# Patient Record
Sex: Male | Born: 1935 | Race: White | Hispanic: No | Marital: Married | Smoking: Former smoker
Health system: Southern US, Community
[De-identification: ages and names within clinical notes are randomized; demographics above are authoritative.]

## PROBLEM LIST (undated history)

## (undated) DIAGNOSIS — F32A Depression, unspecified: Secondary | ICD-10-CM

## (undated) DIAGNOSIS — B059 Measles without complication: Secondary | ICD-10-CM

## (undated) DIAGNOSIS — D649 Anemia, unspecified: Secondary | ICD-10-CM

## (undated) DIAGNOSIS — R55 Syncope and collapse: Secondary | ICD-10-CM

## (undated) DIAGNOSIS — B269 Mumps without complication: Secondary | ICD-10-CM

## (undated) DIAGNOSIS — E669 Obesity, unspecified: Secondary | ICD-10-CM

## (undated) DIAGNOSIS — K759 Inflammatory liver disease, unspecified: Secondary | ICD-10-CM

## (undated) DIAGNOSIS — R7302 Impaired glucose tolerance (oral): Secondary | ICD-10-CM

## (undated) DIAGNOSIS — G4730 Sleep apnea, unspecified: Secondary | ICD-10-CM

## (undated) DIAGNOSIS — J439 Emphysema, unspecified: Secondary | ICD-10-CM

## (undated) DIAGNOSIS — Z8719 Personal history of other diseases of the digestive system: Secondary | ICD-10-CM

## (undated) DIAGNOSIS — J90 Pleural effusion, not elsewhere classified: Secondary | ICD-10-CM

## (undated) DIAGNOSIS — H9190 Unspecified hearing loss, unspecified ear: Secondary | ICD-10-CM

## (undated) DIAGNOSIS — B019 Varicella without complication: Secondary | ICD-10-CM

## (undated) DIAGNOSIS — R011 Cardiac murmur, unspecified: Secondary | ICD-10-CM

## (undated) DIAGNOSIS — E039 Hypothyroidism, unspecified: Secondary | ICD-10-CM

## (undated) DIAGNOSIS — H409 Unspecified glaucoma: Secondary | ICD-10-CM

## (undated) DIAGNOSIS — I4892 Unspecified atrial flutter: Secondary | ICD-10-CM

## (undated) DIAGNOSIS — F329 Major depressive disorder, single episode, unspecified: Secondary | ICD-10-CM

## (undated) DIAGNOSIS — E1169 Type 2 diabetes mellitus with other specified complication: Secondary | ICD-10-CM

## (undated) DIAGNOSIS — I10 Essential (primary) hypertension: Secondary | ICD-10-CM

## (undated) DIAGNOSIS — T4145XA Adverse effect of unspecified anesthetic, initial encounter: Secondary | ICD-10-CM

## (undated) DIAGNOSIS — E559 Vitamin D deficiency, unspecified: Secondary | ICD-10-CM

## (undated) DIAGNOSIS — R001 Bradycardia, unspecified: Secondary | ICD-10-CM

## (undated) DIAGNOSIS — F419 Anxiety disorder, unspecified: Secondary | ICD-10-CM

## (undated) DIAGNOSIS — C801 Malignant (primary) neoplasm, unspecified: Secondary | ICD-10-CM

## (undated) DIAGNOSIS — I5032 Chronic diastolic (congestive) heart failure: Secondary | ICD-10-CM

## (undated) DIAGNOSIS — Z0000 Encounter for general adult medical examination without abnormal findings: Secondary | ICD-10-CM

## (undated) DIAGNOSIS — M1990 Unspecified osteoarthritis, unspecified site: Secondary | ICD-10-CM

## (undated) DIAGNOSIS — K219 Gastro-esophageal reflux disease without esophagitis: Secondary | ICD-10-CM

## (undated) DIAGNOSIS — I2510 Atherosclerotic heart disease of native coronary artery without angina pectoris: Secondary | ICD-10-CM

## (undated) DIAGNOSIS — Z8619 Personal history of other infectious and parasitic diseases: Secondary | ICD-10-CM

## (undated) DIAGNOSIS — I499 Cardiac arrhythmia, unspecified: Secondary | ICD-10-CM

## (undated) DIAGNOSIS — I350 Nonrheumatic aortic (valve) stenosis: Secondary | ICD-10-CM

## (undated) HISTORY — DX: Encounter for general adult medical examination without abnormal findings: Z00.00

## (undated) HISTORY — DX: Unspecified atrial flutter: I48.92

## (undated) HISTORY — PX: EYE SURGERY: SHX253

## (undated) HISTORY — PX: TONSILLECTOMY: SUR1361

## (undated) HISTORY — DX: Hypothyroidism, unspecified: E03.9

## (undated) HISTORY — DX: Nonrheumatic aortic (valve) stenosis: I35.0

## (undated) HISTORY — DX: Pleural effusion, not elsewhere classified: J90

## (undated) HISTORY — PX: JOINT REPLACEMENT: SHX530

## (undated) HISTORY — PX: UPPER GI ENDOSCOPY: SHX6162

## (undated) HISTORY — DX: Mumps without complication: B26.9

## (undated) HISTORY — PX: TOTAL KNEE ARTHROPLASTY: SHX125

## (undated) HISTORY — DX: Anemia, unspecified: D64.9

## (undated) HISTORY — DX: Sleep apnea, unspecified: G47.30

## (undated) HISTORY — DX: Type 2 diabetes mellitus with other specified complication: E11.69

## (undated) HISTORY — DX: Emphysema, unspecified: J43.9

## (undated) HISTORY — PX: CATARACT EXTRACTION W/ INTRAOCULAR LENS  IMPLANT, BILATERAL: SHX1307

## (undated) HISTORY — DX: Obesity, unspecified: E66.9

## (undated) HISTORY — PX: CARDIAC CATHETERIZATION: SHX172

## (undated) HISTORY — DX: Vitamin D deficiency, unspecified: E55.9

## (undated) HISTORY — DX: Impaired glucose tolerance (oral): R73.02

## (undated) HISTORY — PX: WISDOM TOOTH EXTRACTION: SHX21

## (undated) HISTORY — DX: Unspecified osteoarthritis, unspecified site: M19.90

## (undated) HISTORY — DX: Syncope and collapse: R55

## (undated) HISTORY — DX: Chronic diastolic (congestive) heart failure: I50.32

## (undated) HISTORY — DX: Gastro-esophageal reflux disease without esophagitis: K21.9

## (undated) HISTORY — PX: FOOT TENDON SURGERY: SHX958

## (undated) HISTORY — DX: Personal history of other infectious and parasitic diseases: Z86.19

## (undated) HISTORY — PX: CARDIAC VALVE REPLACEMENT: SHX585

## (undated) HISTORY — DX: Varicella without complication: B01.9

## (undated) HISTORY — DX: Bradycardia, unspecified: R00.1

## (undated) HISTORY — DX: Measles without complication: B05.9

## (undated) HISTORY — PX: COLONOSCOPY: SHX174

---
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
## 1988-11-30 HISTORY — PX: TOTAL KNEE ARTHROPLASTY: SHX125

## 1999-02-20 ENCOUNTER — Ambulatory Visit (HOSPITAL_COMMUNITY): Admission: RE | Admit: 1999-02-20 | Discharge: 1999-02-20 | Payer: Self-pay | Admitting: Gastroenterology

## 2001-05-14 ENCOUNTER — Encounter: Payer: Self-pay | Admitting: Urology

## 2001-05-14 ENCOUNTER — Encounter: Admission: RE | Admit: 2001-05-14 | Discharge: 2001-05-14 | Payer: Self-pay | Admitting: Urology

## 2001-07-09 ENCOUNTER — Encounter: Payer: Self-pay | Admitting: Orthopedic Surgery

## 2001-07-13 ENCOUNTER — Inpatient Hospital Stay (HOSPITAL_COMMUNITY): Admission: RE | Admit: 2001-07-13 | Discharge: 2001-07-16 | Payer: Self-pay | Admitting: Orthopedic Surgery

## 2004-03-13 ENCOUNTER — Ambulatory Visit: Payer: Self-pay | Admitting: Internal Medicine

## 2004-06-05 ENCOUNTER — Ambulatory Visit: Payer: Self-pay | Admitting: Internal Medicine

## 2004-06-12 ENCOUNTER — Ambulatory Visit: Payer: Self-pay | Admitting: Internal Medicine

## 2004-08-13 ENCOUNTER — Ambulatory Visit: Payer: Self-pay | Admitting: Internal Medicine

## 2005-05-10 ENCOUNTER — Ambulatory Visit: Payer: Self-pay | Admitting: Internal Medicine

## 2005-06-13 ENCOUNTER — Ambulatory Visit: Payer: Self-pay | Admitting: Internal Medicine

## 2005-07-01 ENCOUNTER — Ambulatory Visit: Payer: Self-pay | Admitting: Internal Medicine

## 2005-07-01 ENCOUNTER — Ambulatory Visit: Payer: Self-pay

## 2005-07-22 ENCOUNTER — Ambulatory Visit: Payer: Self-pay | Admitting: Internal Medicine

## 2005-07-23 ENCOUNTER — Ambulatory Visit: Payer: Self-pay | Admitting: Internal Medicine

## 2005-07-24 ENCOUNTER — Ambulatory Visit: Payer: Self-pay

## 2005-07-30 ENCOUNTER — Ambulatory Visit: Payer: Self-pay | Admitting: Internal Medicine

## 2005-08-06 ENCOUNTER — Ambulatory Visit: Payer: Self-pay | Admitting: Internal Medicine

## 2005-08-19 ENCOUNTER — Ambulatory Visit: Payer: Self-pay | Admitting: Internal Medicine

## 2005-08-23 ENCOUNTER — Ambulatory Visit: Payer: Self-pay

## 2005-08-23 ENCOUNTER — Encounter: Payer: Self-pay | Admitting: Cardiology

## 2005-09-16 ENCOUNTER — Ambulatory Visit: Payer: Self-pay | Admitting: Internal Medicine

## 2005-10-15 ENCOUNTER — Ambulatory Visit: Payer: Self-pay | Admitting: Internal Medicine

## 2005-10-22 ENCOUNTER — Ambulatory Visit: Payer: Self-pay | Admitting: Internal Medicine

## 2005-11-12 ENCOUNTER — Ambulatory Visit: Payer: Self-pay | Admitting: Internal Medicine

## 2005-11-29 ENCOUNTER — Ambulatory Visit: Payer: Self-pay | Admitting: Internal Medicine

## 2005-12-23 ENCOUNTER — Ambulatory Visit: Payer: Self-pay | Admitting: Internal Medicine

## 2005-12-24 ENCOUNTER — Ambulatory Visit: Payer: Self-pay | Admitting: Internal Medicine

## 2005-12-30 ENCOUNTER — Ambulatory Visit: Payer: Self-pay | Admitting: Internal Medicine

## 2005-12-30 ENCOUNTER — Ambulatory Visit: Payer: Self-pay

## 2006-01-15 ENCOUNTER — Ambulatory Visit: Payer: Self-pay | Admitting: Emergency Medicine

## 2006-02-24 ENCOUNTER — Ambulatory Visit: Payer: Self-pay | Admitting: Internal Medicine

## 2006-02-24 LAB — CONVERTED CEMR LAB
ALT: 43 units/L — ABNORMAL HIGH (ref 0–40)
AST: 50 units/L — ABNORMAL HIGH (ref 0–37)
Albumin: 4.1 g/dL (ref 3.5–5.2)
Alkaline Phosphatase: 33 units/L — ABNORMAL LOW (ref 39–117)
Bilirubin, Direct: 0.1 mg/dL (ref 0.0–0.3)
Chol/HDL Ratio, serum: 3.7
Cholesterol: 224 mg/dL (ref 0–200)
HDL: 60.3 mg/dL (ref >39.0)
LDL DIRECT: 148.9 mg/dL
TSH: 3.09 microintl units/mL (ref 0.35–5.50)
Total Bilirubin: 0.8 mg/dL (ref 0.3–1.2)
Total Protein: 7.3 g/dL (ref 6.0–8.3)
Triglyceride fasting, serum: 55 mg/dL (ref 0–149)
VLDL: 11 mg/dL (ref 0–40)

## 2006-02-27 ENCOUNTER — Ambulatory Visit (HOSPITAL_BASED_OUTPATIENT_CLINIC_OR_DEPARTMENT_OTHER): Admission: RE | Admit: 2006-02-27 | Discharge: 2006-02-27 | Payer: Self-pay | Admitting: Emergency Medicine

## 2006-03-11 ENCOUNTER — Ambulatory Visit: Payer: Self-pay | Admitting: Emergency Medicine

## 2006-03-20 ENCOUNTER — Ambulatory Visit: Payer: Self-pay | Admitting: Pulmonary Disease

## 2006-04-11 ENCOUNTER — Ambulatory Visit: Payer: Self-pay | Admitting: Internal Medicine

## 2006-05-02 ENCOUNTER — Ambulatory Visit: Payer: Self-pay | Admitting: Internal Medicine

## 2006-05-05 ENCOUNTER — Ambulatory Visit: Payer: Self-pay | Admitting: Internal Medicine

## 2006-05-06 ENCOUNTER — Ambulatory Visit: Payer: Self-pay | Admitting: Emergency Medicine

## 2006-06-25 ENCOUNTER — Ambulatory Visit: Payer: Self-pay | Admitting: Internal Medicine

## 2006-07-14 ENCOUNTER — Ambulatory Visit: Payer: Self-pay | Admitting: Internal Medicine

## 2006-08-15 ENCOUNTER — Ambulatory Visit: Payer: Self-pay | Admitting: Cardiology

## 2006-09-03 ENCOUNTER — Ambulatory Visit: Payer: Self-pay

## 2006-09-03 ENCOUNTER — Ambulatory Visit: Payer: Self-pay | Admitting: Internal Medicine

## 2006-09-03 ENCOUNTER — Encounter: Payer: Self-pay | Admitting: Internal Medicine

## 2006-09-03 LAB — CONVERTED CEMR LAB
ALT: 35 units/L (ref 0–40)
AST: 43 units/L — ABNORMAL HIGH (ref 0–37)
Albumin: 3.7 g/dL (ref 3.5–5.2)
Alkaline Phosphatase: 44 units/L (ref 39–117)
Bilirubin, Direct: 0.1 mg/dL (ref 0.0–0.3)
CK-MB: 13.4 ng/mL (ref 0.3–4.0)
Total Bilirubin: 0.7 mg/dL (ref 0.3–1.2)
Total CK: 404 units/L (ref 7–195)
Total Protein: 6.6 g/dL (ref 6.0–8.3)

## 2006-09-23 DIAGNOSIS — E785 Hyperlipidemia, unspecified: Secondary | ICD-10-CM | POA: Insufficient documentation

## 2006-09-23 DIAGNOSIS — I10 Essential (primary) hypertension: Secondary | ICD-10-CM | POA: Insufficient documentation

## 2006-09-23 DIAGNOSIS — E039 Hypothyroidism, unspecified: Secondary | ICD-10-CM | POA: Insufficient documentation

## 2006-09-23 DIAGNOSIS — K219 Gastro-esophageal reflux disease without esophagitis: Secondary | ICD-10-CM | POA: Insufficient documentation

## 2006-09-23 DIAGNOSIS — F418 Other specified anxiety disorders: Secondary | ICD-10-CM | POA: Insufficient documentation

## 2006-09-26 ENCOUNTER — Ambulatory Visit: Payer: Self-pay | Admitting: Internal Medicine

## 2006-10-28 ENCOUNTER — Ambulatory Visit: Payer: Self-pay | Admitting: Internal Medicine

## 2007-01-21 ENCOUNTER — Ambulatory Visit: Payer: Self-pay

## 2007-04-09 ENCOUNTER — Ambulatory Visit: Payer: Self-pay | Admitting: Internal Medicine

## 2007-05-04 ENCOUNTER — Ambulatory Visit: Payer: Self-pay | Admitting: Internal Medicine

## 2007-05-04 LAB — CONVERTED CEMR LAB
ALT: 32 units/L (ref 0–53)
AST: 34 units/L (ref 0–37)
Albumin: 3.9 g/dL (ref 3.5–5.2)
Alkaline Phosphatase: 36 units/L — ABNORMAL LOW (ref 39–117)
BUN: 16 mg/dL (ref 6–23)
Bilirubin, Direct: 0.2 mg/dL (ref 0.0–0.3)
CO2: 31 meq/L (ref 19–32)
CRP, High Sensitivity: 1 (ref 0.00–5.00)
Calcium: 9 mg/dL (ref 8.4–10.5)
Chloride: 104 meq/L (ref 96–112)
Cholesterol: 170 mg/dL (ref 0–200)
Creatinine, Ser: 1 mg/dL (ref 0.4–1.5)
GFR calc Af Amer: 95 mL/min
GFR calc non Af Amer: 79 mL/min
Glucose, Bld: 107 mg/dL — ABNORMAL HIGH (ref 70–99)
HDL: 60.8 mg/dL (ref >39.0)
LDL Cholesterol: 99 mg/dL (ref 0–99)
Potassium: 3.9 meq/L (ref 3.5–5.1)
Sodium: 141 meq/L (ref 135–145)
Total Bilirubin: 0.7 mg/dL (ref 0.3–1.2)
Total CHOL/HDL Ratio: 2.8
Total Protein: 6.7 g/dL (ref 6.0–8.3)
Triglycerides: 53 mg/dL (ref 0–149)
VLDL: 11 mg/dL (ref 0–40)

## 2007-08-17 ENCOUNTER — Ambulatory Visit: Payer: Self-pay | Admitting: Internal Medicine

## 2007-08-17 DIAGNOSIS — M1990 Unspecified osteoarthritis, unspecified site: Secondary | ICD-10-CM | POA: Insufficient documentation

## 2007-08-19 ENCOUNTER — Telehealth: Payer: Self-pay | Admitting: Internal Medicine

## 2008-02-15 ENCOUNTER — Ambulatory Visit: Payer: Self-pay | Admitting: Internal Medicine

## 2008-04-12 ENCOUNTER — Ambulatory Visit: Payer: Self-pay | Admitting: Internal Medicine

## 2008-04-12 LAB — CONVERTED CEMR LAB
ALT: 41 units/L (ref 0–53)
AST: 37 units/L (ref 0–37)
Albumin: 3.7 g/dL (ref 3.5–5.2)
Alkaline Phosphatase: 49 units/L (ref 39–117)
Bilirubin, Direct: 0.1 mg/dL (ref 0.0–0.3)
Cholesterol: 192 mg/dL (ref 0–200)
HDL: 56.2 mg/dL (ref >39.0)
LDL Cholesterol: 122 mg/dL — ABNORMAL HIGH (ref 0–99)
Total Bilirubin: 0.6 mg/dL (ref 0.3–1.2)
Total CHOL/HDL Ratio: 3.4
Total Protein: 6.9 g/dL (ref 6.0–8.3)
Triglycerides: 70 mg/dL (ref 0–149)
VLDL: 14 mg/dL (ref 0–40)

## 2008-04-18 ENCOUNTER — Ambulatory Visit: Payer: Self-pay | Admitting: Internal Medicine

## 2008-04-18 LAB — CONVERTED CEMR LAB
Cholesterol, target level: 200 mg/dL
HDL goal, serum: 40 mg/dL
LDL Goal: 160 mg/dL

## 2008-05-24 ENCOUNTER — Ambulatory Visit: Payer: Self-pay | Admitting: Internal Medicine

## 2008-05-24 LAB — CONVERTED CEMR LAB
Free T4: 0.8 ng/dL (ref 0.6–1.6)
T3, Free: 3.4 pg/mL (ref 2.3–4.2)
TSH: 6.63 microintl units/mL — ABNORMAL HIGH (ref 0.35–5.50)

## 2008-05-26 ENCOUNTER — Ambulatory Visit: Payer: Self-pay

## 2008-05-26 ENCOUNTER — Ambulatory Visit: Payer: Self-pay | Admitting: Internal Medicine

## 2008-05-26 ENCOUNTER — Encounter: Payer: Self-pay | Admitting: Internal Medicine

## 2008-05-31 ENCOUNTER — Ambulatory Visit: Payer: Self-pay | Admitting: Internal Medicine

## 2008-06-07 ENCOUNTER — Ambulatory Visit: Payer: Self-pay | Admitting: Cardiology

## 2008-06-14 ENCOUNTER — Ambulatory Visit: Payer: Self-pay | Admitting: Internal Medicine

## 2008-06-14 ENCOUNTER — Telehealth: Payer: Self-pay | Admitting: Internal Medicine

## 2008-06-22 ENCOUNTER — Encounter: Payer: Self-pay | Admitting: Internal Medicine

## 2008-06-22 ENCOUNTER — Ambulatory Visit: Payer: Self-pay | Admitting: Internal Medicine

## 2008-06-29 ENCOUNTER — Ambulatory Visit: Payer: Self-pay | Admitting: Cardiology

## 2008-06-30 ENCOUNTER — Ambulatory Visit: Payer: Self-pay | Admitting: Internal Medicine

## 2008-06-30 HISTORY — PX: CARDIAC ELECTROPHYSIOLOGY MAPPING AND ABLATION: SHX1292

## 2008-06-30 LAB — CONVERTED CEMR LAB
BUN: 13 mg/dL (ref 6–23)
Basophils Absolute: 0 10*3/uL (ref 0.0–0.1)
Basophils Relative: 0.5 % (ref 0.0–3.0)
CO2: 29 meq/L (ref 19–32)
Calcium: 8.8 mg/dL (ref 8.4–10.5)
Chloride: 103 meq/L (ref 96–112)
Creatinine, Ser: 0.9 mg/dL (ref 0.4–1.5)
Eosinophils Absolute: 0.3 10*3/uL (ref 0.0–0.7)
Eosinophils Relative: 6.8 % — ABNORMAL HIGH (ref 0.0–5.0)
GFR calc non Af Amer: 88.4 mL/min (ref >60)
Glucose, Bld: 194 mg/dL — ABNORMAL HIGH (ref 70–99)
HCT: 37.7 % — ABNORMAL LOW (ref 39.0–52.0)
Hemoglobin: 13 g/dL (ref 13.0–17.0)
INR: 2.1 — ABNORMAL HIGH (ref 0.8–1.0)
Lymphocytes Relative: 34.4 % (ref 12.0–46.0)
Lymphs Abs: 1.7 10*3/uL (ref 0.7–4.0)
MCHC: 34.6 g/dL (ref 30.0–36.0)
MCV: 87.2 fL (ref 78.0–100.0)
Monocytes Absolute: 0.4 10*3/uL (ref 0.1–1.0)
Monocytes Relative: 7.6 % (ref 3.0–12.0)
Neutro Abs: 2.6 10*3/uL (ref 1.4–7.7)
Neutrophils Relative %: 50.7 % (ref 43.0–77.0)
Platelets: 174 10*3/uL (ref 150.0–400.0)
Potassium: 3.7 meq/L (ref 3.5–5.1)
Prothrombin Time: 21.5 s — ABNORMAL HIGH (ref 10.9–13.3)
RBC: 4.32 M/uL (ref 4.22–5.81)
RDW: 14.4 % (ref 11.5–14.6)
Sodium: 138 meq/L (ref 135–145)
WBC: 5 10*3/uL (ref 4.5–10.5)
aPTT: 33.6 s — ABNORMAL HIGH (ref 21.7–28.8)

## 2008-07-07 ENCOUNTER — Ambulatory Visit (HOSPITAL_COMMUNITY): Admission: RE | Admit: 2008-07-07 | Discharge: 2008-07-08 | Payer: Self-pay | Admitting: Internal Medicine

## 2008-07-07 ENCOUNTER — Ambulatory Visit: Payer: Self-pay | Admitting: Internal Medicine

## 2008-07-08 ENCOUNTER — Encounter: Payer: Self-pay | Admitting: Internal Medicine

## 2008-07-13 ENCOUNTER — Ambulatory Visit: Payer: Self-pay | Admitting: Cardiovascular Disease

## 2008-07-18 ENCOUNTER — Ambulatory Visit: Payer: Self-pay | Admitting: Internal Medicine

## 2008-08-10 ENCOUNTER — Ambulatory Visit: Payer: Self-pay | Admitting: Cardiology

## 2008-08-12 ENCOUNTER — Ambulatory Visit: Payer: Self-pay | Admitting: Internal Medicine

## 2008-08-12 LAB — CONVERTED CEMR LAB
ALT: 35 units/L (ref 0–53)
AST: 44 units/L — ABNORMAL HIGH (ref 0–37)
Albumin: 4 g/dL (ref 3.5–5.2)
Alkaline Phosphatase: 48 units/L (ref 39–117)
Bilirubin, Direct: 0.2 mg/dL (ref 0.0–0.3)
Cholesterol: 196 mg/dL (ref 0–200)
HDL: 57.2 mg/dL (ref >39.00)
LDL Cholesterol: 123 mg/dL — ABNORMAL HIGH (ref 0–99)
Total Bilirubin: 0.8 mg/dL (ref 0.3–1.2)
Total CHOL/HDL Ratio: 3
Total Protein: 7.3 g/dL (ref 6.0–8.3)
Triglycerides: 77 mg/dL (ref 0.0–149.0)
VLDL: 15.4 mg/dL (ref 0.0–40.0)

## 2008-08-17 ENCOUNTER — Ambulatory Visit: Payer: Self-pay | Admitting: Internal Medicine

## 2008-08-23 ENCOUNTER — Ambulatory Visit: Payer: Self-pay | Admitting: Internal Medicine

## 2008-08-29 DIAGNOSIS — G4733 Obstructive sleep apnea (adult) (pediatric): Secondary | ICD-10-CM | POA: Insufficient documentation

## 2008-08-30 ENCOUNTER — Ambulatory Visit: Payer: Self-pay | Admitting: Internal Medicine

## 2008-08-31 ENCOUNTER — Encounter: Payer: Self-pay | Admitting: *Deleted

## 2008-09-14 ENCOUNTER — Telehealth: Payer: Self-pay | Admitting: Internal Medicine

## 2008-09-14 LAB — CONVERTED CEMR LAB
Free T4: 0.9 ng/dL (ref 0.6–1.6)
TSH: 2.23 microintl units/mL (ref 0.35–5.50)

## 2008-10-05 ENCOUNTER — Encounter: Payer: Self-pay | Admitting: *Deleted

## 2008-10-11 ENCOUNTER — Ambulatory Visit: Payer: Self-pay | Admitting: Internal Medicine

## 2008-11-11 ENCOUNTER — Encounter: Payer: Self-pay | Admitting: Cardiology

## 2008-11-14 ENCOUNTER — Encounter (INDEPENDENT_AMBULATORY_CARE_PROVIDER_SITE_OTHER): Payer: Self-pay | Admitting: *Deleted

## 2008-11-22 ENCOUNTER — Ambulatory Visit: Payer: Self-pay | Admitting: Internal Medicine

## 2008-11-22 DIAGNOSIS — I4892 Unspecified atrial flutter: Secondary | ICD-10-CM

## 2008-12-06 ENCOUNTER — Encounter (INDEPENDENT_AMBULATORY_CARE_PROVIDER_SITE_OTHER): Payer: Self-pay | Admitting: *Deleted

## 2009-01-11 ENCOUNTER — Ambulatory Visit: Payer: Self-pay | Admitting: Internal Medicine

## 2009-03-09 ENCOUNTER — Encounter: Payer: Self-pay | Admitting: Internal Medicine

## 2009-03-13 ENCOUNTER — Ambulatory Visit: Payer: Self-pay | Admitting: Internal Medicine

## 2009-03-13 DIAGNOSIS — R001 Bradycardia, unspecified: Secondary | ICD-10-CM

## 2009-04-03 ENCOUNTER — Ambulatory Visit: Payer: Self-pay | Admitting: Internal Medicine

## 2009-04-03 LAB — CONVERTED CEMR LAB
ALT: 32 units/L (ref 0–53)
AST: 32 units/L (ref 0–37)
Alkaline Phosphatase: 47 units/L (ref 39–117)
BUN: 16 mg/dL (ref 6–23)
Basophils Relative: 0.8 % (ref 0.0–3.0)
Bilirubin, Direct: 0 mg/dL (ref 0.0–0.3)
Chloride: 105 meq/L (ref 96–112)
Creatinine, Ser: 0.9 mg/dL (ref 0.4–1.5)
Eosinophils Relative: 4.4 % (ref 0.0–5.0)
GFR calc non Af Amer: 88.21 mL/min (ref >60)
LDL Cholesterol: 116 mg/dL — ABNORMAL HIGH (ref 0–99)
MCV: 90.9 fL (ref 78.0–100.0)
Monocytes Absolute: 0.4 10*3/uL (ref 0.1–1.0)
Monocytes Relative: 7.3 % (ref 3.0–12.0)
Neutrophils Relative %: 60.1 % (ref 43.0–77.0)
Nitrite: NEGATIVE
Platelets: 182 10*3/uL (ref 150.0–400.0)
Potassium: 3.9 meq/L (ref 3.5–5.1)
Protein, U semiquant: NEGATIVE
RBC: 4.72 M/uL (ref 4.22–5.81)
Total Bilirubin: 0.8 mg/dL (ref 0.3–1.2)
Total CHOL/HDL Ratio: 3
Total Protein: 7.1 g/dL (ref 6.0–8.3)
Triglycerides: 76 mg/dL (ref 0.0–149.0)
Urobilinogen, UA: 0.2
VLDL: 15.2 mg/dL (ref 0.0–40.0)
WBC: 5.3 10*3/uL (ref 4.5–10.5)

## 2009-04-10 ENCOUNTER — Ambulatory Visit: Payer: Self-pay | Admitting: Internal Medicine

## 2009-04-10 DIAGNOSIS — E1169 Type 2 diabetes mellitus with other specified complication: Secondary | ICD-10-CM

## 2009-04-10 DIAGNOSIS — E669 Obesity, unspecified: Secondary | ICD-10-CM

## 2009-04-10 HISTORY — DX: Obesity, unspecified: E11.69

## 2009-04-10 HISTORY — DX: Obesity, unspecified: E66.9

## 2009-04-13 ENCOUNTER — Telehealth: Payer: Self-pay | Admitting: Internal Medicine

## 2009-07-04 ENCOUNTER — Ambulatory Visit: Payer: Self-pay | Admitting: Internal Medicine

## 2009-07-13 ENCOUNTER — Encounter (INDEPENDENT_AMBULATORY_CARE_PROVIDER_SITE_OTHER): Payer: Self-pay | Admitting: *Deleted

## 2009-07-13 ENCOUNTER — Ambulatory Visit: Payer: Self-pay | Admitting: Internal Medicine

## 2009-07-13 LAB — CONVERTED CEMR LAB
BUN: 20 mg/dL (ref 6–23)
CO2: 31 meq/L (ref 19–32)
Chloride: 102 meq/L (ref 96–112)
Cholesterol: 161 mg/dL (ref 0–200)
GFR calc non Af Amer: 69.92 mL/min (ref >60)
Glucose, Bld: 107 mg/dL — ABNORMAL HIGH (ref 70–99)
Potassium: 4 meq/L (ref 3.5–5.1)
Sodium: 142 meq/L (ref 135–145)
TSH: 1.09 microintl units/mL (ref 0.35–5.50)

## 2009-09-06 ENCOUNTER — Ambulatory Visit: Payer: Self-pay | Admitting: Internal Medicine

## 2009-10-11 ENCOUNTER — Encounter: Payer: Self-pay | Admitting: Internal Medicine

## 2009-10-12 ENCOUNTER — Ambulatory Visit: Payer: Self-pay | Admitting: Internal Medicine

## 2010-02-01 ENCOUNTER — Ambulatory Visit: Payer: Self-pay | Admitting: Internal Medicine

## 2010-02-01 LAB — CONVERTED CEMR LAB
ALT: 31 units/L (ref 0–53)
Albumin: 4.1 g/dL (ref 3.5–5.2)
Bilirubin, Direct: 0.1 mg/dL (ref 0.0–0.3)
Cholesterol: 224 mg/dL — ABNORMAL HIGH (ref 0–200)
HDL: 88.5 mg/dL (ref >39.00)
Hgb A1c MFr Bld: 6 % (ref 4.6–6.5)
Total Protein: 6.8 g/dL (ref 6.0–8.3)
VLDL: 13.6 mg/dL (ref 0.0–40.0)

## 2010-02-08 ENCOUNTER — Ambulatory Visit: Payer: Self-pay | Admitting: Internal Medicine

## 2010-04-29 LAB — CONVERTED CEMR LAB
ALT: 33 units/L (ref 0–53)
AST: 35 units/L (ref 0–37)
Albumin: 4.1 g/dL (ref 3.5–5.2)
Alkaline Phosphatase: 51 units/L (ref 39–117)
BUN: 21 mg/dL (ref 6–23)
Bilirubin, Direct: 0.1 mg/dL (ref 0.0–0.3)
CO2: 30 meq/L (ref 19–32)
Calcium: 9.3 mg/dL (ref 8.4–10.5)
Chloride: 102 meq/L (ref 96–112)
Cholesterol: 175 mg/dL (ref 0–200)
Creatinine, Ser: 1.1 mg/dL (ref 0.4–1.5)
GFR calc Af Amer: 85 mL/min
GFR calc non Af Amer: 71 mL/min
Glucose, Bld: 109 mg/dL — ABNORMAL HIGH (ref 70–99)
HDL: 50.1 mg/dL (ref >39.0)
LDL Cholesterol: 114 mg/dL — ABNORMAL HIGH (ref 0–99)
Potassium: 4.4 meq/L (ref 3.5–5.1)
Sodium: 139 meq/L (ref 135–145)
Total Bilirubin: 0.8 mg/dL (ref 0.3–1.2)
Total CHOL/HDL Ratio: 3.5
Total CK: 194 units/L (ref 7–195)
Total Protein: 6.8 g/dL (ref 6.0–8.3)
Triglycerides: 56 mg/dL (ref 0–149)
VLDL: 11 mg/dL (ref 0–40)

## 2010-05-01 NOTE — Assessment & Plan Note (Signed)
Summary: CPX//SLM   Vital Signs:  Patient profile:   75 year old male Height:      75 inches Weight:      270 pounds BMI:     33.87 Temp:     98.2 degrees F oral Pulse rate:   68 / minute Resp:     14 per minute BP sitting:   132 / 80  (left arm)  Vitals Entered By: Willy Eddy, LPN (April 10, 2009 9:00 AM) CC: annual visit for disease management   Primary Care Provider:  Stacie Glaze MD  CC:  annual visit for disease management.  History of Present Illness: has lost some weight has been dieting The pt was asked about all immunizations, health maint. services that are appropriate to their age and was given guidance on diet exercize  and weight management   Preventive Screening-Counseling & Management  Alcohol-Tobacco     Smoking Status: quit     Year Quit: 1980  Problems Prior to Update: 1)  Bradycardia  (ICD-427.89) 2)  Sleep Apnea  (ICD-780.57) 3)  Hx of Atrial Flutter  (ICD-427.32) 4)  Osteoarthritis  (ICD-715.90) 5)  Hypothyroidism  (ICD-244.9) 6)  Hypertension  (ICD-401.9) 7)  Hyperlipidemia  (ICD-272.4) 8)  Gerd  (ICD-530.81) 9)  Depression  (ICD-311)  Medications Prior to Update: 1)  Synthroid 137 Mcg Tabs (Levothyroxine Sodium) .[name].. 1 Once Daily 2)  Hydrochlorothiazide 12.5 Mg  Tabs (Hydrochlorothiazide) .... Once Daily 3)  Amlodipine Besylate 10 Mg Tabs (Amlodipine Besylate) .[name].. 1 Once Daily 4)  Omeprazole 20 Mg  Cpdr (Omeprazole) .... Once Daily 5)  Proscar 5 Mg  Tabs (Finasteride) .... Once Daily 6)  Fish Oil   Caps (Omega-3 Fatty Acids Caps) .... Once Daily 7)  Clonidine Hcl 0.1 Mg Tabs (Clonidine Hcl) .... Take One Tablet By Mouth Twice A Day 8)  Paroxetine Hcl 40 Mg Tabs (Paroxetine Hcl) .[name].. 1 Once Daily 9)  Diclofenac Sodium 75 Mg Tbec (Diclofenac Sodium) .[name].. 1 Two Times A Day 10)  Crestor 5 Mg Tabs (Rosuvastatin Calcium) .... M,w,f, 11)  Aspirin 81 Mg Tbec (Aspirin) .... Take One Tablet By Mouth Daily 12)  Benazepril Hcl 40 Mg Tabs  (Benazepril Hcl) .[name].. 1 Once Daily  Current Medications (verified): 1)  Synthroid 137 Mcg Tabs (Levothyroxine Sodium) .[name].. 1 Once Daily 2)  Hydrochlorothiazide 12.5 Mg  Tabs (Hydrochlorothiazide) .... Once Daily 3)  Amlodipine Besylate 10 Mg Tabs (Amlodipine Besylate) .[name].. 1 Once Daily 4)  Omeprazole 20 Mg  Cpdr (Omeprazole) .... Once Daily 5)  Proscar 5 Mg  Tabs (Finasteride) .... Once Daily 6)  Fish Oil   Caps (Omega-3 Fatty Acids Caps) .... Once Daily 7)  Clonidine Hcl 0.1 Mg Tabs (Clonidine Hcl) .... 1/2 Two Times A Day 8)  Paroxetine Hcl 40 Mg Tabs (Paroxetine Hcl) .[name].. 1 Once Daily 9)  Diclofenac Sodium 75 Mg Tbec (Diclofenac Sodium) .[name].. 1 Two Times A Day 10)  Crestor 5 Mg Tabs (Rosuvastatin Calcium) .... M,w,f, 11)  Aspirin 81 Mg Tbec (Aspirin) .... Take One Tablet By Mouth Daily 12)  Benazepril Hcl 40 Mg Tabs (Benazepril Hcl) .[name].. 1 Once Daily  Allergies (verified): 1)  ! Codeine 2)  ! * No Latex Allergy 3)  ! * No Ivp Dye Allergy 4)  ! * No Shellfish Allergy 5)  ! * Statins 6)  * Feldene Pyroxicam  Past History:  Family History: Last updated: 09/23/2006 Fam hx CHF Fam hx MS Family History Hypertension  Social History:  Last updated: 05/23/2008 Former Smoker Retired  Married  Alcohol Use - no Drug Use - no  Risk Factors: Smoking Status: quit (04/10/2009)  Past medical, surgical, family and social histories (including risk factors) reviewed, and no changes noted (except as noted below).  Past Medical History: Reviewed history from 08/30/2008 and no changes required. 1.Atrial flutter     --s/p ablation 07/17/2008    --echo: EF 60-65% trivial AI 2. Sleep apnea 3. Hyperlipidemia    --h/o myostitis with statins 4. Hypertension 5. Hypothyroidism 6. GERD 7. Osteoarthritis 8. Chronic dyspnea. 9. Bradycardia 10. Glucose intolerance 11. h/o micturition syncope 12. h/o negative Myoview 4/07  Family History: Reviewed history from 09/23/2006 and no changes  required. Fam hx CHF Fam hx MS Family History Hypertension  Social History: Reviewed history from 05/23/2008 and no changes required. Former Smoker Retired  Married  Alcohol Use - no Drug Use - no  Review of Systems  The patient denies anorexia, fever, weight loss, weight gain, vision loss, decreased hearing, hoarseness, chest pain, syncope, dyspnea on exertion, peripheral edema, prolonged cough, headaches, hemoptysis, abdominal pain, melena, hematochezia, severe indigestion/heartburn, hematuria, incontinence, genital sores, muscle weakness, suspicious skin lesions, transient blindness, difficulty walking, depression, unusual weight change, abnormal bleeding, enlarged lymph nodes, angioedema, breast masses, and testicular masses.    Physical Exam  General:  Gen: well appearing. no resp difficulty HEENT: normal Neck: supple. no JVD. Carotids 2+ bilat; not bruits. No lymphadenopathy or thryomegaly appreciated. Cor: PMI nondisplaced. Regular rate & rhythm. No rubs, gallops. 2/6 systolic murmur RSB Lungs: clear Abdomen: soft, nontender, nondistended. No hepatosplenomegaly. No bruits or masses. Good bowel sounds. Extremities: no cyanosis, clubbing, rash, edema Neuro: alert & orientedx3, cranial nerves grossly intact. moves all 4 extremities w/o difficulty. affect pleasant  Head:  normocephalic and atraumatic Ears:  R ear normal and L ear normal.   Nose:  no external deformity and no nasal discharge.   Mouth:  pharynx pink and moist and no erythema.   Neck:  No deformities, masses, or tenderness noted. Lungs:  normal respiratory effort and no wheezes.   Heart:  normal rate and regular rhythm.   Abdomen:  Bowel sounds positive; abdomen soft and non-tender without masses, organomegaly, or hernias noted. No hepatosplenomegaly. Msk:  normal ROM.   Extremities:  trace left pedal edema and trace right pedal edema.   Neurologic:  alert & oriented X3 and DTRs symmetrical and normal.   Skin:   Intact without suspicious lesions or rashes Cervical Nodes:  No lymphadenopathy noted Axillary Nodes:  No palpable lymphadenopathy Psych:  Cognition and judgment appear intact. Alert and cooperative with normal attention span and concentration. No apparent delusions, illusions, hallucinations   Impression & Recommendations:  Problem # 1:  PREVENTIVE HEALTH CARE (ICD-V70.0) new problem diabetes The pt was asked about all immunizations, health maint. services that are appropriate to their age and was given guidance on diet exercize  and weight management  Td Booster: Historical (04/30/2008)   Flu Vax: Fluvax 3+ (01/11/2009)   Pneumovax: Historical (10/29/2008) Chol: 195 (04/03/2009)   HDL: 63.90 (04/03/2009)   LDL: 116 (04/03/2009)   TG: 76.0 (04/03/2009) TSH: 1.85 (04/03/2009)   HgbA1C: 6.4 (08/23/2008)   PSA: 1.58 (04/03/2009)  Discussed using sunscreen, use of alcohol, drug use, self testicular exam, routine dental care, routine eye care, routine physical exam, seat belts, multiple vitamins, osteoporosis prevention, adequate calcium intake in diet, and recommendations for immunizations.  Discussed exercise and checking cholesterol.  Discussed gun safety, safe sex, and  contraception. Also recommend checking PSA.  Problem # 2:  IMPAIRED FASTING GLUCOSE (ICD-790.21)  Labs Reviewed: Creat: 0.9 (04/03/2009)     Orders: TLB-A1C / Hgb A1C (Glycohemoglobin) (83036-A1C)  Complete Medication List: 1)  Synthroid 137 Mcg Tabs (Levothyroxine sodium) .[name].. 1 once daily 2)  Hydrochlorothiazide 12.5 Mg Tabs (Hydrochlorothiazide) .... Once daily 3)  Amlodipine Besylate 10 Mg Tabs (Amlodipine besylate) .[name].. 1 once daily 4)  Omeprazole 20 Mg Cpdr (Omeprazole) .... Once daily 5)  Proscar 5 Mg Tabs (Finasteride) .... Once daily 6)  Fish Oil Caps (Omega-3 fatty acids caps) .... Once daily 7)  Clonidine Hcl 0.1 Mg Tabs (Clonidine hcl) .... 1/2 two times a day 8)  Paroxetine Hcl 40 Mg Tabs (Paroxetine hcl)  .[name].. 1 once daily 9)  Diclofenac Sodium 75 Mg Tbec (Diclofenac sodium) .[name].. 1 two times a day 10)  Crestor 5 Mg Tabs (Rosuvastatin calcium) .... M,w,f, 11)  Aspirin 81 Mg Tbec (Aspirin) .... Take one tablet by mouth daily 12)  Benazepril Hcl 40 Mg Tabs (Benazepril hcl) .[name].. 1 once daily  Patient Instructions: 1)  moderated sweets 2)  limit is frequency and amount 3)  cut out the "accessible sweets" 4)  buy single serving of ice cream and dessert. 5)  Please schedule a follow-up appointment in 3 months. 6)  HbgA1C prior to visit, ICD-9:790.21       Immunization History:  Tetanus/Td Immunization History:    Tetanus/Td:  historical (04/30/2008)  Pneumovax Immunization History:    Pneumovax:  historical (10/29/2008)    Appended Document: Orders Update    Clinical Lists Changes  Orders: Added new Service order of Venipuncture [phone]) - Signed

## 2010-05-01 NOTE — Assessment & Plan Note (Signed)
Summary: 3 month fup//ccm   Vital Signs:  Patient profile:   75 year old male Height:      75 inches Weight:      220 pounds BMI:     27.60 Temp:     98.2 degrees F oral Pulse rate:   64 / minute Resp:     14 per minute BP sitting:   130 / 80  (left arm)  Vitals Entered By: Willy Eddy, LPN (October 12, 2009 9:09 AM) CC: roa   Primary Care Provider:  Stacie Glaze MD  CC:  roa.  History of Present Illness:  Hypertension Follow-Up      This is a 75 year old man who presents for Hypertension follow-up.  The patient denies lightheadedness, urinary frequency, headaches, edema, impotence, rash, and fatigue.  The patient denies the following associated symptoms: chest pain, chest pressure, exercise intolerance, dyspnea, palpitations, syncope, leg edema, and pedal edema.  Compliance with medications (by patient report) has been near 100%.  The patient reports that dietary compliance has been excellent.  The patient reports exercising daily.  Adjunctive measures currently used by the patient include salt restriction.    Preventive Screening-Counseling & Management  Alcohol-Tobacco     Smoking Status: quit     Year Quit: 1980  Problems Prior to Update: 1)  Impaired Fasting Glucose  (ICD-790.21) 2)  Preventive Health Care  (ICD-V70.0) 3)  Bradycardia  (ICD-427.89) 4)  Sleep Apnea  (ICD-780.57) 5)  Hx of Atrial Flutter  (ICD-427.32) 6)  Osteoarthritis  (ICD-715.90) 7)  Hypothyroidism  (ICD-244.9) 8)  Hypertension  (ICD-401.9) 9)  Hyperlipidemia  (ICD-272.4) 10)  Gerd  (ICD-530.81) 11)  Depression  (ICD-311)  Current Problems (verified): 1)  Impaired Fasting Glucose  (ICD-790.21) 2)  Preventive Health Care  (ICD-V70.0) 3)  Bradycardia  (ICD-427.89) 4)  Sleep Apnea  (ICD-780.57) 5)  Hx of Atrial Flutter  (ICD-427.32) 6)  Osteoarthritis  (ICD-715.90) 7)  Hypothyroidism  (ICD-244.9) 8)  Hypertension  (ICD-401.9) 9)  Hyperlipidemia  (ICD-272.4) 10)  Gerd  (ICD-530.81) 11)   Depression  (ICD-311)  Medications Prior to Update: 1)  Synthroid 137 Mcg Tabs (Levothyroxine Sodium) .[name].. 1 Once Daily 2)  Amlodipine Besylate 10 Mg Tabs (Amlodipine Besylate) .[name].. 1 Once Daily 3)  Omeprazole 20 Mg  Cpdr (Omeprazole) .... Once Daily 4)  Proscar 5 Mg  Tabs (Finasteride) .... Once Daily 5)  Fish Oil   Caps (Omega-3 Fatty Acids Caps) .... Once Daily 6)  Paroxetine Hcl 40 Mg Tabs (Paroxetine Hcl) .[name].. 1 Once Daily 7)  Diclofenac Sodium 75 Mg Tbec (Diclofenac Sodium) .[name].. 1 Two Times A Day 8)  Crestor 5 Mg Tabs (Rosuvastatin Calcium) .... M,w,f, 9)  Aspirin 81 Mg Tbec (Aspirin) .... Take One Tablet By Mouth Daily 10)  Benazepril Hcl 40 Mg Tabs (Benazepril Hcl) .[name].. 1 Once Daily 11)  Cinnamon 500 Mg Caps (Cinnamon) .... One Capsule in The Morning and One Capsule in The Evening 12)  Centrum Silver  Tabs (Multiple Vitamins-Minerals) .... Once Daily  Current Medications (verified): 1)  Synthroid 137 Mcg Tabs (Levothyroxine Sodium) .[name].. 1 Once Daily 2)  Amlodipine Besylate 10 Mg Tabs (Amlodipine Besylate) .[name].. 1 Once Daily 3)  Omeprazole 20 Mg  Cpdr (Omeprazole) .... Once Daily 4)  Proscar 5 Mg  Tabs (Finasteride) .... Once Daily 5)  Fish Oil   Caps (Omega-3 Fatty Acids Caps) .... Once Daily 6)  Paroxetine Hcl 40 Mg Tabs (Paroxetine Hcl) .[name].. 1 Once Daily 7)  Diclofenac Sodium 75 Mg Tbec (Diclofenac Sodium) .[name].. 1 Two Times A Day 8)  Crestor 5 Mg Tabs (Rosuvastatin Calcium) .... M,w,f, 9)  Aspirin 81 Mg Tbec (Aspirin) .... Take One Tablet By Mouth Daily 10)  Benazepril Hcl 40 Mg Tabs (Benazepril Hcl) .[name].. 1 Once Daily 11)  Cinnamon 500 Mg Caps (Cinnamon) .... One Capsule in The Morning and One Capsule in The Evening 12)  Centrum Silver  Tabs (Multiple Vitamins-Minerals) .... Once Daily  Allergies (verified): 1)  ! Codeine 2)  ! * No Latex Allergy 3)  ! * No Ivp Dye Allergy 4)  ! * No Shellfish Allergy 5)  ! * Statins 6)  * Feldene Pyroxicam  Past History:  Family  History: Last updated: 09/23/2006 Fam hx CHF Fam hx MS Family History Hypertension  Social History: Last updated: 05/23/2008 Former Smoker Retired  Married  Alcohol Use - no Drug Use - no  Risk Factors: Smoking Status: quit (10/12/2009)  Past medical, surgical, family and social histories (including risk factors) reviewed, and no changes noted (except as noted below).  Past Medical History: Reviewed history from 08/30/2008 and no changes required. 1.Atrial flutter     --s/p ablation 07/17/2008    --echo: EF 60-65% trivial AI 2. Sleep apnea 3. Hyperlipidemia    --h/o myostitis with statins 4. Hypertension 5. Hypothyroidism 6. GERD 7. Osteoarthritis 8. Chronic dyspnea. 9. Bradycardia 10. Glucose intolerance 11. h/o micturition syncope 12. h/o negative Myoview 4/07  Family History: Reviewed history from 09/23/2006 and no changes required. Fam hx CHF Fam hx MS Family History Hypertension  Social History: Reviewed history from 05/23/2008 and no changes required. Former Smoker Retired  Married  Alcohol Use - no Drug Use - no  Review of Systems  The patient denies anorexia, fever, weight loss, weight gain, vision loss, decreased hearing, hoarseness, chest pain, syncope, dyspnea on exertion, peripheral edema, prolonged cough, headaches, hemoptysis, abdominal pain, melena, hematochezia, severe indigestion/heartburn, hematuria, incontinence, genital sores, muscle weakness, suspicious skin lesions, transient blindness, difficulty walking, depression, unusual weight change, abnormal bleeding, enlarged lymph nodes, angioedema, breast masses, and testicular masses.    Physical Exam  General:  alert and well-developed.   weight loss documented coped the pt graphs into the emr Head:  normocephalic and atraumatic.   Ears:  R ear normal, L ear normal, and no external deformities.   Nose:  no external deformity and no nasal discharge.   Mouth:  pharynx pink and moist and no  erythema.   Neck:  No deformities, masses, or tenderness noted. Lungs:  normal respiratory effort and no wheezes.   Heart:  normal rate and regular rhythm.   Abdomen:  soft, non-tender, and normal bowel sounds.   Msk:  no joint swelling, no joint warmth, decreased ROM, and joint tenderness.   Extremities:  trace left pedal edema and trace right pedal edema.   Neurologic:  alert & oriented X3 and finger-to-nose normal.     Impression & Recommendations:  Problem # 1:  Hx of ATRIAL FLUTTER (ICD-427.32)  rate is good His updated medication list for this problem includes:    Amlodipine Besylate 10 Mg Tabs (Amlodipine besylate) .[name]... 1 once daily    Aspirin 81 Mg Tbec (Aspirin) .[name]... Take one tablet by mouth daily  Reviewed the following: PT: 21.5 (06/30/2008)   INR: 2.1 ratio (06/30/2008) Coumadin Dose (weekly): 40 (08/10/2008) Prior Coumadin Dose (weekly): 40 (08/10/2008)  Problem # 2:  SLEEP APNEA (ICD-780.57) has not worn since has lost weight  Problem # 3:  HYPERTENSION (ICD-401.9)  His updated medication list for this problem includes:    Amlodipine Besylate 10 Mg Tabs (Amlodipine besylate) .[name]... 1 once daily    Benazepril Hcl 40 Mg Tabs (Benazepril hcl) .[name]... 1 once daily  BP today: 130/80 Prior BP: 150/88 (09/06/2009)  Prior 10 Yr Risk Heart Disease: 7 % (04/18/2008)  Labs Reviewed: K+: 4.0 (07/13/2009) Creat: : 1.1 (07/13/2009)   Chol: 161 (07/13/2009)   HDL: 61.80 (07/13/2009)   LDL: 116 (04/03/2009)   TG: 76.0 (04/03/2009)  Problem # 4:  HYPOTHYROIDISM (ICD-244.9)  His updated medication list for this problem includes:    Synthroid 137 Mcg Tabs (Levothyroxine sodium) .[name]... 1 once daily  Labs Reviewed: TSH: 1.09 (07/13/2009)    HgBA1c: 6.2 (07/04/2009) Chol: 161 (07/13/2009)   HDL: 61.80 (07/13/2009)   LDL: 116 (04/03/2009)   TG: 76.0 (04/03/2009)  Complete Medication List: 1)  Synthroid 137 Mcg Tabs (Levothyroxine sodium) .[name].. 1 once daily 2)  Amlodipine  Besylate 10 Mg Tabs (Amlodipine besylate) .[name].. 1 once daily 3)  Omeprazole 20 Mg Cpdr (Omeprazole) .... Once daily 4)  Proscar 5 Mg Tabs (Finasteride) .... Once daily 5)  Fish Oil Caps (Omega-3 fatty acids caps) .... Once daily 6)  Paroxetine Hcl 40 Mg Tabs (Paroxetine hcl) .[name].. 1 once daily 7)  Diclofenac Sodium 75 Mg Tbec (Diclofenac sodium) .[name].. 1 two times a day 8)  Crestor 5 Mg Tabs (Rosuvastatin calcium) .... M,w,f, 9)  Aspirin 81 Mg Tbec (Aspirin) .... Take one tablet by mouth daily 10)  Benazepril Hcl 40 Mg Tabs (Benazepril hcl) .[name].. 1 once daily 11)  Cinnamon 500 Mg Caps (Cinnamon) .... One capsule in the morning and one capsule in the evening 12)  Centrum Silver Tabs (Multiple vitamins-minerals) .... Once daily  Patient Instructions: 1)  Please schedule a follow-up appointment in 4 months. 2)  Hepatic Panel prior to visit, ICD-9:995.20 3)  Lipid Panel prior to visit, ICD-9:272.4 4)  TSH prior to visit, ICD-9:244.8 5)  HbgA1C prior to visit, ICD-9:250.00

## 2010-05-01 NOTE — Assessment & Plan Note (Signed)
Summary: roa after elevated cbg/bmw   Vital Signs:  Patient profile:   75 year old male Height:      75 inches Weight:      236 pounds Temp:     98.2 degrees F oral Pulse rate:   72 / minute Resp:     14 per minute BP sitting:   130 / 80  (left arm)  Vitals Entered By: Willy Eddy, LPN (July 13, 2009 9:16 AM) CC: roa-LOST  34 pounds!  roa labs- stopped cclonidine due to low bp after lossing weight   Primary Care Provider:  Stacie Glaze MD  CC:  roa-LOST  34 pounds!  roa labs- stopped cclonidine due to low bp after lossing weight.  History of Present Illness: has been monitering blood presure at the [facility] via  the [facility] system has monitered glucoses and they seen to corelate with the A1C  6.2 his meter reading have shown him the effect of his exercize weigth reduction has been impressive and he has moniterd his weight his goal is 210 readings are under 140  with the weigth loss he has been able to cut the clonidine to 0   Preventive Screening-Counseling & Management  Alcohol-Tobacco     Smoking Status: quit     Year Quit: 1980  Problems Prior to Update: 1)  Impaired Fasting Glucose  (ICD-790.21) 2)  Preventive Health Care  (ICD-V70.0) 3)  Bradycardia  (ICD-427.89) 4)  Sleep Apnea  (ICD-780.57) 5)  Hx of Atrial Flutter  (ICD-427.32) 6)  Osteoarthritis  (ICD-715.90) 7)  Hypothyroidism  (ICD-244.9) 8)  Hypertension  (ICD-401.9) 9)  Hyperlipidemia  (ICD-272.4) 10)  Gerd  (ICD-530.81) 11)  Depression  (ICD-311)  Current Problems (verified): 1)  Impaired Fasting Glucose  (ICD-790.21) 2)  Preventive Health Care  (ICD-V70.0) 3)  Bradycardia  (ICD-427.89) 4)  Sleep Apnea  (ICD-780.57) 5)  Hx of Atrial Flutter  (ICD-427.32) 6)  Osteoarthritis  (ICD-715.90) 7)  Hypothyroidism  (ICD-244.9) 8)  Hypertension  (ICD-401.9) 9)  Hyperlipidemia  (ICD-272.4) 10)  Gerd  (ICD-530.81) 11)  Depression  (ICD-311)  Medications Prior to Update: 1)  Synthroid 137 Mcg Tabs  (Levothyroxine Sodium) .[name].. 1 Once Daily 2)  Hydrochlorothiazide 12.5 Mg  Tabs (Hydrochlorothiazide) .... Once Daily 3)  Amlodipine Besylate 10 Mg Tabs (Amlodipine Besylate) .[name].. 1 Once Daily 4)  Omeprazole 20 Mg  Cpdr (Omeprazole) .... Once Daily 5)  Proscar 5 Mg  Tabs (Finasteride) .... Once Daily 6)  Fish Oil   Caps (Omega-3 Fatty Acids Caps) .... Once Daily 7)  Clonidine Hcl 0.1 Mg Tabs (Clonidine Hcl) .... 1/2 Two Times A Day 8)  Paroxetine Hcl 40 Mg Tabs (Paroxetine Hcl) .[name].. 1 Once Daily 9)  Diclofenac Sodium 75 Mg Tbec (Diclofenac Sodium) .[name].. 1 Two Times A Day 10)  Crestor 5 Mg Tabs (Rosuvastatin Calcium) .... M,w,f, 11)  Aspirin 81 Mg Tbec (Aspirin) .... Take One Tablet By Mouth Daily 12)  Benazepril Hcl 40 Mg Tabs (Benazepril Hcl) .[name].. 1 Once Daily  Current Medications (verified): 1)  Synthroid 137 Mcg Tabs (Levothyroxine Sodium) .[name].. 1 Once Daily 2)  Amlodipine Besylate 10 Mg Tabs (Amlodipine Besylate) .[name].. 1 Once Daily 3)  Omeprazole 20 Mg  Cpdr (Omeprazole) .... Once Daily 4)  Proscar 5 Mg  Tabs (Finasteride) .... Once Daily 5)  Fish Oil   Caps (Omega-3 Fatty Acids Caps) .... Once Daily 6)  Paroxetine Hcl 40 Mg Tabs (Paroxetine Hcl) .[name].. 1 Once Daily 7)  Diclofenac Sodium  75 Mg Tbec (Diclofenac Sodium) .[name].. 1 Two Times A Day 8)  Crestor 5 Mg Tabs (Rosuvastatin Calcium) .... M,w,f, 9)  Aspirin 81 Mg Tbec (Aspirin) .... Take One Tablet By Mouth Daily 10)  Benazepril Hcl 40 Mg Tabs (Benazepril Hcl) .[name].. 1 Once Daily  Allergies (verified): 1)  ! Codeine 2)  ! * No Latex Allergy 3)  ! * No Ivp Dye Allergy 4)  ! * No Shellfish Allergy 5)  ! * Statins 6)  * Feldene Pyroxicam  Past History:  Family History: Last updated: 09/23/2006 Fam hx CHF Fam hx MS Family History Hypertension  Social History: Last updated: 05/23/2008 Former Smoker Retired  Married  Alcohol Use - no Drug Use - no  Risk Factors: Smoking Status: quit (07/13/2009)  Past medical, surgical,  family and social histories (including risk factors) reviewed, and no changes noted (except as noted below).  Past Medical History: Reviewed history from 08/30/2008 and no changes required. 1.Atrial flutter     --s/p ablation 07/17/2008    --echo: EF 60-65% trivial AI 2. Sleep apnea 3. Hyperlipidemia    --h/o myostitis with statins 4. Hypertension 5. Hypothyroidism 6. GERD 7. Osteoarthritis 8. Chronic dyspnea. 9. Bradycardia 10. Glucose intolerance 11. h/o micturition syncope 12. h/o negative Myoview 4/07  Family History: Reviewed history from 09/23/2006 and no changes required. Fam hx CHF Fam hx MS Family History Hypertension  Social History: Reviewed history from 05/23/2008 and no changes required. Former Smoker Retired  Married  Alcohol Use - no Drug Use - no  Review of Systems  The patient denies anorexia, fever, weight loss, weight gain, vision loss, decreased hearing, hoarseness, chest pain, syncope, dyspnea on exertion, peripheral edema, prolonged cough, headaches, hemoptysis, abdominal pain, melena, hematochezia, severe indigestion/heartburn, hematuria, incontinence, genital sores, muscle weakness, suspicious skin lesions, transient blindness, difficulty walking, depression, unusual weight change, abnormal bleeding, enlarged lymph nodes, angioedema, and breast masses.    Physical Exam  General:  alert and well-developed.   weight loss documented coped the pt graphs into the emr Head:  normocephalic and atraumatic.   Ears:  R ear normal, L ear normal, and no external deformities.   Nose:  no external deformity and no nasal discharge.   Neck:  No deformities, masses, or tenderness noted. Lungs:  normal respiratory effort and no wheezes.   Heart:  normal rate and regular rhythm.   Abdomen:  soft, non-tender, and normal bowel sounds.   Msk:  no joint swelling, no joint warmth, decreased ROM, and joint tenderness.   Neurologic:  alert & oriented X3 and  finger-to-nose normal.     Impression & Recommendations:  Problem # 1:  HYPOTHYROIDISM (ICD-244.9)  His updated medication list for this problem includes:    Synthroid 137 Mcg Tabs (Levothyroxine sodium) .[name]... 1 once daily  Labs Reviewed: TSH: 1.85 (04/03/2009)    HgBA1c: 7.0 (04/10/2009) Chol: 195 (04/03/2009)   HDL: 63.90 (04/03/2009)   LDL: 116 (04/03/2009)   TG: 76.0 (04/03/2009)  Problem # 2:  HYPERTENSION (ICD-401.9)  weaned off the clonidine The following medications were removed from the medication list:    Hydrochlorothiazide 12.5 Mg Tabs (Hydrochlorothiazide) ..... Once daily    Clonidine Hcl 0.1 Mg Tabs (Clonidine hcl) .[name]... 1/2 two times a day His updated medication list for this problem includes:    Amlodipine Besylate 10 Mg Tabs (Amlodipine besylate) .[name]... 1 once daily    Benazepril Hcl 40 Mg Tabs (Benazepril hcl) .[name]... 1 once daily  BP today: 130/80 Prior BP:  132/80 (04/10/2009)  Prior 10 Yr Risk Heart Disease: 7 % (04/18/2008)  Labs Reviewed: K+: 3.9 (04/03/2009) Creat: : 0.9 (04/03/2009)   Chol: 195 (04/03/2009)   HDL: 63.90 (04/03/2009)   LDL: 116 (04/03/2009)   TG: 76.0 (04/03/2009)  Orders: TLB-BMP (Basic Metabolic Panel-BMET) (80048-METABOL)  Problem # 3:  HYPERLIPIDEMIA (ICD-272.4) Assessment: Improved  His updated medication list for this problem includes:    Crestor 5 Mg Tabs (Rosuvastatin calcium) ..... M,w,f,  Labs Reviewed: SGOT: 32 (04/03/2009)   SGPT: 32 (04/03/2009)  Lipid Goals: Chol Goal: 200 (04/18/2008)   HDL Goal: 40 (04/18/2008)   LDL Goal: 160 (04/18/2008)   TG Goal: 150 (04/18/2008)  Prior 10 Yr Risk Heart Disease: 7 % (04/18/2008)   HDL:63.90 (04/03/2009), 57.20 (08/12/2008)  LDL:116 (04/03/2009), 123 (08/12/2008)  Chol:195 (04/03/2009), 196 (08/12/2008)  Trig:76.0 (04/03/2009), 77.0 (08/12/2008)  Orders: Venipuncture (09811) TLB-Cholesterol, HDL (83718-HDL) TLB-Cholesterol, Direct LDL (83721-DIRLDL) TLB-Cholesterol, Total  (82465-CHO) TLB-TSH (Thyroid Stimulating Hormone) (84443-TSH)  Problem # 4:  HYPERTENSION (ICD-401.9) Assessment: Improved  will cut the HCTZ The following medications were removed from the medication list:    Hydrochlorothiazide 12.5 Mg Tabs (Hydrochlorothiazide) ..... Once daily    Clonidine Hcl 0.1 Mg Tabs (Clonidine hcl) .[name]... 1/2 two times a day His updated medication list for this problem includes:    Amlodipine Besylate 10 Mg Tabs (Amlodipine besylate) .[name]... 1 once daily    Benazepril Hcl 40 Mg Tabs (Benazepril hcl) .[name]... 1 once daily  Orders: TLB-BMP (Basic Metabolic Panel-BMET) (80048-METABOL)  Complete Medication List: 1)  Synthroid 137 Mcg Tabs (Levothyroxine sodium) .[name].. 1 once daily 2)  Amlodipine Besylate 10 Mg Tabs (Amlodipine besylate) .[name].. 1 once daily 3)  Omeprazole 20 Mg Cpdr (Omeprazole) .... Once daily 4)  Proscar 5 Mg Tabs (Finasteride) .... Once daily 5)  Fish Oil Caps (Omega-3 fatty acids caps) .... Once daily 6)  Paroxetine Hcl 40 Mg Tabs (Paroxetine hcl) .[name].. 1 once daily 7)  Diclofenac Sodium 75 Mg Tbec (Diclofenac sodium) .[name].. 1 two times a day 8)  Crestor 5 Mg Tabs (Rosuvastatin calcium) .... M,w,f, 9)  Aspirin 81 Mg Tbec (Aspirin) .... Take one tablet by mouth daily 10)  Benazepril Hcl 40 Mg Tabs (Benazepril hcl) .[name].. 1 once daily  Patient Instructions: 1)  Please schedule a follow-up appointment in 3 months. 2)  stop the  HCTZ   Preventive Care Screening  Colonoscopy:    Date:  03/09/2009    Next Due:  03/2014    Results:  abnormal

## 2010-05-01 NOTE — Assessment & Plan Note (Signed)
Summary: 4 month fup//ccm   Vital Signs:  Patient profile:   75 year old male Height:      75 inches Weight:      232 pounds BMI:     29.10 Temp:     98.2 degrees F oral Pulse rate:   72 / minute Resp:     14 per minute BP sitting:   140 / 80  (left arm)  Vitals Entered By: Willy Eddy, LPN (February 08, 2010 9:09 AM) CC: roa labs, Hypertension Management Is Patient Diabetic? No   Primary Care Jerrine Urschel:  Stacie Glaze MD  CC:  roa labs and Hypertension Management.  History of Present Illness: key to avoid DM is weight control and diet and exercize curbing appetitie through appropriate snacks   Hypertension History:      He denies headache, chest pain, palpitations, dyspnea with exertion, orthopnea, PND, peripheral edema, visual symptoms, neurologic problems, syncope, and side effects from treatment.        Positive major cardiovascular risk factors include male age 53 years old or older, hyperlipidemia, and hypertension.  Negative major cardiovascular risk factors include non-tobacco-user status.     Preventive Screening-Counseling & Management  Alcohol-Tobacco     Smoking Status: quit     Year Quit: 1980     Tobacco Counseling: not indicated; no tobacco use  Problems Prior to Update: 1)  Impaired Fasting Glucose  (ICD-790.21) 2)  Preventive Health Care  (ICD-V70.0) 3)  Bradycardia  (ICD-427.89) 4)  Sleep Apnea  (ICD-780.57) 5)  Hx of Atrial Flutter  (ICD-427.32) 6)  Osteoarthritis  (ICD-715.90) 7)  Hypothyroidism  (ICD-244.9) 8)  Hypertension  (ICD-401.9) 9)  Hyperlipidemia  (ICD-272.4) 10)  Gerd  (ICD-530.81) 11)  Depression  (ICD-311)  Current Problems (verified): 1)  Impaired Fasting Glucose  (ICD-790.21) 2)  Preventive Health Care  (ICD-V70.0) 3)  Bradycardia  (ICD-427.89) 4)  Sleep Apnea  (ICD-780.57) 5)  Hx of Atrial Flutter  (ICD-427.32) 6)  Osteoarthritis  (ICD-715.90) 7)  Hypothyroidism  (ICD-244.9) 8)  Hypertension  (ICD-401.9) 9)   Hyperlipidemia  (ICD-272.4) 10)  Gerd  (ICD-530.81) 11)  Depression  (ICD-311)  Medications Prior to Update: 1)  Synthroid 137 Mcg Tabs (Levothyroxine Sodium) .[name].. 1 Once Daily 2)  Amlodipine Besylate 10 Mg Tabs (Amlodipine Besylate) .[name].. 1 Once Daily 3)  Omeprazole 20 Mg  Cpdr (Omeprazole) .... Once Daily 4)  Proscar 5 Mg  Tabs (Finasteride) .... Once Daily 5)  Fish Oil   Caps (Omega-3 Fatty Acids Caps) .... Once Daily 6)  Paroxetine Hcl 40 Mg Tabs (Paroxetine Hcl) .[name].. 1 Once Daily 7)  Diclofenac Sodium 75 Mg Tbec (Diclofenac Sodium) .[name].. 1 Two Times A Day 8)  Crestor 5 Mg Tabs (Rosuvastatin Calcium) .... M,w,f, 9)  Aspirin 81 Mg Tbec (Aspirin) .... Take One Tablet By Mouth Daily 10)  Benazepril Hcl 40 Mg Tabs (Benazepril Hcl) .[name].. 1 Once Daily 11)  Cinnamon 500 Mg Caps (Cinnamon) .... One Capsule in The Morning and One Capsule in The Evening 12)  Centrum Silver  Tabs (Multiple Vitamins-Minerals) .... Once Daily  Current Medications (verified): 1)  Synthroid 137 Mcg Tabs (Levothyroxine Sodium) .[name].. 1 Once Daily 2)  Amlodipine Besylate 10 Mg Tabs (Amlodipine Besylate) .[name].. 1 Once Daily 3)  Omeprazole 20 Mg  Cpdr (Omeprazole) .... Once Daily 4)  Proscar 5 Mg  Tabs (Finasteride) .... Once Daily 5)  Fish Oil   Caps (Omega-3 Fatty Acids Caps) .... Once Daily 6)  Paroxetine Hcl 40  Mg Tabs (Paroxetine Hcl) .[name].. 1 Once Daily 7)  Diclofenac Sodium 75 Mg Tbec (Diclofenac Sodium) .[name].. 1 Two Times A Day 8)  Crestor 5 Mg Tabs (Rosuvastatin Calcium) .... M,w,f, 9)  Aspirin 81 Mg Tbec (Aspirin) .... Take One Tablet By Mouth Daily 10)  Benazepril Hcl 40 Mg Tabs (Benazepril Hcl) .[name].. 1 Once Daily 11)  Cinnamon 500 Mg Caps (Cinnamon) .... One Capsule in The Morning and One Capsule in The Evening 12)  Centrum Silver  Tabs (Multiple Vitamins-Minerals) .... Once Daily  Allergies (verified): 1)  ! Codeine 2)  ! * No Latex Allergy 3)  ! * No Ivp Dye Allergy 4)  ! * No Shellfish Allergy 5)  ! *  Statins 6)  * Feldene Pyroxicam  Past History:  Family History: Last updated: 09/23/2006 Fam hx CHF Fam hx MS Family History Hypertension  Social History: Last updated: 05/23/2008 Former Smoker Retired  Married  Alcohol Use - no Drug Use - no  Risk Factors: Smoking Status: quit (02/08/2010)  Past medical, surgical, family and social histories (including risk factors) reviewed, and no changes noted (except as noted below).  Past Medical History: Reviewed history from 08/30/2008 and no changes required. 1.Atrial flutter     --s/p ablation 07/17/2008    --echo: EF 60-65% trivial AI 2. Sleep apnea 3. Hyperlipidemia    --h/o myostitis with statins 4. Hypertension 5. Hypothyroidism 6. GERD 7. Osteoarthritis 8. Chronic dyspnea. 9. Bradycardia 10. Glucose intolerance 11. h/o micturition syncope 12. h/o negative Myoview 4/07  Family History: Reviewed history from 09/23/2006 and no changes required. Fam hx CHF Fam hx MS Family History Hypertension  Social History: Reviewed history from 05/23/2008 and no changes required. Former Smoker Retired  Married  Alcohol Use - no Drug Use - no  Review of Systems  The patient denies anorexia, fever, weight loss, weight gain, vision loss, decreased hearing, hoarseness, chest pain, syncope, dyspnea on exertion, peripheral edema, prolonged cough, headaches, hemoptysis, abdominal pain, melena, hematochezia, severe indigestion/heartburn, hematuria, incontinence, genital sores, muscle weakness, suspicious skin lesions, transient blindness, difficulty walking, depression, unusual weight change, abnormal bleeding, enlarged lymph nodes, angioedema, breast masses, and testicular masses.         Flu Vaccine Consent Questions     Do you have a history of severe allergic reactions to this vaccine? no    Any prior history of allergic reactions to egg and/or gelatin? no    Do you have a sensitivity to the preservative Thimersol? no    Do  you have a past history of Guillan-Barre Syndrome? no    Do you currently have an acute febrile illness? no    Have you ever had a severe reaction to latex? no    Vaccine information given and explained to patient? yes    Are you currently pregnant? no    Lot Number:[id number]   Exp Date:09/29/2010   Site Given  Left Deltoid IM   Physical Exam  General:  alert and well-developed.   weight loss documented coped the pt graphs into the emr Head:  normocephalic and atraumatic.   Ears:  R ear normal, L ear normal, and no external deformities.   Nose:  no external deformity and no nasal discharge.   Mouth:  pharynx pink and moist and no erythema.   Neck:  No deformities, masses, or tenderness noted. Lungs:  normal respiratory effort and no wheezes.   Heart:  normal rate and regular rhythm.   Abdomen:  soft, non-tender, and normal  bowel sounds.   Msk:  no joint swelling, no joint warmth, decreased ROM, and joint tenderness.   Extremities:  trace left pedal edema and trace right pedal edema.   Neurologic:  alert & oriented X3 and finger-to-nose normal.     Impression & Recommendations:  Problem # 1:  IMPAIRED FASTING GLUCOSE (ICD-790.21)  if weigth gain continues willl be DM  Labs Reviewed: Creat: 1.1 (07/13/2009)     Problem # 2:  HYPERTENSION (ICD-401.9)  His updated medication list for this problem includes:    Amlodipine Besylate 10 Mg Tabs (Amlodipine besylate) .[name]... 1 once daily    Benazepril Hcl 40 Mg Tabs (Benazepril hcl) .[name]... 1 once daily  BP today: 140/80 Prior BP: 130/80 (10/12/2009)  10 Yr Risk Heart Disease: 22 % Prior 10 Yr Risk Heart Disease: 7 % (04/18/2008)  Labs Reviewed: K+: 4.0 (07/13/2009) Creat: : 1.1 (07/13/2009)   Chol: 224 (02/01/2010)   HDL: 88.50 (02/01/2010)   LDL: 116 (04/03/2009)   TG: 68.0 (02/01/2010)  Problem # 3:  HYPERLIPIDEMIA (ICD-272.4)  His updated medication list for this problem includes:    Crestor 5 Mg Tabs (Rosuvastatin  calcium) ..... M,w,f,  Labs Reviewed: SGOT: 44 (02/01/2010)   SGPT: 31 (02/01/2010)  Lipid Goals: Chol Goal: 200 (04/18/2008)   HDL Goal: 40 (04/18/2008)   LDL Goal: 160 (04/18/2008)   TG Goal: 150 (04/18/2008)  10 Yr Risk Heart Disease: 22 % Prior 10 Yr Risk Heart Disease: 7 % (04/18/2008)   HDL:88.50 (02/01/2010), 61.80 (07/13/2009)  LDL:116 (04/03/2009), 123 (08/12/2008)  Chol:224 (02/01/2010), 161 (07/13/2009)  Trig:68.0 (02/01/2010), 76.0 (04/03/2009)  Problem # 4:  DEPRESSION (ICD-311)  His updated medication list for this problem includes:    Paroxetine Hcl 40 Mg Tabs (Paroxetine hcl) .[name]... 1 once daily  Discussed treatment options, including trial of antidpressant medication. Will refer to behavioral health. Follow-up call in in 24-48 hours and recheck in 2 weeks, sooner as needed. Patient agrees to call if any worsening of symptoms or thoughts of doing harm arise. Verified that the patient has no suicidal ideation at this time.   Complete Medication List: 1)  Synthroid 137 Mcg Tabs (Levothyroxine sodium) .[name].. 1 once daily 2)  Amlodipine Besylate 10 Mg Tabs (Amlodipine besylate) .[name].. 1 once daily 3)  Omeprazole 20 Mg Cpdr (Omeprazole) .... Once daily 4)  Proscar 5 Mg Tabs (Finasteride) .... Once daily 5)  Fish Oil Caps (Omega-3 fatty acids caps) .... Once daily 6)  Paroxetine Hcl 40 Mg Tabs (Paroxetine hcl) .[name].. 1 once daily 7)  Diclofenac Sodium 75 Mg Tbec (Diclofenac sodium) .[name].. 1 two times a day 8)  Crestor 5 Mg Tabs (Rosuvastatin calcium) .... M,w,f, 9)  Aspirin 81 Mg Tbec (Aspirin) .... Take one tablet by mouth daily 10)  Benazepril Hcl 40 Mg Tabs (Benazepril hcl) .[name].. 1 once daily 11)  Cinnamon 500 Mg Caps (Cinnamon) .... One capsule in the morning and one capsule in the evening 12)  Centrum Silver Tabs (Multiple vitamins-minerals) .... Once daily  Other Orders: Flu Vaccine 48yrs + MEDICARE PATIENTS (J4782) Administration Flu vaccine - MCR (N5621)  Hypertension  Assessment/Plan:      The patient's hypertensive risk group is category B: At least one risk factor (excluding diabetes) with no target organ damage.  His calculated 10 year risk of coronary heart disease is 22 %.  Today's blood pressure is 140/80.  His blood pressure goal is < 140/90.  Patient Instructions: 1)  weight loss is the key 2)  protein  snack in between meals to cut appetitie 3)  Please schedule a follow-up appointment in 3 months. 4)  HbgA1C prior to visit, ICD-9:250.00   Orders Added: 1)  Flu Vaccine 78yrs + MEDICARE PATIENTS [Q2039] 2)  Administration Flu vaccine - MCR [G0008] 3)  Est. Patient Level IV [04540]

## 2010-05-01 NOTE — Progress Notes (Signed)
Summary: List of Blood Pressures and Weights taken at home  List of Blood Pressures and Weights taken at home   Imported By: Maryln Gottron 10/16/2009 10:50:01  _____________________________________________________________________  External Attachment:    Type:   Image     Comment:   External Document

## 2010-05-01 NOTE — Assessment & Plan Note (Signed)
Summary: f72m   Primary Provider:  Stacie Glaze MD  CC:  6 month rov.  Pt reports doing well. [name]  History of Present Illness: [name] White is a delightful 75 year old male with a history of dyspnea with a normal Myoview in April 2007.  He also has a history of micturition, syncope, hypertension, hyperlipidemia,borderline diabetes, obesity, and sleep apnea. He had new onset atrial flutter in April and underwent a. flutter ablation by Dr. Ladona Ridgel in April. He returns for routine f/u.  He is doing well. Very active. Eating whole grains. Has lost over 40 pounds! Good energy level. No CP. Following BP and weights closely and brings flowsheet. BMI from 31.5 to 27.5. BP running 120-130/70-80. No palpitations. No CP or SOB.   Most recent lipids in 4/11 TC 161 HDL 62 LDL 88. Using CPAP regularly. Taking crestor 3x/week and that is about all he can tolerate.   Current Medications (verified): 1)  Synthroid 137 Mcg Tabs (Levothyroxine Sodium) .[name].. 1 Once Daily 2)  Amlodipine Besylate 10 Mg Tabs (Amlodipine Besylate) .[name].. 1 Once Daily 3)  Omeprazole 20 Mg  Cpdr (Omeprazole) .... Once Daily 4)  Proscar 5 Mg  Tabs (Finasteride) .... Once Daily 5)  Fish Oil   Caps (Omega-3 Fatty Acids Caps) .... Once Daily 6)  Paroxetine Hcl 40 Mg Tabs (Paroxetine Hcl) .[name].. 1 Once Daily 7)  Diclofenac Sodium 75 Mg Tbec (Diclofenac Sodium) .[name].. 1 Two Times A Day 8)  Crestor 5 Mg Tabs (Rosuvastatin Calcium) .... M,w,f, 9)  Aspirin 81 Mg Tbec (Aspirin) .... Take One Tablet By Mouth Daily 10)  Benazepril Hcl 40 Mg Tabs (Benazepril Hcl) .[name].. 1 Once Daily 11)  Cinnamon 500 Mg Caps (Cinnamon) .... One Capsule in The Morning and One Capsule in The Evening 12)  Centrum Silver  Tabs (Multiple Vitamins-Minerals) .... Once Daily  Allergies (verified): 1)  ! Codeine 2)  ! * No Latex Allergy 3)  ! * No Ivp Dye Allergy 4)  ! * No Shellfish Allergy 5)  ! * Statins 6)  * Feldene Pyroxicam  Past History:  Past Medical History: Last  updated: 08/30/2008 1.Atrial flutter     --s/p ablation 07/17/2008    --echo: EF 60-65% trivial AI 2. Sleep apnea 3. Hyperlipidemia    --h/o myostitis with statins 4. Hypertension 5. Hypothyroidism 6. GERD 7. Osteoarthritis 8. Chronic dyspnea. 9. Bradycardia 10. Glucose intolerance 11. h/o micturition syncope 12. h/o negative Myoview 4/07  Review of Systems       As per HPI and past medical history; otherwise all systems negative.   Vital Signs:  Patient profile:   75 year old male Height:      75 inches Weight:      222 pounds BMI:     27.85 Pulse rate:   60 / minute Pulse rhythm:   regular BP sitting:   150 / 88  (left arm) Cuff size:   large  Vitals Entered By: Judithe Modest CMA (September 06, 2009 10:03 AM)  Physical Exam  General:  Well appearing. no resp difficulty HEENT: normal Neck: supple. no JVD. Carotids 2+ bilat; not bruits. No lymphadenopathy or thryomegaly appreciated. Cor: PMI nondisplaced. Regular rate & rhythm. No rubs, gallops. 2/6 systolic murmur RSB Lungs: clear Abdomen: obese. soft, nontender, nondistended. Good bowel sounds. Extremities: no cyanosis, clubbing, rash, edema Neuro: alert & orientedx3, cranial nerves grossly intact. moves all 4 extremities w/o difficulty. affect pleasant    Impression & Recommendations:  Problem # 1:  Hx  of ATRIAL FLUTTER (ICD-427.32) Resolved s/p ablation.  Problem # 2:  HYPERTENSION (ICD-401.9) BP a little elevated here but very well controlled at home. Continue current regimen.   Other Orders: EKG w/ Interpretation (93000)

## 2010-05-01 NOTE — Procedures (Signed)
Summary: Colonoscopy/Dr. Sharrell Ku  Colonoscopy/Dr. Sharrell Ku   Imported By: Maryln Gottron 05/10/2009 10:24:45  _____________________________________________________________________  External Attachment:    Type:   Image     Comment:   External Document

## 2010-05-01 NOTE — Letter (Signed)
Summary: Appointment - Reschedule  [facility]     [place], [place]    Phone:   Fax:      July 13, 2009 MRN: [id number]   [name] White [address] [place], [place]  [address]   Dear [name],   Due to a change in our office schedule, your appointment on  08-30-2009  at    2:30            must be changed.  It is very important that we reach you to reschedule this appointment. We look forward to participating in your health care needs. Please contact us at the number listed above at your earliest convenience to reschedule this appointment.     Sincerely,     Lorne Skeens  [facility] Scheduling Team

## 2010-05-01 NOTE — Progress Notes (Signed)
Summary: lab results.  Phone Note Call from Patient   Caller: Patient Call For: Stacie Glaze MD Reason for Call: Acute Illness Summary of Call: Pt would like lab results and to let Dr. Lovell Sheehan know his glucose level was 111 yesterday. [phone] Initial call taken by: Lynann Beaver CMA,  April 13, 2009 1:31 PM  Follow-up for Phone Call        info given and ov given Follow-up by: Willy Eddy, LPN,  April 14, 2009 7:52 AM

## 2010-05-06 ENCOUNTER — Other Ambulatory Visit: Payer: Self-pay | Admitting: Internal Medicine

## 2010-05-10 ENCOUNTER — Other Ambulatory Visit (INDEPENDENT_AMBULATORY_CARE_PROVIDER_SITE_OTHER): Payer: MEDICARE | Admitting: Internal Medicine

## 2010-05-10 DIAGNOSIS — M3390 Dermatopolymyositis, unspecified, organ involvement unspecified: Secondary | ICD-10-CM

## 2010-05-10 DIAGNOSIS — Z79899 Other long term (current) drug therapy: Secondary | ICD-10-CM

## 2010-05-28 ENCOUNTER — Ambulatory Visit: Payer: Self-pay | Admitting: Internal Medicine

## 2010-07-11 LAB — APTT: aPTT: 40 seconds — ABNORMAL HIGH (ref 24–37)

## 2010-07-11 LAB — PROTIME-INR
INR: 2.3 — ABNORMAL HIGH (ref 0.00–1.49)
INR: 2.4 — ABNORMAL HIGH (ref 0.00–1.49)

## 2010-08-14 NOTE — Assessment & Plan Note (Signed)
[facility]                            CARDIOLOGY OFFICE NOTE   NAME:[name] [name], [name] [name]                        MRN:          [id number]  DATE:04/09/2007                            DOB:          05-04-1935    PRIMARY CARE PHYSICIAN:  Stacie Glaze, M.D.   INTERVAL HISTORY:  The patient is a delightful 75 year old male with a  history of dyspnea with a normal Myoview in April of 2007.  This has  resolved.  He also has a history of micturition syncope, hypertension,  hyperlipidemia, borderline diabetes, obesity, and sleep apnea who  returns today for routine follow-up.  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   He is doing great.  He continues to be very active clearing his property  with his new house.  He denies any chest pain or shortness of breath.  He has gained back some of the weight which he lost.   CURRENT MEDICATIONS:  1. Synthroid 125 mcg a day.  2. Lotrel 10/40.  3. Fish Oil.  4. Hydrochlorothiazide 12.5 mg a day.  5. Aspirin 81 mg a day.  6. Paxil 40 mg a day.  7. Prilosec 20 mg a day.  8. Diclofenac 100 mg daily.  9. Proscar 5 mg a day.  10.Zetia 10 mg a day.  11.Clonidine 0.1 b.i.d.   INTOLERANCES:  STATINS cause myositis and BETA BLOCKERS are not  tolerated due to bradycardia.   PHYSICAL EXAMINATION:  GENERAL:  He is well appearing and ambulates  around the clinic without any respiratory difficulty.  VITAL SIGNS:  Blood pressure 125/70, heart rate 51, weight 263.  HEENT:  Normal.  NECK:  Supple with no JVD.  Carotids are 2+ bilaterally without bruits.  There is no lymphadenopathy or thyromegaly.  HEART:  PMI is not displaced.  He is bradycardic and regular.  No  murmurs, rubs, or gallops.  LUNGS:  Clear.  ABDOMEN:  Soft, nontender, and nondistended.  No hepatosplenomegaly.  No  bruits and no masses.  Good bowel sounds.  EXTREMITIES:  Warm with no cyanosis, clubbing, or edema.  No rash.  Good  distal  pulses.  NEUROLOGY:  Alert and oriented x3.  Cranial nerves II-XII grossly  intact.  Moves all four extremities without difficulty.  Affect is  pleasant.   EKG shows sinus bradycardia at a rate of 51 with a left anterior  fascicular block, no ST-T wave abnormalities.   ASSESSMENT:  1. Dyspnea.  This is resolved.  2. Hypertension, well controlled. We recently cut down his clonidine.  3. Hyperlipidemia.  This was complicated by myositis with statin      therapy.  Goal LDL for him is less than 100 given his borderline      diabetes.  We are going to check lipids today but he said that he      cheated a little bit over the holidays and would like a few weeks      to get back on board.  We will recheck in about a month.  4. Bradycardia.  This  is stable.  He seems to have some component of      sick sinus syndrome, currently no indication for pacemaker.  There      also may be a component of good physical conditioning here as well.   DISPOSITION:  We will see him back on a yearly basis or sooner if  needed.     Bevelyn Buckles. Bensimhon, MD  Electronically Signed    DRB/MedQ  DD: 04/09/2007  DT: 04/09/2007  Job #: [id number]   cc:   Stacie Glaze, MD

## 2010-08-14 NOTE — Op Note (Signed)
[name] [name], [name] [name]                 ACCOUNT NO.:  [id number]   MEDICAL RECORD NO.:  [id number]          PATIENT TYPE:  OIB   LOCATION:  3703                         FACILITY:  [facility]   PHYSICIAN:  Doylene Canning. Ladona Ridgel, MD    DATE OF BIRTH:  1935-06-20   DATE OF PROCEDURE:  07/07/2008  DATE OF DISCHARGE:                               OPERATIVE REPORT   PROCEDURE PERFORMED:  Electrophysiologic study and radiofrequency  catheter ablation of atrial flutter.   INTRODUCTION:  The patient is a very pleasant 75 year old male with a  history of atrial flutter who is now referred for catheter ablation.  He  has had therapeutic INRs over the last 3 weeks.  He has a preserved left  ventricular systolic function.  He desires and felt possible not to have  to take lifelong Coumadin and is now here for catheter ablation.   PROCEDURE:  After informed consent was obtained, the patient was taken  to the diagnostic EP Lab in a fasting state.  After usual preparation  and draping, intravenous fentanyl and midazolam were given for sedation.  A 6-French hexapolar catheter was inserted percutaneously in the right  jugular vein and advanced to the coronary sinus.  A 7-French 20-pole  halo catheter was inserted percutaneously in the right femoral vein and  advanced to the right atrium.  A 5-French quadripolar catheter was  inserted percutaneously in the right femoral vein and advanced to the  His bundle region.  After measurement of basic intervals, rapid atrial  pacing was carried out from the coronary sinus demonstrated an AV  Wenckebach cycle length of 600 milliseconds.  Programmed atrial  stimulation was carried out demonstrating AV node ERP of less than or  equals 600/500.  With these findings, additional rapid atrial pacing was  carried out down to 210 milliseconds.  Atrial flutter was initially  induced but terminated spontaneously and then spontaneously recurred.  A  single RF energy application was  delivered and after the RF energy  application, atrial flutter spontaneously stopped.  Pacing from the  coronary sinus was then carried out demonstrating intact atrial flutter  isthmus conduction.  Mapping was then carried out of the atrial flutter  isthmus.  It should be noted that atrial flutter isthmus had very large  atrial electrograms and there was a very long distance from the  tricuspid valve annulus to the inferior vena cava.  A total of 22 RF  energy applications were then delivered for durations of up to 4 minutes  at a time.  Initially, there was isthmus block.  However, this would  come and go.  The procedure was made much more difficult by the fact  that the atrial flutter isthmus was quite large and catheter stability  was made more difficult.  After the 18th RF energy application and  recurrence of atrial flutter isthmus conduction after block had been  demonstrated, it was deemed most appropriate to reach to a larger reach  catheter and a second ablation catheter, a 7-French quadripolar ablation  catheter, was advanced into the right atrium and 2 additional  RF energy  applications were delivered resulting in the creation of isthmus block.  At this point, the patient was observed for 30 minutes at which time  atrial flutter isthmus conduction block remained present.  Additional  rapid ventricular pacing was carried out demonstrating VA dissociation  at 600 milliseconds.  Programmed ventricular stimulation also  demonstrated VA dissociation at 600 milliseconds.  Rapid atrial pacing  and programmed atrial stimulation were carried out after the procedure  demonstrating no inducible arrhythmias and the catheters were removed.  Hemostasis was assured and the patient was returned to his room in  satisfactory condition.   COMPLICATIONS:  There were no immediate complications.   RESULTS:  A.  Baseline ECG.  Baseline ECG demonstrates sinus rhythm and  normal axis and intervals.   B.  Baseline intervals.  The sinus node cycle length was 1000  milliseconds, the PR interval was 190 milliseconds, QRS duration was 90  milliseconds, and the HV interval was 40 milliseconds.  The AH interval  was 78 milliseconds.  C.  Rapid ventricular pacing.  Rapid ventricular pacing following  ablation demonstrated VA dissociation at 600 milliseconds.  D.  Programmed ventricular stimulation.  Programmed ventricular  stimulation was carried out from the RV apex demonstrating VA  dissociation at 600 milliseconds.  E.  Programmed atrial stimulation.  Programmed atrial stimulation was  carried out from the coronary sinus in the right atrium base drive cycle  length of 045 milliseconds.  With the S1 and S2 stepwise decreased at  500 milliseconds, the AV node ERP was demonstrated.  F.  Rapid atrial pacing.  Rapid atrial pacing was carried out from the  coronary sinus and high right atrium demonstrating AV Wenckebach at 640  milliseconds.  G.  Arrhythmias observed.  1. Atrial flutter initiation was spontaneous, duration was sustained,      cycle length of the flutter was about 240 milliseconds the method      of termination was spontaneous and with catheter ablation.      a.     Mapping.  Mapping of the atrial flutter isthmus demonstrated       initial atrial flutter isthmus conduction.  Following catheter       ablation, there is bidirectional atrial flutter isthmus block.       The patient was observed for over 30 minutes with persistence of       conduction block.  I.  RF energy application.  A total of 21 RF       energy applications were delivered for a duration of nearly 75       minutes.  During RF energy application, atrial flutter isthmus       block was achieved.   CONCLUSION:  Study demonstrates successful electrophysiologic and RF  catheter ablation of typical atrial flutter.  The procedure was made  much more difficult by the patient's very thick and large atrial flutter   isthmus, required extensive RF energy application with delivery of 75  minutes of RF energy to the multiple sites in the atrial flutter isthmus  before atrial flutter isthmus block could be demonstrated.      Doylene Canning. Ladona Ridgel, MD  Electronically Signed     GWT/MEDQ  D:  07/07/2008  T:  07/08/2008  Job:  [id number]   cc:   Bevelyn Buckles. Bensimhon, MD  Stacie Glaze, MD

## 2010-08-14 NOTE — Assessment & Plan Note (Signed)
[name] [name]                            CARDIOLOGY OFFICE NOTE   NAME:[name] [name], [name] [name]                        MRN:          [id number]  DATE:09/26/2006                            DOB:          [birth year]    REFERRING PHYSICIAN:  Stacie Glaze, MD   INTERVAL HISTORY:  [name] [name] is a very delightful 75 year old male with a  history of hypertension, hyperlipidemia, bradycardia.  He has no known  coronary artery disease.  Recently he had an episode of micturition  syncope.  He was seen at [facility], had a chest CT and head CT,  which were negative.  He followed up in our office with [name] [name], who  ordered a Holter monitor.  This has been normal so far.  He has not had  any recurrent syncope.   He has also been having problems with myositis with elevated CK levels  on Pravastatin, and this was stopped.  He does have a history of  previous myositis with Zocor.  He currently denies any muscle aches,  though he does feel fatigued.  CK level previously was 1566, and his  down to 404.  LFTs also show just a minimally elevated AST at 43, which  was 91 previously.   Otherwise, he is doing well.  He has been taking his blood pressures at  home, and they have been in the 130 range.  He has been very active  clearing property without any exertional symptoms.   CURRENT MEDICATIONS:  Include:  1. Synthroid 125 mcg a day.  2. Lotrel 10/40.  3. Fish oil.  4. Hydrochlorothiazide 25.  5. Aspirin 81.  6. Paxil 40 a day.  7. Prilosec 20 a day.  8. Diclofenac 100 a day.  9. Proscar 5 a day.  10.Clonidine 0.2 b.i.d.  11.Zetia 10 a day.  12.Pravastatin 10 a day.  13 He also is wearing CPAP.   PHYSICAL EXAMINATION:  He is very pleasant.  Ambulates around the clinic  without any respiratory difficulty.  In no acute distress.  Blood pressure is 112/68 checked in both arms, and with the use of  Doppler as well to confirm.  Heart rate is 57.  His weight is  256.  HEENT:  Normal.  NECK:  Supple.  There is no JVD.  Carotids are 2+ bilaterally without  any bruits.  There is no lymphadenopathy or thyromegaly.  CARDIAC:  He has a regular rate and rhythm.  No murmurs, rubs, or  gallops.  LUNGS:  Clear.  ABDOMEN:  Obese.  Non-tender.  Non-distended.  No hepatosplenomegaly.  No bruits.  No masses.  Good bowel sounds.  EXTREMITIES:  Warm with no cyanosis, clubbing, or edema.  NEUROLOGIC:  He is alert and oriented x3.  Cranial nerves 2 through 12  are intact.  Moves all 4 extremities without difficulty.  Affect is very  pleasant.   ASSESSMENT AND PLAN:  1. Hypertension.  There is some discrepancy between his blood pressure      at home and his pressure here.  In general, pressures at home are  more important, but I suspect ours may be more reliable.  We have      previously calibrated his cuff here, and they matched well.  We      will try and do this again.  For now, we will continue current      regimen.  2. History of myositis on multiple statin drugs.  We have stopped his      statin.  We will continue the Zetia.  His muscle enzymes are just      still mildly elevated.  I have referred him to Dr. Estill Bakes in      Rheumatology for a 2nd opinion.  Potential muscle biopsy if he      continues to have elevated CKs.  He also has a significant history      of arthritis, and may get some input on this as well.  3. Syncope.  This has resolved.  Almost certainly micturition syncope      in the setting of volume depletion.  4. Disposition.  Return to clinic in 2 months for routine followup.      We will recheck his muscle enzymes and lipid panel at that time.     Bevelyn Buckles. Bensimhon, MD  Electronically Signed    DRB/MedQ  DD: 09/26/2006  DT: 09/26/2006  Job #: [id number]   cc:   Areatha Keas, M.D.

## 2010-08-14 NOTE — Assessment & Plan Note (Signed)
[facility]                            CARDIOLOGY OFFICE NOTE   NAME:[name], [name] GAINS                        MRN:          [id number]  DATE:10/28/2006                            DOB:          21-Jan-1936    PRIMARY CARE PHYSICIAN:  Stacie Glaze, M.D.   INTERVAL HISTORY:  [name] is a delightful 75 year old male with a  history of dyspnea with normal Myoview in April 2007.  He also has a  history of micturition syncope, hypertension, hyperlipidemia, borderline  diabetes, obesity and sleep apnea.  He returns today for routine  followup.   He recently saw Dr. Lemmie Evens in rheumatology for a myositis  which is thought to be secondary to his statin.  This has been  discontinued.  He feels much better.  He is not exercising formally but  says he is very active.  He denies any chest pain or shortness of  breath.  He says he has felt as good as he has in a long time.  Over the  last 1-1/2 years he has lost 30 pounds due to staying active and  watching his diet.  He does note that occasionally when he stands up, he  feels lightheaded.   CURRENT MEDICATIONS:  1. Synthroid 125 mcg daily.  2. Lotrel 10/40 mg.  3. Fish oil.  4. Hydrochlorothiazide 12.5 mg daily.  5. Aspirin 81 mg daily.  6. Paxil 40 mg daily.  7. Prilosec 20 mg daily.  8. Proscar 5 mg daily.  9. Diclofenac 100 mg daily.  10.Clonidine 0.2 mg daily.  11.Zetia 10 mg daily.   ALLERGIES/INTOLERANCES:  STATINS cause myositis.  BETA BLOCKERS are not  tolerated due to bradycardia.   PHYSICAL EXAMINATION:  GENERAL:  He is well-appearing and ambulates  around the clinic without any respiratory difficulty.  VITAL SIGNS:  Blood pressure 102/62, heart rate 52, weight 252 pounds.  HEENT:  Normal.  NECK:  Supple, no jugular venous distention.  Carotids 2+ bilaterally  without bruits.  There is no lymphadenopathy or thyromegaly.  HEART:  He is bradycardic and regular.  No murmurs, rubs or  gallops.  PMI is not displaced.  LUNGS:  Clear.  ABDOMEN:  Soft, nontender, non-distended.  There is no  hepatosplenomegaly, no bruits.  No masses.  Good bowel sounds.  EXTREMITIES:  Warm with no clubbing, cyanosis or edema.  No rash.  Good  distal pulses.  NEUROLOGIC:  He is alert and oriented x3.  Cranial nerves II-XII  are  intact.  He moves all four extremities without difficulty.  Affect is  very pleasant.   Electrocardiogram shows sinus bradycardia at a rate of 52 with a left  axis deviation.  No ST-T wave abnormalities.   ASSESSMENT/PLAN:  1. Dyspnea:  This is resolved.  2. Hypertension:  Blood pressure is actually a little bit on the low      side.  Will cut his clonidine down to 0.1 twice daily.  I told him      if his pressures continue to run low, he can stop this totally.  3. Hyperlipidemia:  This is complicated by myositis with statin      therapy.  His statins have been discontinued.  Will continue Zetia,      check lipids and a CK today.  If his LDL is not less than 100, we      may need to consider Welchol or niacin.  4. Hypothyroidism:  This is followed by the [facility].   DISPOSITION:  Return to the clinic in six months for a routine followup.  We have scheduled him for a routine screening with an abdominal  ultrasound, to rule out an abdominal aortic aneurysm, given his history  of hypertension.     Bevelyn Buckles. Bensimhon, MD  Electronically Signed    DRB/MedQ  DD: 10/28/2006  DT: 10/28/2006  Job #: [id number]

## 2010-08-14 NOTE — Discharge Summary (Signed)
[name] [name], [name] [name]                 ACCOUNT NO.:  [id number]   MEDICAL RECORD NO.:  [id number]          PATIENT TYPE:  OIB   LOCATION:  3703                         FACILITY:  [facility]   PHYSICIAN:  Duke Salvia, MD, FACCDATE OF BIRTH:  10/02/1935   DATE OF ADMISSION:  07/07/2008  DATE OF DISCHARGE:  07/08/2008                               DISCHARGE SUMMARY   PRIMARY CARDIOLOGIST:  Bevelyn Buckles. Bensimhon, MD   ELECTROPHYSIOLOGIST:  Doylene Canning. Ladona Ridgel, MD   DISCHARGE DIAGNOSIS:  Atrial flutter status post successful  radiofrequency catheter ablation.   SECONDARY DIAGNOSES:  1. Hypertension.  2. Hyperlipidemia.  3. Coumadin therapy in the setting of atrial flutter.   ALLERGIES:  He is intolerant to VIOXX and STATINS.   PROCEDURES:  Successful radiofrequency catheter inflation for atrial  fibrillation.   HISTORY OF PRESENT ILLNESS:  A 75 year old male with prior history of  hypertension and hyperlipidemia who was noted to exhibit atrial flutter  during a clinic visit with Dr. Gala Romney on February 23.  He  subsequently referred for electrophysiologic evaluation and seen by Dr.  Ladona Ridgel in clinic on March 24.  It was felt that the patient would  benefit from atrial flutter ablation, and he was initiated on Coumadin  therapy.   HOSPITAL COURSE:  The patient presented to the Electrophysiology Lab on  April 8 and underwent successful radiofrequency catheter ablation with  restoration of sinus rhythm.  The patient was placed back on Coumadin  postprocedure and his INR this morning is 2.6.  He will be discharged  home today in good condition.   DISCHARGE LABORATORY DATA:  INR 2.6.   DISPOSITION:  The patient is being discharged home today in good  condition.   FOLLOWUP PLANS AND APPOINTMENTS:  We have arranged for followup with  [facility] on April 14 at 9:30 a.m.  He is to  follow up with Dr. Lewayne Bunting on May 19 at 8:30 a.m.   DISCHARGE MEDICATIONS:  1.  Synthroid 137 mcg daily.  2. Aspirin 81 mg daily.  3. HCTZ 25 mg daily.  4. Lotrel 10/40 mg daily.  5. Omeprazole 20 mg daily.  6. Proscar 5 mg daily.  7. Fish oil daily.  8. Clonidine 0.1 mg b.i.d.  9. Paroxetine 40 mg daily.  10.Diclofenac 75 mg b.i.d.  11.Coumadin as directed.  12.Crestor 5 mg Monday, Wednesday, and Friday.   OUTSTANDING LABS AND STUDIES:  Followup INR next week.   DURATION OF DISCHARGE ENCOUNTER:  35 minutes including physician time.      Nicolasa Ducking, ANP      Duke Salvia, MD, [facility]  Electronically Signed    CB/MEDQ  D:  07/08/2008  T:  07/09/2008  Job:  [id number]

## 2010-08-14 NOTE — Assessment & Plan Note (Signed)
[facility]                            CARDIOLOGY OFFICE NOTE   NAME:[name] [name], [name] [name]                        MRN:          [id number]  DATE:08/15/2006                            DOB:          03/21/1936    PRIMARY CARE [name] [name]:  Dr. Darryll [name]   PRIMARY CARDIOLOGIST:  Dr. Arvilla [name].   PATIENT PROFILE:  A 75 year old Caucasian male, who presents to the  clinic following a syncopal episode on May 14.   PROBLEMS:  1. Micturition syncope.  2. Hypertension.  3. Hyperlipidemia.      a.     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  4. Asymptomatic bradycardia requiring beta blocker discontinuation.  5. History of dyspnea.      a.     Exercise Myoview April 2007, ejection fraction 64%, no       ischemia.      b.     May 2007, 2 D echocardiogram, ejection fraction 64%, no       significant valvular abnormalities.  Right ventricular systolic       pressures were mildly elevated.  6. Borderline diabetes.  7. Obesity.  8. Asthma.  9. Osteoarthritis status post right knee replacement.  10.Hypothyroidism.  11.Obstructive sleep apnea on CPAP.   HISTORY OF PRESENT ILLNESS:  A 75 year old Caucasian male with the above  problems.  He was last seen in clinic by Dr. Gala [name] on April 14 for  routine followup.  [name] has been on [facility] Diet and doing well  and has lost about 9 pounds since his last visit.  His mother-in-law  recently died, and he spent some time doing a lot of cleaning in her  house and thinks that he may not have been adequately hydrated.  On the  early morning hours of April 14, he got up to go to the bathroom at  approximately 1:30 a.m., and he was standing in front of the toilet  urinating and then the next thing he remembers, he was on the floor, and  his wife was standing over him.  He says that he was out for less than 1  minute.  He denies any symptoms prior to syncopal event and when he came  to, he said he was completely awake  and alert and denied any visual  disturbances, chest pain, shortness of breath, palpitations, or  weakness.  His wife called 911 just to be precautionary and when EMS  arrived, the patient reports that his blood pressure and vital signs  were stable.  He went into [facility] Emergency Room  where some lab work was performed revealing the elevated D-dimer at  13.73.  CK 1566, MB 22.3, and a troponin I of less than 0.05.  A chest  CT was performed and showed no evidence of pulmonary embolism.  A head  CT was performed and was unremarkable.  Lower extremity ultrasound was  also performed and was negative for DVT.  Following evaluation by the ER  physician, he was discharged and recommended to follow up here.  He had  not  had any recurrent episodes of syncope.  Because of an elevated AST  of 91 when he was seen in the ER.  As well as his elevated CK, he  stopped his Pravachol therapy.  He has otherwise been doing well.   [name] brings with him a chart of his blood pressures over the past  month.  For the most part, his pressures are well controlled and only  occasionally has some hypertension with pressures up to the 150s first  thing in the morning.   HOME MEDICATIONS:  1. Synthroid 125 mcg daily.  2. Lotrel 10/40 mg daily.  3. Fish oil daily.  4. HCTZ 25 mg daily.  5. Aspirin 81 mg daily.  6. Paxil 40 mg daily.  7. Omeprazole 20 mg daily.  8. Diclofenac 100 mg daily.  9. Proscar 5 mg daily.  10.Clonidine 0.2 mg b.i.d.  11.Zetia 10 mg daily.  12.Albuterol inhaler p.r.n.   PHYSICAL EXAMINATION:  VITAL SIGNS:  Blood pressure lying 136/82, heart  rate 61; sitting, 127/82, heart rate 66; standing at 0 minutes 135/82,  heart rate 67; two minutes 147/97, heart rate 67; five minutes 146/84,  heart rate 63.  GENERAL:  Pleasant white male in no acute distress.  Awake, alert, and  oriented x3.  NECK:  No bruits or JVD.  LUNGS:  Respirations are unlabored.  Clear to  auscultation.  CARDIAC:  Regular S1, S2 with a soft systolic ejection murmur at the  right upper sternal border.  ABDOMEN:  Round, soft, nontender, nondistended.  Bowel sounds present  x4.  EXTREMITIES:  Warm, dry, pink.  No cyanosis, clubbing, or edema.  Dorsalis pedis, posterior tibial pulses 2+ and equal bilaterally.   ACCESSORY CLINICAL FINDINGS:  None.   Repeat total CK, CK-MB, and LFTs, 2 D echocardiogram and event 30 day  monitor are pending.   ASSESSMENT/PLAN:  1. Micturition syncope.  The patient gives a good story for what is      most likely micturition syncope.  That being said, I will obtain      repeat 2 D echocardiogram to reevaluate LV function and rule out      structural abnormality.  I will also obtain a 30 day monitor to      rule out any possible arrhythmic causes of syncope.  The patient      does have a history of bradycardia which was worsened by beta      blockers in the past.  He is not currently on beta blocker therapy.  2. Hypertension.  Overall, his blood pressure is pretty well      controlled.  He will occasionally have elevated pressures first      thing in the morning, and we discussed this.  We could consider      changing his Clonidine from b.i.d. to a Clonidine patch weekly for      better 24 hour coverage; however, it is only sporadically that his      pressure is elevated in the morning, and I am not sure that the      benefit achieved by using the patch would justify the cost.  He      will continue on his current medical regimen and continue to      monitor his pressures at home.  3. Hyperlipidemia.  On last check, he had a total cholesterol of 170      with an LDL of 92 and normal LFTs done here in the office;  however,      when he was just seen in the emergency department at [facility], his AST      was elevated at 91 with elevated CK of 1566.  He stopped his     Pravachol on his own, and I recommended that he just remain off of      it.  It was  only a low dose of 10 mg.  He does have a history of      myositis with Zocor therapy in the past.  We will obtain repeat CKs      and LFTs.   DISPOSITION:  As above, we will obtain lab work, 2 D echocardiogram, and  30 day event monitor.  We will have him follow up with Dr. Gala [name] in  approximately 6 weeks.      Nicolasa Ducking, ANP       Madolyn Frieze. Jens Som, MD, [facility]    CB/MedQ  DD: 08/15/2006  DT: 08/15/2006  Job #: [id number]

## 2010-08-14 NOTE — Assessment & Plan Note (Signed)
[facility]                         ELECTROPHYSIOLOGY OFFICE NOTE   NAME:WINE, JAIMIE REDDITT                        MRN:          [id number]  DATE:06/22/2008                            DOB:          January 05, 1936    The patient is referred today for evaluation of atrial flutter.  The  patient is a patient of Dr. Prescott Gum who has a history of  dyslipidemia.  He also has a history of hypertension and glucose  intolerance.  He has no evidence of ischemia with a normal Myoview  several years ago.  The patient was in his usual state of health when he  began to feel some palpitations.  He essentially found to be in atrial  flutter with a controlled ventricular response and is referred today for  additional evaluation.  He has had no frank syncope.  He does have a  history of fatigue with evidence of myositis on statin therapy.  He  denies chest pain.   PAST MEDICAL HISTORY:  As noted in the HPI.  He also has a history of  gastroesophageal reflux disease.  There is also history of obstructive  sleep apnea for which the patient is on CPAP.   FAMILY HISTORY:  Negative for premature coronary disease.   SOCIAL HISTORY:  The patient is married who has got two sons.  He is  retired from [employer].  He denies tobacco abuse.  He rarely drinks  alcoholic beverages.   REVIEW OF SYSTEMS:  All systems reviewed.  All systems were negative  except as noted in the HPI.   PHYSICAL EXAMINATION:  GENERAL:  He is a pleasant well-appearing middle-  aged man in no distress.  VITAL SIGNS:  Blood pressure was 140/90, the pulse was 80 and regular,  the respirations were 18, temperature was 98.  HEENT:  Normocephalic and atraumatic.  Pupils are equal and round.  Oropharynx is moist.  Sclerae anicteric.  NECK:  No jugular venous distension.  No thyromegaly.  Trachea midline.  Carotids are 2+ and symmetric.  LUNGS:  Clear bilaterally to auscultation.  No wheezes, rales, or  rhonchi  are present.  There is no increased work of breathing.  CARDIOVASCULAR:  Irregular rhythm with normal S1 and S2.  There are no  murmurs.  The PMI was not enlarged nor was it laterally displaced.  ABDOMEN:  Soft, nontender.  There is no organomegaly.  The bowel sounds  are present.  There is no rebound or guarding.  EXTREMITIES:  No cyanosis, clubbing, or edema.  Pulses are 2+ and  symmetric.  NEUROLOGIC:  Alert and oriented x3.  Cranial nerves intact.  Strength is  5/5 and symmetric.   No EKG was done day at his clinic visit, but a previous EKG done several  days ago demonstrated typical atrial flutter and a controlled  ventricular response.   IMPRESSION:  1. New onset atrial flutter.  2. Initiation of Coumadin therapy secondary to new onset atrial      flutter.  3. Hypertension.  4. Dyslipidemia.   DISCUSSION:  I have discussed the treatment options with the  patient.  The risks, benefits, goals, and expectations of electrophysiologic study  and catheter ablation discussed, and he would like to proceed with this.  It will be scheduled earliest possible convenient time.     Doylene Canning. Ladona Ridgel, MD  Electronically Signed    GWT/MedQ  DD: 07/04/2008  DT: 07/05/2008  Job #: [id number]

## 2010-08-14 NOTE — Assessment & Plan Note (Signed)
[facility]                            CARDIOLOGY OFFICE NOTE   NAME:[name], [name] PETTENGILL                        MRN:          [id number]  DATE:05/24/2008                            DOB:          04-16-1935    PRIMARY CARE PHYSICIAN:  [name] Glaze, MD   INTERVAL HISTORY:  [name] is a delightful 75 year old male with a  history of dyspnea with a normal Myoview in April 2007.  He also has a  history of micturition, syncope, hypertension, hyperlipidemia,  borderline diabetes, obesity, and sleep apnea, who returns today for  routine followup.   Overall, he has been feeling well.  He has not been as active as usual  due to the weather, but says he feels pretty good.  He is upset that he  has gained back the weight he lost.  He recently had started using his  CPAP again after some time of noncompliance.  He denies any chest pain.  He does have very mild chronic dyspnea.  No orthopnea.  No PND.  No  lower extremity edema.  [name] [name] was able to get him back on statin.  He is now on low-dose Crestor every other day and seems to be tolerating  this quite well.   REVIEW OF SYSTEMS:  Remainder of review of systems is negative except  for HPI and problem list.  In particular, he denies any palpitations,  syncope, or presyncope.   CURRENT MEDICATIONS:  1. Synthroid 125 mcg a day.  2. Lotrel 10/40.  3. Fish oil 1000 b.i.d.  4. Aspirin 81 a day.  5. Paxil 40 a day.  6. Prilosec 20 a day.  7. Proscar 5 a day.  8. HCTZ 12.5 a day.  9. Diclofenac 75 b.i.d.  10.Crestor 5 mg every other day.  11.Clonidine 0.2 b.i.d.   PHYSICAL EXAMINATION:  GENERAL:  He is a large man in no acute distress.  He ambulates around the clinic without any respiratory difficulty.  VITAL SIGNS:  Blood pressure is 136/80, heart rate is 74, and weight is  290.  HEENT:  Normal.  NECK:  Supple.  There is no JVD.  Carotids are 2+ bilaterally without  any bruits.  There is no  lymphadenopathy or thyromegaly.  CARDIAC:  Regular rate and rhythm.  No murmurs, rubs, or gallops.  PMI is  nondisplaced.  LUNGS:  Clear.  ABDOMEN:  Obese, nontender, and nondistended.  No obvious  hepatosplenomegaly.  No bruits.  No masses.  EXTREMITIES:  Warm with no cyanosis, clubbing, or edema.  No rash.  NEUROLOGIC:  Alert and oriented x3.  Cranial nerves II through XII are  intact.  Moves all 4 extremities without difficulty.  Affect is  pleasant.   EKG shows atrial flutter at ventricular rate of 74.  No ST-T wave  abnormalities.   ASSESSMENT AND PLAN:  1. Atrial flutter.  This is relatively asymptomatic, but it is new for      him.  We have discussed the options regarding rate control versus      cardioversion versus atrial flutter ablation.  He is strongly      against long-term Coumadin.  In the setting, the best plan would be      to start him on Coumadin now and have him follow up with Dr. Ladona [name]      in electrophysiology for possible atrial flutter ablation in 6      weeks or so.  I have discussed this with Dr. Ladona [name], who agrees.      We will check a thyroid panel as well as an echocardiogram.  2. Hypertension.  This is followed by [name] [name].  Blood pressure is      mildly elevated.  I can consider titration of his clonidine as      tolerated.  3. Hyperlipidemia.  I congratulated [name] [name] for getting him back      on his statin.  We would titrate as possible to get his LDL down      under 100.  4. Obstructive sleep apnea.  I reinforced the need to be compliant      with his continuous positive airway pressure.     [name] Buckles. Bensimhon, MD  Electronically Signed    DRB/MedQ  DD: 05/24/2008  DT: 05/25/2008  Job #: [id number]

## 2010-08-17 NOTE — Assessment & Plan Note (Signed)
[facility]                              CARDIOLOGY OFFICE NOTE   NAME:[name], [name] DILKS                        MRN:          [id number]  DATE:12/24/2005                            DOB:          Dec 31, 1935    PRIMARY CARE PHYSICIAN:  Stacie Glaze, MD   PATIENT IDENTIFICATION:  [name] is a delightful 75 year old male who  returns for routine followup.   PROBLEMS:  1. Hypertension, labile.  2. Hyperlipidemia.  A:  Zocor stopped due to myositis followed by Dr. Lovell Sheehan.  1. Asymptomatic bradycardia, requiring beta-blocker discontinuation.  2. History of dyspnea.  A: Echocardiogram on Aug 18, 2005, EF of 64%. Right ventricular systolic  pressure mildly elevated, no significant valvular abnormalities.  B: Exercise Myoview in April of 2007, EF of 64%, no ischemia.  1. Obesity.  2. Asthma.  3. Gastroesophageal Reflux Disease.  4. Anxiety/Depression.  5. Osteoarthritis, status post right knee replacement.  6. Hypothyroidism.   CURRENT MEDICATIONS:  1. Synthroid 125 mcg daily.  2. Lotrel 10/40 daily.  3. Fish oil 2000 mg daily.  4. Hydrochlorothiazide 12.5 mg daily.  5. Finasteride 5 mg daily.  6. Aspirin 81 mg daily.  7. Paxil 40 mg daily.  8. Antara 130 mg daily.  9. Omeprazole 200 mg daily.  10.Diclofenac 100 mg every other day.  11.Clonidine 0.1 mg 1/2 tablet b.i.d.   ALLERGIES:  No known drug allergies.   INTERVAL HISTORY:  [name] returns today for a routine followup. Overall,  he is doing quite well, he has not yet started an exercise program but feels  he is pretty active otherwise. He denies any chest pain or shortness of  breath. We recently started him on Clonidine, however he says it dropped his  blood pressure to the systolics of about 115. He was asymptomatic with this  but however he was quite concerned, so he cut back to 1/2 tablet twice a  day. He also notes that he has some occasional fatigue and does not sleep  well at  night and some snoring, but has never had a witnessed apnea event.  However, his son does have sleep apnea. He tried cutting back on his  Diclofenac at our request, however he had severe arthritic pain and is now  taking one every other day.   PHYSICAL EXAMINATION:  GENERAL: Well appearing, no acute distress, ambulates  around the clinic without difficulty.  VITAL SIGNS: Respirations are unlabored.  Blood pressure 150/70, heart rate  68, weight is 268.  HEENT:  Sclerae anicteric. Extraocular motions intact.  There is no  xanthelasma, mucous membranes are moist.  NECK: Supple, there is no jugular vein distention. Carotids are two plus  bilaterally, there is a very soft bruit on the right.  CARDIAC: Regular rate and rhythm, plus S4, no murmur.  LUNGS: Clear.  ABDOMEN: Obese, nontender, nondistended, no hepatosplenomegaly, no bruits,  no masses.  EXTREMITIES: Warm, with no cyanosis, clubbing or edema. Good pulses.  NEUROLOGIC: Alert and oriented x3.  Cranial nerves 2 through 12 are intact  and moves all four  extremities without difficulty.   ASSESSMENT AND PLAN:  1. Hypertension, we have discussed the goal for his blood pressure, has a      systolic between 188-416 for him, however we would like to get to this      goal slowly. We will increase his Clonidine back to 0.1 mg b.i.d. in      followup closely. We will check a B-MET in one week to make sure his      renal function is stable.  2. Probable sleep apnea. Given his obesity, his fatigue and mildly      elevated right sided pressures, on echocardiogram I suspect he has      sleep apnea. We have referred him to Dr. Delton Coombes and his pulmonary clinic      for further evaluation.  3. Right carotid bruit, we will check a carotid ultrasound.   DISPOSITION:  Return to clinic in 4 months for a continued titration and  followup of his blood pressure.                                Bevelyn Buckles. Bensimhon, MD    DRB/MedQ  DD:  12/24/2005   DT:  12/26/2005  Job #:  [id number]   cc:   Stacie Glaze, MD  Leslye Peer, MD

## 2010-08-17 NOTE — Discharge Summary (Signed)
[place]  Patient:    [name] [name], [name] [name] Visit Number: [id number] MRN: [id number]          Service Type: SUR Location: [id number] Attending Physician:  Carolan Shiver Ii Dictated by:   Arnoldo Morale, P.A.-C. Admit Date:  07/13/2001 Discharge Date: 07/16/2001                             Discharge Summary  ADMISSION DIAGNOSES:  1. End-stage osteoarthritis bilateral knees, right worse than left.  2. Hypertension.  3. Hypothyroidism.  4. Benign prostatic hypertrophy.  5. Gastroesophageal reflux disease.  6. Hypercholesterolemia.  7. Anxiety disorder.  8. Mild asthma.  DISCHARGE DIAGNOSES:  1. End-stage osteoarthritis bilateral knees, status post right knee     replacement.  2. Acute blood loss anemia secondary to surgery.  3. Constipation.  4. Syncope.  5. Hypertension.  6. Hypothyroidism.  7. Benign prostatic hypertrophy.  8. Gastroesophageal reflux disease.  9. Hypercholesterolemia. 10. Anxiety disorder. 11. Mild asthma.  SURGICAL PROCEDURE:  On July 13, 2001, [name] [name] underwent a right total knee arthroplasty by Jonny Ruiz L. Rendall, M.D., assisted by Arnoldo Morale, P.A.-C.  COMPLICATIONS:  None.  CONSULTATIONS: 1. Physical therapy consult on July 14, 2001. 2. Case management consult on July 15, 2001. 3. Occupational therapy consult on July 15, 2001.  HISTORY OF PRESENT ILLNESS:  This 75 year old white male patient presented to Dr. Priscille Kluver with history of bilateral knee pain for the last year.  The right one seems to be worse at this time.  The pain is intermittent but present with any walking or weightbearing.  It is located over the anterior joint without radiation, and it increased with extension of the knee and decreases when he sits down and rests.  The pain is very sharp, and the knee pops, cracks, and gives way if he does any twisting movements.  He has failed conservative treatment, and because of that, he is  presenting for right knee replacement.  HOSPITAL COURSE:  [name] [name] tolerated his surgical procedure well without immediate postoperative complications.  He was subsequently transferred to 5000.  On postop day #1, he was afebrile, vital signs stable.  Pain was well controlled with medications, and he was switched from IV to p.o. medications.  He did have one episode of syncope after being up for the very first time, but that resolved very quickly, and he had no further episodes.  On postop day #2, T-max was 100.1.  Vital signs were stable.  The right knee incision was well approximated with staples and had minimal drainage. Hemoglobin 11.7, hematocrit 34.4.  He did have some constipation, and that was treated with a laxative.  He made a little bit slower progress with therapy, and so he was kept for one more day.  On April 17, however, he was ready for discharge home.  Pain was well controlled, and he was doing well with therapy.  He was discharged home later that day.  DIET:  He can resume his regular prehospital diet.  DISCHARGE MEDICATIONS:  1. He will resume all preop medications except no aspirin or Arthrotec.  2. He can restart 1 baby aspirin a day after Arixtra has been completed.  Home medications include:  3. Plendil 5 mg p.o. q.d.  4. Micardis HCT 80/12.5 mg 1 tablet p.o. q.d.  5. Prilosec 40 mg p.o. q.d. p.r.n. reflux.  6. Lipitor 10 mg p.o. q.d.  7. Paxil 30 mg p.o. q.d.  8. Synthroid 0.125 mg p.o. q.d.  9. Proscar 5 mg p.o. q.h.s. 10. Advair Diskus 250/50 mg inhaled q.d. p.r.n. asthma.  Additional medications include: 11. Arixtra 2.5 mg subcutaneously q.d. at 7 a.m. for 5 days. 12. OxyContin 20 mg 1 tablet p.o. q.12h. for pain, #26 with no refill. 13. Percocet 5/325 mg 1 to 2 p.o. q.4h. p.r.n. pain, #50 with no refill.  ACTIVITY:  He is to be out of bed, weightbearing as tolerated on right leg with use of walker.  He is to arrange for home health physical therapy  per [facility].  WOUND CARE:  He needs to keep his right knee incision clean and dry.  May shower after no drainage from the wound for two days.  He is to notify Dr. Priscille Kluver of temperature greater than or equal to 101.5, chills, pain unrelieved by pain medications, or foul-smelling drainage from the wound.  FOLLOWUP:  He is to follow up with Dr. Priscille Kluver in our office on Tuesday, July 28, 2001, and needs to call [phone] for that appointment.  He is to bring his pain medicine bottle with him to that return appointment.  LABORATORY DATA:  On April 15, white count was 11.6, hemoglobin 11.6, hematocrit 34.3.  On April 16, white count was 11.6, hemoglobin 11.7, hematocrit 34.4.  On April 17, white count was 10.9, hemoglobin 11.6, hematocrit 34.1, and platelets 164.  On April 10, glucose 132.  On April 15, glucose 122.  On April 16, glucose was 129.  On April 10, AST was 44, ALT 56.  All other laboratory studies were within normal limits. Dictated by:   Arnoldo Morale, P.A.-C. Attending Physician:  Carolan Shiver Ii DD:  07/23/01 TD:  07/23/01 Job: [id number]

## 2010-08-17 NOTE — Assessment & Plan Note (Signed)
[name]                             PULMONARY OFFICE NOTE   [name], [name] [name]                        MRN:          [id number]  DATE:05/06/2006                            DOB:          1935-04-22    SUBJECTIVE:  [name] [name] is a very pleasant gentleman with hypertension,  obesity, hypothyroidism and newly diagnosed obstructive sleep apnea.  At  our last visit we arranged for him to initiate CPAP at 11 cm of water.  He tells me that he has been tolerating the CPAP quite well using a  large full face mask.  He wears the mask for about 5 hours a night  reliably.  He does sometimes find the mask off when he wakes up early in  the morning.  He also sometimes takes it off after about 5 hours because  of some pressure on his face and some itching and discomfort.  He  certainly describes improved daytime symptoms with less sleepiness,  fewer naps, etc.  His breathing is easier at night.  Overall he is doing  quite well with CPAP.  He has had the opportunity to try a medium mask  in the past when he went to the sleep clinic.  He wonders whether his  compliance might be even better if he was using a medium mask instead of  a large full face mask.   MEDICATIONS:  1. Synthroid 0.125 mg daily.  2. Lotrel 10/40 mg daily.  3. Fish oil 2000 mg daily.  4. HCTZ 12.5 mg daily.  5. Aspirin 81 mg daily.  6. Paroxetine 40 mg daily.  7. Omeprazole 20 mg daily.  8. Diclofenac 100 mg daily.  9. Proscar 5 mg daily.  10.Clonidine 0.2 mg b.i.d.  11.Zetia 10 mg daily.  12.Albuterol 2 puffs q. 4 hours p.r.n. for shortness of breath.   PHYSICAL EXAMINATION:  In GENERAL:  This is a pleasant well-appearing,  somewhat overweight gentleman who is in no distress on room air.  He  interacts appropriately.  His weight is 275 pounds, temperature 98.1, blood pressure 111/72, heart  rate 58, SPO2 96% on room air.  HEENT:  He has a large neck.  The oropharynx is clear.  LUNGS:   Clear to auscultation bilaterally without any crackles or  wheezing.  HEART:  Regular rate and rhythm, borderline bradycardiac without a  murmur.  ABDOMEN:  Obese, soft, nontender with positive bowel sounds.  EXTREMITIES:  Have no significant edema.  NEUROLOGICALLY:  He has a nonfocal exam.   IMPRESSION:  1. Obstructive sleep apnea that appears to be adequately treated with      continuous positive airway pressure at 11 cm of water.  He is      tolerating his mask fairly well, but does still take it off      occasionally after he wakes up in the early morning hours.  2. Mild pulmonary hypertension.   PLAN:  1. Will continue his CPAP at 11 cm of water.  2. I will try a medium full face mask to see if this gives him  a      better fit and improves compliance.  3. [name] [name] will follow up with me in 6 months or sooner should he      have any difficulties.     [name] Peer, MD  Electronically Signed    RSB/MedQ  DD: 05/06/2006  DT: 05/06/2006  Job #: [id number]   cc:   Bevelyn Buckles. Bensimhon, MD  Stacie Glaze, MD

## 2010-08-17 NOTE — Op Note (Signed)
[place]  Patient:    VAIDEN, ADAMES Visit Number: [id number] MRN: [id number]          Service Type: SUR Location: RCRM 2550 13 Attending Physician:  Carolan Shiver Ii Dictated by:   Carlisle Beers. Dorothyann Gibbs, M.D. Proc. Date: 07/13/01 Admit Date:  07/13/2001                             Operative Report  PREOPERATIVE DIAGNOSIS:  Osteoarthritis, right knee.  POSTOPERATIVE DIAGNOSIS:  Osteoarthritis, right knee.  OPERATION PERFORMED:  Right LCS total knee replacement, fully cemented.  SURGEON:  John L. Dorothyann Gibbs, M.D.  ASSISTANT:  Arnoldo Morale, P.A.  ANESTHESIA:  General.  PATHOLOGY:  The patient has osteoarthritis, medial compartment, right knee which has been chronic and nonresponsive to conservative measures.  Both knees hurt virtually equally and show advanced degenerative wear on x-ray.  DESCRIPTION OF PROCEDURE:  Under general anesthesia, the right leg was prepared with DuraPrep and draped as a sterile field.  A sterile tourniquet was applied to the proximal thigh and the leg was wrapped out with the Esmarch and the tourniquet was used at 400 mm.  A midline incision was made and carried medial parapatellar deep.  The patella was everted and multiple small vessels were cauterized.  The femur was sized as a large.  Using the first guide, a proximal tibial resection was carried out.  Using the first femoral guide, an intercondylar drill hole was placed, using the second guide the anterior and posterior layer of the distal femur resected with a balanced 10 mm flexion gap.  It was necessary to loosen soft tissues around the medial tibia to balance the gap.  The intramedullary guide was then used and a distal femoral cut was made with a 10 mm extension gap.  The recessing guide was then used.  Following this, lamina spreaders were inserted and remnants of the menisci and cruciate ligaments were resected.  The proximal tibia was  then exposed.  It was sized as a #5.  Center peg hole and keel were placed.  Trial reduction of a #5 tibia.  A #10 mm rotating platform and large femur revealed excellent fit, alignment and stability.  The patella was then osteotomized and three peg holes placed and trial of a patella revealed excellent tracking and good alignment.  Permanent components were then obtained.  The bony surfaces were prepared with pulse irrigation.  All components were cemented in place. After it hardened, excess was removed.  The tourniquet was then let down at 54 minutes and multiple small vessels were cauterized.  A medium Hemovac drain was inserted.  The knee was then closed in layers with #1 Surgidac, #1 and 2-0 Vicryl and skin clips. Operative time was approximately  one hour and 15 minutes.  The patient returned to recovery with a femoral nerve block applied by anesthesia. Dictated by:   Carlisle Beers. Dorothyann Gibbs, M.D. Attending Physician:  Carolan Shiver Ii DD:  07/13/01 TD:  07/13/01 Job: [id number]

## 2010-08-17 NOTE — Assessment & Plan Note (Signed)
[facility]                            CARDIOLOGY OFFICE NOTE   NAME:WINE, ZAIRE LEVESQUE                        MRN:          [id number]  DATE:07/14/2006                            DOB:          1935-08-30    PRIMARY CARE PHYSICIAN:  Stacie Glaze, M.D.   PATIENT IDENTIFICATION:  [name] [name] is a delightful 75 year old male who  returns for routine followup.   PROBLEM LIST:  1. Hypertension.  2. Hyperlipidemia.  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  3. Asymptomatic bradycardia requiring beta blocker discontinuation.  4. History of dyspnea.      a.     Exercise Myoview April 2007:  Ejection fraction of 64%, no       ischemia.      b.     Echocardiogram May 2007:  Ejection fraction of 64%, no       significant valvular abnormalities, right ventricular systolic       pressures mildly elevated.  5. Borderline diabetes.  6. Obesity.  7. Asthma.  8. Osteoarthritis status post right knee replacement.  9. Hypothyroidism.  10.Obstructive sleep apnea on CPAP.   CURRENT MEDICATIONS:  1. Synthroid 125 mcg a day.  2. Lotrel 10/40.  3. Fish oil.  4. Hydrochlorothiazide 12.5 a day.  5. Aspirin 81.  6. Paxil 40.  7. Omeprazole 20.  8. Diclofenac 100.  9. Proscar 5.  10.Clonidine 0.2 b.i.d.  11.Zetia 10 a day.   INTERVAL HISTORY:  [name] [name] returns today for routine followup.  He is  doing quite well.  He is continuing to do all of his activities in his  yard without any chest pain or shortness of breath.  Unfortunately, he  is not able to exercise or walk on a routine basis due to left knee  pain.  He has not had orthopnea, PND, or lower extremity edema.  He has  not been following his blood pressure routinely but on the times he does  check it his systolic blood pressure has been around 130.  His primary  care [facility]ica Omura at the [facility] suggested possibly reconsidering a statin in  addition to his Zetia.  He has had a dry mouth which has been quite  severe but is  getting somewhat better.  He thinks it is related to the  clonidine.   PHYSICAL EXAMINATION:  GENERAL:  He is well-appearing, no acute  distress.  He ambulates around the clinic without any respiratory  difficulty.  VITAL SIGNS:  Blood pressure is 110/76, heart rate 65, weight is 270.  HEENT:  Sclerae anicteric, EOMI.  There are no xanthelasmas.  Mucous  membranes are moist.  NECK:  Supple, no JVD.  Carotids are 2+ bilaterally without any bruits.  There is no lymphadenopathy or thyromegaly.  CARDIAC:  He has a regular rate and rhythm.  No murmurs, rubs or  gallops.  LUNGS:  Clear.  ABDOMEN:  Obese, nontender, nondistended.  No hepatosplenomegaly, no  bruits, no masses.  Good bowel sounds.  EXTREMITIES:  Warm with no cyanosis, clubbing or edema.  Good distal  pulses.  NEUROLOGIC:  He is alert and oriented x3.  Cranial nerves II-XII are  intact.  Moves all four extremities without difficulty.  Affect is very  pleasant.   LABORATORIES:  Show normal LFTs.  Sodium 141, potassium 4.2, glucose  fasting is 113.  Total cholesterol is 170 with an HDL of 61, LDL of 92  triglycerides of 81.  Creatinine is 1.0.  EKG shows sinus rhythm at a  rate of 65 with a left anterior fascicular block, no significant ST-T  wave abnormalities.   ASSESSMENT AND PLAN:  1. Hypertension, well controlled here in the office but it seems like      his ambulatory pressure may be a little elevated.  I have asked him      to keep a blood pressure log and bring it back to [place] in a month.      If his blood pressure is elevated, we can consider increasing his      hydrochlorothiazide to 25 or adding spironolactone.  2. Hyperlipidemia.  Lipids look much better.  I do think given the      varied effects of statins it would be reasonable to try and get him      back on a statin.  We will try pravastatin 10 mg a day, watching      closely for muscle symptoms.  3. Glucose intolerance.  I told him that he is now prediabetic.   I      strongly suggested regular exercise and weight loss.  He will try      an exercise bike.   DISPOSITION:  Return to clinic in 1 month for routine followup of his  blood pressure and response to pravastatin.     Bevelyn Buckles. Bensimhon, MD  Electronically Signed    DRB/MedQ  DD: 07/14/2006  DT: 07/14/2006  Job #: [id number]   cc:   Stacie Glaze, MD

## 2010-08-17 NOTE — Assessment & Plan Note (Signed)
[facility]                         GASTROENTEROLOGY OFFICE NOTE   NAME:[name], [name] LICAUSI                        MRN:          [id number]  DATE:05/02/2006                            DOB:          November 02, 1935    REFERRING PHYSICIAN:  Stacie Glaze, MD   REASON FOR CONSULTATION:  Dysphagia.   HISTORY:  This is a 75 year old white male with a history of  hypertension, hyperlipidemia, hypothyroidism, gastroesophageal reflux  disease, and sleep apnea. He is referred through the courtesy of Dr.  Darryll Capers regarding dysphagia. The patient last underwent upper  endoscopy with Dr. Claudette Head in December of 1994. He was found to  have mild reflux esophagitis as well as a hiatal hernia and a dimunitive  proximal gastric polyp. He also has a history of colon polyps and  underwent surveillance in 1997 and again in 2000 with no polyps found.  Follow-up in 5 years recommended. The patient reports today a 1-year  history of intermittent difficulties with swallowing certain foods such  as chips or crackers. Basically dry foods. He states this results in  discomfort in the back of the throat and coughing. He denies any  difficulties with liquids. No nasal regurgitation. He does have  intermittent reflux symptoms such as heart burn and indigestion for  which he uses Prilosec on demand. No true esophageal dysphagia however.  He states that he is having no difficulties with abdominal pain,  constipation, diarrhea, melena or hematochezia.  His appetite and weight  have been stable. No nocturnal symptoms.   PAST MEDICAL HISTORY:  As above.   ALLERGIES:  Intolerant to     STATIN'S.   CURRENT MEDICATIONS:  1. Synthroid 0.125 mg daily.  2. Lotrel 10/40 mg daily.  3. Fish oil 1000 mg two daily.  4. Hydrochlorothiazide 12.5 mg daily.  5. Aspirin 81 mg daily.  6. Paroxetine 40 mg daily.  7. Diclofenac 100 mg daily.  8. Proscar 5 mg daily.  9. Clonidine 0.2 mg  b.i.d.  10.Zetia 10 mg daily.  11.Tylenol Extra Strength 1 gram t.i.d.  12.Albuterol inhaler p.r.n.   FAMILY HISTORY:  No family history of gastrointestinal malignancy.   SOCIAL HISTORY:  The patient is married with 2 sons. He attended  college. He is retired from [employer]. Does not smoke. Occasionally uses  alcohol.   REVIEW OF SYSTEMS:  Per diagnostic evaluation form.   PHYSICAL EXAMINATION:  Well-appearing male in no acute distress.  GENERAL: VITAL SIGNS: Blood pressure 140/82, heart rate is 60 and  regular, weight is 274.6 pounds. He is 6 feet, 3 inches in height.  HEENT: Sclerae are anicteric, conjunctivae are pink. Oral mucosa is  intact, no adenopathy.  LUNGS: Clear.  HEART: Regular.  ABDOMEN: Soft without tenderness, mass or hernia.   IMPRESSION:  1. Coughing sensation when swallowing dry foods likely due to      decreased saliva and inability to moisten food as a consequence of      some of his medications.  2. History of gastroesophageal reflux disease with endoscopic evidence      of mild  esophagitis. Rule out significant stricture.  3. Remote history of colon polyps. Due for surveillance. Patient is      aware but is not interested in follow-up at this time.   RECOMMENDATIONS:  1. Continue Prilosec as needed for reflux symptoms.  2. Schedule upper endoscopy.  3. Moisten foods, particularly dry foods prior to swallowing.  4. Consider modified barium swallow if the patient were to develop      symptoms suggestive of aspiration.  5. Ongoing general medical care with Dr. Lovell Sheehan.     [name] [name]. Marina Goodell, MD  Electronically Signed    JNP/MedQ  DD: 05/02/2006  DT: 05/02/2006  Job #: [id number]   cc:   Stacie Glaze, MD

## 2010-08-17 NOTE — Procedures (Signed)
NAMEJUAQUIN, [name] [name]                 ACCOUNT NO.:  [id number]   MEDICAL RECORD NO.:  [id number]          PATIENT TYPE:  OUT   LOCATION:  SLEEP CENTER                 FACILITY[facility]  [facility]   PHYSICIAN:  Barbaraann Share, MD,FCCPDATE OF BIRTH:  January 20[birth year] 1938   DATE OF STUDY:  02/27/2006                            NOCTURNAL POLYSOMNOGRAM   REFERRING PHYSICIAN:  Dr. Jasmine Awe.   INDICATION FOR SURGERY:  Hypersomnia with sleep apnea.   EPWORTH SLEEPINESS SCORE:  13   SLEEP ARCHITECTURE:  The patient had a total sleep time of 278 minutes  with decreased slow-wave sleep as well as REM.  Sleep onset latency was  normal at 26 minutes, and REM onset was very prolonged at 351 minutes.  Efficiency was very decreased at 66%.   RESPIRATORY DATA:  The patient underwent split-night protocol, where he  was found to have 152 obstructive events in the first 126 minutes of  sleep; this gave the patient a respiratory disturbance index of 72  events per hour during the first half of the night.  The events were not  positional, but there was moderate snoring noted throughout.  The  patient was then placed on a medium Ultra Mirage full face mask and  ultimately titrated to a final CPAP of 11 cmH20 with excellent control  of his obstructive events, even in the supine position.   OXYGEN DATA:  There was O2 desaturation as low as 80% with the patient's  obstructive events.   CARDIAC DATA:  Occasional PVCs were noted, but no clinically significant  arrhythmia.   MOVEMENT/PARASOMNIA:  Small numbers of leg jerks with no significant  sleep disruption.   IMPRESSIONS/RECOMMENDATIONS:  1. Split-night study reveals severe obstructive sleep apnea with a      respiratory disturbance index of 72 events per hour and oxygen      desaturation as low at 80% in the first half of the night.  The      patient was then placed on continuous positive airway pressure with      a medium Ultra Mirage full face mask and titrated to a  final      pressure of 11 cmH20.  The patient should also be encouraged to      work aggressively on weight loss.  2. Rare premature ventricular contractions noted during the study.     Barbaraann Share, MD,FCCP  Diplomate, American Board of Sleep  Medicine    KMC/MEDQ  D:  03/14/2006 17:06:36  T:  03/15/2006 07:35:52  Job:  [id number]

## 2010-08-17 NOTE — H&P (Signed)
[place]  Patient:    [name] [name], [name] [name] Visit Number: [id number] MRN: [id number]          Service Type: SUR Location: 5000 5029 01 Attending Physician:  Carolan Shiver Ii Dictated by:   Arnoldo Morale, P.A. Admit Date:  07/13/2001 Discharge Date: 07/16/2001                           History and Physical  DATE OF BIRTH:  04-17-1935  CHIEF COMPLAINT:  Bilateral knee pain for the last year, right worse than left.  HISTORY OF PRESENT ILLNESS:  This 75 year old white male patient presented to Dr. Jonny Ruiz L. Rendall III with a history of bilateral knee pain for about the last year.  Most of the time, the left knee has been more painful than the right, but for about the last week or so, the right one has been much more painful than the left.  He has no known injury or prior surgery to either knee.  The right knee pain is pretty much intermittent but present anytime with walking or weightbearing.  The pain is located over the anterior joint line without radiation.  Pain increases with extension of the knee and decreases when he sits down and rests.  The pain is described as a sharp pain in that right knee.  The knee does pop, crack and gives way if he does any twisting types of movements with the leg.  There is no real swelling since he has been on Arthrotec and the pain does not keep him up at night.  There is no locking or catching of the knee.  He did receive cortisone in the right knee in the past and that helped for several months and the Arthrotec and glucosamine right now are providing a moderate amount of relief.  He is ambulating with the use of a cane as needed due to some fears that it might give way.  ALLERGIES:  VIOXX caused elevated liver function tests.  CURRENT MEDICATIONS:  1. Plendil 5 mg one tablet p.o. q.d.  2. Micardis HCT 80/12.5 mg one tablet p.o. q.d.  3. Prilosec 40 mg one tablet p.o. q.d. p.r.n.  4. Arthrotec 75 mg  one tablet p.o. b.i.d., last dose July 08, 2001.  5. Lipitor 10 mg one tablet p.o. q.d.  6. Paxil 30 mg one tablet p.o. q.d.  7. Synthroid 125 mcg one tablet p.o. q.d.  8. Advair 250/50 Diskus one ______ inhaled q.d. p.r.n. asthma.  9. Proscar 5 mg one tablet p.o. q.h.s. 10. Baby aspirin 81 mg one tablet p.o. q.d., last dose June 07, 2001.  PAST MEDICAL HISTORY:  He was diagnosed with hypertension 15 years ago.  He has had hypothyroidism for the last eight years.  He does have some reflux. He has hypercholesterolemia.  He is treated for just a vague anxiety disorder and he has been on the Paxil for about eight years.  He reports he has mild asthma with his only symptom being a cough and that is why he takes the Advair just as needed.  His medical doctor is Dr. Stacie Glaze.  The patient denies any history of diabetes mellitus, hiatal hernia, peptic ulcer disease, heart disease or any other chronic medical condition other than noted previously.  PAST SURGICAL HISTORY:  Denies.  SOCIAL HISTORY:  He has a 20-pack-year history of cigarette smoking which he quit about 20 years ago.  He did smoke a pipe for several years but does not do that now.  He does not drink alcohol nor use any drugs.  He is married and has two children.  He and his wife live in a 2-story house with 18 steps into the main entrance.  He is retired from [employer].  PRIMARY CARE PHYSICIAN:  His medical doctor is Dr. Darryll Capers at [phone].  FAMILY HISTORY:  His mother died at the age of 20 with hypertension and heart disease.  His father died at the age of 63 with multiple sclerosis.  He is an only child.  His children are ages 45 and 39 and they are healthy.  REVIEW OF SYSTEMS:  He does wear glasses.  His PSA was recently elevated and he says he does have BPH and he had a negative workup for cancer. Dr. Jamison Neighbor is his urologist.  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  He does have a living will and  his power of attorney is [place] [employer]. All other systems are negative and noncontributory at this time.  PHYSICAL EXAMINATION:  GENERAL:  Well-developed, well-nourished, very tall white gentleman who walks with a slight limp.  Mood and affect are appropriate.  He talks easily with the examiner.  Height 6 feet 3-1/2 inches, weight 300 pounds and BMI is 36.5.  VITAL SIGNS:  Temperature 98.9 degrees Fahrenheit, pulse 68, respirations 12 and BP 158/86.  HEENT:  Normocephalic, atraumatic, without frontal or maxillary sinus tenderness to palpation.  Conjunctivae pink, sclerae anicteric.  PERRLA.  EOMs intact.  Funduscopic exam shows visible red reflex bilaterally with normal retinal vasculature, optic disks and cups.  No visible external ear deformities.  Hearing grossly intact.  Tympanic membranes pearly gray bilaterally with good light reflex.  Nose and nasal septum midline.  Nasal mucosa pink and moist without exudates or polyps noted.  Buccal mucosa pink and moist.  Good dentition.  Pharynx without erythema or exudates.  Tongue and uvula midline.  Tongue without fasciculations and uvula rises equally with phonation.  NECK:  No visible masses or lesions noted.  Trachea midline.  No palpable lymphadenopathy nor thyromegaly.  Carotids +2 bilaterally and without bruits. Full range of motion and nontender to palpation along the cervical spine.  CARDIOVASCULAR:  Heart rate and rhythm regular.  S1 and S2 present without rubs, clicks or murmurs noted.  RESPIRATORY:  Respirations even and unlabored.  Breath sounds clear to auscultation bilaterally without rales or wheezes noted.  ABDOMEN:  Rounded abdominal contour.  Bowel sounds present x4 quadrants. Soft, nontender to palpation, without hepatosplenomegaly nor CVA tenderness. Femoral pulses +2 bilaterally.  BACK:  Nontender to palpation along the entire length of the vertebral column.  BREASTS/GU/RECTAL:  These exams deferred at this  time.  MUSCULOSKELETAL:  No obvious deformities of bilateral upper extremities with  full range of motion of these extremities without pain.  Radial pulses are +2 bilaterally.  He has full range of motion of his hips, ankles and toes bilaterally.  DP and PT pulses are +2.  No lower extremity edema.  The left knee has full extension and flexion to 120 degrees with a moderate amount of crepitus with range of motion.  He does have minimal pain with palpation along the medial joint line and a minimal effusion at this time.  Collateral ligaments are stable.  Right knee has no obvious deformity.  There is a +1 effusion at this time.  No real crepitus with range of motion  but he does have full extension and flexion only to about 115 degrees.  There is pain with palpation on both the medial and lateral joint lines.  Collateral ligaments are stable.  NEUROLOGIC:  Alert and oriented x3.  Cranial nerves II-XII are grossly intact. Strength 5/5, bilateral upper and lower extremities.  Rapid alternating movements intact.  Deep tendon reflexes 1+, bilateral upper and lower extremities.  Sensation intact to light touch.  RADIOLOGIC FINDINGS:  X-rays taken in our office in July of 2002 showed near bone-on-bone contact in the medial compartments of both knees with only about 1/16th of an inch of joint space left.  IMPRESSION:  1. End-stage osteoarthritis, bilateral knees, in the medial compartments,     right worse than left.  2. Hypertension.  3. Hypothyroidism.  4. Benign prostatic hypertrophy.  5. Gastroesophageal reflux disease.  6. Hypercholesterolemia.  7. Anxiety disorder.  8. Mild asthma.  PLAN:  The patient will be admitted to [facility] on July 13, 2001 where he will undergo a right total knee arthroplasty by Dr. Jonny Ruiz L. Rendall. He will undergo all the routine preoperative laboratory tests and studies prior to this procedure. Dictated by:   Arnoldo Morale, P.A. Attending  Physician:  Carolan Shiver Ii DD:  07/09/01 TD:  07/09/01 Job: [id number]

## 2010-08-17 NOTE — Assessment & Plan Note (Signed)
[facility]                             PULMONARY OFFICE NOTE   NAME:[name] [name], [name] [name]                        MRN:          [id number]  DATE:03/11/2006                            DOB:          1935-04-29    SUBJECTIVE:  [name] [name] is a 75 year old gentleman seen in October for  suspected obstructive sleep apnea in a setting of mild pulmonary  hypertension.  Since that visit, he has undergone a split-night sleep  study.  This was performed on February 27, 2006.  The study showed  significant obstructive apneas and hypopneas during the first half of  the night with a Respiratory Disturbance Index of 72.4.  He was then  placed on CPAP that was titrated to control his hypopneas and apneas.  He was titrated to a pressure of 11 cm of water, and his Respiratory  Disturbance Index on this pressure was 2.1.  He tells me that he could  tell a significant difference in his quality of sleep, even during that  half night.  When he was placed on positive pressure, he felt refreshed  the next morning and is very anxious to start CPAP therapy, given just  that minimal exposure to the potential improvement in his sleep quality.  Otherwise, he has no new complaints.   CURRENT MEDICATIONS:  1. Synthroid 0.125 mg daily.  2. Lotrel 10/40 mg daily.  3. Fish oil 2000 mg daily.  4. HCTZ 12.5 mg daily.  5. Aspirin 81 mg daily.  6. Paroxetine 40 mg daily.  7. Enterra 130 mg daily.  8. Omeprazole 20 mg daily p.r.n.  9. Diclofenac 100 mg daily.  10.Clonidine 0.1 mg b.i.d.  11.Proscar 5 mg daily.  12.Tylenol p.r.n.  13.Albuterol 2 puffs q.4h. p.r.n.   PHYSICAL EXAMINATION:  VITAL SIGNS:  His weight was 272 pounds,  temperature 97.7, blood pressure 136/82, heart rate 57, SpO2 97% on room  air.  GENERAL:  This is a pleasant gentleman who is in no distress on room  air.  HEENT:  Oropharynx is clear.  There is no significant airway  obstruction.  NECK:  He does have a somewhat  enlarged neck.  LUNGS:  Clear to auscultation bilaterally.  HEART:  Regular rate and rhythm without murmur.  ABDOMEN:  Soft, nontender, nondistended.  Positive bowel sounds.  EXTREMITIES:  No clubbing, cyanosis or edema.  NEUROLOGIC:  He has a normal exam.   IMPRESSION:  1. Obstructive sleep apnea, which should be adequately treated on CPAP      at 11 cm of water.  2. Possible asthma.   PLAN:  1. I have ordered CPAP at 11 cm of water with humidity for [name] [name].      He will start this and then follow up with me in two months to      assess his progress.  2. [name] [name] may have a history of asthma.  It would be appropriate for      [place] to check spirometry on his next visit after he has initiated      therapy for OSA.  Leslye Peer, MD  Electronically Signed    RSB/MedQ  DD: 03/17/2006  DT: 03/17/2006  Job #: [id number]   cc:   Bevelyn Buckles. Bensimhon, MD  Stacie Glaze, MD

## 2010-08-17 NOTE — Assessment & Plan Note (Signed)
[facility]                            CARDIOLOGY OFFICE NOTE   NAME:[name], [name] PRIMEAU                        MRN:          [id number]  DATE:04/11/2006                            DOB:          Jul 30, 1935    PRIMARY CARE PHYSICIAN:  [name] [name], M.D.  [facility]   PATIENT NAME:  [name] is a delightful 75 year old male, who returns  for routine followup.  For complete past medical history, please see my  note of December 24, 2005.   He has a history of hypertension, hyperlipidemia and recently-diagnosed  obstructive sleep apnea.  He says he is doing quite well with his C-PAP  machine and feels much better, though he does have a hard time keeping  his mask on at night and has had some problems with dry airway.  Otherwise, he is doing well.  He is walking some, but is limited by knee  pain.  Denies any chest pain or shortness of breath.  Blood pressure  still remains slightly elevated.  No heart failure symptoms.   CURRENT MEDICATIONS INCLUDE:  1. Synthroid 125 micrograms a day.  2. Lotrel 10/40.  3. Fish oil.  4. HCTZ 12.5 daily.  5. Finasteride 5 a day.  6. Aspirin 81 a day.  7. Paxil 40 a day.  8. Antara 130 a day.  9. Prilosec 200 a day.  10.Diclofenac 100 mg every other day.  11.Clonidine 0.1 mg b.i.d.   PHYSICAL EXAMINATION:  He is well-appearing, in no acute distress.  He  ambulates around the clinic without any respiratory difficulty.  Blood  pressure is 158/86, heart rate is 54, weight is 279.  HEENT:  Sclerae are anicteric.  EOMI.  No xanthelasmas.  Mucous  membranes are moist.  Oropharynx is clear.  NECK:  The neck is supple.  There is no JVD.  Carotids are 2+  bilaterally, without bruits.  There is no lymphadenopathy or  thyromegaly.  CARDIAC:  He is bradycardic and regular.  There is an S4, no murmur.  LUNGS:  Clear.  ABDOMEN:  Obese, nontender, nondistended.  No hepatosplenomegaly, no  bruits, no masses.  EXTREMITIES:   Warm with no cyanosis, clubbing or edema, good pulses.  NEUROLOGIC:  Alert and oriented times two.  Cranial nerves II through  XII are intact.  Moves all four extremities without difficulty.  Affect  is bright.   LABORATORY WORK:  From November, shows total cholesterol of 224, HDL of  60, direct LDL of 409, triglycerides of 55.   EKG shows sinus bradycardia with rate of 54.  No significant STT-wave  abnormalities.   ASSESSMENT AND PLAN:  1. Hypertension:  This is still poorly controlled.  I increased      Clonidine to 0.2 b.i.d.  2. Hyperlipidemia:  He has a history of severe myositis with Zocor and      is not taking any statin.  I told him that I think we need to      decrease his LDL and I have given him a prescription for Zetia and  he will check with the pharmacy regarding cost.   DISPOSITION:  Return to clinic in three months for routine followup.     [name] Buckles. Bensimhon, MD  Electronically Signed    DRB/MedQ  DD: 04/11/2006  DT: 04/11/2006  Job #: [id number]   cc:   [name] Glaze, MD  [facility]

## 2010-08-17 NOTE — Assessment & Plan Note (Signed)
[facility]                               PULMONARY OFFICE NOTE   NAME:[name], ALFONSE GARRINGER                        MRN:          [id number]  DATE:01/15/2006                            DOB:          Jul 27, 1935    BRIEF HISTORY:  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  He was referred by Dr. Arvilla Meres for possible  obstructive sleep apnea after it was noted on an echocardiogram in May 2007  that he had some slightly elevated right ventricular systolic pressures.  On  questioning by Dr. Gala Romney, [name] acknowledged that he did have some  symptoms consistent with possible sleep disorder breathing.  He tells me  that he has asked his wife to observe him sleeping since that appointment  and she has observed that he does snore loudly and that he has apneic spells  while asleep.  He tells me that in the afternoons he runs out of steam and  he falls asleep easily.  He almost always falls asleep when he is a  passenger in the car.  At night, he has to get up frequently to urinate.  He  complains of some nocturnal nasal congestion and believes that he has been  told that he has a right septal deviation in the past.  He also has some dry  throat at night.  He goes to bed between 9:30 and 10:30 p.m.  It takes him  about five minutes to fall asleep.  He believes he wakes up 4-6 times a  night.  He gets out of bed to start his day at 6 a.m.  He has never had a  polysomnogram.   PAST MEDICAL HISTORY:  1. Hypertension.  2. Hyperlipidemia.  3. 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  4. Obesity.  5. GERD.  6. Possible asthma.  7. Anxiety and depression.  8. Osteoarthritis status post right knee replacement.  9. Hypothyroidism.   ALLERGIES:  STATIN medications believed to be associated  with myositis.   CURRENT MEDICATIONS:  1. Synthroid 0.125 mg daily.  2. Lotrel 10/40 mg daily.  3. Fish oil 2000 mg daily.  4. HCTZ 12.5 mg daily.  5. Aspirin 81 mg daily.  6. Paroxetine 40 mg daily.  7. Enterra 130 mg daily.  8. Omeprazole 20 mg daily.  9. Diclofenac 100 mg daily.  10.Clonidine 0.1 mg b.i.d.  11.Proscar 5 mg daily.  12.Tylenol ES 500 mg t.i.d. p.r.n.  13.Albuterol 2 puffs q.4h. p.r.n.   SOCIAL HISTORY:  The patient is married.  He is a former smoker with an  approximately 20-pack-year total history.  He quit smoking 20 years ago.  He  drinks two glasses of [name] a day.   FAMILY HISTORY:  Significant for heart disease in his mother and obstructive  sleep apnea in his son who was recently started on CPAP.   REVIEW OF SYSTEMS:  As per the HPI.  Also, please note the patient  has had  some cough at night, some hallucinations and/or vivid dreams, and symptoms  of depression.   PHYSICAL EXAMINATION:  GENERAL:  This is a very pleasant appropriate gentleman who is in no  distress on room air.  VITAL SIGNS:  Weight 268 pounds, temperature 97.8, blood pressure 168/94,  pulse 63, sPO2 97% on room air.  HEENT:  He has a somewhat large neck, his oropharynx is clear, there is no  posterior pharyngeal erythema.  LUNGS:  Clear to auscultation bilaterally.  HEART:  Regular rate and rhythm without murmur.  ABDOMEN:  Soft, nontender, nondistended, positive bowel sounds.  EXTREMITIES:  No cyanosis, clubbing, and edema.  NEUROLOGICAL:  Grossly nonfocal exam.   IMPRESSION:  1. Probable obstructive sleep apnea.  2. Mild pulmonary hypertension based on elevated right ventricular      pressures on his transthoracic echocardiogram.  3. Possible asthma.   PLAN:  1. I will obtain a split night polysomnogram as the patient has opted to      pursue CPAP if he does, indeed, have obstructive sleep apnea.  2. I will follow Mr. [name] in two months to review his progress on CPAP if,       indeed, is obstructive sleep apnea is confirmed.  3. Regarding [name] possible history of asthma, it would probably be      prudent for [place] to      check spirometry in the future.  We will perform this testing after his      sleep apnea has been sorted out.            ______________________________  Leslye Peer, MD      RSB/MedQ  DD:  01/15/2006  DT:  01/16/2006  Job #:  [id number]   cc:   Bevelyn Buckles. Bensimhon, MD  Stacie Glaze, MD

## 2010-08-17 NOTE — Assessment & Plan Note (Signed)
[facility]                              CARDIOLOGY OFFICE NOTE   NAME:WINE, DAL BLEW                        MRN:          [id number]  DATE:11/12/2005                            DOB:          June 18, 1935    PRIMARY CARE PHYSICIAN:  Stacie Glaze, MD   PATIENT IDENTIFICATION:  [name] is a delightful 75 year old male who  returns for a follow-up of his hypertension.   PROBLEM LIST:  1. Hypertension, labile.  2. Hyperlipidemia.      a.     Zocor stopped due to myositis.  Followed by Dr. Lovell Sheehan.  3. Symptomatic bradycardia, beta blockers discontinued.  4. Obesity.  5. Asthma.  6. Gastroesophageal reflux disease.  7. Anxiety/depression.  8. Osteoarthritis, status post right knee replacement.  9. Hypothyroidism.  10.BPH.   CURRENT MEDICATIONS:  1. Synthroid 125 mcg a day.  2. Lotrel 10/40 mg.  3. Fish oil 2000 mg a day.  4. Hydrochlorothiazide 12.5 mg a day.  5. Finasteride 5 mg a day.  6. Aspirin 81 mg a day.  7. Paxil 40 mg a day.  8. Antara 130 mg a day.  9. Prilosec 20 mg a day.  10.Diclofenac 100 mg a day.   ALLERGIES:  No known drug allergies.   INTERVAL HISTORY:  [name] returns today for routine follow-up.  He has  been doing quite well.  He has been very active in his yard without any  chest pain or shortness of breath.  He did get a blood pressure cuff, which  he had calibrated with our cuff in the office, and tells me that his blood  pressure has continued to run high.  He brings me a recent list with  systolics running from 136 to 173 with diastolics in the 70s.  Heart rate  has been ranging from 57 to 62.  He does tell me that previously he has had  some problems with dizziness and orthostatic symptoms, but this was some  time ago.  He is doing his best to watch his salt intake, although he cannot  quantify the numbers exactly but seems to have a relatively good idea of  salt contents of various foods.   PHYSICAL  EXAMINATION:  GENERAL:  Well-appearing, in no acute distress.  He  ambulates around the clinic without difficulty.  Respirations are unlabored.  VITAL SIGNS:  Blood pressure is 142/84 in the left arm, heart rate is 61.  His weight is 267.  HEENT:  Sclerae are anicteric.  There are no xanthelasmas.  Mucous membranes  are moist.  NECK:  Supple.  There is no JVD.  Carotids are 2+ bilaterally without  bruits.  There is no lymphadenopathy or thyromegaly.  CARDIAC:  He has a regular rate and rhythm with no murmurs, rubs or gallops.  LUNGS:  Clear.  There are no renal bruits.  ABDOMEN:  Soft, nontender, nondistended, no hepatosplenomegaly, no bruits,  no mass.  EXTREMITIES:  Warm with no cyanosis, clubbing or edema.  NEUROLOGIC:  He is alert and oriented x3.  Cranial nerves II-XII  are intact.  He moves all four extremities without any difficulty.   EKG:  Normal sinus rhythm with a rate of 61, with an anterior fascicular  block.  No significant ST-T wave changes.   ASSESSMENT AND PLAN:  1. Hypertension.  Blood pressure is still mildly elevated.  I considered      increasing his hydrochlorothiazide to 25 mg a day but I still think      this will leave him short of goal.  Instead, we will start him on      clonidine 0.1 mg b.i.d. and we will need to watch closely for      bradycardia.  Should he not be able to tolerate this, then we will need      to consider hydralazine.  I reminded him of the need to watch his salt      restriction as well as to consider stopping diclofenac, as can      exacerbate his hypertension and cause fluid retention.  I suggested      Extra Strength Tylenol instead.  2. Hyperlipidemia.  He was recently started on Antara by Dr. Lovell Sheehan.      Could also consider Zetia as needed.  3. Symptomatic bradycardia.  This is resolved off of his beta blocker.  I      told him he may need a pacemaker in the future but certainly no      indication for it at this time.    DISPOSITION:  Return to clinic in 4-6 weeks for follow-up on his blood  pressure.                                Bevelyn Buckles. Bensimhon, MD    DRB/MedQ  DD:  11/12/2005  DT:  11/12/2005  Job #:  [id number]

## 2010-10-02 ENCOUNTER — Telehealth: Payer: Self-pay | Admitting: Internal Medicine

## 2010-10-08 ENCOUNTER — Telehealth: Payer: Self-pay | Admitting: Internal Medicine

## 2010-10-08 NOTE — Telephone Encounter (Signed)
Pt calling needing to talk to nurse about orthostatic hypertension that pt wife is being treated for. Both pt and pt wife are pt of Dr. Gala Romney.

## 2010-10-08 NOTE — Telephone Encounter (Signed)
Attempted to call NA it appears pt wife is sch to see Dr Drue Novel today at 2:15, will f/u with them later

## 2010-10-09 NOTE — Telephone Encounter (Signed)
Pt's wife Consuelo Pandy was seen by Dr Drue Novel on yest 7/9 and med changes were made and pt was referred to neuro

## 2010-11-12 ENCOUNTER — Encounter: Payer: Self-pay | Admitting: Internal Medicine

## 2010-11-12 ENCOUNTER — Ambulatory Visit (INDEPENDENT_AMBULATORY_CARE_PROVIDER_SITE_OTHER): Payer: Medicare Other | Admitting: Internal Medicine

## 2010-11-12 VITALS — BP 140/83 | HR 60 | Temp 98.2°F | Resp 16 | Ht 75.0 in | Wt 240.0 lb

## 2010-11-12 DIAGNOSIS — I10 Essential (primary) hypertension: Secondary | ICD-10-CM

## 2010-11-12 DIAGNOSIS — F329 Major depressive disorder, single episode, unspecified: Secondary | ICD-10-CM

## 2010-11-12 DIAGNOSIS — F3289 Other specified depressive episodes: Secondary | ICD-10-CM

## 2010-11-12 DIAGNOSIS — E785 Hyperlipidemia, unspecified: Secondary | ICD-10-CM

## 2010-11-12 DIAGNOSIS — K219 Gastro-esophageal reflux disease without esophagitis: Secondary | ICD-10-CM

## 2010-11-12 LAB — LIPID PANEL
HDL: 81.9 mg/dL (ref >39.00)
Total CHOL/HDL Ratio: 3
Triglycerides: 60 mg/dL (ref 0.0–149.0)
VLDL: 12 mg/dL (ref 0.0–40.0)

## 2010-11-12 LAB — BASIC METABOLIC PANEL
CO2: 29 mEq/L (ref 19–32)
Calcium: 9.1 mg/dL (ref 8.4–10.5)
Chloride: 101 mEq/L (ref 96–112)
Glucose, Bld: 101 mg/dL — ABNORMAL HIGH (ref 70–99)
Sodium: 138 mEq/L (ref 135–145)

## 2010-11-12 NOTE — Progress Notes (Signed)
  Subjective:    Patient ID: [name] White, male    DOB: 1936/02/11, 75 y.o.   MRN: [id number]  HPI patient is a 75 year old white male presents for followup of hypertension hyperlipidemia history of benign prostatic hypertrophy and osteoarthritis primarily in his knees bilaterally.  He presents today for followup of his blood pressure initially was elevated but after rest blood pressure was normal.  He states he's been checking his blood pressure, and has been in excellent control he has increased stress and decreased exercise is gaining weight but has been caring for his wife after surgery    Review of Systems  Constitutional: Negative for fever and fatigue.  HENT: Negative for hearing loss, congestion, neck pain and postnasal drip.   Eyes: Negative for discharge, redness and visual disturbance.  Respiratory: Negative for cough, shortness of breath and wheezing.   Cardiovascular: Negative for leg swelling.  Gastrointestinal: Negative for abdominal pain, constipation and abdominal distention.  Genitourinary: Negative for urgency and frequency.  Musculoskeletal: Negative for joint swelling and arthralgias.  Skin: Negative for color change and rash.  Neurological: Negative for weakness and light-headedness.  Hematological: Negative for adenopathy.  Psychiatric/Behavioral: Negative for behavioral problems.   Past Medical History  Diagnosis Date  . Atrial flutter     s/p ablation 07/17/08; echo EF 60-65% trivival AI  . Sleep apnea   . Hyperlipidemia   . Hypothyroidism   . GERD (gastroesophageal reflux disease)   . Osteoarthritis   . Dyspnea     chronic  . Bradycardia   . Glucose intolerance (impaired glucose tolerance)   . Micturition syncope    History reviewed. No pertinent past surgical history.  reports that he has never smoked. He does not have any smokeless tobacco history on file. He reports that he does not drink alcohol or use illicit drugs. family history includes Heart  failure in his other; Hypertension in his other; and Multiple sclerosis in his other. Allergies  Allergen Reactions  . Codeine     REACTION: Rash  . Statins         Objective:   Physical Exam  Nursing note and vitals reviewed. Constitutional: He appears well-developed and well-nourished.  HENT:  Head: Normocephalic and atraumatic.  Eyes: Conjunctivae are normal. Pupils are equal, round, and reactive to light.  Neck: Normal range of motion. Neck supple.  Cardiovascular: Normal rate and regular rhythm.   Pulmonary/Chest: Effort normal and breath sounds normal.  Abdominal: Soft. Bowel sounds are normal.          Assessment & Plan:  Patient's blood pressure is stable his current medications his cholesterol results from his last office visit were reviewed with the patient his blood pressure medications are renewed.  He does have history of depression with thoughts about the need to resume exercise both for control of his depression as well as his weight and to help his blood pressure being better controlled he does not have any current symptoms from his BP

## 2010-11-20 ENCOUNTER — Other Ambulatory Visit: Payer: Self-pay | Admitting: Internal Medicine

## 2011-01-30 ENCOUNTER — Encounter: Payer: Self-pay | Admitting: Internal Medicine

## 2011-01-30 ENCOUNTER — Ambulatory Visit (INDEPENDENT_AMBULATORY_CARE_PROVIDER_SITE_OTHER): Payer: Medicare Other | Admitting: Internal Medicine

## 2011-01-30 DIAGNOSIS — E785 Hyperlipidemia, unspecified: Secondary | ICD-10-CM

## 2011-01-30 DIAGNOSIS — I4892 Unspecified atrial flutter: Secondary | ICD-10-CM

## 2011-01-30 DIAGNOSIS — I10 Essential (primary) hypertension: Secondary | ICD-10-CM

## 2011-01-30 NOTE — Progress Notes (Signed)
HPI:  [name] White is a delightful 75 year old male with a history of dyspnea with a normal Myoview in April 2007.  He also has a history of micturition, syncope, hypertension, hyperlipidemia,borderline diabetes, obesity, and sleep apnea. He had new onset atrial flutter in April and underwent a. flutter ablation by Dr. Ladona Ridgel in April. He returns for routine f/u.  He continues to do well. Previously lost about 60 pounds. But gained about 20 back as he was less active due to his wife having 2 large back surgeries. Otherwise doing very well [facility] No CP, SOB or palpitations. Previously was taking Crestor 3x/week but stopped and now LDL up from 88-> 141. TC 236. BP well controlled. No longer needing CPAP.   ROS: All systems negative except as listed in HPI, PMH and Problem List.  Past Medical History  Diagnosis Date  . Atrial flutter     s/p ablation 07/17/08; echo EF 60-65% trivival AI  . Sleep apnea   . Hyperlipidemia   . Hypothyroidism   . GERD (gastroesophageal reflux disease)   . Osteoarthritis   . Dyspnea     chronic  . Bradycardia   . Glucose intolerance (impaired glucose tolerance)   . Micturition syncope     Current Outpatient Prescriptions  Medication Sig Dispense Refill  . amLODipine (NORVASC) 10 MG tablet Take 10 mg by mouth daily.        [name] aspirin 81 MG tablet Take 81 mg by mouth daily.        . benazepril (LOTENSIN) 40 MG tablet Take 40 mg by mouth daily.        . Cinnamon 500 MG TABS Take by mouth daily.        . cloNIDine (CATAPRES) 0.3 MG tablet Take 0.3 mg by mouth 2 (two) times daily.        . diclofenac (VOLTAREN) 75 MG EC tablet TAKE ONE TABLET BY MOUTH TWICE DAILY  60 tablet  3  . finasteride (PROSCAR) 5 MG tablet Take 5 mg by mouth daily.        . fish oil-omega-3 fatty acids 1000 MG capsule Take 2 g by mouth daily.        . hydrochlorothiazide (MICROZIDE) 12.5 MG capsule Take 12.5 mg by mouth daily.        [name] levothyroxine (SYNTHROID) 137 MCG  tablet Take 137 mcg by mouth daily.        . Multiple Vitamins-Minerals (CENTRUM SILVER PO) Take by mouth daily.        [name] omeprazole (PRILOSEC) 20 MG capsule Take 20 mg by mouth daily.        [name] PARoxetine (PAXIL) 40 MG tablet Take 40 mg by mouth every morning.        . rosuvastatin (CRESTOR) 5 MG tablet Take 5 mg by mouth. M,w,f           PHYSICAL EXAM: Filed Vitals:   01/30/11 0815  BP: 132/80  Pulse: 60   General:  Well appearing. No resp difficulty HEENT: normal Neck: supple. JVP flat. Carotids 2+ bilaterally; no bruits. No lymphadenopathy or thryomegaly appreciated. Cor: PMI normal. Regular rate & rhythm. No rubs, gallops. Soft systolic murmur at RSB. S2 well prserved Lungs: clear Abdomen: soft, nontender, nondistended. No hepatosplenomegaly. No bruits or masses. Good bowel sounds. Extremities: no cyanosis, clubbing, rash, edema Neuro: alert & orientedx3, cranial nerves grossly intact. Moves all 4 extremities w/o difficulty. Affect pleasant.   ECG: NSR 60. LAFBE. No ST-T  wave abnormalities.     ASSESSMENT & PLAN:

## 2011-01-30 NOTE — Patient Instructions (Signed)
Your physician wants you to follow-up in: one year in the Heart Failure Clinic at [facility].   You will receive a reminder letter in the mail two months in advance. If you don't receive a letter, please call our office to schedule the follow-up appointment.  Your physician has recommended you make the following change in your medication:  Restart your Crestor 3 times per week

## 2011-01-30 NOTE — Assessment & Plan Note (Signed)
Lipids getting worse despite pretty good diet. Will restart Crestor 3x/week as tolerated. F/u with Dr. Lovell Sheehan. Resume exercise program.

## 2011-01-30 NOTE — Assessment & Plan Note (Addendum)
Blood pressure well controlled.Tolerating clonidine without too much fatigue. Continue current regimen.

## 2011-01-30 NOTE — Assessment & Plan Note (Signed)
No recurrence s/p ablation 

## 2011-03-13 ENCOUNTER — Ambulatory Visit (INDEPENDENT_AMBULATORY_CARE_PROVIDER_SITE_OTHER): Payer: Medicare Other | Admitting: Internal Medicine

## 2011-03-13 ENCOUNTER — Encounter: Payer: Self-pay | Admitting: Internal Medicine

## 2011-03-13 VITALS — BP 140/80 | HR 68 | Temp 98.4°F | Resp 16 | Ht 75.0 in | Wt 251.0 lb

## 2011-03-13 DIAGNOSIS — I10 Essential (primary) hypertension: Secondary | ICD-10-CM

## 2011-03-13 DIAGNOSIS — K219 Gastro-esophageal reflux disease without esophagitis: Secondary | ICD-10-CM

## 2011-03-13 DIAGNOSIS — K2970 Gastritis, unspecified, without bleeding: Secondary | ICD-10-CM

## 2011-03-13 DIAGNOSIS — K2990 Gastroduodenitis, unspecified, without bleeding: Secondary | ICD-10-CM

## 2011-03-13 DIAGNOSIS — E039 Hypothyroidism, unspecified: Secondary | ICD-10-CM

## 2011-03-13 LAB — CBC WITH DIFFERENTIAL/PLATELET
Basophils Absolute: 0 10*3/uL (ref 0.0–0.1)
Eosinophils Relative: 4.2 % (ref 0.0–5.0)
HCT: 40 % (ref 39.0–52.0)
Lymphocytes Relative: 28.9 % (ref 12.0–46.0)
Monocytes Relative: 7.1 % (ref 3.0–12.0)
Neutrophils Relative %: 59.3 % (ref 43.0–77.0)
Platelets: 168 10*3/uL (ref 150.0–400.0)
WBC: 8.1 10*3/uL (ref 4.5–10.5)

## 2011-03-13 MED ORDER — ESOMEPRAZOLE MAGNESIUM 40 MG PO CPDR: 40.0000 mg | DELAYED_RELEASE_CAPSULE | Freq: Every day | ORAL | Status: DC

## 2011-03-13 NOTE — Patient Instructions (Signed)
Take the nexium one a day for the next 6 weeks

## 2011-03-15 NOTE — Progress Notes (Signed)
Subjective:    Patient ID: [name] White, male    DOB: 22-Dec-1935, 75 y.o.   MRN: [id number]  HPI  Patient is 75 year old white male with a history of GERD and osteoarthritis.  He has been taking nonsteroidals increased knee pain. He has developed increasing stomach discomfort that may be early ulcer disease.  We discussed the risks of nonsteroidal therapy and continued his medication lists he has been on the past over-the-counter Prilosec but has not been taking that but discussed the protective nature of taking a PPI if one has to take nonsteroidals if there is risk for development of ulcer disease generally he has been stable from the standpoint of his blood pressure. It has no other symptoms that would raise concern is abdominal pain represents ischemic disease  Review of Systems  Constitutional: Negative for fever and fatigue.  HENT: Negative for hearing loss, congestion, neck pain and postnasal drip.   Eyes: Negative for discharge, redness and visual disturbance.  Respiratory: Negative for cough, shortness of breath and wheezing.   Cardiovascular: Negative for leg swelling.  Gastrointestinal: Negative for abdominal pain, constipation and abdominal distention.  Genitourinary: Negative for urgency and frequency.  Musculoskeletal: Negative for joint swelling and arthralgias.  Skin: Negative for color change and rash.  Neurological: Negative for weakness and light-headedness.  Hematological: Negative for adenopathy.  Psychiatric/Behavioral: Negative for behavioral problems.   Past Medical History  Diagnosis Date  . Atrial flutter     s/p ablation 07/17/08; echo EF 60-65% trivival AI  . Sleep apnea   . Hyperlipidemia   . Hypothyroidism   . GERD (gastroesophageal reflux disease)   . Osteoarthritis   . Dyspnea     chronic  . Bradycardia   . Glucose intolerance (impaired glucose tolerance)   . Micturition syncope     History   Social History  . Marital Status: Married   Spouse Name: N/A    Number of Children: N/A  . Years of Education: N/A   Occupational History  . Not on file.   Social History Main Topics  . Smoking status: Never Smoker   . Smokeless tobacco: Not on file  . Alcohol Use: No  . Drug Use: No  . Sexually Active: Not Currently   Other Topics Concern  . Not on file   Social History Narrative  . No narrative on file    No past surgical history on file.  Family History  Problem Relation Age of Onset  . Multiple sclerosis Other   . Heart failure Other   . Hypertension Other     Allergies  Allergen Reactions  . Codeine     REACTION: Rash  . Statins     Current Outpatient Prescriptions on File Prior to Visit  Medication Sig Dispense Refill  . amLODipine (NORVASC) 10 MG tablet Take 10 mg by mouth daily.        [name] aspirin 81 MG tablet Take 81 mg by mouth daily.        . benazepril (LOTENSIN) 40 MG tablet Take 40 mg by mouth daily.        . Cinnamon 500 MG TABS Take by mouth daily.        . cloNIDine (CATAPRES) 0.3 MG tablet Take 0.3 mg by mouth 2 (two) times daily.        . diclofenac (VOLTAREN) 75 MG EC tablet TAKE ONE TABLET BY MOUTH TWICE DAILY  60 tablet  3  . finasteride (PROSCAR) 5 MG tablet Take 5  mg by mouth daily.        . fish oil-omega-3 fatty acids 1000 MG capsule Take 2 g by mouth daily.        . hydrochlorothiazide (MICROZIDE) 12.5 MG capsule Take 12.5 mg by mouth daily.        [name] levothyroxine (SYNTHROID) 137 MCG tablet Take 137 mcg by mouth daily.        . Multiple Vitamins-Minerals (CENTRUM SILVER PO) Take by mouth daily.        [name] omeprazole (PRILOSEC) 20 MG capsule Take 20 mg by mouth daily.        [name] PARoxetine (PAXIL) 40 MG tablet Take 40 mg by mouth every morning.        . rosuvastatin (CRESTOR) 5 MG tablet Take 5 mg by mouth. M,w,f          BP 140/80  Pulse 68  Temp 98.4 F (36.9 C)  Resp 16  Ht 6\' 3"  (1.905 m)  Wt 251 lb (113.853 kg)  BMI 31.37 kg/m2        Objective:   Physical Exam    Constitutional: He appears well-developed and well-nourished.  HENT:  Head: Normocephalic and atraumatic.  Eyes: Conjunctivae are normal. Pupils are equal, round, and reactive to light.  Neck: Normal range of motion. Neck supple.  Cardiovascular: Normal rate and regular rhythm.   Pulmonary/Chest: Effort normal and breath sounds normal.  Abdominal: Soft. Bowel sounds are normal. There is tenderness.          Assessment & Plan:  Hold nonsteroidal therapy for now until symptoms completely resolve begin Nexium 40 mg daily and a consistent basis. Avoid greasy or spicy foods  Monitor stool color. Patient is been taking Pepto-Bismol which has turned his stools dark he is to discontinue Pepto-Bismol and if the stools remain dark he will do stool cards and return to our office if the stool cultures are positive for occult blood an EGD should be pursued further if the CBC obtained today shows anemia an EGD should be pursued  Addendum. CBC was normal and monitor TSH was within range

## 2011-03-28 ENCOUNTER — Other Ambulatory Visit: Payer: Self-pay | Admitting: Internal Medicine

## 2011-04-20 ENCOUNTER — Encounter: Payer: Self-pay | Admitting: Internal Medicine

## 2011-04-20 ENCOUNTER — Ambulatory Visit (INDEPENDENT_AMBULATORY_CARE_PROVIDER_SITE_OTHER): Payer: Medicare Other | Admitting: Internal Medicine

## 2011-04-20 VITALS — BP 140/70 | Temp 98.2°F | Wt 260.1 lb

## 2011-04-20 DIAGNOSIS — I10 Essential (primary) hypertension: Secondary | ICD-10-CM

## 2011-04-20 DIAGNOSIS — J069 Acute upper respiratory infection, unspecified: Secondary | ICD-10-CM

## 2011-04-20 MED ORDER — BENZONATATE 200 MG PO CAPS: 200.0000 mg | ORAL_CAPSULE | Freq: Three times a day (TID) | ORAL | Status: AC | PRN

## 2011-04-20 NOTE — Progress Notes (Signed)
Subjective:    Patient ID: [name] White, male    DOB: 04/30/1935, 76 y.o.   MRN: [id number]  HPI  68 with 1 wk h/o uri symptoms with NP cough, sinus and chest congestion;  No fever;  Using [employer].  H/o HTN- well controlled and OSA   Past Medical History  Diagnosis Date  . Atrial flutter     s/p ablation 07/17/08; echo EF 60-65% trivival AI  . Sleep apnea   . Hyperlipidemia   . Hypothyroidism   . GERD (gastroesophageal reflux disease)   . Osteoarthritis   . Dyspnea     chronic  . Bradycardia   . Glucose intolerance (impaired glucose tolerance)   . Micturition syncope     History   Social History  . Marital Status: Married    Spouse Name: N/A    Number of Children: N/A  . Years of Education: N/A   Occupational History  . Not on file.   Social History Main Topics  . Smoking status: Never Smoker   . Smokeless tobacco: Not on file  . Alcohol Use: No  . Drug Use: No  . Sexually Active: Not Currently   Other Topics Concern  . Not on file   Social History Narrative  . No narrative on file    No past surgical history on file.  Family History  Problem Relation Age of Onset  . Multiple sclerosis Other   . Heart failure Other   . Hypertension Other     Allergies  Allergen Reactions  . Codeine     REACTION: Rash  . Statins     Current Outpatient Prescriptions on File Prior to Visit  Medication Sig Dispense Refill  . amLODipine (NORVASC) 10 MG tablet Take 10 mg by mouth daily.        [name] aspirin 81 MG tablet Take 81 mg by mouth daily.        . benazepril (LOTENSIN) 40 MG tablet Take 40 mg by mouth daily.        . Cinnamon 500 MG TABS Take by mouth daily.        . cloNIDine (CATAPRES) 0.3 MG tablet Take 0.3 mg by mouth 2 (two) times daily.        . diclofenac (VOLTAREN) 75 MG EC tablet TAKE ONE TABLET BY MOUTH TWICE DAILY  60 tablet  6  . esomeprazole (NEXIUM) 40 MG capsule Take 1 capsule (40 mg total) by mouth daily.  30 capsule  1  . finasteride  (PROSCAR) 5 MG tablet Take 5 mg by mouth daily.        . fish oil-omega-3 fatty acids 1000 MG capsule Take 2 g by mouth daily.        . hydrochlorothiazide (MICROZIDE) 12.5 MG capsule Take 12.5 mg by mouth daily.        [name] levothyroxine (SYNTHROID) 137 MCG tablet Take 137 mcg by mouth daily.        . Multiple Vitamins-Minerals (CENTRUM SILVER PO) Take by mouth daily.        [name] omeprazole (PRILOSEC) 20 MG capsule Take 20 mg by mouth daily.        [name] PARoxetine (PAXIL) 40 MG tablet Take 40 mg by mouth every morning.        . rosuvastatin (CRESTOR) 5 MG tablet Take 5 mg by mouth. M,w,f          BP 140/70  Temp(Src) 98.2 F (36.8 C) (Oral)  Wt 260 lb 1.3  oz (117.972 kg)       Review of Systems  Constitutional: Negative for fever, chills, appetite change and fatigue.  HENT: Positive for congestion, rhinorrhea and sinus pressure. Negative for hearing loss, ear pain, sore throat, trouble swallowing, neck stiffness, dental problem, voice change and tinnitus.   Eyes: Negative for pain, discharge and visual disturbance.  Respiratory: Positive for cough. Negative for chest tightness, wheezing and stridor.   Cardiovascular: Negative for chest pain, palpitations and leg swelling.  Gastrointestinal: Negative for nausea, vomiting, abdominal pain, diarrhea, constipation, blood in stool and abdominal distention.  Genitourinary: Negative for urgency, hematuria, flank pain, discharge, difficulty urinating and genital sores.  Musculoskeletal: Negative for myalgias, back pain, joint swelling, arthralgias and gait problem.  Skin: Negative for rash.  Neurological: Negative for dizziness, syncope, speech difficulty, weakness, numbness and headaches.  Hematological: Negative for adenopathy. Does not bruise/bleed easily.  Psychiatric/Behavioral: Negative for behavioral problems and dysphoric mood. The patient is not nervous/anxious.        Objective:   Physical Exam  Constitutional: He is oriented to  person, place, and time. He appears well-developed.  HENT:  Head: Normocephalic.  Right Ear: External ear normal.  Left Ear: External ear normal.  Eyes: Conjunctivae and EOM are normal.  Neck: Normal range of motion.  Cardiovascular: Normal rate and normal heart sounds.   Pulmonary/Chest: Breath sounds normal.  Abdominal: Bowel sounds are normal.  Musculoskeletal: Normal range of motion. He exhibits no edema and no tenderness.  Neurological: He is alert and oriented to person, place, and time.  Psychiatric: He has a normal mood and affect. His behavior is normal.          Assessment & Plan:    URI- we'll treat symptomatically HTN-stable  HLD- SAMPLES CRESTOR DISPENSED

## 2011-04-20 NOTE — Patient Instructions (Signed)
Get plenty of rest, Drink lots of  clear liquids, and use Tylenol or ibuprofen for fever and discomfort.    Mucinex DM one twice daily 

## 2011-05-15 ENCOUNTER — Encounter: Payer: Self-pay | Admitting: Internal Medicine

## 2011-05-15 ENCOUNTER — Telehealth: Payer: Self-pay

## 2011-05-15 ENCOUNTER — Ambulatory Visit (INDEPENDENT_AMBULATORY_CARE_PROVIDER_SITE_OTHER): Payer: Medicare Other | Admitting: Internal Medicine

## 2011-05-15 DIAGNOSIS — I4891 Unspecified atrial fibrillation: Secondary | ICD-10-CM

## 2011-05-15 DIAGNOSIS — I10 Essential (primary) hypertension: Secondary | ICD-10-CM

## 2011-05-15 DIAGNOSIS — I498 Other specified cardiac arrhythmias: Secondary | ICD-10-CM

## 2011-05-15 DIAGNOSIS — E039 Hypothyroidism, unspecified: Secondary | ICD-10-CM

## 2011-05-15 DIAGNOSIS — I480 Paroxysmal atrial fibrillation: Secondary | ICD-10-CM

## 2011-05-15 DIAGNOSIS — I4892 Unspecified atrial flutter: Secondary | ICD-10-CM

## 2011-05-15 LAB — BASIC METABOLIC PANEL
CO2: 30 mEq/L (ref 19–32)
Chloride: 102 mEq/L (ref 96–112)
Glucose, Bld: 97 mg/dL (ref 70–99)
Potassium: 4.7 mEq/L (ref 3.5–5.1)
Sodium: 140 mEq/L (ref 135–145)

## 2011-05-15 LAB — TSH: TSH: 1.64 u[IU]/mL (ref 0.35–5.50)

## 2011-05-15 NOTE — Patient Instructions (Addendum)
The patient is instructed to continue all medications as prescribed. Schedule followup with check out clerk upon leaving the clinic  

## 2011-05-15 NOTE — Telephone Encounter (Signed)
Fu call °Pt returning your call  °

## 2011-05-15 NOTE — Progress Notes (Signed)
Subjective:    Patient ID: [name] White, male    DOB: [birth year], 76 y.o.   MRN: [id number]  HPI  Has several questions: Formerly he had to "work hard" to get his heart rate to change but now it jumps fast with minimal efforts and occasionally it seens to "jump" up fast. He has no other symptoms, no dizzy spells or no chest pains.  Hx of ablation, AF 2009 Has a cardiologist:  calcinosis on the flexor tendon of the fourth digit of the left hand.  This appears to be Dupuytren's contracture although he does not have any trigger finger symptoms at this time.      Review of Systems  Constitutional: Negative for fever and fatigue.  HENT: Negative for hearing loss, congestion, neck pain and postnasal drip.   Eyes: Negative for discharge, redness and visual disturbance.  Respiratory: Negative for cough, shortness of breath and wheezing.   Cardiovascular: Negative for leg swelling.  Gastrointestinal: Negative for abdominal pain, constipation and abdominal distention.  Genitourinary: Negative for urgency and frequency.  Musculoskeletal: Negative for joint swelling and arthralgias.  Skin: Negative for color change and rash.  Neurological: Negative for weakness and light-headedness.  Hematological: Negative for adenopathy.  Psychiatric/Behavioral: Negative for behavioral problems.   Past Medical History  Diagnosis Date  . Atrial flutter     s/p ablation 07/17/08; echo EF 60-65% trivival AI  . Sleep apnea   . Hyperlipidemia   . Hypothyroidism   . GERD (gastroesophageal reflux disease)   . Osteoarthritis   . Dyspnea     chronic  . Bradycardia   . Glucose intolerance (impaired glucose tolerance)   . Micturition syncope     History   Social History  . Marital Status: Married    Spouse Name: N/A    Number of Children: N/A  . Years of Education: N/A   Occupational History  . Not on file.   Social History Main Topics  . Smoking status: Never Smoker   . Smokeless tobacco:  Not on file  . Alcohol Use: No  . Drug Use: No  . Sexually Active: Not Currently   Other Topics Concern  . Not on file   Social History Narrative  . No narrative on file    No past surgical history on file.  Family History  Problem Relation Age of Onset  . Multiple sclerosis Other   . Heart failure Other   . Hypertension Other     Allergies  Allergen Reactions  . Codeine     REACTION: Rash  . Statins     Current Outpatient Prescriptions on File Prior to Visit  Medication Sig Dispense Refill  . amLODipine (NORVASC) 10 MG tablet Take 10 mg by mouth daily.        [name] aspirin 81 MG tablet Take 81 mg by mouth daily.        . benazepril (LOTENSIN) 40 MG tablet Take 40 mg by mouth daily.        . Cinnamon 500 MG TABS Take by mouth daily.        . cloNIDine (CATAPRES) 0.3 MG tablet Take 0.3 mg by mouth 2 (two) times daily.        . diclofenac (VOLTAREN) 75 MG EC tablet TAKE ONE TABLET BY MOUTH TWICE DAILY  60 tablet  6  . esomeprazole (NEXIUM) 40 MG capsule Take 1 capsule (40 mg total) by mouth daily.  30 capsule  1  . finasteride (PROSCAR) 5 MG tablet  Take 5 mg by mouth daily.        . fish oil-omega-3 fatty acids 1000 MG capsule Take 2 g by mouth daily.        . hydrochlorothiazide (MICROZIDE) 12.5 MG capsule Take 12.5 mg by mouth daily.        [name] levothyroxine (SYNTHROID) 137 MCG tablet Take 137 mcg by mouth daily.        . Multiple Vitamins-Minerals (CENTRUM SILVER PO) Take by mouth daily.        [name] omeprazole (PRILOSEC) 20 MG capsule Take 20 mg by mouth daily.        [name] PARoxetine (PAXIL) 40 MG tablet Take 40 mg by mouth every morning.        . rosuvastatin (CRESTOR) 5 MG tablet Take 5 mg by mouth. M,w,f          BP 140/90  Pulse 72  Temp 98.2 F (36.8 C)  Resp 16  Ht 6' 3.5" (1.918 m)  Wt 254 lb (115.214 kg)  BMI 31.33 kg/m2        Objective:   Physical Exam  Nursing note and vitals reviewed. Constitutional: He appears well-developed and well-nourished.    HENT:  Head: Normocephalic and atraumatic.  Eyes: Conjunctivae are normal. Pupils are equal, round, and reactive to light.  Neck: Normal range of motion. Neck supple.  Cardiovascular: Normal rate and regular rhythm.   Pulmonary/Chest: Effort normal and breath sounds normal.  Abdominal: Soft. Bowel sounds are normal.          Assessment & Plan:  At rest the patient's heart rate is between 60 and 70 and stable he appears to be in sinus rhythm. He does state that when the [place] health care nurse came to the house that she did detect that he had some bigeminy or trigeminy. I do not detect that at this point. The patient AB having some paroxysmal atrial fibrillation and is on the monitor may be necessary to catch these episodes and help to diagnose him

## 2011-05-17 ENCOUNTER — Encounter (INDEPENDENT_AMBULATORY_CARE_PROVIDER_SITE_OTHER): Payer: Medicare Other

## 2011-05-17 DIAGNOSIS — I4891 Unspecified atrial fibrillation: Secondary | ICD-10-CM

## 2011-06-20 ENCOUNTER — Telehealth (HOSPITAL_COMMUNITY): Payer: Self-pay | Admitting: *Deleted

## 2011-06-20 NOTE — Telephone Encounter (Signed)
Called pt with results from event monitor which showed episodes of A-Fib, scheduled him an appt for 3/26 at 10:45 at [facility] to discuss with Dr Gala Romney

## 2011-06-25 ENCOUNTER — Ambulatory Visit (INDEPENDENT_AMBULATORY_CARE_PROVIDER_SITE_OTHER): Payer: Medicare Other | Admitting: Internal Medicine

## 2011-06-25 ENCOUNTER — Encounter: Payer: Self-pay | Admitting: Internal Medicine

## 2011-06-25 VITALS — BP 158/82 | HR 72 | Ht 75.0 in | Wt 260.8 lb

## 2011-06-25 DIAGNOSIS — E785 Hyperlipidemia, unspecified: Secondary | ICD-10-CM

## 2011-06-25 DIAGNOSIS — I482 Chronic atrial fibrillation, unspecified: Secondary | ICD-10-CM | POA: Insufficient documentation

## 2011-06-25 DIAGNOSIS — I4891 Unspecified atrial fibrillation: Secondary | ICD-10-CM

## 2011-06-25 LAB — LIPID PANEL
LDL Cholesterol: 92 mg/dL (ref 0–99)
Total CHOL/HDL Ratio: 2
Triglycerides: 61 mg/dL (ref 0.0–149.0)

## 2011-06-25 MED ORDER — APIXABAN 5 MG PO TABS: 5.0000 mg | ORAL_TABLET | Freq: Two times a day (BID) | ORAL | Status: DC

## 2011-06-25 NOTE — Assessment & Plan Note (Signed)
Due for repeat lipids. Continue Crestor.

## 2011-06-25 NOTE — Assessment & Plan Note (Addendum)
Reviewed monitor with him personally. Having paroxysms of AF. Not clearly symptomatic. Discussed rhythm vs rate control at length as well as warfarin vs new anti-coagulants. Will start apixiban and see back in 1 month. If becomes symptomatic consider starting flecainide or other AA. Ned to watch for bradycardia so Tikosyn may also be consideration. Encouraged him to lose weight.

## 2011-06-25 NOTE — Patient Instructions (Signed)
Your physician recommends that you schedule a follow-up appointment in: ONE MONTH  START ELIQUIS 5 MG ONE TABLET TWICE DAILY  Your physician recommends that you return for lab work in: TODAY

## 2011-06-25 NOTE — Progress Notes (Signed)
HPI:  [name] White is a delightful 76 year old male with a history of dyspnea with a normal Myoview in April 2007.  He also has a history of micturition syncope, hypertension, hyperlipidemia,borderline diabetes, obesity, sleep apnea. Has a. flutter and underwent a. flutter ablation by Dr. Ladona Ridgel.  He returns for routine f/u. Feeling pretty good. Saw a nurse at work last month and told he had an irregular heart rate. Had monitor which showed multiple episodes of PAF[place] [facility] Notices HR is different previously started 50-60 now starts around 80. Goes up to 105 usually. A few times up to 120.  No CP, SOB or palpitations. Had problems tolerating statin past. Back taking Crestor 5mg  3x/week. Had lost 50 pounds down to 220 now back up to 260. OSA got better with weight loss but now back using CPAP.   BP well controlled at home.   Lab Results  Component Value Date   CHOL 236* 11/12/2010   HDL 81.90 11/12/2010   LDLCALC 116* 04/03/2009   LDLDIRECT 141.6 11/12/2010   TRIG 60.0 11/12/2010   CHOLHDL 3 11/12/2010     ROS: All systems negative except as listed in HPI, PMH and Problem List.  Past Medical History  Diagnosis Date  . Atrial flutter     s/p ablation 07/17/08; echo EF 60-65% trivival AI  . Sleep apnea   . Hyperlipidemia   . Hypothyroidism   . GERD (gastroesophageal reflux disease)   . Osteoarthritis   . Dyspnea     chronic  . Bradycardia   . Glucose intolerance (impaired glucose tolerance)   . Micturition syncope     Current Outpatient Prescriptions  Medication Sig Dispense Refill  . amLODipine (NORVASC) 10 MG tablet Take 10 mg by mouth daily.        [name] aspirin 81 MG tablet Take 81 mg by mouth daily.        . benazepril (LOTENSIN) 40 MG tablet Take 40 mg by mouth daily.        . Cinnamon 500 MG TABS Take by mouth daily.        . cloNIDine (CATAPRES) 0.3 MG tablet Take 0.3 mg by mouth 2 (two) times daily.        . diclofenac (VOLTAREN) 75 MG EC  tablet TAKE ONE TABLET BY MOUTH TWICE DAILY  60 tablet  6  . esomeprazole (NEXIUM) 40 MG capsule Take 1 capsule (40 mg total) by mouth daily.  30 capsule  1  . finasteride (PROSCAR) 5 MG tablet Take 5 mg by mouth daily.        . fish oil-omega-3 fatty acids 1000 MG capsule Take 2 g by mouth daily.        . hydrochlorothiazide (MICROZIDE) 12.5 MG capsule Take 12.5 mg by mouth daily.        [name] levothyroxine (SYNTHROID) 137 MCG tablet Take 137 mcg by mouth daily.        . Multiple Vitamins-Minerals (CENTRUM SILVER PO) Take by mouth daily.        [name] omeprazole (PRILOSEC) 20 MG capsule Take 20 mg by mouth daily.        [name] PARoxetine (PAXIL) 40 MG tablet Take 40 mg by mouth every morning.        . rosuvastatin (CRESTOR) 5 MG tablet Take 5 mg by mouth. M,w,f           PHYSICAL EXAM: Filed Vitals:   06/25/11 1044  BP: 158/82  Pulse:  72   General:  Well appearing. No resp difficulty HEENT: normal Neck: supple. JVP flat. Carotids 2+ bilaterally; no bruits. No lymphadenopathy or thryomegaly appreciated. Cor: PMI normal. Irregular rate & rhythm. No rubs, gallops. Soft systolic murmur at RSB. S2 well prserved Lungs: clear Abdomen: soft, nontender, nondistended. No hepatosplenomegaly. No bruits or masses. Good bowel sounds. Extremities: no cyanosis, clubbing, rash, edema Neuro: alert & orientedx3, cranial nerves grossly intact. Moves all 4 extremities w/o difficulty. Affect pleasant.   ECG: Afib 74 No ST-T wave abnormalities.      ASSESSMENT & PLAN:

## 2011-06-27 ENCOUNTER — Telehealth: Payer: Self-pay | Admitting: Internal Medicine

## 2011-06-27 ENCOUNTER — Encounter: Payer: Self-pay | Admitting: *Deleted

## 2011-06-27 NOTE — Telephone Encounter (Signed)
Will send to Dr Bensimhon  

## 2011-06-27 NOTE — Telephone Encounter (Signed)
Can stop asa

## 2011-06-27 NOTE — Telephone Encounter (Signed)
Pt aware.

## 2011-06-27 NOTE — Telephone Encounter (Signed)
Pt was put on Eliquis and wondering if should be taking it with the 81mg  asa? pls call to advise

## 2011-06-27 NOTE — Telephone Encounter (Signed)
Addended by: Noralee Space on: 06/27/2011 04:44 PM   Modules accepted: Orders

## 2011-07-15 ENCOUNTER — Ambulatory Visit (INDEPENDENT_AMBULATORY_CARE_PROVIDER_SITE_OTHER): Payer: Medicare Other | Admitting: Internal Medicine

## 2011-07-15 VITALS — BP 152/90 | HR 68 | Temp 98.2°F | Resp 16 | Ht 75.5 in | Wt 254.0 lb

## 2011-07-15 DIAGNOSIS — Z7901 Long term (current) use of anticoagulants: Secondary | ICD-10-CM

## 2011-07-15 DIAGNOSIS — T50904A Poisoning by unspecified drugs, medicaments and biological substances, undetermined, initial encounter: Secondary | ICD-10-CM

## 2011-07-15 DIAGNOSIS — T50901A Poisoning by unspecified drugs, medicaments and biological substances, accidental (unintentional), initial encounter: Secondary | ICD-10-CM

## 2011-07-15 DIAGNOSIS — I4891 Unspecified atrial fibrillation: Secondary | ICD-10-CM

## 2011-07-30 ENCOUNTER — Ambulatory Visit (HOSPITAL_COMMUNITY)
Admission: RE | Admit: 2011-07-30 | Discharge: 2011-07-30 | Disposition: A | Discharge status: Home / Self Care | Payer: Medicare Other | Source: Ambulatory Visit | Attending: Internal Medicine | Admitting: Internal Medicine

## 2011-07-30 VITALS — BP 134/78 | HR 60 | Wt 254.2 lb

## 2011-07-30 DIAGNOSIS — I4891 Unspecified atrial fibrillation: Secondary | ICD-10-CM

## 2011-07-30 DIAGNOSIS — I509 Heart failure, unspecified: Secondary | ICD-10-CM | POA: Insufficient documentation

## 2011-07-30 NOTE — Progress Notes (Signed)
HPI:  [name] White is a delightful 76 year old male with a history of dyspnea with a normal Myoview in April 2007.  He also has a history of micturition syncope, hypertension, hyperlipidemia,borderline diabetes, obesity, sleep apnea. Has a. flutter and underwent a. flutter ablation by Dr. Ladona Ridgel (07/17/08).  Follow up:  Patient went into A-fib about a month ago and since has been very fatigued. Patient not able to tell if he is out of rhythm, but reports checking his heart rate regularly. Going to the [facility] and riding stationary bicycle regularly. Reports heart racing to upper 120's sometimes when just walking around and with no strenuous activity, but denies any CP or dyspnea with it. Wearing CPAP at night.  Overall patient reports feeling pretty good, other than being fatigued. Denies SOB, PND, and chest pain.  BP well controlled at home 120-130/70s  Lab Results  Component Value Date   CHOL 196 06/25/2011   HDL 91.70 06/25/2011   LDLCALC 92 06/25/2011   LDLDIRECT 141.6 11/12/2010   TRIG 61.0 06/25/2011   CHOLHDL 2 06/25/2011     ROS: All systems negative except as listed in HPI, PMH and Problem List.  Past Medical History  Diagnosis Date  . Atrial flutter     s/p ablation 07/17/08; echo EF 60-65% trivival AI  . Sleep apnea   . Hyperlipidemia   . Hypothyroidism   . GERD (gastroesophageal reflux disease)   . Osteoarthritis   . Dyspnea     chronic  . Bradycardia   . Glucose intolerance (impaired glucose tolerance)   . Micturition syncope     Current Outpatient Prescriptions  Medication Sig Dispense Refill  . amLODipine (NORVASC) 10 MG tablet Take 10 mg by mouth daily.        [name] Apixaban 5 MG TABS Take 5 mg by mouth 2 (two) times daily.  60 tablet  6  . benazepril (LOTENSIN) 40 MG tablet Take 40 mg by mouth daily.        . Cinnamon 500 MG TABS Take by mouth daily.        . cloNIDine (CATAPRES) 0.3 MG tablet Take 0.3 mg by mouth 2 (two) times daily.        . diclofenac (VOLTAREN) 75 MG EC  tablet TAKE ONE TABLET BY MOUTH TWICE DAILY  60 tablet  6  . esomeprazole (NEXIUM) 40 MG capsule Take 1 capsule (40 mg total) by mouth daily.  30 capsule  1  . finasteride (PROSCAR) 5 MG tablet Take 5 mg by mouth daily.        . fish oil-omega-3 fatty acids 1000 MG capsule Take 2 g by mouth daily.        . hydrochlorothiazide (MICROZIDE) 12.5 MG capsule Take 12.5 mg by mouth daily.        [name] levothyroxine (SYNTHROID) 137 MCG tablet Take 137 mcg by mouth daily.        . Multiple Vitamins-Minerals (CENTRUM SILVER PO) Take by mouth daily.        [name] omeprazole (PRILOSEC) 20 MG capsule Take 20 mg by mouth daily.        [name] PARoxetine (PAXIL) 40 MG tablet Take 40 mg by mouth every morning.        . rosuvastatin (CRESTOR) 5 MG tablet Take 5 mg by mouth. M,w,f           PHYSICAL EXAM: Filed Vitals:   07/30/11 1349  BP: 134/78  Pulse: 60   General:  Well appearing. No resp difficulty HEENT:  normal Neck: supple. JVP flat. Carotids 2+ bilaterally; no bruits. No lymphadenopathy or thryomegaly appreciated. Cor: PMI normal. Regular rate & rhythm. No rubs, gallops. Soft systolic murmur at RSB. S2 well prserved Lungs: CTA upper and lower lobes Abdomen: soft, nontender, nondistended. No hepatosplenomegaly. No bruits or masses. Good bowel sounds. Extremities: no cyanosis, clubbing, rash, edema Neuro: alert & orientedx3, cranial nerves grossly intact. Moves all 4 extremities w/o difficulty. Affect pleasant.      ASSESSMENT & PLAN:

## 2011-07-30 NOTE — Assessment & Plan Note (Addendum)
Patient currently still in and out atrial fibrillation and reports marked fatigue. Discussed Tikosyn and will schedule for next week.  Patient seen and examined with [name], NP. We discussed all aspects of the encounter. I agree with the assessment and plan as stated above. Long talk about strategies of rate control vs rhythm control. He remains quite symptomatic with PAF episodes. Will admit next week for [facility] load. Continue anti-coagulation.

## 2011-07-30 NOTE — Patient Instructions (Addendum)
Start Extra Strength Tylenol 500mg  2 tablets up to 3x a day for pain. If pain increases then try and taking diclofenac, 1 tablet.   Will call patient when bed available next Tuesday for Tikosyn load.  Follow up as needed.

## 2011-07-30 NOTE — Assessment & Plan Note (Signed)
Patient states tried being off diclofenac, however pain increased and is currently taking 75mg  BID. Discussed increased risk for bleeding with with Apixaban. Recommended taking 500mg  extra strength tylenol up to 2 tablets 3x a day, and if pain still increased then take one dose of diclofenac

## 2011-08-12 ENCOUNTER — Inpatient Hospital Stay (HOSPITAL_COMMUNITY)
Admission: AD | Admit: 2011-08-12 | Discharge: 2011-08-14 | DRG: 310 | Disposition: A | Discharge status: Home / Self Care | Payer: Medicare Other | Source: Ambulatory Visit | Attending: Internal Medicine | Admitting: Internal Medicine

## 2011-08-12 ENCOUNTER — Encounter (HOSPITAL_COMMUNITY): Payer: Self-pay | Admitting: General Practice

## 2011-08-12 DIAGNOSIS — I10 Essential (primary) hypertension: Secondary | ICD-10-CM | POA: Diagnosis present

## 2011-08-12 DIAGNOSIS — M1990 Unspecified osteoarthritis, unspecified site: Secondary | ICD-10-CM | POA: Diagnosis present

## 2011-08-12 DIAGNOSIS — E669 Obesity, unspecified: Secondary | ICD-10-CM | POA: Diagnosis present

## 2011-08-12 DIAGNOSIS — R0989 Other specified symptoms and signs involving the circulatory and respiratory systems: Secondary | ICD-10-CM

## 2011-08-12 DIAGNOSIS — E785 Hyperlipidemia, unspecified: Secondary | ICD-10-CM | POA: Diagnosis present

## 2011-08-12 DIAGNOSIS — I4892 Unspecified atrial flutter: Secondary | ICD-10-CM

## 2011-08-12 DIAGNOSIS — E039 Hypothyroidism, unspecified: Secondary | ICD-10-CM | POA: Diagnosis present

## 2011-08-12 DIAGNOSIS — R0609 Other forms of dyspnea: Secondary | ICD-10-CM

## 2011-08-12 DIAGNOSIS — R7309 Other abnormal glucose: Secondary | ICD-10-CM | POA: Diagnosis present

## 2011-08-12 DIAGNOSIS — Z79899 Other long term (current) drug therapy: Secondary | ICD-10-CM

## 2011-08-12 DIAGNOSIS — G4733 Obstructive sleep apnea (adult) (pediatric): Secondary | ICD-10-CM | POA: Diagnosis present

## 2011-08-12 DIAGNOSIS — K219 Gastro-esophageal reflux disease without esophagitis: Secondary | ICD-10-CM | POA: Diagnosis present

## 2011-08-12 DIAGNOSIS — I4891 Unspecified atrial fibrillation: Principal | ICD-10-CM | POA: Diagnosis present

## 2011-08-12 HISTORY — DX: Personal history of other diseases of the digestive system: Z87.19

## 2011-08-12 HISTORY — DX: Anxiety disorder, unspecified: F41.9

## 2011-08-12 HISTORY — DX: Essential (primary) hypertension: I10

## 2011-08-12 HISTORY — DX: Cardiac murmur, unspecified: R01.1

## 2011-08-12 LAB — BASIC METABOLIC PANEL
BUN: 17 mg/dL (ref 6–23)
BUN: 19 mg/dL (ref 6–23)
CO2: 27 mEq/L (ref 19–32)
CO2: 27 mEq/L (ref 19–32)
Calcium: 9.4 mg/dL (ref 8.4–10.5)
Calcium: 9.3 mg/dL (ref 8.4–10.5)
Creatinine, Ser: 0.87 mg/dL (ref 0.50–1.35)
Creatinine, Ser: 0.81 mg/dL (ref 0.50–1.35)
GFR calc non Af Amer: 83 mL/min — ABNORMAL LOW (ref >90)
GFR calc non Af Amer: 85 mL/min — ABNORMAL LOW (ref >90)
Glucose, Bld: 91 mg/dL (ref 70–99)
Glucose, Bld: 119 mg/dL — ABNORMAL HIGH (ref 70–99)
Sodium: 136 mEq/L (ref 135–145)
Sodium: 137 mEq/L (ref 135–145)

## 2011-08-12 MED ORDER — SODIUM CHLORIDE 0.9 % IJ SOLN
3.0000 mL | Freq: Two times a day (BID) | INTRAMUSCULAR | Status: DC
  Administered 2011-08-12 – 2011-08-13 (×2): 3 mL via INTRAVENOUS

## 2011-08-12 MED ORDER — POTASSIUM CHLORIDE CRYS ER 20 MEQ PO TBCR
40.0000 meq | EXTENDED_RELEASE_TABLET | Freq: Once | ORAL | Status: AC
  Administered 2011-08-12: 40 meq via ORAL
  Filled 2011-08-12: qty 2

## 2011-08-12 MED ORDER — DOFETILIDE 500 MCG PO CAPS
500.0000 ug | ORAL_CAPSULE | Freq: Two times a day (BID) | ORAL | Status: DC
  Administered 2011-08-12: 500 ug via ORAL
  Filled 2011-08-12 (×3): qty 1

## 2011-08-12 MED ORDER — SODIUM CHLORIDE 0.9 % IV SOLN: 250.0000 mL | INTRAVENOUS | Status: DC | PRN

## 2011-08-12 MED ORDER — PAROXETINE HCL 20 MG PO TABS
40.0000 mg | ORAL_TABLET | Freq: Every day | ORAL | Status: DC
  Administered 2011-08-13 – 2011-08-14 (×2): 40 mg via ORAL
  Filled 2011-08-12 (×2): qty 2

## 2011-08-12 MED ORDER — SODIUM CHLORIDE 0.9 % IJ SOLN
3.0000 mL | Freq: Two times a day (BID) | INTRAMUSCULAR | Status: DC
  Administered 2011-08-13: 3 mL via INTRAVENOUS

## 2011-08-12 MED ORDER — SODIUM CHLORIDE 0.9 % IJ SOLN: 3.0000 mL | INTRAMUSCULAR | Status: DC | PRN

## 2011-08-12 MED ORDER — LEVOTHYROXINE SODIUM 137 MCG PO TABS
137.0000 ug | ORAL_TABLET | Freq: Every day | ORAL | Status: DC
  Administered 2011-08-13 – 2011-08-14 (×2): 137 ug via ORAL
  Filled 2011-08-12 (×2): qty 1

## 2011-08-12 MED ORDER — AMLODIPINE BESYLATE 10 MG PO TABS
10.0000 mg | ORAL_TABLET | Freq: Every day | ORAL | Status: DC
  Administered 2011-08-13 – 2011-08-14 (×2): 10 mg via ORAL
  Filled 2011-08-12 (×2): qty 1

## 2011-08-12 MED ORDER — BENAZEPRIL HCL 40 MG PO TABS
40.0000 mg | ORAL_TABLET | Freq: Every day | ORAL | Status: DC
  Administered 2011-08-13 – 2011-08-14 (×2): 40 mg via ORAL
  Filled 2011-08-12 (×2): qty 1

## 2011-08-12 MED ORDER — FINASTERIDE 5 MG PO TABS
5.0000 mg | ORAL_TABLET | Freq: Every day | ORAL | Status: DC
  Administered 2011-08-13 – 2011-08-14 (×2): 5 mg via ORAL
  Filled 2011-08-12 (×2): qty 1

## 2011-08-12 MED ORDER — ACETAMINOPHEN 325 MG PO TABS: 650.0000 mg | ORAL_TABLET | Freq: Four times a day (QID) | ORAL | Status: DC | PRN

## 2011-08-12 MED ORDER — POTASSIUM CHLORIDE CRYS ER 20 MEQ PO TBCR
80.0000 meq | EXTENDED_RELEASE_TABLET | Freq: Once | ORAL | Status: AC
  Administered 2011-08-12: 80 meq via ORAL
  Filled 2011-08-12: qty 4

## 2011-08-12 MED ORDER — ACETAMINOPHEN 650 MG RE SUPP: 650.0000 mg | Freq: Four times a day (QID) | RECTAL | Status: DC | PRN

## 2011-08-12 MED ORDER — MAGNESIUM SULFATE 40 MG/ML IJ SOLN
2.0000 g | Freq: Once | INTRAMUSCULAR | Status: AC
  Administered 2011-08-12: 2 g via INTRAVENOUS
  Filled 2011-08-12: qty 50

## 2011-08-12 MED ORDER — PANTOPRAZOLE SODIUM 40 MG PO TBEC
40.0000 mg | DELAYED_RELEASE_TABLET | Freq: Every day | ORAL | Status: DC
  Administered 2011-08-13: 40 mg via ORAL
  Filled 2011-08-12: qty 1

## 2011-08-12 MED ORDER — OMEGA-3 FATTY ACIDS 1000 MG PO CAPS: 2.0000 g | ORAL_CAPSULE | Freq: Every day | ORAL | Status: DC

## 2011-08-12 MED ORDER — CLONIDINE HCL 0.3 MG PO TABS
0.1500 mg | ORAL_TABLET | Freq: Two times a day (BID) | ORAL | Status: DC
  Administered 2011-08-12 – 2011-08-14 (×4): 0.15 mg via ORAL
  Filled 2011-08-12 (×5): qty 0.5

## 2011-08-12 MED ORDER — OMEGA-3-ACID ETHYL ESTERS 1 G PO CAPS
2.0000 g | ORAL_CAPSULE | Freq: Every day | ORAL | Status: DC
  Administered 2011-08-13 – 2011-08-14 (×2): 2 g via ORAL
  Filled 2011-08-12 (×3): qty 2

## 2011-08-12 MED ORDER — APIXABAN 5 MG PO TABS
5.0000 mg | ORAL_TABLET | Freq: Two times a day (BID) | ORAL | Status: DC
  Administered 2011-08-12 – 2011-08-14 (×4): 5 mg via ORAL
  Filled 2011-08-12 (×5): qty 1

## 2011-08-12 MED ORDER — DICLOFENAC SODIUM 75 MG PO TBEC
75.0000 mg | DELAYED_RELEASE_TABLET | Freq: Two times a day (BID) | ORAL | Status: DC
  Filled 2011-08-12 (×3): qty 1

## 2011-08-12 NOTE — H&P (Signed)
Primary Cardiologist:  Dr. Gala Romney  Reason for Admission: Tikosyn Load  HPI:    [name] White is a delightful 76 year old male with a history of micturition syncope, hypertension, hyperlipidemia, borderline diabetes, obesity, as well as sleep apnea (wears CPAP). He also has history of atrial flutter and underwent ablation by Dr. Ladona Ridgel (07/17/08).    About a month or two ago the patient went into atrial fibrillation with noticeable fatigue.  He has noted that when he goes to the [facility] his heart rate climbs to the 120s.  Other than being fatigued he is doing fairly well.  He denies SOB/orthopnea/PND.  No chest pain.    With patient's symptomatic atrial fibrillation episodes strategies of rate vs rhythm control were discussed at the last office visit and it was decided that patient would be admitted for Tikosyn load.  He will be continued on apixaban.    Review of Systems: [y] = yes, [ ]  = no   General: Weight gain [ ] ; Weight loss [ ] ; Anorexia [ ] ; Fatigue [place] ]; Fever [ ] ; Chills [ ] ; Weakness [ ]   Cardiac: Chest pain/pressure [ ] ; Resting SOB [ ] ; Exertional SOB [ ] ; Orthopnea [ ] ; Pedal Edema [ ] ; Palpitations [ ] ; Syncope [ ] ; Presyncope [ ] ; Paroxysmal nocturnal dyspnea[ ]   Pulmonary: Cough [ ] ; Wheezing[ ] ; Hemoptysis[ ] ; Sputum [ ] ; Snoring [ ]   GI: Vomiting[ ] ; Dysphagia[ ] ; Melena[ ] ; Hematochezia [ ] ; Heartburn[ ] ; Abdominal pain [ ] ; Constipation [ ] ; Diarrhea [ ] ; BRBPR [ ]   GU: Hematuria[ ] ; Dysuria [ ] ; Nocturia[ ]   Vascular: Pain in legs with walking [ ] ; Pain in feet with lying flat [ ] ; Non-healing sores [ ] ; Stroke [ ] ; TIA [ ] ; Slurred speech [ ] ;  Neuro: Headaches[ ] ; Vertigo[ ] ; Seizures[ ] ; Paresthesias[ ] ;Blurred vision [ ] ; Diplopia [ ] ; Vision changes [ ]   Ortho/Skin: Arthritis [ ] ; Joint pain [ ] ; Muscle pain [ ] ; Joint swelling [ ] ; Back Pain [ ] ; Rash [ ]   Psych: Depression[ ] ; Anxiety[ ]   Heme: Bleeding problems [ ] ; Clotting disorders [ ] ; Anemia [ ]   Endocrine:  Diabetes [ ] ; Thyroid dysfunction[ ]   Home Medications Prior to Admission medications   Medication Sig Start Date End Date Taking? Authorizing Provider  acetaminophen (TYLENOL) 500 MG tablet Take 500 mg by mouth 2 (two) times daily.   Yes Historical Provider, MD  amLODipine (NORVASC) 10 MG tablet Take 10 mg by mouth daily.    Yes Historical Provider, MD  apixaban (ELIQUIS) 5 MG TABS tablet Take 5 mg by mouth 2 (two) times daily. 06/25/11  Yes Bevelyn Buckles Tully Mcinturff, MD  benazepril (LOTENSIN) 40 MG tablet Take 40 mg by mouth daily.    Yes Historical Provider, MD  Cinnamon 500 MG TABS Take 1 tablet by mouth daily.    Yes Historical Provider, MD  cloNIDine (CATAPRES) 0.3 MG tablet Take 0.15 mg by mouth 2 (two) times daily.    Yes Historical Provider, MD  diclofenac (VOLTAREN) 75 MG EC tablet Take 75 mg by mouth 2 (two) times daily.   Yes Historical Provider, MD  finasteride (PROSCAR) 5 MG tablet Take 5 mg by mouth daily.    Yes Historical Provider, MD  fish oil-omega-3 fatty acids 1000 MG capsule Take 2 g by mouth daily.    Yes Historical Provider, MD  hydrochlorothiazide (MICROZIDE) 12.5 MG capsule Take 12.5 mg by mouth daily.     Yes Historical Provider, MD  levothyroxine (SYNTHROID) 137 MCG tablet Take 137 mcg by mouth daily.    Yes Historical Provider, MD  Multiple Vitamins-Minerals (CENTRUM SILVER PO) Take 0.5 tablets by mouth daily.    Yes Historical Provider, MD  omeprazole (PRILOSEC) 20 MG capsule Take 20 mg by mouth daily.    Yes Historical Provider, MD  PARoxetine (PAXIL) 40 MG tablet Take 40 mg by mouth every morning.    Yes Historical Provider, MD  rosuvastatin (CRESTOR) 5 MG tablet Take 2.5 mg by mouth 3 (three) times a week. M,w,f    Yes Historical Provider, MD    Past Medical History: Past Medical History  Diagnosis Date  . Atrial flutter     s/p ablation 07/17/08; echo EF 60-65% trivival AI  . Sleep apnea   . Hyperlipidemia   . Hypothyroidism   . GERD (gastroesophageal reflux  disease)   . Osteoarthritis   . Dyspnea     chronic  . Bradycardia   . Glucose intolerance (impaired glucose tolerance)   . Micturition syncope     Past Surgical History: No past surgical history on file.  Family History: Family History  Problem Relation Age of Onset  . Multiple sclerosis Other   . Heart failure Other   . Hypertension Other     Social History: History   Social History  . Marital Status: Married    Spouse Name: N/A    Number of Children: N/A  . Years of Education: N/A   Social History Main Topics  . Smoking status: Never Smoker   . Smokeless tobacco: Not on file  . Alcohol Use: No  . Drug Use: No  . Sexually Active: Not Currently   Other Topics Concern  . Not on file   Social History Narrative  . No narrative on file    Allergies:  Allergies  Allergen Reactions  . Statins Other (See Comments)    Made very sick, doctor thought had heart attack  . Codeine Rash    REACTION: Rash    Objective:    Vital Signs:   Temp:  [97.5 F (36.4 C)] 97.5 F (36.4 C) (05/13 1250) Pulse Rate:  [66] 66  (05/13 1250) Resp:  [18] 18  (05/13 1250) BP: (135)/(79) 135/79 mmHg (05/13 1250) SpO2:  [97 %] 97 % (05/13 1250) Weight:  [113.2 kg (249 lb 9 oz)] 113.2 kg (249 lb 9 oz) (05/13 1250)   Filed Weights   08/12/11 1250  Weight: 113.2 kg (249 lb 9 oz)    Physical Exam: General:  Well appearing. No resp difficulty HEENT: normal Neck: supple. JVP flat. Carotids 2+ bilat; no bruits. No lymphadenopathy or thryomegaly appreciated. Cor: PMI nondisplaced. Regular rate & rhythm. Soft systolic murmur at RSB.  S2 crisp.   Lungs: clear Abdomen: soft, nontender, nondistended. No hepatosplenomegaly. No bruits or masses. Good bowel sounds. Extremities: no cyanosis, clubbing, rash, edema Neuro: alert & orientedx3, cranial nerves grossly intact. moves all 4 extremities w/o difficulty. Affect pleasant   Labs: Basic Metabolic Panel:  Lab 08/12/11 1478  NA 136    K 3.4*  CL 99  CO2 27  GLUCOSE 91  BUN 17  CREATININE 0.87  CALCIUM 9.4  MG 1.9  PHOS --    Liver Function Tests: No results found for this basename: AST:5,ALT:5,ALKPHOS:5,BILITOT:5,PROT:5,ALBUMIN:5 in the last 168 hours No results found for this basename: LIPASE:5,AMYLASE:5 in the last 168 hours No results found for this basename: AMMONIA:3 in the last 168 hours  CBC: No results found for  this basename: WBC:5,NEUTROABS:5,HGB:5,HCT:5,MCV:5,PLT:5 in the last 168 hours  Cardiac Enzymes: No results found for this basename: CKTOTAL:5,CKMB:5,CKMBINDEX:5,TROPONINI:5 in the last 168 hours  BNP: BNP (last 3 results) No results found for this basename: PROBNP:3 in the last 8760 hours  CBG: No results found for this basename: GLUCAP:5 in the last 168 hours  Coagulation Studies: No results found for this basename: LABPROT:5,INR:5 in the last 72 hours  Other results: EKG: pending  Imaging:  No results found.      Assessment:   1.  PAF     -on apixaban 2. HTN 3. HL 4. H/o Afl s/p ablation in 2010 5. OSA on CPAP  Plan/Discussion:    [name] has been admitted to the telemetry unit for Tikosyn load.  Will check BMET and magnesium prior to initiation of tikosyn.  Have stopped HCTZ with the initiation of tikosyn.  Will continue apixaban.    Length of Stay: 0  Robbi Garter, [facility] 08/12/2011, 1:36 PM   Patient seen and examined with Ulyess Blossom, PA-C. We discussed all aspects of the encounter. I agree with the assessment and plan as stated above. He continues to have good days and bad days which i suspect are related to being in/out of rhythm. He is also concerned about potential underlying CAD. Will admit for Tikosyn load. Supp K+ and Mag. Will plan Cardiac CT to evaluate for underlying CAD as potential contributor to his exertional dyspnea and fatigue.   Truman Hayward 5:17 PM

## 2011-08-12 NOTE — Progress Notes (Addendum)
Admit Complaint: 76 y.o.  male  admitted 08/12/2011 with atrial fibrillation to be initiated on dofetilide.   Assessment:  Prior to the first dose the EKG has been obtained and initial QTc reported as   ms </= 440 ms, </= 550 msec with and ICD or </=600 msec if patient has ventricular conduction abnormalities.  Electrolytes have been evaluated and are within goal (K >/= 4 mEq/L, Magnesium >/= 1.8 mEq/L: Last potassium:  Lab Results  Component Value Date   K 3.4* 08/12/2011      Last magnesium:  Lab Results  Component Value Date   MG 1.9 08/12/2011       Current creatinine clearance is reported as: Estimated Creatinine Clearance: 101.1 ml/min (by C-G formula based on Cr of 0.87).  Patients medication list has been reviewed and is currently not taking any other antiarrhythmics or medications contraindicated with dofetilide (verapamil, cimetidine, hydrochlorothiazide, trimethoprim, itraconazole, ketoconazole, prochlorperazine, megestrol, phenothiazines, erythromycin, tricyclic or tetracyclic antidepressants, H1 antihistamines or moxifloxacin).   He has taken * ,a class 1 antiarrhythmic in the past, but this was * ago, over two half lives.   Amiodarone has not been taken within the last 3 months and/or the serum concentration is *, less than 0.3 ng/ml.  Patient is not currently receiving digoxin or digoxin concentration is reported as:  No results found for this basename: DIGOXIN    Patient  has been appropriately anticoagulated with apixaban.  Ordering provider was confirmed at [email or web address] or [phone] [facility] DEA # [id number] and the providers DEA#)  Goal of Therapy:  Follow renal function, electrolytes, potential drug interactions. Provide education kit and 1 week supply at discharge.  Plan:  1.  MD has ordered to Initiate dofetilide at 500 mcg PO bid based on renal function, PA confirms that physician has credentials to Rx dofetilide. Orders have been placed to  replace low K+ with plans for f/u labs at 19:00. Start dofetilide as K+ and QTc allows. HCTZ has been stopped :  Calculated CrCl  Dose q12h   > 60 ml/min 500 mcg   40-60 ml/min 250 mcg   20-40 ml/min 125 mcg   2. Follow up QTc after the first 5 doses, renal function, electrolytes (K & Mg) daily x 3 days, success of initiation and facilitate 1 week discharge supply as clinically indicated. 3. Initiate Tikosyn education video (Call [phone] and ask for video  # [phone])  Juliette Alcide, PharmD, BCPS.  Pager: [phone] 08/12/2011 3:56 PM

## 2011-08-13 ENCOUNTER — Inpatient Hospital Stay (HOSPITAL_COMMUNITY): Payer: Medicare Other

## 2011-08-13 LAB — MAGNESIUM: Magnesium: 2.2 mg/dL (ref 1.5–2.5)

## 2011-08-13 LAB — BASIC METABOLIC PANEL
BUN: 15 mg/dL (ref 6–23)
Creatinine, Ser: 0.73 mg/dL (ref 0.50–1.35)
GFR calc Af Amer: >90 mL/min (ref >90)
GFR calc non Af Amer: 89 mL/min — ABNORMAL LOW (ref >90)

## 2011-08-13 MED ORDER — NITROGLYCERIN 0.4 MG SL SUBL
SUBLINGUAL_TABLET | SUBLINGUAL | Status: AC
  Filled 2011-08-13: qty 25

## 2011-08-13 MED ORDER — LOSARTAN POTASSIUM 25 MG PO TABS
25.0000 mg | ORAL_TABLET | Freq: Every day | ORAL | Status: DC
  Filled 2011-08-13: qty 1

## 2011-08-13 MED ORDER — METOPROLOL TARTRATE 1 MG/ML IV SOLN: 5.0000 mg | INTRAVENOUS | Status: DC | PRN

## 2011-08-13 MED ORDER — DICLOFENAC SODIUM 75 MG PO TBEC
75.0000 mg | DELAYED_RELEASE_TABLET | Freq: Two times a day (BID) | ORAL | Status: DC | PRN
Start: 2011-08-13 — End: 2011-08-14
  Filled 2011-08-13: qty 1

## 2011-08-13 MED ORDER — DOFETILIDE 500 MCG PO CAPS
500.0000 ug | ORAL_CAPSULE | Freq: Two times a day (BID) | ORAL | Status: DC
  Administered 2011-08-13 – 2011-08-14 (×4): 500 ug via ORAL
  Filled 2011-08-13 (×5): qty 1

## 2011-08-13 MED ORDER — POTASSIUM CHLORIDE CRYS ER 20 MEQ PO TBCR
40.0000 meq | EXTENDED_RELEASE_TABLET | Freq: Once | ORAL | Status: AC
  Administered 2011-08-13: 40 meq via ORAL
  Filled 2011-08-13: qty 2

## 2011-08-13 MED ORDER — NITROGLYCERIN 0.3 MG SL SUBL
0.3000 mg | SUBLINGUAL_TABLET | SUBLINGUAL | Status: DC | PRN
  Administered 2011-08-13: 0.3 mg via SUBLINGUAL
  Filled 2011-08-13: qty 100

## 2011-08-13 MED ORDER — IOHEXOL 350 MG/ML SOLN
80.0000 mL | Freq: Once | INTRAVENOUS | Status: AC | PRN
  Administered 2011-08-13: 80 mL via INTRAVENOUS

## 2011-08-13 MED ORDER — METOPROLOL TARTRATE 1 MG/ML IV SOLN
INTRAVENOUS | Status: AC
  Filled 2011-08-13: qty 10

## 2011-08-13 NOTE — Care Management Note (Signed)
    Page 1 of 1   08/13/2011     11:16:25 AM   CARE MANAGEMENT NOTE 08/13/2011  Patient:  LADARIOUS, KRESSE   Account Number:  [id number]  Date Initiated:  08/13/2011  Documentation initiated by:  SIMMONS,Sussie Minor  Subjective/Objective Assessment:   RECEIVED REFERRAL FOR TIKOSYN ASSISTANCE; DISCUSSED WITH PT AT BEDSIDE. HIS [facility] IN [place] DOES NOT CARRY TIKOSYN NOR DOES [facility].     Action/Plan:   CONTACTED [facility] TO ORDER TIKOSYN FOR PT;  PT IS AWARE HE WILL NEED TO GET TIKOSYN FROM [facility] AT [facility] AT DISCHARGE.   Anticipated DC Date:  08/15/2011   Anticipated DC Plan:  HOME/SELF CARE      DC Planning Services  Medication Assistance  CM consult      Choice offered to / List presented to:             Status of service:  Completed, signed off Medicare Important Message given?   (If response is "NO", the following Medicare IM given date fields will be blank) Date Medicare IM given:   Date Additional Medicare IM given:    Discharge Disposition:  HOME/SELF CARE  Per UR Regulation:  Reviewed for med. necessity/level of care/duration of stay  If discussed at Long Length of Stay Meetings, dates discussed:    Comments:  08/13/11  1115   Otoniel Myhand SIMMONS RN, BSN [phone] MD/ NP-  PLEASE WRITE 2 SCRIPTS FOR TIKOSYN-  ONE FOR 7 DAY SUPPLY AT DISCHARGE AND ANOTHER FOR 30 DAY SUPPLY. THANKS!

## 2011-08-13 NOTE — Progress Notes (Addendum)
Subjective:    [name] has been admitted for Tikosyn load for symptomatic PAF.  On apixaban.  K+ 4.1 after repletion last night, Mg 1.9, Qtc 435, given 1 dose of Tikosyn 500 mcg.  Feels good.  Slept well last night.     Objective:   Weight Range:  Vital Signs:   Temp:  [96.8 F (36 C)-97.5 F (36.4 C)] 97 F (36.1 C) (05/14 0300) Pulse Rate:  [53-66] 53  (05/14 0300) Resp:  [12-18] 12  (05/14 0300) BP: (129-139)/(77-84) 131/84 mmHg (05/14 0300) SpO2:  [94 %-97 %] 94 % (05/14 0300) Weight:  [113.2 kg (249 lb 9 oz)] 113.2 kg (249 lb 9 oz) (05/13 1250) Last BM Date: 08/11/11  Weight change: Filed Weights   08/12/11 1250  Weight: 113.2 kg (249 lb 9 oz)    Intake/Output:   Intake/Output Summary (Last 24 hours) at 08/13/11 0556 Last data filed at 08/13/11 0505  Gross per 24 hour  Intake    240 ml  Output      0 ml  Net    240 ml     Physical Exam: General:  Well appearing. No resp difficulty HEENT: normal Neck: supple. No JVD . Carotids 2+ bilat; no bruits. No lymphadenopathy or thryomegaly appreciated. Cor: PMI nondisplaced. Regular rate & rhythm. No rubs, gallops or murmurs. Lungs: clear Abdomen: soft, nontender, nondistended. No hepatosplenomegaly. No bruits or masses. Good bowel sounds. Extremities: no cyanosis, clubbing, rash, edema Neuro: alert & orientedx3, cranial nerves grossly intact. moves all 4 extremities w/o difficulty. Affect pleasant  Telemetry: Sinus brady 40-50s  Labs: Basic Metabolic Panel:  Lab 08/12/11 1610 08/12/11 1336  NA 137 136  K 4.1 3.4*  CL 102 99  CO2 27 27  GLUCOSE 119* 91  BUN 19 17  CREATININE 0.81 0.87  CALCIUM 9.3 9.4  MG -- 1.9  PHOS -- --    Liver Function Tests: No results found for this basename: AST:5,ALT:5,ALKPHOS:5,BILITOT:5,PROT:5,ALBUMIN:5 in the last 168 hours No results found for this basename: LIPASE:5,AMYLASE:5 in the last 168 hours No results found for this basename: AMMONIA:3 in the last 168  hours  CBC: No results found for this basename: WBC:5,NEUTROABS:5,HGB:5,HCT:5,MCV:5,PLT:5 in the last 168 hours  Cardiac Enzymes: No results found for this basename: CKTOTAL:5,CKMB:5,CKMBINDEX:5,TROPONINI:5 in the last 168 hours  BNP: BNP (last 3 results) No results found for this basename: PROBNP:3 in the last 8760 hours   Other results:  EKG: Sinus brady 59 with 1st degree AV block, PR 222 ms, Qtc 435 ms   Imaging:  No results found.   Medications:     Scheduled Medications:    . amLODipine  10 mg Oral Daily  . apixaban  5 mg Oral BID  . benazepril  40 mg Oral Daily  . cloNIDine  0.15 mg Oral BID  . diclofenac  75 mg Oral BID  . dofetilide  500 mcg Oral Q12H  . finasteride  5 mg Oral Daily  . levothyroxine  137 mcg Oral Daily  . magnesium sulfate 1 - 4 g bolus IVPB  2 g Intravenous Once  . omega-3 acid ethyl esters  2 g Oral Daily  . pantoprazole  40 mg Oral Q1200  . PARoxetine  40 mg Oral Daily  . potassium chloride  40 mEq Oral Once  . potassium chloride  80 mEq Oral Once  . sodium chloride  3 mL Intravenous Q12H  . sodium chloride  3 mL Intravenous Q12H  . sodium chloride  3 mL Intravenous Q12H  . DISCONTD: fish oil-omega-3 fatty acids  2 g Oral Daily     Infusions:     PRN Medications:  sodium chloride, sodium chloride, acetaminophen, acetaminophen, sodium chloride, sodium chloride   Assessment:   1. PAF  -on apixaban  2. HTN  3. HL  4. H/o Afl s/p ablation in 2010  5. OSA on CPAP  Plan/Discussion:    He is tolerating tikosyn at this time.  Will continue tikosyn and apixaban.  May need to add lisinopril for hypertension as HCTZ has been discontinued.  He would like to have another med for his arthritis, will discuss with Dr. Gala Romney.    Length of Stay: 1   Robbi Garter 08/13/2011, 5:56 AM  Patient seen and examined with Ulyess Blossom, PA-C. We discussed all aspects of the encounter. I agree with the assessment and plan as stated  above. Doing well continue Tikosyn load. Consider Tramadol for arthritis. Will order cardiac CT to assess for underlying CAD as component of his sx.   Jaylenn Altier,MD 6:21 AM

## 2011-08-13 NOTE — Progress Notes (Signed)
UR Completed. Simmons, Zona Pedro F [phone]  

## 2011-08-14 DIAGNOSIS — I2510 Atherosclerotic heart disease of native coronary artery without angina pectoris: Secondary | ICD-10-CM

## 2011-08-14 LAB — BASIC METABOLIC PANEL
BUN: 12 mg/dL (ref 6–23)
Calcium: 9.5 mg/dL (ref 8.4–10.5)
Creatinine, Ser: 0.8 mg/dL (ref 0.50–1.35)
GFR calc Af Amer: >90 mL/min (ref >90)
GFR calc non Af Amer: 86 mL/min — ABNORMAL LOW (ref >90)

## 2011-08-14 MED ORDER — TRAMADOL HCL 50 MG PO TABS: 50.0000 mg | ORAL_TABLET | Freq: Four times a day (QID) | ORAL | Status: DC | PRN

## 2011-08-14 MED ORDER — TRAMADOL HCL 50 MG PO TABS
50.0000 mg | ORAL_TABLET | Freq: Two times a day (BID) | ORAL | Status: DC | PRN
  Filled 2011-08-14: qty 1

## 2011-08-14 MED ORDER — DOFETILIDE 500 MCG PO CAPS: 500.0000 ug | ORAL_CAPSULE | Freq: Two times a day (BID) | ORAL | Status: DC

## 2011-08-14 MED ORDER — TRAMADOL HCL 50 MG PO TABS
50.0000 mg | ORAL_TABLET | Freq: Two times a day (BID) | ORAL | Status: AC | PRN
Start: 2011-08-14 — End: 2011-08-24

## 2011-08-14 NOTE — Discharge Summary (Signed)
Discharge Summary   Patient ID: [name] White MRN: [id number], DOB/AGE: [id number] 74 y.o. Admit date: 08/12/2011 D/C date:     08/14/2011   Primary Discharge Diagnoses:  1. PAF, in NSR     -on apixaban  2. HTN  3. Bradycardia, asymptomatic  4. HL  5. H/o Afl s/p ablation in 2010  6. OSA on CPAP  Secondary Discharge Diagnoses:  1. GERD  Hospital Course:  [name] is a 76 y.o. gentlemen with history of micturition syncope, hypertension, hyperlipidemia, borderline diabetes, obesity, as well as sleep apnea (wears CPAP) and atrial flutter status post ablation in 2010.  Over the last month or so the patient has noted paroxysmal atrial fibrillation with noticeable fatigue.  With his symptomatic PAF the patient was admitted for Tikosyn load to maintain SR.  He has been on apixaban.  He was admitted on 5/13 to the telemetry unit.  EKG: Sinus brady 59 with 1st degree AV block, PR 222 ms, Qtc 435 ms.  Initial K was 3.5 therefore it was supplemented with repeat K of 4.1.  Mg 1.9 which was also supplemented to 2.2.  Tikosyn started on 5/13.  HCTZ stopped due to interaction with Tikosyn.  Patient remained in sinus rhythm throughout the hospital stay.  No arrhthymias noted on telemetry.  EKG 45 bpm with Qtc 416 ms.  He will complete 5 doses of Tikosyn prior to discharge.    He has noted dyspnea with exertion that was felt to be from his atrial fibrillation but to assess for underlying CAD a cardiac CT was obtained during this stay.  Results showed prominent mixed plaque in the proximal LAD with moderate (50-70%) stenosis. (This may be overestimated due to blooming artifact from prominent calcification).  Calcium score noted to be 355 Agatston units which is in a moderate risk category because of his age. Will continue statin at this time but has been unable to titrate in the past due to myalgias.     Dr. Gala White evaluated the patient on day of discharge and felt him stable for home after is last Tikosyn  dose this evening.  He will be provided with a short supply of Tikosyn at discharge.  He has noted that his arthritis is not always controlled with tylenol.  We will try tramadol to see if this demonstrates better pain control.  Of note there is a cross reactivity with codeine which he said causes itching/rash.  Have discussed this with pharmacy, the patient and Dr. Gala White.  We will try tramadol and he will watch for rash/itching.    Discharge Vitals: Blood pressure 137/85, pulse 50, temperature 97.8 F (36.6 C), temperature source Oral, resp. rate 18, height 6\' 3"  (1.905 m), weight 113.2 kg (249 lb 9 oz), SpO2 98.00%.  Physical Exam:  General: Well appearing. No resp difficulty  HEENT: normal  Neck: supple. No JVD . Carotids 2+ bilat; no bruits. No lymphadenopathy or thryomegaly appreciated.  Cor: PMI nondisplaced. Regular rate & rhythm. No rubs, gallops or murmurs.  Lungs: clear  Abdomen: soft, nontender, nondistended. No hepatosplenomegaly. No bruits or masses. Good bowel sounds.  Extremities: no cyanosis, clubbing, rash, edema  Neuro: alert & orientedx3, cranial nerves grossly intact. moves all 4 extremities w/o difficulty. Affect pleasant   Telemetry: Sinus brady 50s  Labs: Lab Results  Component Value Date   WBC 8.1 03/13/2011   HGB 13.3 03/13/2011   HCT 40.0 03/13/2011   MCV 93.8 03/13/2011   PLT 168.0 03/13/2011  Lab 08/14/11 0450  NA 138  K 5.0  CL 104  CO2 27  BUN 12  CREATININE 0.80  CALCIUM 9.5  PROT --  BILITOT --  ALKPHOS --  ALT --  AST --  GLUCOSE 112*   No results found for this basename: CKTOTAL:4,CKMB:4,TROPONINI:4 in the last 72 hours Lab Results  Component Value Date   CHOL 196 06/25/2011   HDL 91.70 06/25/2011   LDLCALC 92 06/25/2011   TRIG 61.0 06/25/2011   No results found for this basename: DDIMER    Diagnostic Studies/Procedures   Ct Coronary Morp W/cta Cor W/score W/ca W/cm &/or Wo/cm  08/13/2011  *RADIOLOGY REPORT*  Clinical Data:  CARDIAC CTA WITH CALCIUM SCORE 08/13/2011 14:49:00  Ordering Physician: Lesle Reek  Reading Physician: DaltonS.Mclean  Protocol:  The patient scanned on a Philips 256 slice scanner. Prospective gating with 5% phase tolerance at 120 kV was used. Average heart rate during the scan was 60 beats per minute.  After an initial AP and lateral topogram, 3 mm axial slices were performed through the heart for calcium scoring.  The patient then had an 80 ml bolus of contrast given for coronary CTA with bolus tracking in the descending thoracic aorta.  The 3-D data set was then sent to the [employer].  Reconstructions were done using MIP,MPR and VRT modes.  Indications: Assess for CAD  DETAILED FINDINGS:  Quality of Study: Good  Left Main: No plaque or stenosis.  Left Anterior Descending: Prominent mixed plaque in the proximal LAD with probably moderate (50-70%) stenosis.  The first diagonal was a small vessel with calcified plaque at the ostium (could be significant stenosis but cannot say for sure due to blooming artifact from the calcium).  Left Circumflex: Mixed plaque in the proximal LCx with mild stenosis.  Right Coronary Artery: Mixed plaque in the mid RCA with mild stenosis.  Dominant vessel.  Coronary Calcium Score: 355 Agatston units  IMPRESSION: 1. Prominent mixed plaque in the proximal LAD with moderate (50- 70%) stenosis.  This may be overestimated due to blooming artifact from prominent calcification.  If patient is symptomatic (chest pain), cath may be warranted.  If the patient does not have significant symptoms, this could be medically managed.  2. Calcium score 355 Agatston units.  This actually only places the patient in a moderate risk category because of his age - he is in the 63rd percentile for age and gender.  3. Non-cardiac findings to be reported by [facility].  CT HEAR MORPH WITH CTA COR WITH SCORE WITH CA WITH CONTRAST AND OR WITHOUT CONTRAST  Contrast: 80mL OMNIPAQUE IOHEXOL 350  MG/ML SOLN  Comparison: None.  Findings:  IMPRESSION:  Original Report Authenticated By: [facility]    Discharge Medications   Medication List  As of 08/14/2011  3:30 PM   STOP taking these medications         diclofenac 75 MG EC tablet      hydrochlorothiazide 12.5 MG capsule         TAKE these medications         acetaminophen 500 MG tablet   Commonly known as: TYLENOL   Take 500 mg by mouth 2 (two) times daily.      amLODipine 10 MG tablet   Commonly known as: NORVASC   Take 10 mg by mouth daily.      apixaban 5 MG Tabs tablet   Commonly known as: ELIQUIS   Take 5 mg by mouth 2 (two) times daily.  benazepril 40 MG tablet   Commonly known as: LOTENSIN   Take 40 mg by mouth daily.      CENTRUM SILVER PO   Take 0.5 tablets by mouth daily.      Cinnamon 500 MG Tabs   Take 1 tablet by mouth daily.      cloNIDine 0.3 MG tablet   Commonly known as: CATAPRES   Take 0.15 mg by mouth 2 (two) times daily.      dofetilide 500 MCG capsule   Commonly known as: TIKOSYN   Take 1 capsule (500 mcg total) by mouth every 12 (twelve) hours.      finasteride 5 MG tablet   Commonly known as: PROSCAR   Take 5 mg by mouth daily.      fish oil-omega-3 fatty acids 1000 MG capsule   Take 2 g by mouth daily.      omeprazole 20 MG capsule   Commonly known as: PRILOSEC   Take 20 mg by mouth daily.      PARoxetine 40 MG tablet   Commonly known as: PAXIL   Take 40 mg by mouth every morning.      rosuvastatin 5 MG tablet   Commonly known as: CRESTOR   Take 2.5 mg by mouth 3 (three) times a week. M,w,f        SYNTHROID 137 MCG tablet   Generic drug: levothyroxine   Take 137 mcg by mouth daily.      traMADol 50 MG tablet   Commonly known as: ULTRAM   Take 1 tablet (50 mg total) by mouth every 12 (twelve) hours as needed.            Disposition   The patient will be discharged in stable condition to home. Discharge Orders    Future Appointments: Provider:  Department: Dept Phone: Center:   08/30/2011 10:30 AM [facility] [phone] None   10/14/2011 11:15 AM Stacie Glaze, MD [facility] [phone] [facility]     Future Orders Please Complete By Expires   Diet - low sodium heart healthy      Increase activity slowly      Call MD for:  persistant dizziness or light-headedness      Call MD for:  extreme fatigue      Call MD for:  redness, tenderness, or signs of infection (pain, swelling, redness, odor or green/yellow discharge around incision site)        Follow-up Information    Follow up with Arvilla Meres, MD on 08/30/2011. (at 10:30a  Barnett Hatter [phone]))    Contact information:   [facility] [address] [place] [address] [phone]            Duration of Discharge Encounter: Greater than 30 minutes including physician and PA time.  Signed, Robbi Garter PA-C 08/14/2011, 3:30 PM  Patient seen and examined with Ulyess Blossom, PA-C on day of discharge. We discussed all aspects of the encounter. I agree with the assessment and plan as stated above. Tikosyn load has gone well. Maintaining SR. Reviewed results of cardiac CT in detail. I suspect DOE mostly due to AF but if symptoms persist may need cath to further evaluate. Continue Crestor (can only tolerate low dose.  Jasmon Graffam,MD 4:13 PM

## 2011-08-30 ENCOUNTER — Ambulatory Visit (HOSPITAL_COMMUNITY)
Admit: 2011-08-30 | Discharge: 2011-08-30 | Disposition: A | Discharge status: Home / Self Care | Payer: Medicare Other | Attending: Internal Medicine | Admitting: Internal Medicine

## 2011-08-30 VITALS — BP 114/60 | HR 55 | Wt 248.0 lb

## 2011-08-30 DIAGNOSIS — I4892 Unspecified atrial flutter: Secondary | ICD-10-CM | POA: Insufficient documentation

## 2011-08-30 DIAGNOSIS — E785 Hyperlipidemia, unspecified: Secondary | ICD-10-CM

## 2011-08-30 DIAGNOSIS — I4891 Unspecified atrial fibrillation: Secondary | ICD-10-CM | POA: Insufficient documentation

## 2011-08-30 DIAGNOSIS — R7309 Other abnormal glucose: Secondary | ICD-10-CM | POA: Insufficient documentation

## 2011-08-30 DIAGNOSIS — K219 Gastro-esophageal reflux disease without esophagitis: Secondary | ICD-10-CM | POA: Insufficient documentation

## 2011-08-30 DIAGNOSIS — E039 Hypothyroidism, unspecified: Secondary | ICD-10-CM | POA: Insufficient documentation

## 2011-08-30 DIAGNOSIS — R55 Syncope and collapse: Secondary | ICD-10-CM | POA: Insufficient documentation

## 2011-08-30 DIAGNOSIS — E669 Obesity, unspecified: Secondary | ICD-10-CM | POA: Insufficient documentation

## 2011-08-30 DIAGNOSIS — M1990 Unspecified osteoarthritis, unspecified site: Secondary | ICD-10-CM

## 2011-08-30 DIAGNOSIS — I10 Essential (primary) hypertension: Secondary | ICD-10-CM | POA: Insufficient documentation

## 2011-08-30 DIAGNOSIS — G4730 Sleep apnea, unspecified: Secondary | ICD-10-CM | POA: Insufficient documentation

## 2011-08-30 DIAGNOSIS — R0989 Other specified symptoms and signs involving the circulatory and respiratory systems: Secondary | ICD-10-CM | POA: Insufficient documentation

## 2011-08-30 DIAGNOSIS — F411 Generalized anxiety disorder: Secondary | ICD-10-CM | POA: Insufficient documentation

## 2011-08-30 DIAGNOSIS — I498 Other specified cardiac arrhythmias: Secondary | ICD-10-CM | POA: Insufficient documentation

## 2011-08-30 DIAGNOSIS — R0609 Other forms of dyspnea: Secondary | ICD-10-CM | POA: Insufficient documentation

## 2011-08-30 DIAGNOSIS — I2510 Atherosclerotic heart disease of native coronary artery without angina pectoris: Secondary | ICD-10-CM

## 2011-08-30 MED ORDER — NAPROXEN 250 MG PO TABS: 250.0000 mg | ORAL_TABLET | Freq: Two times a day (BID) | ORAL | Status: DC

## 2011-08-30 NOTE — Progress Notes (Signed)
Patient ID: MILEN LENGACHER, male   DOB: [birth year], 76 y.o.   MRN: [id number]  HPI:  Johanna is a delightful 76 year old male with a history of dyspnea with a normal Myoview in April 2007.  He also has a history of micturition syncope, hypertension, hyperlipidemia,borderline diabetes, obesity, sleep apnea. Has a. flutter and underwent a. flutter ablation by Dr. Ladona Ridgel (07/17/08).  Was admitted earlier this month for Tikosyn load due to symptomatic PAF.  Feels much better with maintenance of NSR. No palpitations or SOB. Energy level much better. Tolerating apixiban without bleeding. On d/c we stopped his diclofenac and switched him to tramadol and tylenol. Since that time his arthritis in his hands and knees has gotten much worse and can barely walk or close his hands.   While in hospital also had cardiac CT. Results showed prominent mixed plaque in the proximal LAD with moderate (50-70%) stenosis. (This may be overestimated due to blooming artifact from prominent calcification). Calcium score noted to be 355 Agatston units which is in a moderate risk category because of his age.    Lab Results  Component Value Date   CHOL 196 06/25/2011   HDL 91.70 06/25/2011   LDLCALC 92 06/25/2011   LDLDIRECT 141.6 11/12/2010   TRIG 61.0 06/25/2011   CHOLHDL 2 06/25/2011     ROS: All systems negative except as listed in HPI, PMH and Problem List.  Past Medical History  Diagnosis Date  . Sleep apnea   . Hyperlipidemia   . Hypothyroidism   . GERD (gastroesophageal reflux disease)   . Osteoarthritis   . Dyspnea     chronic  . Glucose intolerance (impaired glucose tolerance)   . Micturition syncope   . Hypertension   . Heart murmur     "slight"  . Atrial flutter     s/p ablation 07/17/08; echo EF 60-65% trivival AI  . Bradycardia   . H/O hiatal hernia   . Anxiety     Current Outpatient Prescriptions  Medication Sig Dispense Refill  . amLODipine (NORVASC) 10 MG tablet Take 10 mg by mouth daily.         [name] apixaban (ELIQUIS) 5 MG TABS tablet Take 5 mg by mouth 2 (two) times daily.      . benazepril (LOTENSIN) 40 MG tablet Take 40 mg by mouth daily.       . cloNIDine (CATAPRES) 0.3 MG tablet Take 0.15 mg by mouth 2 (two) times daily.       [name] dofetilide (TIKOSYN) 500 MCG capsule Take 500 mcg by mouth every 12 (twelve) hours.      . finasteride (PROSCAR) 5 MG tablet Take 5 mg by mouth daily.       . fish oil-omega-3 fatty acids 1000 MG capsule Take 2 g by mouth daily.       [name] levothyroxine (SYNTHROID) 137 MCG tablet Take 137 mcg by mouth daily.       . Multiple Vitamins-Minerals (CENTRUM SILVER PO) Take 0.5 tablets by mouth daily.       [name] omeprazole (PRILOSEC) 20 MG capsule Take 20 mg by mouth daily.       [name] PARoxetine (PAXIL) 40 MG tablet Take 40 mg by mouth every morning.       . rosuvastatin (CRESTOR) 5 MG tablet Take 2.5 mg by mouth 3 (three) times a week. M,w,f       . DISCONTD: dofetilide (TIKOSYN) 500 MCG capsule Take 1 capsule (500 mcg total) by mouth every 12 (twelve) hours.  60 capsule  6  . acetaminophen (TYLENOL) 500 MG tablet Take 500 mg by mouth 2 (two) times daily.         PHYSICAL EXAM: Filed Vitals:   08/30/11 1021  BP: 114/60  Pulse: 55   General:  Well appearing. No resp difficulty HEENT: normal Neck: supple. JVP flat. Carotids 2+ bilaterally; no bruits. No lymphadenopathy or thryomegaly appreciated. Cor: PMI normal. Regular rate & rhythm. No rubs, gallops. Soft systolic murmur at RSB. S2 well prserved Lungs: CTA upper and lower lobes Abdomen: soft, nontender, nondistended. No hepatosplenomegaly. No bruits or masses. Good bowel sounds. Extremities: no cyanosis, clubbing, rash, edema Neuro: alert & orientedx3, cranial nerves grossly intact. Moves all 4 extremities w/o difficulty. Affect pleasant.   ECG: Sinus bradycardia 58. LAFB  QTc 435   ASSESSMENT & PLAN:

## 2011-08-30 NOTE — Patient Instructions (Signed)
Stop Tramadol and Tylenol Start Naprosen 250 mg Twice daily as needed  We will contact you in 3 months to schedule your next appointment.

## 2011-08-31 DIAGNOSIS — I2510 Atherosclerotic heart disease of native coronary artery without angina pectoris: Secondary | ICD-10-CM | POA: Insufficient documentation

## 2011-08-31 NOTE — Assessment & Plan Note (Signed)
Feels much better with maintenance of NSR. Tolerating TIkosyn well. QTc ok. No bleeding with apixaban. Continue current regimen.

## 2011-08-31 NOTE — Assessment & Plan Note (Signed)
Goal LDL < 70. Has had severe myalgias with statins in past. Currently tolerating low dose Crestor. Will titrate as tolerated.

## 2011-08-31 NOTE — Assessment & Plan Note (Signed)
We recently asked him to stop diclofenac due to risk of bleeding when combined with apixaban therapy. He has failed tramdol and tylenol and is severely limited by his arthritis pain. We again discussed the risks of bleeding, MI, renal dysfunction and HTN with NSAID therapy. I have suggested he switch to naproxen 250 once or twice a day as needed for severe arthitis pain. Stop diclofenac.

## 2011-08-31 NOTE — Assessment & Plan Note (Signed)
Reviewed results of cardiac CT scan with him again. He has ~60% mid LAD lesion. Currently asymptomatic. Reviewed warning signs of angina. Continue aggressive RF reduction. Goal LDL < 70.

## 2011-09-26 ENCOUNTER — Telehealth (HOSPITAL_COMMUNITY): Payer: Self-pay | Admitting: *Deleted

## 2011-09-26 MED ORDER — DOFETILIDE 500 MCG PO CAPS: 500.0000 ug | ORAL_CAPSULE | Freq: Two times a day (BID) | ORAL | Status: DC

## 2011-09-26 NOTE — Telephone Encounter (Signed)
Mr [name] White called today. He needs to see if we can fax a copy of his prescription for Tikosyn to the [facility], he may be able to receive it at a much less price through them. [facility] [phone], [name].  Thanks.

## 2011-09-26 NOTE — Telephone Encounter (Signed)
Pt aware that rx was faxed to [facility]

## 2011-10-14 ENCOUNTER — Ambulatory Visit (INDEPENDENT_AMBULATORY_CARE_PROVIDER_SITE_OTHER): Payer: Medicare Other | Admitting: Internal Medicine

## 2011-10-14 ENCOUNTER — Encounter: Payer: Self-pay | Admitting: Internal Medicine

## 2011-10-14 VITALS — BP 134/78 | HR 68 | Temp 97.9°F | Resp 16 | Ht 75.0 in | Wt 251.0 lb

## 2011-10-14 DIAGNOSIS — I10 Essential (primary) hypertension: Secondary | ICD-10-CM

## 2011-10-14 DIAGNOSIS — I4891 Unspecified atrial fibrillation: Secondary | ICD-10-CM

## 2011-10-14 DIAGNOSIS — E039 Hypothyroidism, unspecified: Secondary | ICD-10-CM

## 2011-10-14 LAB — T3, FREE: T3, Free: 2.5 pg/mL (ref 2.3–4.2)

## 2011-10-14 LAB — TSH: TSH: 2.88 u[IU]/mL (ref 0.35–5.50)

## 2011-10-14 NOTE — Progress Notes (Signed)
Subjective:    Patient ID: [name] White, male    DOB: June 23, 1935, 76 y.o.   MRN: [id number]  HPI patient brings blood work from the [place] hospital which shows his cholesterol is 171 his HDL cholesterol is 86 and his LDL cholesterol 72 for excellent results his triglycerides are 66 his hemoglobin is 14.4 and hematocrit is 43.1 with normal platelets his creatinine is 1.0 and he has normal liver functions his TSH was slightly elevated at 3.90 Heart rate has been stable   Review of Systems  Constitutional: Negative for fever and fatigue.  HENT: Negative for hearing loss, congestion, neck pain and postnasal drip.   Eyes: Negative for discharge, redness and visual disturbance.  Respiratory: Negative for cough, shortness of breath and wheezing.   Cardiovascular: Negative for leg swelling.  Gastrointestinal: Negative for abdominal pain, constipation and abdominal distention.  Genitourinary: Negative for urgency and frequency.  Musculoskeletal: Negative for joint swelling and arthralgias.  Skin: Negative for color change and rash.  Neurological: Negative for weakness and light-headedness.  Hematological: Negative for adenopathy.  Psychiatric/Behavioral: Negative for behavioral problems.   Past Medical History  Diagnosis Date  . Sleep apnea   . Hyperlipidemia   . Hypothyroidism   . GERD (gastroesophageal reflux disease)   . Osteoarthritis   . Dyspnea     chronic  . Glucose intolerance (impaired glucose tolerance)   . Micturition syncope   . Hypertension   . Heart murmur     "slight"  . Atrial flutter     s/p ablation 07/17/08; echo EF 60-65% trivival AI  . Bradycardia   . H/O hiatal hernia   . Anxiety     History   Social History  . Marital Status: Married    Spouse Name: N/A    Number of Children: N/A  . Years of Education: N/A   Occupational History  . Not on file.   Social History Main Topics  . Smoking status: Former Smoker -- 0 years    Types: Pipe    Quit date:  04/01/1996  . Smokeless tobacco: Former Neurosurgeon    Types: Chew    Quit date: 04/01/2009  . Alcohol Use: 3.5 oz/week    7 drink(s) per week     08/12/11 "small glass of bourbon q night"  . Drug Use: No  . Sexually Active: Not Currently   Other Topics Concern  . Not on file   Social History Narrative  . No narrative on file    Past Surgical History  Procedure Date  . Tonsillectomy 1940's  . Total knee arthroplasty 1990's    right  . Cataract extraction w/ intraocular lens  implant, bilateral ~ 2007  . Cardiac electrophysiology mapping and ablation 06/2008    Family History  Problem Relation Age of Onset  . Multiple sclerosis Other   . Heart failure Other   . Hypertension Other     Allergies  Allergen Reactions  . Codeine Itching and Rash  . Statins Other (See Comments)    Made very sick, doctor thought had heart attack; "my appearance, color, etc"    Current Outpatient Prescriptions on File Prior to Visit  Medication Sig Dispense Refill  . amLODipine (NORVASC) 10 MG tablet Take 10 mg by mouth daily.       [name] apixaban (ELIQUIS) 5 MG TABS tablet Take 5 mg by mouth 2 (two) times daily.      . benazepril (LOTENSIN) 40 MG tablet Take 40 mg by mouth daily.       [name]  cloNIDine (CATAPRES) 0.3 MG tablet Take 0.15 mg by mouth 2 (two) times daily.       [name] dofetilide (TIKOSYN) 500 MCG capsule Take 1 capsule (500 mcg total) by mouth every 12 (twelve) hours.  180 capsule  3  . finasteride (PROSCAR) 5 MG tablet Take 5 mg by mouth daily.       . fish oil-omega-3 fatty acids 1000 MG capsule Take 2 g by mouth daily.       [name] levothyroxine (SYNTHROID) 137 MCG tablet Take 137 mcg by mouth daily.       . Multiple Vitamins-Minerals (CENTRUM SILVER PO) Take 0.5 tablets by mouth daily.       . naproxen (NAPROSYN) 250 MG tablet Take 1 tablet (250 mg total) by mouth 2 (two) times daily with a meal.  60 tablet  6  . omeprazole (PRILOSEC) 20 MG capsule Take 20 mg by mouth daily.       [name] PARoxetine  (PAXIL) 40 MG tablet Take 40 mg by mouth every morning.       . rosuvastatin (CRESTOR) 5 MG tablet Take 2.5 mg by mouth 3 (three) times a week. M,w,f         BP 134/78  Pulse 68  Temp 97.9 F (36.6 C)  Resp 16  Ht 6\' 3"  (1.905 m)  Wt 251 lb (113.853 kg)  BMI 31.37 kg/m2        Objective:   Physical Exam  Constitutional: He appears well-developed and well-nourished.  HENT:  Head: Normocephalic and atraumatic.  Eyes: Conjunctivae are normal. Pupils are equal, round, and reactive to light.  Neck: Normal range of motion. Neck supple.  Cardiovascular: Normal rate and regular rhythm.   Pulmonary/Chest: Effort normal and breath sounds normal.  Abdominal: Soft. Bowel sounds are normal.          Assessment & Plan:  Rate controlled on the tikosyn and reviewed blood work form the  [place] Blood pressure is stable Renal function stable Lipids stable on crestor The patients synthroid needs to be adjusted We'll draw a T3-T4 and TSH today to confirm

## 2011-10-14 NOTE — Patient Instructions (Signed)
The patient is instructed to continue all medications as prescribed. Schedule followup with check out clerk upon leaving the clinic  

## 2011-10-16 LAB — T4, FREE: Free T4: 0.78 ng/dL (ref 0.60–1.60)

## 2011-12-25 ENCOUNTER — Telehealth (HOSPITAL_COMMUNITY): Payer: Self-pay | Admitting: Cardiology

## 2011-12-25 DIAGNOSIS — I5022 Chronic systolic (congestive) heart failure: Secondary | ICD-10-CM

## 2011-12-25 NOTE — Telephone Encounter (Signed)
Echo order needed for upcoming appt. Order placed

## 2011-12-29 ENCOUNTER — Encounter: Payer: Self-pay | Admitting: Internal Medicine

## 2011-12-29 NOTE — Progress Notes (Signed)
  Subjective:    Patient ID: [name] White, male    DOB: 14-Apr-1935, 76 y.o.   MRN: [id number]  HPI Patient is a 76 year old male with atrial flutter who was switched from Coumadin to a novel anticoagulant and presents today to monitor the effect of that change.  His comorbid findings include hypothyroidism hypertension prostatic hypertrophy and GERD.  He states that he has not noted any increased bruising or bleeding of any medication and is tolerating it well without side effects   Review of Systems  Eyes: Negative.   Respiratory: Negative.   Cardiovascular: Negative.   Gastrointestinal: Negative.   Skin: Negative.   Hematological: Negative.        Objective:   Physical Exam  Nursing note and vitals reviewed. Constitutional: He is oriented to person, place, and time.  Cardiovascular:       irregular rate and rhythm  Pulmonary/Chest: Effort normal and breath sounds normal.  Abdominal: Soft. Bowel sounds are normal.  Neurological: He is alert and oriented to person, place, and time.  Skin: Skin is warm and dry.          Assessment & Plan:  Stable monitoring for effect of new novel anticoagulants continue this medication and monitor with cardiology as scheduled stable blood pressure

## 2012-01-06 ENCOUNTER — Ambulatory Visit (HOSPITAL_COMMUNITY)
Admission: RE | Admit: 2012-01-06 | Discharge: 2012-01-06 | Disposition: A | Discharge status: Home / Self Care | Payer: Medicare Other | Source: Ambulatory Visit | Attending: Internal Medicine | Admitting: Internal Medicine

## 2012-01-06 ENCOUNTER — Other Ambulatory Visit: Payer: Self-pay

## 2012-01-06 ENCOUNTER — Ambulatory Visit (HOSPITAL_BASED_OUTPATIENT_CLINIC_OR_DEPARTMENT_OTHER)
Admission: RE | Admit: 2012-01-06 | Discharge: 2012-01-06 | Disposition: A | Discharge status: Home / Self Care | Payer: Medicare Other | Source: Ambulatory Visit | Attending: Internal Medicine | Admitting: Internal Medicine

## 2012-01-06 ENCOUNTER — Encounter (HOSPITAL_COMMUNITY): Payer: Self-pay

## 2012-01-06 VITALS — BP 138/78 | HR 57 | Ht 75.0 in | Wt 257.1 lb

## 2012-01-06 DIAGNOSIS — I5022 Chronic systolic (congestive) heart failure: Secondary | ICD-10-CM

## 2012-01-06 DIAGNOSIS — I509 Heart failure, unspecified: Secondary | ICD-10-CM | POA: Insufficient documentation

## 2012-01-06 DIAGNOSIS — I4891 Unspecified atrial fibrillation: Secondary | ICD-10-CM | POA: Insufficient documentation

## 2012-01-06 DIAGNOSIS — I369 Nonrheumatic tricuspid valve disorder, unspecified: Secondary | ICD-10-CM

## 2012-01-06 DIAGNOSIS — I2510 Atherosclerotic heart disease of native coronary artery without angina pectoris: Secondary | ICD-10-CM

## 2012-01-06 DIAGNOSIS — I10 Essential (primary) hypertension: Secondary | ICD-10-CM

## 2012-01-06 DIAGNOSIS — R0989 Other specified symptoms and signs involving the circulatory and respiratory systems: Secondary | ICD-10-CM

## 2012-01-06 DIAGNOSIS — E785 Hyperlipidemia, unspecified: Secondary | ICD-10-CM

## 2012-01-06 NOTE — Assessment & Plan Note (Signed)
Has moderate CAD. Stressed need for risk reduction. Will recheck lipids. Try to get LDL < 100 if tolerated.

## 2012-01-06 NOTE — Progress Notes (Signed)
PCP: Darryll Capers  HPI:  [name] White is a delightful 76 year old male with a history of dyspnea with a normal Myoview in April 2007.  He also has a history of micturition syncope, hypertension, hyperlipidemia,borderline diabetes, obesity, sleep apnea. Has a. flutter and underwent a. flutter ablation by Dr. Ladona Ridgel.  Cardiac CT in 5/13  Left Main: No plaque or stenosis.  Left Anterior Descending: Prominent mixed plaque in the proximal  LAD with probably moderate (50-70%) stenosis. The first diagonal  was a small vessel with calcified plaque at the ostium (could be  significant stenosis but cannot say for sure due to blooming  artifact from the calcium).  Left Circumflex: Mixed plaque in the proximal LCx with mild  stenosis.  Right Coronary Artery: Mixed plaque in the mid RCA with mild  stenosis. Dominant vessel.  Coronary Calcium Score: 355 Agatston units    In April 2013 was started on Tikosyn for AF. Feels very good. On apixaban. Very active without CP, SOB or dyspnea. Had serious muscle aches with statins now taking Crestor 2.5 on M/W/F.  Not using CPAP - has trouble keeping the mask on. BP well controlled at home.   Echo today EF 55-60% Mild MR (I reviewed images personally)   Lab Results  Component Value Date   CHOL 196 06/25/2011   HDL 91.70 06/25/2011   LDLCALC 92 06/25/2011   LDLDIRECT 141.6 11/12/2010   TRIG 61.0 06/25/2011   CHOLHDL 2 06/25/2011     ROS: All systems negative except as listed in HPI, PMH and Problem List.  Past Medical History  Diagnosis Date  . Sleep apnea   . Hyperlipidemia   . Hypothyroidism   . GERD (gastroesophageal reflux disease)   . Osteoarthritis   . Dyspnea     chronic  . Glucose intolerance (impaired glucose tolerance)   . Micturition syncope   . Hypertension   . Heart murmur     "slight"  . Atrial flutter     s/p ablation 07/17/08; echo EF 60-65% trivival AI  . Bradycardia   . H/O hiatal hernia   . Anxiety     Current Outpatient  Prescriptions  Medication Sig Dispense Refill  . amLODipine (NORVASC) 10 MG tablet Take 10 mg by mouth daily.       [name] apixaban (ELIQUIS) 5 MG TABS tablet Take 5 mg by mouth 2 (two) times daily.      . benazepril (LOTENSIN) 40 MG tablet Take 40 mg by mouth daily.       . cloNIDine (CATAPRES) 0.3 MG tablet Take 0.15 mg by mouth 2 (two) times daily.       . finasteride (PROSCAR) 5 MG tablet Take 5 mg by mouth daily.       . fish oil-omega-3 fatty acids 1000 MG capsule Take 2 g by mouth daily.       [name] levothyroxine (SYNTHROID) 137 MCG tablet Take 137 mcg by mouth daily.       . Multiple Vitamins-Minerals (CENTRUM SILVER PO) Take 0.5 tablets by mouth daily.       . naproxen (NAPROSYN) 250 MG tablet Take 1 tablet (250 mg total) by mouth 2 (two) times daily with a meal.  60 tablet  6  . omeprazole (PRILOSEC) 20 MG capsule Take 20 mg by mouth daily.       [name] PARoxetine (PAXIL) 40 MG tablet Take 40 mg by mouth every morning.       . rosuvastatin (CRESTOR) 5 MG tablet Take 2.5 mg by  mouth 3 (three) times a week. M,w,f       . dofetilide (TIKOSYN) 500 MCG capsule Take 1 capsule (500 mcg total) by mouth every 12 (twelve) hours.  180 capsule  3     PHYSICAL EXAM: Filed Vitals:   01/06/12 1005  BP: 156/72  Pulse: 57   General:  Well appearing. No resp difficulty HEENT: normal Neck: supple. JVP flat. Carotids 2+ bilaterally; + faint bilat bruits (likely radiated from AoV). No lymphadenopathy or thryomegaly appreciated. Cor: PMI normal. Ir. No rubs, gallops. Soft systolic murmur at RSB. S2 well preserved Lungs: clear Abdomen: soft, nontender, nondistended. No hepatosplenomegaly. No bruits or masses. Good bowel sounds. Extremities: no cyanosis, clubbing, rash, edema Neuro: alert & orientedx3, cranial nerves grossly intact. Moves all 4 extremities w/o difficulty. Affect pleasant.   ECG: Sinus brady No ST-T wave abnormalities.       ASSESSMENT & PLAN:

## 2012-01-06 NOTE — Patient Instructions (Signed)
Your physician has requested that you have a carotid duplex. This test is an ultrasound of the carotid arteries in your neck. It looks at blood flow through these arteries that supply the brain with blood. Allow one hour for this exam. There are no restrictions or special instructions.  [facility] 01/13/2012 @ 2:30  Your physician recommends that you return for lab work in: 1 week

## 2012-01-06 NOTE — Addendum Note (Signed)
Encounter addended by: Theresia Bough, CMA on: 01/06/2012 11:07 AM<BR>     Documentation filed: Patient Instructions Section, Orders

## 2012-01-06 NOTE — Assessment & Plan Note (Signed)
See above

## 2012-01-06 NOTE — Assessment & Plan Note (Signed)
Maintaining SR on Tikosyn. Check ECG. Continue apixaban.

## 2012-01-06 NOTE — Addendum Note (Signed)
Encounter addended by: Noralee Space, RN on: 01/06/2012 10:32 AM<BR>     Documentation filed: Orders

## 2012-01-06 NOTE — Assessment & Plan Note (Signed)
BP on the high end of normal. Need to watch closely. If SBP > 140 would increase anti-HTN regimen.

## 2012-01-06 NOTE — Assessment & Plan Note (Signed)
Likely radiated from AoV. Will check u/s to be sure.

## 2012-01-06 NOTE — Progress Notes (Signed)
  Echocardiogram 2D Echocardiogram has been performed.  [name] White 01/06/2012, 9:51 AM

## 2012-01-06 NOTE — Addendum Note (Signed)
Encounter addended by: Theresia Bough, CMA on: 01/06/2012 10:37 AM<BR>     Documentation filed: Orders

## 2012-01-14 ENCOUNTER — Other Ambulatory Visit: Payer: Self-pay | Admitting: Cardiology

## 2012-01-14 DIAGNOSIS — R0989 Other specified symptoms and signs involving the circulatory and respiratory systems: Secondary | ICD-10-CM

## 2012-01-20 ENCOUNTER — Encounter (INDEPENDENT_AMBULATORY_CARE_PROVIDER_SITE_OTHER): Payer: Medicare Other

## 2012-01-20 DIAGNOSIS — I6529 Occlusion and stenosis of unspecified carotid artery: Secondary | ICD-10-CM

## 2012-01-20 DIAGNOSIS — R0989 Other specified symptoms and signs involving the circulatory and respiratory systems: Secondary | ICD-10-CM

## 2012-01-30 ENCOUNTER — Telehealth: Payer: Self-pay | Admitting: Internal Medicine

## 2012-01-30 NOTE — Telephone Encounter (Signed)
Caller: [name]/Patient; Patient Name: [name] White; PCP: Darryll Capers (Adults only); Best Callback Phone Number: [phone];  Is complaining of both ears with fluid and can not hear well- right ear is the worse.  Has been like this for several days; Denies fever or cold symptoms; Irritated nose-but thinks is related to CPAP; Onset was 01/29/12; Triaged using [id number] with a disposition to see provider within 24 hours due to Constant or intermittent dull earache, throbbing in ear(s) or feeling of fullness; may interfer with sleep or ability to carry out normal activities.  Appointment made with office for Dr. Genevieve Norlander on 01/31/12 at 12:45.  Care advice given with patient demonstrating understanding.

## 2012-01-31 ENCOUNTER — Encounter: Payer: Self-pay | Admitting: Internal Medicine

## 2012-01-31 ENCOUNTER — Ambulatory Visit (INDEPENDENT_AMBULATORY_CARE_PROVIDER_SITE_OTHER): Payer: Medicare Other | Admitting: Internal Medicine

## 2012-01-31 VITALS — BP 130/84 | Temp 98.5°F | Wt 265.0 lb

## 2012-01-31 DIAGNOSIS — I10 Essential (primary) hypertension: Secondary | ICD-10-CM

## 2012-01-31 DIAGNOSIS — H6590 Unspecified nonsuppurative otitis media, unspecified ear: Secondary | ICD-10-CM

## 2012-01-31 NOTE — Patient Instructions (Signed)
    Use saline irrigation,  and over-the-counter decongestants only as directed.  Call if there is no improvement in 5 to 7 days, or sooner if you develop increasing pain, fever, or any new symptoms.  Nasonex use daily  Call or return to clinic prn if these symptoms worsen or fail to improve as anticipated.

## 2012-01-31 NOTE — Progress Notes (Signed)
Subjective:    Patient ID: [name] White, male    DOB: [birth year], 76 y.o.   MRN: [id number]  HPI  76 year old patient has a history of atrial for relation he also has a history of treated hypertension. He presents with a two-day history of congestion and fullness in the right ear. Denies any fever or purulent drainage. No headache. He is on Crestor for dyslipidemia.  Past Medical History  Diagnosis Date  . Sleep apnea   . Hyperlipidemia   . Hypothyroidism   . GERD (gastroesophageal reflux disease)   . Osteoarthritis   . Dyspnea     chronic  . Glucose intolerance (impaired glucose tolerance)   . Micturition syncope   . Hypertension   . Heart murmur     "slight"  . Atrial flutter     s/p ablation 07/17/08; echo EF 60-65% trivival AI  . Bradycardia   . H/O hiatal hernia   . Anxiety     History   Social History  . Marital Status: Married    Spouse Name: N/A    Number of Children: N/A  . Years of Education: N/A   Occupational History  . Not on file.   Social History Main Topics  . Smoking status: Former Smoker -- 0 years    Types: Pipe    Quit date: 04/01/1996  . Smokeless tobacco: Former Neurosurgeon    Types: Chew    Quit date: 04/01/2009  . Alcohol Use: 3.5 oz/week    7 drink(s) per week     08/12/11 "small glass of bourbon q night"  . Drug Use: No  . Sexually Active: Not Currently   Other Topics Concern  . Not on file   Social History Narrative  . No narrative on file    Past Surgical History  Procedure Date  . Tonsillectomy 1940's  . Total knee arthroplasty 1990's    right  . Cataract extraction w/ intraocular lens  implant, bilateral ~ 2007  . Cardiac electrophysiology mapping and ablation 06/2008    Family History  Problem Relation Age of Onset  . Multiple sclerosis Other   . Heart failure Other   . Hypertension Other     Allergies  Allergen Reactions  . Codeine Itching and Rash  . Statins Other (See Comments)    Made very sick, doctor thought had  heart attack; "my appearance, color, etc"    Current Outpatient Prescriptions on File Prior to Visit  Medication Sig Dispense Refill  . amLODipine (NORVASC) 10 MG tablet Take 10 mg by mouth daily.       [name] apixaban (ELIQUIS) 5 MG TABS tablet Take 5 mg by mouth 2 (two) times daily.      . benazepril (LOTENSIN) 40 MG tablet Take 40 mg by mouth daily.       . cloNIDine (CATAPRES) 0.3 MG tablet Take 0.15 mg by mouth 2 (two) times daily.       . finasteride (PROSCAR) 5 MG tablet Take 5 mg by mouth daily.       . fish oil-omega-3 fatty acids 1000 MG capsule Take 2 g by mouth daily.       [name] levothyroxine (SYNTHROID) 137 MCG tablet Take 137 mcg by mouth daily.       . Multiple Vitamins-Minerals (CENTRUM SILVER PO) Take 0.5 tablets by mouth daily.       . naproxen (NAPROSYN) 250 MG tablet Take 1 tablet (250 mg total) by mouth 2 (two) times daily with a meal.  60 tablet  6  . omeprazole (PRILOSEC) 20 MG capsule Take 20 mg by mouth daily.       [name] PARoxetine (PAXIL) 40 MG tablet Take 40 mg by mouth every morning.       . rosuvastatin (CRESTOR) 5 MG tablet Take 2.5 mg by mouth 3 (three) times a week. M,w,f       . DISCONTD: dofetilide (TIKOSYN) 500 MCG capsule Take 1 capsule (500 mcg total) by mouth every 12 (twelve) hours.  180 capsule  3    BP 130/84  Temp 98.5 F (36.9 C) (Oral)  Wt 265 lb (120.203 kg)       Review of Systems  Constitutional: Negative for fever, chills, appetite change and fatigue.  HENT: Positive for hearing loss and congestion. Negative for ear pain, sore throat, trouble swallowing, neck stiffness, dental problem, voice change and tinnitus.   Eyes: Negative for pain, discharge and visual disturbance.  Respiratory: Negative for cough, chest tightness, wheezing and stridor.   Cardiovascular: Negative for chest pain, palpitations and leg swelling.  Gastrointestinal: Negative for nausea, vomiting, abdominal pain, diarrhea, constipation, blood in stool and abdominal  distention.  Genitourinary: Negative for urgency, hematuria, flank pain, discharge, difficulty urinating and genital sores.  Musculoskeletal: Negative for myalgias, back pain, joint swelling, arthralgias and gait problem.  Skin: Negative for rash.  Neurological: Negative for dizziness, syncope, speech difficulty, weakness, numbness and headaches.  Hematological: Negative for adenopathy. Does not bruise/bleed easily.  Psychiatric/Behavioral: Negative for behavioral problems and dysphoric mood. The patient is not nervous/anxious.        Objective:   Physical Exam  Constitutional: He appears well-developed and well-nourished. No distress.  HENT:  Head: Normocephalic and atraumatic.  Right Ear: External ear normal.  Left Ear: External ear normal.  Nose: Nose normal.  Mouth/Throat: Oropharynx is clear and moist.       Weber does lateralize to the right Canals are normal and free of cerumen          Assessment & Plan:    Serous otitis media. Will place on a nasal steroid Sudafed and observe Hypertension stable

## 2012-02-26 ENCOUNTER — Other Ambulatory Visit: Payer: Self-pay | Admitting: Internal Medicine

## 2012-02-28 ENCOUNTER — Other Ambulatory Visit: Payer: Self-pay | Admitting: *Deleted

## 2012-02-28 MED ORDER — APIXABAN 5 MG PO TABS: 5.0000 mg | ORAL_TABLET | Freq: Two times a day (BID) | ORAL | Status: DC

## 2012-03-02 NOTE — Telephone Encounter (Signed)
Duplicate request

## 2012-04-11 ENCOUNTER — Other Ambulatory Visit: Payer: Self-pay

## 2012-04-11 ENCOUNTER — Emergency Department (HOSPITAL_COMMUNITY)
Admission: EM | Admit: 2012-04-11 | Discharge: 2012-04-11 | Disposition: A | Discharge status: Home / Self Care | Payer: Medicare Other | Attending: Emergency Medicine | Admitting: Emergency Medicine

## 2012-04-11 ENCOUNTER — Encounter (HOSPITAL_COMMUNITY): Payer: Self-pay | Admitting: *Deleted

## 2012-04-11 DIAGNOSIS — K219 Gastro-esophageal reflux disease without esophagitis: Secondary | ICD-10-CM | POA: Insufficient documentation

## 2012-04-11 DIAGNOSIS — F411 Generalized anxiety disorder: Secondary | ICD-10-CM | POA: Insufficient documentation

## 2012-04-11 DIAGNOSIS — R040 Epistaxis: Secondary | ICD-10-CM | POA: Insufficient documentation

## 2012-04-11 DIAGNOSIS — I499 Cardiac arrhythmia, unspecified: Secondary | ICD-10-CM | POA: Insufficient documentation

## 2012-04-11 DIAGNOSIS — I4892 Unspecified atrial flutter: Secondary | ICD-10-CM | POA: Insufficient documentation

## 2012-04-11 DIAGNOSIS — Z8639 Personal history of other endocrine, nutritional and metabolic disease: Secondary | ICD-10-CM | POA: Insufficient documentation

## 2012-04-11 DIAGNOSIS — Z8739 Personal history of other diseases of the musculoskeletal system and connective tissue: Secondary | ICD-10-CM | POA: Insufficient documentation

## 2012-04-11 DIAGNOSIS — R011 Cardiac murmur, unspecified: Secondary | ICD-10-CM | POA: Insufficient documentation

## 2012-04-11 DIAGNOSIS — Z79899 Other long term (current) drug therapy: Secondary | ICD-10-CM | POA: Insufficient documentation

## 2012-04-11 DIAGNOSIS — Z8669 Personal history of other diseases of the nervous system and sense organs: Secondary | ICD-10-CM | POA: Insufficient documentation

## 2012-04-11 DIAGNOSIS — E039 Hypothyroidism, unspecified: Secondary | ICD-10-CM | POA: Insufficient documentation

## 2012-04-11 DIAGNOSIS — Z9981 Dependence on supplemental oxygen: Secondary | ICD-10-CM | POA: Insufficient documentation

## 2012-04-11 DIAGNOSIS — Z8719 Personal history of other diseases of the digestive system: Secondary | ICD-10-CM | POA: Insufficient documentation

## 2012-04-11 DIAGNOSIS — Z7901 Long term (current) use of anticoagulants: Secondary | ICD-10-CM | POA: Insufficient documentation

## 2012-04-11 DIAGNOSIS — Z87891 Personal history of nicotine dependence: Secondary | ICD-10-CM | POA: Insufficient documentation

## 2012-04-11 DIAGNOSIS — Z862 Personal history of diseases of the blood and blood-forming organs and certain disorders involving the immune mechanism: Secondary | ICD-10-CM | POA: Insufficient documentation

## 2012-04-11 DIAGNOSIS — I10 Essential (primary) hypertension: Secondary | ICD-10-CM | POA: Insufficient documentation

## 2012-04-11 LAB — APTT: aPTT: 31 seconds (ref 24–37)

## 2012-04-11 LAB — CBC WITH DIFFERENTIAL/PLATELET
Basophils Absolute: 0 10*3/uL (ref 0.0–0.1)
Basophils Relative: 1 % (ref 0–1)
Lymphocytes Relative: 22 % (ref 12–46)
MCHC: 34.1 g/dL (ref 30.0–36.0)
Monocytes Absolute: 0.4 10*3/uL (ref 0.1–1.0)
Neutro Abs: 3.4 10*3/uL (ref 1.7–7.7)
Neutrophils Relative %: 68 % (ref 43–77)
Platelets: 161 10*3/uL (ref 150–400)
RDW: 14.2 % (ref 11.5–15.5)
WBC: 5 10*3/uL (ref 4.0–10.5)

## 2012-04-11 LAB — PROTIME-INR: INR: 1.05 (ref 0.00–1.49)

## 2012-04-11 MED ORDER — CLONIDINE HCL 0.2 MG PO TABS
0.3000 mg | ORAL_TABLET | Freq: Once | ORAL | Status: AC
  Administered 2012-04-11: 0.3 mg via ORAL
  Filled 2012-04-11: qty 1

## 2012-04-11 MED ORDER — AMLODIPINE BESYLATE 10 MG PO TABS
10.0000 mg | ORAL_TABLET | Freq: Once | ORAL | Status: AC
  Administered 2012-04-11: 10 mg via ORAL
  Filled 2012-04-11: qty 1

## 2012-04-11 NOTE — ED Notes (Signed)
Pt states bleeding lasted 10 min, denied injury. Concerned & decided to come to ED when EMS noted HR irregular & elevated BP. Pt has not taken any of his daily meds today which he normally takes at 0630. Denies any CP, SOB, dizziness.  Pt presently NSR on monitor without ectopy & no active bleeding to left nare

## 2012-04-11 NOTE — ED Notes (Signed)
C/o epistaxis left nare lasting 15 minutes which resolved prior to EMS arrival. Pt noted to be hypertensive & A fib with HR 90-120/min with no complaints. Denies CP, SOB

## 2012-04-11 NOTE — ED Provider Notes (Signed)
History     CSN: [id number]  Arrival date & time 04/11/12  0820   First MD Initiated Contact with Patient 04/11/12 0825      Chief Complaint  Patient presents with  . Epistaxis  . Irregular Heart Beat    (Consider location/radiation/quality/duration/timing/severity/associated sxs/prior treatment) HPI  Patient reports this morning he was in the bathroom brushing his teeth and all of a sudden he had acute onset of left nosebleed with postnasal bleeding. He states his nose bled for about 10 minutes. He states it went away when he held pressure to his nose. He reports when EMS got there his bleeding had stopped however he was noted to have a elevated blood pressure it EMS noted a irregular heartbeat. Patient has a history of atrophic relation so he was concerned about the irregular heartbeat. He states he has not had his morning medications yet including his blood pressure medicines. He states he's never had a nosebleed before. Patient states he uses a CPAP machine at home that has nasal prongs but he uses humidified oxygen or air. He denies rhinorrhea, sneezing or coughing, he also denies no chest pain or shortness of breath. Patient is on eliquis for blood thinner.    Patient reports he's been under a lot of stress the last few days. He states he has an elderly dog that is not doing well and  he has been kept up at night the last couple nights. They also saw the vet twice yesterday and they were to take the dog in this morning to have the dog put to sleep.   PCP Dr. Darryll Capers Cardiologist Dr. Jones Broom and Dr. Ladona Ridgel of [facility]  Past Medical History  Diagnosis Date  . Sleep apnea   . Hyperlipidemia   . Hypothyroidism   . GERD (gastroesophageal reflux disease)   . Osteoarthritis   . Dyspnea     chronic  . Glucose intolerance (impaired glucose tolerance)   . Micturition syncope   . Hypertension   . Heart murmur     "slight"  . Atrial flutter     s/p ablation 07/17/08; echo EF  60-65% trivival AI  . Bradycardia   . H/O hiatal hernia   . Anxiety     Past Surgical History  Procedure Date  . Tonsillectomy 1940's  . Total knee arthroplasty 1990's    right  . Cataract extraction w/ intraocular lens  implant, bilateral ~ 2007  . Cardiac electrophysiology mapping and ablation 06/2008    Family History  Problem Relation Age of Onset  . Multiple sclerosis Other   . Heart failure Other   . Hypertension Other     History  Substance Use Topics  . Smoking status: Former Smoker -- 0 years    Types: Pipe    Quit date: 04/01/1996  . Smokeless tobacco: Former Neurosurgeon    Types: Chew    Quit date: 04/01/2009  . Alcohol Use: 3.5 oz/week    7 drink(s) per week     Comment: 08/12/11 "small glass of bourbon q night"   Lives at home Lives with spouse   Review of Systems  All other systems reviewed and are negative.    Allergies  Codeine and Statins  Home Medications   Current Outpatient Rx  Name  Route  Sig  Dispense  Refill  . AMLODIPINE BESYLATE 10 MG PO TABS   Oral   Take 10 mg by mouth daily.          [name]  APIXABAN 5 MG PO TABS   Oral   Take 1 tablet (5 mg total) by mouth 2 (two) times daily.   60 tablet   6   . BENAZEPRIL HCL 40 MG PO TABS   Oral   Take 40 mg by mouth daily.          [name] CLONIDINE HCL 0.3 MG PO TABS   Oral   Take 0.15 mg by mouth 2 (two) times daily.          . DOFETILIDE 500 MCG PO CAPS   Oral   Take 500 mcg by mouth every 12 (twelve) hours.         [name] FINASTERIDE 5 MG PO TABS   Oral   Take 5 mg by mouth daily.          . OMEGA-3 FATTY ACIDS 1000 MG PO CAPS   Oral   Take 2 g by mouth daily.          [name] LEVOTHYROXINE SODIUM 137 MCG PO TABS   Oral   Take 137 mcg by mouth daily.          . CENTRUM SILVER PO   Oral   Take 0.5 tablets by mouth daily.          [name] OMEPRAZOLE 20 MG PO CPDR   Oral   Take 20 mg by mouth daily.          [name] PAROXETINE HCL 40 MG PO TABS   Oral   Take 40 mg by mouth every  morning.            ED Triage Vitals  Enc Vitals Group     BP 04/11/12 0826 194/94 mmHg     Pulse Rate 04/11/12 0826 82      Resp 04/11/12 0826 18      Temp 04/11/12 0826 98.2 F (36.8 C)     Temp src 04/11/12 0826 Oral     SpO2 04/11/12 0826 97 %     Weight --      Height --      Head Cir --      Peak Flow --      Pain Score --      Pain Loc --      Pain Edu? --      Excl. in GC? --    Vital signs normal except hypertension   Physical Exam  Nursing note and vitals reviewed. Constitutional: He is oriented to person, place, and time. He appears well-developed and well-nourished.  Non-toxic appearance. He does not appear ill. No distress.  HENT:  Head: Normocephalic and atraumatic.  Right Ear: External ear normal.  Left Ear: External ear normal.  Nose: Nose normal. No mucosal edema or rhinorrhea.  Mouth/Throat: Oropharynx is clear and moist and mucous membranes are normal. No dental abscesses or uvula swelling.       No active bleeding and the nostrils. There is no obvious site of bleeding on the left nasal septum. There is some blood seen in the posterior nares that appears old, the right side also appears normal  Eyes: Conjunctivae normal and EOM are normal. Pupils are equal, round, and reactive to light.  Neck: Normal range of motion and full passive range of motion without pain. Neck supple.  Cardiovascular: Normal rate, regular rhythm and normal heart sounds.  Exam reveals no gallop and no friction rub.   No murmur heard.      Patient noted to have mild  irregularity when I listen to his heart however when I watched the monitor he is having rare PAC or PVC otherwise he is in normal sinus rhythm  Pulmonary/Chest: Effort normal and breath sounds normal. No respiratory distress. He has no wheezes. He has no rhonchi. He has no rales. He exhibits no tenderness and no crepitus.  Abdominal: Soft. Normal appearance and bowel sounds are normal. He exhibits no distension. There is  no tenderness. There is no rebound and no guarding.  Musculoskeletal: Normal range of motion. He exhibits no edema and no tenderness.       Moves all extremities well.   Neurological: He is alert and oriented to person, place, and time. He has normal strength. No cranial nerve deficit.  Skin: Skin is warm, dry and intact. No rash noted. No erythema. No pallor.  Psychiatric: He has a normal mood and affect. His speech is normal and behavior is normal. His mood appears not anxious.    ED Course  Procedures (including critical care time)   Medications  amLODipine (NORVASC) tablet 10 mg (10 mg Oral Given 04/11/12 0924)  cloNIDine (CATAPRES) tablet 0.3 mg (0.3 mg Oral Given 04/11/12 0924)   Pt given his usual morning home BP medications. Patient is in normal sinus rhythm on his monitor with rare PAC or PVC  11:30 BP 130/83, no more epistaxis, feels fine.   Results for orders placed during the hospital encounter of 04/11/12  CBC WITH DIFFERENTIAL      Component Value Range   WBC 5.0  4.0 - 10.5 K/uL   RBC 4.91  4.22 - 5.81 MIL/uL   Hemoglobin 14.5  13.0 - 17.0 g/dL   HCT 46.9  62.9 - 52.8 %   MCV 86.6  78.0 - 100.0 fL   MCH 29.5  26.0 - 34.0 pg   MCHC 34.1  30.0 - 36.0 g/dL   RDW 41.3  24.4 - 01.0 %   Platelets 161  150 - 400 K/uL   Neutrophils Relative 68  43 - 77 %   Neutro Abs 3.4  1.7 - 7.7 K/uL   Lymphocytes Relative 22  12 - 46 %   Lymphs Abs 1.1  0.7 - 4.0 K/uL   Monocytes Relative 7  3 - 12 %   Monocytes Absolute 0.4  0.1 - 1.0 K/uL   Eosinophils Relative 2  0 - 5 %   Eosinophils Absolute 0.1  0.0 - 0.7 K/uL   Basophils Relative 1  0 - 1 %   Basophils Absolute 0.0  0.0 - 0.1 K/uL  APTT      Component Value Range   aPTT 31  24 - 37 seconds  PROTIME-INR      Component Value Range   Prothrombin Time 13.6  11.6 - 15.2 seconds   INR 1.05  0.00 - 1.49   Laboratory interpretation all normal except     Date: 04/11/2012  Rate: 78  Rhythm: normal sinus rhythm  QRS Axis:  normal  Intervals: normal  ST/T Wave abnormalities: normal  Conduction Disutrbances:left anterior fascicular block and LVH  Narrative Interpretation:   Old EKG Reviewed: unchanged from 01/06/2012     1. Hypertension   2. Epistaxis     Plan discharge  Devoria Albe, MD, FACEP   MDM          Ward Givens, MD 04/11/12 [phone]

## 2012-04-13 ENCOUNTER — Telehealth (HOSPITAL_COMMUNITY): Payer: Self-pay | Admitting: Cardiology

## 2012-04-13 NOTE — Telephone Encounter (Signed)
Ask him get a humidifier for his room as the air is dry right now.  If bleeding does not improve than he should let us know.    Thanks.

## 2012-04-13 NOTE — Telephone Encounter (Signed)
Pt called and states he is having problems with his Eliquis. Pt states he has been having frequent nose bleeds x 2-3 days. Nose bleed are quite heavy since he uses a CPAP machine.  Please advise

## 2012-04-14 ENCOUNTER — Ambulatory Visit (INDEPENDENT_AMBULATORY_CARE_PROVIDER_SITE_OTHER): Payer: Medicare Other | Admitting: Family

## 2012-04-14 ENCOUNTER — Encounter: Payer: Self-pay | Admitting: Family

## 2012-04-14 ENCOUNTER — Telehealth: Payer: Self-pay | Admitting: Internal Medicine

## 2012-04-14 VITALS — BP 144/88 | HR 68 | Wt 262.0 lb

## 2012-04-14 DIAGNOSIS — R040 Epistaxis: Secondary | ICD-10-CM

## 2012-04-14 DIAGNOSIS — Z79899 Other long term (current) drug therapy: Secondary | ICD-10-CM

## 2012-04-14 NOTE — Telephone Encounter (Signed)
Patient Information:  Caller Name: Jamonta  Phone: [phone]  Patient: [name], White  Gender: Male  DOB: 13-May-1935  Age: 77 Years  PCP: Darryll Capers (Adults only)  Office Follow Up:  Does the office need to follow up with this patient?: No  Instructions For The Office: N/A  RN Note:  Nosebleed occurs only from Left nostril. On blood thinner, Eliquis.  Lab work done in  [facility] ED.  ED suggeted recurrent nosebleeds from dryness of nares related to CPAP. Uses humidifier with CPAP and has 49% humidy in home at present.  Last nose bleed, left nostril at 0200; bleeding stopped within 10 minutes with nasal pressure.  Feels there is a large clot in Left nostril. Unable to reach cardiologist office who left message advising get a humidifier and call if bleeding recurs.  Symptoms  Reason For Call & Symptoms: Multiple recurrent nosebleed with 3 X/day.  Seen in ED 04/11/12.  Reviewed Health History In EMR: Yes  Reviewed Medications In EMR: Yes  Reviewed Allergies In EMR: Yes  Reviewed Surgeries / Procedures: Yes  Date of Onset of Symptoms: 04/11/2012  Treatments Tried: Pressure applied to nose  Treatments Tried Worked: Yes  Guideline(s) Used:  Nosebleed  Disposition Per Guideline:   See Today in Office  Reason For Disposition Reached:   Bleeding recurs 3 or more times in 24 hours despite direct pressure  Advice Given:  Reassurance:  Nosebleeds are common.  You should be able to stop the bleeding if you use the correct technique.  Remember to Sit up and lean forward to keep the blood from running down the back of your throat.  Treating a Nosebleed - Pinch the Nostrils:  First blow the nose to clear out any large clots.  Sit up and lean forward (Reason: blood makes people choke if they lean backwards).  Gently squeeze the soft parts of the lower nose (nostrils) together. Use your thumb and your index finger in a pinching manner. Do this for 15 minutes. Use a clock or watch to  measure the time. Your goal is to apply continuous pressure to the bleeding point.  If the bleeding does not stop after 15 minutes of squeezing, move your point of pressure and do this again for another 15 minutes.  Treating a Nosebleed - Inserting a Gauze with Decongestant Nose Drops:  If applying pressure fails, insert a gauze wet with decongestant nose drops (or petroleum jelly). (Reason: the gauze helps to apply pressure and the nose drops shrink the blood vessels)  Example decongestant medication: Afrin (oxymetazoline) nasal spray is available over-the-counter and is a nasal decongestant.  Caution - Nasal Decongestants:  Do not take these medications if you have high blood pressure, heart disease, prostate problems, or an overactive thyroid.  Prevention:  Dry air in your house or workplace can increase the chance of nosebleeds occurring. If the air is dry, use a humidifier in your bedroom to keep the nose from drying out. You can also apply petroleum jelly to the center wall (septum) inside the nose twice daily to reduce cracking and to promote healing.  Expected Course:  After swallowing blood from a nosebleed, you may feel nauseated because the blood can irritate your stomach. You may also later pass a dark stool that contains the blood.  Call Back If:  Nosebleed lasts longer than 30 minutes with using direct pressure  Lightheadedness or weakness occurs  Nosebleeds become worse  You become worse.  Appointment Scheduled:  04/14/2012  14:30:00 Appointment Scheduled Provider:  Adline Mango [facility])

## 2012-04-14 NOTE — Patient Instructions (Addendum)
1. Per Dr. Milas Kocher, hold Eliquis x 3 days. Resume Friday evening.  2. Starting tomorrow, saline spray to the left nostril 3 times a day and as needed to keep mucous membrane moisturized.    Nosebleed Nosebleeds can be caused by many conditions including trauma, infections, polyps, foreign bodies, dry mucous membranes or climate, medications and air conditioning. Most nosebleeds occur in the front of the nose. It is because of this location that most nosebleeds can be controlled by pinching the nostrils gently and continuously. Do this for at least 10 to 20 minutes. The reason for this long continuous pressure is that you must hold it long enough for the blood to clot. If during that 10 to 20 minute time period, pressure is released, the process may have to be started again. The nosebleed may stop by itself, quit with pressure, need concentrated heating (cautery) or stop with pressure from packing. HOME CARE INSTRUCTIONS   If your nose was packed, try to maintain the pack inside until your caregiver removes it. If a gauze pack was used and it starts to fall out, gently replace or cut the end off. Do not cut if a balloon catheter was used to pack the nose. Otherwise, do not remove unless instructed.  Avoid blowing your nose for 12 hours after treatment. This could dislodge the pack or clot and start bleeding again.  If the bleeding starts again, sit up and bending forward, gently pinch the front half of your nose continuously for 20 minutes.  If bleeding was caused by dry mucous membranes, cover the inside of your nose every morning with a petroleum or antibiotic ointment. Use your little fingertip as an applicator. Do this as needed during dry weather. This will keep the mucous membranes moist and allow them to heal.  Maintain humidity in your home by using less air conditioning or using a humidifier.  Do not use aspirin or medications which make bleeding more likely. Your caregiver can give you  recommendations on this.  Resume normal activities as able but try to avoid straining, lifting or bending at the waist for several days.  If the nosebleeds become recurrent and the cause is unknown, your caregiver may suggest laboratory tests. SEEK IMMEDIATE MEDICAL CARE IF:   Bleeding recurs and cannot be controlled.  There is unusual bleeding from or bruising on other parts of the body.  You have a fever.  Nosebleeds continue.  There is any worsening of the condition which originally brought you in.  You become lightheaded, feel faint, become sweaty or vomit blood. MAKE SURE YOU:   Understand these instructions.  Will watch your condition.  Will get help right away if you are not doing well or get worse. Document Released: 12/26/2004 Document Revised: 06/10/2011 Document Reviewed: 02/17/2009 [facility] Patient Information 2013 [place], [place].

## 2012-04-14 NOTE — Progress Notes (Signed)
Subjective:    Patient ID: [name] White, male    DOB: 09/28/1935, 77 y.o.   MRN: [id number]  HPI 77 year old white male, nonsmoker, patient of Dr. Lovell Sheehan is in today with persistent nosebleeds. Nosebleed originally started Saturday, 04/12/2011. He was seen in the emergency department and advised compression and ice. He was no better. Therefore, he call cardiology to get further instructions. Unfortunately, at this point, patient did not get the instructions to stop Eliquis for a few days per Dr. Philmore Pali until this OV. He took the medication this morning. So, during this time he's continues to take a look with. Nosebleed today is better. He has not had any bleeding since about 1 AM. However, he has not been able to sleep. He uses CPAP nightly, nasally, that makes it worse. The bleeding is from the left near.   Review of Systems  Constitutional: Negative.   Respiratory: Negative.   Cardiovascular: Negative.   Skin: Negative.   Neurological: Negative.   Hematological: Negative.   Psychiatric/Behavioral: Negative.    Past Medical History  Diagnosis Date  . Sleep apnea   . Hyperlipidemia   . Hypothyroidism   . GERD (gastroesophageal reflux disease)   . Osteoarthritis   . Dyspnea     chronic  . Glucose intolerance (impaired glucose tolerance)   . Micturition syncope   . Hypertension   . Heart murmur     "slight"  . Atrial flutter     s/p ablation 07/17/08; echo EF 60-65% trivival AI  . Bradycardia   . H/O hiatal hernia   . Anxiety     History   Social History  . Marital Status: Married    Spouse Name: N/A    Number of Children: N/A  . Years of Education: N/A   Occupational History  . Not on file.   Social History Main Topics  . Smoking status: Former Smoker -- 0 years    Types: Pipe    Quit date: 04/01/1996  . Smokeless tobacco: Former Neurosurgeon    Types: Chew    Quit date: 04/01/2009  . Alcohol Use: 3.5 oz/week    7 drink(s) per week     Comment: 08/12/11 "small glass  of bourbon q night"  . Drug Use: No  . Sexually Active: Not Currently   Other Topics Concern  . Not on file   Social History Narrative  . No narrative on file    Past Surgical History  Procedure Date  . Tonsillectomy 1940's  . Total knee arthroplasty 1990's    right  . Cataract extraction w/ intraocular lens  implant, bilateral ~ 2007  . Cardiac electrophysiology mapping and ablation 06/2008    Family History  Problem Relation Age of Onset  . Multiple sclerosis Other   . Heart failure Other   . Hypertension Other     Allergies  Allergen Reactions  . Codeine Itching and Rash  . Statins Other (See Comments)    Made very sick, doctor thought had heart attack; "my appearance, color, etc"    Current Outpatient Prescriptions on File Prior to Visit  Medication Sig Dispense Refill  . amLODipine (NORVASC) 10 MG tablet Take 10 mg by mouth daily.       [name] apixaban (ELIQUIS) 5 MG TABS tablet Take 1 tablet (5 mg total) by mouth 2 (two) times daily.  60 tablet  6  . benazepril (LOTENSIN) 40 MG tablet Take 40 mg by mouth daily.       . cloNIDine (  CATAPRES) 0.3 MG tablet Take 0.15 mg by mouth 2 (two) times daily.       [name] dofetilide (TIKOSYN) 500 MCG capsule Take 500 mcg by mouth every 12 (twelve) hours.      . finasteride (PROSCAR) 5 MG tablet Take 5 mg by mouth daily.       . fish oil-omega-3 fatty acids 1000 MG capsule Take 2 g by mouth daily.       [name] levothyroxine (SYNTHROID) 137 MCG tablet Take 137 mcg by mouth daily.       . Multiple Vitamins-Minerals (CENTRUM SILVER PO) Take 0.5 tablets by mouth daily.       [name] omeprazole (PRILOSEC) 20 MG capsule Take 20 mg by mouth daily.       [name] PARoxetine (PAXIL) 40 MG tablet Take 40 mg by mouth every morning.         BP 144/88  Pulse 68  Wt 262 lb (118.842 kg)  SpO2 98%chart    Objective:   Physical Exam  Constitutional: He is oriented to person, place, and time. He appears well-developed and well-nourished.  HENT:  Right Ear:  External ear normal.  Left Ear: External ear normal.  Mouth/Throat: Oropharynx is clear and moist.       Moderate clot to the anterior left near. No active bleeding  Neck: Normal range of motion. Neck supple.  Cardiovascular: Normal rate, regular rhythm and normal heart sounds.   Pulmonary/Chest: Effort normal and breath sounds normal.  Neurological: He is alert and oriented to person, place, and time.  Skin: Skin is warm and dry.  Psychiatric: He has a normal mood and affect.          Assessment & Plan:  Assessment: Epistaxis, High-risk medication usage  Plan: As directed by Dr. Philmore Pali, stop Eliquis x3 days. Patient will resume tablet was on Friday evening. Advised nasal saline to wash dry the mucous membranes as needed. At this point, he has no active bleeding. Therefore, I don't believe he needs an ENT consult. However, patient has been advised call the office if the nosebleeds continue and I will call to get him an appointment with ear nose and throat doctor.

## 2012-04-14 NOTE — Telephone Encounter (Signed)
Can be a side effect of eliquis in setting dry air.  Have discussed with Dr. Gala Romney and ok to hold eliquis for a couple of days and refer to ENT for cauterization.  Thanks.

## 2012-04-14 NOTE — Telephone Encounter (Signed)
Pt states he has tried this. Would like to know if this is a side effect from the eliquis? If you are satisfied that the nose bleeds are not a side effect pt will follow up with his primary doctor and possibly get a referral to ENT for cauterization.  States the nose bleeds happen every 2-3 hours and he is unable to get any sleep

## 2012-04-16 NOTE — Telephone Encounter (Signed)
This was addressed with pt during primary doctor appt.

## 2012-04-29 ENCOUNTER — Encounter: Payer: Self-pay | Admitting: Pulmonary Disease

## 2012-04-29 ENCOUNTER — Encounter: Payer: Self-pay | Admitting: Internal Medicine

## 2012-04-29 ENCOUNTER — Encounter: Payer: Self-pay | Admitting: Gastroenterology

## 2012-04-30 ENCOUNTER — Encounter: Payer: Self-pay | Admitting: Gastroenterology

## 2012-06-02 ENCOUNTER — Ambulatory Visit: Payer: Medicare Other | Admitting: Family

## 2012-06-03 ENCOUNTER — Encounter: Payer: Self-pay | Admitting: Family

## 2012-06-03 ENCOUNTER — Ambulatory Visit (INDEPENDENT_AMBULATORY_CARE_PROVIDER_SITE_OTHER): Payer: Medicare Other | Admitting: Family

## 2012-06-03 VITALS — BP 124/70 | HR 57 | Temp 97.9°F | Wt 268.0 lb

## 2012-06-03 DIAGNOSIS — H6981 Other specified disorders of Eustachian tube, right ear: Secondary | ICD-10-CM

## 2012-06-03 DIAGNOSIS — H8110 Benign paroxysmal vertigo, unspecified ear: Secondary | ICD-10-CM

## 2012-06-03 MED ORDER — FLUTICASONE PROPIONATE 50 MCG/ACT NA SUSP: 2.0000 | Freq: Every day | NASAL | Status: DC

## 2012-06-03 NOTE — Progress Notes (Signed)
Subjective:    Patient ID: [name] White, male    DOB: 07/02/1935, 77 y.o.   MRN: [id number]  HPI 77 year old white male, nonsmoker, patient of Dr. Lovell Sheehan is in today with complaints of vertigo and right ear congestion x2 days. He he also reports nasal congestion and a deviated septum that's been ongoing. He denies any sneezing or coughing. Has not taken any medication over-the-counter. Reports similar symptoms 2 months ago but does not remember what Dr. Lovell Sheehan gave him for relief. Has not tried anything over-the-counter at this point.   Review of Systems  Constitutional: Negative.   HENT: Negative.   Eyes: Negative.   Respiratory: Negative.   Cardiovascular: Negative.  Negative for chest pain and palpitations.  Gastrointestinal: Negative.   Musculoskeletal: Negative.   Skin: Negative.   Neurological: Positive for dizziness and light-headedness.  Hematological: Negative.   Psychiatric/Behavioral: Negative.    Past Medical History  Diagnosis Date  . Sleep apnea   . Hyperlipidemia   . Hypothyroidism   . GERD (gastroesophageal reflux disease)   . Osteoarthritis   . Dyspnea     chronic  . Glucose intolerance (impaired glucose tolerance)   . Micturition syncope   . Hypertension   . Heart murmur     "slight"  . Atrial flutter     s/p ablation 07/17/08; echo EF 60-65% trivival AI  . Bradycardia   . H/O hiatal hernia   . Anxiety     History   Social History  . Marital Status: Married    Spouse Name: N/A    Number of Children: N/A  . Years of Education: N/A   Occupational History  . Not on file.   Social History Main Topics  . Smoking status: Former Smoker -- 0 years    Types: Pipe    Quit date: 04/01/1996  . Smokeless tobacco: Former Neurosurgeon    Types: Chew    Quit date: 04/01/2009  . Alcohol Use: 3.5 oz/week    7 drink(s) per week     Comment: 08/12/11 "small glass of bourbon q night"  . Drug Use: No  . Sexually Active: Not Currently   Other Topics Concern  .  Not on file   Social History Narrative  . No narrative on file    Past Surgical History  Procedure Laterality Date  . Tonsillectomy  1940's  . Total knee arthroplasty  1990's    right  . Cataract extraction w/ intraocular lens  implant, bilateral  ~ 2007  . Cardiac electrophysiology mapping and ablation  06/2008    Family History  Problem Relation Age of Onset  . Multiple sclerosis Other   . Heart failure Other   . Hypertension Other     Allergies  Allergen Reactions  . Codeine Itching and Rash  . Statins Other (See Comments)    Made very sick, doctor thought had heart attack; "my appearance, color, etc"    Current Outpatient Prescriptions on File Prior to Visit  Medication Sig Dispense Refill  . amLODipine (NORVASC) 10 MG tablet Take 10 mg by mouth daily.       [name] apixaban (ELIQUIS) 5 MG TABS tablet Take 1 tablet (5 mg total) by mouth 2 (two) times daily.  60 tablet  6  . benazepril (LOTENSIN) 40 MG tablet Take 40 mg by mouth daily.       . cloNIDine (CATAPRES) 0.3 MG tablet Take 0.15 mg by mouth 2 (two) times daily.       [name]  dofetilide (TIKOSYN) 500 MCG capsule Take 500 mcg by mouth every 12 (twelve) hours.      . finasteride (PROSCAR) 5 MG tablet Take 5 mg by mouth daily.       . fish oil-omega-3 fatty acids 1000 MG capsule Take 2 g by mouth daily.       [name] levothyroxine (SYNTHROID) 137 MCG tablet Take 137 mcg by mouth daily.       . Multiple Vitamins-Minerals (CENTRUM SILVER PO) Take 0.5 tablets by mouth daily.       [name] omeprazole (PRILOSEC) 20 MG capsule Take 20 mg by mouth daily.       [name] PARoxetine (PAXIL) 40 MG tablet Take 40 mg by mouth every morning.        No current facility-administered medications on file prior to visit.    BP 124/70  Pulse 57  Temp(Src) 97.9 F (36.6 C) (Oral)  Wt 268 lb (121.564 kg)  BMI 33.5 kg/m2  SpO2 97%chart    Objective:   Physical Exam  Constitutional: He is oriented to person, place, and time. He appears well-developed and  well-nourished.  HENT:  Right Ear: External ear normal.  Left Ear: External ear normal.  Nose: Nose normal.  Mouth/Throat: Oropharynx is clear and moist.  Neck: Normal range of motion. Neck supple. No thyromegaly present.  Cardiovascular: Normal rate, regular rhythm and normal heart sounds.   Pulmonary/Chest: Effort normal and breath sounds normal.  Abdominal: Soft. Bowel sounds are normal.  Musculoskeletal: Normal range of motion.  Neurological: He is alert and oriented to person, place, and time.  Skin: Skin is warm and dry.  Psychiatric: He has a normal mood and affect.          Assessment & Plan:  Assessment:  1. Eustachian tube dysfunction 2. Vertigo  Plan: Over-the-counter Claritin or Zyrtec once daily. Flonase 2 sprays in each nostril once a day. Patient to call the office if symptoms worsen or persist. Recheck as scheduled, and as needed.

## 2012-06-04 ENCOUNTER — Other Ambulatory Visit: Payer: Self-pay

## 2012-06-04 MED ORDER — FLUTICASONE PROPIONATE 50 MCG/ACT NA SUSP: 2.0000 | Freq: Every day | NASAL | Status: DC

## 2012-08-03 ENCOUNTER — Ambulatory Visit (HOSPITAL_COMMUNITY)
Admission: RE | Admit: 2012-08-03 | Discharge: 2012-08-03 | Disposition: A | Discharge status: Home / Self Care | Payer: Medicare Other | Source: Ambulatory Visit | Attending: Internal Medicine | Admitting: Internal Medicine

## 2012-08-03 ENCOUNTER — Encounter (HOSPITAL_COMMUNITY): Payer: Self-pay

## 2012-08-03 VITALS — BP 148/94 | HR 68 | Resp 20 | Wt 267.0 lb

## 2012-08-03 DIAGNOSIS — R0609 Other forms of dyspnea: Secondary | ICD-10-CM | POA: Insufficient documentation

## 2012-08-03 DIAGNOSIS — I4891 Unspecified atrial fibrillation: Secondary | ICD-10-CM

## 2012-08-03 DIAGNOSIS — I2510 Atherosclerotic heart disease of native coronary artery without angina pectoris: Secondary | ICD-10-CM

## 2012-08-03 DIAGNOSIS — R0989 Other specified symptoms and signs involving the circulatory and respiratory systems: Secondary | ICD-10-CM | POA: Insufficient documentation

## 2012-08-03 DIAGNOSIS — I10 Essential (primary) hypertension: Secondary | ICD-10-CM | POA: Insufficient documentation

## 2012-08-03 NOTE — Assessment & Plan Note (Signed)
No ischemic symptoms.  He is tolerating low dose crestor, will continue.  Has follow up FLP with Dr. Lovell Sheehan.

## 2012-08-03 NOTE — Assessment & Plan Note (Signed)
Remains in NSR on tikosyn.  Continue apixaban.

## 2012-08-03 NOTE — Progress Notes (Signed)
PCP: Darryll Capers Also sees the [place] in [place]  HPI:  [name] White is a delightful 77 year old male with a history of dyspnea with a normal Myoview in April 2007.  He also has a history of micturition syncope, hypertension, hyperlipidemia,borderline diabetes, obesity, sleep apnea. Has a. flutter and underwent a. flutter ablation by Dr. Ladona Ridgel.  Cardiac CT in 07/2011 Left Main: No plaque or stenosis.  Left Anterior Descending: Prominent mixed plaque in the proximal  LAD with probably moderate (50-70%) stenosis. The first diagonal  was a small vessel with calcified plaque at the ostium (could be  significant stenosis but cannot say for sure due to blooming  artifact from the calcium).  Left Circumflex: Mixed plaque in the proximal LCx with mild  stenosis.  Right Coronary Artery: Mixed plaque in the mid RCA with mild  stenosis. Dominant vessel.  Coronary Calcium Score: 355 Agatston units   He returns for follow up today.  He feels well.  He denies CP, dyspnea or edema.  No bleeding.  He is not measuring BP at home.  No further muscle aches with current regimen.  He is going to the gym everyday.    Dr. Lovell Sheehan will follow up on lipids.     Lab Results  Component Value Date   CHOL 196 06/25/2011   HDL 91.70 06/25/2011   LDLCALC 92 06/25/2011   LDLDIRECT 141.6 11/12/2010   TRIG 61.0 06/25/2011   CHOLHDL 2 06/25/2011     ROS: All systems negative except as listed in HPI, PMH and Problem List.  Past Medical History  Diagnosis Date  . Sleep apnea   . Hyperlipidemia   . Hypothyroidism   . GERD (gastroesophageal reflux disease)   . Osteoarthritis   . Dyspnea     chronic  . Glucose intolerance (impaired glucose tolerance)   . Micturition syncope   . Hypertension   . Heart murmur     "slight"  . Atrial flutter     s/p ablation 07/17/08; echo EF 60-65% trivival AI  . Bradycardia   . H/O hiatal hernia   . Anxiety     Current Outpatient Prescriptions  Medication Sig Dispense Refill  .  amLODipine (NORVASC) 10 MG tablet Take 10 mg by mouth daily.       [name] apixaban (ELIQUIS) 5 MG TABS tablet Take 1 tablet (5 mg total) by mouth 2 (two) times daily.  60 tablet  6  . benazepril (LOTENSIN) 40 MG tablet Take 40 mg by mouth daily.       . cloNIDine (CATAPRES) 0.3 MG tablet Take 0.15 mg by mouth 2 (two) times daily.       [name] dofetilide (TIKOSYN) 500 MCG capsule Take 500 mcg by mouth every 12 (twelve) hours.      . finasteride (PROSCAR) 5 MG tablet Take 5 mg by mouth daily.       . fish oil-omega-3 fatty acids 1000 MG capsule Take 2 g by mouth daily.       . fluticasone (FLONASE) 50 MCG/ACT nasal spray Place 2 sprays into the nose daily.  16 g  6  . levothyroxine (SYNTHROID) 137 MCG tablet Take 137 mcg by mouth daily.       . Multiple Vitamins-Minerals (CENTRUM SILVER PO) Take 0.5 tablets by mouth daily.       [name] omeprazole (PRILOSEC) 20 MG capsule Take 20 mg by mouth daily.       [name] PARoxetine (PAXIL) 40 MG tablet Take 40 mg by mouth every morning.       [name]  rosuvastatin (CRESTOR) 5 MG tablet Take 2.5 mg by mouth daily. Taking 2.5 mg M and F and 5 mg on W       No current facility-administered medications for this encounter.     PHYSICAL EXAM: Filed Vitals:   08/03/12 1352  BP: 148/94  Pulse: 68  Resp: 20   General:  Well appearing. No resp difficulty HEENT: normal Neck: supple. JVP flat. Carotids 2+ bilaterally; + faint bilat bruits (likely radiated from AoV). No lymphadenopathy or thryomegaly appreciated. Cor: PMI normal. Regular rate and rythm. No rubs, gallops. Soft systolic murmur at RSB. S2 well preserved Lungs: clear Abdomen: soft, nontender, nondistended. No hepatosplenomegaly. No bruits or masses. Good bowel sounds. Extremities: no cyanosis, clubbing, rash, edema Neuro: alert & orientedx3, cranial nerves grossly intact. Moves all 4 extremities w/o difficulty. Affect pleasant.    EKG: NSR 61 bpm   ASSESSMENT & PLAN:

## 2012-08-04 NOTE — Addendum Note (Signed)
Encounter addended by: Merlene Pulling, CCT on: 08/04/2012  8:31 AM<BR>     Documentation filed: Charges VN

## 2012-08-20 ENCOUNTER — Encounter: Payer: Self-pay | Admitting: Internal Medicine

## 2012-09-10 ENCOUNTER — Other Ambulatory Visit (INDEPENDENT_AMBULATORY_CARE_PROVIDER_SITE_OTHER): Payer: Medicare Other

## 2012-09-10 DIAGNOSIS — I10 Essential (primary) hypertension: Secondary | ICD-10-CM

## 2012-09-10 DIAGNOSIS — E785 Hyperlipidemia, unspecified: Secondary | ICD-10-CM

## 2012-09-10 DIAGNOSIS — Z125 Encounter for screening for malignant neoplasm of prostate: Secondary | ICD-10-CM

## 2012-09-10 DIAGNOSIS — Z0000 Encounter for general adult medical examination without abnormal findings: Secondary | ICD-10-CM

## 2012-09-10 LAB — HEPATIC FUNCTION PANEL
Albumin: 3.9 g/dL (ref 3.5–5.2)
Total Protein: 6.3 g/dL (ref 6.0–8.3)

## 2012-09-10 LAB — POCT URINALYSIS DIPSTICK
Leukocytes, UA: NEGATIVE
Urobilinogen, UA: 1
pH, UA: 6

## 2012-09-10 LAB — CBC WITH DIFFERENTIAL/PLATELET
Basophils Absolute: 0.1 10*3/uL (ref 0.0–0.1)
Basophils Relative: 1.1 % (ref 0.0–3.0)
Eosinophils Relative: 6.9 % — ABNORMAL HIGH (ref 0.0–5.0)
HCT: 39.6 % (ref 39.0–52.0)
Hemoglobin: 13.1 g/dL (ref 13.0–17.0)
Lymphocytes Relative: 31.1 % (ref 12.0–46.0)
Lymphs Abs: 1.6 10*3/uL (ref 0.7–4.0)
Monocytes Relative: 8 % (ref 3.0–12.0)
Neutro Abs: 2.8 10*3/uL (ref 1.4–7.7)
RBC: 4.41 Mil/uL (ref 4.22–5.81)
RDW: 15.4 % — ABNORMAL HIGH (ref 11.5–14.6)
WBC: 5.3 10*3/uL (ref 4.5–10.5)

## 2012-09-10 LAB — LIPID PANEL
HDL: 67.6 mg/dL (ref >39.00)
LDL Cholesterol: 88 mg/dL (ref 0–99)
Total CHOL/HDL Ratio: 3
VLDL: 15.2 mg/dL (ref 0.0–40.0)

## 2012-09-10 LAB — BASIC METABOLIC PANEL
Calcium: 9.3 mg/dL (ref 8.4–10.5)
GFR: 104.61 mL/min (ref >60.00)
Glucose, Bld: 129 mg/dL — ABNORMAL HIGH (ref 70–99)
Potassium: 4.5 mEq/L (ref 3.5–5.1)
Sodium: 139 mEq/L (ref 135–145)

## 2012-09-10 LAB — PSA: PSA: 1.32 ng/mL (ref 0.10–4.00)

## 2012-09-10 LAB — TSH: TSH: 4.87 u[IU]/mL (ref 0.35–5.50)

## 2012-09-16 ENCOUNTER — Encounter: Payer: Self-pay | Admitting: Internal Medicine

## 2012-09-16 ENCOUNTER — Ambulatory Visit (INDEPENDENT_AMBULATORY_CARE_PROVIDER_SITE_OTHER): Payer: Medicare Other | Admitting: Internal Medicine

## 2012-09-16 VITALS — BP 140/80 | HR 68 | Temp 98.1°F | Resp 16 | Ht 75.0 in | Wt 270.0 lb

## 2012-09-16 DIAGNOSIS — S46219A Strain of muscle, fascia and tendon of other parts of biceps, unspecified arm, initial encounter: Secondary | ICD-10-CM | POA: Insufficient documentation

## 2012-09-16 DIAGNOSIS — B369 Superficial mycosis, unspecified: Secondary | ICD-10-CM

## 2012-09-16 DIAGNOSIS — B49 Unspecified mycosis: Secondary | ICD-10-CM

## 2012-09-16 DIAGNOSIS — Z0000 Encounter for general adult medical examination without abnormal findings: Secondary | ICD-10-CM

## 2012-09-16 DIAGNOSIS — R7309 Other abnormal glucose: Secondary | ICD-10-CM

## 2012-09-16 DIAGNOSIS — R7303 Prediabetes: Secondary | ICD-10-CM

## 2012-09-16 DIAGNOSIS — S46211A Strain of muscle, fascia and tendon of other parts of biceps, right arm, initial encounter: Secondary | ICD-10-CM

## 2012-09-16 MED ORDER — CLOTRIMAZOLE-BETAMETHASONE 1-0.05 % EX LOTN: TOPICAL_LOTION | Freq: Two times a day (BID) | CUTANEOUS | Status: DC

## 2012-09-16 NOTE — Progress Notes (Signed)
  Subjective:    Patient ID: [name] White, male    DOB: 07-15-1935, 77 y.o.   MRN: [id number]  HPI CPX Feels good Blood glucose elevated and weigth gain noted Going to the gym Slight elevation of LFTs on crestor with good lipids   Review of Systems  Constitutional: Negative for fever and fatigue.  HENT: Negative for hearing loss, congestion, neck pain and postnasal drip.   Eyes: Negative for discharge, redness and visual disturbance.  Respiratory: Negative for cough, shortness of breath and wheezing.   Cardiovascular: Negative for leg swelling.  Gastrointestinal: Negative for abdominal pain, constipation and abdominal distention.  Genitourinary: Negative for urgency and frequency.  Musculoskeletal: Negative for joint swelling and arthralgias.  Skin: Negative for color change and rash.  Neurological: Negative for weakness and light-headedness.  Hematological: Negative for adenopathy.  Psychiatric/Behavioral: Negative for behavioral problems.       Objective:   Physical Exam  Constitutional: He is oriented to person, place, and time. He appears well-developed and well-nourished.  HENT:  Head: Normocephalic and atraumatic.  Eyes: Conjunctivae are normal. Pupils are equal, round, and reactive to light.  Neck: Normal range of motion. Neck supple.  Cardiovascular: Normal rate and regular rhythm.   Murmur heard. Pulmonary/Chest: Effort normal and breath sounds normal.  Abdominal: Soft. Bowel sounds are normal.  Genitourinary: Prostate normal.  Enlarged prostate  Musculoskeletal: He exhibits edema and tenderness.  Neurological: He is alert and oriented to person, place, and time.  Skin: Skin is warm and dry.          Assessment & Plan:   Patient presents for yearly preventative medicine examination.   all immunizations and health maintenance protocols were reviewed with the patient and they are up to date with these protocols.   screening laboratory values were reviewed  with the patient including screening of hyperlipidemia PSA renal function and hepatic function.   There medications past medical history social history problem list and allergies were reviewed in detail.   Goals were established with regard to weight loss exercise diet in compliance with medications Elevated blood glucose and weight gain consistent with prediabetes discussed limiting grooms is a strategy for both weight loss and to control blood sugars.  If blood sugars remain elevated in A1c is clear and 6-1/2 we'll start metformin at next office visit in 3 months

## 2012-09-16 NOTE — Patient Instructions (Signed)
Weight loss  Limit gluten Breads pastas, thickened dressings  Avoid sandwiches  Instead of a BLT sandwich have a BLT salad with oil nad vinegar dressing  15 pounds

## 2012-09-30 ENCOUNTER — Other Ambulatory Visit (HOSPITAL_COMMUNITY): Payer: Self-pay | Admitting: *Deleted

## 2012-09-30 MED ORDER — APIXABAN 5 MG PO TABS: 5.0000 mg | ORAL_TABLET | Freq: Two times a day (BID) | ORAL | Status: DC

## 2012-09-30 MED ORDER — DOFETILIDE 500 MCG PO CAPS: 500.0000 ug | ORAL_CAPSULE | Freq: Two times a day (BID) | ORAL | Status: DC

## 2012-10-26 ENCOUNTER — Ambulatory Visit: Payer: Medicare Other | Admitting: Internal Medicine

## 2013-01-06 ENCOUNTER — Ambulatory Visit: Payer: Medicare Other | Admitting: Internal Medicine

## 2013-01-12 ENCOUNTER — Encounter: Payer: Self-pay | Admitting: Internal Medicine

## 2013-01-12 ENCOUNTER — Ambulatory Visit (INDEPENDENT_AMBULATORY_CARE_PROVIDER_SITE_OTHER): Payer: Medicare Other | Admitting: Internal Medicine

## 2013-01-12 VITALS — BP 156/82 | HR 76 | Temp 98.0°F | Resp 16 | Ht 75.0 in | Wt 234.0 lb

## 2013-01-12 DIAGNOSIS — I4891 Unspecified atrial fibrillation: Secondary | ICD-10-CM

## 2013-01-12 DIAGNOSIS — I10 Essential (primary) hypertension: Secondary | ICD-10-CM

## 2013-01-12 DIAGNOSIS — R7303 Prediabetes: Secondary | ICD-10-CM

## 2013-01-12 DIAGNOSIS — R7301 Impaired fasting glucose: Secondary | ICD-10-CM

## 2013-01-12 DIAGNOSIS — R7309 Other abnormal glucose: Secondary | ICD-10-CM

## 2013-01-12 DIAGNOSIS — E039 Hypothyroidism, unspecified: Secondary | ICD-10-CM

## 2013-01-12 DIAGNOSIS — Z23 Encounter for immunization: Secondary | ICD-10-CM

## 2013-01-12 NOTE — Progress Notes (Signed)
Subjective:    Patient ID: [name] White, male    DOB: 20-Mar-1936, 77 y.o.   MRN: [id number]  HPI  Has lost significant weight showing marked improvement in his glucose control of his A1c dropped into the normal range at 5.9.  Blood pressure control at home he does have whitecoat syndrome at the office his blood pressures at home range from the 120s to the 113 over the 60s.    Review of Systems  Constitutional: Negative for fever and fatigue.  HENT: Negative for congestion, hearing loss and postnasal drip.   Eyes: Negative for discharge, redness and visual disturbance.  Respiratory: Negative for cough, shortness of breath and wheezing.   Cardiovascular: Negative for leg swelling.  Gastrointestinal: Negative for abdominal pain, constipation and abdominal distention.  Genitourinary: Negative for urgency and frequency.  Musculoskeletal: Negative for arthralgias, joint swelling and neck pain.  Skin: Negative for color change and rash.  Neurological: Negative for weakness and light-headedness.  Hematological: Negative for adenopathy.  Psychiatric/Behavioral: Negative for behavioral problems.   Past Medical History  Diagnosis Date  . Sleep apnea   . Hyperlipidemia   . Hypothyroidism   . GERD (gastroesophageal reflux disease)   . Osteoarthritis   . Dyspnea     chronic  . Glucose intolerance (impaired glucose tolerance)   . Micturition syncope   . Hypertension   . Heart murmur     "slight"  . Atrial flutter     s/p ablation 07/17/08; echo EF 60-65% trivival AI  . Bradycardia   . H/O hiatal hernia   . Anxiety     History   Social History  . Marital Status: Married    Spouse Name: N/A    Number of Children: N/A  . Years of Education: N/A   Occupational History  . Not on file.   Social History Main Topics  . Smoking status: Former Smoker -- 0 years    Types: Pipe    Quit date: 04/01/1996  . Smokeless tobacco: Former Neurosurgeon    Types: Chew    Quit date: 04/01/2009  .  Alcohol Use: 3.5 oz/week    7 drink(s) per week     Comment: 08/12/11 "small glass of bourbon q night"  . Drug Use: No  . Sexual Activity: Not Currently   Other Topics Concern  . Not on file   Social History Narrative  . No narrative on file    Past Surgical History  Procedure Laterality Date  . Tonsillectomy  1940's  . Total knee arthroplasty  1990's    right  . Cataract extraction w/ intraocular lens  implant, bilateral  ~ 2007  . Cardiac electrophysiology mapping and ablation  06/2008    Family History  Problem Relation Age of Onset  . Multiple sclerosis Other   . Heart failure Other   . Hypertension Other     Allergies  Allergen Reactions  . Codeine Itching and Rash  . Statins Other (See Comments)    Made very sick, doctor thought had heart attack; "my appearance, color, etc"    Current Outpatient Prescriptions on File Prior to Visit  Medication Sig Dispense Refill  . amLODipine (NORVASC) 10 MG tablet Take 10 mg by mouth daily.       [name] apixaban (ELIQUIS) 5 MG TABS tablet Take 1 tablet (5 mg total) by mouth 2 (two) times daily.  60 tablet  6  . benazepril (LOTENSIN) 40 MG tablet Take 40 mg by mouth daily.       [name]  cloNIDine (CATAPRES) 0.3 MG tablet Take 0.15 mg by mouth 2 (two) times daily.       . clotrimazole-betamethasone (LOTRISONE) lotion Apply topically 2 (two) times daily.  30 mL  0  . co-enzyme Q-10 30 MG capsule Take 30 mg by mouth daily.      [name] dofetilide (TIKOSYN) 500 MCG capsule Take 1 capsule (500 mcg total) by mouth every 12 (twelve) hours.  60 capsule  6  . finasteride (PROSCAR) 5 MG tablet Take 5 mg by mouth daily.       . fish oil-omega-3 fatty acids 1000 MG capsule Take 2 g by mouth daily.       . fluticasone (FLONASE) 50 MCG/ACT nasal spray Place 2 sprays into the nose daily.  16 g  6  . levothyroxine (SYNTHROID) 137 MCG tablet Take 137 mcg by mouth daily.       . Multiple Vitamins-Minerals (CENTRUM SILVER PO) Take 0.5 tablets by mouth daily.        [name] omeprazole (PRILOSEC) 20 MG capsule Take 20 mg by mouth daily.       [name] PARoxetine (PAXIL) 40 MG tablet Take 40 mg by mouth every morning.       . rosuvastatin (CRESTOR) 5 MG tablet Take 2.5 mg by mouth daily. Taking 2.5 mg M and F and 5 mg on W       No current facility-administered medications on file prior to visit.    BP 156/82  Pulse 76  Temp(Src) 98 F (36.7 C)  Resp 16  Ht 6\' 3"  (1.905 m)  Wt 234 lb (106.142 kg)  BMI 29.25 kg/m2   Past Medical History  Diagnosis Date  . Sleep apnea   . Hyperlipidemia   . Hypothyroidism   . GERD (gastroesophageal reflux disease)   . Osteoarthritis   . Dyspnea     chronic  . Glucose intolerance (impaired glucose tolerance)   . Micturition syncope   . Hypertension   . Heart murmur     "slight"  . Atrial flutter     s/p ablation 07/17/08; echo EF 60-65% trivival AI  . Bradycardia   . H/O hiatal hernia   . Anxiety     History   Social History  . Marital Status: Married    Spouse Name: N/A    Number of Children: N/A  . Years of Education: N/A   Occupational History  . Not on file.   Social History Main Topics  . Smoking status: Former Smoker -- 0 years    Types: Pipe    Quit date: 04/01/1996  . Smokeless tobacco: Former Neurosurgeon    Types: Chew    Quit date: 04/01/2009  . Alcohol Use: 3.5 oz/week    7 drink(s) per week     Comment: 08/12/11 "small glass of bourbon q night"  . Drug Use: No  . Sexual Activity: Not Currently   Other Topics Concern  . Not on file   Social History Narrative  . No narrative on file    Past Surgical History  Procedure Laterality Date  . Tonsillectomy  1940's  . Total knee arthroplasty  1990's    right  . Cataract extraction w/ intraocular lens  implant, bilateral  ~ 2007  . Cardiac electrophysiology mapping and ablation  06/2008    Family History  Problem Relation Age of Onset  . Multiple sclerosis Other   . Heart failure Other   . Hypertension Other     Allergies  Allergen  Reactions  . Codeine Itching and Rash  . Statins Other (See Comments)    Made very sick, doctor thought had heart attack; "my appearance, color, etc"    Current Outpatient Prescriptions on File Prior to Visit  Medication Sig Dispense Refill  . amLODipine (NORVASC) 10 MG tablet Take 10 mg by mouth daily.       [name] apixaban (ELIQUIS) 5 MG TABS tablet Take 1 tablet (5 mg total) by mouth 2 (two) times daily.  60 tablet  6  . benazepril (LOTENSIN) 40 MG tablet Take 40 mg by mouth daily.       . cloNIDine (CATAPRES) 0.3 MG tablet Take 0.15 mg by mouth 2 (two) times daily.       . clotrimazole-betamethasone (LOTRISONE) lotion Apply topically 2 (two) times daily.  30 mL  0  . co-enzyme Q-10 30 MG capsule Take 30 mg by mouth daily.      [name] dofetilide (TIKOSYN) 500 MCG capsule Take 1 capsule (500 mcg total) by mouth every 12 (twelve) hours.  60 capsule  6  . finasteride (PROSCAR) 5 MG tablet Take 5 mg by mouth daily.       . fish oil-omega-3 fatty acids 1000 MG capsule Take 2 g by mouth daily.       . fluticasone (FLONASE) 50 MCG/ACT nasal spray Place 2 sprays into the nose daily.  16 g  6  . levothyroxine (SYNTHROID) 137 MCG tablet Take 137 mcg by mouth daily.       . Multiple Vitamins-Minerals (CENTRUM SILVER PO) Take 0.5 tablets by mouth daily.       [name] omeprazole (PRILOSEC) 20 MG capsule Take 20 mg by mouth daily.       [name] PARoxetine (PAXIL) 40 MG tablet Take 40 mg by mouth every morning.       . rosuvastatin (CRESTOR) 5 MG tablet Take 2.5 mg by mouth daily. Taking 2.5 mg M and F and 5 mg on W       No current facility-administered medications on file prior to visit.    BP 156/82  Pulse 76  Temp(Src) 98 F (36.7 C)  Resp 16  Ht 6\' 3"  (1.905 m)  Wt 234 lb (106.142 kg)  BMI 29.25 kg/m2        Objective:   Physical Exam  Constitutional: He appears well-developed and well-nourished.  HENT:  Head: Normocephalic and atraumatic.  Eyes: Conjunctivae are normal. Pupils are equal, round, and  reactive to light.  Neck: Normal range of motion. Neck supple.  Cardiovascular: Normal rate and regular rhythm.   Murmur heard. Pulmonary/Chest: Effort normal and breath sounds normal.  Abdominal: Soft. Bowel sounds are normal.          Assessment & Plan:  Weight loss and DM control Wants to go to the dietician [place] a1c with number dropping to 5.9 AF stable Home HTN stable

## 2013-01-12 NOTE — Patient Instructions (Signed)
The patient is instructed to continue all medications as prescribed. Schedule followup with check out clerk upon leaving the clinic  

## 2013-03-12 NOTE — Progress Notes (Addendum)
Patient ID: [name] White, male   DOB: 05/05/1935, 77 y.o.   MRN: [id number] PCP: Darryll Capers Also sees the [place] in [place]  HPI: [name] White is a delightful 77 year old male with a history of dyspnea with a normal Myoview in April 2007.  He also has a history of micturition syncope, hypertension, hyperlipidemia,borderline diabetes, obesity, sleep apnea. Has a. flutter and underwent a. flutter ablation by Dr. Ladona Ridgel.  Cardiac CT in 07/2011 Left Main: No plaque or stenosis.  Left Anterior Descending: Prominent mixed plaque in the proximal  LAD with probably moderate (50-70%) stenosis. The first diagonal  was a small vessel with calcified plaque at the ostium (could be  significant stenosis but cannot say for sure due to blooming  artifact from the calcium).  Left Circumflex: Mixed plaque in the proximal LCx with mild  stenosis.  Right Coronary Artery: Mixed plaque in the mid RCA with mild  stenosis. Dominant vessel.  Coronary Calcium Score: 355 Agatston units   Follow up: Doing well. Denies CP, SOB, or myalgias. Still going to the gym everyday, but having issues with L knee pain. Taking medications as prescribed. Denies palpitations.       Lab Results  Component Value Date   CHOL 171 09/10/2012   HDL 67.60 09/10/2012   LDLCALC 88 09/10/2012   LDLDIRECT 141.6 11/12/2010   TRIG 76.0 09/10/2012   CHOLHDL 3 09/10/2012   ROS: All systems negative except as listed in HPI, PMH and Problem List.  Past Medical History  Diagnosis Date  . Sleep apnea   . Hyperlipidemia   . Hypothyroidism   . GERD (gastroesophageal reflux disease)   . Osteoarthritis   . Dyspnea     chronic  . Glucose intolerance (impaired glucose tolerance)   . Micturition syncope   . Hypertension   . Heart murmur     "slight"  . Atrial flutter     s/p ablation 07/17/08; echo EF 60-65% trivival AI  . Bradycardia   . H/O hiatal hernia   . Anxiety     Current Outpatient Prescriptions  Medication Sig Dispense Refill  .  amLODipine (NORVASC) 10 MG tablet Take 10 mg by mouth daily.       [name] apixaban (ELIQUIS) 5 MG TABS tablet Take 1 tablet (5 mg total) by mouth 2 (two) times daily.  60 tablet  6  . benazepril (LOTENSIN) 40 MG tablet Take 40 mg by mouth daily.       . cloNIDine (CATAPRES) 0.3 MG tablet Take 0.15 mg by mouth 2 (two) times daily.       . clotrimazole-betamethasone (LOTRISONE) lotion Apply topically 2 (two) times daily.  30 mL  0  . co-enzyme Q-10 30 MG capsule Take 30 mg by mouth daily.      [name] dofetilide (TIKOSYN) 500 MCG capsule Take 1 capsule (500 mcg total) by mouth every 12 (twelve) hours.  60 capsule  6  . finasteride (PROSCAR) 5 MG tablet Take 5 mg by mouth daily.       . fish oil-omega-3 fatty acids 1000 MG capsule Take 2 g by mouth daily.       . fluticasone (FLONASE) 50 MCG/ACT nasal spray Place 2 sprays into the nose daily.  16 g  6  . levothyroxine (SYNTHROID) 137 MCG tablet Take 137 mcg by mouth daily.       . Multiple Vitamins-Minerals (CENTRUM SILVER PO) Take 0.5 tablets by mouth daily.       [name] omeprazole (PRILOSEC) 20 MG  capsule Take 20 mg by mouth daily.       [name] PARoxetine (PAXIL) 40 MG tablet Take 40 mg by mouth every morning.       . rosuvastatin (CRESTOR) 5 MG tablet Take 2.5 mg by mouth daily. Taking 2.5 mg M and F and 5 mg on W       No current facility-administered medications for this encounter.    Filed Vitals:   03/15/13 0916  BP: 140/76  Pulse: 54  Weight: 240 lb 6.4 oz (109.045 kg)  SpO2: 96%    PHYSICAL EXAM: General:  Well appearing. No resp difficulty HEENT: normal Neck: supple. JVP flat. Carotids 2+ bilaterally; + faint bilat bruits (likely radiated from AoV). No lymphadenopathy or thryomegaly appreciated. Cor: PMI normal. Regular rate and rythm. No rubs, gallops. Soft systolic murmur at RSB. S2 well preserved Lungs: clear Abdomen: soft, nontender, nondistended. No hepatosplenomegaly. No bruits or masses. Good bowel sounds. Extremities: no cyanosis,  clubbing, rash, edema Neuro: alert & orientedx3, cranial nerves grossly intact. Moves all 4 extremities w/o difficulty. Affect pleasant.  EKG: SB 50 bpm QTc  ASSESSMENT & PLAN:  1) CAD - No s/s of ischemia - Will continue ACE-I and statin. Not on BB with bradycardia  2) Atrial Flutter - s/p ablation 2010 - NSR today. Continue Tikosyn and eliquis 5 mg BID.  3) HTN - Continue norvasc, ACE-I and clonididne - Not on BB with bradycardia  4) Carotid Stenosis - carotid duplex 12/2011; stable 0-39% bilateral ICA stenosis - Will repeat in 1 year  F/U 1 year.  [name] B NP-C 9:43 AM  Patient seen and examined with [name], NP. We discussed all aspects of the encounter. I agree with the assessment and plan as stated above.   Doing great from a cardiac perspective. Main issue is arthritis in L knee. I have cleared him for knee replacement if he so desires. Maintaining SR on Tikosyn. Will continue that and Eliquis. BP looks great. Continue ASA and statin for moderate LAD stenosis seen on coronary CT.   [name],MD 9:56 AM

## 2013-03-15 ENCOUNTER — Ambulatory Visit (HOSPITAL_COMMUNITY)
Admission: RE | Admit: 2013-03-15 | Discharge: 2013-03-15 | Disposition: A | Discharge status: Home / Self Care | Payer: Medicare Other | Source: Ambulatory Visit | Attending: Internal Medicine | Admitting: Internal Medicine

## 2013-03-15 ENCOUNTER — Encounter (HOSPITAL_COMMUNITY): Payer: Self-pay

## 2013-03-15 VITALS — BP 140/76 | HR 54 | Wt 240.4 lb

## 2013-03-15 DIAGNOSIS — I4891 Unspecified atrial fibrillation: Secondary | ICD-10-CM

## 2013-03-15 DIAGNOSIS — I4892 Unspecified atrial flutter: Secondary | ICD-10-CM | POA: Insufficient documentation

## 2013-03-15 DIAGNOSIS — R0989 Other specified symptoms and signs involving the circulatory and respiratory systems: Secondary | ICD-10-CM

## 2013-03-15 DIAGNOSIS — I2510 Atherosclerotic heart disease of native coronary artery without angina pectoris: Secondary | ICD-10-CM | POA: Insufficient documentation

## 2013-03-15 DIAGNOSIS — I10 Essential (primary) hypertension: Secondary | ICD-10-CM

## 2013-04-14 ENCOUNTER — Other Ambulatory Visit (HOSPITAL_COMMUNITY): Payer: Self-pay | Admitting: Internal Medicine

## 2013-04-26 ENCOUNTER — Telehealth (HOSPITAL_COMMUNITY): Payer: Self-pay | Admitting: Cardiology

## 2013-04-26 NOTE — Telephone Encounter (Signed)
Can proceed with knee replacement without further cardiac work-up. He is at low risk for peri-op cardiac complications.

## 2013-04-26 NOTE — Telephone Encounter (Signed)
Received message from pt requesting a cardiac clearance letter for knee replacement sx -> Dr. Wynelle Link

## 2013-04-27 MED ORDER — APIXABAN 5 MG PO TABS: ORAL_TABLET | ORAL | Status: DC

## 2013-04-27 NOTE — Telephone Encounter (Signed)
Note faxed to Dr Maureen Ralphs, and pt aware, he request 90 day supply of Eliquis be mailed to him, will have Dr Haroldine Laws sign and mail tomorrow

## 2013-04-27 NOTE — Addendum Note (Signed)
Addended by: Scarlette Calico on: 04/27/2013 09:23 AM   Modules accepted: Orders

## 2013-07-01 ENCOUNTER — Other Ambulatory Visit: Payer: Self-pay | Admitting: Orthopedic Surgery

## 2013-07-14 ENCOUNTER — Encounter: Payer: Self-pay | Admitting: Family Medicine

## 2013-07-14 ENCOUNTER — Telehealth: Payer: Self-pay | Admitting: Internal Medicine

## 2013-07-14 ENCOUNTER — Ambulatory Visit (INDEPENDENT_AMBULATORY_CARE_PROVIDER_SITE_OTHER): Payer: Medicare Other | Admitting: Family Medicine

## 2013-07-14 VITALS — BP 180/90 | Temp 98.4°F | Wt 244.0 lb

## 2013-07-14 DIAGNOSIS — J309 Allergic rhinitis, unspecified: Secondary | ICD-10-CM

## 2013-07-14 NOTE — Patient Instructions (Signed)
Zyrtec 10 mg plain,,,,,,,,, 1 at bedtime  Steroid nasal spray,,,,,,,,, 1 shot up each nostril at bedtime  Call Dr. Arnoldo Morale if you have any other questions or concerns

## 2013-07-14 NOTE — Telephone Encounter (Signed)
Patient has an appointment scheduled with Dr. Arnoldo Morale on 09/20/13, and he wants to know if he can be worked in sooner on another issue. He has knee surgery scheduled for 08/02/13 and he would like to see if he can be seen before...please advised accordingly. His contact number has been verified. Thanks!

## 2013-07-14 NOTE — Telephone Encounter (Signed)
Pt saw dr. Sherren Mocha in [place] on 07/14/2013 at 11:00

## 2013-07-14 NOTE — Progress Notes (Signed)
   Subjective:    Patient ID: [name] White, male    DOB: 1936/02/20, 78 y.o.   MRN: [id number]  HPI Clay[place] is a 78 year old male who comes in today with a three-month history of allergic rhinitis. His symptoms are head congestion postnasal drip and nasal congestion. He has no fever. He has no discharge or facial pain. No sign of infection. His symptoms tend to come and go.  He has a history of sleep apnea and uses CPAP  He's due to have a left total knee replacement in 3 weeks and is concerned that this problem interfere with surgery.  He brings a list of a number of other concerns. I explained that I am  covering for today and I would take care of his immediate concern than any other long-term things he would need to discuss with Dr. Arnoldo Morale   Review of Systems    review of systems negative except for previous history of allergic rhinitis and asthma as a child Objective:   Physical Exam  Well-developed well-nourished male no acute distress vital signs stable he is afebrile except for BP 180/90. He says yesterday his blood pressure is 125/70.  HEENT negative except for 3+ nasal edema septum in the midline neck was supple no adenopathy lungs are clear      Assessment & Plan:  Allergic rhinitis.........[name] plain Zyrtec each bedtime along with one shot of steroid nasal spray each bedtime

## 2013-07-19 ENCOUNTER — Encounter (HOSPITAL_COMMUNITY): Payer: Self-pay | Admitting: Pharmacy Technician

## 2013-07-22 NOTE — Progress Notes (Signed)
Surgery clearance note Dr. Haroldine Laws 05/06/13 on chart, EKG 03/15/13 on [facility]

## 2013-07-22 NOTE — Patient Instructions (Addendum)
[place]  07/22/2013   Your procedure is scheduled on: 08/02/13  Report to [facility] at 5:00 AM.  Call this number if you have problems the morning of surgery 336-: [phone]   Remember:   Do not eat food or drink liquids After Midnight.     Take these medicines the morning of surgery with A SIP OF WATER: amlodipine, clonidine, flonase, levothyroxine, omeprazole, paroxetine, and tikosyn/dofetilide, zyrtec    Do not wear jewelry, make-up or nail polish.  Do not wear lotions, powders, or perfumes. You may wear deodorant.  Do not shave 48 hours prior to surgery. Men may shave face and neck.  Do not bring valuables to the hospital.  Contacts, dentures or bridgework may not be worn into surgery.  Leave suitcase in the car. After surgery it may be brought to your room.  For patients admitted to the hospital, checkout time is 11:00 AM the day of discharge.   Please read over the following fact sheets that you were given: MRSA Information Paulette Blanch, RN  pre op nurse call if needed [phone]    [facility] - Preparing for Surgery Before surgery, you can play an important role.  Because skin is not sterile, your skin needs to be as free of germs as possible.  You can reduce the number of germs on your skin by washing with CHG (chlorahexidine gluconate) soap before surgery.  CHG is an antiseptic cleaner which kills germs and bonds with the skin to continue killing germs even after washing. Please DO NOT use if you have an allergy to CHG or antibacterial soaps.  If your skin becomes reddened/irritated stop using the CHG and inform your nurse when you arrive at Short Stay. Do not shave (including legs and underarms) for at least 48 hours prior to the first CHG shower.  You may shave your face. Please follow these instructions carefully:  1.  Shower with CHG Soap the night before surgery and the  morning of Surgery.  2.  If you choose to wash your hair, wash your hair  first as usual with your  normal  shampoo.  3.  After you shampoo, rinse your hair and body thoroughly to remove the  shampoo.                            4.  Use CHG as you would any other liquid soap.  You can apply chg directly  to the skin and wash                       Gently with a scrungie or clean washcloth.  5.  Apply the CHG Soap to your body ONLY FROM THE NECK DOWN.   Do not use on open                           Wound or open sores. Avoid contact with eyes, ears mouth and genitals (private parts).                        Genitals (private parts) with your normal soap.             6.  Wash thoroughly, paying special attention to the area where your surgery  will be performed.  7.  Thoroughly rinse your body with warm water from the neck  down.  8.  DO NOT shower/wash with your normal soap after using and rinsing off  the CHG Soap.                9.  Pat yourself dry with a clean towel.            10.  Wear clean pajamas.            11.  Place clean sheets on your bed the night of your first shower and do not  sleep with pets. Day of Surgery : Do not apply any lotions/deodorants the morning of surgery.  Please wear clean clothes to the hospital/surgery center.  FAILURE TO FOLLOW THESE INSTRUCTIONS MAY RESULT IN THE CANCELLATION OF YOUR SURGERY PATIENT SIGNATURE_________________________________  NURSE SIGNATURE__________________________________   Incentive Spirometer  An incentive spirometer is a tool that can help keep your lungs clear and active. This tool measures how well you are filling your lungs with each breath. Taking long deep breaths may help reverse or decrease the chance of developing breathing (pulmonary) problems (especially infection) following:  A long period of time when you are unable to move or be active. BEFORE THE PROCEDURE   If the spirometer includes an indicator to show your best effort, your nurse or respiratory therapist will set it to a desired  goal.  If possible, sit up straight or lean slightly forward. Try not to slouch.  Hold the incentive spirometer in an upright position. INSTRUCTIONS FOR USE  1. Sit on the edge of your bed if possible, or sit up as far as you can in bed or on a chair. 2. Hold the incentive spirometer in an upright position. 3. Breathe out normally. 4. Place the mouthpiece in your mouth and seal your lips tightly around it. 5. Breathe in slowly and as deeply as possible, raising the piston or the ball toward the top of the column. 6. Hold your breath for 3-5 seconds or for as long as possible. Allow the piston or ball to fall to the bottom of the column. 7. Remove the mouthpiece from your mouth and breathe out normally. 8. Rest for a few seconds and repeat Steps 1 through 7 at least 10 times every 1-2 hours when you are awake. Take your time and take a few normal breaths between deep breaths. 9. The spirometer may include an indicator to show your best effort. Use the indicator as a goal to work toward during each repetition. 10. After each set of 10 deep breaths, practice coughing to be sure your lungs are clear. If you have an incision (the cut made at the time of surgery), support your incision when coughing by placing a pillow or rolled up towels firmly against it. Once you are able to get out of bed, walk around indoors and cough well. You may stop using the incentive spirometer when instructed by your caregiver.  RISKS AND COMPLICATIONS  Take your time so you do not get dizzy or light-headed.  If you are in pain, you may need to take or ask for pain medication before doing incentive spirometry. It is harder to take a deep breath if you are having pain. AFTER USE  Rest and breathe slowly and easily.  It can be helpful to keep track of a log of your progress. Your caregiver can provide you with a simple table to help with this. If you are using the spirometer at home, follow these instructions: [place] IF:  You are having difficultly using the spirometer.  You have trouble using the spirometer as often as instructed.  Your pain medication is not giving enough relief while using the spirometer.  You develop fever of 100.5 F (38.1 C) or higher. SEEK IMMEDIATE MEDICAL CARE IF:   You cough up bloody sputum that had not been present before.  You develop fever of 102 F (38.9 C) or greater.  You develop worsening pain at or near the incision site. MAKE SURE YOU:   Understand these instructions.  Will watch your condition.  Will get help right away if you are not doing well or get worse. Document Released: 07/29/2006 Document Revised: 06/10/2011 Document Reviewed: 09/29/2006 [facility] Patient Information 2014 [place], [place].   WHAT IS A BLOOD TRANSFUSION? Blood Transfusion Information  A transfusion is the replacement of blood or some of its parts. Blood is made up of multiple cells which provide different functions.  Red blood cells carry oxygen and are used for blood loss replacement.  White blood cells fight against infection.  Platelets control bleeding.  Plasma helps clot blood.  Other blood products are available for specialized needs, such as hemophilia or other clotting disorders. BEFORE THE TRANSFUSION  Who gives blood for transfusions?   Healthy volunteers who are fully evaluated to make sure their blood is safe. This is blood bank blood. Transfusion therapy is the safest it has ever been in the practice of medicine. Before blood is taken from a donor, a complete history is taken to make sure that person has no history of diseases nor engages in risky social behavior (examples are intravenous drug use or sexual activity with multiple partners). The donor's travel history is screened to minimize risk of transmitting infections, such as malaria. The donated blood is tested for signs of infectious diseases, such as HIV and hepatitis. The blood is then  tested to be sure it is compatible with you in order to minimize the chance of a transfusion reaction. If you or a relative donates blood, this is often done in anticipation of surgery and is not appropriate for emergency situations. It takes many days to process the donated blood. RISKS AND COMPLICATIONS Although transfusion therapy is very safe and saves many lives, the main dangers of transfusion include:   Getting an infectious disease.  Developing a transfusion reaction. This is an allergic reaction to something in the blood you were given. Every precaution is taken to prevent this. The decision to have a blood transfusion has been considered carefully by your caregiver before blood is given. Blood is not given unless the benefits outweigh the risks. AFTER THE TRANSFUSION  Right after receiving a blood transfusion, you will usually feel much better and more energetic. This is especially true if your red blood cells have gotten low (anemic). The transfusion raises the level of the red blood cells which carry oxygen, and this usually causes an energy increase.  The nurse administering the transfusion will monitor you carefully for complications. HOME CARE INSTRUCTIONS  No special instructions are needed after a transfusion. You may find your energy is better. Speak with your caregiver about any limitations on activity for underlying diseases you may have. SEEK MEDICAL CARE IF:   Your condition is not improving after your transfusion.  You develop redness or irritation at the intravenous (IV) site. SEEK IMMEDIATE MEDICAL CARE IF:  Any of the following symptoms occur over the next 12 hours:  Shaking chills.  You have a temperature by mouth above  102 F (38.9 C), not controlled by medicine.  Chest, back, or muscle pain.  People around you feel you are not acting correctly or are confused.  Shortness of breath or difficulty breathing.  Dizziness and fainting.  You get a rash or  develop hives.  You have a decrease in urine output.  Your urine turns a dark color or changes to pink, red, or brown. Any of the following symptoms occur over the next 10 days:  You have a temperature by mouth above 102 F (38.9 C), not controlled by medicine.  Shortness of breath.  Weakness after normal activity.  The white part of the eye turns yellow (jaundice).  You have a decrease in the amount of urine or are urinating less often.  Your urine turns a dark color or changes to pink, red, or brown. Document Released: 03/15/2000 Document Revised: 06/10/2011 Document Reviewed: 11/02/2007 [facility] Patient Information 2014 [place].

## 2013-07-26 ENCOUNTER — Encounter (HOSPITAL_COMMUNITY): Payer: Self-pay

## 2013-07-26 ENCOUNTER — Encounter (HOSPITAL_COMMUNITY)
Admission: RE | Admit: 2013-07-26 | Discharge: 2013-07-26 | Disposition: A | Discharge status: Home / Self Care | Payer: Medicare Other | Source: Ambulatory Visit | Attending: Orthopedic Surgery | Admitting: Orthopedic Surgery

## 2013-07-26 ENCOUNTER — Ambulatory Visit (HOSPITAL_COMMUNITY)
Admission: RE | Admit: 2013-07-26 | Discharge: 2013-07-26 | Disposition: A | Discharge status: Home / Self Care | Payer: Medicare Other | Source: Ambulatory Visit | Attending: Anesthesiology | Admitting: Anesthesiology

## 2013-07-26 DIAGNOSIS — Z01812 Encounter for preprocedural laboratory examination: Secondary | ICD-10-CM | POA: Insufficient documentation

## 2013-07-26 DIAGNOSIS — Z01818 Encounter for other preprocedural examination: Secondary | ICD-10-CM | POA: Insufficient documentation

## 2013-07-26 HISTORY — DX: Atherosclerotic heart disease of native coronary artery without angina pectoris: I25.10

## 2013-07-26 HISTORY — DX: Cardiac arrhythmia, unspecified: I49.9

## 2013-07-26 HISTORY — DX: Inflammatory liver disease, unspecified: K75.9

## 2013-07-26 HISTORY — DX: Adverse effect of unspecified anesthetic, initial encounter: T41.45XA

## 2013-07-26 LAB — PROTIME-INR
INR: 1.01 (ref 0.00–1.49)
Prothrombin Time: 13.1 seconds (ref 11.6–15.2)

## 2013-07-26 LAB — URINALYSIS, ROUTINE W REFLEX MICROSCOPIC
Bilirubin Urine: NEGATIVE
GLUCOSE, UA: NEGATIVE mg/dL
KETONES UR: NEGATIVE mg/dL
Leukocytes, UA: NEGATIVE
Nitrite: NEGATIVE
Protein, ur: NEGATIVE mg/dL
Specific Gravity, Urine: 1.018 (ref 1.005–1.030)
Urobilinogen, UA: 0.2 mg/dL (ref 0.0–1.0)
pH: 5.5 (ref 5.0–8.0)

## 2013-07-26 LAB — SURGICAL PCR SCREEN
MRSA, PCR: NEGATIVE
Staphylococcus aureus: POSITIVE — AB

## 2013-07-26 LAB — APTT: APTT: 31 s (ref 24–37)

## 2013-07-26 LAB — ABO/RH: ABO/RH(D): A POS

## 2013-07-26 LAB — CBC
HEMATOCRIT: 40.4 % (ref 39.0–52.0)
HEMOGLOBIN: 13.6 g/dL (ref 13.0–17.0)
MCH: 28.9 pg (ref 26.0–34.0)
MCHC: 33.7 g/dL (ref 30.0–36.0)
MCV: 85.8 fL (ref 78.0–100.0)
Platelets: 180 10*3/uL (ref 150–400)
RBC: 4.71 MIL/uL (ref 4.22–5.81)
RDW: 15.1 % (ref 11.5–15.5)
WBC: 6.3 10*3/uL (ref 4.0–10.5)

## 2013-07-26 LAB — COMPREHENSIVE METABOLIC PANEL
ALK PHOS: 56 U/L (ref 39–117)
ALT: 29 U/L (ref 0–53)
AST: 36 U/L (ref 0–37)
Albumin: 4 g/dL (ref 3.5–5.2)
BILIRUBIN TOTAL: 0.3 mg/dL (ref 0.3–1.2)
BUN: 12 mg/dL (ref 6–23)
CHLORIDE: 101 meq/L (ref 96–112)
CO2: 28 mEq/L (ref 19–32)
Calcium: 9.5 mg/dL (ref 8.4–10.5)
Creatinine, Ser: 0.74 mg/dL (ref 0.50–1.35)
GFR, EST NON AFRICAN AMERICAN: 87 mL/min — AB (ref >90)
GLUCOSE: 107 mg/dL — AB (ref 70–99)
Potassium: 4.2 mEq/L (ref 3.7–5.3)
Sodium: 140 mEq/L (ref 137–147)
Total Protein: 7.5 g/dL (ref 6.0–8.3)

## 2013-07-26 LAB — URINE MICROSCOPIC-ADD ON

## 2013-07-26 NOTE — Progress Notes (Signed)
Mupriocin 22 gram tube called into [facility] in [place], [place].  Pt notified, no questions or concerns.

## 2013-07-30 NOTE — Progress Notes (Signed)
Faxed received to "repeat UA cath in hospital." Fax placed on chart and UA ordered.

## 2013-08-01 ENCOUNTER — Other Ambulatory Visit: Payer: Self-pay | Admitting: Orthopedic Surgery

## 2013-08-01 DIAGNOSIS — M1710 Unilateral primary osteoarthritis, unspecified knee: Secondary | ICD-10-CM | POA: Diagnosis present

## 2013-08-01 DIAGNOSIS — M179 Osteoarthritis of knee, unspecified: Secondary | ICD-10-CM | POA: Diagnosis present

## 2013-08-01 NOTE — H&P (Signed)
[name] White DOB: May 02, 1935 Married / Language: English / Race: White Male  Date of Admission:  08-02-2013  Chief Complaint:  Left Knee Pain  History of Present Illness The patient is a 78 year old male who comes in for a preoperative History and Physical. The patient is scheduled for a left total knee arthroplasty to be performed by Dr. Dione Plover. Aluisio, MD at [facility] on 08-02-2013. The patient is a 78 year old male who presents for follow up of their knee. The patient is being followed for their left knee pain and osteoarthritis. Symptoms reported today include: pain, stiffness, giving way and instability. The patient feels that they are doing poorly. The patient indicates that they have questions or concerns today regarding total knee arthroplasty. He saw Molli Barrows PA-C on 04/23/13. That knee has been bothering him for a long time now. He states that when he got his right knee replaced by Dr. Telford Nab 10 years ago, he was initially supposed to get the left one replaced, but didn't at that time because he ended up cutting it. He had the right one done and did so well with that. The left one didn't bother him as much until recently. Currently the left one hurts at all times. It is limiting what he can and cannot do. He has had injections in the past which are not beneficial anymore. He is scheduled for the left total knee and ready to proceed at this time. They have been treated conservatively in the past for the above stated problem and despite conservative measures, they continue to have progressive pain and severe functional limitations and dysfunction. They have failed non-operative management including home exercise, medications, and injections. It is felt that they would benefit from undergoing total joint replacement. Risks and benefits of the procedure have been discussed with the patient and they elect to proceed with surgery. There are no active contraindications  to surgery such as ongoing infection or rapidly progressive neurological disease.   Allergies Statins Depletion *DIETARY PRODUCTS/DIETARY MANAGEMENT PRODUCTS*. "Bad Reaction" Piroxicam *ANALGESICS - ANTI-INFLAMMATORY*. Hives.   Problem List/Past Medical Primary osteoarthritis of one knee (715.16  M17.10) Rupture of long head biceps tendon, right,  Knee pain (719.46  M25.569). left Anxiety Disorder Coronary artery disease Cardiac Arrhythmia Gastroesophageal Reflux Disease Sleep Apnea. uses CPAP occassionally High blood pressure Heart murmur Osteoarthritis Hyperthyroidism Impaired Hearing Atrial Fibrillation Atrial Flutter   Family History Rheumatoid Arthritis. First Degree Relatives. mother and father Heart Disease. mother Hypertension. mother Heart disease in male family member before age 13    Social History Tobacco use. Former smoker. former smoker Illicit drug use. no Number of flights of stairs before winded. 2-3 Pain Contract. no Living situation. live with spouse Marital status. married Current work status. 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 current drinker; drinks wine; 5-7 per week Children. 2 Drug/Alcohol Rehab (Previously). no Exercise. Exercises daily; does gym / weights Advance Directives. Living Will, Healthcare POA    Medication History Benazepril HCl (40MG  Tablet, Oral) Active. AmLODIPine Besylate (10MG  Tablet, Oral) Active. CloNIDine HCl (0.3MG  Tablet, Oral) Active. Dofetilide (500MCG Capsule, Oral) Active. (TIKOSYN) CoQ-10 (100MG  Capsule, Oral) Active. Eliquis (5MG  Tablet, Oral) Active. Centrum Silver Ultra Mens ( Oral) Active. PARoxetine HCl (40MG  Tablet, Oral) Active. Omeprazole (20MG  Tablet DR, Oral) Active. Aleve (220MG  Capsule, 1 (one) Oral) Active. Levothyroxine Sodium (137MCG Tablet, Oral) Active. Finasteride (5MG  Tablet, Oral) Active.    Past Surgical History Total  Knee Replacement. right Colon Polyp Removal -  Colonoscopy Cataract Surgery. bilateral Tonsillectomy Foot Surgery. left Cardiac Cath with Heart Ablation Diagnostic Cardiac Cath    Review of Systems General:Not Present- Chills, Fever, Night Sweats, Fatigue, Weight Gain, Weight Loss and Memory Loss. Skin:Not Present- Hives, Itching, Rash, Eczema and Lesions. HEENT:Not Present- Tinnitus, Headache, Double Vision, Visual Loss, Hearing Loss and Dentures. Respiratory:Not Present- Shortness of breath with exertion, Shortness of breath at rest, Allergies, Coughing up blood and Chronic Cough. Cardiovascular:Not Present- Chest Pain, Racing/skipping heartbeats, Difficulty Breathing Lying Down, Murmur, Swelling and Palpitations. Gastrointestinal:Not Present- Bloody Stool, Heartburn, Abdominal Pain, Vomiting, Nausea, Constipation, Diarrhea, Difficulty Swallowing, Jaundice and Loss of appetitie. Male Genitourinary:Not Present- Urinary frequency, Blood in Urine, Weak urinary stream, Discharge, Flank Pain, Incontinence, Painful Urination, Urgency, Urinary Retention and Urinating at Night. Musculoskeletal:Present- Joint Swelling, Joint Pain and Morning Stiffness. Not Present- Muscle Weakness, Muscle Pain, Back Pain and Spasms. Neurological:Not Present- Tremor, Dizziness, Blackout spells, Paralysis, Difficulty with balance and Weakness. Psychiatric:Present- Insomnia.    Vitals Pulse: 76 (Regular) Resp.: 16 (Unlabored) BP: 128/78 (Sitting, Right Arm, Standard)     Physical Exam The physical exam findings are as follows:   General Mental Status - Alert, cooperative and good historian. General Appearance- pleasant. Not in acute distress. Orientation- Oriented X3. Build & Nutrition- Well nourished and Well developed.   Head and Neck Head- normocephalic, atraumatic . Neck Global Assessment- bruit auscultated on the right (faint, likely referred from the chest  murmur) and supple. no bruit auscultated on the left.   Eye Pupil- Bilateral- Regular and Round. Motion- Bilateral- EOMI.   Chest and Lung Exam Auscultation: Breath sounds:- clear at anterior chest wall and - clear at posterior chest wall. Adventitious sounds:- No Adventitious sounds.   Cardiovascular Auscultation:Rhythm- Regular rate and rhythm. Heart Sounds- S1 WNL and S2 WNL. Murmurs & Other Heart Sounds: Murmur 1:Location- Aortic Area. Timing- Holosystolic. Grade- III/VI. Character- Crescendo/Decrescendo.   Abdomen Palpation/Percussion:Tenderness- Abdomen is non-tender to palpation. Rigidity (guarding)- Abdomen is soft. Auscultation:Auscultation of the abdomen reveals - Bowel sounds normal.   Male Genitourinary Not done, not pertinent to present illness  Musculoskeletal Well developed male, alert and oriented in no apparent distress. His hips show normal motion. No discomfort. His right knee which has already been replaced has a range of motion 0 to 125 degrees with no tenderness or instability. Left knee - no effusion. Slight varus. Range 5 to 125, moderate crepitus on range of motion. Tenderness medial greater than lateral with no instability noted.  RADIOGRAPHS: Radiographs from last visit. AP and lateral of both knees, he has an LCS prosthesis on the right in good position. No periprosthetic abnormalities. On the left, he is bone on bone medial and patellofemoral.   Assessment & Plan Primary osteoarthritis of one knee (715.16  M17.10) Impression: Left Knee  Note: Plan is for a Left Total Knee Replacement by Dr. Wynelle Link.  Plan is to go home  PCP - Dr. Benay Pillow Cards - Dr. Haroldine Laws - Patient has been seen preoperatively and felt to be stable for surgery.  The patient will not receive TXA (tranexamic acid) due to: CAD  Signed electronically by Joelene Millin, III PA-C

## 2013-08-01 NOTE — Anesthesia Preprocedure Evaluation (Addendum)
Anesthesia Evaluation  Patient identified by MRN, date of birth, ID band Patient awake    Reviewed: Allergy & Precautions, H&P , NPO status , Patient's Chart, lab work & pertinent test results  History of Anesthesia Complications Negative for: history of anesthetic complications  Airway Mallampati: II TM Distance: >3 FB Neck ROM: Full    Dental  (+) Caps, Dental Advisory Given All upper front are capped:   Pulmonary shortness of breath, sleep apnea , former smoker,  breath sounds clear to auscultation  Pulmonary exam normal       Cardiovascular hypertension, Pt. on medications + CAD and + DOE negative cardio ROS  + dysrhythmias + Valvular Problems/Murmurs Rhythm:Regular Rate:Normal     Neuro/Psych PSYCHIATRIC DISORDERS Anxiety Depression negative neurological ROS     GI/Hepatic hiatal hernia, GERD-  ,(+) Hepatitis -  Endo/Other  negative endocrine ROSHypothyroidism   Renal/GU negative Renal ROS     Musculoskeletal negative musculoskeletal ROS (+)   Abdominal   Peds  Hematology negative hematology ROS (+)   Anesthesia Other Findings   Reproductive/Obstetrics                         Anesthesia Physical Anesthesia Plan  ASA: III  Anesthesia Plan: General   Post-op Pain Management:    Induction: Intravenous  Airway Management Planned: Oral ETT  Additional Equipment:   Intra-op Plan:   Post-operative Plan: Extubation in OR  Informed Consent: I have reviewed the patients History and Physical, chart, labs and discussed the procedure including the risks, benefits and alternatives for the proposed anesthesia with the patient or authorized representative who has indicated his/her understanding and acceptance.   Dental advisory given  Plan Discussed with: CRNA and Surgeon  Anesthesia Plan Comments:        Anesthesia Quick Evaluation

## 2013-08-02 ENCOUNTER — Encounter (HOSPITAL_COMMUNITY): Payer: Self-pay | Admitting: *Deleted

## 2013-08-02 ENCOUNTER — Encounter (HOSPITAL_COMMUNITY)
Admission: RE | Disposition: A | Discharge status: Home Health | Payer: Self-pay | Source: Ambulatory Visit | Attending: Orthopedic Surgery

## 2013-08-02 ENCOUNTER — Inpatient Hospital Stay (HOSPITAL_COMMUNITY)
Admission: RE | Admit: 2013-08-02 | Discharge: 2013-08-04 | DRG: 470 | Disposition: A | Discharge status: Home Health | Payer: Medicare Other | Source: Ambulatory Visit | Attending: Orthopedic Surgery | Admitting: Orthopedic Surgery

## 2013-08-02 ENCOUNTER — Inpatient Hospital Stay (HOSPITAL_COMMUNITY): Payer: Medicare Other | Admitting: Anesthesiology

## 2013-08-02 ENCOUNTER — Encounter (HOSPITAL_COMMUNITY): Payer: Medicare Other | Admitting: Anesthesiology

## 2013-08-02 DIAGNOSIS — Z8249 Family history of ischemic heart disease and other diseases of the circulatory system: Secondary | ICD-10-CM

## 2013-08-02 DIAGNOSIS — M1710 Unilateral primary osteoarthritis, unspecified knee: Principal | ICD-10-CM | POA: Diagnosis present

## 2013-08-02 DIAGNOSIS — D62 Acute posthemorrhagic anemia: Secondary | ICD-10-CM | POA: Diagnosis not present

## 2013-08-02 DIAGNOSIS — I4891 Unspecified atrial fibrillation: Secondary | ICD-10-CM | POA: Diagnosis present

## 2013-08-02 DIAGNOSIS — I4892 Unspecified atrial flutter: Secondary | ICD-10-CM | POA: Diagnosis present

## 2013-08-02 DIAGNOSIS — F411 Generalized anxiety disorder: Secondary | ICD-10-CM | POA: Diagnosis present

## 2013-08-02 DIAGNOSIS — I2510 Atherosclerotic heart disease of native coronary artery without angina pectoris: Secondary | ICD-10-CM | POA: Diagnosis present

## 2013-08-02 DIAGNOSIS — Z87891 Personal history of nicotine dependence: Secondary | ICD-10-CM

## 2013-08-02 DIAGNOSIS — G4733 Obstructive sleep apnea (adult) (pediatric): Secondary | ICD-10-CM | POA: Diagnosis present

## 2013-08-02 DIAGNOSIS — G4730 Sleep apnea, unspecified: Secondary | ICD-10-CM | POA: Diagnosis present

## 2013-08-02 DIAGNOSIS — K219 Gastro-esophageal reflux disease without esophagitis: Secondary | ICD-10-CM | POA: Diagnosis present

## 2013-08-02 DIAGNOSIS — Z96652 Presence of left artificial knee joint: Secondary | ICD-10-CM

## 2013-08-02 DIAGNOSIS — I10 Essential (primary) hypertension: Secondary | ICD-10-CM | POA: Diagnosis present

## 2013-08-02 DIAGNOSIS — M179 Osteoarthritis of knee, unspecified: Secondary | ICD-10-CM | POA: Diagnosis present

## 2013-08-02 DIAGNOSIS — E785 Hyperlipidemia, unspecified: Secondary | ICD-10-CM | POA: Diagnosis present

## 2013-08-02 DIAGNOSIS — E039 Hypothyroidism, unspecified: Secondary | ICD-10-CM | POA: Diagnosis present

## 2013-08-02 DIAGNOSIS — E0590 Thyrotoxicosis, unspecified without thyrotoxic crisis or storm: Secondary | ICD-10-CM | POA: Diagnosis present

## 2013-08-02 HISTORY — PX: TOTAL KNEE ARTHROPLASTY: SHX125

## 2013-08-02 LAB — TYPE AND SCREEN
ABO/RH(D): A POS
Antibody Screen: NEGATIVE

## 2013-08-02 LAB — URINALYSIS, ROUTINE W REFLEX MICROSCOPIC
Bilirubin Urine: NEGATIVE
GLUCOSE, UA: NEGATIVE mg/dL
Ketones, ur: NEGATIVE mg/dL
Leukocytes, UA: NEGATIVE
Nitrite: NEGATIVE
PH: 5.5 (ref 5.0–8.0)
PROTEIN: NEGATIVE mg/dL
Specific Gravity, Urine: 1.022 (ref 1.005–1.030)
Urobilinogen, UA: 0.2 mg/dL (ref 0.0–1.0)

## 2013-08-02 LAB — URINE MICROSCOPIC-ADD ON

## 2013-08-02 SURGERY — ARTHROPLASTY, KNEE, TOTAL
Anesthesia: General | Site: Knee | Laterality: Left

## 2013-08-02 MED ORDER — CEFAZOLIN SODIUM-DEXTROSE 2-3 GM-% IV SOLR
2.0000 g | INTRAVENOUS | Status: AC
  Administered 2013-08-02: 2 g via INTRAVENOUS

## 2013-08-02 MED ORDER — DOFETILIDE 500 MCG PO CAPS
500.0000 ug | ORAL_CAPSULE | Freq: Two times a day (BID) | ORAL | Status: DC
  Administered 2013-08-02 – 2013-08-04 (×4): 500 ug via ORAL
  Filled 2013-08-02 (×6): qty 1

## 2013-08-02 MED ORDER — SODIUM CHLORIDE 0.9 % IV SOLN: INTRAVENOUS | Status: DC

## 2013-08-02 MED ORDER — OXYCODONE HCL 5 MG PO TABS
5.0000 mg | ORAL_TABLET | ORAL | Status: DC | PRN
  Administered 2013-08-02: 10 mg via ORAL
  Administered 2013-08-03 – 2013-08-04 (×3): 5 mg via ORAL
  Filled 2013-08-02: qty 1
  Filled 2013-08-02: qty 2
  Filled 2013-08-02: qty 1
  Filled 2013-08-02: qty 2

## 2013-08-02 MED ORDER — ROCURONIUM BROMIDE 100 MG/10ML IV SOLN
INTRAVENOUS | Status: AC
  Filled 2013-08-02: qty 1

## 2013-08-02 MED ORDER — MIDAZOLAM HCL 5 MG/5ML IJ SOLN
INTRAMUSCULAR | Status: DC | PRN
  Administered 2013-08-02: 2 mg via INTRAVENOUS

## 2013-08-02 MED ORDER — METOCLOPRAMIDE HCL 10 MG PO TABS: 5.0000 mg | ORAL_TABLET | Freq: Three times a day (TID) | ORAL | Status: DC | PRN

## 2013-08-02 MED ORDER — ACETAMINOPHEN 325 MG PO TABS: 650.0000 mg | ORAL_TABLET | Freq: Four times a day (QID) | ORAL | Status: DC | PRN

## 2013-08-02 MED ORDER — DEXAMETHASONE SODIUM PHOSPHATE 10 MG/ML IJ SOLN
10.0000 mg | Freq: Once | INTRAMUSCULAR | Status: AC
  Administered 2013-08-02: 10 mg via INTRAVENOUS

## 2013-08-02 MED ORDER — LORATADINE 10 MG PO TABS
10.0000 mg | ORAL_TABLET | Freq: Every day | ORAL | Status: DC
  Administered 2013-08-03 – 2013-08-04 (×2): 10 mg via ORAL
  Filled 2013-08-02 (×3): qty 1

## 2013-08-02 MED ORDER — SODIUM CHLORIDE 0.9 % IR SOLN
Status: DC | PRN
  Administered 2013-08-02: 1000 mL

## 2013-08-02 MED ORDER — PROPOFOL 10 MG/ML IV BOLUS
INTRAVENOUS | Status: AC
  Filled 2013-08-02: qty 20

## 2013-08-02 MED ORDER — SUCCINYLCHOLINE CHLORIDE 20 MG/ML IJ SOLN
INTRAMUSCULAR | Status: DC | PRN
  Administered 2013-08-02: 100 mg via INTRAVENOUS

## 2013-08-02 MED ORDER — METHOCARBAMOL 1000 MG/10ML IJ SOLN
500.0000 mg | Freq: Four times a day (QID) | INTRAVENOUS | Status: DC | PRN
  Administered 2013-08-02: 500 mg via INTRAVENOUS
  Filled 2013-08-02: qty 5

## 2013-08-02 MED ORDER — HYDROMORPHONE HCL PF 1 MG/ML IJ SOLN
INTRAMUSCULAR | Status: DC | PRN
  Administered 2013-08-02 (×2): 0.5 mg via INTRAVENOUS

## 2013-08-02 MED ORDER — HYDROMORPHONE HCL PF 1 MG/ML IJ SOLN
INTRAMUSCULAR | Status: AC
  Filled 2013-08-02: qty 1

## 2013-08-02 MED ORDER — PHENOL 1.4 % MT LIQD: 1.0000 | OROMUCOSAL | Status: DC | PRN

## 2013-08-02 MED ORDER — DIPHENHYDRAMINE HCL 12.5 MG/5ML PO ELIX
12.5000 mg | ORAL_SOLUTION | ORAL | Status: DC | PRN
  Administered 2013-08-03: 25 mg via ORAL
  Filled 2013-08-02: qty 10

## 2013-08-02 MED ORDER — OXYCODONE HCL 5 MG PO TABS: 5.0000 mg | ORAL_TABLET | Freq: Once | ORAL | Status: DC | PRN

## 2013-08-02 MED ORDER — AMLODIPINE BESYLATE 10 MG PO TABS
10.0000 mg | ORAL_TABLET | Freq: Every morning | ORAL | Status: DC
  Administered 2013-08-03 – 2013-08-04 (×2): 10 mg via ORAL
  Filled 2013-08-02 (×2): qty 1

## 2013-08-02 MED ORDER — MEPERIDINE HCL 50 MG/ML IJ SOLN: 6.2500 mg | INTRAMUSCULAR | Status: DC | PRN

## 2013-08-02 MED ORDER — ONDANSETRON HCL 4 MG/2ML IJ SOLN
INTRAMUSCULAR | Status: DC | PRN
Start: 2013-08-02 — End: 2013-08-02
  Administered 2013-08-02: 4 mg via INTRAVENOUS

## 2013-08-02 MED ORDER — MORPHINE SULFATE 2 MG/ML IJ SOLN
1.0000 mg | INTRAMUSCULAR | Status: DC | PRN
  Administered 2013-08-02: 1 mg via INTRAVENOUS
  Filled 2013-08-02: qty 1

## 2013-08-02 MED ORDER — ROCURONIUM BROMIDE 100 MG/10ML IV SOLN
INTRAVENOUS | Status: DC | PRN
  Administered 2013-08-02: 30 mg via INTRAVENOUS

## 2013-08-02 MED ORDER — CLONIDINE HCL 0.3 MG PO TABS
0.1500 mg | ORAL_TABLET | Freq: Two times a day (BID) | ORAL | Status: DC
  Administered 2013-08-02: 0.15 mg via ORAL
  Filled 2013-08-02 (×3): qty 0.5

## 2013-08-02 MED ORDER — DEXAMETHASONE 4 MG PO TABS
10.0000 mg | ORAL_TABLET | Freq: Every day | ORAL | Status: AC
  Administered 2013-08-03: 10 mg via ORAL
  Filled 2013-08-02: qty 1

## 2013-08-02 MED ORDER — POLYETHYLENE GLYCOL 3350 17 G PO PACK
17.0000 g | PACK | Freq: Every day | ORAL | Status: DC | PRN
  Administered 2013-08-03: 17 g via ORAL

## 2013-08-02 MED ORDER — LIDOCAINE HCL (CARDIAC) 20 MG/ML IV SOLN
INTRAVENOUS | Status: AC
  Filled 2013-08-02: qty 5

## 2013-08-02 MED ORDER — METHOCARBAMOL 500 MG PO TABS
500.0000 mg | ORAL_TABLET | Freq: Four times a day (QID) | ORAL | Status: DC | PRN
  Administered 2013-08-03 – 2013-08-04 (×3): 500 mg via ORAL
  Filled 2013-08-02 (×3): qty 1

## 2013-08-02 MED ORDER — HYDROMORPHONE HCL PF 2 MG/ML IJ SOLN
INTRAMUSCULAR | Status: AC
  Filled 2013-08-02: qty 1

## 2013-08-02 MED ORDER — LACTATED RINGERS IV SOLN
INTRAVENOUS | Status: DC | PRN
  Administered 2013-08-02 (×3): via INTRAVENOUS

## 2013-08-02 MED ORDER — ACETAMINOPHEN 500 MG PO TABS
1000.0000 mg | ORAL_TABLET | Freq: Four times a day (QID) | ORAL | Status: AC
  Administered 2013-08-02 – 2013-08-03 (×3): 1000 mg via ORAL
  Filled 2013-08-02 (×4): qty 2

## 2013-08-02 MED ORDER — NEOSTIGMINE METHYLSULFATE 10 MG/10ML IV SOLN
INTRAVENOUS | Status: DC | PRN
  Administered 2013-08-02: 5 mg via INTRAVENOUS

## 2013-08-02 MED ORDER — FLEET ENEMA 7-19 GM/118ML RE ENEM: 1.0000 | ENEMA | Freq: Once | RECTAL | Status: AC | PRN

## 2013-08-02 MED ORDER — SODIUM CHLORIDE 0.9 % IJ SOLN
INTRAMUSCULAR | Status: AC
  Filled 2013-08-02: qty 50

## 2013-08-02 MED ORDER — ONDANSETRON HCL 4 MG/2ML IJ SOLN: 4.0000 mg | Freq: Four times a day (QID) | INTRAMUSCULAR | Status: DC | PRN

## 2013-08-02 MED ORDER — PROPOFOL 10 MG/ML IV BOLUS
INTRAVENOUS | Status: DC | PRN
  Administered 2013-08-02: 200 mg via INTRAVENOUS

## 2013-08-02 MED ORDER — BISACODYL 10 MG RE SUPP: 10.0000 mg | Freq: Every day | RECTAL | Status: DC | PRN

## 2013-08-02 MED ORDER — DEXAMETHASONE SODIUM PHOSPHATE 10 MG/ML IJ SOLN
10.0000 mg | Freq: Every day | INTRAMUSCULAR | Status: AC
  Filled 2013-08-02: qty 1

## 2013-08-02 MED ORDER — GLYCOPYRROLATE 0.2 MG/ML IJ SOLN
INTRAMUSCULAR | Status: AC
  Filled 2013-08-02: qty 3

## 2013-08-02 MED ORDER — OXYCODONE HCL 5 MG/5ML PO SOLN
5.0000 mg | Freq: Once | ORAL | Status: DC | PRN
  Filled 2013-08-02: qty 5

## 2013-08-02 MED ORDER — MENTHOL 3 MG MT LOZG
1.0000 | LOZENGE | OROMUCOSAL | Status: DC | PRN
  Filled 2013-08-02: qty 9

## 2013-08-02 MED ORDER — BUPIVACAINE LIPOSOME 1.3 % IJ SUSP
20.0000 mL | Freq: Once | INTRAMUSCULAR | Status: DC
  Filled 2013-08-02: qty 20

## 2013-08-02 MED ORDER — DOFETILIDE 500 MCG PO CAPS: 500.0000 ug | ORAL_CAPSULE | Freq: Two times a day (BID) | ORAL | Status: DC

## 2013-08-02 MED ORDER — FENTANYL CITRATE 0.05 MG/ML IJ SOLN
INTRAMUSCULAR | Status: AC
  Filled 2013-08-02: qty 2

## 2013-08-02 MED ORDER — CEFAZOLIN SODIUM-DEXTROSE 2-3 GM-% IV SOLR
INTRAVENOUS | Status: AC
  Filled 2013-08-02: qty 50

## 2013-08-02 MED ORDER — FINASTERIDE 5 MG PO TABS
5.0000 mg | ORAL_TABLET | Freq: Every day | ORAL | Status: DC
  Administered 2013-08-02 – 2013-08-03 (×2): 5 mg via ORAL
  Filled 2013-08-02 (×3): qty 1

## 2013-08-02 MED ORDER — MIDAZOLAM HCL 2 MG/2ML IJ SOLN
INTRAMUSCULAR | Status: AC
  Filled 2013-08-02: qty 2

## 2013-08-02 MED ORDER — PAROXETINE HCL 20 MG PO TABS
40.0000 mg | ORAL_TABLET | Freq: Every day | ORAL | Status: DC
  Administered 2013-08-03 – 2013-08-04 (×2): 40 mg via ORAL
  Filled 2013-08-02 (×3): qty 2

## 2013-08-02 MED ORDER — BUPIVACAINE LIPOSOME 1.3 % IJ SUSP
INTRAMUSCULAR | Status: DC | PRN
Start: 2013-08-02 — End: 2013-08-02
  Administered 2013-08-02: 20 mL

## 2013-08-02 MED ORDER — 0.9 % SODIUM CHLORIDE (POUR BTL) OPTIME
TOPICAL | Status: DC | PRN
  Administered 2013-08-02: 1000 mL

## 2013-08-02 MED ORDER — BUPIVACAINE HCL 0.25 % IJ SOLN
INTRAMUSCULAR | Status: DC | PRN
  Administered 2013-08-02: 20 mL

## 2013-08-02 MED ORDER — HYDROMORPHONE HCL PF 1 MG/ML IJ SOLN
0.2500 mg | INTRAMUSCULAR | Status: DC | PRN
  Administered 2013-08-02: 0.25 mg via INTRAVENOUS
  Administered 2013-08-02: 0.5 mg via INTRAVENOUS
  Administered 2013-08-02: 0.25 mg via INTRAVENOUS

## 2013-08-02 MED ORDER — SODIUM CHLORIDE 0.9 % IV SOLN
INTRAVENOUS | Status: DC
  Administered 2013-08-02 – 2013-08-03 (×4): via INTRAVENOUS

## 2013-08-02 MED ORDER — LIDOCAINE HCL (CARDIAC) 20 MG/ML IV SOLN
INTRAVENOUS | Status: DC | PRN
  Administered 2013-08-02: 100 mg via INTRAVENOUS

## 2013-08-02 MED ORDER — GLYCOPYRROLATE 0.2 MG/ML IJ SOLN
INTRAMUSCULAR | Status: DC | PRN
  Administered 2013-08-02: 0.6 mg via INTRAVENOUS
  Administered 2013-08-02: 0.4 mg via INTRAVENOUS

## 2013-08-02 MED ORDER — PANTOPRAZOLE SODIUM 40 MG PO TBEC
40.0000 mg | DELAYED_RELEASE_TABLET | Freq: Every day | ORAL | Status: DC
  Administered 2013-08-03: 40 mg via ORAL
  Filled 2013-08-02: qty 1

## 2013-08-02 MED ORDER — CEFAZOLIN SODIUM-DEXTROSE 2-3 GM-% IV SOLR
2.0000 g | Freq: Four times a day (QID) | INTRAVENOUS | Status: AC
  Administered 2013-08-02 (×2): 2 g via INTRAVENOUS
  Filled 2013-08-02 (×2): qty 50

## 2013-08-02 MED ORDER — ACETAMINOPHEN 650 MG RE SUPP: 650.0000 mg | Freq: Four times a day (QID) | RECTAL | Status: DC | PRN

## 2013-08-02 MED ORDER — FENTANYL CITRATE 0.05 MG/ML IJ SOLN
INTRAMUSCULAR | Status: DC | PRN
  Administered 2013-08-02 (×2): 50 ug via INTRAVENOUS
  Administered 2013-08-02: 100 ug via INTRAVENOUS

## 2013-08-02 MED ORDER — BENAZEPRIL HCL 40 MG PO TABS
40.0000 mg | ORAL_TABLET | Freq: Every morning | ORAL | Status: DC
  Administered 2013-08-03 – 2013-08-04 (×2): 40 mg via ORAL
  Filled 2013-08-02 (×2): qty 1

## 2013-08-02 MED ORDER — LEVOTHYROXINE SODIUM 137 MCG PO TABS
137.0000 ug | ORAL_TABLET | Freq: Every morning | ORAL | Status: DC
  Administered 2013-08-03 – 2013-08-04 (×2): 137 ug via ORAL
  Filled 2013-08-02 (×3): qty 1

## 2013-08-02 MED ORDER — METOCLOPRAMIDE HCL 5 MG/ML IJ SOLN: 5.0000 mg | Freq: Three times a day (TID) | INTRAMUSCULAR | Status: DC | PRN

## 2013-08-02 MED ORDER — DOCUSATE SODIUM 100 MG PO CAPS
100.0000 mg | ORAL_CAPSULE | Freq: Two times a day (BID) | ORAL | Status: DC
  Administered 2013-08-02 – 2013-08-03 (×3): 100 mg via ORAL

## 2013-08-02 MED ORDER — RIVAROXABAN 10 MG PO TABS
10.0000 mg | ORAL_TABLET | Freq: Every day | ORAL | Status: DC
  Administered 2013-08-03 – 2013-08-04 (×2): 10 mg via ORAL
  Filled 2013-08-02 (×3): qty 1

## 2013-08-02 MED ORDER — SODIUM CHLORIDE 0.9 % IJ SOLN
INTRAMUSCULAR | Status: DC | PRN
  Administered 2013-08-02: 30 mL

## 2013-08-02 MED ORDER — ACETAMINOPHEN 10 MG/ML IV SOLN
1000.0000 mg | Freq: Once | INTRAVENOUS | Status: AC
  Administered 2013-08-02: 1000 mg via INTRAVENOUS
  Filled 2013-08-02: qty 100

## 2013-08-02 MED ORDER — FLUTICASONE PROPIONATE 50 MCG/ACT NA SUSP
2.0000 | Freq: Every day | NASAL | Status: DC
  Administered 2013-08-03 – 2013-08-04 (×2): 2 via NASAL
  Filled 2013-08-02: qty 16

## 2013-08-02 MED ORDER — STERILE WATER FOR IRRIGATION IR SOLN
Status: DC | PRN
  Administered 2013-08-02: 1500 mL

## 2013-08-02 MED ORDER — ONDANSETRON HCL 4 MG PO TABS: 4.0000 mg | ORAL_TABLET | Freq: Four times a day (QID) | ORAL | Status: DC | PRN

## 2013-08-02 MED ORDER — BUPIVACAINE HCL (PF) 0.25 % IJ SOLN
INTRAMUSCULAR | Status: AC
  Filled 2013-08-02: qty 30

## 2013-08-02 MED ORDER — PROMETHAZINE HCL 25 MG/ML IJ SOLN: 6.2500 mg | INTRAMUSCULAR | Status: DC | PRN

## 2013-08-02 SURGICAL SUPPLY — 60 items
BAG SPEC THK2 15X12 ZIP CLS (MISCELLANEOUS) ×1
BAG ZIPLOCK 12X15 (MISCELLANEOUS) ×3 IMPLANT
BANDAGE ELASTIC 6 VELCRO ST LF (GAUZE/BANDAGES/DRESSINGS) ×3 IMPLANT
BANDAGE ESMARK 6X9 LF (GAUZE/BANDAGES/DRESSINGS) ×1 IMPLANT
BLADE SAG 18X100X1.27 (BLADE) ×3 IMPLANT
BLADE SAW SGTL 11.0X1.19X90.0M (BLADE) ×3 IMPLANT
BNDG CMPR 9X6 STRL LF SNTH (GAUZE/BANDAGES/DRESSINGS) ×1
BNDG ESMARK 6X9 LF (GAUZE/BANDAGES/DRESSINGS) ×3
BOWL SMART MIX CTS (DISPOSABLE) ×3 IMPLANT
CAPT RP KNEE ×3 IMPLANT
CEMENT HV SMART SET (Cement) ×6 IMPLANT
CLOSURE WOUND 1/2 X4 (GAUZE/BANDAGES/DRESSINGS) ×2
CUFF TOURN SGL QUICK 34 (TOURNIQUET CUFF) ×3
CUFF TRNQT CYL 34X4X40X1 (TOURNIQUET CUFF) ×1 IMPLANT
DECANTER SPIKE VIAL GLASS SM (MISCELLANEOUS) ×3 IMPLANT
DRAPE EXTREMITY T 121X128X90 (DRAPE) ×3 IMPLANT
DRAPE POUCH INSTRU U-SHP 10X18 (DRAPES) ×3 IMPLANT
DRAPE U-SHAPE 47X51 STRL (DRAPES) ×3 IMPLANT
DRSG ADAPTIC 3X8 NADH LF (GAUZE/BANDAGES/DRESSINGS) ×3 IMPLANT
DRSG PAD ABDOMINAL 8X10 ST (GAUZE/BANDAGES/DRESSINGS) ×3 IMPLANT
DURAPREP 26ML APPLICATOR (WOUND CARE) ×3 IMPLANT
ELECT REM PT RETURN 9FT ADLT (ELECTROSURGICAL) ×3
ELECTRODE REM PT RTRN 9FT ADLT (ELECTROSURGICAL) ×1 IMPLANT
EVACUATOR 1/8 PVC DRAIN (DRAIN) ×3 IMPLANT
FACESHIELD WRAPAROUND (MASK) ×15 IMPLANT
FACESHIELD WRAPAROUND OR TEAM (MASK) ×5 IMPLANT
GLOVE BIO SURGEON STRL SZ7.5 (GLOVE) IMPLANT
GLOVE BIO SURGEON STRL SZ8 (GLOVE) ×3 IMPLANT
GLOVE BIOGEL PI IND STRL 8 (GLOVE) ×2 IMPLANT
GLOVE BIOGEL PI INDICATOR 8 (GLOVE) ×4
GLOVE SURG SS PI 6.5 STRL IVOR (GLOVE) IMPLANT
GOWN STRL REUS W/TWL LRG LVL3 (GOWN DISPOSABLE) ×3 IMPLANT
GOWN STRL REUS W/TWL XL LVL3 (GOWN DISPOSABLE) IMPLANT
HANDPIECE INTERPULSE COAX TIP (DISPOSABLE) ×3
IMMOBILIZER KNEE 20 (SOFTGOODS) ×6 IMPLANT
IMMOBILIZER KNEE 20 THIGH 36 (SOFTGOODS) IMPLANT
KIT BASIN OR (CUSTOM PROCEDURE TRAY) ×3 IMPLANT
MANIFOLD NEPTUNE II (INSTRUMENTS) ×3 IMPLANT
NDL SAFETY ECLIPSE 18X1.5 (NEEDLE) ×2 IMPLANT
NEEDLE HYPO 18GX1.5 SHARP (NEEDLE) ×6
NS IRRIG 1000ML POUR BTL (IV SOLUTION) ×3 IMPLANT
PACK TOTAL JOINT (CUSTOM PROCEDURE TRAY) ×3 IMPLANT
PADDING CAST COTTON 6X4 STRL (CAST SUPPLIES) ×7 IMPLANT
POSITIONER SURGICAL ARM (MISCELLANEOUS) ×3 IMPLANT
SET HNDPC FAN SPRY TIP SCT (DISPOSABLE) ×1 IMPLANT
SPONGE GAUZE 4X4 12PLY (GAUZE/BANDAGES/DRESSINGS) ×3 IMPLANT
STRIP CLOSURE SKIN 1/2X4 (GAUZE/BANDAGES/DRESSINGS) ×4 IMPLANT
SUCTION FRAZIER 12FR DISP (SUCTIONS) ×3 IMPLANT
SUT MNCRL AB 4-0 PS2 18 (SUTURE) ×3 IMPLANT
SUT VIC AB 2-0 CT1 27 (SUTURE) ×9
SUT VIC AB 2-0 CT1 TAPERPNT 27 (SUTURE) ×3 IMPLANT
SUT VLOC 180 0 24IN GS25 (SUTURE) ×3 IMPLANT
SYR 20CC LL (SYRINGE) ×3 IMPLANT
SYR 50ML LL SCALE MARK (SYRINGE) ×3 IMPLANT
TOWEL OR 17X26 10 PK STRL BLUE (TOWEL DISPOSABLE) ×3 IMPLANT
TOWEL OR NON WOVEN STRL DISP B (DISPOSABLE) IMPLANT
TRAY FOLEY CATH 14FRSI W/METER (CATHETERS) ×1 IMPLANT
TRAY FOLEY CATH 16FR SILVER (SET/KITS/TRAYS/PACK) ×2 IMPLANT
WATER STERILE IRR 1500ML POUR (IV SOLUTION) ×3 IMPLANT
WRAP KNEE MAXI GEL POST OP (GAUZE/BANDAGES/DRESSINGS) ×3 IMPLANT

## 2013-08-02 NOTE — Transfer of Care (Signed)
Immediate Anesthesia Transfer of Care Note  Patient: [name] White  Procedure(s) Performed: Procedure(s): LEFT TOTAL KNEE ARTHROPLASTY (Left)  Patient Location: PACU  Anesthesia Type:General  Level of Consciousness sedated  Airway & Oxygen Therapy: Patient Spontanous Breathing and Patient connected to face mask oxygen  Post-op Assessment: Report given to PACU RN and Post -op Vital signs reviewed and stable  Post vital signs: Reviewed and stable  Complications: No apparent anesthesia complications

## 2013-08-02 NOTE — Anesthesia Postprocedure Evaluation (Signed)
Anesthesia Post Note  Patient: [name] White  Procedure(s) Performed: Procedure(s) (LRB): LEFT TOTAL KNEE ARTHROPLASTY (Left)  Anesthesia type: General  Patient location: PACU  Post pain: Pain level controlled  Post assessment: Post-op Vital signs reviewed  Last Vitals: BP 130/66  Pulse 62  Temp(Src) 36.7 C (Oral)  Resp 15  Ht 6\' 3"  (1.905 m)  Wt 244 lb 0.1 oz (110.679 kg)  BMI 30.50 kg/m2  SpO2 92%  Post vital signs: Reviewed  Level of consciousness: sedated  Complications: No apparent anesthesia complications

## 2013-08-02 NOTE — Op Note (Signed)
Pre-operative diagnosis- Osteoarthritis  Left knee(s)  Post-operative diagnosis- Osteoarthritis Left knee(s)  Procedure-  Left  Total Knee Arthroplasty  Surgeon- Dione Plover. Deunta Beneke, MD  Assistant- Ardeen Jourdain, PA-C   Anesthesia-  General  EBL-* No blood loss amount entered *   Drains Hemovac  Tourniquet time-  Total Tourniquet Time Documented: Thigh (Left) - 38 minutes Total: Thigh (Left) - 38 minutes     Complications- None  Condition-PACU - hemodynamically stable.   Brief Clinical Note   [name] White is a 78 y.o. year old male with end stage OA of his left knee with progressively worsening pain and dysfunction. He has constant pain, with activity and at rest and significant functional deficits with difficulties even with ADLs. He has had extensive non-op management including analgesics, injections of cortisone and viscosupplements, and home exercise program, but remains in significant pain with significant dysfunction. Radiographs show bone on bone arthritis medial and aptellofemoral. He presents now for left Total Knee Arthroplasty.     Procedure in detail---   The patient is brought into the operating room and positioned supine on the operating table. After successful administration of  General,   a tourniquet is placed high on the  Left thigh(s) and the lower extremity is prepped and draped in the usual sterile fashion. Time out is performed by the operating team and then the  Left lower extremity is wrapped in Esmarch, knee flexed and the tourniquet inflated to 300 mmHg.       A midline incision is made with a ten blade through the subcutaneous tissue to the level of the extensor mechanism. A fresh blade is used to make a medial parapatellar arthrotomy. Soft tissue over the proximal medial tibia is subperiosteally elevated to the joint line with a knife and into the semimembranosus bursa with a Cobb elevator. Soft tissue over the proximal lateral tibia is elevated with  attention being paid to avoiding the patellar tendon on the tibial tubercle. The patella is everted, knee flexed 90 degrees and the ACL and PCL are removed. Findings are bone on bone medial and patellofemoral with large medial osteophytes.        The drill is used to create a starting hole in the distal femur and the canal is thoroughly irrigated with sterile saline to remove the fatty contents. The 5 degree Left  valgus alignment guide is placed into the femoral canal and the distal femoral cutting block is pinned to remove 10 mm off the distal femur. Resection is made with an oscillating saw.      The tibia is subluxed forward and the menisci are removed. The extramedullary alignment guide is placed referencing proximally at the medial aspect of the tibial tubercle and distally along the second metatarsal axis and tibial crest. The block is pinned to remove 42mm off the more deficient medial  side. Resection is made with an oscillating saw. Size 4is the most appropriate size for the tibia and the proximal tibia is prepared with the modular drill and keel punch for that size.      The femoral sizing guide is placed and size 5 is most appropriate. Rotation is marked off the epicondylar axis and confirmed by creating a rectangular flexion gap at 90 degrees. The size 5 cutting block is pinned in this rotation and the anterior, posterior and chamfer cuts are made with the oscillating saw. The intercondylar block is then placed and that cut is made.      Trial size 4  tibial component, trial size 5 posterior stabilized femur and a 10  mm posterior stabilized rotating platform insert trial is placed. Full extension is achieved with excellent varus/valgus and anterior/posterior balance throughout full range of motion. The patella is everted and thickness measured to be 25  mm. Free hand resection is taken to 14 mm, a 41 template is placed, lug holes are drilled, trial patella is placed, and it tracks normally.  Osteophytes are removed off the posterior femur with the trial in place. All trials are removed and the cut bone surfaces prepared with pulsatile lavage. Cement is mixed and once ready for implantation, the size 4 tibial implant, size  5 posterior stabilized femoral component, and the size 41 patella are cemented in place and the patella is held with the clamp. The trial insert is placed and the knee held in full extension. The Exparel (20 ml mixed with 30 ml saline) and .25% Bupivicaine, are injected into the extensor mechanism, posterior capsule, medial and lateral gutters and subcutaneous tissues.  All extruded cement is removed and once the cement is hard the permanent 10 mm posterior stabilized rotating platform insert is placed into the tibial tray.      The wound is copiously irrigated with saline solution and the extensor mechanism closed over a hemovac drain with #1 V-loc suture. The tourniquet is released for a total tourniquet time of 38  minutes. Flexion against gravity is 140 degrees and the patella tracks normally. Subcutaneous tissue is closed with 2.0 vicryl and subcuticular with running 4.0 Monocryl. The incision is cleaned and dried and steri-strips and a bulky sterile dressing are applied. The limb is placed into a knee immobilizer and the patient is awakened and transported to recovery in stable condition.      Please note that a surgical assistant was a medical necessity for this procedure in order to perform it in a safe and expeditious manner. Surgical assistant was necessary to retract the ligaments and vital neurovascular structures to prevent injury to them and also necessary for proper positioning of the limb to allow for anatomic placement of the prosthesis.   Dione Plover Desi Rowe, MD    08/02/2013, 8:17 AM

## 2013-08-02 NOTE — Progress Notes (Signed)
Utilization review completed.  

## 2013-08-02 NOTE — Progress Notes (Signed)
Pt will place on when ready for bed. Pt encouraged to call RT if needing any assistance.

## 2013-08-02 NOTE — Interval H&P Note (Signed)
History and Physical Interval Note:  08/02/2013 6:43 AM  [name] White  has presented today for surgery, with the diagnosis of left knee osteoarthritis  The various methods of treatment have been discussed with the patient and family. After consideration of risks, benefits and other options for treatment, the patient has consented to  Procedure(s): LEFT TOTAL KNEE ARTHROPLASTY (Left) as a surgical intervention .  The patient's history has been reviewed, patient examined, no change in status, stable for surgery.  I have reviewed the patient's chart and labs.  Questions were answered to the patient's satisfaction.     Dione Plover Corlis Angelica

## 2013-08-02 NOTE — Evaluation (Signed)
Physical Therapy Evaluation Patient Details Name: [name] White MRN: [id number] DOB: Aug 21, 1935 Today's Date: 08/02/2013   History of Present Illness  s/p L TKA; PMX; biceps tendon rupture, R TKA 70yrs ago, HTN  Clinical Impression  Pt will benefit from PT to address deficits below; Pt became dizzy and had LOC on eval requiring +2 to be lowered to chair; RN present throughout    Follow Up Recommendations Home health PT    Equipment Recommendations  None recommended by PT    Recommendations for Other Services       Precautions / Restrictions Precautions Precautions: Fall;Knee Precaution Comments: pt became dizzy and passed out Required Braces or Orthoses: Knee Immobilizer - Left Knee Immobilizer - Left: Discontinue once straight leg raise with < 10 degree lag Restrictions Other Position/Activity Restrictions: WBAT LLE      Mobility  Bed Mobility Overal bed mobility: Needs Assistance Bed Mobility: Supine to Sit     Supine to sit: Min assist     General bed mobility comments: cues for technique  Transfers Overall transfer level: Needs assistance Equipment used: Rolling walker (2 wheeled) Transfers: Sit to/from Stand Sit to Stand: Min assist;+2 physical assistance;Max assist         General transfer comment: cues for hand placement and wt shift; +2 max/total assist to control descent into chair due to pt LOC  Ambulation/Gait Ambulation/Gait assistance: Min assist;+2 physical assistance;+2 safety/equipment Ambulation Distance (Feet): 12 Feet Assistive device: Rolling walker (2 wheeled) Gait Pattern/deviations: Step-to pattern Gait velocity: decr   General Gait Details: cues for sequence, RW position; pt wtih decr knee control in stance on R although able to do  I SLR   Stairs            Wheelchair Mobility    Modified Rankin (Stroke Patients Only)       Balance Overall balance assessment: Needs assistance Sitting-balance support: Feet supported;No  upper extremity supported Sitting balance-Leahy Scale: Good       Standing balance-Leahy Scale: Fair Standing balance comment: until LOC                             Pertinent Vitals/Pain Knee pain controlled;  BP 150/74 HR 57 sats 98%    Home Living Family/patient expects to be discharged to:: Private residence Living Arrangements: Spouse/significant other Available Help at Discharge: Family Type of Home: House Home Access: Stairs to enter   [employer] of Steps: 8  or 2 Home Layout: Multi-level Home Equipment: Environmental consultant - 2 wheels;Walker - standard      Prior Function Level of Independence: Independent               Hand Dominance        Extremity/Trunk Assessment   Upper Extremity Assessment: Overall WFL for tasks assessed;Defer to OT evaluation           Lower Extremity Assessment: RLE deficits/detail RLE Deficits / Details: I SLR but with decr knee control during gait;       Communication   Communication: No difficulties  Cognition Arousal/Alertness: Awake/alert Behavior During Therapy: WFL for tasks assessed/performed Overall Cognitive Status: Within Functional Limits for tasks assessed                      General Comments      Exercises Total Joint Exercises Ankle Circles/Pumps: AROM;Both;10 reps      Assessment/Plan    PT Assessment Patient needs  continued PT services  PT Diagnosis Difficulty walking   PT Problem List Decreased strength;Decreased activity tolerance;Decreased balance;Decreased mobility;Decreased knowledge of use of DME  PT Treatment Interventions DME instruction;Gait training;Functional mobility training;Therapeutic activities;Therapeutic exercise;Patient/family education;Balance training;Stair training   PT Goals (Current goals can be found in the Care Plan section) Acute Rehab PT Goals Patient Stated Goal: I, home PT Goal Formulation: With patient Time For Goal Achievement:  08/06/13 Potential to Achieve Goals: Good    Frequency 7X/week   Barriers to discharge        Co-evaluation               End of Session Equipment Utilized During Treatment: Gait belt Activity Tolerance: Patient tolerated treatment well Patient left: in chair;with call bell/phone within reach Nurse Communication: Mobility status         Time: 5465-6812 PT Time Calculation (min): 24 min   Charges:   PT Evaluation $Initial PT Evaluation Tier I: 1 Procedure PT Treatments $Gait Training: 8-22 mins $Therapeutic Activity: 8-22 mins   PT G Codes:          Neil Crouch 08/02/2013, 4:37 PM

## 2013-08-02 NOTE — H&P (View-Only) (Signed)
[name] White DOB: 1936-02-13 Married / Language: English / Race: White Male  Date of Admission:  08-02-2013  Chief Complaint:  Left Knee Pain  History of Present Illness The patient is a 78 year old male who comes in for a preoperative History and Physical. The patient is scheduled for a left total knee arthroplasty to be performed by Dr. Dione Plover. Aluisio, MD at [facility] on 08-02-2013. The patient is a 78 year old male who presents for follow up of their knee. The patient is being followed for their left knee pain and osteoarthritis. Symptoms reported today include: pain, stiffness, giving way and instability. The patient feels that they are doing poorly. The patient indicates that they have questions or concerns today regarding total knee arthroplasty. He saw Molli Barrows PA-C on 04/23/13. That knee has been bothering him for a long time now. He states that when he got his right knee replaced by Dr. Telford Nab 10 years ago, he was initially supposed to get the left one replaced, but didn't at that time because he ended up cutting it. He had the right one done and did so well with that. The left one didn't bother him as much until recently. Currently the left one hurts at all times. It is limiting what he can and cannot do. He has had injections in the past which are not beneficial anymore. He is scheduled for the left total knee and ready to proceed at this time. They have been treated conservatively in the past for the above stated problem and despite conservative measures, they continue to have progressive pain and severe functional limitations and dysfunction. They have failed non-operative management including home exercise, medications, and injections. It is felt that they would benefit from undergoing total joint replacement. Risks and benefits of the procedure have been discussed with the patient and they elect to proceed with surgery. There are no active contraindications  to surgery such as ongoing infection or rapidly progressive neurological disease.   Allergies Statins Depletion *DIETARY PRODUCTS/DIETARY MANAGEMENT PRODUCTS*. "Bad Reaction" Piroxicam *ANALGESICS - ANTI-INFLAMMATORY*. Hives.   Problem List/Past Medical Primary osteoarthritis of one knee (715.16  M17.10) Rupture of long head biceps tendon, right,  Knee pain (719.46  M25.569). left Anxiety Disorder Coronary artery disease Cardiac Arrhythmia Gastroesophageal Reflux Disease Sleep Apnea. uses CPAP occassionally High blood pressure Heart murmur Osteoarthritis Hyperthyroidism Impaired Hearing Atrial Fibrillation Atrial Flutter   Family History Rheumatoid Arthritis. First Degree Relatives. mother and father Heart Disease. mother Hypertension. mother Heart disease in male family member before age 87    Social History Tobacco use. Former smoker. former smoker Illicit drug use. no Number of flights of stairs before winded. 2-3 Pain Contract. no Living situation. live with spouse Marital status. married Current work status. 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 current drinker; drinks wine; 5-7 per week Children. 2 Drug/Alcohol Rehab (Previously). no Exercise. Exercises daily; does gym / weights Advance Directives. Living Will, Healthcare POA    Medication History Benazepril HCl (40MG  Tablet, Oral) Active. AmLODIPine Besylate (10MG  Tablet, Oral) Active. CloNIDine HCl (0.3MG  Tablet, Oral) Active. Dofetilide (500MCG Capsule, Oral) Active. (TIKOSYN) CoQ-10 (100MG  Capsule, Oral) Active. Eliquis (5MG  Tablet, Oral) Active. Centrum Silver Ultra Mens ( Oral) Active. PARoxetine HCl (40MG  Tablet, Oral) Active. Omeprazole (20MG  Tablet DR, Oral) Active. Aleve (220MG  Capsule, 1 (one) Oral) Active. Levothyroxine Sodium (137MCG Tablet, Oral) Active. Finasteride (5MG  Tablet, Oral) Active.    Past Surgical History Total  Knee Replacement. right Colon Polyp Removal -  Colonoscopy Cataract Surgery. bilateral Tonsillectomy Foot Surgery. left Cardiac Cath with Heart Ablation Diagnostic Cardiac Cath    Review of Systems General:Not Present- Chills, Fever, Night Sweats, Fatigue, Weight Gain, Weight Loss and Memory Loss. Skin:Not Present- Hives, Itching, Rash, Eczema and Lesions. HEENT:Not Present- Tinnitus, Headache, Double Vision, Visual Loss, Hearing Loss and Dentures. Respiratory:Not Present- Shortness of breath with exertion, Shortness of breath at rest, Allergies, Coughing up blood and Chronic Cough. Cardiovascular:Not Present- Chest Pain, Racing/skipping heartbeats, Difficulty Breathing Lying Down, Murmur, Swelling and Palpitations. Gastrointestinal:Not Present- Bloody Stool, Heartburn, Abdominal Pain, Vomiting, Nausea, Constipation, Diarrhea, Difficulty Swallowing, Jaundice and Loss of appetitie. Male Genitourinary:Not Present- Urinary frequency, Blood in Urine, Weak urinary stream, Discharge, Flank Pain, Incontinence, Painful Urination, Urgency, Urinary Retention and Urinating at Night. Musculoskeletal:Present- Joint Swelling, Joint Pain and Morning Stiffness. Not Present- Muscle Weakness, Muscle Pain, Back Pain and Spasms. Neurological:Not Present- Tremor, Dizziness, Blackout spells, Paralysis, Difficulty with balance and Weakness. Psychiatric:Present- Insomnia.    Vitals Pulse: 76 (Regular) Resp.: 16 (Unlabored) BP: 128/78 (Sitting, Right Arm, Standard)     Physical Exam The physical exam findings are as follows:   General Mental Status - Alert, cooperative and good historian. General Appearance- pleasant. Not in acute distress. Orientation- Oriented X3. Build & Nutrition- Well nourished and Well developed.   Head and Neck Head- normocephalic, atraumatic . Neck Global Assessment- bruit auscultated on the right (faint, likely referred from the chest  murmur) and supple. no bruit auscultated on the left.   Eye Pupil- Bilateral- Regular and Round. Motion- Bilateral- EOMI.   Chest and Lung Exam Auscultation: Breath sounds:- clear at anterior chest wall and - clear at posterior chest wall. Adventitious sounds:- No Adventitious sounds.   Cardiovascular Auscultation:Rhythm- Regular rate and rhythm. Heart Sounds- S1 WNL and S2 WNL. Murmurs & Other Heart Sounds: Murmur 1:Location- Aortic Area. Timing- Holosystolic. Grade- III/VI. Character- Crescendo/Decrescendo.   Abdomen Palpation/Percussion:Tenderness- Abdomen is non-tender to palpation. Rigidity (guarding)- Abdomen is soft. Auscultation:Auscultation of the abdomen reveals - Bowel sounds normal.   Male Genitourinary Not done, not pertinent to present illness  Musculoskeletal Well developed male, alert and oriented in no apparent distress. His hips show normal motion. No discomfort. His right knee which has already been replaced has a range of motion 0 to 125 degrees with no tenderness or instability. Left knee - no effusion. Slight varus. Range 5 to 125, moderate crepitus on range of motion. Tenderness medial greater than lateral with no instability noted.  RADIOGRAPHS: Radiographs from last visit. AP and lateral of both knees, he has an LCS prosthesis on the right in good position. No periprosthetic abnormalities. On the left, he is bone on bone medial and patellofemoral.   Assessment & Plan Primary osteoarthritis of one knee (715.16  M17.10) Impression: Left Knee  Note: Plan is for a Left Total Knee Replacement by Dr. Wynelle Link.  Plan is to go home  PCP - Dr. Benay Pillow Cards - Dr. Haroldine Laws - Patient has been seen preoperatively and felt to be stable for surgery.  The patient will not receive TXA (tranexamic acid) due to: CAD  Signed electronically by Joelene Millin, III PA-C

## 2013-08-03 ENCOUNTER — Encounter (HOSPITAL_COMMUNITY): Payer: Self-pay | Admitting: Orthopedic Surgery

## 2013-08-03 DIAGNOSIS — D62 Acute posthemorrhagic anemia: Secondary | ICD-10-CM | POA: Diagnosis not present

## 2013-08-03 LAB — BASIC METABOLIC PANEL
BUN: 12 mg/dL (ref 6–23)
CO2: 26 mEq/L (ref 19–32)
Calcium: 8.7 mg/dL (ref 8.4–10.5)
Chloride: 104 mEq/L (ref 96–112)
Creatinine, Ser: 0.73 mg/dL (ref 0.50–1.35)
GFR, EST NON AFRICAN AMERICAN: 88 mL/min — AB (ref >90)
Glucose, Bld: 130 mg/dL — ABNORMAL HIGH (ref 70–99)
POTASSIUM: 4.1 meq/L (ref 3.7–5.3)
SODIUM: 140 meq/L (ref 137–147)

## 2013-08-03 LAB — CBC
HEMATOCRIT: 30.3 % — AB (ref 39.0–52.0)
Hemoglobin: 10.1 g/dL — ABNORMAL LOW (ref 13.0–17.0)
MCH: 28.8 pg (ref 26.0–34.0)
MCHC: 33.3 g/dL (ref 30.0–36.0)
MCV: 86.3 fL (ref 78.0–100.0)
PLATELETS: 154 10*3/uL (ref 150–400)
RBC: 3.51 MIL/uL — ABNORMAL LOW (ref 4.22–5.81)
RDW: 15 % (ref 11.5–15.5)
WBC: 10.6 10*3/uL — ABNORMAL HIGH (ref 4.0–10.5)

## 2013-08-03 MED ORDER — NON FORMULARY: 20.0000 mg | Freq: Every day | Status: DC

## 2013-08-03 MED ORDER — OMEPRAZOLE 20 MG PO CPDR
20.0000 mg | DELAYED_RELEASE_CAPSULE | Freq: Every day | ORAL | Status: DC
Start: 2013-08-03 — End: 2013-08-04
  Administered 2013-08-03 – 2013-08-04 (×2): 20 mg via ORAL
  Filled 2013-08-03 (×3): qty 1

## 2013-08-03 MED ORDER — CLONIDINE HCL 0.3 MG PO TABS
0.1500 mg | ORAL_TABLET | Freq: Two times a day (BID) | ORAL | Status: DC
  Administered 2013-08-03 – 2013-08-04 (×3): 0.15 mg via ORAL
  Filled 2013-08-03 (×5): qty 0.5

## 2013-08-03 MED ORDER — ALUM & MAG HYDROXIDE-SIMETH 200-200-20 MG/5ML PO SUSP
30.0000 mL | ORAL | Status: DC | PRN
  Filled 2013-08-03: qty 30

## 2013-08-03 NOTE — Evaluation (Addendum)
Occupational Therapy Evaluation Patient Details Name: [name] White MRN: [id number] DOB: 09-09-1935 Today's Date: 08/03/2013    History of Present Illness s/p L TKA; PMX; biceps tendon rupture, R TKA 86yrs ago, HTN   Clinical Impression   Pt up to the toilet for toilet transfer with min assist and verbal cues for safe technique. Discussed DME and safety recommendations. Will benefit from skilled OT services to improve ADL independence for d/c home with wife and son.    Follow Up Recommendations  No OT follow up    Equipment Recommendations  None recommended by OT    Recommendations for Other Services       Precautions / Restrictions Precautions Precautions: Fall;Knee Precaution Comments: monitor dizziness Required Braces or Orthoses: Knee Immobilizer - Left Knee Immobilizer - Left: Discontinue once straight leg raise with < 10 degree lag Restrictions Weight Bearing Restrictions: No Other Position/Activity Restrictions: WBAT LLE      Mobility Bed Mobility                  Transfers Overall transfer level: Needs assistance Equipment used: Rolling walker (2 wheeled) Transfers: Sit to/from Stand Sit to Stand: Min assist         General transfer comment: verbal cues for hand placement and LE management.     Balance                                            ADL Overall ADL's : Needs assistance/impaired Eating/Feeding: Independent;Sitting   Grooming: Wash/dry hands;Set up;Sitting   Upper Body Bathing: Set up;Sitting   Lower Body Bathing: Moderate assistance;Sit to/from stand   Upper Body Dressing : Set up;Sitting   Lower Body Dressing: Moderate assistance;Sit to/from stand   Toilet Transfer: Minimal assistance;Ambulation;RW;BSC   Toileting- Clothing Manipulation and Hygiene: Minimal assistance;Sit to/from stand       Functional mobility during ADLs: Minimal assistance;Rolling walker General ADL Comments: Pt has higher toilets  at home with vanity but reports he may use walker turned to the side as a grab bar. Discussed how this wouldnt be a safe option as walker my flip over if he bears weight on one side and walker not meant to serve as a grab bar. Discussed use of his 3in1 initially to have bilateral UE support and transition to higher toilet with vanity as he feels stronger/knee bcomes more flexibile. Pt agreeable. Also discussed when to wear KI, how to don/doff, and need to wear when up. Discussed how KI relates to showering and use of 3in1 as shower seat initially. He has a son that can lift 3in1 into shower. He has a built in seat but it is low.      Vision                     Perception     Praxis      Pertinent Vitals/Pain 2/10 L knee; throbbing; ice, reposition.     Hand Dominance     Extremity/Trunk Assessment Upper Extremity Assessment Upper Extremity Assessment: Overall WFL for tasks assessed           Communication Communication Communication: No difficulties   Cognition Arousal/Alertness: Awake/alert Behavior During Therapy: WFL for tasks assessed/performed Overall Cognitive Status: Within Functional Limits for tasks assessed  General Comments       Exercises       Shoulder Instructions      Home Living Family/patient expects to be discharged to:: Private residence Living Arrangements: Spouse/significant other Available Help at Discharge: Family Type of Home: House Home Access: Stairs to enter [employer] brewer of Steps: 8  or 2   Home Layout: Multi-level Alternate Level Stairs-Number of Steps: planning to elevator put in   Bathroom Shower/Tub: Walk-in shower   Bathroom Toilet: Handicapped height     Home Equipment: Environmental consultant - 2 wheels;Walker - standard;Bedside commode;Shower seat - built in          Prior Functioning/Environment Level of Independence: Independent             OT Diagnosis: Generalized weakness   OT  Problem List: Decreased knowledge of use of DME or AE;Decreased strength   OT Treatment/Interventions: Self-care/ADL training;Patient/family education;DME and/or AE instruction;Therapeutic activities    OT Goals(Current goals can be found in the care plan section) Acute Rehab OT Goals Patient Stated Goal: I, home OT Goal Formulation: With patient Time For Goal Achievement: 08/10/13 Potential to Achieve Goals: Good ADL Goals Pt Will Transfer to Toilet: with min guard assist;ambulating;bedside commode Pt Will Perform Toileting - Clothing Manipulation and hygiene: with min guard assist;sit to/from stand Pt Will Perform Tub/Shower Transfer: Shower transfer;with min guard assist;3 in 1  OT Frequency: Min 2X/week   Barriers to D/C:            Co-evaluation              End of Session Equipment Utilized During Treatment: Gait belt;Rolling walker;Left knee immobilizer  Activity Tolerance: Patient tolerated treatment well Patient left: in chair;with call bell/phone within reach   Time: 0947-1015 OT Time Calculation (min): 28 min Charges:  OT General Charges $OT Visit: 1 Procedure OT Evaluation $Initial OT Evaluation Tier I: 1 Procedure OT Treatments $Therapeutic Activity: 8-22 mins G-Codes:    Alycia Patten Avonda Toso [phone] 08/03/2013, 12:21 PM

## 2013-08-03 NOTE — Progress Notes (Signed)
   Subjective: 1 Day Post-Op Procedure(s) (LRB): LEFT TOTAL KNEE ARTHROPLASTY (Left) Patient reports pain as mild.   Patient seen in rounds with Dr. Wynelle Link. Patient is well, but has had some minor complaints of pain in the knee, requiring pain medications We will start therapy today. Already sitting up in chair. Plan is to go Home after hospital stay.  Objective: Vital signs in last 24 hours: Temp:  [97.7 F (36.5 C)-98.7 F (37.1 C)] 98 F (36.7 C) (05/05 0637) Pulse Rate:  [50-77] 67 (05/05 0637) Resp:  [12-16] 16 (05/05 0637) BP: (106-188)/(43-85) 188/81 mmHg (05/05 0637) SpO2:  [91 %-100 %] 96 % (05/05 0637) Weight:  [110.679 kg (244 lb 0.1 oz)] 110.679 kg (244 lb 0.1 oz) (05/04 1009)  Intake/Output from previous day:  Intake/Output Summary (Last 24 hours) at 08/03/13 0720 Last data filed at 08/03/13 7353  Gross per 24 hour  Intake 5955.82 ml  Output   6225 ml  Net -269.18 ml     Labs:  Recent Labs  08/03/13 0450  HGB 10.1*    Recent Labs  08/03/13 0450  WBC 10.6*  RBC 3.51*  HCT 30.3*  PLT 154    Recent Labs  08/03/13 0450  NA 140  K 4.1  CL 104  CO2 26  BUN 12  CREATININE 0.73  GLUCOSE 130*  CALCIUM 8.7   No results found for this basename: LABPT, INR,  in the last 72 hours  EXAM General - Patient is Alert, Appropriate and Oriented Extremity - Neurovascular intact Sensation intact distally Dressing - dressing C/D/I Motor Function - intact, moving foot and toes well on exam.  Hemovac pulled without difficulty.  Past Medical History  Diagnosis Date  . Sleep apnea   . Hypothyroidism   . GERD (gastroesophageal reflux disease)   . Osteoarthritis   . Glucose intolerance (impaired glucose tolerance)   . Micturition syncope   . Hypertension   . Heart murmur     "slight"  . Atrial flutter     s/p ablation 07/17/08; echo EF 60-65% trivival AI  . Bradycardia   . H/O hiatal hernia   . Anxiety   . Dysrhythmia     a-fib.   . Coronary  artery disease     "slight"  . Complication of anesthesia     "during ablation-heard people talking and he was groaning"  . Hepatitis     h/o of type A    Assessment/Plan: 1 Day Post-Op Procedure(s) (LRB): LEFT TOTAL KNEE ARTHROPLASTY (Left) Principal Problem:   OA (osteoarthritis) of knee  Estimated body mass index is 30.5 kg/(m^2) as calculated from the following:   Height as of this encounter: 6\' 3"  (1.905 m).   Weight as of this encounter: 110.679 kg (244 lb 0.1 oz). Advance diet Up with therapy Plan for discharge tomorrow Discharge home with home health  DVT Prophylaxis - Xarelto Weight-Bearing as tolerated to left leg D/C O2 and Pulse OX and try on Room Air  Arlee Muslim, PA-C Orthopaedic Surgery 08/03/2013, 7:20 AM

## 2013-08-03 NOTE — Progress Notes (Signed)
Physical Therapy Treatment Patient Details Name: [name] White MRN: [id number] DOB: Aug 23, 1935 Today's Date: 08/03/2013    History of Present Illness s/p L TKA; PMX; biceps tendon rupture, R TKA 22yrs ago, HTN    PT Comments    POD # 1 am session.  Applied KI and instructed pt on use for amb.  Assisted OOB to amb in hallway.  Positioned in recliner and applied ICE.    Follow Up Recommendations  Home health PT     Equipment Recommendations  None recommended by PT    Recommendations for Other Services       Precautions / Restrictions Precautions Precautions: Fall;Knee Precaution Comments: Instructed pt on KI use for amb Required Braces or Orthoses: Knee Immobilizer - Left Knee Immobilizer - Left: Discontinue once straight leg raise with < 10 degree lag Restrictions Weight Bearing Restrictions: No Other Position/Activity Restrictions: WBAT LLE    Mobility  Bed Mobility Overal bed mobility: Needs Assistance       Supine to sit: Min assist     General bed mobility comments: cues for technique and increased time  Transfers Overall transfer level: Needs assistance Equipment used: Rolling walker (2 wheeled) Transfers: Sit to/from Stand Sit to Stand: Min assist         General transfer comment: verbal cues for hand placement and LE management. plus increased time.  Ambulation/Gait Ambulation/Gait assistance: Min assist Ambulation Distance (Feet): 48 Feet Assistive device: Rolling walker (2 wheeled) Gait Pattern/deviations: Decreased stance time - left;Step-to pattern Gait velocity: decr   General Gait Details: increased time and ,25% VC's on safety with turns and negociating in tight spaces.   Stairs            Wheelchair Mobility    Modified Rankin (Stroke Patients Only)       Balance                                    Cognition Arousal/Alertness: Awake/alert Behavior During Therapy: WFL for tasks assessed/performed Overall  Cognitive Status: Within Functional Limits for tasks assessed                      Exercises      General Comments        Pertinent Vitals/Pain     Home Living Family/patient expects to be discharged to:: Private residence Living Arrangements: Spouse/significant other Available Help at Discharge: Family Type of Home: House Home Access: Stairs to enter   Home Layout: Multi-level Home Equipment: Environmental consultant - 2 wheels;Walker - standard;Bedside commode;Shower seat - built in      Prior Function Level of Independence: Independent          PT Goals (current goals can now be found in the care plan section) Acute Rehab PT Goals Patient Stated Goal: I, home Progress towards PT goals: Progressing toward goals    Frequency  7X/week    PT Plan      Co-evaluation             End of Session Equipment Utilized During Treatment: Gait belt;Left knee immobilizer Activity Tolerance: Patient tolerated treatment well Patient left: in chair;with call bell/phone within reach     Time: 0906-0932 PT Time Calculation (min): 26 min  Charges:  $Gait Training: 8-22 mins $Therapeutic Activity: 8-22 mins  G Codes:      Rica Koyanagi  PTA [facility]  Acute  Rehab Pager      [phone]

## 2013-08-03 NOTE — Progress Notes (Signed)
Pt stated that he isn't going to wear the CPAP tonight.  Pt to notify RT if he changes his mind.  RT to monitor and assess as needed.

## 2013-08-03 NOTE — Progress Notes (Signed)
Physical Therapy Treatment Patient Details Name: [name] White MRN: [id number] DOB: 23-Oct-1935 Today's Date: 08/03/2013    History of Present Illness s/p L TKA; PMX; biceps tendon rupture, R TKA 80yrs ago, HTN    PT Comments    POD # 1 pm session.  Amb to BR then in hallway.  Assisted back to bed then performed TKR TE's.  Pt progressing well.  Follow Up Recommendations  Home health PT     Equipment Recommendations  None recommended by PT    Recommendations for Other Services       Precautions / Restrictions Precautions Precautions: Fall;Knee Precaution Comments: Instructed pt on KI use for amb Required Braces or Orthoses: Knee Immobilizer - Left Knee Immobilizer - Left: Discontinue once straight leg raise with < 10 degree lag Restrictions Weight Bearing Restrictions: No Other Position/Activity Restrictions: WBAT LLE    Mobility  Bed Mobility Overal bed mobility: Needs Assistance Bed Mobility: Sit to Supine     Supine to sit: Min assist Sit to supine: Min assist   General bed mobility comments: back to bed with increased time and Min Assist to support L LE  Transfers Overall transfer level: Needs assistance Equipment used: Rolling walker (2 wheeled) Transfers: Sit to/from Stand Sit to Stand: Min assist;Min guard         General transfer comment: verbal cues for hand placement and LE management. plus increased time.  Ambulation/Gait Ambulation/Gait assistance: Min guard;Min assist Ambulation Distance (Feet): 78 Feet Assistive device: Rolling walker (2 wheeled) Gait Pattern/deviations: Step-to pattern;Step-through pattern;Decreased stance time - left Gait velocity: decr   General Gait Details: increased time and ,25% VC's on safety with turns and negociating in tight spaces.   Stairs            Wheelchair Mobility    Modified Rankin (Stroke Patients Only)       Balance                                    Cognition                             Exercises  10 reps SLR AAROM L LE 10 reps heel slides AAROM L LE 10 reps hip ABD/ADD AAROM L LE    General Comments        Pertinent Vitals/Pain C/o 4/10 pain Pre medicated    Home Living                      Prior Function            PT Goals (current goals can now be found in the care plan section) Progress towards PT goals: Progressing toward goals    Frequency  7X/week    PT Plan      Co-evaluation             End of Session Equipment Utilized During Treatment: Gait belt;Left knee immobilizer Activity Tolerance: Patient tolerated treatment well Patient left: in bed;with call bell/phone within reach     Time: 7741-2878 PT Time Calculation (min): 30 min  Charges:  $Gait Training: 8-22 mins $Therapeutic Exercise: 8-22 mins $Therapeutic Activity: 8-22 mins                    G Codes:      Rica Koyanagi  PTA [facility]  Acute  Rehab Pager      [phone]

## 2013-08-03 NOTE — Care Management Note (Addendum)
  Page 1 of 1   08/03/2013     10:40:48 AM CARE MANAGEMENT NOTE 08/03/2013  Patient:  CLELL, TRAHAN   Account Number:  [id number]  Date Initiated:  08/03/2013  Documentation initiated by:  [employer]  Subjective/Objective Assessment:   adm: L knee pain/osteoarthritis/L TOTAL KNEE ARTHROPLASTY     Action/Plan:   discharge planning   Anticipated DC Date:  08/04/2013   Anticipated DC Plan:  [place]  CM consult      [facility]  HOME HEALTH   Choice offered to / List presented to:  C-1 Patient        [place] arranged  HH-2 PT      [place]   Status of service:  Completed, signed off Medicare Important Message given?   (If response is "NO", the following Medicare IM given date fields will be blank) Date Medicare IM given:   Date Additional Medicare IM given:    Discharge Disposition:  [place]  Per UR Regulation:    If discussed at Long Length of Stay Meetings, dates discussed:    Comments:  08/03/13 10:35 CM met with pt in room to confirm choice.  Pt chooses [facility] for HHPt.  No DME recc.  Address and contact information verified.  CM called [facility] [employer] rep, Debbie to let her know pt is having a renovation at his house and please have physical therapist go around to the back sunroom entrance.  CM gave and explained IM to pt. No other CM needs were communicated.  Mariane Masters, BSN, CM [phone].

## 2013-08-04 LAB — BASIC METABOLIC PANEL
BUN: 11 mg/dL (ref 6–23)
CO2: 26 meq/L (ref 19–32)
Calcium: 8.8 mg/dL (ref 8.4–10.5)
Chloride: 102 mEq/L (ref 96–112)
Creatinine, Ser: 0.61 mg/dL (ref 0.50–1.35)
GFR calc Af Amer: >90 mL/min (ref >90)
GFR calc non Af Amer: >90 mL/min (ref >90)
GLUCOSE: 122 mg/dL — AB (ref 70–99)
Potassium: 4.1 mEq/L (ref 3.7–5.3)
SODIUM: 139 meq/L (ref 137–147)

## 2013-08-04 LAB — CBC
HCT: 30.2 % — ABNORMAL LOW (ref 39.0–52.0)
HEMOGLOBIN: 10 g/dL — AB (ref 13.0–17.0)
MCH: 28.8 pg (ref 26.0–34.0)
MCHC: 33.1 g/dL (ref 30.0–36.0)
MCV: 87 fL (ref 78.0–100.0)
PLATELETS: 151 10*3/uL (ref 150–400)
RBC: 3.47 MIL/uL — ABNORMAL LOW (ref 4.22–5.81)
RDW: 15.5 % (ref 11.5–15.5)
WBC: 12.3 10*3/uL — AB (ref 4.0–10.5)

## 2013-08-04 MED ORDER — LIP MEDEX EX OINT
TOPICAL_OINTMENT | CUTANEOUS | Status: AC
  Filled 2013-08-04: qty 7

## 2013-08-04 MED ORDER — OXYCODONE HCL 5 MG PO TABS: 5.0000 mg | ORAL_TABLET | ORAL | Status: DC | PRN

## 2013-08-04 MED ORDER — METHOCARBAMOL 500 MG PO TABS: 500.0000 mg | ORAL_TABLET | Freq: Four times a day (QID) | ORAL | Status: DC | PRN

## 2013-08-04 NOTE — Progress Notes (Signed)
Physical Therapy Treatment Patient Details Name: [name] White MRN: [id number] DOB: [birth year] Today's Date: 08/04/2013    History of Present Illness s/p L TKA; PMX; biceps tendon rupture, R TKA 37yrs ago, HTN    PT Comments    POD #2 pt ready to D/C to home today.  Applied KI as pt was unable to perform active SLR.  Amb in hallway.  Practiced going up 2 steps forward with RW due to no rail.  Performed all supine TKR TE's following handout.  Instructed on freq HEP and use of ICE.      Follow Up Recommendations  Home health PT     Equipment Recommendations       Recommendations for Other Services       Precautions / Restrictions Precautions Precautions: Fall;Knee Precaution Comments: Pt unable to perform active SLR so applied KI Required Braces or Orthoses: Knee Immobilizer - Left Knee Immobilizer - Left: Discontinue once straight leg raise with < 10 degree lag Restrictions Weight Bearing Restrictions: No Other Position/Activity Restrictions: WBAT LLE    Mobility  Bed Mobility               General bed mobility comments: Pt OOB in recliner  Transfers Overall transfer level: Needs assistance Equipment used: Rolling walker (2 wheeled) Transfers: Sit to/from Stand Sit to Stand: Supervision         General transfer comment: good safety cognition  Ambulation/Gait Ambulation/Gait assistance: Supervision Ambulation Distance (Feet): 125 Feet Assistive device: Rolling walker (2 wheeled) Gait Pattern/deviations: Step-to pattern Gait velocity: decr   General Gait Details: good safety cognition   Stairs Stairs: Yes Stairs assistance: Min guard Stair Management: No rails;Step to pattern;Forwards;With walker Number of Stairs: 2 General stair comments: 25% VC's on proper tech and safety.  Also instructed pt to wear KI for increased support.  Wheelchair Mobility    Modified Rankin (Stroke Patients Only)       Balance                                    Cognition                            Exercises   Total Knee Replacement TE's 10 reps B LE ankle pumps 10 reps towel squeezes 10 reps knee presses 10 reps heel slides  10 reps SAQ's 10 reps SLR's 10 reps ABD Followed by ICE    General Comments        Pertinent Vitals/Pain C/o 5/10  meds given ICE applied    Home Living                      Prior Function            PT Goals (current goals can now be found in the care plan section) Progress towards PT goals: Progressing toward goals    Frequency  7X/week    PT Plan      Co-evaluation             End of Session Equipment Utilized During Treatment: Gait belt;Left knee immobilizer Activity Tolerance: Patient tolerated treatment well Patient left: in chair;with call bell/phone within reach     Time: 0917-0958 PT Time Calculation (min): 41 min  Charges:  $Gait Training: 8-22 mins $Therapeutic Exercise: 8-22 mins $Therapeutic Activity: 8-22 mins  G Codes:      Rica Koyanagi  PTA [facility]  Acute  Rehab Pager      [phone]

## 2013-08-04 NOTE — Discharge Summary (Signed)
Physician Discharge Summary   Patient ID: [name] White MRN: [id number] DOB/AGE: 05/21/1935 78 y.o.  Admit date: 08/02/2013 Discharge date: 08-04-2013  Primary Diagnosis:  Osteoarthritis Left knee(s)  Admission Diagnoses:  Past Medical History  Diagnosis Date  . Sleep apnea   . Hypothyroidism   . GERD (gastroesophageal reflux disease)   . Osteoarthritis   . Glucose intolerance (impaired glucose tolerance)   . Micturition syncope   . Hypertension   . Heart murmur     "slight"  . Atrial flutter     s/p ablation 07/17/08; echo EF 60-65% trivival AI  . Bradycardia   . H/O hiatal hernia   . Anxiety   . Dysrhythmia     a-fib.   . Coronary artery disease     "slight"  . Complication of anesthesia     "during ablation-heard people talking and he was groaning"  . Hepatitis     h/o of type A   Discharge Diagnoses:   Principal Problem:   OA (osteoarthritis) of knee Active Problems:   HYPOTHYROIDISM   HYPERLIPIDEMIA   HYPERTENSION   GERD   SLEEP APNEA   Coronary atherosclerosis of native coronary artery   Postoperative anemia due to acute blood loss  Estimated body mass index is 30.5 kg/(m^2) as calculated from the following:   Height as of this encounter: $RemoveBeforeD'6\' 3"'[id number]$  (1.905 m).   Weight as of this encounter: 110.679 kg (244 lb 0.1 oz).  Procedure:  Procedure(s) (LRB): LEFT TOTAL KNEE ARTHROPLASTY (Left)   Consults: None  HPI: [name] White is a 78 y.o. year old male with end stage OA of his left knee with progressively worsening pain and dysfunction. He has constant pain, with activity and at rest and significant functional deficits with difficulties even with ADLs. He has had extensive non-op management including analgesics, injections of cortisone and viscosupplements, and home exercise program, but remains in significant pain with significant dysfunction. Radiographs show bone on bone arthritis medial and aptellofemoral. He presents now for left Total Knee Arthroplasty.    Laboratory Data: Admission on 08/02/2013  Component Date Value Ref Range Status  . Color, Urine 08/02/2013 YELLOW  YELLOW Final  . APPearance 08/02/2013 CLEAR  CLEAR Final  . Specific Gravity, Urine 08/02/2013 1.022  1.005 - 1.030 Final  . pH 08/02/2013 5.5  5.0 - 8.0 Final  . Glucose, UA 08/02/2013 NEGATIVE  NEGATIVE mg/dL Final  . Hgb urine dipstick 08/02/2013 MODERATE* NEGATIVE Final  . Bilirubin Urine 08/02/2013 NEGATIVE  NEGATIVE Final  . Ketones, ur 08/02/2013 NEGATIVE  NEGATIVE mg/dL Final  . Protein, ur 08/02/2013 NEGATIVE  NEGATIVE mg/dL Final  . Urobilinogen, UA 08/02/2013 0.2  0.0 - 1.0 mg/dL Final  . Nitrite 08/02/2013 NEGATIVE  NEGATIVE Final  . Leukocytes, UA 08/02/2013 NEGATIVE  NEGATIVE Final  . Squamous Epithelial / LPF 08/02/2013 RARE  RARE Final  . RBC / HPF 08/02/2013 0-2  <3 RBC/hpf Final  . Urine-Other 08/02/2013 MUCOUS PRESENT   Final  . WBC 08/03/2013 10.6* 4.0 - 10.5 K/uL Final  . RBC 08/03/2013 3.51* 4.22 - 5.81 MIL/uL Final  . Hemoglobin 08/03/2013 10.1* 13.0 - 17.0 g/dL Final  . HCT 08/03/2013 30.3* 39.0 - 52.0 % Final  . MCV 08/03/2013 86.3  78.0 - 100.0 fL Final  . MCH 08/03/2013 28.8  26.0 - 34.0 pg Final  . MCHC 08/03/2013 33.3  30.0 - 36.0 g/dL Final  . RDW 08/03/2013 15.0  11.5 - 15.5 % Final  . Platelets 08/03/2013 154  150 - 400 K/uL Final  . Sodium 08/03/2013 140  137 - 147 mEq/L Final  . Potassium 08/03/2013 4.1  3.7 - 5.3 mEq/L Final  . Chloride 08/03/2013 104  96 - 112 mEq/L Final  . CO2 08/03/2013 26  19 - 32 mEq/L Final  . Glucose, Bld 08/03/2013 130* 70 - 99 mg/dL Final  . BUN 16/12/9602 12  6 - 23 mg/dL Final  . Creatinine, Ser 08/03/2013 0.73  0.50 - 1.35 mg/dL Final  . Calcium 54/11/8117 8.7  8.4 - 10.5 mg/dL Final  . GFR calc non Af Amer 08/03/2013 88* >90 mL/min Final  . GFR calc Af Amer 08/03/2013 >90  >90 mL/min Final   Comment: (NOTE)                          The eGFR has been calculated using the CKD EPI equation.                           This calculation has not been validated in all clinical situations.                          eGFR's persistently <90 mL/min signify possible Chronic Kidney                          Disease.  . WBC 08/04/2013 12.3* 4.0 - 10.5 K/uL Final  . RBC 08/04/2013 3.47* 4.22 - 5.81 MIL/uL Final  . Hemoglobin 08/04/2013 10.0* 13.0 - 17.0 g/dL Final  . HCT 14/78/2956 30.2* 39.0 - 52.0 % Final  . MCV 08/04/2013 87.0  78.0 - 100.0 fL Final  . MCH 08/04/2013 28.8  26.0 - 34.0 pg Final  . MCHC 08/04/2013 33.1  30.0 - 36.0 g/dL Final  . RDW 21/30/8657 15.5  11.5 - 15.5 % Final  . Platelets 08/04/2013 151  150 - 400 K/uL Final  . Sodium 08/04/2013 139  137 - 147 mEq/L Final  . Potassium 08/04/2013 4.1  3.7 - 5.3 mEq/L Final   Comment: MODERATE HEMOLYSIS                          HEMOLYSIS AT THIS LEVEL MAY AFFECT RESULT  . Chloride 08/04/2013 102  96 - 112 mEq/L Final  . CO2 08/04/2013 26  19 - 32 mEq/L Final  . Glucose, Bld 08/04/2013 122* 70 - 99 mg/dL Final  . BUN 84/69/6295 11  6 - 23 mg/dL Final  . Creatinine, Ser 08/04/2013 0.61  0.50 - 1.35 mg/dL Final  . Calcium 28/41/3244 8.8  8.4 - 10.5 mg/dL Final  . GFR calc non Af Amer 08/04/2013 >90  >90 mL/min Final  . GFR calc Af Amer 08/04/2013 >90  >90 mL/min Final   Comment: (NOTE)                          The eGFR has been calculated using the CKD EPI equation.                          This calculation has not been validated in all clinical situations.                          eGFR's persistently <90  mL/min signify possible Chronic Kidney                          Disease.  Hospital Outpatient Visit on 07/26/2013  Component Date Value Ref Range Status  . MRSA, PCR 07/26/2013 NEGATIVE  NEGATIVE Final  . Staphylococcus aureus 07/26/2013 POSITIVE* NEGATIVE Final   Comment:                                 The Xpert SA Assay (FDA                          approved for NASAL specimens                          in patients over 21 years of  age),                          is one component of                          a comprehensive surveillance                          program.  Test performance has                          been validated by [employer] for patients greater                          than or equal to 34 year old.                          It is not intended                          to diagnose infection nor to                          guide or monitor treatment.  [name] aPTT 07/26/2013 31  24 - 37 seconds Final  . WBC 07/26/2013 6.3  4.0 - 10.5 K/uL Final  . RBC 07/26/2013 4.71  4.22 - 5.81 MIL/uL Final  . Hemoglobin 07/26/2013 13.6  13.0 - 17.0 g/dL Final  . HCT 07/26/2013 40.4  39.0 - 52.0 % Final  . MCV 07/26/2013 85.8  78.0 - 100.0 fL Final  . MCH 07/26/2013 28.9  26.0 - 34.0 pg Final  . MCHC 07/26/2013 33.7  30.0 - 36.0 g/dL Final  . RDW 07/26/2013 15.1  11.5 - 15.5 % Final  . Platelets 07/26/2013 180  150 - 400 K/uL Final  . Sodium 07/26/2013 140  137 - 147 mEq/L Final  . Potassium 07/26/2013 4.2  3.7 - 5.3 mEq/L Final  . Chloride 07/26/2013 101  96 - 112 mEq/L Final  . CO2 07/26/2013 28  19 - 32 mEq/L Final  . Glucose, Bld 07/26/2013 107* 70 - 99 mg/dL Final  . BUN 07/26/2013 12  6 -  23 mg/dL Final  . Creatinine, Ser 07/26/2013 0.74  0.50 - 1.35 mg/dL Final  . Calcium 07/26/2013 9.5  8.4 - 10.5 mg/dL Final  . Total Protein 07/26/2013 7.5  6.0 - 8.3 g/dL Final  . Albumin 07/26/2013 4.0  3.5 - 5.2 g/dL Final  . AST 07/26/2013 36  0 - 37 U/L Final  . ALT 07/26/2013 29  0 - 53 U/L Final  . Alkaline Phosphatase 07/26/2013 56  39 - 117 U/L Final  . Total Bilirubin 07/26/2013 0.3  0.3 - 1.2 mg/dL Final  . GFR calc non Af Amer 07/26/2013 87* >90 mL/min Final  . GFR calc Af Amer 07/26/2013 >90  >90 mL/min Final   Comment: (NOTE)                          The eGFR has been calculated using the CKD EPI equation.                          This calculation has not been validated in all  clinical situations.                          eGFR's persistently <90 mL/min signify possible Chronic Kidney                          Disease.  [name] Prothrombin Time 07/26/2013 13.1  11.6 - 15.2 seconds Final  . INR 07/26/2013 1.01  0.00 - 1.49 Final  . Color, Urine 07/26/2013 YELLOW  YELLOW Final  . APPearance 07/26/2013 CLEAR  CLEAR Final  . Specific Gravity, Urine 07/26/2013 1.018  1.005 - 1.030 Final  . pH 07/26/2013 5.5  5.0 - 8.0 Final  . Glucose, UA 07/26/2013 NEGATIVE  NEGATIVE mg/dL Final  . Hgb urine dipstick 07/26/2013 MODERATE* NEGATIVE Final  . Bilirubin Urine 07/26/2013 NEGATIVE  NEGATIVE Final  . Ketones, ur 07/26/2013 NEGATIVE  NEGATIVE mg/dL Final  . Protein, ur 07/26/2013 NEGATIVE  NEGATIVE mg/dL Final  . Urobilinogen, UA 07/26/2013 0.2  0.0 - 1.0 mg/dL Final  . Nitrite 07/26/2013 NEGATIVE  NEGATIVE Final  . Leukocytes, UA 07/26/2013 NEGATIVE  NEGATIVE Final  . ABO/RH(D) 07/26/2013 A POS   Final  . Antibody Screen 07/26/2013 NEG   Final  . Sample Expiration 07/26/2013 08/05/2013   Final  . WBC, UA 07/26/2013 0-2  <3 WBC/hpf Final  . RBC / HPF 07/26/2013 3-6  <3 RBC/hpf Final  . Casts 07/26/2013 GRANULAR CAST* NEGATIVE Final  . ABO/RH(D) 07/26/2013 A POS   Final     X-Rays:Dg Chest 2 View  07/26/2013   CLINICAL DATA:  Preop left total knee  EXAM: CHEST  2 VIEW  COMPARISON:  None  FINDINGS: The heart size and mediastinal contours are within normal limits. Both lungs are clear. The visualized skeletal structures are unremarkable.  IMPRESSION: No active cardiopulmonary disease.   Electronically Signed   By: Kerby Moors M.D.   On: 07/26/2013 11:46    EKG: Orders placed during the hospital encounter of 03/15/13  . EKG 12-LEAD  . EKG 12-LEAD     Hospital Course: [name] White is a 78 y.o. who was admitted to [facility]. They were brought to the operating room on 08/02/2013 and underwent Procedure(s): LEFT TOTAL KNEE ARTHROPLASTY.  Patient tolerated the  procedure well and was later transferred to the recovery room  and then to the orthopaedic floor for postoperative care.  They were given PO and IV analgesics for pain control following their surgery.  They were given 24 hours of postoperative antibiotics of  Anti-infectives   Start     Dose/Rate Route Frequency Ordered Stop   08/02/13 1300  ceFAZolin (ANCEF) IVPB 2 g/50 mL premix     2 g 100 mL/hr over 30 Minutes Intravenous Every 6 hours 08/02/13 1012 08/02/13 1901   08/02/13 0513  ceFAZolin (ANCEF) IVPB 2 g/50 mL premix     2 g 100 mL/hr over 30 Minutes Intravenous On call to O.R. 08/02/13 8588 08/02/13 0715     and started on DVT prophylaxis in the form of Xarelto.   PT and OT were ordered for total joint protocol.  Discharge planning consulted to help with postop disposition and equipment needs.  Patient had a good night on the evening of surgery.  They started to get up OOB with therapy on day one. Hemovac drain was pulled without difficulty.  Continued to work with therapy into day two.  Dressing was changed on day two and the incision was healing well.  Patient was seen in rounds and was ready to go home later that day.   Discharge Medications: Prior to Admission medications   Medication Sig Start Date End Date Taking? Authorizing Provider  amLODipine (NORVASC) 10 MG tablet Take 10 mg by mouth every morning.    Yes Historical Provider, MD  benazepril (LOTENSIN) 40 MG tablet Take 40 mg by mouth every morning.    Yes Historical Provider, MD  cetirizine (ZYRTEC) 10 MG tablet Take 10 mg by mouth every evening.   Yes Historical Provider, MD  cloNIDine (CATAPRES) 0.3 MG tablet Take 0.15 mg by mouth 2 (two) times daily.    Yes Historical Provider, MD  dofetilide (TIKOSYN) 500 MCG capsule Take 1 capsule (500 mcg total) by mouth every 12 (twelve) hours. 09/30/12  Yes Jolaine Artist, MD  finasteride (PROSCAR) 5 MG tablet Take 5 mg by mouth daily.    Yes Historical Provider, MD  levothyroxine  (SYNTHROID) 137 MCG tablet Take 137 mcg by mouth every morning.    Yes Historical Provider, MD  naproxen sodium (ALEVE) 220 MG tablet Take 220 mg by mouth 2 (two) times daily as needed (Pain).   Yes Historical Provider, MD  omeprazole (PRILOSEC) 20 MG capsule Take 20 mg by mouth daily.    Yes Historical Provider, MD  PARoxetine (PAXIL) 40 MG tablet Take 40 mg by mouth every morning.    Yes Historical Provider, MD  apixaban (ELIQUIS) 5 MG TABS tablet Take 5 mg by mouth 2 (two) times daily.    Historical Provider, MD  co-enzyme Q-10 30 MG capsule Take 30 mg by mouth every morning.     Historical Provider, MD  fluticasone (FLONASE) 50 MCG/ACT nasal spray Place 2 sprays into the nose daily. 06/04/12   Timoteo Gaul, FNP  methocarbamol (ROBAXIN) 500 MG tablet Take 1 tablet (500 mg total) by mouth every 6 (six) hours as needed for muscle spasms. 08/04/13   Shaquanta Harkless Dara Lords, PA-C  Multiple Vitamin (MULTIVITAMIN WITH MINERALS) TABS tablet Take 1 tablet by mouth daily.    Historical Provider, MD  oxyCODONE (OXY IR/ROXICODONE) 5 MG immediate release tablet Take 1-2 tablets (5-10 mg total) by mouth every 3 (three) hours as needed for moderate pain, severe pain or breakthrough pain. 08/04/13   Tamsyn Owusu Dara Lords, PA-C    Discharge home with home health  Diet - Cardiac diet  Follow up - in 2 weeks  Activity - WBAT  Disposition - Home  Condition Upon Discharge - Good  D/C Meds - See DC Summary  DVT Prophylaxis - Xarelto    Discharge Orders   Future Appointments Provider Department Dept Phone   09/13/2013 8:15 AM [facility] [place]   09/20/2013 9:00 AM Ricard Dillon, MD [facility] [place] [facility]   Future Orders Complete By Expires   Call MD / Call 911  As directed    Change dressing  As directed    Constipation Prevention  As directed    Diet - low sodium heart healthy  As directed    Discharge instructions  As directed    Do not put a  pillow under the knee. Place it under the heel.  As directed    Do not sit on low chairs, stoools or toilet seats, as it may be difficult to get up from low surfaces  As directed    Driving restrictions  As directed    Increase activity slowly as tolerated  As directed    Lifting restrictions  As directed    Patient may shower  As directed    TED hose  As directed    Weight bearing as tolerated  As directed    Questions:     Laterality:     Extremity:         Medication List    STOP taking these medications       ALEVE 220 MG tablet  Generic drug:  naproxen sodium     co-enzyme Q-10 30 MG capsule     multivitamin with minerals Tabs tablet      TAKE these medications       amLODipine 10 MG tablet  Commonly known as:  NORVASC  Take 10 mg by mouth every morning.     benazepril 40 MG tablet  Commonly known as:  LOTENSIN  Take 40 mg by mouth every morning.     cetirizine 10 MG tablet  Commonly known as:  ZYRTEC  Take 10 mg by mouth every evening.     cloNIDine 0.3 MG tablet  Commonly known as:  CATAPRES  Take 0.15 mg by mouth 2 (two) times daily.     dofetilide 500 MCG capsule  Commonly known as:  TIKOSYN  Take 1 capsule (500 mcg total) by mouth every 12 (twelve) hours.     ELIQUIS 5 MG Tabs tablet  Generic drug:  apixaban  Take 5 mg by mouth 2 (two) times daily.  Resume Eliquis starting on Thursday 08/05/2013.     finasteride 5 MG tablet  Commonly known as:  PROSCAR  Take 5 mg by mouth daily.     fluticasone 50 MCG/ACT nasal spray  Commonly known as:  FLONASE  Place 2 sprays into the nose daily.     methocarbamol 500 MG tablet  Commonly known as:  ROBAXIN  Take 1 tablet (500 mg total) by mouth every 6 (six) hours as needed for muscle spasms.     omeprazole 20 MG capsule  Commonly known as:  PRILOSEC  Take 20 mg by mouth daily.     oxyCODONE 5 MG immediate release tablet  Commonly known as:  Oxy IR/ROXICODONE  Take 1-2 tablets (5-10 mg total) by mouth  every 3 (three) hours as needed for moderate pain, severe pain or breakthrough pain.     PARoxetine 40 MG tablet  Commonly known as:  PAXIL  Take 40 mg by mouth every morning.     SYNTHROID 137 MCG tablet  Generic drug:  levothyroxine  Take 137 mcg by mouth every morning.           Follow-up Information   Follow up with Gearlean Alf, MD. Schedule an appointment as soon as possible for a visit on 08/17/2013. (Call [phone] tomorrow to make the appointment)    Specialty:  Orthopedic Surgery   Contact information:   [address] [place] [address] [phone]       Follow up with [facility] (home health physical therapy)    Contact information:   [address] [place] [address] [phone]       Follow up with Gearlean Alf, MD. Schedule an appointment as soon as possible for a visit on 08/17/2013.   Specialty:  Orthopedic Surgery   Contact information:   [address] [place] [address] [phone]       Signed: Arlee Muslim, PA-C Orthopaedic Surgery 08/04/2013, 8:34 AM

## 2013-08-04 NOTE — Discharge Instructions (Addendum)

## 2013-08-04 NOTE — Progress Notes (Signed)
   Subjective: 2 Days Post-Op Procedure(s) (LRB): LEFT TOTAL KNEE ARTHROPLASTY (Left) Patient reports pain as mild.   Patient seen in rounds with Dr. Wynelle Link.  Sitting up in the chair and doing well. Patient is well, and has had no acute complaints or problems Patient is ready to go home today.  Objective: Vital signs in last 24 hours: Temp:  [97.7 F (36.5 C)-98.8 F (37.1 C)] 98.2 F (36.8 C) (05/06 0635) Pulse Rate:  [63-81] 81 (05/06 0635) Resp:  [12-18] 18 (05/06 0635) BP: (131-165)/(64-80) 165/80 mmHg (05/06 0635) SpO2:  [91 %-95 %] 95 % (05/06 0635)  Intake/Output from previous day:  Intake/Output Summary (Last 24 hours) at 08/04/13 0820 Last data filed at 08/04/13 0647  Gross per 24 hour  Intake  702.5 ml  Output   2625 ml  Net -1922.5 ml     Labs:  Recent Labs  08/03/13 0450 08/04/13 0515  HGB 10.1* 10.0*    Recent Labs  08/03/13 0450 08/04/13 0515  WBC 10.6* 12.3*  RBC 3.51* 3.47*  HCT 30.3* 30.2*  PLT 154 151    Recent Labs  08/03/13 0450 08/04/13 0515  NA 140 139  K 4.1 4.1  CL 104 102  CO2 26 26  BUN 12 11  CREATININE 0.73 0.61  GLUCOSE 130* 122*  CALCIUM 8.7 8.8   No results found for this basename: LABPT, INR,  in the last 72 hours  EXAM: General - Patient is Alert, Appropriate and Oriented Extremity - Neurovascular intact Sensation intact distally Dorsiflexion/Plantar flexion intact Incision - clean, dry, no drainage, healing Motor Function - intact, moving foot and toes well on exam.   Assessment/Plan: 2 Days Post-Op Procedure(s) (LRB): LEFT TOTAL KNEE ARTHROPLASTY (Left) Procedure(s) (LRB): LEFT TOTAL KNEE ARTHROPLASTY (Left) Past Medical History  Diagnosis Date  . Sleep apnea   . Hypothyroidism   . GERD (gastroesophageal reflux disease)   . Osteoarthritis   . Glucose intolerance (impaired glucose tolerance)   . Micturition syncope   . Hypertension   . Heart murmur     "slight"  . Atrial flutter     s/p  ablation 07/17/08; echo EF 60-65% trivival AI  . Bradycardia   . H/O hiatal hernia   . Anxiety   . Dysrhythmia     a-fib.   . Coronary artery disease     "slight"  . Complication of anesthesia     "during ablation-heard people talking and he was groaning"  . Hepatitis     h/o of type A   Principal Problem:   OA (osteoarthritis) of knee Active Problems:   HYPOTHYROIDISM   HYPERLIPIDEMIA   HYPERTENSION   GERD   SLEEP APNEA   Coronary atherosclerosis of native coronary artery   Postoperative anemia due to acute blood loss  Estimated body mass index is 30.5 kg/(m^2) as calculated from the following:   Height as of this encounter: 6\' 3"  (1.905 m).   Weight as of this encounter: 110.679 kg (244 lb 0.1 oz). Up with therapy Discharge home with home health Diet - Cardiac diet Follow up - in 2 weeks Activity - WBAT Disposition - Home Condition Upon Discharge - Good D/C Meds - See DC Summary DVT Prophylaxis - [place], PA-C Orthopaedic Surgery 08/04/2013, 8:20 AM

## 2013-08-04 NOTE — Progress Notes (Signed)
Discharge summary sent to payer through [facility]  

## 2013-08-04 NOTE — Progress Notes (Signed)
Occupational Therapy Treatment Patient Details Name: LANNY LIPKIN MRN: [id number] DOB: April 30, 1935 Today's Date: 08/04/2013    History of present illness s/p L TKA; PMX; biceps tendon rupture, R TKA 43yrs ago, HTN   OT comments  Pt did well with shower transfer and educated on safety with shower technique, use of 3in1 as shower chair and when to wear KI. Reinforced using ice for helping with swelling and having assist initially especially with showering.   Follow Up Recommendations  No OT follow up;Supervision/Assistance - 24 hour    Equipment Recommendations  None recommended by OT    Recommendations for Other Services      Precautions / Restrictions Precautions Precautions: Fall;Knee Precaution Comments: Instructed pt on KI use for amb Required Braces or Orthoses: Knee Immobilizer - Left Knee Immobilizer - Left: Discontinue once straight leg raise with < 10 degree lag Restrictions Weight Bearing Restrictions: No Other Position/Activity Restrictions: WBAT LLE       Mobility Bed Mobility                  Transfers Overall transfer level: Needs assistance Equipment used: Rolling walker (2 wheeled) Transfers: Sit to/from Stand Sit to Stand: Min guard         General transfer comment: pt did well with hand placement today and LE management.    Balance                                   ADL                           Toilet Transfer: Min guard;RW       Tub/ Shower Transfer: Walk-in shower;Min guard;Rolling walker     General ADL Comments: Discussed use of KI until able to SLR and reinforced with pt how to don/doff and when to wear. Discussed how he will need to wear KI if he wants to shower before he can SLR versus sponge bathing until he can raise his leg. Discussed use of 3in1 as shower seat and the need for having assist with showering initially including steadying walker as he steps in and out of shower. Pt states his son can  help. Briefly reviewed the reacher and how he can use to doff socks and don/doff pants but pt states his wife can help with LB dressing.       Vision                     Perception     Praxis      Cognition   Behavior During Therapy: WFL for tasks assessed/performed Overall Cognitive Status: Within Functional Limits for tasks assessed                       Extremity/Trunk Assessment               Exercises     Shoulder Instructions       General Comments      Pertinent Vitals/ Pain       No complaint of  Home Living                                          Prior Functioning/Environment  Frequency Min 2X/week     Progress Toward Goals  OT Goals(current goals can now be found in the care plan section)  Progress towards OT goals: Progressing toward goals     Plan Discharge plan remains appropriate    Co-evaluation                 End of Session Equipment Utilized During Treatment: Rolling walker;Left knee immobilizer CPM Left Knee CPM Left Knee: Off   Activity Tolerance Patient tolerated treatment well   Patient Left in chair;with call bell/phone within reach   Nurse Communication          Time: 3532-9924 OT Time Calculation (min): 28 min  Charges: OT General Charges $OT Visit: 1 Procedure OT Treatments $Therapeutic Activity: 23-37 mins  [place] [phone] 08/04/2013, 9:16 AM

## 2013-08-04 NOTE — Progress Notes (Signed)
RN reviewed discharge instructions with patient and family. All questions answered. Patient stated understanding of pain management, blood thinner medication (resuming Eliquis on Thursday 5/7), dressing changes, proper positioning, and home health physical therapy.   Paperwork and prescriptions given.   NT rolled patient down wheelchair to private car.

## 2013-08-12 NOTE — Discharge Summary (Signed)
Physician Discharge Summary   Patient ID:  [name] White  MRN: [id number]  DOB/AGE: 1935/10/02 78 y.o.  Admit date: 08/02/2013  Discharge date: 08-04-2013  Primary Diagnosis: Osteoarthritis Left knee(s)  Admission Diagnoses:  Past Medical History   Diagnosis  Date   .  Sleep apnea    .  Hypothyroidism    .  GERD (gastroesophageal reflux disease)    .  Osteoarthritis    .  Glucose intolerance (impaired glucose tolerance)    .  Micturition syncope    .  Hypertension    .  Heart murmur      "slight"   .  Atrial flutter      s/p ablation 07/17/08; echo EF 60-65% trivival AI   .  Bradycardia    .  H/O hiatal hernia    .  Anxiety    .  Dysrhythmia      a-fib.   .  Coronary artery disease      "slight"   .  Complication of anesthesia      "during ablation-heard people talking and he was groaning"   .  Hepatitis      h/o of type A    Discharge Diagnoses:  Principal Problem:  OA (osteoarthritis) of knee  Active Problems:  HYPOTHYROIDISM  HYPERLIPIDEMIA  HYPERTENSION  GERD  SLEEP APNEA  Coronary atherosclerosis of native coronary artery  Postoperative anemia due to acute blood loss   Estimated body mass index is 30.5 kg/(m^2) as calculated from the following:  Height as of this encounter: $RemoveBeforeD'6\' 3"'[id number]$  (1.905 m).  Weight as of this encounter: 110.679 kg (244 lb 0.1 oz).  Procedure:  Procedure(s) (LRB):  LEFT TOTAL KNEE ARTHROPLASTY (Left)  Consults: None  HPI: [name] White is a 78 y.o. year old male with end stage OA of his left knee with progressively worsening pain and dysfunction. He has constant pain, with activity and at rest and significant functional deficits with difficulties even with ADLs. He has had extensive non-op management including analgesics, injections of cortisone and viscosupplements, and home exercise program, but remains in significant pain with significant dysfunction. Radiographs show bone on bone arthritis medial and aptellofemoral. He presents now for left Total  Knee Arthroplasty.  Laboratory Data:  Admission on 08/02/2013   Component  Date  Value  Ref Range  Status   .  Color, Urine  08/02/2013  YELLOW  YELLOW  Final   .  APPearance  08/02/2013  CLEAR  CLEAR  Final   .  Specific Gravity, Urine  08/02/2013  1.022  1.005 - 1.030  Final   .  pH  08/02/2013  5.5  5.0 - 8.0  Final   .  Glucose, UA  08/02/2013  NEGATIVE  NEGATIVE mg/dL  Final   .  Hgb urine dipstick  08/02/2013  MODERATE*  NEGATIVE  Final   .  Bilirubin Urine  08/02/2013  NEGATIVE  NEGATIVE  Final   .  Ketones, ur  08/02/2013  NEGATIVE  NEGATIVE mg/dL  Final   .  Protein, ur  08/02/2013  NEGATIVE  NEGATIVE mg/dL  Final   .  Urobilinogen, UA  08/02/2013  0.2  0.0 - 1.0 mg/dL  Final   .  Nitrite  08/02/2013  NEGATIVE  NEGATIVE  Final   .  Leukocytes, UA  08/02/2013  NEGATIVE  NEGATIVE  Final   .  Squamous Epithelial / LPF  08/02/2013  RARE  RARE  Final   .  RBC / HPF  08/02/2013  0-2  <3 RBC/hpf  Final   .  Urine-Other  08/02/2013  MUCOUS PRESENT   Final   .  WBC  08/03/2013  10.6*  4.0 - 10.5 K/uL  Final   .  RBC  08/03/2013  3.51*  4.22 - 5.81 MIL/uL  Final   .  Hemoglobin  08/03/2013  10.1*  13.0 - 17.0 g/dL  Final   .  HCT  08/03/2013  30.3*  39.0 - 52.0 %  Final   .  MCV  08/03/2013  86.3  78.0 - 100.0 fL  Final   .  MCH  08/03/2013  28.8  26.0 - 34.0 pg  Final   .  MCHC  08/03/2013  33.3  30.0 - 36.0 g/dL  Final   .  RDW  08/03/2013  15.0  11.5 - 15.5 %  Final   .  Platelets  08/03/2013  154  150 - 400 K/uL  Final   .  Sodium  08/03/2013  140  137 - 147 mEq/L  Final   .  Potassium  08/03/2013  4.1  3.7 - 5.3 mEq/L  Final   .  Chloride  08/03/2013  104  96 - 112 mEq/L  Final   .  CO2  08/03/2013  26  19 - 32 mEq/L  Final   .  Glucose, Bld  08/03/2013  130*  70 - 99 mg/dL  Final   .  BUN  08/03/2013  12  6 - 23 mg/dL  Final   .  Creatinine, Ser  08/03/2013  0.73  0.50 - 1.35 mg/dL  Final   .  Calcium  08/03/2013  8.7  8.4 - 10.5 mg/dL  Final   .  GFR calc non Af Amer   08/03/2013  88*  >90 mL/min  Final   .  GFR calc Af Amer  08/03/2013  >90  >90 mL/min  Final    Comment: (NOTE)    The eGFR has been calculated using the CKD EPI equation.    This calculation has not been validated in all clinical situations.    eGFR's persistently <90 mL/min signify possible Chronic Kidney    Disease.   .  WBC  08/04/2013  12.3*  4.0 - 10.5 K/uL  Final   .  RBC  08/04/2013  3.47*  4.22 - 5.81 MIL/uL  Final   .  Hemoglobin  08/04/2013  10.0*  13.0 - 17.0 g/dL  Final   .  HCT  08/04/2013  30.2*  39.0 - 52.0 %  Final   .  MCV  08/04/2013  87.0  78.0 - 100.0 fL  Final   .  MCH  08/04/2013  28.8  26.0 - 34.0 pg  Final   .  MCHC  08/04/2013  33.1  30.0 - 36.0 g/dL  Final   .  RDW  08/04/2013  15.5  11.5 - 15.5 %  Final   .  Platelets  08/04/2013  151  150 - 400 K/uL  Final   .  Sodium  08/04/2013  139  137 - 147 mEq/L  Final   .  Potassium  08/04/2013  4.1  3.7 - 5.3 mEq/L  Final    Comment: MODERATE HEMOLYSIS    HEMOLYSIS AT THIS LEVEL MAY AFFECT RESULT   .  Chloride  08/04/2013  102  96 - 112 mEq/L  Final   .  CO2  08/04/2013  26  19 -  32 mEq/L  Final   .  Glucose, Bld  08/04/2013  122*  70 - 99 mg/dL  Final   .  BUN  08/04/2013  11  6 - 23 mg/dL  Final   .  Creatinine, Ser  08/04/2013  0.61  0.50 - 1.35 mg/dL  Final   .  Calcium  08/04/2013  8.8  8.4 - 10.5 mg/dL  Final   .  GFR calc non Af Amer  08/04/2013  >90  >90 mL/min  Final   .  GFR calc Af Amer  08/04/2013  >90  >90 mL/min  Final    Comment: (NOTE)    The eGFR has been calculated using the CKD EPI equation.    This calculation has not been validated in all clinical situations.    eGFR's persistently <90 mL/min signify possible Chronic Kidney    Disease.   Hospital Outpatient Visit on 07/26/2013   Component  Date  Value  Ref Range  Status   .  MRSA, PCR  07/26/2013  NEGATIVE  NEGATIVE  Final   .  Staphylococcus aureus  07/26/2013  POSITIVE*  NEGATIVE  Final    Comment:    The Xpert SA Assay (FDA     approved for NASAL specimens    in patients over 10 years of age),    is one component of    a comprehensive surveillance    program. Test performance has    been validated by [employer] for patients greater    than or equal to 43 year old.    It is not intended    to diagnose infection nor to    guide or monitor treatment.   [name]  aPTT  07/26/2013  31  24 - 37 seconds  Final   .  WBC  07/26/2013  6.3  4.0 - 10.5 K/uL  Final   .  RBC  07/26/2013  4.71  4.22 - 5.81 MIL/uL  Final   .  Hemoglobin  07/26/2013  13.6  13.0 - 17.0 g/dL  Final   .  HCT  07/26/2013  40.4  39.0 - 52.0 %  Final   .  MCV  07/26/2013  85.8  78.0 - 100.0 fL  Final   .  MCH  07/26/2013  28.9  26.0 - 34.0 pg  Final   .  MCHC  07/26/2013  33.7  30.0 - 36.0 g/dL  Final   .  RDW  07/26/2013  15.1  11.5 - 15.5 %  Final   .  Platelets  07/26/2013  180  150 - 400 K/uL  Final   .  Sodium  07/26/2013  140  137 - 147 mEq/L  Final   .  Potassium  07/26/2013  4.2  3.7 - 5.3 mEq/L  Final   .  Chloride  07/26/2013  101  96 - 112 mEq/L  Final   .  CO2  07/26/2013  28  19 - 32 mEq/L  Final   .  Glucose, Bld  07/26/2013  107*  70 - 99 mg/dL  Final   .  BUN  07/26/2013  12  6 - 23 mg/dL  Final   .  Creatinine, Ser  07/26/2013  0.74  0.50 - 1.35 mg/dL  Final   .  Calcium  07/26/2013  9.5  8.4 - 10.5 mg/dL  Final   .  Total Protein  07/26/2013  7.5  6.0 - 8.3 g/dL  Final   .  Albumin  07/26/2013  4.0  3.5 - 5.2 g/dL  Final   .  AST  07/26/2013  36  0 - 37 U/L  Final   .  ALT  07/26/2013  29  0 - 53 U/L  Final   .  Alkaline Phosphatase  07/26/2013  56  39 - 117 U/L  Final   .  Total Bilirubin  07/26/2013  0.3  0.3 - 1.2 mg/dL  Final   .  GFR calc non Af Amer  07/26/2013  87*  >90 mL/min  Final   .  GFR calc Af Amer  07/26/2013  >90  >90 mL/min  Final    Comment: (NOTE)    The eGFR has been calculated using the CKD EPI equation.    This calculation has not been validated in all clinical situations.    eGFR's persistently <90  mL/min signify possible Chronic Kidney    Disease.   [name]  Prothrombin Time  07/26/2013  13.1  11.6 - 15.2 seconds  Final   .  INR  07/26/2013  1.01  0.00 - 1.49  Final   .  Color, Urine  07/26/2013  YELLOW  YELLOW  Final   .  APPearance  07/26/2013  CLEAR  CLEAR  Final   .  Specific Gravity, Urine  07/26/2013  1.018  1.005 - 1.030  Final   .  pH  07/26/2013  5.5  5.0 - 8.0  Final   .  Glucose, UA  07/26/2013  NEGATIVE  NEGATIVE mg/dL  Final   .  Hgb urine dipstick  07/26/2013  MODERATE*  NEGATIVE  Final   .  Bilirubin Urine  07/26/2013  NEGATIVE  NEGATIVE  Final   .  Ketones, ur  07/26/2013  NEGATIVE  NEGATIVE mg/dL  Final   .  Protein, ur  07/26/2013  NEGATIVE  NEGATIVE mg/dL  Final   .  Urobilinogen, UA  07/26/2013  0.2  0.0 - 1.0 mg/dL  Final   .  Nitrite  07/26/2013  NEGATIVE  NEGATIVE  Final   .  Leukocytes, UA  07/26/2013  NEGATIVE  NEGATIVE  Final   .  ABO/RH(D)  07/26/2013  A POS   Final   .  Antibody Screen  07/26/2013  NEG   Final   .  Sample Expiration  07/26/2013  08/05/2013   Final   .  WBC, UA  07/26/2013  0-2  <3 WBC/hpf  Final   .  RBC / HPF  07/26/2013  3-6  <3 RBC/hpf  Final   .  Casts  07/26/2013  GRANULAR CAST*  NEGATIVE  Final   .  ABO/RH(D)  07/26/2013  A POS   Final    X-Rays:Dg Chest 2 View  07/26/2013 CLINICAL DATA: Preop left total knee EXAM: CHEST 2 VIEW COMPARISON: None FINDINGS: The heart size and mediastinal contours are within normal limits. Both lungs are clear. The visualized skeletal structures are unremarkable. IMPRESSION: No active cardiopulmonary disease. Electronically Signed By: Kerby Moors M.D. On: 07/26/2013 11:46  EKG:  Orders placed during the hospital encounter of 03/15/13   .  EKG 12-LEAD   .  EKG 12-LEAD    Hospital Course: LEOCADIO HEAL is a 78 y.o. who was admitted to [facility]. They were brought to the operating room on 08/02/2013 and underwent Procedure(s):  LEFT TOTAL KNEE ARTHROPLASTY. Patient tolerated the procedure well and  was later transferred to the recovery room  and then to the orthopaedic floor for postoperative care. They were given PO and IV analgesics for pain control following their surgery. They were given 24 hours of postoperative antibiotics of  Anti-infectives    Start    Dose/Rate  Route  Frequency  Ordered  Stop    08/02/13 1300   ceFAZolin (ANCEF) IVPB 2 g/50 mL premix  2 g  100 mL/hr over 30 Minutes  Intravenous  Every 6 hours  08/02/13 1012  08/02/13 1901    08/02/13 0513   ceFAZolin (ANCEF) IVPB 2 g/50 mL premix  2 g  100 mL/hr over 30 Minutes  Intravenous  On call to O.R.  08/02/13 6789  08/02/13 0715     and started on DVT prophylaxis in the form of Xarelto. PT and OT were ordered for total joint protocol. Discharge planning consulted to help with postop disposition and equipment needs. Patient had a good night on the evening of surgery. They started to get up OOB with therapy on day one. Hemovac drain was pulled without difficulty. Continued to work with therapy into day two. Dressing was changed on day two and the incision was healing well. Patient was seen in rounds and was ready to go home later that day.  Discharge Medications:  Prior to Admission medications   Medication  Sig  Start Date  End Date  Taking?  Authorizing Provider   amLODipine (NORVASC) 10 MG tablet  Take 10 mg by mouth every morning.    Yes  Historical Provider, MD   benazepril (LOTENSIN) 40 MG tablet  Take 40 mg by mouth every morning.    Yes  Historical Provider, MD   cetirizine (ZYRTEC) 10 MG tablet  Take 10 mg by mouth every evening.    Yes  Historical Provider, MD   cloNIDine (CATAPRES) 0.3 MG tablet  Take 0.15 mg by mouth 2 (two) times daily.    Yes  Historical Provider, MD   dofetilide (TIKOSYN) 500 MCG capsule  Take 1 capsule (500 mcg total) by mouth every 12 (twelve) hours.  09/30/12   Yes  Jolaine Artist, MD   finasteride (PROSCAR) 5 MG tablet  Take 5 mg by mouth daily.    Yes  Historical Provider, MD     levothyroxine (SYNTHROID) 137 MCG tablet  Take 137 mcg by mouth every morning.    Yes  Historical Provider, MD   naproxen sodium (ALEVE) 220 MG tablet  Take 220 mg by mouth 2 (two) times daily as needed (Pain).    Yes  Historical Provider, MD   omeprazole (PRILOSEC) 20 MG capsule  Take 20 mg by mouth daily.    Yes  Historical Provider, MD   PARoxetine (PAXIL) 40 MG tablet  Take 40 mg by mouth every morning.    Yes  Historical Provider, MD   apixaban (ELIQUIS) 5 MG TABS tablet  Take 5 mg by mouth 2 (two) times daily.     Historical Provider, MD   co-enzyme Q-10 30 MG capsule  Take 30 mg by mouth every morning.     Historical Provider, MD   fluticasone (FLONASE) 50 MCG/ACT nasal spray  Place 2 sprays into the nose daily.  06/04/12    Timoteo Gaul, FNP   methocarbamol (ROBAXIN) 500 MG tablet  Take 1 tablet (500 mg total) by mouth every 6 (six) hours as needed for muscle spasms.  08/04/13    Dalyn Kjos Dara Lords, PA-C   Multiple Vitamin (MULTIVITAMIN WITH MINERALS) TABS tablet  Take  1 tablet by mouth daily.     Historical Provider, MD   oxyCODONE (OXY IR/ROXICODONE) 5 MG immediate release tablet  Take 1-2 tablets (5-10 mg total) by mouth every 3 (three) hours as needed for moderate pain, severe pain or breakthrough pain.  08/04/13    Riel Hirschman Dara Lords, PA-C   Discharge home with home health  Diet - Cardiac diet  Follow up - in 2 weeks  Activity - WBAT  Disposition - Home  Condition Upon Discharge - Good  D/C Meds - See DC Summary  DVT Prophylaxis - Xarelto  Discharge Orders    Future Appointments  Provider  Department  Dept Phone    09/13/2013 8:15 AM  [facility]  [place] [facility] [place]    09/20/2013 9:00 AM  Ricard Dillon, MD  [place] [facility] [place]    Future Orders  Complete By  Expires    Call MD / Call 911  As directed     Change dressing  As directed     Constipation Prevention  As directed     Diet - low sodium heart healthy  As directed      Discharge instructions  As directed     Do not put a pillow under the knee. Place it under the heel.  As directed     Do not sit on low chairs, stoools or toilet seats, as it may be difficult to get up from low surfaces  As directed     Driving restrictions  As directed     Increase activity slowly as tolerated  As directed     Lifting restrictions  As directed     Patient may shower  As directed     TED hose  As directed     Weight bearing as tolerated  As directed     Questions:      Laterality:    Extremity:        Medication List     STOP taking these medications       ALEVE 220 MG tablet    Generic drug: naproxen sodium    co-enzyme Q-10 30 MG capsule    multivitamin with minerals Tabs tablet     TAKE these medications       amLODipine 10 MG tablet    Commonly known as: NORVASC    Take 10 mg by mouth every morning.    benazepril 40 MG tablet    Commonly known as: LOTENSIN    Take 40 mg by mouth every morning.    cetirizine 10 MG tablet    Commonly known as: ZYRTEC    Take 10 mg by mouth every evening.    cloNIDine 0.3 MG tablet    Commonly known as: CATAPRES    Take 0.15 mg by mouth 2 (two) times daily.    dofetilide 500 MCG capsule    Commonly known as: TIKOSYN    Take 1 capsule (500 mcg total) by mouth every 12 (twelve) hours.    ELIQUIS 5 MG Tabs tablet    Generic drug: apixaban    Take 5 mg by mouth 2 (two) times daily. Resume Eliquis starting on Thursday 08/05/2013.    finasteride 5 MG tablet    Commonly known as: PROSCAR    Take 5 mg by mouth daily.    fluticasone 50 MCG/ACT nasal spray    Commonly known as: FLONASE    Place 2 sprays into the nose daily.  methocarbamol 500 MG tablet    Commonly known as: ROBAXIN    Take 1 tablet (500 mg total) by mouth every 6 (six) hours as needed for muscle spasms.    omeprazole 20 MG capsule    Commonly known as: PRILOSEC    Take 20 mg by mouth daily.    oxyCODONE 5 MG immediate release tablet    Commonly  known as: Oxy IR/ROXICODONE    Take 1-2 tablets (5-10 mg total) by mouth every 3 (three) hours as needed for moderate pain, severe pain or breakthrough pain.    PARoxetine 40 MG tablet    Commonly known as: PAXIL    Take 40 mg by mouth every morning.    SYNTHROID 137 MCG tablet    Generic drug: levothyroxine    Take 137 mcg by mouth every morning.          Follow-up Information    Follow up with Gearlean Alf, MD. Schedule an appointment as soon as possible for a visit on 08/17/2013. (Call [phone] tomorrow to make the appointment)    Specialty: Orthopedic Surgery    Contact information:    [address]  [place] [address]  [phone]       Follow up with [facility]. (home health physical therapy)    Contact information:    [address]  [place] [address]  [phone]       Follow up with Gearlean Alf, MD. Schedule an appointment as soon as possible for a visit on 08/17/2013.    Specialty: Orthopedic Surgery    Contact information:    [address]  [place] [address]  [phone]      Signed:  Arlee Muslim, PA-C  Orthopaedic Surgery  08/04/2013, 8:34 AM

## 2013-08-25 ENCOUNTER — Ambulatory Visit: Payer: Medicare Other | Admitting: Physical Therapy

## 2013-08-25 DIAGNOSIS — Z96659 Presence of unspecified artificial knee joint: Secondary | ICD-10-CM | POA: Insufficient documentation

## 2013-08-25 DIAGNOSIS — R609 Edema, unspecified: Secondary | ICD-10-CM | POA: Insufficient documentation

## 2013-08-25 DIAGNOSIS — Z471 Aftercare following joint replacement surgery: Secondary | ICD-10-CM | POA: Insufficient documentation

## 2013-08-25 DIAGNOSIS — M25669 Stiffness of unspecified knee, not elsewhere classified: Secondary | ICD-10-CM | POA: Insufficient documentation

## 2013-08-25 DIAGNOSIS — IMO0001 Reserved for inherently not codable concepts without codable children: Secondary | ICD-10-CM | POA: Insufficient documentation

## 2013-08-25 DIAGNOSIS — R262 Difficulty in walking, not elsewhere classified: Secondary | ICD-10-CM | POA: Insufficient documentation

## 2013-08-25 DIAGNOSIS — M25569 Pain in unspecified knee: Secondary | ICD-10-CM | POA: Insufficient documentation

## 2013-08-26 ENCOUNTER — Emergency Department (HOSPITAL_BASED_OUTPATIENT_CLINIC_OR_DEPARTMENT_OTHER)
Admission: EM | Admit: 2013-08-26 | Discharge: 2013-08-26 | Disposition: A | Discharge status: Home / Self Care | Payer: Medicare Other | Attending: Emergency Medicine | Admitting: Emergency Medicine

## 2013-08-26 ENCOUNTER — Encounter (HOSPITAL_BASED_OUTPATIENT_CLINIC_OR_DEPARTMENT_OTHER): Payer: Self-pay | Admitting: Emergency Medicine

## 2013-08-26 DIAGNOSIS — T44905A Adverse effect of unspecified drugs primarily affecting the autonomic nervous system, initial encounter: Secondary | ICD-10-CM | POA: Insufficient documentation

## 2013-08-26 DIAGNOSIS — K219 Gastro-esophageal reflux disease without esophagitis: Secondary | ICD-10-CM | POA: Insufficient documentation

## 2013-08-26 DIAGNOSIS — Z79899 Other long term (current) drug therapy: Secondary | ICD-10-CM | POA: Insufficient documentation

## 2013-08-26 DIAGNOSIS — Z87891 Personal history of nicotine dependence: Secondary | ICD-10-CM | POA: Insufficient documentation

## 2013-08-26 DIAGNOSIS — Z7902 Long term (current) use of antithrombotics/antiplatelets: Secondary | ICD-10-CM | POA: Insufficient documentation

## 2013-08-26 DIAGNOSIS — R011 Cardiac murmur, unspecified: Secondary | ICD-10-CM | POA: Insufficient documentation

## 2013-08-26 DIAGNOSIS — I2510 Atherosclerotic heart disease of native coronary artery without angina pectoris: Secondary | ICD-10-CM | POA: Insufficient documentation

## 2013-08-26 DIAGNOSIS — T464X5A Adverse effect of angiotensin-converting-enzyme inhibitors, initial encounter: Secondary | ICD-10-CM

## 2013-08-26 DIAGNOSIS — M1990 Unspecified osteoarthritis, unspecified site: Secondary | ICD-10-CM | POA: Insufficient documentation

## 2013-08-26 DIAGNOSIS — T783XXA Angioneurotic edema, initial encounter: Secondary | ICD-10-CM | POA: Insufficient documentation

## 2013-08-26 DIAGNOSIS — I10 Essential (primary) hypertension: Secondary | ICD-10-CM | POA: Insufficient documentation

## 2013-08-26 DIAGNOSIS — I4892 Unspecified atrial flutter: Secondary | ICD-10-CM | POA: Insufficient documentation

## 2013-08-26 DIAGNOSIS — Z9889 Other specified postprocedural states: Secondary | ICD-10-CM | POA: Insufficient documentation

## 2013-08-26 DIAGNOSIS — F411 Generalized anxiety disorder: Secondary | ICD-10-CM | POA: Insufficient documentation

## 2013-08-26 DIAGNOSIS — E039 Hypothyroidism, unspecified: Secondary | ICD-10-CM | POA: Insufficient documentation

## 2013-08-26 MED ORDER — METHYLPREDNISOLONE SODIUM SUCC 125 MG IJ SOLR
125.0000 mg | Freq: Once | INTRAMUSCULAR | Status: AC
  Administered 2013-08-26: 125 mg via INTRAVENOUS
  Filled 2013-08-26: qty 2

## 2013-08-26 MED ORDER — FAMOTIDINE IN NACL 20-0.9 MG/50ML-% IV SOLN
20.0000 mg | Freq: Once | INTRAVENOUS | Status: AC
  Administered 2013-08-26: 20 mg via INTRAVENOUS
  Filled 2013-08-26: qty 50

## 2013-08-26 MED ORDER — PREDNISONE 20 MG PO TABS: ORAL_TABLET | ORAL | Status: DC

## 2013-08-26 MED ORDER — DIPHENHYDRAMINE HCL 50 MG/ML IJ SOLN
25.0000 mg | Freq: Once | INTRAMUSCULAR | Status: AC
  Administered 2013-08-26: 25 mg via INTRAVENOUS
  Filled 2013-08-26: qty 1

## 2013-08-26 MED ORDER — FAMOTIDINE 20 MG PO TABS: 20.0000 mg | ORAL_TABLET | Freq: Two times a day (BID) | ORAL | Status: DC

## 2013-08-26 NOTE — ED Notes (Signed)
Swelling of lips and face that started after supper.  Took 2 benadryl approx 1930.  Swelling has gotten worse.  Airway clear at this time.

## 2013-08-26 NOTE — Discharge Instructions (Signed)
Drug Allergy A drug allergy means you have a strange reaction to a medicine. You may have puffiness (swelling), itching, red rashes, and hives. Some allergic reactions can be life-threatening. HOME CARE  If you do not know what caused your reaction:  Write down medicines you use.  Write down any problems you have after using medicine.  Avoid things that cause a reaction.  You can see an allergy doctor to be tested for allergies. If you have hives or a rash:  Take medicine as told by your doctor.  Place cold cloths on your skin.  Do not take hot baths or hot showers. Take baths in cool water. If you are severely allergic:  Wear a medical bracelet or necklace that lists your allergy.  Carry your allergy kit or medicine shot to treat severe allergic reactions with you. These can save your life.  Do not drive until medicine from your shot has worn off, unless your doctor says it is okay. GET HELP RIGHT AWAY IF:   Your mouth is puffy, or you have trouble breathing.  You have a tight feeling in your chest or throat.  You have hives, puffiness, or itching all over your body.  You throw up (vomit) or have watery poop (diarrhea).  You feel dizzy or pass out (faint).  You think you are having a reaction. Problems often start within 30 minutes after taking a medicine.  You are getting worse, not better.  You have new problems.  Your problems go away and then come back. This is an emergency. Use your medicine shot or allergy kit as told. Call yourlocal emergency services (911 in [place] after the shot. Even if you feel better after the shot, you need to go to the hospital. You may need more medicine to control a severe reaction. MAKE SURE YOU:  Understand these instructions.  Will watch your condition.  Will get help right away if you are not doing well or get worse. Document Released: 04/25/2004 Document Revised: 06/10/2011 Document Reviewed: 09/13/2010 [facility] Patient  Information 2014 [place].  Angioedema Angioedema is sudden puffiness (swelling), often of the skin. It can happen:  On your face or privates (genitals).  In your belly (abdomen) or other body parts. It usually happens quickly and gets better in 1 or 2 days. It often starts at night and is found when you wake up. You may get red, itchy patches of skin (hives). Attacks can be dangerous if your breathing passages get puffy. The condition may happen only once, or it can come back at random times. It may happen for several years before it goes away for good. HOME CARE  Only take medicines as told by your doctor.  Always carry your emergency allergy medicines with you.  Wear a medical bracelet as told by your doctor.  Avoid things that you know will cause attacks (triggers). GET HELP IF:  You have another attack.  Your attacks happen more often or get worse.  The condition was passed to you by your parents and you want to have children. GET HELP RIGHT AWAY IF:   Your mouth, tongue, or lips are very puffy.  You have trouble breathing.  You have trouble swallowing.  You pass out (faint). MAKE SURE YOU:   Understand these instructions.  Will watch your condition.  Will get help right away if you are not doing well or get worse. Document Released: 03/06/2009 Document Revised: 01/06/2013 Document Reviewed: 11/09/2012 [facility] Patient Information 2014 [place], [place].

## 2013-08-26 NOTE — ED Notes (Addendum)
Increased facial swelling after taking meds  Around 2000 last pm  Started on rt side of face and gradually got worse   Denies diff breathing  No diff talking

## 2013-08-26 NOTE — ED Notes (Signed)
Pt states feels as if swelling is going down somewhat

## 2013-08-26 NOTE — ED Provider Notes (Addendum)
CSN: [id number]     Arrival date & time 08/26/13  0144 History   First MD Initiated Contact with Patient 08/26/13 [phone]     Chief Complaint  Patient presents with  . Allergic Reaction     (Consider location/radiation/quality/duration/timing/severity/associated sxs/prior Treatment) Patient is a 78 y.o. male presenting with allergic reaction. The history is provided by the patient.  Allergic Reaction Presenting symptoms: swelling   Presenting symptoms: no difficulty swallowing   Severity:  Moderate Prior episodes: Ace inhibitor. Context: medications   Relieved by:  Nothing Worsened by:  Nothing tried Ineffective treatments:  Antihistamines .  Past Medical History  Diagnosis Date  . Sleep apnea   . Hypothyroidism   . GERD (gastroesophageal reflux disease)   . Osteoarthritis   . Glucose intolerance (impaired glucose tolerance)   . Micturition syncope   . Hypertension   . Heart murmur     "slight"  . Atrial flutter     s/p ablation 07/17/08; echo EF 60-65% trivival AI  . Bradycardia   . H/O hiatal hernia   . Anxiety   . Dysrhythmia     a-fib.   . Coronary artery disease     "slight"  . Complication of anesthesia     "during ablation-heard people talking and he was groaning"  . Hepatitis     h/o of type A   Past Surgical History  Procedure Laterality Date  . Tonsillectomy  1940's  . Total knee arthroplasty  1990's    right  . Cataract extraction w/ intraocular lens  implant, bilateral  ~ 2007  . Cardiac electrophysiology mapping and ablation  06/2008  . Cardiac catheterization    . Colonoscopy    . Foot tendon surgery Left   . Total knee arthroplasty Left 08/02/2013    Procedure: LEFT TOTAL KNEE ARTHROPLASTY;  Surgeon: Gearlean Alf, MD;  Location: [facility];  Service: Orthopedics;  Laterality: Left;   Family History  Problem Relation Age of Onset  . Multiple sclerosis Other   . Heart failure Other   . Hypertension Other   . Heart failure Father    History   Substance Use Topics  . Smoking status: Former Smoker -- 0 years    Types: Pipe    Quit date: 04/01/1996  . Smokeless tobacco: Former Systems developer    Types: [place] date: 04/01/2009  . Alcohol Use: Yes     Comment: red wine x2 daily    Review of Systems  HENT: Negative for trouble swallowing.   All other systems reviewed and are negative.     Allergies  Codeine; Statins; Piroxicam; and Pseudoephedrine  Home Medications   Prior to Admission medications   Medication Sig Start Date End Date Taking? Authorizing Provider  amLODipine (NORVASC) 10 MG tablet Take 10 mg by mouth every morning.     Historical Provider, MD  apixaban (ELIQUIS) 5 MG TABS tablet Take 5 mg by mouth 2 (two) times daily.    Historical Provider, MD  benazepril (LOTENSIN) 40 MG tablet Take 40 mg by mouth every morning.     Historical Provider, MD  cetirizine (ZYRTEC) 10 MG tablet Take 10 mg by mouth every evening.    Historical Provider, MD  cloNIDine (CATAPRES) 0.3 MG tablet Take 0.15 mg by mouth 2 (two) times daily.     Historical Provider, MD  dofetilide (TIKOSYN) 500 MCG capsule Take 1 capsule (500 mcg total) by mouth every 12 (twelve) hours. 09/30/12   [place],  MD  finasteride (PROSCAR) 5 MG tablet Take 5 mg by mouth daily.     Historical Provider, MD  fluticasone (FLONASE) 50 MCG/ACT nasal spray Place 2 sprays into the nose daily. 06/04/12   Timoteo Gaul, FNP  levothyroxine (SYNTHROID) 137 MCG tablet Take 137 mcg by mouth every morning.     Historical Provider, MD  methocarbamol (ROBAXIN) 500 MG tablet Take 1 tablet (500 mg total) by mouth every 6 (six) hours as needed for muscle spasms. 08/04/13   Arlee Muslim, PA-C  omeprazole (PRILOSEC) 20 MG capsule Take 20 mg by mouth daily.     Historical Provider, MD  oxyCODONE (OXY IR/ROXICODONE) 5 MG immediate release tablet Take 1-2 tablets (5-10 mg total) by mouth every 3 (three) hours as needed for moderate pain, severe pain or breakthrough pain. 08/04/13    Arlee Muslim, PA-C  PARoxetine (PAXIL) 40 MG tablet Take 40 mg by mouth every morning.     Historical Provider, MD   There were no vitals taken for this visit. Physical Exam  Constitutional: He appears well-developed and well-nourished.  HENT:  Head: Atraumatic.  Mouth/Throat: Oropharynx is clear and moist. No oropharyngeal exudate.  No swelling of the tongue or the uvula, swelling of the midface down to jaw including lips  Eyes: Conjunctivae are normal. Pupils are equal, round, and reactive to light.  Neck: Normal range of motion. Neck supple.  Cardiovascular: Normal rate, regular rhythm and intact distal pulses.   Pulmonary/Chest: Effort normal and breath sounds normal. No stridor. No respiratory distress. He has no wheezes. He has no rales.  Abdominal: Soft. Bowel sounds are normal. There is no tenderness. There is no rebound and no guarding.  Musculoskeletal: Normal range of motion.  Neurological: He is alert.  Skin: Skin is warm and dry. He is not diaphoretic. No erythema.  Psychiatric: He has a normal mood and affect.    ED Course  Procedures (including critical care time) Labs Review Labs Reviewed - No data to display  Imaging Review No results found.   EKG Interpretation None      MDM   Final diagnoses:  None    Significant improvement in swelling.  Patient feels improved.  Will d/c with benadryl Q 6 hrs x 5 days Pepcid BID and prednisone.  Return immediately for worsening swelling of the face tongue shortness of breath or any concerns.  Call your doctor and tell them you had to stop the benazapril and will need to start a different class of medications.     Carlisle Beers, MD 08/26/13 0346  Jesiel Garate K Mahira Petras-Rasch, MD 08/26/13 [phone]

## 2013-08-27 ENCOUNTER — Telehealth (HOSPITAL_COMMUNITY): Payer: Self-pay | Admitting: *Deleted

## 2013-08-27 NOTE — Telephone Encounter (Signed)
LATE ENTRY, pt called 5/28 to let us know he was in the ER 5/27 pm for angioedema and was advised to stop taking his Benazepril, he would like to know if he should take something else in its place, discussed with Dr Haroldine Laws he would like for pt to monitor BP for now and let us know if it is elevated, pt is aware and agreeable

## 2013-08-30 ENCOUNTER — Ambulatory Visit: Payer: Medicare Other | Attending: Orthopedic Surgery | Admitting: Physical Therapy

## 2013-08-30 DIAGNOSIS — IMO0001 Reserved for inherently not codable concepts without codable children: Secondary | ICD-10-CM | POA: Insufficient documentation

## 2013-08-30 DIAGNOSIS — M25569 Pain in unspecified knee: Secondary | ICD-10-CM | POA: Insufficient documentation

## 2013-08-30 DIAGNOSIS — M25669 Stiffness of unspecified knee, not elsewhere classified: Secondary | ICD-10-CM | POA: Insufficient documentation

## 2013-08-30 DIAGNOSIS — Z96659 Presence of unspecified artificial knee joint: Secondary | ICD-10-CM | POA: Insufficient documentation

## 2013-08-31 ENCOUNTER — Telehealth (HOSPITAL_COMMUNITY): Payer: Self-pay | Admitting: *Deleted

## 2013-08-31 MED ORDER — CLONIDINE HCL 0.3 MG PO TABS: 0.3000 mg | ORAL_TABLET | Freq: Three times a day (TID) | ORAL | Status: DC

## 2013-08-31 NOTE — Telephone Encounter (Signed)
Pt called concerned about BP readings 5/30 9am 118/60 5/30 1pm 155/83  5/30 4 pm 136/68 5/31 6am 167/93 5/31 9am 133/74 5/31 2:30pm 140/70 6/1 9am 138/75 6/1 3pm 131/76 6/2 5am 180/90 6/2 11am 142/76  Pt states he is taking Amlodipine 10 mg daily and Clonidine 0.3 mg Twice daily, he denies dizziness or HA and states the 1st readings for the day are as soon as he gets up, before he takes his meds.  Reviewed with Dr Haroldine Laws, he would like pt to increase Clonidine to Three times a day and take the 3rd dose just before bed, pt is aware and agreeable, he will continue to monitor and c/b as needed

## 2013-09-02 ENCOUNTER — Ambulatory Visit: Payer: Medicare Other | Admitting: Physical Therapy

## 2013-09-06 ENCOUNTER — Ambulatory Visit: Payer: Medicare Other | Admitting: Physical Therapy

## 2013-09-09 ENCOUNTER — Ambulatory Visit: Payer: Medicare Other | Admitting: Physical Therapy

## 2013-09-13 ENCOUNTER — Ambulatory Visit: Payer: Medicare Other | Admitting: Physical Therapy

## 2013-09-13 ENCOUNTER — Other Ambulatory Visit (INDEPENDENT_AMBULATORY_CARE_PROVIDER_SITE_OTHER): Payer: Medicare Other

## 2013-09-13 DIAGNOSIS — E039 Hypothyroidism, unspecified: Secondary | ICD-10-CM

## 2013-09-13 DIAGNOSIS — Z125 Encounter for screening for malignant neoplasm of prostate: Secondary | ICD-10-CM

## 2013-09-13 DIAGNOSIS — R7303 Prediabetes: Secondary | ICD-10-CM

## 2013-09-13 DIAGNOSIS — Z0000 Encounter for general adult medical examination without abnormal findings: Secondary | ICD-10-CM

## 2013-09-13 DIAGNOSIS — R7309 Other abnormal glucose: Secondary | ICD-10-CM

## 2013-09-13 DIAGNOSIS — E785 Hyperlipidemia, unspecified: Secondary | ICD-10-CM

## 2013-09-13 DIAGNOSIS — I10 Essential (primary) hypertension: Secondary | ICD-10-CM

## 2013-09-13 LAB — CBC WITH DIFFERENTIAL/PLATELET
BASOS ABS: 0 10*3/uL (ref 0.0–0.1)
Basophils Relative: 0.6 % (ref 0.0–3.0)
EOS ABS: 0.2 10*3/uL (ref 0.0–0.7)
Eosinophils Relative: 3.4 % (ref 0.0–5.0)
HEMATOCRIT: 35 % — AB (ref 39.0–52.0)
HEMOGLOBIN: 11.3 g/dL — AB (ref 13.0–17.0)
LYMPHS ABS: 1.6 10*3/uL (ref 0.7–4.0)
Lymphocytes Relative: 27 % (ref 12.0–46.0)
MCHC: 32.3 g/dL (ref 30.0–36.0)
MCV: 85.2 fl (ref 78.0–100.0)
Monocytes Absolute: 0.4 10*3/uL (ref 0.1–1.0)
Monocytes Relative: 7.4 % (ref 3.0–12.0)
Neutro Abs: 3.7 10*3/uL (ref 1.4–7.7)
Neutrophils Relative %: 61.6 % (ref 43.0–77.0)
Platelets: 215 10*3/uL (ref 150.0–400.0)
RBC: 4.11 Mil/uL — ABNORMAL LOW (ref 4.22–5.81)
RDW: 15.4 % (ref 11.5–15.5)
WBC: 6.1 10*3/uL (ref 4.0–10.5)

## 2013-09-13 LAB — HEPATIC FUNCTION PANEL
ALBUMIN: 3.9 g/dL (ref 3.5–5.2)
ALT: 18 U/L (ref 0–53)
AST: 26 U/L (ref 0–37)
Alkaline Phosphatase: 62 U/L (ref 39–117)
Bilirubin, Direct: 0.2 mg/dL (ref 0.0–0.3)
Total Bilirubin: 0.6 mg/dL (ref 0.2–1.2)
Total Protein: 6.6 g/dL (ref 6.0–8.3)

## 2013-09-13 LAB — POCT URINALYSIS DIPSTICK
GLUCOSE UA: NEGATIVE
Leukocytes, UA: NEGATIVE
Nitrite, UA: NEGATIVE
Spec Grav, UA: 1.025
UROBILINOGEN UA: 0.2
pH, UA: 6

## 2013-09-13 LAB — LIPID PANEL
CHOLESTEROL: 191 mg/dL (ref 0–200)
HDL: 74.2 mg/dL (ref >39.00)
LDL CALC: 103 mg/dL — AB (ref 0–99)
NonHDL: 116.8
Total CHOL/HDL Ratio: 3
Triglycerides: 67 mg/dL (ref 0.0–149.0)
VLDL: 13.4 mg/dL (ref 0.0–40.0)

## 2013-09-13 LAB — BASIC METABOLIC PANEL
BUN: 12 mg/dL (ref 6–23)
CO2: 27 mEq/L (ref 19–32)
Calcium: 9.2 mg/dL (ref 8.4–10.5)
Chloride: 105 mEq/L (ref 96–112)
Creatinine, Ser: 0.9 mg/dL (ref 0.4–1.5)
GFR: 82.87 mL/min (ref >60.00)
GLUCOSE: 134 mg/dL — AB (ref 70–99)
POTASSIUM: 4.8 meq/L (ref 3.5–5.1)
SODIUM: 139 meq/L (ref 135–145)

## 2013-09-13 LAB — HEMOGLOBIN A1C: HEMOGLOBIN A1C: 6.1 % (ref 4.6–6.5)

## 2013-09-13 LAB — TSH: TSH: 2.75 u[IU]/mL (ref 0.35–4.50)

## 2013-09-13 LAB — PSA: PSA: 1.42 ng/mL (ref 0.10–4.00)

## 2013-09-14 ENCOUNTER — Telehealth: Payer: Self-pay | Admitting: Internal Medicine

## 2013-09-14 MED ORDER — PAROXETINE HCL 40 MG PO TABS: 40.0000 mg | ORAL_TABLET | ORAL | Status: DC

## 2013-09-14 NOTE — Telephone Encounter (Signed)
Pt had labs done 6/16, however not sure who he wants to trans for his pcp.[name] White His cpe had to be cancelled for Monday due to schedule.  Would like to know about labs and view on mychart after they are released. Thank you!

## 2013-09-14 NOTE — Telephone Encounter (Signed)
rx sent in electronically 

## 2013-09-14 NOTE — Telephone Encounter (Signed)
Pt req rx on PARoxetine (PAXIL) 40 MG tablet   Pharmacy  [facility]

## 2013-09-16 ENCOUNTER — Ambulatory Visit: Payer: Medicare Other | Admitting: Physical Therapy

## 2013-09-20 ENCOUNTER — Ambulatory Visit: Payer: Medicare Other | Admitting: Physical Therapy

## 2013-09-20 ENCOUNTER — Encounter: Payer: Medicare Other | Admitting: Internal Medicine

## 2013-09-23 ENCOUNTER — Ambulatory Visit: Payer: Medicare Other | Admitting: Physical Therapy

## 2013-09-28 NOTE — Telephone Encounter (Signed)
Pt has scheduled an appt w/ dr blythe.

## 2013-09-28 NOTE — Telephone Encounter (Signed)
Per Dr Arnoldo Morale transfer to Dr. Yong Channel

## 2013-10-14 ENCOUNTER — Encounter: Payer: Self-pay | Admitting: Family Medicine

## 2013-10-14 ENCOUNTER — Ambulatory Visit (INDEPENDENT_AMBULATORY_CARE_PROVIDER_SITE_OTHER): Payer: Medicare Other | Admitting: Family Medicine

## 2013-10-14 ENCOUNTER — Telehealth: Payer: Self-pay | Admitting: Internal Medicine

## 2013-10-14 VITALS — BP 130/82 | HR 67 | Temp 98.4°F | Ht 75.0 in | Wt 248.5 lb

## 2013-10-14 DIAGNOSIS — T783XXA Angioneurotic edema, initial encounter: Secondary | ICD-10-CM

## 2013-10-14 MED ORDER — PREDNISONE 20 MG PO TABS: 40.0000 mg | ORAL_TABLET | Freq: Every day | ORAL | Status: DC

## 2013-10-14 NOTE — Telephone Encounter (Signed)
Close encounter 

## 2013-10-14 NOTE — Progress Notes (Signed)
Pre visit review using our clinic review tool, if applicable. No additional management support is needed unless otherwise documented below in the visit note. 

## 2013-10-14 NOTE — Patient Instructions (Addendum)
-  take zyrtec daily  -short prednisone course  -We placed a referral for you as discussed to the allergist.   -schedule appointment with Dr. Randel Pigg in next 1-2 months  -seek emergency care (call 911) immediately if swelling involving tongue/throat/mouth, trouble breathing or swallowing, serious reaction, swelling elsewhere, worsening reaction or concerns

## 2013-10-14 NOTE — Progress Notes (Signed)
No chief complaint on file.   HPI:   Acute visit for:  1)intermittent facial edema: -for several months - has occurred 5-6 times - mouth, tongue in the past -dx as angioedema and acei stopped -this episode started last night only in R chin (not in tongue or mouth or throat this time - getting better today and almost resolved after antihistamine) -he can't figure out the trigger -has not seen allergist -denies: swelling elsewhere, hives, SOB, throat swelling, nausea, vomiting, etc -in process of transferring PCP to Dr. Charlett Blake  ROS: See pertinent positives and negatives per HPI.  Past Medical History  Diagnosis Date  . Sleep apnea   . Hypothyroidism   . GERD (gastroesophageal reflux disease)   . Osteoarthritis   . Glucose intolerance (impaired glucose tolerance)   . Micturition syncope   . Hypertension   . Heart murmur     "slight"  . Atrial flutter     s/p ablation 07/17/08; echo EF 60-65% trivival AI  . Bradycardia   . H/O hiatal hernia   . Anxiety   . Dysrhythmia     a-fib.   . Coronary artery disease     "slight"  . Complication of anesthesia     "during ablation-heard people talking and he was groaning"  . Hepatitis     h/o of type A    Past Surgical History  Procedure Laterality Date  . Tonsillectomy  1940's  . Total knee arthroplasty  1990's    right  . Cataract extraction w/ intraocular lens  implant, bilateral  ~ 2007  . Cardiac electrophysiology mapping and ablation  06/2008  . Cardiac catheterization    . Colonoscopy    . Foot tendon surgery Left   . Total knee arthroplasty Left 08/02/2013    Procedure: LEFT TOTAL KNEE ARTHROPLASTY;  Surgeon: Gearlean Alf, MD;  Location: [facility];  Service: Orthopedics;  Laterality: Left;    Family History  Problem Relation Age of Onset  . Multiple sclerosis Other   . Heart failure Other   . Hypertension Other   . Heart failure Father     History   Social History  . Marital Status: Married    Spouse Name:  N/A    Number of Children: N/A  . Years of Education: N/A   Social History Main Topics  . Smoking status: Former Smoker -- 0 years    Types: Pipe    Quit date: 04/01/1996  . Smokeless tobacco: Former Systems developer    Types: [place] date: 04/01/2009  . Alcohol Use: Yes     Comment: red wine x2 daily  . Drug Use: No  . Sexual Activity: Not Currently   Other Topics Concern  . None   Social History Narrative  . None    Current outpatient prescriptions:amLODipine (NORVASC) 10 MG tablet, Take 10 mg by mouth every morning. , Disp: , Rfl: ;  apixaban (ELIQUIS) 5 MG TABS tablet, Take 5 mg by mouth 2 (two) times daily., Disp: , Rfl: ;  cetirizine (ZYRTEC) 10 MG tablet, Take 10 mg by mouth every evening., Disp: , Rfl: ;  cloNIDine (CATAPRES) 0.3 MG tablet, Take 1 tablet (0.3 mg total) by mouth 3 (three) times daily., Disp: 90 tablet, Rfl: 3 Coenzyme Q10 (COQ10 PO), Take by mouth., Disp: , Rfl: ;  dofetilide (TIKOSYN) 500 MCG capsule, Take 1 capsule (500 mcg total) by mouth every 12 (twelve) hours., Disp: 60 capsule, Rfl: 6;  finasteride (PROSCAR) 5 MG  tablet, Take 5 mg by mouth daily. , Disp: , Rfl: ;  fluticasone (FLONASE) 50 MCG/ACT nasal spray, Place 2 sprays into the nose daily., Disp: 16 g, Rfl: 6 levothyroxine (SYNTHROID) 137 MCG tablet, Take 137 mcg by mouth every morning. , Disp: , Rfl: ;  Multiple Vitamins-Minerals (CENTRUM ADULTS PO), Take by mouth., Disp: , Rfl: ;  omeprazole (PRILOSEC) 20 MG capsule, Take 20 mg by mouth daily. , Disp: , Rfl: ;  PARoxetine (PAXIL) 40 MG tablet, Take 1 tablet (40 mg total) by mouth every morning., Disp: 90 tablet, Rfl: 1 predniSONE (DELTASONE) 20 MG tablet, Take 2 tablets (40 mg total) by mouth daily with breakfast., Disp: 8 tablet, Rfl: 0  EXAM:  Filed Vitals:   10/14/13 1353  BP: 130/82  Pulse: 67  Temp: 98.4 F (36.9 C)    Body mass index is 31.06 kg/(m^2).  GENERAL: vitals reviewed and listed above, alert, oriented, appears well hydrated and in  no acute distress  HEENT: atraumatic, conjunttiva clear, no obvious abnormalities on inspection of external nose and ears, mild swelling of R lower cheek, no tongue swelling  NECK: no obvious masses on inspection  LUNGS: clear to auscultation bilaterally, no wheezes, rales or rhonchi, good air movement  CV: HRRR, no peripheral edema  MS: moves all extremities without noticeable abnormality  PSYCH: pleasant and cooperative, no obvious depression or anxiety  ASSESSMENT AND PLAN:  Discussed the following assessment and plan:  Angioedema, initial encounter - Plan: predniSONE (DELTASONE) 20 MG tablet, Ambulatory referral to Allergy  -we discussed possible serious and likely etiologies, workup and treatment, treatment risks and return precautions -after this discussion, [name] White opted for prednisone, antihistamine since improving, referral to allergist, emergency room discussions and  -of course, we advised [name] White  to return or notify a doctor immediately if symptoms worsen or persist or new concerns arise.   -Patient advised to return or notify a doctor immediately if symptoms worsen or persist or new concerns arise.  Patient Instructions  -take zyrtec daily  -short prednisone course  -We placed a referral for you as discussed to the allergist.   -schedule appointment with Dr. Randel White in next 1-2 months  -seek emergency care (call 911) immediately if swelling involving tongue/throat/mouth, trouble breathing or swallowing, serious reaction, swelling elsewhere, worsening reaction or concerns     [name], [name]

## 2013-10-14 NOTE — Telephone Encounter (Signed)
Patient Information:  Caller Name: [place]  Phone: [phone]  Patient: [name] White, [name] White  Gender: Male  DOB: September 07, 1935  Age: 78 Years  PCP: Penni Homans [facility])  Office Follow Up:  Does the office need to follow up with this patient?: Yes  Instructions For The Office: 4th or 5th episode since Knee surgery in May 2015 facial edema, lower facial cheek size of golf ball and lips slightly swollen. He denies breathing or swallowing issues, was seen in the ER the first time and taken off ACE inhibitors, no medication replaced and VSS today.  RN Note:  Afebrile. Onset 10/14/2013 in the middle of the night: facial swelling: right side of face, size of golf ball (lower cheek next to chin), lips are slightly swollen and denies pain or any dental issues. This is the fourth or fifth time this has happened since his Knee surgery in May 2015. Wife wondering if this is from the surgery. RN/CAN advised to call surgery regarding knee implant. One of the worse episodes, he did go to the ER, and Dr. had taken him off his Ace Inhibitor, was not replaced with any medications. RN/CAN requested he check BP now, 124/58. pulse 89. RN/CAN made appointment in the [place] office, with Dr. Maudie Mercury @ 13:45 today, 10/14/2013, wife will take him there.  Symptoms  Reason For Call & Symptoms: 08/02/2013 knee surgery and since then intermittent episodes of facial swelling/tongue and ER said it was the BP medications  Reviewed Health History In EMR: Yes  Reviewed Medications In EMR: Yes  Reviewed Allergies In EMR: Yes  Reviewed Surgeries / Procedures: Yes  Date of Onset of Symptoms: 10/14/2013  Treatments Tried: 50mg  Benadryl  Treatments Tried Worked: No  Guideline(s) Used:  Face Swelling  Disposition Per Guideline:   See Today in Office  Reason For Disposition Reached:   Patient wants to be seen  Advice Given:  N/A  Patient Will Follow Care Advice:  YES  Appointment Scheduled:  10/14/2013  13:45:00 Appointment Scheduled Provider:  Other

## 2013-11-23 ENCOUNTER — Other Ambulatory Visit (HOSPITAL_COMMUNITY): Payer: Self-pay | Admitting: Anesthesiology

## 2013-11-23 DIAGNOSIS — I6529 Occlusion and stenosis of unspecified carotid artery: Secondary | ICD-10-CM

## 2014-01-06 ENCOUNTER — Other Ambulatory Visit (HOSPITAL_COMMUNITY): Payer: Self-pay | Admitting: *Deleted

## 2014-01-06 DIAGNOSIS — I6523 Occlusion and stenosis of bilateral carotid arteries: Secondary | ICD-10-CM

## 2014-01-25 ENCOUNTER — Ambulatory Visit (HOSPITAL_COMMUNITY): Payer: Medicare Other | Attending: Internal Medicine | Admitting: *Deleted

## 2014-01-25 DIAGNOSIS — E119 Type 2 diabetes mellitus without complications: Secondary | ICD-10-CM | POA: Diagnosis not present

## 2014-01-25 DIAGNOSIS — I10 Essential (primary) hypertension: Secondary | ICD-10-CM | POA: Insufficient documentation

## 2014-01-25 DIAGNOSIS — R0989 Other specified symptoms and signs involving the circulatory and respiratory systems: Secondary | ICD-10-CM | POA: Diagnosis present

## 2014-01-25 DIAGNOSIS — Z87891 Personal history of nicotine dependence: Secondary | ICD-10-CM | POA: Diagnosis not present

## 2014-01-25 DIAGNOSIS — I6523 Occlusion and stenosis of bilateral carotid arteries: Secondary | ICD-10-CM

## 2014-01-25 DIAGNOSIS — E785 Hyperlipidemia, unspecified: Secondary | ICD-10-CM | POA: Diagnosis not present

## 2014-01-25 NOTE — Progress Notes (Signed)
Carotid Duplex Performed 

## 2014-02-01 ENCOUNTER — Ambulatory Visit (INDEPENDENT_AMBULATORY_CARE_PROVIDER_SITE_OTHER): Payer: Medicare Other | Admitting: Family Medicine

## 2014-02-01 ENCOUNTER — Ambulatory Visit (INDEPENDENT_AMBULATORY_CARE_PROVIDER_SITE_OTHER): Payer: Medicare Other

## 2014-02-01 ENCOUNTER — Encounter: Payer: Self-pay | Admitting: Family Medicine

## 2014-02-01 VITALS — BP 134/76 | HR 58 | Temp 97.7°F | Ht 75.0 in | Wt 257.6 lb

## 2014-02-01 DIAGNOSIS — R739 Hyperglycemia, unspecified: Secondary | ICD-10-CM

## 2014-02-01 DIAGNOSIS — Z23 Encounter for immunization: Secondary | ICD-10-CM

## 2014-02-01 DIAGNOSIS — I10 Essential (primary) hypertension: Secondary | ICD-10-CM

## 2014-02-01 DIAGNOSIS — B059 Measles without complication: Secondary | ICD-10-CM | POA: Insufficient documentation

## 2014-02-01 DIAGNOSIS — B269 Mumps without complication: Secondary | ICD-10-CM | POA: Insufficient documentation

## 2014-02-01 DIAGNOSIS — E119 Type 2 diabetes mellitus without complications: Secondary | ICD-10-CM

## 2014-02-01 DIAGNOSIS — G4733 Obstructive sleep apnea (adult) (pediatric): Secondary | ICD-10-CM

## 2014-02-01 DIAGNOSIS — E785 Hyperlipidemia, unspecified: Secondary | ICD-10-CM

## 2014-02-01 DIAGNOSIS — E669 Obesity, unspecified: Secondary | ICD-10-CM

## 2014-02-01 DIAGNOSIS — L729 Follicular cyst of the skin and subcutaneous tissue, unspecified: Secondary | ICD-10-CM

## 2014-02-01 DIAGNOSIS — F418 Other specified anxiety disorders: Secondary | ICD-10-CM

## 2014-02-01 DIAGNOSIS — S46211S Strain of muscle, fascia and tendon of other parts of biceps, right arm, sequela: Secondary | ICD-10-CM

## 2014-02-01 DIAGNOSIS — E1169 Type 2 diabetes mellitus with other specified complication: Secondary | ICD-10-CM

## 2014-02-01 DIAGNOSIS — E039 Hypothyroidism, unspecified: Secondary | ICD-10-CM

## 2014-02-01 NOTE — Patient Instructions (Signed)

## 2014-02-01 NOTE — Progress Notes (Signed)
Pre visit review using our clinic review tool, if applicable. No additional management support is needed unless otherwise documented below in the visit note. 

## 2014-02-01 NOTE — Progress Notes (Signed)
Patient ID: [name] White, male   DOB: 1936-03-18, 78 y.o.   MRN: [id number] ADRICK KESTLER [id number] May 15, 1935 02/01/2014      Progress Note-Follow Up  Subjective  Chief Complaint  Chief Complaint  Patient presents with  . Establish Care    new pt/ transfer from Dr Arnoldo Morale  . Injections    flu    HPI  Patient is a 78 year old male in today for routine medical care. Patient is in today to establish care secondary to PMD retiring from patient care. Acknowledges poor fitful sleep with arising frequently. Unfortunately does not use his CPAP, denies am  HA but does note dehydration in am. Denies CP/palp/SOB/HA/congestion/fevers/GI or GU c/o. Taking meds as prescribed  Past Medical History  Diagnosis Date  . Sleep apnea   . Hypothyroidism   . GERD (gastroesophageal reflux disease)   . Osteoarthritis   . Glucose intolerance (impaired glucose tolerance)   . Micturition syncope   . Hypertension   . Heart murmur     "slight"  . Atrial flutter     s/p ablation 07/17/08; echo EF 60-65% trivival AI  . Bradycardia   . H/O hiatal hernia   . Anxiety   . Dysrhythmia     a-fib.   . Coronary artery disease     "slight"  . Complication of anesthesia     "during ablation-heard people talking and he was groaning"  . Hepatitis     h/o of type A  . Chicken pox as a child  . Measles as a child  . Mumps as a child  . Emphysema of lung     Past Surgical History  Procedure Laterality Date  . Tonsillectomy  1940's  . Total knee arthroplasty  1990's    right  . Cataract extraction w/ intraocular lens  implant, bilateral  ~ 2007  . Cardiac electrophysiology mapping and ablation  06/2008  . Cardiac catheterization    . Colonoscopy    . Foot tendon surgery Left   . Total knee arthroplasty Left 08/02/2013    Procedure: LEFT TOTAL KNEE ARTHROPLASTY;  Surgeon: Gearlean Alf, MD;  Location: [facility];  Service: Orthopedics;  Laterality: Left;  . Wisdom tooth extraction      Family History   Problem Relation Age of Onset  . Multiple sclerosis Father   . Heart disease Mother   . Hypertension Son     History   Social History  . Marital Status: Married    Spouse Name: N/A    Number of Children: N/A  . Years of Education: N/A   Occupational History  . Not on file.   Social History Main Topics  . Smoking status: Former Smoker -- 0 years    Types: Pipe    Quit date: 04/01/1996  . Smokeless tobacco: Former Systems developer    Types: [place] date: 04/01/2009  . Alcohol Use: 0.0 oz/week    0 Not specified per week     Comment: red wine x2 daily  . Drug Use: No  . Sexual Activity: Not Currently   Other Topics Concern  . Not on file   Social History Narrative    Current Outpatient Prescriptions on File Prior to Visit  Medication Sig Dispense Refill  . amLODipine (NORVASC) 10 MG tablet Take 10 mg by mouth every morning.     [name] apixaban (ELIQUIS) 5 MG TABS tablet Take 5 mg by mouth 2 (two) times daily.    [name]  cloNIDine (CATAPRES) 0.3 MG tablet Take 1 tablet (0.3 mg total) by mouth 3 (three) times daily. 90 tablet 3  . Coenzyme Q10 (COQ10 PO) Take by mouth.    . dofetilide (TIKOSYN) 500 MCG capsule Take 1 capsule (500 mcg total) by mouth every 12 (twelve) hours. 60 capsule 6  . finasteride (PROSCAR) 5 MG tablet Take 5 mg by mouth daily.     [name] levothyroxine (SYNTHROID) 137 MCG tablet Take 137 mcg by mouth every morning.     . Multiple Vitamins-Minerals (CENTRUM ADULTS PO) Take by mouth.    [name] omeprazole (PRILOSEC) 20 MG capsule Take 20 mg by mouth daily.     [name] PARoxetine (PAXIL) 40 MG tablet Take 1 tablet (40 mg total) by mouth every morning. 90 tablet 1   No current facility-administered medications on file prior to visit.    Allergies  Allergen Reactions  . Codeine Itching and Rash  . Statins Other (See Comments)    Made very sick, doctor thought had heart attack; "my appearance, color, etc"  . Ace Inhibitors   . Benazepril   . Piroxicam Hives  . Pseudoephedrine      Can't take because it interacts with dofetilide     Review of Systems  Review of Systems  Constitutional: Negative for fever, chills and malaise/fatigue.  HENT: Negative for congestion, hearing loss and nosebleeds.   Eyes: Negative for discharge.  Respiratory: Negative for cough, sputum production, shortness of breath and wheezing.   Cardiovascular: Negative for chest pain, palpitations and leg swelling.  Gastrointestinal: Negative for heartburn, nausea, vomiting, abdominal pain, diarrhea, constipation and blood in stool.  Genitourinary: Negative for dysuria, urgency, frequency and hematuria.  Musculoskeletal: Negative for myalgias, back pain and falls.  Skin: Negative for rash.  Neurological: Negative for dizziness, tremors, sensory change, focal weakness, loss of consciousness, weakness and headaches.  Endo/Heme/Allergies: Negative for polydipsia. Does not bruise/bleed easily.  Psychiatric/Behavioral: Negative for depression and suicidal ideas. The patient is not nervous/anxious and does not have insomnia.     Objective  BP 141/76 mmHg  Pulse 58  Temp(Src) 97.7 F (36.5 C) (Oral)  Ht 6\' 3"  (1.905 m)  Wt 257 lb 9.6 oz (116.847 kg)  BMI 32.20 kg/m2  SpO2 98%  Physical Exam  Physical Exam  Constitutional: He is oriented to person, place, and time and well-developed, well-nourished, and in no distress. No distress.  HENT:  Head: Normocephalic and atraumatic.  Eyes: Conjunctivae are normal.  Neck: Neck supple. No thyromegaly present.  Cardiovascular: Normal rate, regular rhythm and normal heart sounds.   No murmur heard. Pulmonary/Chest: Effort normal and breath sounds normal. No respiratory distress.  Abdominal: He exhibits no distension and no mass. There is no tenderness.  Musculoskeletal: He exhibits no edema.  Neurological: He is alert and oriented to person, place, and time.  Skin: Skin is warm.  Psychiatric: Memory, affect and judgment normal.    Lab Results   Component Value Date   TSH 2.75 09/13/2013   Lab Results  Component Value Date   WBC 6.1 09/13/2013   HGB 11.3* 09/13/2013   HCT 35.0* 09/13/2013   MCV 85.2 09/13/2013   PLT 215.0 09/13/2013   Lab Results  Component Value Date   CREATININE 0.9 09/13/2013   BUN 12 09/13/2013   NA 139 09/13/2013   K 4.8 09/13/2013   CL 105 09/13/2013   CO2 27 09/13/2013   Lab Results  Component Value Date   ALT 18 09/13/2013   AST 26  09/13/2013   ALKPHOS 62 09/13/2013   BILITOT 0.6 09/13/2013   Lab Results  Component Value Date   CHOL 191 09/13/2013   Lab Results  Component Value Date   HDL 74.20 09/13/2013   Lab Results  Component Value Date   LDLCALC 103* 09/13/2013   Lab Results  Component Value Date   TRIG 67.0 09/13/2013   Lab Results  Component Value Date   CHOLHDL 3 09/13/2013     Assessment & Plan  Depression with anxiety Doing well, he forgot to take it a couple of weeks ago and was increasingly agitated. Now back on it and feeling much better.  Hyperlipidemia Encouraged heart healthy diet, increase exercise, avoid trans fats, consider a krill oil cap daily. Tolerating Tricor  Essential hypertension Well controlled, no changes to meds. Encouraged heart healthy diet such as the DASH diet and exercise as tolerated.   Obstructive sleep apnea Has CPAP but has not been using due to difficulty with congestion, encouraged to restart use or accept referral back to pulmonology for further testing prior to restarting CPAP  Diabetes mellitus type 2 in obese [id number] acceptable, minimize simple carbs. Increase exercise as tolerated. Continue current meds Encouraged DASH diet, decrease po intake and increase exercise as tolerated. Needs 7-8 hours of sleep nightly. Avoid trans fats, eat small, frequent meals every 4-5 hours with lean proteins, complex carbs and healthy fats. Minimize simple carbs, GMO foods.  Hypothyroidism On Levothyroxine, continue to monitor

## 2014-02-01 NOTE — Assessment & Plan Note (Signed)
Doing well, he forgot to take it a couple of weeks ago and was increasingly agitated. Now back on it and feeling much better.

## 2014-02-02 LAB — CBC
HCT: 40.2 % (ref 39.0–52.0)
Hemoglobin: 13 g/dL (ref 13.0–17.0)
MCHC: 32.4 g/dL (ref 30.0–36.0)
MCV: 82.5 fl (ref 78.0–100.0)
PLATELETS: 232 10*3/uL (ref 150.0–400.0)
RBC: 4.87 Mil/uL (ref 4.22–5.81)
RDW: 19.1 % — ABNORMAL HIGH (ref 11.5–15.5)
WBC: 6.4 10*3/uL (ref 4.0–10.5)

## 2014-02-02 LAB — LIPID PANEL
CHOL/HDL RATIO: 3
Cholesterol: 214 mg/dL — ABNORMAL HIGH (ref 0–200)
HDL: 70.7 mg/dL (ref >39.00)
LDL Cholesterol: 116 mg/dL — ABNORMAL HIGH (ref 0–99)
NONHDL: 143.3
Triglycerides: 136 mg/dL (ref 0.0–149.0)
VLDL: 27.2 mg/dL (ref 0.0–40.0)

## 2014-02-02 LAB — RENAL FUNCTION PANEL
Albumin: 4 g/dL (ref 3.5–5.2)
BUN: 15 mg/dL (ref 6–23)
CO2: 26 meq/L (ref 19–32)
CREATININE: 1 mg/dL (ref 0.4–1.5)
Calcium: 9.6 mg/dL (ref 8.4–10.5)
Chloride: 103 mEq/L (ref 96–112)
GFR: 73.67 mL/min (ref >60.00)
Glucose, Bld: 114 mg/dL — ABNORMAL HIGH (ref 70–99)
Phosphorus: 3.7 mg/dL (ref 2.3–4.6)
Potassium: 4.2 mEq/L (ref 3.5–5.1)
SODIUM: 137 meq/L (ref 135–145)

## 2014-02-02 LAB — HEPATIC FUNCTION PANEL
ALK PHOS: 59 U/L (ref 39–117)
ALT: 33 U/L (ref 0–53)
AST: 43 U/L — AB (ref 0–37)
Albumin: 4 g/dL (ref 3.5–5.2)
BILIRUBIN DIRECT: 0.1 mg/dL (ref 0.0–0.3)
TOTAL PROTEIN: 7.8 g/dL (ref 6.0–8.3)
Total Bilirubin: 0.5 mg/dL (ref 0.2–1.2)

## 2014-02-02 LAB — HEMOGLOBIN A1C: HEMOGLOBIN A1C: 6.7 % — AB (ref 4.6–6.5)

## 2014-02-02 LAB — TSH: TSH: 11.73 u[IU]/mL — ABNORMAL HIGH (ref 0.35–4.50)

## 2014-02-03 MED ORDER — LEVOTHYROXINE SODIUM 150 MCG PO TABS: 150.0000 ug | ORAL_TABLET | Freq: Every day | ORAL | Status: DC

## 2014-02-03 MED ORDER — FENOFIBRATE 145 MG PO TABS: 145.0000 mg | ORAL_TABLET | Freq: Every day | ORAL | Status: DC

## 2014-02-06 ENCOUNTER — Encounter: Payer: Self-pay | Admitting: Family Medicine

## 2014-02-06 NOTE — Assessment & Plan Note (Signed)
Has CPAP but has not been using due to difficulty with congestion, encouraged to restart use or accept referral back to pulmonology for further testing prior to restarting CPAP

## 2014-02-06 NOTE — Assessment & Plan Note (Signed)
Encouraged heart healthy diet, increase exercise, avoid trans fats, consider a krill oil cap daily. Tolerating Tricor

## 2014-02-06 NOTE — Assessment & Plan Note (Signed)
hgba1c acceptable, minimize simple carbs. Increase exercise as tolerated. Continue current meds Encouraged DASH diet, decrease po intake and increase exercise as tolerated. Needs 7-8 hours of sleep nightly. Avoid trans fats, eat small, frequent meals every 4-5 hours with lean proteins, complex carbs and healthy fats. Minimize simple carbs, GMO foods.

## 2014-02-06 NOTE — Assessment & Plan Note (Signed)
On Levothyroxine, continue to monitor 

## 2014-02-06 NOTE — Assessment & Plan Note (Signed)
Well controlled, no changes to meds. Encouraged heart healthy diet such as the DASH diet and exercise as tolerated.  °

## 2014-03-17 ENCOUNTER — Ambulatory Visit (INDEPENDENT_AMBULATORY_CARE_PROVIDER_SITE_OTHER): Payer: Medicare Other | Admitting: Pulmonary Disease

## 2014-03-17 ENCOUNTER — Encounter: Payer: Self-pay | Admitting: Pulmonary Disease

## 2014-03-17 VITALS — BP 138/84 | HR 52 | Ht 75.5 in | Wt 258.0 lb

## 2014-03-17 DIAGNOSIS — G4733 Obstructive sleep apnea (adult) (pediatric): Secondary | ICD-10-CM

## 2014-03-17 NOTE — Patient Instructions (Signed)
Please call [phone] & schedule appt for mask fitting We will check report on your CPAP machine in 1 month CPAP titration study will be scheduled

## 2014-03-17 NOTE — Progress Notes (Signed)
Subjective:    Patient ID: [name] White, male    DOB: 1935-06-18, 78 y.o.   MRN: [id number]  HPI  78 year old man presents for management of OSA Polysomnogram in 01/2006 showed severe OSA with AHI of 72 per hour and lowest desaturation of 80%. This was corrected by CPAP of 11 cm with good control of events in supine sleep. He used this for a few years but lately reports problems using his full face mask. He moves his hands in his sleep and is constantly taking the mask off without realizing this he has developed excessive somnolence over the last 6 months. Epworth sleepiness score is 19. Bedtime is around 9 PM, sleep latency is a few minutes, he reports several nocturnal awakenings with nocturia and is out of bed by 7 AM with dryness of mouth and occasional headache. His weight is unchanged for the last few years. He does have atrial fibrillation-that is controlled on Tikosyn and is on anticoagulation. He has tried using nasal pillows with the same results We reviewed his machine settings-grandpas set at 15 minutes   Past Medical History  Diagnosis Date  . Sleep apnea   . Hypothyroidism   . GERD (gastroesophageal reflux disease)   . Osteoarthritis   . Glucose intolerance (impaired glucose tolerance)   . Micturition syncope   . Hypertension   . Heart murmur     "slight"  . Atrial flutter     s/p ablation 07/17/08; echo EF 60-65% trivival AI  . Bradycardia   . H/O hiatal hernia   . Anxiety   . Dysrhythmia     a-fib.   . Coronary artery disease     "slight"  . Complication of anesthesia     "during ablation-heard people talking and he was groaning"  . Hepatitis     h/o of type A  . Chicken pox as a child  . Measles as a child  . Mumps as a child  . Emphysema of lung   . Diabetes mellitus type 2 in obese 04/10/2009    Qualifier: Diagnosis of  By: Arnoldo Morale MD, Balinda Quails    Past Surgical History  Procedure Laterality Date  . Tonsillectomy  1940's  . Total knee arthroplasty   1990's    right  . Cataract extraction w/ intraocular lens  implant, bilateral  ~ 2007  . Cardiac electrophysiology mapping and ablation  06/2008  . Cardiac catheterization    . Colonoscopy    . Foot tendon surgery Left   . Total knee arthroplasty Left 08/02/2013    Procedure: LEFT TOTAL KNEE ARTHROPLASTY;  Surgeon: Gearlean Alf, MD;  Location: [facility];  Service: Orthopedics;  Laterality: Left;  . Wisdom tooth extraction       Review of Systems  Constitutional: Negative for fever and unexpected weight change.  HENT: Negative for congestion, dental problem, ear pain, nosebleeds, postnasal drip, rhinorrhea, sinus pressure, sneezing, sore throat and trouble swallowing.   Eyes: Negative for redness and itching.  Respiratory: Negative for cough, chest tightness, shortness of breath and wheezing.   Cardiovascular: Negative for palpitations and leg swelling.  Gastrointestinal: Negative for nausea and vomiting.  Genitourinary: Negative for dysuria.  Musculoskeletal: Negative for joint swelling.  Skin: Negative for rash.  Neurological: Negative for headaches.  Hematological: Does not bruise/bleed easily.  Psychiatric/Behavioral: Negative for dysphoric mood. The patient is not nervous/anxious.        Objective:   Physical Exam  Gen. Pleasant, well-nourished, in no distress, normal  affect ENT - no lesions, no post nasal drip Neck: No JVD, no thyromegaly, no carotid bruits Lungs: no use of accessory muscles, no dullness to percussion, clear without rales or rhonchi  Cardiovascular: Rhythm regular, heart sounds  normal, no murmurs or gallops, no peripheral edema Abdomen: soft and non-tender, no hepatosplenomegaly, BS normal. Musculoskeletal: No deformities, no cyanosis or clubbing Neuro:  alert, non focal       Assessment & Plan:

## 2014-03-17 NOTE — Assessment & Plan Note (Signed)
Please call [phone] & schedule appt for mask fitting We will check report on your CPAP machine in 1 month CPAP titration study will be scheduled We will try to obtain copy of old study  Weight loss encouraged, compliance with goal of at least 4-6 hrs every night is the expectation. Advised against medications with sedative side effects Cautioned against driving when sleepy - understanding that sleepiness will vary on a day to day basis

## 2014-04-05 ENCOUNTER — Telehealth: Payer: Self-pay | Admitting: Family Medicine

## 2014-04-05 DIAGNOSIS — Z1211 Encounter for screening for malignant neoplasm of colon: Secondary | ICD-10-CM

## 2014-04-05 NOTE — Telephone Encounter (Signed)
Referral placed.

## 2014-04-05 NOTE — Telephone Encounter (Signed)
Caller name:Wine junaita Relation to GY:[id number] Call back [phone] Pharmacy:  Reason for call: pt received a letter stating that it is time for his colonoscopy however pt would like to switch to dr. Henrene Pastor, and would like for dr. Charlett Blake to do a referral for him to go to dr. Henrene Pastor. Pt has [employer]. Pt will go to dr. Earlean Shawl office and pick up his medical records and take them to dr. Henrene Pastor .

## 2014-04-12 ENCOUNTER — Ambulatory Visit (HOSPITAL_BASED_OUTPATIENT_CLINIC_OR_DEPARTMENT_OTHER): Payer: Self-pay | Attending: Pulmonary Disease | Admitting: Radiology

## 2014-04-12 DIAGNOSIS — Z9989 Dependence on other enabling machines and devices: Principal | ICD-10-CM

## 2014-04-12 DIAGNOSIS — G4733 Obstructive sleep apnea (adult) (pediatric): Secondary | ICD-10-CM

## 2014-04-14 ENCOUNTER — Telehealth: Payer: Self-pay

## 2014-04-14 NOTE — Telephone Encounter (Signed)
Dr. Henrene Pastor reviewed pts chart and per Dr. Henrene Pastor pt would not be due for colon until 2020. At that point pt would age out and not need a colon done. Pt aware and knows to call if he has any problems.

## 2014-04-27 ENCOUNTER — Telehealth: Payer: Self-pay | Admitting: Family Medicine

## 2014-04-27 NOTE — Telephone Encounter (Signed)
Caller name: Celene Kras Relation to pt: spouse  Call back number: [phone]   Reason for call:  spouse requesting levothyroxine (SYNTHROID, LEVOTHROID) 150 MCG tablet and cloNIDine (CATAPRES) 0.3 MG tablet please fax to [facility]  90 day supply fax # [phone]

## 2014-04-28 MED ORDER — CLONIDINE HCL 0.3 MG PO TABS: 0.3000 mg | ORAL_TABLET | Freq: Two times a day (BID) | ORAL | Status: DC

## 2014-04-28 MED ORDER — LEVOTHYROXINE SODIUM 150 MCG PO TABS: 150.0000 ug | ORAL_TABLET | Freq: Every day | ORAL | Status: DC

## 2014-04-29 NOTE — Telephone Encounter (Signed)
Rx's faxed to the pharmacy at [facility] [employer].//AB/CMA

## 2014-05-06 ENCOUNTER — Ambulatory Visit (HOSPITAL_BASED_OUTPATIENT_CLINIC_OR_DEPARTMENT_OTHER): Payer: Medicare Other | Attending: Pulmonary Disease

## 2014-05-06 VITALS — Ht 75.0 in | Wt 255.0 lb

## 2014-05-06 DIAGNOSIS — G4733 Obstructive sleep apnea (adult) (pediatric): Secondary | ICD-10-CM | POA: Insufficient documentation

## 2014-05-06 DIAGNOSIS — Z9989 Dependence on other enabling machines and devices: Secondary | ICD-10-CM

## 2014-05-11 DIAGNOSIS — G4730 Sleep apnea, unspecified: Secondary | ICD-10-CM

## 2014-05-11 NOTE — Sleep Study (Signed)
[place]  NAME: [name] White  DATE OF BIRTH: Feb 15, 1936  MEDICAL RECORD NUMBER [id number]  LOCATION: [facility]  PHYSICIAN: Dayelin Balducci V.  DATE OF STUDY: 05/06/14   SLEEP STUDY TYPE: CPAP titration study               REFERRING PHYSICIAN: Rigoberto Noel, MD  INDICATION FOR STUDY: 79 year old man with OSA Polysomnogram in 01/2006 showed severe OSA with AHI of 72 per hour and lowest desaturation of 80%. This was corrected by CPAP of 11 cm with good control of events in supine sleep At the time of this study ,they weighed 255 pounds with a height of  6 ft 3 inches and the BMI of 32, neck size of 17.5 inches. Epworth sleepiness score was 21   This CPAP titration polysomnogram was performed with a sleep technologist in attendance. EEG, EOG,EMG and respiratory parameters recorded. Sleep stages, arousals, limb movements and respiratory data was scored according to criteria laid out by the American Academy of sleep medicine.  SLEEP ARCHITECTURE: Lights out was at 2215 PM and lights on was at 437 AM. Total sleep time was 278 minutes with sleep period time of 370 minutes and sleep efficiency of 73 .Sleep latency was 7 minutes with latency to REM sleep of 158 minutes and wake after sleep onset of 97 minutes.  Sleep stages as a percentage of total sleep time was N1 16 N2- 73 % and REM sleep 11 % ( 30 minutes) . The longest period of REM sleep was around 2 AM.   AROUSAL DATA : There were 55 arousals with an arousal index of 12 events per hour. Of these 19 were spontaneous, and 36 were associated with respiratory events and 0 were associated periodic limb movements  RESPIRATORY DATA: CPAP was initiated at 5 centimeters and titrated to a final level of 17 centimeters due to respiratory events and snoring. At the level of 7 centimeters for 149 minutes, there were 1 obstructive apneas, 4 central apneas, 0 mixed apneas and 5 hypopneas with apnea -hypopnea index of  4 events per hour.  After a bathroom break , several events were noted during supine sleep requiring C Pap titration to 17 cm however not enough sleep was noted at subsequent levels. Titration was optimal.  MOVEMENT/PARASOMNIA: There were 0 PLMS with a PLM index of 0 events per hour. The PLM arousal index was 0 events per hour.  OXYGEN DATA: The lowest desaturation was 85 % during non-REM sleep and the desaturation index was 17 per hour. The saturations stayed below 88% for 0.7 minutes.  CARDIAC DATA: The low heart rate was 31 beats per minute. The high heart rate recorded was an artifact. No arrhythmias were noted   IMPRESSION :  1. Moderate obstructive sleep apnea with hypopneas causing sleep fragmentation and mild oxygen desaturation. 2. This was corrected by CPAP of 17 centimeters with a medium fullface mask. Titration was optimal. He events seemed controlled on 7 cm, but there was some breakthrough during supine sleep 3. No evidence of cardiac arrhythmias or behavioral disturbance during sleep. 4. Periodic limb movements were not noted.  RECOMMENDATION:    1. The treatment options for this degree of sleep disordered breathing includes weight loss and CPAP therapy. CPAP can be initiated at 11 centimeters with a medium fullface mask and compliance monitored at this level. Download can be obtained to confirm adequate pressure, and if necessary, pressure can be increased. 2. Patient should  be cautioned against driving when sleepy 3. They should be asked to avoid medications with sedative side effects  Rigoberto Noel  MD Diplomate, American Board of Sleep Medicine  ELECTRONICALLY SIGNED ON:  05/11/2014  [facility] PH: [phone]   FX: [phone] [place]

## 2014-05-13 ENCOUNTER — Ambulatory Visit (INDEPENDENT_AMBULATORY_CARE_PROVIDER_SITE_OTHER): Payer: Medicare Other | Admitting: Pulmonary Disease

## 2014-05-13 ENCOUNTER — Encounter: Payer: Self-pay | Admitting: Pulmonary Disease

## 2014-05-13 VITALS — BP 142/82 | HR 56 | Ht 70.0 in | Wt 260.0 lb

## 2014-05-13 DIAGNOSIS — G4733 Obstructive sleep apnea (adult) (pediatric): Secondary | ICD-10-CM

## 2014-05-13 NOTE — Progress Notes (Signed)
   Subjective:    Patient ID: Dineen Kid, male    DOB: 1936-03-07, 79 y.o.   MRN: [id number]  HPI  79 year old man for FU  of OSA Polysomnogram in 01/2006 showed severe OSA with AHI of 72 per hour and lowest desaturation of 80%. This was corrected by CPAP of 11 cm with good control of events in supine sleep. He used this for a few years but lately reports problems using his full face mask. He moves his hands in his sleep and is constantly taking the mask off  He does have atrial fibrillation-that is controlled on Tikosyn and is on anticoagulation. He has tried using nasal pillows with the same results   05/13/2014  Chief Complaint  Patient presents with  . Follow-up    problems with mask, having a hard time keeping the mask on.  Patient says that the material on the mask is irriatating his face.    Download 05/2014 >> avg usage 5.5 h ? Pressure 11 cm, ramp 20 mins- we lowered to 10 Fisher paykel mask - he constantly takes this off  Review of Systems neg for any significant sore throat, dysphagia, itching, sneezing, nasal congestion or excess/ purulent secretions, fever, chills, sweats, unintended wt loss, pleuritic or exertional cp, hempoptysis, orthopnea pnd or change in chronic leg swelling. Also denies presyncope, palpitations, heartburn, abdominal pain, nausea, vomiting, diarrhea or change in bowel or urinary habits, dysuria,hematuria, rash, arthralgias, visual complaints, headache, numbness weakness or ataxia.     Objective:   Physical Exam  Gen. Pleasant, well-nourished, in no distress ENT - no lesions, no post nasal drip Neck: No JVD, no thyromegaly, no carotid bruits Lungs: no use of accessory muscles, no dullness to percussion, clear without rales or rhonchi  Cardiovascular: Rhythm regular, heart sounds  normal, no murmurs or gallops, no peripheral edema Musculoskeletal: No deformities, no cyanosis or clubbing        Assessment & Plan:

## 2014-05-13 NOTE — Patient Instructions (Signed)
We will ask [facility] to check your machine pressure & renew supplies

## 2014-05-14 NOTE — Assessment & Plan Note (Signed)
We will ask [facility] to check your machine pressure & renew supplies He may qualify for a new machine eventually  compliance with goal of at least 4-6 hrs every night is the expectation. Advised against medications with sedative side effects Cautioned against driving when sleepy - understanding that sleepiness will vary on a day to day basis

## 2014-05-25 ENCOUNTER — Telehealth: Payer: Self-pay | Admitting: Pulmonary Disease

## 2014-05-25 DIAGNOSIS — G4733 Obstructive sleep apnea (adult) (pediatric): Secondary | ICD-10-CM

## 2014-05-25 NOTE — Telephone Encounter (Signed)
Poor usage Can we help him in anyway?

## 2014-05-26 NOTE — Telephone Encounter (Signed)
Patient says he is having trouble keeping the mask on.  He starts off wearing it at night, but somehow the mask ends up off.  He says that he has a habit of wiping his face at night while he's sleeping and doesn't know what to do.  Would patient be a candidate for dental appliances?  Patient says he is willing to try whatever will work because he is afraid that his problem with Sleep Apnea is causing problems with his A-fib.  Please advise.

## 2014-06-03 ENCOUNTER — Encounter: Payer: Self-pay | Admitting: Family Medicine

## 2014-06-03 ENCOUNTER — Ambulatory Visit (INDEPENDENT_AMBULATORY_CARE_PROVIDER_SITE_OTHER): Payer: Medicare Other | Admitting: Family Medicine

## 2014-06-03 VITALS — BP 148/80 | HR 53 | Temp 98.3°F | Ht 75.0 in | Wt 256.4 lb

## 2014-06-03 DIAGNOSIS — E785 Hyperlipidemia, unspecified: Secondary | ICD-10-CM | POA: Diagnosis not present

## 2014-06-03 DIAGNOSIS — E1169 Type 2 diabetes mellitus with other specified complication: Secondary | ICD-10-CM

## 2014-06-03 DIAGNOSIS — E039 Hypothyroidism, unspecified: Secondary | ICD-10-CM | POA: Diagnosis not present

## 2014-06-03 DIAGNOSIS — E119 Type 2 diabetes mellitus without complications: Secondary | ICD-10-CM

## 2014-06-03 DIAGNOSIS — E669 Obesity, unspecified: Secondary | ICD-10-CM

## 2014-06-03 DIAGNOSIS — E782 Mixed hyperlipidemia: Secondary | ICD-10-CM

## 2014-06-03 DIAGNOSIS — I10 Essential (primary) hypertension: Secondary | ICD-10-CM

## 2014-06-03 LAB — COMPREHENSIVE METABOLIC PANEL
ALT: 23 U/L (ref 0–53)
AST: 32 U/L (ref 0–37)
Albumin: 4.8 g/dL (ref 3.5–5.2)
Alkaline Phosphatase: 49 U/L (ref 39–117)
BUN: 15 mg/dL (ref 6–23)
CO2: 29 meq/L (ref 19–32)
Calcium: 9.8 mg/dL (ref 8.4–10.5)
Chloride: 104 mEq/L (ref 96–112)
Creatinine, Ser: 1.26 mg/dL (ref 0.40–1.50)
GFR: 58.99 mL/min — AB (ref >60.00)
Glucose, Bld: 113 mg/dL — ABNORMAL HIGH (ref 70–99)
Potassium: 4.3 mEq/L (ref 3.5–5.1)
SODIUM: 137 meq/L (ref 135–145)
TOTAL PROTEIN: 8.1 g/dL (ref 6.0–8.3)
Total Bilirubin: 0.5 mg/dL (ref 0.2–1.2)

## 2014-06-03 LAB — HEMOGLOBIN A1C: Hgb A1c MFr Bld: 7 % — ABNORMAL HIGH (ref 4.6–6.5)

## 2014-06-03 LAB — TSH: TSH: 7.36 u[IU]/mL — AB (ref 0.35–4.50)

## 2014-06-03 LAB — LIPID PANEL
CHOL/HDL RATIO: 3
Cholesterol: 187 mg/dL (ref 0–200)
HDL: 68.1 mg/dL (ref >39.00)
LDL CALC: 103 mg/dL — AB (ref 0–99)
NONHDL: 118.9
Triglycerides: 82 mg/dL (ref 0.0–149.0)
VLDL: 16.4 mg/dL (ref 0.0–40.0)

## 2014-06-03 LAB — MICROALBUMIN / CREATININE URINE RATIO
CREATININE, U: 281.3 mg/dL
MICROALB UR: 5.3 mg/dL — AB (ref 0.0–1.9)
Microalb Creat Ratio: 1.9 mg/g (ref 0.0–30.0)

## 2014-06-03 LAB — CBC
HCT: 40.9 % (ref 39.0–52.0)
Hemoglobin: 13.7 g/dL (ref 13.0–17.0)
MCHC: 33.4 g/dL (ref 30.0–36.0)
MCV: 82.4 fl (ref 78.0–100.0)
Platelets: 214 10*3/uL (ref 150.0–400.0)
RBC: 4.97 Mil/uL (ref 4.22–5.81)
RDW: 16.3 % — ABNORMAL HIGH (ref 11.5–15.5)
WBC: 5.6 10*3/uL (ref 4.0–10.5)

## 2014-06-03 MED ORDER — CLONIDINE HCL 0.2 MG PO TABS: 0.1000 mg | ORAL_TABLET | Freq: Every day | ORAL | Status: DC

## 2014-06-03 MED ORDER — RANITIDINE HCL 300 MG PO TABS: 300.0000 mg | ORAL_TABLET | Freq: Every day | ORAL | Status: DC

## 2014-06-03 MED ORDER — CLONIDINE HCL 0.3 MG PO TABS: 0.3000 mg | ORAL_TABLET | Freq: Two times a day (BID) | ORAL | Status: DC

## 2014-06-03 NOTE — Patient Instructions (Addendum)
Try alternating the Ranitidine and Omeprazole every other day and then can switch to every today for Ranitidine  Start a probiotic daily   Rel of rec Dr Achille Rich opthamology

## 2014-06-03 NOTE — Progress Notes (Signed)
Pre visit review using our clinic review tool, if applicable. No additional management support is needed unless otherwise documented below in the visit note. 

## 2014-06-05 ENCOUNTER — Encounter (HOSPITAL_BASED_OUTPATIENT_CLINIC_OR_DEPARTMENT_OTHER): Payer: Medicare Other

## 2014-06-05 ENCOUNTER — Encounter: Payer: Self-pay | Admitting: Family Medicine

## 2014-06-05 NOTE — Progress Notes (Signed)
[name] White  [id number] [birth year] 06/05/2014      Progress Note-Follow Up  Subjective  Chief Complaint  Chief Complaint  Patient presents with  . Follow-up    HPI  Patient is a 79 y.o. male in today for routine medical care. Patient in today for following up questioning if he can switch out his PPI for an H2 blocker. No recent illness or acure concerns. Denies CP/palp/SOB/HA/congestion/fevers/GI or GU c/o. Taking meds as prescribed  Past Medical History  Diagnosis Date  . Sleep apnea   . Hypothyroidism   . GERD (gastroesophageal reflux disease)   . Osteoarthritis   . Glucose intolerance (impaired glucose tolerance)   . Micturition syncope   . Hypertension   . Heart murmur     "slight"  . Atrial flutter     s/p ablation 07/17/08; echo EF 60-65% trivival AI  . Bradycardia   . H/O hiatal hernia   . Anxiety   . Dysrhythmia     a-fib.   . Coronary artery disease     "slight"  . Complication of anesthesia     "during ablation-heard people talking and he was groaning"  . Hepatitis     h/o of type A  . Chicken pox as a child  . Measles as a child  . Mumps as a child  . Emphysema of lung   . Diabetes mellitus type 2 in obese 04/10/2009    Qualifier: Diagnosis of  By: Arnoldo Morale MD, Balinda Quails     Past Surgical History  Procedure Laterality Date  . Tonsillectomy  1940's  . Total knee arthroplasty  1990's    right  . Cataract extraction w/ intraocular lens  implant, bilateral  ~ 2007  . Cardiac electrophysiology mapping and ablation  06/2008  . Cardiac catheterization    . Colonoscopy    . Foot tendon surgery Left   . Total knee arthroplasty Left 08/02/2013    Procedure: LEFT TOTAL KNEE ARTHROPLASTY;  Surgeon: Gearlean Alf, MD;  Location: [facility];  Service: Orthopedics;  Laterality: Left;  . Wisdom tooth extraction      Family History  Problem Relation Age of Onset  . Multiple sclerosis Father   . Heart disease Mother     atrial fibrillation  . Hypertension Son     . Heart disease Maternal Grandfather     History   Social History  . Marital Status: Married    Spouse Name: N/A  . Number of Children: N/A  . Years of Education: N/A   Occupational History  . Not on file.   Social History Main Topics  . Smoking status: Former Smoker -- 0 years    Types: Pipe, Cigars    Quit date: 04/01/1996  . Smokeless tobacco: Former Systems developer    Types: [place] date: 04/01/2009  . Alcohol Use: 0.0 oz/week    0 Standard drinks or equivalent per week     Comment: red wine x2 daily  . Drug Use: No  . Sexual Activity: Not Currently     Comment: lives with wife, eats clean diet. No major dietary restrtictions,    Other Topics Concern  . Not on file   Social History Narrative    Current Outpatient Prescriptions on File Prior to Visit  Medication Sig Dispense Refill  . amLODipine (NORVASC) 10 MG tablet Take 10 mg by mouth every morning.     [name] apixaban (ELIQUIS) 5 MG TABS tablet Take 5 mg by  mouth 2 (two) times daily.    . Coenzyme Q10 (COQ10 PO) Take by mouth.    . dofetilide (TIKOSYN) 500 MCG capsule Take 1 capsule (500 mcg total) by mouth every 12 (twelve) hours. 60 capsule 6  . fenofibrate (TRICOR) 145 MG tablet Take 1 tablet (145 mg total) by mouth daily. 30 tablet 3  . finasteride (PROSCAR) 5 MG tablet Take 5 mg by mouth daily.     [name] levothyroxine (SYNTHROID, LEVOTHROID) 150 MCG tablet Take 1 tablet (150 mcg total) by mouth daily before breakfast. 90 tablet 0  . Multiple Vitamins-Minerals (CENTRUM ADULTS PO) Take by mouth.    [name] omeprazole (PRILOSEC) 20 MG capsule Take 20 mg by mouth daily.     [name] PARoxetine (PAXIL) 40 MG tablet Take 1 tablet (40 mg total) by mouth every morning. 90 tablet 1   No current facility-administered medications on file prior to visit.    Allergies  Allergen Reactions  . Codeine Itching and Rash  . Statins Other (See Comments)    Made very sick, doctor thought had heart attack; "my appearance, color, etc"  . Ace  Inhibitors   . Benazepril   . Piroxicam Hives  . Pseudoephedrine     Can't take because it interacts with dofetilide     Review of Systems  Review of Systems  Constitutional: Negative for fever and malaise/fatigue.  HENT: Negative for congestion.   Eyes: Negative for discharge.  Respiratory: Negative for shortness of breath.   Cardiovascular: Negative for chest pain, palpitations and leg swelling.  Gastrointestinal: Negative for nausea, abdominal pain and diarrhea.  Genitourinary: Negative for dysuria.  Musculoskeletal: Negative for falls.  Skin: Negative for rash.  Neurological: Negative for loss of consciousness and headaches.  Endo/Heme/Allergies: Negative for polydipsia.  Psychiatric/Behavioral: Negative for depression and suicidal ideas. The patient is not nervous/anxious and does not have insomnia.     Objective  BP 148/80 mmHg  Pulse 53  Temp(Src) 98.3 F (36.8 C) (Oral)  Ht 6\' 3"  (1.905 m)  Wt 256 lb 6 oz (116.291 kg)  BMI 32.04 kg/m2  SpO2 97%  Physical Exam  Physical Exam  Constitutional: He is oriented to person, place, and time and well-developed, well-nourished, and in no distress. No distress.  HENT:  Head: Normocephalic and atraumatic.  Eyes: Conjunctivae are normal.  Neck: Neck supple. No thyromegaly present.  Cardiovascular: Normal rate, regular rhythm and normal heart sounds.   No murmur heard. Pulmonary/Chest: Effort normal and breath sounds normal. No respiratory distress.  Abdominal: He exhibits no distension and no mass. There is no tenderness.  Musculoskeletal: He exhibits no edema.  Neurological: He is alert and oriented to person, place, and time.  Skin: Skin is warm.  Psychiatric: Memory, affect and judgment normal.    Lab Results  Component Value Date   TSH 7.36* 06/03/2014   Lab Results  Component Value Date   WBC 5.6 06/03/2014   HGB 13.7 06/03/2014   HCT 40.9 06/03/2014   MCV 82.4 06/03/2014   PLT 214.0 06/03/2014   Lab  Results  Component Value Date   CREATININE 1.26 06/03/2014   BUN 15 06/03/2014   NA 137 06/03/2014   K 4.3 06/03/2014   CL 104 06/03/2014   CO2 29 06/03/2014   Lab Results  Component Value Date   ALT 23 06/03/2014   AST 32 06/03/2014   ALKPHOS 49 06/03/2014   BILITOT 0.5 06/03/2014   Lab Results  Component Value Date   CHOL 187 06/03/2014  Lab Results  Component Value Date   HDL 68.10 06/03/2014   Lab Results  Component Value Date   LDLCALC 103* 06/03/2014   Lab Results  Component Value Date   TRIG 82.0 06/03/2014   Lab Results  Component Value Date   CHOLHDL 3 06/03/2014     Assessment & Plan  Essential hypertension Adequately controlled, will add a 0.1 mg dose of Clonidine midday. Encouraged heart healthy diet such as the DASH diet and exercise as tolerated.    Diabetes mellitus type 2 in obese hgba1c acceptable, minimize simple carbs. Increase exercise as tolerated. Continue current meds   Hypothyroidism On Levothyroxine, continue to monitor   Hyperlipidemia Tolerating statin, encouraged heart healthy diet, avoid trans fats, minimize simple carbs and saturated fats. Increase exercise as tolerated

## 2014-06-05 NOTE — Assessment & Plan Note (Signed)
hgba1c acceptable, minimize simple carbs. Increase exercise as tolerated. Continue current meds 

## 2014-06-05 NOTE — Assessment & Plan Note (Addendum)
Adequately controlled, will add a 0.1 mg dose of Clonidine midday. Encouraged heart healthy diet such as the DASH diet and exercise as tolerated.

## 2014-06-05 NOTE — Assessment & Plan Note (Signed)
Tolerating statin, encouraged heart healthy diet, avoid trans fats, minimize simple carbs and saturated fats. Increase exercise as tolerated 

## 2014-06-05 NOTE — Assessment & Plan Note (Signed)
On Levothyroxine, continue to monitor 

## 2014-06-06 ENCOUNTER — Encounter: Payer: Self-pay | Admitting: Family Medicine

## 2014-06-06 NOTE — Telephone Encounter (Signed)
Patient called in stating that   Amlodipine -[facility] Paroxetine-[facility] Ranitidine-[facility]

## 2014-06-08 NOTE — Telephone Encounter (Signed)
Can trial nasal pillows- if he has not used this already

## 2014-06-09 ENCOUNTER — Encounter: Payer: Self-pay | Admitting: Family Medicine

## 2014-06-09 NOTE — Addendum Note (Signed)
Addended by: Mathis Dad on: 06/09/2014 04:44 PM   Modules accepted: Orders

## 2014-06-09 NOTE — Telephone Encounter (Signed)
Order entered for [employer].  Patient notified.  Nothing further needed.

## 2014-06-09 NOTE — Telephone Encounter (Signed)
Patient has tried nasal pillows, they work great.  However, he is still pulling it off of his face.  He wants to know if they have any chin straps that he can use with his nasal pillows?

## 2014-06-09 NOTE — Telephone Encounter (Signed)
Ok for chin strap

## 2014-06-10 ENCOUNTER — Telehealth: Payer: Self-pay | Admitting: Family Medicine

## 2014-06-10 ENCOUNTER — Other Ambulatory Visit: Payer: Self-pay | Admitting: Family Medicine

## 2014-06-10 MED ORDER — LEVOTHYROXINE SODIUM 150 MCG PO TABS: ORAL_TABLET | ORAL | Status: DC

## 2014-06-10 NOTE — Telephone Encounter (Signed)
Spoke patient see email dated 06/10/14.

## 2014-06-10 NOTE — Telephone Encounter (Signed)
Called the patient left message to call back 

## 2014-06-26 ENCOUNTER — Other Ambulatory Visit: Payer: Self-pay | Admitting: Family Medicine

## 2014-06-28 DIAGNOSIS — G4733 Obstructive sleep apnea (adult) (pediatric): Secondary | ICD-10-CM | POA: Diagnosis not present

## 2014-07-16 ENCOUNTER — Other Ambulatory Visit (HOSPITAL_COMMUNITY): Payer: Self-pay | Admitting: Internal Medicine

## 2014-07-18 ENCOUNTER — Other Ambulatory Visit (HOSPITAL_COMMUNITY): Payer: Self-pay | Admitting: *Deleted

## 2014-07-18 MED ORDER — APIXABAN 5 MG PO TABS: 5.0000 mg | ORAL_TABLET | Freq: Two times a day (BID) | ORAL | Status: DC

## 2014-09-26 ENCOUNTER — Other Ambulatory Visit: Payer: Self-pay

## 2014-09-28 ENCOUNTER — Telehealth (HOSPITAL_COMMUNITY): Payer: Self-pay | Admitting: *Deleted

## 2014-09-28 NOTE — Telephone Encounter (Signed)
Pt states he was seen at the [place] and his HR was in the 40s on EKG it was 48, he denies symptoms and has been bradycardic in the past, pt hasn't been seen since Dec 2014, f/u appt sch for 7/19

## 2014-10-17 ENCOUNTER — Encounter: Payer: Self-pay | Admitting: Family Medicine

## 2014-10-17 ENCOUNTER — Ambulatory Visit (INDEPENDENT_AMBULATORY_CARE_PROVIDER_SITE_OTHER): Payer: Medicare Other | Admitting: Family Medicine

## 2014-10-17 VITALS — BP 133/62 | HR 58 | Temp 98.5°F | Ht 74.5 in | Wt 262.1 lb

## 2014-10-17 DIAGNOSIS — I2510 Atherosclerotic heart disease of native coronary artery without angina pectoris: Secondary | ICD-10-CM

## 2014-10-17 DIAGNOSIS — G4733 Obstructive sleep apnea (adult) (pediatric): Secondary | ICD-10-CM

## 2014-10-17 DIAGNOSIS — I10 Essential (primary) hypertension: Secondary | ICD-10-CM

## 2014-10-17 DIAGNOSIS — E785 Hyperlipidemia, unspecified: Secondary | ICD-10-CM

## 2014-10-17 DIAGNOSIS — K219 Gastro-esophageal reflux disease without esophagitis: Secondary | ICD-10-CM | POA: Diagnosis not present

## 2014-10-17 DIAGNOSIS — E039 Hypothyroidism, unspecified: Secondary | ICD-10-CM

## 2014-10-17 MED ORDER — RANITIDINE HCL 300 MG PO TABS: 300.0000 mg | ORAL_TABLET | Freq: Every day | ORAL | Status: DC

## 2014-10-17 MED ORDER — LEVOTHYROXINE SODIUM 150 MCG PO TABS: ORAL_TABLET | ORAL | Status: DC

## 2014-10-17 NOTE — Progress Notes (Signed)
[name] White  [id number] 07-07-1935 10/17/2014      Progress Note-Follow Up  Subjective  Chief Complaint  Chief Complaint  Patient presents with  . Follow-up    HPI  Patient is a 79 y.o. male in today for routine medical care. Using CPAP daily now that he has a mask that works well. Feels more rested. No recent illness. No acute concerns. Denies CP/palp/SOB/HA/congestion/fevers/GI or GU c/o. Taking meds as prescribed  Past Medical History  Diagnosis Date  . Sleep apnea   . Hypothyroidism   . GERD (gastroesophageal reflux disease)   . Osteoarthritis   . Glucose intolerance (impaired glucose tolerance)   . Micturition syncope   . Hypertension   . Heart murmur     "slight"  . Atrial flutter     s/p ablation 07/17/08; echo EF 60-65% trivival AI  . Bradycardia   . H/O hiatal hernia   . Anxiety   . Dysrhythmia     a-fib.   . Coronary artery disease     "slight"  . Complication of anesthesia     "during ablation-heard people talking and he was groaning"  . Hepatitis     h/o of type A  . Chicken pox as a child  . Measles as a child  . Mumps as a child  . Emphysema of lung   . Diabetes mellitus type 2 in obese 04/10/2009    Qualifier: Diagnosis of  By: Arnoldo Morale MD, Balinda Quails     Past Surgical History  Procedure Laterality Date  . Tonsillectomy  1940's  . Total knee arthroplasty  1990's    right  . Cataract extraction w/ intraocular lens  implant, bilateral  ~ 2007  . Cardiac electrophysiology mapping and ablation  06/2008  . Cardiac catheterization    . Colonoscopy    . Foot tendon surgery Left   . Total knee arthroplasty Left 08/02/2013    Procedure: LEFT TOTAL KNEE ARTHROPLASTY;  Surgeon: Gearlean Alf, MD;  Location: [facility];  Service: Orthopedics;  Laterality: Left;  . Wisdom tooth extraction      Family History  Problem Relation Age of Onset  . Multiple sclerosis Father   . Heart disease Mother     atrial fibrillation  . Hypertension Son   . Heart disease  Maternal Grandfather     History   Social History  . Marital Status: Married    Spouse Name: N/A  . Number of Children: N/A  . Years of Education: N/A   Occupational History  . Not on file.   Social History Main Topics  . Smoking status: Former Smoker -- 0 years    Types: Pipe, Cigars    Quit date: 04/01/1996  . Smokeless tobacco: Former Systems developer    Types: [place] date: 04/01/2009  . Alcohol Use: 0.0 oz/week    0 Standard drinks or equivalent per week     Comment: red wine x2 daily  . Drug Use: No  . Sexual Activity: Not Currently     Comment: lives with wife, eats clean diet. No major dietary restrtictions,    Other Topics Concern  . Not on file   Social History Narrative    Current Outpatient Prescriptions on File Prior to Visit  Medication Sig Dispense Refill  . amLODipine (NORVASC) 10 MG tablet Take 10 mg by mouth every morning.     [name] apixaban (ELIQUIS) 5 MG TABS tablet Take 1 tablet (5 mg total) by mouth  2 (two) times daily. 180 tablet 1  . cloNIDine (CATAPRES) 0.2 MG tablet Take 0.5 tablets (0.1 mg total) by mouth daily at 12 noon. At noon 1 tablet 0  . cloNIDine (CATAPRES) 0.3 MG tablet Take 1 tablet (0.3 mg total) by mouth 2 (two) times daily. 190 tablet 1  . Coenzyme Q10 (COQ10 PO) Take by mouth.    . dofetilide (TIKOSYN) 500 MCG capsule Take 1 capsule (500 mcg total) by mouth every 12 (twelve) hours. 60 capsule 6  . fenofibrate (TRICOR) 145 MG tablet TAKE 1 TABLET (145 MG TOTAL) BY MOUTH DAILY. 30 tablet 3  . finasteride (PROSCAR) 5 MG tablet Take 5 mg by mouth daily.     . Multiple Vitamins-Minerals (CENTRUM ADULTS PO) Take by mouth.    [name] omeprazole (PRILOSEC) 20 MG capsule Take 20 mg by mouth daily.     [name] PARoxetine (PAXIL) 40 MG tablet Take 1 tablet (40 mg total) by mouth every morning. 90 tablet 1  . ranitidine (ZANTAC) 300 MG tablet Take 1 tablet (300 mg total) by mouth at bedtime. 30 tablet 5   No current facility-administered medications on file  prior to visit.    Allergies  Allergen Reactions  . Codeine Itching and Rash  . Statins Other (See Comments)    Made very sick, doctor thought had heart attack; "my appearance, color, etc"  . Ace Inhibitors   . Benazepril   . Piroxicam Hives  . Pseudoephedrine     Can't take because it interacts with dofetilide     Review of Systems  Review of Systems  Constitutional: Negative for fever and malaise/fatigue.  HENT: Negative for congestion.   Eyes: Negative for discharge.  Respiratory: Negative for shortness of breath.   Cardiovascular: Negative for chest pain, palpitations and leg swelling.  Gastrointestinal: Negative for nausea, abdominal pain and diarrhea.  Genitourinary: Negative for dysuria.  Musculoskeletal: Negative for falls.  Skin: Negative for rash.  Neurological: Negative for loss of consciousness and headaches.  Endo/Heme/Allergies: Negative for polydipsia.  Psychiatric/Behavioral: Negative for depression and suicidal ideas. The patient is not nervous/anxious and does not have insomnia.     Objective  BP 133/62 mmHg  Pulse 58  Temp(Src) 98.5 F (36.9 C) (Oral)  Ht 6' 2.5" (1.892 m)  Wt 262 lb 2 oz (118.899 kg)  BMI 33.22 kg/m2  SpO2 98%  Physical Exam  Physical Exam  Constitutional: He is oriented to person, place, and time and well-developed, well-nourished, and in no distress. No distress.  HENT:  Head: Normocephalic and atraumatic.  Eyes: Conjunctivae are normal.  Neck: Neck supple. No thyromegaly present.  Cardiovascular: Normal rate, regular rhythm and normal heart sounds.   No murmur heard. Pulmonary/Chest: Effort normal and breath sounds normal. No respiratory distress.  Abdominal: He exhibits no distension and no mass. There is no tenderness.  Musculoskeletal: He exhibits no edema.  Neurological: He is alert and oriented to person, place, and time.  Skin: Skin is warm.  Psychiatric: Memory, affect and judgment normal.    Lab Results    Component Value Date   TSH 7.36* 06/03/2014   Lab Results  Component Value Date   WBC 5.6 06/03/2014   HGB 13.7 06/03/2014   HCT 40.9 06/03/2014   MCV 82.4 06/03/2014   PLT 214.0 06/03/2014   Lab Results  Component Value Date   CREATININE 1.26 06/03/2014   BUN 15 06/03/2014   NA 137 06/03/2014   K 4.3 06/03/2014   CL 104 06/03/2014  CO2 29 06/03/2014   Lab Results  Component Value Date   ALT 23 06/03/2014   AST 32 06/03/2014   ALKPHOS 49 06/03/2014   BILITOT 0.5 06/03/2014   Lab Results  Component Value Date   CHOL 187 06/03/2014   Lab Results  Component Value Date   HDL 68.10 06/03/2014   Lab Results  Component Value Date   LDLCALC 103* 06/03/2014   Lab Results  Component Value Date   TRIG 82.0 06/03/2014   Lab Results  Component Value Date   CHOLHDL 3 06/03/2014     Assessment & Plan  Obstructive sleep apnea Happy with new mask, uses daily  Essential hypertension Well controlled, no changes to meds. Encouraged heart healthy diet such as the DASH diet and exercise as tolerated.   GERD Avoid offending foods, take probiotics. Do not eat large meals in late evening and consider raising head of bed. Try alternating Omeprazole and Ranitidine as tolerated.  Coronary atherosclerosis of native coronary artery Asymptomatic, following with cardiology  Hypothyroidism On Levothyroxine, continue to monitor

## 2014-10-17 NOTE — Patient Instructions (Signed)
Start a probiotic daily such as [employer] or [employer], or online at [email or web address] company 10 strain probiotic 1 cap daily    Heartburn Heartburn is a painful, burning sensation in the chest. It may feel worse in certain positions, such as lying down or bending over. It is caused by stomach acid backing up into the tube that carries food from the mouth down to the stomach (lower esophagus).  CAUSES   Large meals.  Certain foods and drinks.  Exercise.  Increased acid production.  Being overweight or obese.  Certain medicines. SYMPTOMS   Burning pain in the chest or lower throat.  Bitter taste in the mouth.  Coughing. DIAGNOSIS  If the usual treatments for heartburn do not improve your symptoms, then tests may be done to see if there is another condition present. Possible tests may include:  X-rays.  Endoscopy. This is when a tube with a light and a camera on the end is used to examine the esophagus and the stomach.  A test to measure the amount of acid in the esophagus (pH test).  A test to see if the esophagus is working properly (esophageal manometry).  Blood, breath, or stool tests to check for bacteria that cause ulcers. TREATMENT   Your caregiver may tell you to use certain over-the-counter medicines (antacids, acid reducers) for mild heartburn.  Your caregiver may prescribe medicines to decrease the acid in your stomach or protect your stomach lining.  Your caregiver may recommend certain diet changes.  For severe cases, your caregiver may recommend that the head of your bed be elevated on blocks. (Sleeping with more pillows is not an effective treatment as it only changes the position of your head and does not improve the main problem of stomach acid refluxing into the esophagus.) HOME CARE INSTRUCTIONS   Take all medicines as directed by your caregiver.  Raise the head of your bed by putting blocks under the legs if instructed to  by your caregiver.  Do not exercise right after eating.  Avoid eating 2 or 3 hours before bed. Do not lie down right after eating.  Eat small meals throughout the day instead of 3 large meals.  Stop smoking if you smoke.  Maintain a healthy weight.  Identify foods and beverages that make your symptoms worse and avoid them. Foods you may want to avoid include:  Peppers.  Chocolate.  High-fat foods, including fried foods.  Spicy foods.  Garlic and onions.  Citrus fruits, including oranges, grapefruit, lemons, and limes.  Food containing tomatoes or tomato products.  Mint.  Carbonated drinks, caffeinated drinks, and alcohol.  Vinegar. SEEK IMMEDIATE MEDICAL CARE IF:  You have severe chest pain that goes down your arm or into your jaw or neck.  You feel sweaty, dizzy, or lightheaded.  You are short of breath.  You vomit blood.  You have difficulty or pain with swallowing.  You have bloody or black, tarry stools.  You have episodes of heartburn more than 3 times a week for more than 2 weeks. MAKE SURE YOU:  Understand these instructions.  Will watch your condition.  Will get help right away if you are not doing well or get worse. Document Released: 08/04/2008 Document Revised: 06/10/2011 Document Reviewed: 09/02/2010 [facility] Patient Information 2015 [place], [place]. This information is not intended to replace advice given to you by your health care provider. Make sure you discuss any questions you have with your health care provider.

## 2014-10-17 NOTE — Assessment & Plan Note (Signed)
Happy with new mask, uses daily

## 2014-10-17 NOTE — Progress Notes (Signed)
Pre visit review using our clinic review tool, if applicable. No additional management support is needed unless otherwise documented below in the visit note. 

## 2014-10-18 ENCOUNTER — Encounter (HOSPITAL_COMMUNITY): Payer: Self-pay

## 2014-10-18 ENCOUNTER — Ambulatory Visit (HOSPITAL_COMMUNITY)
Admission: RE | Admit: 2014-10-18 | Discharge: 2014-10-18 | Disposition: A | Discharge status: Home / Self Care | Payer: Medicare Other | Source: Ambulatory Visit | Attending: Internal Medicine | Admitting: Internal Medicine

## 2014-10-18 VITALS — BP 138/74 | HR 57 | Wt 259.5 lb

## 2014-10-18 DIAGNOSIS — I4892 Unspecified atrial flutter: Secondary | ICD-10-CM | POA: Diagnosis not present

## 2014-10-18 DIAGNOSIS — R001 Bradycardia, unspecified: Secondary | ICD-10-CM | POA: Insufficient documentation

## 2014-10-18 DIAGNOSIS — I480 Paroxysmal atrial fibrillation: Secondary | ICD-10-CM | POA: Insufficient documentation

## 2014-10-18 DIAGNOSIS — E039 Hypothyroidism, unspecified: Secondary | ICD-10-CM | POA: Insufficient documentation

## 2014-10-18 DIAGNOSIS — R011 Cardiac murmur, unspecified: Secondary | ICD-10-CM | POA: Insufficient documentation

## 2014-10-18 DIAGNOSIS — I10 Essential (primary) hypertension: Secondary | ICD-10-CM | POA: Diagnosis not present

## 2014-10-18 DIAGNOSIS — I6523 Occlusion and stenosis of bilateral carotid arteries: Secondary | ICD-10-CM | POA: Insufficient documentation

## 2014-10-18 DIAGNOSIS — Z79899 Other long term (current) drug therapy: Secondary | ICD-10-CM | POA: Insufficient documentation

## 2014-10-18 DIAGNOSIS — E119 Type 2 diabetes mellitus without complications: Secondary | ICD-10-CM | POA: Insufficient documentation

## 2014-10-18 DIAGNOSIS — I2510 Atherosclerotic heart disease of native coronary artery without angina pectoris: Secondary | ICD-10-CM | POA: Insufficient documentation

## 2014-10-18 DIAGNOSIS — J439 Emphysema, unspecified: Secondary | ICD-10-CM | POA: Diagnosis not present

## 2014-10-18 DIAGNOSIS — I358 Other nonrheumatic aortic valve disorders: Secondary | ICD-10-CM | POA: Diagnosis not present

## 2014-10-18 DIAGNOSIS — F419 Anxiety disorder, unspecified: Secondary | ICD-10-CM | POA: Diagnosis not present

## 2014-10-18 DIAGNOSIS — Z7901 Long term (current) use of anticoagulants: Secondary | ICD-10-CM | POA: Insufficient documentation

## 2014-10-18 DIAGNOSIS — G4730 Sleep apnea, unspecified: Secondary | ICD-10-CM | POA: Diagnosis not present

## 2014-10-18 DIAGNOSIS — K219 Gastro-esophageal reflux disease without esophagitis: Secondary | ICD-10-CM | POA: Diagnosis not present

## 2014-10-18 NOTE — Progress Notes (Signed)
Patient ID: [name] White, male   DOB: February 17, 1936, 79 y.o.   MRN: [id number] PCP: Benay Pillow Also sees the [place] in [place]  HPI: [name] White is a delightful 79 year old male with a history of dyspnea with a normal Myoview in April 2007.  He also has a history of micturition syncope, hypertension, hyperlipidemia,borderline diabetes, obesity, sleep apnea. Paroxysmal atrial flutter and underwent a. flutter ablation by Dr. Lovena Le 06/2008. Developed PAF in 2013 which was quite symptomatic and placed on Tikosyn and Eliquis.   Cardiac CT in 07/2011 Left Main: No plaque or stenosis.  Left Anterior Descending: Prominent mixed plaque in the proximal  LAD with probably moderate (50-70%) stenosis. The first diagonal  was a small vessel with calcified plaque at the ostium (could be  significant stenosis but cannot say for sure due to blooming  artifact from the calcium).  Left Circumflex: Mixed plaque in the proximal LCx with mild  stenosis.  Right Coronary Artery: Mixed plaque in the mid RCA with mild  stenosis. Dominant vessel.  Coronary Calcium Score: 355 Agatston units   Follow up: Returns to day to discuss ECG done at [facility] in June which showed sinus bradycardia at 48 bpm. He has h/o bradycardia with HRs always in 50s and 60s. Says he feels great. Going to [facility] 5 days per week and rides the exercise bike. HR gets up only to 85. Denies CP, SOB or presyncope. 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 Takes HR and BP at home and SBB range 105-133. HR 46-60. No recurrence of AF on Tikosyn. No bleeding with Tikosyn   Benazepril stopped due to angioedema after knee replacement. He suspects it may have been anesthetic but to be safe ACE-I was stopped. BP was running high so increased amlodipine to 10 in the m and 5mg  in am    Lab Results  Component Value Date   CHOL 187 06/03/2014   HDL 68.10 06/03/2014   LDLCALC 103* 06/03/2014   LDLDIRECT 141.6 11/12/2010   TRIG 82.0 06/03/2014   CHOLHDL 3 06/03/2014   ROS: All  systems negative except as listed in HPI, PMH and Problem List.  Past Medical History  Diagnosis Date  . Sleep apnea   . Hypothyroidism   . GERD (gastroesophageal reflux disease)   . Osteoarthritis   . Glucose intolerance (impaired glucose tolerance)   . Micturition syncope   . Hypertension   . Heart murmur     "slight"  . Atrial flutter     s/p ablation 07/17/08; echo EF 60-65% trivival AI  . Bradycardia   . H/O hiatal hernia   . Anxiety   . Dysrhythmia     a-fib.   . Coronary artery disease     "slight"  . Complication of anesthesia     "during ablation-heard people talking and he was groaning"  . Hepatitis     h/o of type A  . Chicken pox as a child  . Measles as a child  . Mumps as a child  . Emphysema of lung   . Diabetes mellitus type 2 in obese 04/10/2009    Qualifier: Diagnosis of  By: Arnoldo Morale MD, Balinda Quails     Current Outpatient Prescriptions  Medication Sig Dispense Refill  . amLODipine (NORVASC) 10 MG tablet Take 10 mg by mouth every morning.     [name] apixaban (ELIQUIS) 5 MG TABS tablet Take 1 tablet (5 mg total) by mouth 2 (two) times daily. 180 tablet 1  . cloNIDine (CATAPRES) 0.3  MG tablet Take 1 tablet (0.3 mg total) by mouth 2 (two) times daily. 190 tablet 1  . Coenzyme Q10 (COQ10 PO) Take by mouth.    . dofetilide (TIKOSYN) 500 MCG capsule Take 1 capsule (500 mcg total) by mouth every 12 (twelve) hours. 60 capsule 6  . fenofibrate (TRICOR) 145 MG tablet TAKE 1 TABLET (145 MG TOTAL) BY MOUTH DAILY. 30 tablet 3  . finasteride (PROSCAR) 5 MG tablet Take 5 mg by mouth daily.     [name] levothyroxine (SYNTHROID, LEVOTHROID) 150 MCG tablet Take 1 by mouth daily for 5 days a week and 2 by mouth the other two days a week. 108 tablet 1  . Multiple Vitamins-Minerals (CENTRUM ADULTS PO) Take by mouth.    [name] omeprazole (PRILOSEC) 20 MG capsule Take 20 mg by mouth daily.     [name] PARoxetine (PAXIL) 40 MG tablet Take 1 tablet (40 mg total) by mouth every morning. 90 tablet 1  .  ranitidine (ZANTAC) 300 MG tablet Take 1 tablet (300 mg total) by mouth at bedtime. 90 tablet 3   No current facility-administered medications for this encounter.    Filed Vitals:   10/18/14 1515  BP: 138/74  Pulse: 57  Weight: 259 lb 8 oz (117.708 kg)  SpO2: 98%    PHYSICAL EXAM: General:  Well appearing. No resp difficulty HEENT: normal Neck: supple. JVP flat. Carotids 2+ bilaterally; + faint bilat bruits (likely radiated from AoV). No lymphadenopathy or thryomegaly appreciated. Cor: PMI normal. Loletha Grayer regular. No rubs, gallops. Soft systolic murmur at RSB. S2 well preserved Lungs: clear Abdomen: soft, nontender, nondistended. No hepatosplenomegaly. No bruits or masses. Good bowel sounds. Extremities: no cyanosis, clubbing, rash, edema Neuro: alert & orientedx3, cranial nerves grossly intact. Moves all 4 extremities w/o difficulty. Affect pleasant.  EKG: SB 48 bpm 1AVB 214 ms QRS 148ms with LAFB QTC 434 ms ASSESSMENT & PLAN:  1) Bradycardia -He is mildly bradycardic which has been chronic for him. I suspect part of this is due to good physical conditioning and also part due to intrinsic conduction system disease. Currently no indication for pacing or other intervention. Would avoid AV nodal blockers. Can cut back clonidine as needed as this may also have some HR effect.   1) CAD - No s/s of ischemia - Will continue statin. Not on ASA due to Eliquis. Not on BB with bradycardia  2) Atrial Flutter - s/p ablation 2010  3) Atrial fibrillation, paroxysmal - NSR today. Continue Tikosyn and eliquis 5 mg BID.  3) HTN - BP under good control with current regimen. I am ok with using amlodipine up to 10 bid if needed. He gets dry mouth with clonidine. We discussed a possible switch toe Benicar (doubt he had angioedema from ACE-I and cross-over is lower) but he wants to wait on this.  - Not on BB with bradycardia  4) Carotid Stenosis - carotid duplex 12/2011; stable 0-39% bilateral  ICA stenosis  5) Aortic valve murmur - repeat echo   Polly Barner,MD 3:44 PM

## 2014-10-18 NOTE — Addendum Note (Signed)
Encounter addended by: Effie Berkshire, RN on: 10/18/2014  4:14 PM<BR>     Documentation filed: Patient Instructions Section

## 2014-10-18 NOTE — Patient Instructions (Signed)
Follow up 1 year 

## 2014-10-19 ENCOUNTER — Other Ambulatory Visit: Payer: Self-pay | Admitting: Family Medicine

## 2014-10-20 ENCOUNTER — Telehealth (HOSPITAL_COMMUNITY): Payer: Self-pay | Admitting: Vascular Surgery

## 2014-10-20 DIAGNOSIS — I5022 Chronic systolic (congestive) heart failure: Secondary | ICD-10-CM

## 2014-10-20 MED ORDER — AMLODIPINE BESYLATE 10 MG PO TABS: 10.0000 mg | ORAL_TABLET | Freq: Every morning | ORAL | Status: DC

## 2014-10-20 MED ORDER — APIXABAN 5 MG PO TABS: 5.0000 mg | ORAL_TABLET | Freq: Two times a day (BID) | ORAL | Status: DC

## 2014-10-20 NOTE — Telephone Encounter (Signed)
Pt called he needs refills Eliquis, and Amlodipine 90 day sent to [facility] .[name] Please advise

## 2014-10-20 NOTE — Telephone Encounter (Signed)
As requested med refills returned to pharmacy

## 2014-10-20 NOTE — Telephone Encounter (Signed)
OPEN IN ERROR 

## 2014-10-31 DIAGNOSIS — H11153 Pinguecula, bilateral: Secondary | ICD-10-CM | POA: Diagnosis not present

## 2014-10-31 DIAGNOSIS — H52223 Regular astigmatism, bilateral: Secondary | ICD-10-CM | POA: Diagnosis not present

## 2014-10-31 DIAGNOSIS — Z9841 Cataract extraction status, right eye: Secondary | ICD-10-CM | POA: Diagnosis not present

## 2014-10-31 DIAGNOSIS — H524 Presbyopia: Secondary | ICD-10-CM | POA: Diagnosis not present

## 2014-10-31 DIAGNOSIS — H5203 Hypermetropia, bilateral: Secondary | ICD-10-CM | POA: Diagnosis not present

## 2014-10-31 NOTE — Assessment & Plan Note (Signed)
On Levothyroxine, continue to monitor 

## 2014-10-31 NOTE — Assessment & Plan Note (Signed)
Well controlled, no changes to meds. Encouraged heart healthy diet such as the DASH diet and exercise as tolerated.  °

## 2014-10-31 NOTE — Assessment & Plan Note (Signed)
Asymptomatic, following with cardiology

## 2014-10-31 NOTE — Assessment & Plan Note (Signed)
Avoid offending foods, take probiotics. Do not eat large meals in late evening and consider raising head of bed. Try alternating Omeprazole and Ranitidine as tolerated.

## 2014-11-09 LAB — HM DIABETES EYE EXAM

## 2014-11-14 ENCOUNTER — Encounter: Payer: Self-pay | Admitting: Family Medicine

## 2014-12-15 ENCOUNTER — Ambulatory Visit (INDEPENDENT_AMBULATORY_CARE_PROVIDER_SITE_OTHER): Payer: Medicare Other

## 2014-12-15 DIAGNOSIS — Z23 Encounter for immunization: Secondary | ICD-10-CM

## 2015-01-06 DIAGNOSIS — G4733 Obstructive sleep apnea (adult) (pediatric): Secondary | ICD-10-CM | POA: Diagnosis not present

## 2015-02-07 ENCOUNTER — Other Ambulatory Visit: Payer: Self-pay | Admitting: Family Medicine

## 2015-04-20 ENCOUNTER — Ambulatory Visit: Payer: Medicare Other | Admitting: Family Medicine

## 2015-05-26 ENCOUNTER — Encounter: Payer: Self-pay | Admitting: Family Medicine

## 2015-05-26 ENCOUNTER — Ambulatory Visit (INDEPENDENT_AMBULATORY_CARE_PROVIDER_SITE_OTHER): Payer: Medicare Other | Admitting: Family Medicine

## 2015-05-26 VITALS — BP 132/88 | HR 55 | Temp 98.3°F | Ht 74.5 in | Wt 266.0 lb

## 2015-05-26 DIAGNOSIS — I10 Essential (primary) hypertension: Secondary | ICD-10-CM

## 2015-05-26 DIAGNOSIS — I480 Paroxysmal atrial fibrillation: Secondary | ICD-10-CM | POA: Diagnosis not present

## 2015-05-26 DIAGNOSIS — E119 Type 2 diabetes mellitus without complications: Secondary | ICD-10-CM

## 2015-05-26 DIAGNOSIS — D649 Anemia, unspecified: Secondary | ICD-10-CM

## 2015-05-26 DIAGNOSIS — E039 Hypothyroidism, unspecified: Secondary | ICD-10-CM

## 2015-05-26 DIAGNOSIS — R001 Bradycardia, unspecified: Secondary | ICD-10-CM

## 2015-05-26 DIAGNOSIS — E785 Hyperlipidemia, unspecified: Secondary | ICD-10-CM | POA: Diagnosis not present

## 2015-05-26 DIAGNOSIS — E669 Obesity, unspecified: Secondary | ICD-10-CM

## 2015-05-26 DIAGNOSIS — K219 Gastro-esophageal reflux disease without esophagitis: Secondary | ICD-10-CM

## 2015-05-26 DIAGNOSIS — E1169 Type 2 diabetes mellitus with other specified complication: Secondary | ICD-10-CM

## 2015-05-26 NOTE — Progress Notes (Signed)
Pre visit review using our clinic review tool, if applicable. No additional management support is needed unless otherwise documented below in the visit note. 

## 2015-05-26 NOTE — Patient Instructions (Addendum)
[place] 10 strains cap 1 cap daily  Melatonin or L Tryptophan for sleep  Food Choices for Gastroesophageal Reflux Disease, Adult When you have gastroesophageal reflux disease (GERD), the foods you eat and your eating habits are very important. Choosing the right foods can help ease your discomfort.  WHAT GUIDELINES DO I NEED TO FOLLOW?   Choose fruits, vegetables, whole grains, and low-fat dairy products.   Choose low-fat meat, fish, and poultry.  Limit fats such as oils, salad dressings, butter, nuts, and avocado.   Keep a food diary. This helps you identify foods that cause symptoms.   Avoid foods that cause symptoms. These may be different for everyone.   Eat small meals often instead of 3 large meals a day.   Eat your meals slowly, in a place where you are relaxed.   Limit fried foods.   Cook foods using methods other than frying.   Avoid drinking alcohol.   Avoid drinking large amounts of liquids with your meals.   Avoid bending over or lying down until 2-3 hours after eating.  WHAT FOODS ARE NOT RECOMMENDED?  These are some foods and drinks that may make your symptoms worse: Vegetables Tomatoes. Tomato juice. Tomato and spaghetti sauce. Chili peppers. Onion and garlic. Horseradish. Fruits Oranges, grapefruit, and lemon (fruit and juice). Meats High-fat meats, fish, and poultry. This includes hot dogs, ribs, ham, sausage, salami, and bacon. Dairy Whole milk and chocolate milk. Sour cream. Cream. Butter. Ice cream. Cream cheese.  Drinks Coffee and tea. Bubbly (carbonated) drinks or energy drinks. Condiments Hot sauce. Barbecue sauce.  Sweets/Desserts Chocolate and cocoa. Donuts. Peppermint and spearmint. Fats and Oils High-fat foods. This includes [place] fries and potato chips. Other Vinegar. Strong spices. This includes black pepper, white pepper, red pepper, cayenne, curry powder, cloves, ginger, and chili powder. The items listed above may not  be a complete list of foods and drinks to avoid. Contact your dietitian for more information.   This information is not intended to replace advice given to you by your health care provider. Make sure you discuss any questions you have with your health care provider.   Document Released: 09/17/2011 Document Revised: 04/08/2014 Document Reviewed: 01/20/2013 Elsevier Interactive Patient Education [employer].

## 2015-05-27 LAB — MICROALBUMIN / CREATININE URINE RATIO
CREATININE, URINE: 167 mg/dL (ref 20–370)
MICROALB UR: 5.6 mg/dL
Microalb Creat Ratio: 34 mcg/mg creat — ABNORMAL HIGH (ref <30)

## 2015-05-27 LAB — LIPID PANEL
CHOLESTEROL: 187 mg/dL (ref 125–200)
HDL: 82 mg/dL (ref >=40)
LDL Cholesterol: 91 mg/dL (ref <130)
TRIGLYCERIDES: 69 mg/dL (ref <150)
Total CHOL/HDL Ratio: 2.3 Ratio (ref <=5.0)
VLDL: 14 mg/dL (ref <30)

## 2015-05-27 LAB — COMPREHENSIVE METABOLIC PANEL
ALBUMIN: 4.3 g/dL (ref 3.6–5.1)
ALT: 23 U/L (ref 9–46)
AST: 36 U/L — AB (ref 10–35)
Alkaline Phosphatase: 34 U/L — ABNORMAL LOW (ref 40–115)
BILIRUBIN TOTAL: 0.5 mg/dL (ref 0.2–1.2)
BUN: 18 mg/dL (ref 7–25)
CALCIUM: 9.4 mg/dL (ref 8.6–10.3)
CHLORIDE: 103 mmol/L (ref 98–110)
CO2: 22 mmol/L (ref 20–31)
Creat: 1.16 mg/dL (ref 0.70–1.18)
GLUCOSE: 93 mg/dL (ref 65–99)
POTASSIUM: 3.9 mmol/L (ref 3.5–5.3)
Sodium: 138 mmol/L (ref 135–146)
Total Protein: 7.2 g/dL (ref 6.1–8.1)

## 2015-05-27 LAB — CBC
HEMATOCRIT: 40.1 % (ref 39.0–52.0)
HEMOGLOBIN: 12.9 g/dL — AB (ref 13.0–17.0)
MCH: 27.4 pg (ref 26.0–34.0)
MCHC: 32.2 g/dL (ref 30.0–36.0)
MCV: 85.1 fL (ref 78.0–100.0)
MPV: 10.9 fL (ref 8.6–12.4)
Platelets: 232 10*3/uL (ref 150–400)
RBC: 4.71 MIL/uL (ref 4.22–5.81)
RDW: 15.7 % — AB (ref 11.5–15.5)
WBC: 5.6 10*3/uL (ref 4.0–10.5)

## 2015-05-27 LAB — HEMOGLOBIN A1C
Hgb A1c MFr Bld: 7 % — ABNORMAL HIGH (ref <5.7)
MEAN PLASMA GLUCOSE: 154 mg/dL — AB (ref <117)

## 2015-05-27 LAB — TSH: TSH: 2.53 mIU/L (ref 0.40–4.50)

## 2015-05-27 LAB — MAGNESIUM: MAGNESIUM: 2 mg/dL (ref 1.5–2.5)

## 2015-06-04 ENCOUNTER — Encounter: Payer: Self-pay | Admitting: Family Medicine

## 2015-06-04 DIAGNOSIS — D649 Anemia, unspecified: Secondary | ICD-10-CM | POA: Insufficient documentation

## 2015-06-04 HISTORY — DX: Anemia, unspecified: D64.9

## 2015-06-04 NOTE — Progress Notes (Signed)
Patient ID: [name] White, male   DOB: 1935/08/02, 80 y.o.   MRN: [id number]   Subjective:    Patient ID: [name] White, male    DOB: Jul 14, 1935, 80 y.o.   MRN: [id number]  Chief Complaint  Patient presents with  . Follow-up    HPI Patient is in today for follow up. Feels well. Denies recent illness. No acute concerns. Is interested in trying to cut back on Omeprazole, is having minimal symptoms. Denies CP/palp/SOB/HA/congestion/fevers/GI or GU c/o. Taking meds as prescribed  Past Medical History  Diagnosis Date  . Sleep apnea   . Hypothyroidism   . GERD (gastroesophageal reflux disease)   . Osteoarthritis   . Glucose intolerance (impaired glucose tolerance)   . Micturition syncope   . Hypertension   . Heart murmur     "slight"  . Atrial flutter (HCC)     s/p ablation 07/17/08; echo EF 60-65% trivival AI  . Bradycardia   . H/O hiatal hernia   . Anxiety   . Dysrhythmia     a-fib.   . Coronary artery disease     "slight"  . Complication of anesthesia     "during ablation-heard people talking and he was groaning"  . Hepatitis     h/o of type A  . Chicken pox as a child  . Measles as a child  . Mumps as a child  . Emphysema of lung (HCC)   . Diabetes mellitus type 2 in obese [facility]) 04/10/2009    Qualifier: Diagnosis of  By: Lovell Sheehan MD, Balinda Quails   . Anemia 06/04/2015    Past Surgical History  Procedure Laterality Date  . Tonsillectomy  1940's  . Total knee arthroplasty  1990's    right  . Cataract extraction w/ intraocular lens  implant, bilateral  ~ 2007  . Cardiac electrophysiology mapping and ablation  06/2008  . Cardiac catheterization    . Colonoscopy    . Foot tendon surgery Left   . Total knee arthroplasty Left 08/02/2013    Procedure: LEFT TOTAL KNEE ARTHROPLASTY;  Surgeon: Loanne Drilling, MD;  Location: [facility];  Service: Orthopedics;  Laterality: Left;  . Wisdom tooth extraction      Family History  Problem Relation Age of Onset  . Multiple sclerosis  Father   . Heart disease Mother     atrial fibrillation  . Hypertension Son   . Heart disease Maternal Grandfather     Social History   Social History  . Marital Status: Married    Spouse Name: N/A  . Number of Children: N/A  . Years of Education: N/A   Occupational History  . Not on file.   Social History Main Topics  . Smoking status: Former Smoker -- 0 years    Types: Pipe, Cigars    Quit date: 04/01/1996  . Smokeless tobacco: Former Neurosurgeon    Types: Chew    Quit date: 04/01/2009  . Alcohol Use: 0.0 oz/week    0 Standard drinks or equivalent per week     Comment: red wine x2 daily  . Drug Use: No  . Sexual Activity: Not Currently     Comment: lives with wife, eats clean diet. No major dietary restrtictions,    Other Topics Concern  . Not on file   Social History Narrative    Outpatient Prescriptions Prior to Visit  Medication Sig Dispense Refill  . amLODipine (NORVASC) 10 MG tablet Take 1 tablet (10 mg total) by  mouth every morning. 90 tablet 3  . apixaban (ELIQUIS) 5 MG TABS tablet Take 1 tablet (5 mg total) by mouth 2 (two) times daily. 180 tablet 3  . cloNIDine (CATAPRES) 0.3 MG tablet Take 1 tablet (0.3 mg total) by mouth 2 (two) times daily. 190 tablet 1  . Coenzyme Q10 (COQ10 PO) Take by mouth.    . dofetilide (TIKOSYN) 500 MCG capsule Take 1 capsule (500 mcg total) by mouth every 12 (twelve) hours. 60 capsule 6  . fenofibrate (TRICOR) 145 MG tablet TAKE 1 TABLET (145 MG TOTAL) BY MOUTH DAILY. 30 tablet 6  . finasteride (PROSCAR) 5 MG tablet Take 5 mg by mouth daily.     [name] levothyroxine (SYNTHROID, LEVOTHROID) 150 MCG tablet Take 1 by mouth daily for 5 days a week and 2 by mouth the other two days a week. 108 tablet 1  . Multiple Vitamins-Minerals (CENTRUM ADULTS PO) Take by mouth.    [name] omeprazole (PRILOSEC) 20 MG capsule Take 20 mg by mouth daily.     [name] PARoxetine (PAXIL) 40 MG tablet Take 1 tablet (40 mg total) by mouth every morning. 90 tablet 1  .  ranitidine (ZANTAC) 300 MG tablet Take 1 tablet (300 mg total) by mouth at bedtime. 90 tablet 3   No facility-administered medications prior to visit.    Allergies  Allergen Reactions  . Codeine Itching and Rash  . Statins Other (See Comments)    Made very sick, doctor thought had heart attack; "my appearance, color, etc"  . Ace Inhibitors   . Benazepril   . Piroxicam Hives  . Pseudoephedrine     Can't take because it interacts with dofetilide     Review of Systems  Constitutional: Negative for fever and malaise/fatigue.  HENT: Negative for congestion.   Eyes: Negative for discharge.  Respiratory: Negative for shortness of breath.   Cardiovascular: Negative for chest pain, palpitations and leg swelling.  Gastrointestinal: Negative for nausea and abdominal pain.  Genitourinary: Negative for dysuria.  Musculoskeletal: Negative for falls.  Skin: Negative for rash.  Neurological: Negative for loss of consciousness and headaches.  Endo/Heme/Allergies: Negative for environmental allergies.  Psychiatric/Behavioral: Negative for depression. The patient is not nervous/anxious.        Objective:    Physical Exam  Constitutional: He is oriented to person, place, and time. He appears well-developed and well-nourished. No distress.  HENT:  Head: Normocephalic and atraumatic.  Eyes: Conjunctivae are normal.  Neck: Neck supple. No thyromegaly present.  Cardiovascular: Normal rate, regular rhythm and normal heart sounds.   No murmur heard. Pulmonary/Chest: Effort normal and breath sounds normal. No respiratory distress. He has no wheezes.  Abdominal: Soft. Bowel sounds are normal. He exhibits no mass. There is no tenderness.  Musculoskeletal: He exhibits no edema.  Lymphadenopathy:    He has no cervical adenopathy.  Neurological: He is alert and oriented to person, place, and time.  Skin: Skin is warm and dry.  Psychiatric: He has a normal mood and affect. His behavior is normal.     BP 132/88 mmHg  Pulse 55  Temp(Src) 98.3 F (36.8 C) (Oral)  Ht 6' 2.5" (1.892 m)  Wt 266 lb (120.657 kg)  BMI 33.71 kg/m2  SpO2 98% Wt Readings from Last 3 Encounters:  05/26/15 266 lb (120.657 kg)  10/18/14 259 lb 8 oz (117.708 kg)  10/17/14 262 lb 2 oz (118.899 kg)     Lab Results  Component Value Date   WBC 5.6 05/26/2015  HGB 12.9* 05/26/2015   HCT 40.1 05/26/2015   PLT 232 05/26/2015   GLUCOSE 93 05/26/2015   CHOL 187 05/26/2015   TRIG 69 05/26/2015   HDL 82 05/26/2015   LDLDIRECT 141.6 11/12/2010   LDLCALC 91 05/26/2015   ALT 23 05/26/2015   AST 36* 05/26/2015   NA 138 05/26/2015   K 3.9 05/26/2015   CL 103 05/26/2015   CREATININE 1.16 05/26/2015   BUN 18 05/26/2015   CO2 22 05/26/2015   TSH 2.53 05/26/2015   PSA 1.42 09/13/2013   INR 1.01 07/26/2013   HGBA1C 7.0* 05/26/2015   MICROALBUR 5.6 05/26/2015    Lab Results  Component Value Date   TSH 2.53 05/26/2015   Lab Results  Component Value Date   WBC 5.6 05/26/2015   HGB 12.9* 05/26/2015   HCT 40.1 05/26/2015   MCV 85.1 05/26/2015   PLT 232 05/26/2015   Lab Results  Component Value Date   NA 138 05/26/2015   K 3.9 05/26/2015   CO2 22 05/26/2015   GLUCOSE 93 05/26/2015   BUN 18 05/26/2015   CREATININE 1.16 05/26/2015   BILITOT 0.5 05/26/2015   ALKPHOS 34* 05/26/2015   AST 36* 05/26/2015   ALT 23 05/26/2015   PROT 7.2 05/26/2015   ALBUMIN 4.3 05/26/2015   CALCIUM 9.4 05/26/2015   GFR 58.99* 06/03/2014   Lab Results  Component Value Date   CHOL 187 05/26/2015   Lab Results  Component Value Date   HDL 82 05/26/2015   Lab Results  Component Value Date   LDLCALC 91 05/26/2015   Lab Results  Component Value Date   TRIG 69 05/26/2015   Lab Results  Component Value Date   CHOLHDL 2.3 05/26/2015   Lab Results  Component Value Date   HGBA1C 7.0* 05/26/2015       Assessment & Plan:   Problem List Items Addressed This Visit    Anemia    Mild Increase leafy greens,  consider increased lean red meat and using cast iron cookware. Continue to monitor, report any concerns      Atrial fibrillation (HCC)    Tolerating Eliquis, reate controlled      Relevant Orders   CBC (Completed)   Comp Met (CMET) (Completed)   Hemoglobin A1c (Completed)   Lipid panel (Completed)   Magnesium (Completed)   Urine Microalbumin w/creat. ratio (Completed)   TSH (Completed)   Bradycardia - Primary   Relevant Orders   CBC (Completed)   Comp Met (CMET) (Completed)   Hemoglobin A1c (Completed)   Lipid panel (Completed)   Magnesium (Completed)   Urine Microalbumin w/creat. ratio (Completed)   TSH (Completed)   Diabetes mellitus type 2 in obese (HCC)    hgba1c acceptable, minimize simple carbs. Increase exercise as tolerated. Continue current meds      Essential hypertension   Relevant Orders   CBC (Completed)   Comp Met (CMET) (Completed)   Hemoglobin A1c (Completed)   Lipid panel (Completed)   Magnesium (Completed)   Urine Microalbumin w/creat. ratio (Completed)   TSH (Completed)   GERD    Avoid offending foods, start probiotics. Do not eat large meals in late evening and consider raising head of bed. May use Omeprazole prn      Hyperlipidemia   Relevant Orders   CBC (Completed)   Comp Met (CMET) (Completed)   Hemoglobin A1c (Completed)   Lipid panel (Completed)   Magnesium (Completed)   Urine Microalbumin w/creat. ratio (Completed)   TSH (  Completed)   Hypothyroidism    On Levothyroxine, continue to monitor      Relevant Orders   CBC (Completed)   Comp Met (CMET) (Completed)   Hemoglobin A1c (Completed)   Lipid panel (Completed)   Magnesium (Completed)   Urine Microalbumin w/creat. ratio (Completed)   TSH (Completed)      I have discontinued Mr. Jolaine Click ranitidine. I am also having him maintain his omeprazole, finasteride, dofetilide, PARoxetine, Coenzyme Q10 (COQ10 PO), Multiple Vitamins-Minerals (CENTRUM ADULTS PO), cloNIDine, levothyroxine,  apixaban, amLODipine, and fenofibrate.  No orders of the defined types were placed in this encounter.     Penni Homans, MD

## 2015-06-04 NOTE — Assessment & Plan Note (Signed)
hgba1c acceptable, minimize simple carbs. Increase exercise as tolerated. Continue current meds 

## 2015-06-04 NOTE — Assessment & Plan Note (Signed)
On Levothyroxine, continue to monitor 

## 2015-06-04 NOTE — Assessment & Plan Note (Signed)
Avoid offending foods, start probiotics. Do not eat large meals in late evening and consider raising head of bed. May use Omeprazole prn 

## 2015-06-04 NOTE — Assessment & Plan Note (Signed)
Mild Increase leafy greens, consider increased lean red meat and using cast iron cookware. Continue to monitor, report any concerns

## 2015-06-04 NOTE — Assessment & Plan Note (Signed)
Tolerating Eliquis, reate controlled

## 2015-06-21 DIAGNOSIS — G4733 Obstructive sleep apnea (adult) (pediatric): Secondary | ICD-10-CM | POA: Diagnosis not present

## 2015-07-28 ENCOUNTER — Other Ambulatory Visit: Payer: Self-pay | Admitting: Family Medicine

## 2015-07-28 MED ORDER — FENOFIBRATE 145 MG PO TABS: ORAL_TABLET | ORAL | Status: DC

## 2015-08-07 ENCOUNTER — Other Ambulatory Visit: Payer: Self-pay | Admitting: Family Medicine

## 2015-08-07 MED ORDER — FENOFIBRATE 145 MG PO TABS
ORAL_TABLET | ORAL | Status: DC
Start: 2015-08-07 — End: 2017-01-10

## 2015-08-31 LAB — HEMOGLOBIN A1C: Hemoglobin A1C: 7.2

## 2015-09-05 ENCOUNTER — Encounter: Payer: Self-pay | Admitting: Family Medicine

## 2015-09-05 ENCOUNTER — Other Ambulatory Visit: Payer: Self-pay | Admitting: Family Medicine

## 2015-09-05 DIAGNOSIS — S6010XS Contusion of unspecified finger with damage to nail, sequela: Secondary | ICD-10-CM

## 2015-09-05 MED ORDER — CEPHALEXIN 500 MG PO CAPS: 500.0000 mg | ORAL_CAPSULE | Freq: Three times a day (TID) | ORAL | Status: DC

## 2015-10-12 DIAGNOSIS — M67441 Ganglion, right hand: Secondary | ICD-10-CM | POA: Diagnosis not present

## 2015-10-18 ENCOUNTER — Telehealth (HOSPITAL_COMMUNITY): Payer: Self-pay | Admitting: *Deleted

## 2015-10-18 NOTE — Telephone Encounter (Signed)
Pt called to ask if it is ok to take MoviPrep prior to his egd/colonoscopy with his other medications.  Advised ok to take this.  Also advised pt we had received form from the [place] asking for OK for him to hold the Eliquis for the procedure, Dr Denice Paradise will address when he returns from vacation, procedure is sch for 12/05/15

## 2015-10-25 ENCOUNTER — Telehealth (HOSPITAL_COMMUNITY): Payer: Self-pay | Admitting: *Deleted

## 2015-10-25 NOTE — Telephone Encounter (Signed)
Received note from [place], pt needs clearance to be off Eliquis for Right middle finger cyst excision, ok per Dr Haroldine Laws, note faxed to Margarita Grizzle at [phone]  Received note from [facility], pt needs clearance to be off Eliquis for EGD/Colonoscopy on 12/05/15, per Dr Haroldine Laws pt ok to hold for 2 days, note faxed back to them at [phone]

## 2015-11-03 DIAGNOSIS — M67441 Ganglion, right hand: Secondary | ICD-10-CM | POA: Diagnosis not present

## 2015-11-07 NOTE — Telephone Encounter (Signed)
Received note for clarification from [facility], they need to make sure pt can hold Eliquis for surgery, per Dr Haroldine Laws "hold eliquis for 24 hours prior to surgery, restart ASAP" note faxed back to them at [phone]

## 2015-11-10 DIAGNOSIS — H524 Presbyopia: Secondary | ICD-10-CM | POA: Diagnosis not present

## 2015-11-10 DIAGNOSIS — Z9841 Cataract extraction status, right eye: Secondary | ICD-10-CM | POA: Diagnosis not present

## 2015-11-10 DIAGNOSIS — H11153 Pinguecula, bilateral: Secondary | ICD-10-CM | POA: Diagnosis not present

## 2015-11-10 DIAGNOSIS — Z961 Presence of intraocular lens: Secondary | ICD-10-CM | POA: Diagnosis not present

## 2015-11-10 DIAGNOSIS — Z9842 Cataract extraction status, left eye: Secondary | ICD-10-CM | POA: Diagnosis not present

## 2015-11-10 LAB — HM DIABETES EYE EXAM

## 2015-11-21 ENCOUNTER — Telehealth (HOSPITAL_COMMUNITY): Payer: Self-pay

## 2015-11-21 NOTE — Telephone Encounter (Signed)
Patient calling CHF clinic triage to ask if his eliquis can be reduced. Had a recent eye exam and was told he either has glaucoma or an eye bleed, and now worries the eliquis may be at a higher dose than necessary. Will ask provider if patient may safely reduce dose to 2.5 mg twice daily.  Renee Pain, RN

## 2015-11-23 ENCOUNTER — Encounter: Payer: Self-pay | Admitting: Family Medicine

## 2015-11-24 ENCOUNTER — Encounter: Payer: Self-pay | Admitting: Family Medicine

## 2015-11-24 ENCOUNTER — Ambulatory Visit (INDEPENDENT_AMBULATORY_CARE_PROVIDER_SITE_OTHER): Payer: Medicare Other | Admitting: Family Medicine

## 2015-11-24 VITALS — BP 118/70 | HR 54 | Temp 98.2°F | Ht 74.0 in | Wt 264.8 lb

## 2015-11-24 DIAGNOSIS — E039 Hypothyroidism, unspecified: Secondary | ICD-10-CM | POA: Diagnosis not present

## 2015-11-24 DIAGNOSIS — E1169 Type 2 diabetes mellitus with other specified complication: Secondary | ICD-10-CM

## 2015-11-24 DIAGNOSIS — I480 Paroxysmal atrial fibrillation: Secondary | ICD-10-CM

## 2015-11-24 DIAGNOSIS — D649 Anemia, unspecified: Secondary | ICD-10-CM

## 2015-11-24 DIAGNOSIS — E119 Type 2 diabetes mellitus without complications: Secondary | ICD-10-CM

## 2015-11-24 DIAGNOSIS — I10 Essential (primary) hypertension: Secondary | ICD-10-CM

## 2015-11-24 DIAGNOSIS — Z23 Encounter for immunization: Secondary | ICD-10-CM | POA: Diagnosis not present

## 2015-11-24 DIAGNOSIS — E669 Obesity, unspecified: Secondary | ICD-10-CM

## 2015-11-24 DIAGNOSIS — E785 Hyperlipidemia, unspecified: Secondary | ICD-10-CM

## 2015-11-24 DIAGNOSIS — E559 Vitamin D deficiency, unspecified: Secondary | ICD-10-CM

## 2015-11-24 DIAGNOSIS — Z0000 Encounter for general adult medical examination without abnormal findings: Secondary | ICD-10-CM

## 2015-11-24 DIAGNOSIS — L578 Other skin changes due to chronic exposure to nonionizing radiation: Secondary | ICD-10-CM

## 2015-11-24 HISTORY — DX: Vitamin D deficiency, unspecified: E55.9

## 2015-11-24 MED ORDER — LEVOTHYROXINE SODIUM 175 MCG PO TABS: 175.0000 ug | ORAL_TABLET | Freq: Every day | ORAL | 1 refills | Status: DC

## 2015-11-24 MED ORDER — VITAMIN D (ERGOCALCIFEROL) 1.25 MG (50000 UNIT) PO CAPS: 50000.0000 [IU] | ORAL_CAPSULE | ORAL | 1 refills | Status: DC

## 2015-11-24 NOTE — Patient Instructions (Addendum)
Start Vit D hi dose once a week and over the counter vit D 1000 IU cap daily   Preventive Care for Adults, Male A healthy lifestyle and preventive care can promote health and wellness. Preventive health guidelines for men include the following key practices:  A routine yearly physical is a good way to check with your health care provider about your health and preventative screening. It is a chance to share any concerns and updates on your health and to receive a thorough exam.  Visit your dentist for a routine exam and preventative care every 6 months. Brush your teeth twice a day and floss once a day. Good oral hygiene prevents tooth decay and gum disease.  The frequency of eye exams is based on your age, health, family medical history, use of contact lenses, and other factors. Follow your health care provider's recommendations for frequency of eye exams.  Eat a healthy diet. Foods such as vegetables, fruits, whole grains, low-fat dairy products, and lean protein foods contain the nutrients you need without too many calories. Decrease your intake of foods high in solid fats, added sugars, and salt. Eat the right amount of calories for you.Get information about a proper diet from your health care provider, if necessary.  Regular physical exercise is one of the most important things you can do for your health. Most adults should get at least 150 minutes of moderate-intensity exercise (any activity that increases your heart rate and causes you to sweat) each week. In addition, most adults need muscle-strengthening exercises on 2 or more days a week.  Maintain a healthy weight. The body mass index (BMI) is a screening tool to identify possible weight problems. It provides an estimate of body fat based on height and weight. Your health care provider can find your BMI and can help you achieve or maintain a healthy weight.For adults 20 years and older:  A BMI below 18.5 is considered underweight.  A  BMI of 18.5 to 24.9 is normal.  A BMI of 25 to 29.9 is considered overweight.  A BMI of 30 and above is considered obese.  Maintain normal blood lipids and cholesterol levels by exercising and minimizing your intake of saturated fat. Eat a balanced diet with plenty of fruit and vegetables. Blood tests for lipids and cholesterol should begin at age 41 and be repeated every 5 years. If your lipid or cholesterol levels are high, you are over 50, or you are at high risk for heart disease, you may need your cholesterol levels checked more frequently.Ongoing high lipid and cholesterol levels should be treated with medicines if diet and exercise are not working.  If you smoke, find out from your health care provider how to quit. If you do not use tobacco, do not start.  Lung cancer screening is recommended for adults aged 55-80 years who are at high risk for developing lung cancer because of a history of smoking. A yearly low-dose CT scan of the lungs is recommended for people who have at least a 30-pack-year history of smoking and are a current smoker or have quit within the past 15 years. A pack year of smoking is smoking an average of 1 pack of cigarettes a day for 1 year (for example: 1 pack a day for 30 years or 2 packs a day for 15 years). Yearly screening should continue until the smoker has stopped smoking for at least 15 years. Yearly screening should be stopped for people who develop a health problem  that would prevent them from having lung cancer treatment.  If you choose to drink alcohol, do not have more than 2 drinks per day. One drink is considered to be 12 ounces (355 mL) of beer, 5 ounces (148 mL) of wine, or 1.5 ounces (44 mL) of liquor.  Avoid use of street drugs. Do not share needles with anyone. Ask for help if you need support or instructions about stopping the use of drugs.  High blood pressure causes heart disease and increases the risk of stroke. Your blood pressure should be  checked at least every 1-2 years. Ongoing high blood pressure should be treated with medicines, if weight loss and exercise are not effective.  If you are 31-66 years old, ask your health care provider if you should take aspirin to prevent heart disease.  Diabetes screening is done by taking a blood sample to check your blood glucose level after you have not eaten for a certain period of time (fasting). If you are not overweight and you do not have risk factors for diabetes, you should be screened once every 3 years starting at age 29. If you are overweight or obese and you are 50-31 years of age, you should be screened for diabetes every year as part of your cardiovascular risk assessment.  Colorectal cancer can be detected and often prevented. Most routine colorectal cancer screening begins at the age of 48 and continues through age 80. However, your health care provider may recommend screening at an earlier age if you have risk factors for colon cancer. On a yearly basis, your health care provider may provide home test kits to check for hidden blood in the stool. Use of a small camera at the end of a tube to directly examine the colon (sigmoidoscopy or colonoscopy) can detect the earliest forms of colorectal cancer. Talk to your health care provider about this at age 55, when routine screening begins. Direct exam of the colon should be repeated every 5-10 years through age 14, unless early forms of precancerous polyps or small growths are found.  People who are at an increased risk for hepatitis B should be screened for this virus. You are considered at high risk for hepatitis B if:  You were born in a country where hepatitis B occurs often. Talk with your health care provider about which countries are considered high risk.  Your parents were born in a high-risk country and you have not received a shot to protect against hepatitis B (hepatitis B vaccine).  You have HIV or AIDS.  You use needles to  inject street drugs.  You live with, or have sex with, someone who has hepatitis B.  You are a man who has sex with other men (MSM).  You get hemodialysis treatment.  You take certain medicines for conditions such as cancer, organ transplantation, and autoimmune conditions.  Hepatitis C blood testing is recommended for all people born from [birth year] through 1965 and any individual with known risks for hepatitis C.  Practice safe sex. Use condoms and avoid high-risk sexual practices to reduce the spread of sexually transmitted infections (STIs). STIs include gonorrhea, chlamydia, syphilis, trichomonas, herpes, HPV, and human immunodeficiency virus (HIV). Herpes, HIV, and HPV are viral illnesses that have no cure. They can result in disability, cancer, and death.  If you are a man who has sex with other men, you should be screened at least once per year for:  HIV.  Urethral, rectal, and pharyngeal infection of gonorrhea, chlamydia,  or both.  If you are at risk of being infected with HIV, it is recommended that you take a prescription medicine daily to prevent HIV infection. This is called preexposure prophylaxis (PrEP). You are considered at risk if:  You are a man who has sex with other men (MSM) and have other risk factors.  You are a heterosexual man, are sexually active, and are at increased risk for HIV infection.  You take drugs by injection.  You are sexually active with a partner who has HIV.  Talk with your health care provider about whether you are at high risk of being infected with HIV. If you choose to begin PrEP, you should first be tested for HIV. You should then be tested every 3 months for as long as you are taking PrEP.  A one-time screening for abdominal aortic aneurysm (AAA) and surgical repair of large AAAs by ultrasound are recommended for men ages 66 to 48 years who are current or former smokers.  Healthy men should no longer receive prostate-specific antigen (PSA)  blood tests as part of routine cancer screening. Talk with your health care provider about prostate cancer screening.  Testicular cancer screening is not recommended for adult males who have no symptoms. Screening includes self-exam, a health care provider exam, and other screening tests. Consult with your health care provider about any symptoms you have or any concerns you have about testicular cancer.  Use sunscreen. Apply sunscreen liberally and repeatedly throughout the day. You should seek shade when your shadow is shorter than you. Protect yourself by wearing long sleeves, pants, a wide-brimmed hat, and sunglasses year round, whenever you are outdoors.  Once a month, do a whole-body skin exam, using a mirror to look at the skin on your back. Tell your health care provider about new moles, moles that have irregular borders, moles that are larger than a pencil eraser, or moles that have changed in shape or color.  Stay current with required vaccines (immunizations).  Influenza vaccine. All adults should be immunized every year.  Tetanus, diphtheria, and acellular pertussis (Td, Tdap) vaccine. An adult who has not previously received Tdap or who does not know his vaccine status should receive 1 dose of Tdap. This initial dose should be followed by tetanus and diphtheria toxoids (Td) booster doses every 10 years. Adults with an unknown or incomplete history of completing a 3-dose immunization series with Td-containing vaccines should begin or complete a primary immunization series including a Tdap dose. Adults should receive a Td booster every 10 years.  Varicella vaccine. An adult without evidence of immunity to varicella should receive 2 doses or a second dose if he has previously received 1 dose.  Human papillomavirus (HPV) vaccine. Males aged 11-21 years who have not received the vaccine previously should receive the 3-dose series. Males aged 22-26 years may be immunized. Immunization is  recommended through the age of 66 years for any male who has sex with males and did not get any or all doses earlier. Immunization is recommended for any person with an immunocompromised condition through the age of 26 years if he did not get any or all doses earlier. During the 3-dose series, the second dose should be obtained 4-8 weeks after the first dose. The third dose should be obtained 24 weeks after the first dose and 16 weeks after the second dose.  Zoster vaccine. One dose is recommended for adults aged 34 years or older unless certain conditions are present.  Measles, mumps,  and rubella (MMR) vaccine. Adults born before [birth year] generally are considered immune to measles and mumps. Adults born in [birth year] or later should have 1 or more doses of MMR vaccine unless there is a contraindication to the vaccine or there is laboratory evidence of immunity to each of the three diseases. A routine second dose of MMR vaccine should be obtained at least 28 days after the first dose for students attending postsecondary schools, health care workers, or international travelers. People who received inactivated measles vaccine or an unknown type of measles vaccine during 1963-1967 should receive 2 doses of MMR vaccine. People who received inactivated mumps vaccine or an unknown type of mumps vaccine before 1979 and are at high risk for mumps infection should consider immunization with 2 doses of MMR vaccine. Unvaccinated health care workers born before [birth year] who lack laboratory evidence of measles, mumps, or rubella immunity or laboratory confirmation of disease should consider measles and mumps immunization with 2 doses of MMR vaccine or rubella immunization with 1 dose of MMR vaccine.  Pneumococcal 13-valent conjugate (PCV13) vaccine. When indicated, a person who is uncertain of his immunization history and has no record of immunization should receive the PCV13 vaccine. All adults 45 years of age and older should receive  this vaccine. An adult aged 83 years or older who has certain medical conditions and has not been previously immunized should receive 1 dose of PCV13 vaccine. This PCV13 should be followed with a dose of pneumococcal polysaccharide (PPSV23) vaccine. Adults who are at high risk for pneumococcal disease should obtain the PPSV23 vaccine at least 8 weeks after the dose of PCV13 vaccine. Adults older than 78 years of age who have normal immune system function should obtain the PPSV23 vaccine dose at least 1 year after the dose of PCV13 vaccine.  Pneumococcal polysaccharide (PPSV23) vaccine. When PCV13 is also indicated, PCV13 should be obtained first. All adults aged 50 years and older should be immunized. An adult younger than age 70 years who has certain medical conditions should be immunized. Any person who resides in a nursing home or long-term care facility should be immunized. An adult smoker should be immunized. People with an immunocompromised condition and certain other conditions should receive both PCV13 and PPSV23 vaccines. People with human immunodeficiency virus (HIV) infection should be immunized as soon as possible after diagnosis. Immunization during chemotherapy or radiation therapy should be avoided. Routine use of PPSV23 vaccine is not recommended for American Indians, [address], or people younger than 65 years unless there are medical conditions that require PPSV23 vaccine. When indicated, people who have unknown immunization and have no record of immunization should receive PPSV23 vaccine. One-time revaccination 5 years after the first dose of PPSV23 is recommended for people aged 19-64 years who have chronic kidney failure, nephrotic syndrome, asplenia, or immunocompromised conditions. People who received 1-2 doses of PPSV23 before age 9 years should receive another dose of PPSV23 vaccine at age 19 years or later if at least 5 years have passed since the previous dose. Doses of PPSV23 are  not needed for people immunized with PPSV23 at or after age 42 years.  Meningococcal vaccine. Adults with asplenia or persistent complement component deficiencies should receive 2 doses of quadrivalent meningococcal conjugate (MenACWY-D) vaccine. The doses should be obtained at least 2 months apart. Microbiologists working with certain meningococcal bacteria, military recruits, people at risk during an outbreak, and people who travel to or live in countries with a high rate of meningitis should be  immunized. A first-year college student up through age 33 years who is living in a residence hall should receive a dose if he did not receive a dose on or after his 16th birthday. Adults who have certain high-risk conditions should receive one or more doses of vaccine.  Hepatitis A vaccine. Adults who wish to be protected from this disease, have chronic liver disease, work with hepatitis A-infected animals, work in hepatitis A research labs, or travel to or work in countries with a high rate of hepatitis A should be immunized. Adults who were previously unvaccinated and who anticipate close contact with an international adoptee during the first 60 days after arrival in the [place] from a country with a high rate of hepatitis A should be immunized.  Hepatitis B vaccine. Adults should be immunized if they wish to be protected from this disease, are under age 62 years and have diabetes, have chronic liver disease, have had more than one sex partner in the past 6 months, may be exposed to blood or other infectious body fluids, are household contacts or sex partners of hepatitis B positive people, are clients or workers in certain care facilities, or travel to or work in countries with a high rate of hepatitis B.  Haemophilus influenzae type b (Hib) vaccine. A previously unvaccinated person with asplenia or sickle cell disease or having a scheduled splenectomy should receive 1 dose of Hib vaccine. Regardless of  previous immunization, a recipient of a hematopoietic stem cell transplant should receive a 3-dose series 6-12 months after his successful transplant. Hib vaccine is not recommended for adults with HIV infection. Preventive Service / Frequency Ages 24 to 79  Blood pressure check.** / Every 3-5 years.  Lipid and cholesterol check.** / Every 5 years beginning at age 74.  Hepatitis C blood test.** / For any individual with known risks for hepatitis C.  Skin self-exam. / Monthly.  Influenza vaccine. / Every year.  Tetanus, diphtheria, and acellular pertussis (Tdap, Td) vaccine.** / Consult your health care provider. 1 dose of Td every 10 years.  Varicella vaccine.** / Consult your health care provider.  HPV vaccine. / 3 doses over 6 months, if 60 or younger.  Measles, mumps, rubella (MMR) vaccine.** / You need at least 1 dose of MMR if you were born in 1957 or later. You may also need a second dose.  Pneumococcal 13-valent conjugate (PCV13) vaccine.** / Consult your health care provider.  Pneumococcal polysaccharide (PPSV23) vaccine.** / 1 to 2 doses if you smoke cigarettes or if you have certain conditions.  Meningococcal vaccine.** / 1 dose if you are age 68 to 47 years and a Market researcher living in a residence hall, or have one of several medical conditions. You may also need additional booster doses.  Hepatitis A vaccine.** / Consult your health care provider.  Hepatitis B vaccine.** / Consult your health care provider.  Haemophilus influenzae type b (Hib) vaccine.** / Consult your health care provider. Ages 72 to 51  Blood pressure check.** / Every year.  Lipid and cholesterol check.** / Every 5 years beginning at age 59.  Lung cancer screening. / Every year if you are aged 29-80 years and have a 30-pack-year history of smoking and currently smoke or have quit within the past 15 years. Yearly screening is stopped once you have quit smoking for at least 15 years or  develop a health problem that would prevent you from having lung cancer treatment.  Fecal occult blood test (FOBT)  of stool. / Every year beginning at age 5 and continuing until age 82. You may not have to do this test if you get a colonoscopy every 10 years.  Flexible sigmoidoscopy** or colonoscopy.** / Every 5 years for a flexible sigmoidoscopy or every 10 years for a colonoscopy beginning at age 50 and continuing until age 37.  Hepatitis C blood test.** / For all people born from [birth year] through 1965 and any individual with known risks for hepatitis C.  Skin self-exam. / Monthly.  Influenza vaccine. / Every year.  Tetanus, diphtheria, and acellular pertussis (Tdap/Td) vaccine.** / Consult your health care provider. 1 dose of Td every 10 years.  Varicella vaccine.** / Consult your health care provider.  Zoster vaccine.** / 1 dose for adults aged 5 years or older.  Measles, mumps, rubella (MMR) vaccine.** / You need at least 1 dose of MMR if you were born in 1955 or later. You may also need a second dose.  Pneumococcal 13-valent conjugate (PCV13) vaccine.** / Consult your health care provider.  Pneumococcal polysaccharide (PPSV23) vaccine.** / 1 to 2 doses if you smoke cigarettes or if you have certain conditions.  Meningococcal vaccine.** / Consult your health care provider.  Hepatitis A vaccine.** / Consult your health care provider.  Hepatitis B vaccine.** / Consult your health care provider.  Haemophilus influenzae type b (Hib) vaccine.** / Consult your health care provider. Ages 63 and over  Blood pressure check.** / Every year.  Lipid and cholesterol check.**/ Every 5 years beginning at age 2.  Lung cancer screening. / Every year if you are aged 36-80 years and have a 30-pack-year history of smoking and currently smoke or have quit within the past 15 years. Yearly screening is stopped once you have quit smoking for at least 15 years or develop a health problem that would  prevent you from having lung cancer treatment.  Fecal occult blood test (FOBT) of stool. / Every year beginning at age 101 and continuing until age 53. You may not have to do this test if you get a colonoscopy every 10 years.  Flexible sigmoidoscopy** or colonoscopy.** / Every 5 years for a flexible sigmoidoscopy or every 10 years for a colonoscopy beginning at age 44 and continuing until age 30.  Hepatitis C blood test.** / For all people born from [birth year] through 1963 and any individual with known risks for hepatitis C.  Abdominal aortic aneurysm (AAA) screening.** / A one-time screening for ages 66 to 58 years who are current or former smokers.  Skin self-exam. / Monthly.  Influenza vaccine. / Every year.  Tetanus, diphtheria, and acellular pertussis (Tdap/Td) vaccine.** / 1 dose of Td every 10 years.  Varicella vaccine.** / Consult your health care provider.  Zoster vaccine.** / 1 dose for adults aged 65 years or older.  Pneumococcal 13-valent conjugate (PCV13) vaccine.** / 1 dose for all adults aged 3 years and older.  Pneumococcal polysaccharide (PPSV23) vaccine.** / 1 dose for all adults aged 74 years and older.  Meningococcal vaccine.** / Consult your health care provider.  Hepatitis A vaccine.** / Consult your health care provider.  Hepatitis B vaccine.** / Consult your health care provider.  Haemophilus influenzae type b (Hib) vaccine.** / Consult your health care provider. **Family history and personal history of risk and conditions may change your health care provider's recommendations.   This information is not intended to replace advice given to you by your health care provider. Make sure you discuss any questions you have with  your health care provider.   Document Released: 05/14/2001 Document Revised: 04/08/2014 Document Reviewed: 08/13/2010 Elsevier Interactive Patient Education [employer].

## 2015-11-24 NOTE — Progress Notes (Signed)
Pre visit review using our clinic review tool, if applicable. No additional management support is needed unless otherwise documented below in the visit note. 

## 2015-12-05 LAB — HM COLONOSCOPY

## 2015-12-07 ENCOUNTER — Telehealth: Payer: Self-pay | Admitting: Family Medicine

## 2015-12-07 NOTE — Telephone Encounter (Signed)
Pt dropped off documents for PCP to look at and have the copies on pt's chart. Pt states documents has information for PCP to consider for any changes regarding about meds. (document in a big yellow envelope - Upper Endoscopy Discharge Instructions and Colonoscopy Discharge Instructions from Dr Barbaraann Faster)

## 2015-12-07 NOTE — Telephone Encounter (Signed)
Records received and forwarded to PCP.

## 2015-12-10 ENCOUNTER — Encounter: Payer: Self-pay | Admitting: Family Medicine

## 2015-12-10 DIAGNOSIS — Z0000 Encounter for general adult medical examination without abnormal findings: Secondary | ICD-10-CM | POA: Insufficient documentation

## 2015-12-10 NOTE — Progress Notes (Signed)
Patient ID: [name] White, male   DOB: November 26, 1935, 80 y.o.   MRN: [id number]   Subjective:    Patient ID: [name] White, male    DOB: 01/31/1936, 80 y.o.   MRN: [id number]  Chief Complaint  Patient presents with  . Annual Exam    fasting,    HPI Patient is in today for annual preventative exam and follow up on numerous conditions. He is noting he is seeking some of his care at the [place] and reports a hgba1c 6.7 on August 31, 2015. Denies polyuria or polydipsia. Has been struggling with right 3 rd finger pain and following with orthopaedist. He has given a stool sample to [place] which was negative for blood. Denies CP/palp/SOB/HA/congestion/fevers/GI or GU c/o. Taking meds as prescribed  Past Medical History:  Diagnosis Date  . Anemia 06/04/2015  . Anxiety   . Atrial flutter ([place])    s/p ablation 07/17/08; echo EF 60-65% trivival AI  . Bradycardia   . Chicken pox as a child  . Complication of anesthesia    "during ablation-heard people talking and he was groaning"  . Coronary artery disease    "slight"  . Diabetes mellitus type 2 in obese [facility]) 04/10/2009   Qualifier: Diagnosis of  By: Arnoldo Morale MD, Balinda Quails   . Dysrhythmia    a-fib.   . Emphysema of lung ([place])   . GERD (gastroesophageal reflux disease)   . Glucose intolerance (impaired glucose tolerance)   . H/O hiatal hernia   . Heart murmur    "slight"  . Hepatitis    h/o of type A  . Hypertension   . Hypothyroidism   . Measles as a child  . Micturition syncope   . Mumps as a child  . Osteoarthritis   . Preventative health care 12/10/2015  . Sleep apnea   . Vitamin D deficiency 11/24/2015    Past Surgical History:  Procedure Laterality Date  . CARDIAC CATHETERIZATION    . CARDIAC ELECTROPHYSIOLOGY MAPPING AND ABLATION  06/2008  . CATARACT EXTRACTION W/ INTRAOCULAR LENS  IMPLANT, BILATERAL  ~ 2007  . COLONOSCOPY    . FOOT TENDON SURGERY Left   . TONSILLECTOMY  1940's  . TOTAL KNEE ARTHROPLASTY  1990's   right  . TOTAL KNEE  ARTHROPLASTY Left 08/02/2013   Procedure: LEFT TOTAL KNEE ARTHROPLASTY;  Surgeon: Gearlean Alf, MD;  Location: [facility];  Service: Orthopedics;  Laterality: Left;  . WISDOM TOOTH EXTRACTION      Family History  Problem Relation Age of Onset  . Multiple sclerosis Father   . Heart disease Mother     atrial fibrillation  . Hypertension Son   . Heart disease Maternal Grandfather     Social History   Social History  . Marital status: Married    Spouse name: N/A  . Number of children: N/A  . Years of education: N/A   Occupational History  . Not on file.   Social History Main Topics  . Smoking status: Former Smoker    Years: 0.00    Types: Pipe, Cigars    Quit date: 04/01/1996  . Smokeless tobacco: Former Systems developer    Types: Chew    Quit date: 04/01/2009  . Alcohol use 0.0 oz/week     Comment: red wine x2 daily  . Drug use: No  . Sexual activity: Not Currently     Comment: lives with wife, eats clean diet. No major dietary restrtictions,    Other Topics  Concern  . Not on file   Social History Narrative  . No narrative on file    Outpatient Medications Prior to Visit  Medication Sig Dispense Refill  . amLODipine (NORVASC) 10 MG tablet Take 1 tablet (10 mg total) by mouth every morning. 90 tablet 3  . apixaban (ELIQUIS) 5 MG TABS tablet Take 1 tablet (5 mg total) by mouth 2 (two) times daily. 180 tablet 3  . cloNIDine (CATAPRES) 0.3 MG tablet Take 1 tablet (0.3 mg total) by mouth 2 (two) times daily. 190 tablet 1  . Coenzyme Q10 (COQ10 PO) Take by mouth.    . dofetilide (TIKOSYN) 500 MCG capsule Take 1 capsule (500 mcg total) by mouth every 12 (twelve) hours. 60 capsule 6  . fenofibrate (TRICOR) 145 MG tablet TAKE 1 TABLET (145 MG TOTAL) BY MOUTH DAILY. 90 tablet 1  . finasteride (PROSCAR) 5 MG tablet Take 5 mg by mouth daily.     [name] levothyroxine (SYNTHROID, LEVOTHROID) 150 MCG tablet Take 1 by mouth daily for 5 days a week and 2 by mouth the other two days a week. 108 tablet 1    . Multiple Vitamins-Minerals (CENTRUM ADULTS PO) Take by mouth.    [name] omeprazole (PRILOSEC) 20 MG capsule Take 20 mg by mouth daily.     [name] PARoxetine (PAXIL) 40 MG tablet Take 1 tablet (40 mg total) by mouth every morning. 90 tablet 1  . cephALEXin (KEFLEX) 500 MG capsule Take 1 capsule (500 mg total) by mouth 3 (three) times daily. 30 capsule 0   No facility-administered medications prior to visit.     Allergies  Allergen Reactions  . Codeine Itching and Rash  . Statins Other (See Comments)    Made very sick, doctor thought had heart attack; "my appearance, color, etc"  . Ace Inhibitors   . Benazepril   . Piroxicam Hives  . Pseudoephedrine     Can't take because it interacts with dofetilide     Review of Systems  Constitutional: Positive for malaise/fatigue. Negative for chills and fever.  HENT: Negative for congestion and hearing loss.   Eyes: Negative for discharge.  Respiratory: Negative for cough, sputum production and shortness of breath.   Cardiovascular: Negative for chest pain, palpitations and leg swelling.  Gastrointestinal: Negative for abdominal pain, blood in stool, constipation, diarrhea, heartburn, nausea and vomiting.  Genitourinary: Negative for dysuria, frequency, hematuria and urgency.  Musculoskeletal: Negative for back pain, falls, joint pain and myalgias.  Skin: Negative for rash.  Neurological: Negative for dizziness, sensory change, loss of consciousness, weakness and headaches.  Endo/Heme/Allergies: Negative for environmental allergies. Does not bruise/bleed easily.  Psychiatric/Behavioral: Negative for depression and suicidal ideas. The patient is not nervous/anxious and does not have insomnia.        Objective:    Physical Exam  Constitutional: He is oriented to person, place, and time. He appears well-developed and well-nourished. No distress.  HENT:  Head: Normocephalic and atraumatic.  Eyes: Conjunctivae are normal.  Neck: Neck supple. No  thyromegaly present.  Cardiovascular: Normal rate and normal heart sounds.   Pulmonary/Chest: Effort normal and breath sounds normal. No respiratory distress. He has no wheezes.  Abdominal: Soft. Bowel sounds are normal. He exhibits no mass. There is no tenderness.  Musculoskeletal: He exhibits no edema.  Lymphadenopathy:    He has no cervical adenopathy.  Neurological: He is alert and oriented to person, place, and time.  Skin: Skin is warm and dry.  Psychiatric: He has a normal  mood and affect. His behavior is normal.    BP 118/70 (BP Location: Left Arm, Patient Position: Sitting, Cuff Size: Normal)   Pulse (!) 54   Temp 98.2 F (36.8 C)   Ht 6\' 2"  (1.88 m)   Wt 264 lb 12.8 oz (120.1 kg)   SpO2 95%   BMI 34.00 kg/m  Wt Readings from Last 3 Encounters:  11/24/15 264 lb 12.8 oz (120.1 kg)  05/26/15 266 lb (120.7 kg)  10/18/14 259 lb 8 oz (117.7 kg)     Lab Results  Component Value Date   WBC 5.6 05/26/2015   HGB 12.9 (L) 05/26/2015   HCT 40.1 05/26/2015   PLT 232 05/26/2015   GLUCOSE 93 05/26/2015   CHOL 187 05/26/2015   TRIG 69 05/26/2015   HDL 82 05/26/2015   LDLDIRECT 141.6 11/12/2010   LDLCALC 91 05/26/2015   ALT 23 05/26/2015   AST 36 (H) 05/26/2015   NA 138 05/26/2015   K 3.9 05/26/2015   CL 103 05/26/2015   CREATININE 1.16 05/26/2015   BUN 18 05/26/2015   CO2 22 05/26/2015   TSH 2.53 05/26/2015   PSA 1.42 09/13/2013   INR 1.01 07/26/2013   HGBA1C 7.0 (H) 05/26/2015   MICROALBUR 5.6 05/26/2015    Lab Results  Component Value Date   TSH 2.53 05/26/2015   Lab Results  Component Value Date   WBC 5.6 05/26/2015   HGB 12.9 (L) 05/26/2015   HCT 40.1 05/26/2015   MCV 85.1 05/26/2015   PLT 232 05/26/2015   Lab Results  Component Value Date   NA 138 05/26/2015   K 3.9 05/26/2015   CO2 22 05/26/2015   GLUCOSE 93 05/26/2015   BUN 18 05/26/2015   CREATININE 1.16 05/26/2015   BILITOT 0.5 05/26/2015   ALKPHOS 34 (L) 05/26/2015   AST 36 (H)  05/26/2015   ALT 23 05/26/2015   PROT 7.2 05/26/2015   ALBUMIN 4.3 05/26/2015   CALCIUM 9.4 05/26/2015   GFR 58.99 (L) 06/03/2014   Lab Results  Component Value Date   CHOL 187 05/26/2015   Lab Results  Component Value Date   HDL 82 05/26/2015   Lab Results  Component Value Date   LDLCALC 91 05/26/2015   Lab Results  Component Value Date   TRIG 69 05/26/2015   Lab Results  Component Value Date   CHOLHDL 2.3 05/26/2015   Lab Results  Component Value Date   HGBA1C 7.0 (H) 05/26/2015       Assessment & Plan:   Problem List Items Addressed This Visit    Hypothyroidism - Primary    On Levothyroxine, continue to monitor      Relevant Medications   levothyroxine (SYNTHROID, LEVOTHROID) 175 MCG tablet   Other Relevant Orders   TSH   Hyperlipidemia    Tolerating statin, encouraged heart healthy diet, avoid trans fats, minimize simple carbs and saturated fats. Increase exercise as tolerated      Relevant Orders   Lipid panel   Essential hypertension    Well controlled, no changes to meds. Encouraged heart healthy diet such as the DASH diet and exercise as tolerated.       Relevant Orders   CBC   TSH   Comprehensive metabolic panel   Diabetes mellitus type 2 in obese (HCC)    hgba1c acceptable, minimize simple carbs. Increase exercise as tolerated. Continue current meds      Relevant Orders   Hemoglobin A1c   Hemoglobin A1c  Atrial fibrillation (HCC)    Tolerating Eliquis      Anemia   Relevant Orders   CBC   Vitamin D deficiency    Maintain supplements and monitor      Relevant Orders   VITAMIN D 25 Hydroxy (Vit-D Deficiency, Fractures)   Preventative health care    Patient encouraged to maintain heart healthy diet, regular exercise, adequate sleep. Consider daily probiotics. Take medications as prescribed. Given and reviewed copy of ACP documents from [place] and encouraged to complete and return       Other Visit Diagnoses     Sun-damaged skin       Relevant Orders   Ambulatory referral to Dermatology   Encounter for immunization       Relevant Orders   Flu vaccine HIGH DOSE PF (Completed)      I have discontinued Mr. Jolaine Click cephALEXin. I am also having him start on levothyroxine and Vitamin D (Ergocalciferol). Additionally, I am having him maintain his omeprazole, finasteride, dofetilide, PARoxetine, Coenzyme Q10 (COQ10 PO), Multiple Vitamins-Minerals (CENTRUM ADULTS PO), cloNIDine, levothyroxine, apixaban, amLODipine, fenofibrate, and HYDROcodone-acetaminophen.  Meds ordered this encounter  Medications  . HYDROcodone-acetaminophen (NORCO/VICODIN) 5-325 MG tablet    Sig: Take 1 tablet by mouth daily as needed.  [name] levothyroxine (SYNTHROID, LEVOTHROID) 175 MCG tablet    Sig: Take 1 tablet (175 mcg total) by mouth daily before breakfast.    Dispense:  90 tablet    Refill:  1  . Vitamin D, Ergocalciferol, (DRISDOL) 50000 units CAPS capsule    Sig: Take 1 capsule (50,000 Units total) by mouth every 7 (seven) days.    Dispense:  12 capsule    Refill:  1     Patti Shorb, MD

## 2015-12-10 NOTE — Assessment & Plan Note (Signed)
Well controlled, no changes to meds. Encouraged heart healthy diet such as the DASH diet and exercise as tolerated.  °

## 2015-12-10 NOTE — Assessment & Plan Note (Signed)
hgba1c acceptable, minimize simple carbs. Increase exercise as tolerated. Continue current meds 

## 2015-12-10 NOTE — Assessment & Plan Note (Signed)
On Levothyroxine, continue to monitor 

## 2015-12-10 NOTE — Assessment & Plan Note (Signed)
Tolerating Eliquis 

## 2015-12-10 NOTE — Assessment & Plan Note (Signed)
Patient encouraged to maintain heart healthy diet, regular exercise, adequate sleep. Consider daily probiotics. Take medications as prescribed. Given and reviewed copy of ACP documents from [place] Secretary of State and encouraged to complete and return 

## 2015-12-10 NOTE — Assessment & Plan Note (Signed)
Tolerating statin, encouraged heart healthy diet, avoid trans fats, minimize simple carbs and saturated fats. Increase exercise as tolerated 

## 2015-12-10 NOTE — Assessment & Plan Note (Signed)
Maintain supplements and monitor

## 2015-12-12 ENCOUNTER — Encounter: Payer: Self-pay | Admitting: Family Medicine

## 2015-12-15 ENCOUNTER — Encounter: Payer: Self-pay | Admitting: Family Medicine

## 2015-12-30 ENCOUNTER — Other Ambulatory Visit (HOSPITAL_COMMUNITY): Payer: Self-pay | Admitting: Internal Medicine

## 2015-12-30 DIAGNOSIS — I5022 Chronic systolic (congestive) heart failure: Secondary | ICD-10-CM

## 2016-01-08 DIAGNOSIS — L989 Disorder of the skin and subcutaneous tissue, unspecified: Secondary | ICD-10-CM | POA: Diagnosis not present

## 2016-03-21 ENCOUNTER — Other Ambulatory Visit: Payer: Self-pay | Admitting: Family Medicine

## 2016-03-22 ENCOUNTER — Other Ambulatory Visit: Payer: Self-pay | Admitting: Family Medicine

## 2016-03-28 ENCOUNTER — Ambulatory Visit (INDEPENDENT_AMBULATORY_CARE_PROVIDER_SITE_OTHER): Payer: Medicare Other | Admitting: Family Medicine

## 2016-03-28 ENCOUNTER — Encounter: Payer: Self-pay | Admitting: Family Medicine

## 2016-03-28 DIAGNOSIS — E559 Vitamin D deficiency, unspecified: Secondary | ICD-10-CM

## 2016-03-28 DIAGNOSIS — D649 Anemia, unspecified: Secondary | ICD-10-CM

## 2016-03-28 DIAGNOSIS — E1169 Type 2 diabetes mellitus with other specified complication: Secondary | ICD-10-CM | POA: Diagnosis not present

## 2016-03-28 DIAGNOSIS — E039 Hypothyroidism, unspecified: Secondary | ICD-10-CM

## 2016-03-28 DIAGNOSIS — E785 Hyperlipidemia, unspecified: Secondary | ICD-10-CM

## 2016-03-28 DIAGNOSIS — E669 Obesity, unspecified: Secondary | ICD-10-CM

## 2016-03-28 DIAGNOSIS — I480 Paroxysmal atrial fibrillation: Secondary | ICD-10-CM

## 2016-03-28 DIAGNOSIS — I10 Essential (primary) hypertension: Secondary | ICD-10-CM

## 2016-03-28 LAB — LIPID PANEL
Cholesterol: 185 mg/dL (ref 0–200)
HDL: 77.3 mg/dL (ref >39.00)
LDL Cholesterol: 94 mg/dL (ref 0–99)
NonHDL: 108.08
TRIGLYCERIDES: 70 mg/dL (ref 0.0–149.0)
Total CHOL/HDL Ratio: 2
VLDL: 14 mg/dL (ref 0.0–40.0)

## 2016-03-28 LAB — COMPREHENSIVE METABOLIC PANEL
ALBUMIN: 4.4 g/dL (ref 3.5–5.2)
ALK PHOS: 38 U/L — AB (ref 39–117)
ALT: 20 U/L (ref 0–53)
AST: 31 U/L (ref 0–37)
BILIRUBIN TOTAL: 0.5 mg/dL (ref 0.2–1.2)
BUN: 21 mg/dL (ref 6–23)
CALCIUM: 9.7 mg/dL (ref 8.4–10.5)
CO2: 32 meq/L (ref 19–32)
CREATININE: 1.12 mg/dL (ref 0.40–1.50)
Chloride: 104 mEq/L (ref 96–112)
GFR: 67.25 mL/min (ref >60.00)
Glucose, Bld: 121 mg/dL — ABNORMAL HIGH (ref 70–99)
Potassium: 4.3 mEq/L (ref 3.5–5.1)
Sodium: 139 mEq/L (ref 135–145)
TOTAL PROTEIN: 7.1 g/dL (ref 6.0–8.3)

## 2016-03-28 LAB — CBC
HCT: 40 % (ref 39.0–52.0)
HEMOGLOBIN: 13.5 g/dL (ref 13.0–17.0)
MCHC: 33.7 g/dL (ref 30.0–36.0)
MCV: 86.7 fl (ref 78.0–100.0)
PLATELETS: 194 10*3/uL (ref 150.0–400.0)
RBC: 4.61 Mil/uL (ref 4.22–5.81)
RDW: 16.7 % — ABNORMAL HIGH (ref 11.5–15.5)
WBC: 5.1 10*3/uL (ref 4.0–10.5)

## 2016-03-28 LAB — HEMOGLOBIN A1C: Hgb A1c MFr Bld: 6.5 % (ref 4.6–6.5)

## 2016-03-28 LAB — VITAMIN D 25 HYDROXY (VIT D DEFICIENCY, FRACTURES): VITD: 33.79 ng/mL (ref 30.00–100.00)

## 2016-03-28 LAB — TSH: TSH: 3.32 u[IU]/mL (ref 0.35–4.50)

## 2016-03-28 NOTE — Progress Notes (Signed)
Pre visit review using our clinic review tool, if applicable. No additional management support is needed unless otherwise documented below in the visit note. 

## 2016-03-28 NOTE — Patient Instructions (Signed)
Food Choices for Gastroesophageal Reflux Disease, Adult When you have gastroesophageal reflux disease (GERD), the foods you eat and your eating habits are very important. Choosing the right foods can help ease your discomfort. What guidelines do I need to follow?  Choose fruits, vegetables, whole grains, and low-fat dairy products.  Choose low-fat meat, fish, and poultry.  Limit fats such as oils, salad dressings, butter, nuts, and avocado.  Keep a food diary. This helps you identify foods that cause symptoms.  Avoid foods that cause symptoms. These may be different for everyone.  Eat small meals often instead of 3 large meals a day.  Eat your meals slowly, in a place where you are relaxed.  Limit fried foods.  Cook foods using methods other than frying.  Avoid drinking alcohol.  Avoid drinking large amounts of liquids with your meals.  Avoid bending over or lying down until 2-3 hours after eating. What foods are not recommended? These are some foods and drinks that may make your symptoms worse: Vegetables  Tomatoes. Tomato juice. Tomato and spaghetti sauce. Chili peppers. Onion and garlic. Horseradish. Fruits  Oranges, grapefruit, and lemon (fruit and juice). Meats  High-fat meats, fish, and poultry. This includes hot dogs, ribs, ham, sausage, salami, and bacon. Dairy  Whole milk and chocolate milk. Sour cream. Cream. Butter. Ice cream. Cream cheese. Drinks  Coffee and tea. Bubbly (carbonated) drinks or energy drinks. Condiments  Hot sauce. Barbecue sauce. Sweets/Desserts  Chocolate and cocoa. Donuts. Peppermint and spearmint. Fats and Oils  High-fat foods. This includes French fries and potato chips. Other  Vinegar. Strong spices. This includes black pepper, white pepper, red pepper, cayenne, curry powder, cloves, ginger, and chili powder. The items listed above may not be a complete list of foods and drinks to avoid. Contact your dietitian for more information.    This information is not intended to replace advice given to you by your health care provider. Make sure you discuss any questions you have with your health care provider. Document Released: 09/17/2011 Document Revised: 08/24/2015 Document Reviewed: 01/20/2013 Elsevier Interactive Patient Education  2017 [employer]  

## 2016-03-28 NOTE — Assessment & Plan Note (Signed)
On Levothyroxine, continue to monitor 

## 2016-03-28 NOTE — Assessment & Plan Note (Signed)
Vitamin d weekly at present, check levels today

## 2016-03-28 NOTE — Assessment & Plan Note (Signed)
Well controlled, no changes to meds. Encouraged heart healthy diet such as the DASH diet and exercise as tolerated.  °

## 2016-03-28 NOTE — Assessment & Plan Note (Signed)
Tolerating Eliquis, rate controlled 

## 2016-03-28 NOTE — Assessment & Plan Note (Signed)
hgba1c acceptable, minimize simple carbs. Increase exercise as tolerated. Continue current meds 

## 2016-03-28 NOTE — Assessment & Plan Note (Signed)
Encouraged heart healthy diet, increase exercise, avoid trans fats, consider a krill oil cap daily 

## 2016-03-28 NOTE — Assessment & Plan Note (Addendum)
Was placed on iron for a couple weeks but it caused constipation so he stopped it completely a couple of weeks ago. Had an EGD and colonoscopy at [facility] that were unremarkable for a source of bleeding but none found. He had some polyps that they did not remove them due to Eliquis, eating increased leafy

## 2016-04-03 DIAGNOSIS — Z9841 Cataract extraction status, right eye: Secondary | ICD-10-CM | POA: Diagnosis not present

## 2016-04-03 DIAGNOSIS — E119 Type 2 diabetes mellitus without complications: Secondary | ICD-10-CM | POA: Diagnosis not present

## 2016-04-03 DIAGNOSIS — Z9842 Cataract extraction status, left eye: Secondary | ICD-10-CM | POA: Diagnosis not present

## 2016-04-03 DIAGNOSIS — H53413 Scotoma involving central area, bilateral: Secondary | ICD-10-CM | POA: Diagnosis not present

## 2016-04-03 DIAGNOSIS — Z961 Presence of intraocular lens: Secondary | ICD-10-CM | POA: Diagnosis not present

## 2016-04-03 NOTE — Progress Notes (Signed)
Patient ID: [name] White, male   DOB: 06/02/1935, 81 y.o.   MRN: [id number]   Subjective:    Patient ID: [name] White, male    DOB: March 17, 1936, 81 y.o.   MRN: [id number]  Chief Complaint  Patient presents with  . Follow-up    HPI Patient is in today for follow up. He feels well today. No recent hospitalization or acute febrile illness. He is trying to minimize carbohydrate intake and stay active. No polyuria or polydipsia. Denies CP/palp/SOB/HA/congestion/fevers/GI or GU c/o. Taking meds as prescribed  Past Medical History:  Diagnosis Date  . Anemia 06/04/2015  . Anxiety   . Atrial flutter ([place])    s/p ablation 07/17/08; echo EF 60-65% trivival AI  . Bradycardia   . Chicken pox as a child  . Complication of anesthesia    "during ablation-heard people talking and he was groaning"  . Coronary artery disease    "slight"  . Diabetes mellitus type 2 in obese [facility]) 04/10/2009   Qualifier: Diagnosis of  By: Arnoldo Morale MD, Balinda Quails   . Dysrhythmia    a-fib.   . Emphysema of lung ([place])   . GERD (gastroesophageal reflux disease)   . Glucose intolerance (impaired glucose tolerance)   . H/O hiatal hernia   . Heart murmur    "slight"  . Hepatitis    h/o of type A  . Hypertension   . Hypothyroidism   . Measles as a child  . Micturition syncope   . Mumps as a child  . Osteoarthritis   . Preventative health care 12/10/2015  . Sleep apnea   . Vitamin D deficiency 11/24/2015    Past Surgical History:  Procedure Laterality Date  . CARDIAC CATHETERIZATION    . CARDIAC ELECTROPHYSIOLOGY MAPPING AND ABLATION  06/2008  . CATARACT EXTRACTION W/ INTRAOCULAR LENS  IMPLANT, BILATERAL  ~ 2007  . COLONOSCOPY    . FOOT TENDON SURGERY Left   . TONSILLECTOMY  1940's  . TOTAL KNEE ARTHROPLASTY  1990's   right  . TOTAL KNEE ARTHROPLASTY Left 08/02/2013   Procedure: LEFT TOTAL KNEE ARTHROPLASTY;  Surgeon: Gearlean Alf, MD;  Location: [facility];  Service: Orthopedics;  Laterality: Left;  . WISDOM  TOOTH EXTRACTION      Family History  Problem Relation Age of Onset  . Multiple sclerosis Father   . Heart disease Mother     atrial fibrillation  . Hypertension Son   . Heart disease Maternal Grandfather     Social History   Social History  . Marital status: Married    Spouse name: N/A  . Number of children: N/A  . Years of education: N/A   Occupational History  . Not on file.   Social History Main Topics  . Smoking status: Former Smoker    Years: 0.00    Types: Pipe, Cigars    Quit date: 04/01/1996  . Smokeless tobacco: Former Systems developer    Types: Chew    Quit date: 04/01/2009  . Alcohol use 0.0 oz/week     Comment: red wine x2 daily  . Drug use: No  . Sexual activity: Not Currently     Comment: lives with wife, eats clean diet. No major dietary restrtictions,    Other Topics Concern  . Not on file   Social History Narrative  . No narrative on file    Outpatient Medications Prior to Visit  Medication Sig Dispense Refill  . amLODipine (NORVASC) 10 MG tablet Take 1  tablet (10 mg total) by mouth every morning. 90 tablet 3  . cloNIDine (CATAPRES) 0.3 MG tablet Take 1 tablet (0.3 mg total) by mouth 2 (two) times daily. 190 tablet 1  . Coenzyme Q10 (COQ10 PO) Take by mouth.    . dofetilide (TIKOSYN) 500 MCG capsule Take 1 capsule (500 mcg total) by mouth every 12 (twelve) hours. 60 capsule 6  . ELIQUIS 5 MG TABS tablet TAKE 1 TABLET BY MOUTH TWICE A DAY 180 tablet 3  . fenofibrate (TRICOR) 145 MG tablet TAKE 1 TABLET (145 MG TOTAL) BY MOUTH DAILY. 90 tablet 1  . fenofibrate (TRICOR) 145 MG tablet TAKE 1 TABLET (145 MG TOTAL) BY MOUTH DAILY. 90 tablet 1  . finasteride (PROSCAR) 5 MG tablet Take 5 mg by mouth daily.     [name] HYDROcodone-acetaminophen (NORCO/VICODIN) 5-325 MG tablet Take 1 tablet by mouth daily as needed.    [name] levothyroxine (SYNTHROID, LEVOTHROID) 175 MCG tablet Take 1 tablet (175 mcg total) by mouth daily before breakfast. 90 tablet 1  . Multiple  Vitamins-Minerals (CENTRUM ADULTS PO) Take by mouth.    [name] omeprazole (PRILOSEC) 20 MG capsule Take 20 mg by mouth daily.     [name] PARoxetine (PAXIL) 40 MG tablet Take 1 tablet (40 mg total) by mouth every morning. 90 tablet 1  . Vitamin D, Ergocalciferol, (DRISDOL) 50000 units CAPS capsule Take 1 capsule (50,000 Units total) by mouth every 7 (seven) days. 12 capsule 1  . fenofibrate (TRICOR) 145 MG tablet TAKE 1 TABLET (145 MG TOTAL) BY MOUTH DAILY. 90 tablet 1  . fenofibrate (TRICOR) 145 MG tablet TAKE 1 TABLET (145 MG TOTAL) BY MOUTH DAILY. 90 tablet 1  . levothyroxine (SYNTHROID, LEVOTHROID) 150 MCG tablet Take 1 by mouth daily for 5 days a week and 2 by mouth the other two days a week. 108 tablet 1   No facility-administered medications prior to visit.     Allergies  Allergen Reactions  . Codeine Itching and Rash  . Statins Other (See Comments)    Made very sick, doctor thought had heart attack; "my appearance, color, etc"  . Ace Inhibitors   . Benazepril   . Piroxicam Hives  . Pseudoephedrine     Can't take because it interacts with dofetilide     Review of Systems  Constitutional: Negative for fever and malaise/fatigue.  HENT: Negative for congestion.   Eyes: Negative for blurred vision.  Respiratory: Negative for shortness of breath.   Cardiovascular: Negative for chest pain, palpitations and leg swelling.  Gastrointestinal: Negative for abdominal pain, blood in stool and nausea.  Genitourinary: Negative for dysuria and frequency.  Musculoskeletal: Negative for falls.  Skin: Negative for rash.  Neurological: Negative for dizziness, loss of consciousness and headaches.  Endo/Heme/Allergies: Negative for environmental allergies.  Psychiatric/Behavioral: Negative for depression. The patient is not nervous/anxious.        Objective:    Physical Exam  Constitutional: He is oriented to person, place, and time. He appears well-developed and well-nourished. No distress.  HENT:    Head: Normocephalic and atraumatic.  Nose: Nose normal.  Eyes: Right eye exhibits no discharge. Left eye exhibits no discharge.  Neck: Normal range of motion. Neck supple.  Cardiovascular: Normal rate and regular rhythm.   No murmur heard. Pulmonary/Chest: Effort normal and breath sounds normal.  Abdominal: Soft. Bowel sounds are normal. There is no tenderness.  Musculoskeletal: He exhibits no edema.  Neurological: He is alert and oriented to person, place, and time.  Skin: Skin is warm and dry.  Psychiatric: He has a normal mood and affect.  Nursing note and vitals reviewed.   BP 130/78 (BP Location: Left Arm, Patient Position: Sitting, Cuff Size: Normal)   Pulse (!) 48   Temp 97.5 F (36.4 C) (Oral)   Ht 6\' 3"  (1.905 m)   Wt 245 lb 4 oz (111.2 kg)   SpO2 97%   BMI 30.65 kg/m  Wt Readings from Last 3 Encounters:  03/28/16 245 lb 4 oz (111.2 kg)  11/24/15 264 lb 12.8 oz (120.1 kg)  05/26/15 266 lb (120.7 kg)     Lab Results  Component Value Date   WBC 5.1 03/28/2016   HGB 13.5 03/28/2016   HCT 40.0 03/28/2016   PLT 194.0 03/28/2016   GLUCOSE 121 (H) 03/28/2016   CHOL 185 03/28/2016   TRIG 70.0 03/28/2016   HDL 77.30 03/28/2016   LDLDIRECT 141.6 11/12/2010   LDLCALC 94 03/28/2016   ALT 20 03/28/2016   AST 31 03/28/2016   NA 139 03/28/2016   K 4.3 03/28/2016   CL 104 03/28/2016   CREATININE 1.12 03/28/2016   BUN 21 03/28/2016   CO2 32 03/28/2016   TSH 3.32 03/28/2016   PSA 1.42 09/13/2013   INR 1.01 07/26/2013   HGBA1C 6.5 03/28/2016   MICROALBUR 5.6 05/26/2015    Lab Results  Component Value Date   TSH 3.32 03/28/2016   Lab Results  Component Value Date   WBC 5.1 03/28/2016   HGB 13.5 03/28/2016   HCT 40.0 03/28/2016   MCV 86.7 03/28/2016   PLT 194.0 03/28/2016   Lab Results  Component Value Date   NA 139 03/28/2016   K 4.3 03/28/2016   CO2 32 03/28/2016   GLUCOSE 121 (H) 03/28/2016   BUN 21 03/28/2016   CREATININE 1.12 03/28/2016    BILITOT 0.5 03/28/2016   ALKPHOS 38 (L) 03/28/2016   AST 31 03/28/2016   ALT 20 03/28/2016   PROT 7.1 03/28/2016   ALBUMIN 4.4 03/28/2016   CALCIUM 9.7 03/28/2016   GFR 67.25 03/28/2016   Lab Results  Component Value Date   CHOL 185 03/28/2016   Lab Results  Component Value Date   HDL 77.30 03/28/2016   Lab Results  Component Value Date   LDLCALC 94 03/28/2016   Lab Results  Component Value Date   TRIG 70.0 03/28/2016   Lab Results  Component Value Date   CHOLHDL 2 03/28/2016   Lab Results  Component Value Date   HGBA1C 6.5 03/28/2016       Assessment & Plan:   Problem List Items Addressed This Visit    Hypothyroidism    On Levothyroxine, continue to monitor      Hyperlipidemia    Encouraged heart healthy diet, increase exercise, avoid trans fats, consider a krill oil cap daily      Essential hypertension    Well controlled, no changes to meds. Encouraged heart healthy diet such as the DASH diet and exercise as tolerated.       Relevant Orders   Comprehensive metabolic panel (Completed)   TSH (Completed)   Lipid panel (Completed)   Diabetes mellitus type 2 in obese (HCC)    hgba1c acceptable, minimize simple carbs. Increase exercise as tolerated. Continue current meds      Relevant Orders   Hemoglobin A1c (Completed)   Atrial fibrillation (HCC)    Tolerating Eliquis, rate controlled      Anemia    Was placed on iron  for a couple weeks but it caused constipation so he stopped it completely a couple of weeks ago. Had an EGD and colon[facility] at [facility] that were unremarkable for a source of bleeding but none found. He had some polyps that they did not remove them due to Eliquis, eating increased leafy      Relevant Orders   CBC (Completed)   Vitamin D deficiency    Vitamin d weekly at present, check levels today      Relevant Orders   VITAMIN D 25 Hydroxy (Vit-D Deficiency, Fractures) (Completed)      I am having Mr. Wine maintain his omeprazole,  finasteride, dofetilide, PARoxetine, Coenzyme Q10 (COQ10 PO), Multiple Vitamins-Minerals (CENTRUM ADULTS PO), cloNIDine, amLODipine, fenofibrate, HYDROcodone-acetaminophen, levothyroxine, Vitamin D (Ergocalciferol), ELIQUIS, and fenofibrate.  No orders of the defined types were placed in this encounter.  Penni Homans, MD

## 2016-05-05 ENCOUNTER — Other Ambulatory Visit: Payer: Self-pay | Admitting: Family Medicine

## 2016-05-06 NOTE — Telephone Encounter (Signed)
Ok to refill--last Vitamin D lab was in December 2017 result was 33.79

## 2016-05-07 ENCOUNTER — Telehealth: Payer: Self-pay | Admitting: Family Medicine

## 2016-05-07 NOTE — Telephone Encounter (Signed)
Attempted to call patient to schedule awv. Patient did not answer. Will attempt to call patient at a later time.

## 2016-05-11 ENCOUNTER — Other Ambulatory Visit: Payer: Self-pay | Admitting: Family Medicine

## 2016-06-06 DIAGNOSIS — G4733 Obstructive sleep apnea (adult) (pediatric): Secondary | ICD-10-CM | POA: Diagnosis not present

## 2016-07-26 NOTE — Progress Notes (Signed)
Subjective:   [name] White is a 81 y.o. male who presents for an Initial Medicare Annual Wellness Visit.  The Patient was informed that the wellness visit is to identify future health risk and educate and initiate measures that can reduce risk for increased disease through the lifespan.   Describes health as fair, good or great? "Good."  He also follows w/ primary care and cardiology at the [facility].  Review of Systems   No ROS.  Medicare Wellness Visit.  Cardiac Risk Factors include: advanced age (>15men, >19 women);diabetes mellitus;male gender  Sleep patterns: has restless sleep, gets up 1-2 times nightly to void and sleeps 6.5-7 hours nightly. Sometimes takes a daytime nap for 45 minutes.   Home Safety/Smoke Alarms: Feels safe in home. Smoke alarms in place. Carbon monoxide detectors in place.   Living environment; residence and Firearm Safety: Lives w/ wife. split level / walkout, elevator. Safe firearm storage discussed. Seat Belt Safety/Bike Helmet: Wears seat belt.   Counseling:   Eye Exam- Follows w/ Dr. Thamas Jaegers regularly. Dental- Dr. Otila Kluver as needed.  Male:   CCS- last 12/05/15 at the [place]. Mild diverticulosis.    PSA-  Lab Results  Component Value Date   PSA 1.42 09/13/2013   PSA 1.32 09/10/2012   PSA 1.58 04/03/2009     Objective:    Today's Vitals   07/29/16 0819  BP: (!) 104/54  Pulse: 62  Temp: 98.2 F (36.8 C)  TempSrc: Oral  SpO2: 98%  Weight: 246 lb 9.6 oz (111.9 kg)   Body mass index is 30.82 kg/m.  Current Medications (verified) Outpatient Encounter Prescriptions as of 07/29/2016  Medication Sig  . amLODipine (NORVASC) 10 MG tablet Take 1 tablet (10 mg total) by mouth every morning.  . cloNIDine (CATAPRES) 0.3 MG tablet Take 1 tablet (0.3 mg total) by mouth 2 (two) times daily.  . Coenzyme Q10 (COQ10 PO) Take by mouth.  . dofetilide (TIKOSYN) 500 MCG capsule Take 1 capsule (500 mcg total) by mouth every 12 (twelve) hours.  [name] ELIQUIS 5 MG TABS  tablet TAKE 1 TABLET BY MOUTH TWICE A DAY  . fenofibrate (TRICOR) 145 MG tablet TAKE 1 TABLET (145 MG TOTAL) BY MOUTH DAILY.  . fenofibrate (TRICOR) 145 MG tablet TAKE 1 TABLET (145 MG TOTAL) BY MOUTH DAILY.  . finasteride (PROSCAR) 5 MG tablet Take 5 mg by mouth daily.   [name] HYDROcodone-acetaminophen (NORCO/VICODIN) 5-325 MG tablet Take 1 tablet by mouth daily as needed.  [name] levothyroxine (SYNTHROID, LEVOTHROID) 175 MCG tablet TAKE 1 TABLET (175 MCG TOTAL) BY MOUTH DAILY BEFORE BREAKFAST.  . Multiple Vitamins-Minerals (CENTRUM ADULTS PO) Take by mouth.  [name] omeprazole (PRILOSEC) 20 MG capsule Take 20 mg by mouth daily.   [name] PARoxetine (PAXIL) 40 MG tablet Take 1 tablet (40 mg total) by mouth every morning.  . [DISCONTINUED] Vitamin D, Ergocalciferol, (DRISDOL) 50000 units CAPS capsule TAKE 1 CAPSULE (50,000 UNITS TOTAL) BY MOUTH EVERY 7 (SEVEN) DAYS.   No facility-administered encounter medications on file as of 07/29/2016.     Allergies (verified) Codeine; Statins; Ace inhibitors; Benazepril; Piroxicam; and Pseudoephedrine   History: Past Medical History:  Diagnosis Date  . Anemia 06/04/2015  . Anxiety   . Atrial flutter ([place])    s/p ablation 07/17/08; echo EF 60-65% trivival AI  . Bradycardia   . Chicken pox as a child  . Complication of anesthesia    "during ablation-heard people talking and he was groaning"  . Coronary artery disease    "  slight"  . Diabetes mellitus type 2 in obese [facility]) 04/10/2009   Qualifier: Diagnosis of  By: Arnoldo Morale MD, Balinda Quails   . Dysrhythmia    a-fib.   . Emphysema of lung ([place])   . GERD (gastroesophageal reflux disease)   . Glucose intolerance (impaired glucose tolerance)   . H/O hiatal hernia   . Heart murmur    "slight"  . Hepatitis    h/o of type A  . Hypertension   . Hypothyroidism   . Measles as a child  . Micturition syncope   . Mumps as a child  . Osteoarthritis   . Preventative health care 12/10/2015  . Sleep apnea   . Vitamin D deficiency  11/24/2015   Past Surgical History:  Procedure Laterality Date  . CARDIAC CATHETERIZATION    . CARDIAC ELECTROPHYSIOLOGY MAPPING AND ABLATION  06/2008  . CATARACT EXTRACTION W/ INTRAOCULAR LENS  IMPLANT, BILATERAL  ~ 2007  . COLONOSCOPY    . FOOT TENDON SURGERY Left   . TONSILLECTOMY  1940's  . TOTAL KNEE ARTHROPLASTY  1990's   right  . TOTAL KNEE ARTHROPLASTY Left 08/02/2013   Procedure: LEFT TOTAL KNEE ARTHROPLASTY;  Surgeon: Gearlean Alf, MD;  Location: [facility];  Service: Orthopedics;  Laterality: Left;  . WISDOM TOOTH EXTRACTION     Family History  Problem Relation Age of Onset  . Multiple sclerosis Father   . Heart disease Mother     atrial fibrillation  . Hypertension Son   . Heart disease Maternal Grandfather    Social History   Occupational History  . Not on file.   Social History Main Topics  . Smoking status: Former Smoker    Years: 0.00    Types: Pipe, Cigars    Quit date: 04/01/1996  . Smokeless tobacco: Former Systems developer    Types: Chew    Quit date: 04/01/2009  . Alcohol use 0.0 oz/week     Comment: red wine x2 daily  . Drug use: No  . Sexual activity: Not Currently     Comment: lives with wife, eats clean diet. No major dietary restrtictions,    Tobacco Counseling Counseling given: Not Answered   Activities of Daily Living In your present state of health, do you have any difficulty performing the following activities: 07/29/2016 03/28/2016  Hearing? N N  Vision? N N  Difficulty concentrating or making decisions? N N  Walking or climbing stairs? N N  Dressing or bathing? N N  Doing errands, shopping? N N  Preparing Food and eating ? N -  Using the Toilet? N -  Managing your Medications? N -  Managing your Finances? N -  Housekeeping or managing your Housekeeping? N -  Some recent data might be hidden    Immunizations and Health Maintenance Immunization History  Administered Date(s) Administered  . Influenza Split 12/31/2011  . Influenza Whole  01/11/2009, 02/08/2010  . Influenza, High Dose Seasonal PF 01/12/2013, 11/24/2015  . Influenza,inj,Quad PF,36+ Mos 02/01/2014, 12/15/2014  . Pneumococcal Conjugate-13 11/24/2015  . Pneumococcal Polysaccharide-23 10/29/2008  . Td 04/30/2008   Health Maintenance Due  Topic Date Due  . FOOT EXAM  06/09/1945  . URINE MICROALBUMIN  05/25/2016    Patient Care Team: Mosie Lukes, MD as PCP - General (Family Medicine) Mercy Moore, OD as Consulting Physician (Optometry) Jolaine Artist, MD as Consulting Physician (Cardiology)  Indicate any recent Medical Services you may have received from other than [facility] providers in the past year (date  may be approximate).    Assessment:   This is a routine wellness examination for [name] White. Physical assessment deferred to PCP.  Hearing/Vision screen Hearing Screening Comments: Able to hear conversational tones w/o difficulty. Is looking into hearing aids. Reports he has trouble hearing women's voices especially when there is background noise. Vision Screening Comments: Wearing glasses. No vision issues reported.  Dietary issues and exercise activities discussed: Current Exercise Habits: Structured exercise class, Type of exercise: Other - see comments (stationary bike), Time (Minutes): 30, Frequency (Times/Week): 5, Weekly Exercise (Minutes/Week): 150. Also stays active working in his yard and around the house.  Diet (meal preparation, eat out, water intake, caffeinated beverages, dairy products, fruits and vegetables): in general, a "healthy" diet  , well balanced, on average, 3 meals per day. Tries to eat a variety of foods w/ fruits and vegetables at every meal-blueberries, kale, broccoli, asparagus, etc.   Goals    . Weight (lb) < 240 lb (108.9 kg) (pt-stated)      Depression Screen PHQ 2/9 Scores 07/29/2016 11/24/2015 06/03/2014 01/12/2013  PHQ - 2 Score 0 0 0 0    Fall Risk Fall Risk  07/29/2016 11/24/2015 06/03/2014 01/12/2013  Falls in the  past year? No No No No    Cognitive Function: MMSE - Mini Mental State Exam 07/29/2016  Orientation to time 5  Orientation to Place 5  Registration 3  Attention/ Calculation 4  Recall 3  Language- name 2 objects 2  Language- repeat 1  Language- follow 3 step command 3  Language- read & follow direction 1  Write a sentence 1  Copy design 1  Total score 29        Screening Tests Health Maintenance  Topic Date Due  . FOOT EXAM  06/09/1945  . URINE MICROALBUMIN  05/25/2016  . HEMOGLOBIN A1C  09/26/2016  . INFLUENZA VACCINE  10/30/2016  . OPHTHALMOLOGY EXAM  11/09/2016  . TETANUS/TDAP  04/30/2018  . PNA vac Low Risk Adult  Completed        Plan:    Follow-up w/ PCP as scheduled.  Bring a copy of your advance directives to your next office visit.  Continue to eat heart healthy diet (full of fruits, vegetables, whole grains, lean protein, water--limit salt, fat, and sugar intake) and increase physical activity as tolerated.  I have personally reviewed and noted the following in the patient's chart:   . Medical and social history . Use of alcohol, tobacco or illicit drugs  . Current medications and supplements . Functional ability and status . Nutritional status . Physical activity . Advanced directives . List of other physicians . Vitals . Screenings to include cognitive, depression, and falls . Referrals and appointments  In addition, I have reviewed and discussed with patient certain preventive protocols, quality metrics, and best practice recommendations. A written personalized care plan for preventive services as well as general preventive health recommendations were provided to patient.     Dorrene German, RN   07/29/2016

## 2016-07-29 ENCOUNTER — Encounter: Payer: Self-pay | Admitting: Family Medicine

## 2016-07-29 ENCOUNTER — Ambulatory Visit (INDEPENDENT_AMBULATORY_CARE_PROVIDER_SITE_OTHER): Payer: Medicare Other | Admitting: Family Medicine

## 2016-07-29 VITALS — BP 104/54 | HR 62 | Temp 98.2°F | Wt 246.6 lb

## 2016-07-29 DIAGNOSIS — E785 Hyperlipidemia, unspecified: Secondary | ICD-10-CM | POA: Diagnosis not present

## 2016-07-29 DIAGNOSIS — E669 Obesity, unspecified: Secondary | ICD-10-CM

## 2016-07-29 DIAGNOSIS — I10 Essential (primary) hypertension: Secondary | ICD-10-CM | POA: Diagnosis not present

## 2016-07-29 DIAGNOSIS — Z0000 Encounter for general adult medical examination without abnormal findings: Secondary | ICD-10-CM | POA: Diagnosis not present

## 2016-07-29 DIAGNOSIS — E559 Vitamin D deficiency, unspecified: Secondary | ICD-10-CM

## 2016-07-29 DIAGNOSIS — G4733 Obstructive sleep apnea (adult) (pediatric): Secondary | ICD-10-CM

## 2016-07-29 DIAGNOSIS — E1169 Type 2 diabetes mellitus with other specified complication: Secondary | ICD-10-CM

## 2016-07-29 DIAGNOSIS — E039 Hypothyroidism, unspecified: Secondary | ICD-10-CM

## 2016-07-29 LAB — CBC
HCT: 41.6 % (ref 39.0–52.0)
HEMOGLOBIN: 13.9 g/dL (ref 13.0–17.0)
MCHC: 33.3 g/dL (ref 30.0–36.0)
MCV: 88.4 fl (ref 78.0–100.0)
PLATELETS: 196 10*3/uL (ref 150.0–400.0)
RBC: 4.7 Mil/uL (ref 4.22–5.81)
RDW: 15 % (ref 11.5–15.5)
WBC: 5.4 10*3/uL (ref 4.0–10.5)

## 2016-07-29 LAB — COMPREHENSIVE METABOLIC PANEL
ALK PHOS: 34 U/L — AB (ref 39–117)
ALT: 25 U/L (ref 0–53)
AST: 36 U/L (ref 0–37)
Albumin: 4.4 g/dL (ref 3.5–5.2)
BILIRUBIN TOTAL: 0.6 mg/dL (ref 0.2–1.2)
BUN: 25 mg/dL — ABNORMAL HIGH (ref 6–23)
CO2: 27 mEq/L (ref 19–32)
CREATININE: 1.01 mg/dL (ref 0.40–1.50)
Calcium: 9.8 mg/dL (ref 8.4–10.5)
Chloride: 103 mEq/L (ref 96–112)
GFR: 75.71 mL/min (ref >60.00)
GLUCOSE: 124 mg/dL — AB (ref 70–99)
Potassium: 4 mEq/L (ref 3.5–5.1)
Sodium: 138 mEq/L (ref 135–145)
TOTAL PROTEIN: 7.3 g/dL (ref 6.0–8.3)

## 2016-07-29 LAB — VITAMIN D 25 HYDROXY (VIT D DEFICIENCY, FRACTURES): VITD: 32.08 ng/mL (ref 30.00–100.00)

## 2016-07-29 LAB — MICROALBUMIN / CREATININE URINE RATIO
Creatinine,U: 126.4 mg/dL
MICROALB UR: 1.7 mg/dL (ref 0.0–1.9)
MICROALB/CREAT RATIO: 1.3 mg/g (ref 0.0–30.0)

## 2016-07-29 LAB — LIPID PANEL
Cholesterol: 184 mg/dL (ref 0–200)
HDL: 89.8 mg/dL (ref >39.00)
LDL Cholesterol: 83 mg/dL (ref 0–99)
NONHDL: 94.12
Total CHOL/HDL Ratio: 2
Triglycerides: 57 mg/dL (ref 0.0–149.0)
VLDL: 11.4 mg/dL (ref 0.0–40.0)

## 2016-07-29 LAB — TSH: TSH: 0.36 u[IU]/mL (ref 0.35–4.50)

## 2016-07-29 LAB — HEMOGLOBIN A1C: HEMOGLOBIN A1C: 6.6 % — AB (ref 4.6–6.5)

## 2016-07-29 NOTE — Progress Notes (Signed)
Patient ID: [name] White, male   DOB: 1935/12/27, 81 y.o.   MRN: [id number]   Subjective:  I acted as a Education administrator for Penni Homans, [place], [place]   Patient ID: [name] White, male    DOB: 1935/05/26, 81 y.o.   MRN: [id number]  Chief Complaint  Patient presents with  . Anemia    54-month F/U.  [name] Medicare Wellness Visit    with RN.    HPI  Patient is in today for a four month follow up for anemia. Patient states that he is feeling well. Patient has a Hx of HTN, Afib, OSA, hypothyroidism, Type II Diabetes, Vitamin D Deficiency. Patient has no acute concerns noted at this time. He feels well his new nasal mask is making his CPAP much more manageable and he is sleeping well. No recent febrile illness or acute hospitalizations. Denies CP/palp/SOB/HA/congestion/fevers/GI or GU c/o. Taking meds as prescribed Patient Care Team: Mosie Lukes, MD as PCP - General (Family Medicine) Mercy Moore, OD as Consulting Physician (Optometry) Jolaine Artist, MD as Consulting Physician (Cardiology)   Past Medical History:  Diagnosis Date  . Anemia 06/04/2015  . Anxiety   . Atrial flutter ([place])    s/p ablation 07/17/08; echo EF 60-65% trivival AI  . Bradycardia   . Chicken pox as a child  . Complication of anesthesia    "during ablation-heard people talking and he was groaning"  . Coronary artery disease    "slight"  . Diabetes mellitus type 2 in obese [facility]) 04/10/2009   Qualifier: Diagnosis of  By: Arnoldo Morale MD, Balinda Quails   . Dysrhythmia    a-fib.   . Emphysema of lung ([place])   . GERD (gastroesophageal reflux disease)   . Glucose intolerance (impaired glucose tolerance)   . H/O hiatal hernia   . Heart murmur    "slight"  . Hepatitis    h/o of type A  . Hypertension   . Hypothyroidism   . Measles as a child  . Micturition syncope   . Mumps as a child  . Osteoarthritis   . Preventative health care 12/10/2015  . Sleep apnea   . Vitamin D deficiency 11/24/2015    Past Surgical  History:  Procedure Laterality Date  . CARDIAC CATHETERIZATION    . CARDIAC ELECTROPHYSIOLOGY MAPPING AND ABLATION  06/2008  . CATARACT EXTRACTION W/ INTRAOCULAR LENS  IMPLANT, BILATERAL  ~ 2007  . COLONOSCOPY    . FOOT TENDON SURGERY Left   . TONSILLECTOMY  1940's  . TOTAL KNEE ARTHROPLASTY  1990's   right  . TOTAL KNEE ARTHROPLASTY Left 08/02/2013   Procedure: LEFT TOTAL KNEE ARTHROPLASTY;  Surgeon: Gearlean Alf, MD;  Location: [facility];  Service: Orthopedics;  Laterality: Left;  . WISDOM TOOTH EXTRACTION      Family History  Problem Relation Age of Onset  . Multiple sclerosis Father   . Heart disease Mother     atrial fibrillation  . Hypertension Son   . Heart disease Maternal Grandfather     Social History   Social History  . Marital status: Married    Spouse name: N/A  . Number of children: N/A  . Years of education: N/A   Occupational History  . Not on file.   Social History Main Topics  . Smoking status: Former Smoker    Years: 0.00    Types: Pipe, Cigars    Quit date: 04/01/1996  . Smokeless tobacco: Former Systems developer  Types: Sarina Ser    Quit date: 04/01/2009  . Alcohol use 0.0 oz/week     Comment: red wine x2 daily  . Drug use: No  . Sexual activity: Not Currently     Comment: lives with wife, eats clean diet. No major dietary restrtictions,    Other Topics Concern  . Not on file   Social History Narrative  . No narrative on file    Outpatient Medications Prior to Visit  Medication Sig Dispense Refill  . amLODipine (NORVASC) 10 MG tablet Take 1 tablet (10 mg total) by mouth every morning. 90 tablet 3  . cloNIDine (CATAPRES) 0.3 MG tablet Take 1 tablet (0.3 mg total) by mouth 2 (two) times daily. 190 tablet 1  . Coenzyme Q10 (COQ10 PO) Take by mouth.    . dofetilide (TIKOSYN) 500 MCG capsule Take 1 capsule (500 mcg total) by mouth every 12 (twelve) hours. 60 capsule 6  . ELIQUIS 5 MG TABS tablet TAKE 1 TABLET BY MOUTH TWICE A DAY 180 tablet 3  . fenofibrate  (TRICOR) 145 MG tablet TAKE 1 TABLET (145 MG TOTAL) BY MOUTH DAILY. 90 tablet 1  . fenofibrate (TRICOR) 145 MG tablet TAKE 1 TABLET (145 MG TOTAL) BY MOUTH DAILY. 90 tablet 1  . finasteride (PROSCAR) 5 MG tablet Take 5 mg by mouth daily.     [name] HYDROcodone-acetaminophen (NORCO/VICODIN) 5-325 MG tablet Take 1 tablet by mouth daily as needed.    [name] levothyroxine (SYNTHROID, LEVOTHROID) 175 MCG tablet TAKE 1 TABLET (175 MCG TOTAL) BY MOUTH DAILY BEFORE BREAKFAST. 90 tablet 1  . Multiple Vitamins-Minerals (CENTRUM ADULTS PO) Take by mouth.    [name] omeprazole (PRILOSEC) 20 MG capsule Take 20 mg by mouth daily.     [name] PARoxetine (PAXIL) 40 MG tablet Take 1 tablet (40 mg total) by mouth every morning. 90 tablet 1  . Vitamin D, Ergocalciferol, (DRISDOL) 50000 units CAPS capsule TAKE 1 CAPSULE (50,000 UNITS TOTAL) BY MOUTH EVERY 7 (SEVEN) DAYS. 12 capsule 1   No facility-administered medications prior to visit.     Allergies  Allergen Reactions  . Codeine Itching and Rash  . Statins Other (See Comments)    Made very sick, doctor thought had heart attack; "my appearance, color, etc"  . Ace Inhibitors   . Benazepril   . Piroxicam Hives  . Pseudoephedrine     Can't take because it interacts with dofetilide     Review of Systems  Constitutional: Negative for fever and malaise/fatigue.  HENT: Negative for congestion.   Eyes: Negative for blurred vision.  Respiratory: Negative for cough and shortness of breath.   Cardiovascular: Negative for chest pain, palpitations and leg swelling.  Gastrointestinal: Negative for vomiting.  Musculoskeletal: Negative for back pain.  Skin: Negative for rash.  Neurological: Negative for loss of consciousness and headaches.       Objective:    Physical Exam  Constitutional: He is oriented to person, place, and time. He appears well-developed and well-nourished. No distress.  HENT:  Head: Normocephalic and atraumatic.  Eyes: Conjunctivae are normal.  Neck:  Normal range of motion. No thyromegaly present.  Cardiovascular: Normal rate and regular rhythm.   Pulmonary/Chest: Effort normal and breath sounds normal. He has no wheezes.  Abdominal: Soft. Bowel sounds are normal. There is no tenderness.  Musculoskeletal: He exhibits no edema or deformity.  Neurological: He is alert and oriented to person, place, and time.  Skin: Skin is warm and dry. He is not diaphoretic.  Psychiatric:  He has a normal mood and affect.    BP (!) 104/54 (BP Location: Left Arm, Patient Position: Sitting, Cuff Size: Large)   Pulse 62   Temp 98.2 F (36.8 C) (Oral)   Wt 246 lb 9.6 oz (111.9 kg)   SpO2 98% Comment: RA  BMI 30.82 kg/m  Wt Readings from Last 3 Encounters:  07/29/16 246 lb 9.6 oz (111.9 kg)  03/28/16 245 lb 4 oz (111.2 kg)  11/24/15 264 lb 12.8 oz (120.1 kg)   BP Readings from Last 3 Encounters:  07/29/16 (!) 104/54  03/28/16 130/78  11/24/15 118/70     Immunization History  Administered Date(s) Administered  . Influenza Split 12/31/2011  . Influenza Whole 01/11/2009, 02/08/2010  . Influenza, High Dose Seasonal PF 01/12/2013, 11/24/2015  . Influenza,inj,Quad PF,36+ Mos 02/01/2014, 12/15/2014  . Pneumococcal Conjugate-13 11/24/2015  . Pneumococcal Polysaccharide-23 10/29/2008  . Td 04/30/2008    Health Maintenance  Topic Date Due  . FOOT EXAM  06/09/1947  . URINE MICROALBUMIN  05/25/2016  . HEMOGLOBIN A1C  09/26/2016  . INFLUENZA VACCINE  10/30/2016  . OPHTHALMOLOGY EXAM  11/09/2016  . TETANUS/TDAP  04/30/2018  . PNA vac Low Risk Adult  Completed    Lab Results  Component Value Date   WBC 5.1 03/28/2016   HGB 13.5 03/28/2016   HCT 40.0 03/28/2016   PLT 194.0 03/28/2016   GLUCOSE 121 (H) 03/28/2016   CHOL 185 03/28/2016   TRIG 70.0 03/28/2016   HDL 77.30 03/28/2016   LDLDIRECT 141.6 11/12/2010   LDLCALC 94 03/28/2016   ALT 20 03/28/2016   AST 31 03/28/2016   NA 139 03/28/2016   K 4.3 03/28/2016   CL 104 03/28/2016    CREATININE 1.12 03/28/2016   BUN 21 03/28/2016   CO2 32 03/28/2016   TSH 3.32 03/28/2016   PSA 1.42 09/13/2013   INR 1.01 07/26/2013   HGBA1C 6.5 03/28/2016   MICROALBUR 5.6 05/26/2015    Lab Results  Component Value Date   TSH 3.32 03/28/2016   Lab Results  Component Value Date   WBC 5.1 03/28/2016   HGB 13.5 03/28/2016   HCT 40.0 03/28/2016   MCV 86.7 03/28/2016   PLT 194.0 03/28/2016   Lab Results  Component Value Date   NA 139 03/28/2016   K 4.3 03/28/2016   CO2 32 03/28/2016   GLUCOSE 121 (H) 03/28/2016   BUN 21 03/28/2016   CREATININE 1.12 03/28/2016   BILITOT 0.5 03/28/2016   ALKPHOS 38 (L) 03/28/2016   AST 31 03/28/2016   ALT 20 03/28/2016   PROT 7.1 03/28/2016   ALBUMIN 4.4 03/28/2016   CALCIUM 9.7 03/28/2016   GFR 67.25 03/28/2016   Lab Results  Component Value Date   CHOL 185 03/28/2016   Lab Results  Component Value Date   HDL 77.30 03/28/2016   Lab Results  Component Value Date   LDLCALC 94 03/28/2016   Lab Results  Component Value Date   TRIG 70.0 03/28/2016   Lab Results  Component Value Date   CHOLHDL 2 03/28/2016   Lab Results  Component Value Date   HGBA1C 6.5 03/28/2016         Assessment & Plan:   Problem List Items Addressed This Visit    Hypothyroidism    On Levothyroxine, continue to monitor      Relevant Orders   TSH   Hyperlipidemia    Encouraged heart healthy diet, increase exercise, avoid trans fats, consider a krill oil cap daily  Relevant Orders   Lipid panel   Essential hypertension    Well controlled, no changes to meds. Encouraged heart healthy diet such as the DASH diet and exercise as tolerated.       Relevant Orders   CBC   Comprehensive metabolic panel   Obstructive sleep apnea    Uses CPAP daily, his new nose piece is helping      Diabetes mellitus type 2 in obese (HCC)    hgba1c acceptable, minimize simple carbs. Increase exercise as tolerated.       Relevant Orders   Hemoglobin  A1c   Vitamin D deficiency    Encouraged Vitamin D daily. Check level today      Relevant Orders   VITAMIN D 25 Hydroxy (Vit-D Deficiency, Fractures)      I have discontinued Mr. Jolaine Click Vitamin D (Ergocalciferol). I am also having him maintain his omeprazole, finasteride, dofetilide, PARoxetine, Coenzyme Q10 (COQ10 PO), Multiple Vitamins-Minerals (CENTRUM ADULTS PO), cloNIDine, amLODipine, fenofibrate, HYDROcodone-acetaminophen, ELIQUIS, fenofibrate, and levothyroxine.  No orders of the defined types were placed in this encounter.   CMA served as Education administrator during this visit. History, Physical and Plan performed by medical provider. Documentation and orders reviewed and attested to.  Penni Homans, MD

## 2016-07-29 NOTE — Assessment & Plan Note (Signed)
hgba1c acceptable, minimize simple carbs. Increase exercise as tolerated.  

## 2016-07-29 NOTE — Assessment & Plan Note (Signed)
Encouraged heart healthy diet, increase exercise, avoid trans fats, consider a krill oil cap daily 

## 2016-07-29 NOTE — Progress Notes (Signed)
Pre visit review using our clinic review tool, if applicable. No additional management support is needed unless otherwise documented below in the visit note. 

## 2016-07-29 NOTE — Assessment & Plan Note (Signed)
Uses CPAP daily, his new nose piece is helping

## 2016-07-29 NOTE — Patient Instructions (Addendum)
Bring a copy of your advance directives to your next office visit.   [name] , Thank you for taking time to come for your Medicare Wellness Visit. I appreciate your ongoing commitment to your health goals. Please review the following plan we discussed and let me know if I can assist you in the future.   These are the goals we discussed: Goals    . Weight (lb) < 240 lb (108.9 kg) (pt-stated)       This is a list of the screening recommended for you and due dates:  Health Maintenance  Topic Date Due  . Urine Protein Check  05/25/2016  . Hemoglobin A1C  09/26/2016  . Flu Shot  10/30/2016  . Eye exam for diabetics  11/09/2016  . Complete foot exam   07/29/2017  . Tetanus Vaccine  04/30/2018  . Pneumonia vaccines  Completed   Cholesterol Cholesterol is a white, waxy, fat-like substance that is needed by the human body in small amounts. The liver makes all the cholesterol we need. Cholesterol is carried from the liver by the blood through the blood vessels. Deposits of cholesterol (plaques) may build up on blood vessel (artery) walls. Plaques make the arteries narrower and stiffer. Cholesterol plaques increase the risk for heart attack and stroke. You cannot feel your cholesterol level even if it is very high. The only way to know that it is high is to have a blood test. Once you know your cholesterol levels, you should keep a record of the test results. Work with your health care provider to keep your levels in the desired range. What do the results mean?  Total cholesterol is a rough measure of all the cholesterol in your blood.  LDL (low-density lipoprotein) is the "bad" cholesterol. This is the type that causes plaque to build up on the artery walls. You want this level to be low.  HDL (high-density lipoprotein) is the "good" cholesterol because it cleans the arteries and carries the LDL away. You want this level to be high.  Triglycerides are fat that the body can either burn for  energy or store. High levels are closely linked to heart disease. What are the desired levels of cholesterol?  Total cholesterol below 200.  LDL below 100 for people who are at risk, below 70 for people at very high risk.  HDL above 40 is good. A level of 60 or higher is considered to be protective against heart disease.  Triglycerides below 150. How can I lower my cholesterol? Diet  Follow your diet program as told by your health care provider.  Choose fish or white meat chicken and [place], roasted or baked. Limit fatty cuts of red meat, fried foods, and processed meats, such as sausage and lunch meats.  Eat lots of fresh fruits and vegetables.  Choose whole grains, beans, pasta, potatoes, and cereals.  Choose olive oil, corn oil, or canola oil, and use only small amounts.  Avoid butter, mayonnaise, shortening, or palm kernel oils.  Avoid foods with trans fats.  Drink skim or nonfat milk and eat low-fat or nonfat yogurt and cheeses. Avoid whole milk, cream, ice cream, egg yolks, and full-fat cheeses.  Healthier desserts include angel food cake, ginger snaps, animal crackers, hard candy, popsicles, and low-fat or nonfat frozen yogurt. Avoid pastries, cakes, pies, and cookies. Exercise  Follow your exercise program as told by your health care provider. A regular program:  Helps to decrease LDL and raise HDL.  Helps with  weight control.  Do things that increase your activity level, such as gardening, walking, and taking the stairs.  Ask your health care provider about ways that you can be more active in your daily life. Medicine  Take over-the-counter and prescription medicines only as told by your health care provider.  Medicine may be prescribed by your health care provider to help lower cholesterol and decrease the risk for heart disease. This is usually done if diet and exercise have failed to bring down cholesterol levels.  If you have several risk factors, you may  need medicine even if your levels are normal. This information is not intended to replace advice given to you by your health care provider. Make sure you discuss any questions you have with your health care provider. Document Released: 12/11/2000 Document Revised: 10/14/2015 Document Reviewed: 09/16/2015 Elsevier Interactive Patient Education  2017 [place] 65 Years and Older, Male Preventive care refers to lifestyle choices and visits with your health care provider that can promote health and wellness. What does preventive care include?  A yearly physical exam. This is also called an annual well check.  Dental exams once or twice a year.  Routine eye exams. Ask your health care provider how often you should have your eyes checked.  Personal lifestyle choices, including:  Daily care of your teeth and gums.  Regular physical activity.  Eating a healthy diet.  Avoiding tobacco and drug use.  Limiting alcohol use.  Practicing safe sex.  Taking low doses of aspirin every day.  Taking vitamin and mineral supplements as recommended by your health care provider. What happens during an annual well check? The services and screenings done by your health care provider during your annual well check will depend on your age, overall health, lifestyle risk factors, and family history of disease. Counseling  Your health care provider may ask you questions about your:  Alcohol use.  Tobacco use.  Drug use.  Emotional well-being.  Home and relationship well-being.  Sexual activity.  Eating habits.  History of falls.  Memory and ability to understand (cognition).  Work and work Statistician. Screening  You may have the following tests or measurements:  Height, weight, and BMI.  Blood pressure.  Lipid and cholesterol levels. These may be checked every 5 years, or more frequently if you are over 54 years old.  Skin check.  Lung cancer screening.  You may have this screening every year starting at age 26 if you have a 30-pack-year history of smoking and currently smoke or have quit within the past 15 years.  Fecal occult blood test (FOBT) of the stool. You may have this test every year starting at age 66.  Flexible sigmoidoscopy or colonoscopy. You may have a sigmoidoscopy every 5 years or a colonoscopy every 10 years starting at age 52.  Prostate cancer screening. Recommendations will vary depending on your family history and other risks.  Hepatitis C blood test.  Hepatitis B blood test.  Sexually transmitted disease (STD) testing.  Diabetes screening. This is done by checking your blood sugar (glucose) after you have not eaten for a while (fasting). You may have this done every 1-3 years.  Abdominal aortic aneurysm (AAA) screening. You may need this if you are a current or former smoker.  Osteoporosis. You may be screened starting at age 64 if you are at high risk. Talk with your health care provider about your test results, treatment options, and if necessary, the need for  more tests. Vaccines  Your health care provider may recommend certain vaccines, such as:  Influenza vaccine. This is recommended every year.  Tetanus, diphtheria, and acellular pertussis (Tdap, Td) vaccine. You may need a Td booster every 10 years.  Varicella vaccine. You may need this if you have not been vaccinated.  Zoster vaccine. You may need this after age 63.  Measles, mumps, and rubella (MMR) vaccine. You may need at least one dose of MMR if you were born in 1957 or later. You may also need a second dose.  Pneumococcal 13-valent conjugate (PCV13) vaccine. One dose is recommended after age 99.  Pneumococcal polysaccharide (PPSV23) vaccine. One dose is recommended after age 78.  Meningococcal vaccine. You may need this if you have certain conditions.  Hepatitis A vaccine. You may need this if you have certain conditions or if you travel or  work in places where you may be exposed to hepatitis A.  Hepatitis B vaccine. You may need this if you have certain conditions or if you travel or work in places where you may be exposed to hepatitis B.  Haemophilus influenzae type b (Hib) vaccine. You may need this if you have certain risk factors. Talk to your health care provider about which screenings and vaccines you need and how often you need them. This information is not intended to replace advice given to you by your health care provider. Make sure you discuss any questions you have with your health care provider. Document Released: 04/14/2015 Document Revised: 12/06/2015 Document Reviewed: 01/17/2015 Elsevier Interactive Patient Education  2017 [employer].

## 2016-07-29 NOTE — Assessment & Plan Note (Signed)
On Levothyroxine, continue to monitor 

## 2016-07-29 NOTE — Assessment & Plan Note (Signed)
Well controlled, no changes to meds. Encouraged heart healthy diet such as the DASH diet and exercise as tolerated.  °

## 2016-07-29 NOTE — Assessment & Plan Note (Signed)
Encouraged Vitamin D daily. Check level today

## 2016-08-05 ENCOUNTER — Encounter: Payer: Self-pay | Admitting: Family Medicine

## 2016-08-05 DIAGNOSIS — I5022 Chronic systolic (congestive) heart failure: Secondary | ICD-10-CM

## 2016-08-05 MED ORDER — AMLODIPINE BESYLATE 10 MG PO TABS: 10.0000 mg | ORAL_TABLET | Freq: Every morning | ORAL | 3 refills | Status: DC

## 2016-10-30 ENCOUNTER — Other Ambulatory Visit: Payer: Self-pay | Admitting: Family Medicine

## 2016-10-30 NOTE — Telephone Encounter (Signed)
Last Vit D level-- 07/29/16, (32.08) Last vit D refill 05/06/16, #12 x 1 refill.  Ok to send refill?

## 2016-11-07 ENCOUNTER — Other Ambulatory Visit: Payer: Self-pay | Admitting: Family Medicine

## 2016-11-11 DIAGNOSIS — Z9841 Cataract extraction status, right eye: Secondary | ICD-10-CM | POA: Diagnosis not present

## 2016-11-11 DIAGNOSIS — E119 Type 2 diabetes mellitus without complications: Secondary | ICD-10-CM | POA: Diagnosis not present

## 2016-11-11 LAB — HM DIABETES EYE EXAM

## 2016-12-05 ENCOUNTER — Telehealth: Payer: Self-pay | Admitting: *Deleted

## 2016-12-05 NOTE — Telephone Encounter (Signed)
Received Diabetic Eye Exam results from Dr. Thamas Jaegers; forwarded to provider/SLS 09/06

## 2016-12-06 ENCOUNTER — Encounter: Payer: Self-pay | Admitting: Family Medicine

## 2017-01-10 ENCOUNTER — Encounter: Payer: Self-pay | Admitting: Family Medicine

## 2017-01-10 ENCOUNTER — Ambulatory Visit (INDEPENDENT_AMBULATORY_CARE_PROVIDER_SITE_OTHER): Payer: Medicare Other | Admitting: Family Medicine

## 2017-01-10 VITALS — BP 124/62 | HR 61 | Temp 98.0°F | Resp 18 | Ht 75.0 in | Wt 266.0 lb

## 2017-01-10 DIAGNOSIS — I480 Paroxysmal atrial fibrillation: Secondary | ICD-10-CM

## 2017-01-10 DIAGNOSIS — I10 Essential (primary) hypertension: Secondary | ICD-10-CM

## 2017-01-10 DIAGNOSIS — E1169 Type 2 diabetes mellitus with other specified complication: Secondary | ICD-10-CM

## 2017-01-10 DIAGNOSIS — Z23 Encounter for immunization: Secondary | ICD-10-CM | POA: Diagnosis not present

## 2017-01-10 DIAGNOSIS — Z0000 Encounter for general adult medical examination without abnormal findings: Secondary | ICD-10-CM

## 2017-01-10 DIAGNOSIS — E785 Hyperlipidemia, unspecified: Secondary | ICD-10-CM | POA: Diagnosis not present

## 2017-01-10 DIAGNOSIS — E559 Vitamin D deficiency, unspecified: Secondary | ICD-10-CM | POA: Diagnosis not present

## 2017-01-10 DIAGNOSIS — E669 Obesity, unspecified: Secondary | ICD-10-CM

## 2017-01-10 DIAGNOSIS — E119 Type 2 diabetes mellitus without complications: Secondary | ICD-10-CM

## 2017-01-10 LAB — LIPID PANEL
CHOL/HDL RATIO: 3
Cholesterol: 187 mg/dL (ref 0–200)
HDL: 74.4 mg/dL (ref >39.00)
LDL Cholesterol: 100 mg/dL — ABNORMAL HIGH (ref 0–99)
NONHDL: 112.81
TRIGLYCERIDES: 62 mg/dL (ref 0.0–149.0)
VLDL: 12.4 mg/dL (ref 0.0–40.0)

## 2017-01-10 LAB — MICROALBUMIN / CREATININE URINE RATIO
CREATININE, U: 165.5 mg/dL
Microalb Creat Ratio: 3.6 mg/g (ref 0.0–30.0)
Microalb, Ur: 6 mg/dL — ABNORMAL HIGH (ref 0.0–1.9)

## 2017-01-10 LAB — CBC
HCT: 44.6 % (ref 39.0–52.0)
Hemoglobin: 14.6 g/dL (ref 13.0–17.0)
MCHC: 32.7 g/dL (ref 30.0–36.0)
MCV: 88.4 fl (ref 78.0–100.0)
PLATELETS: 216 10*3/uL (ref 150.0–400.0)
RBC: 5.04 Mil/uL (ref 4.22–5.81)
RDW: 15.5 % (ref 11.5–15.5)
WBC: 6.6 10*3/uL (ref 4.0–10.5)

## 2017-01-10 LAB — COMPREHENSIVE METABOLIC PANEL
ALBUMIN: 4.4 g/dL (ref 3.5–5.2)
ALK PHOS: 30 U/L — AB (ref 39–117)
ALT: 25 U/L (ref 0–53)
AST: 31 U/L (ref 0–37)
BUN: 17 mg/dL (ref 6–23)
CO2: 30 meq/L (ref 19–32)
Calcium: 9.1 mg/dL (ref 8.4–10.5)
Chloride: 104 mEq/L (ref 96–112)
Creatinine, Ser: 1.07 mg/dL (ref 0.40–1.50)
GFR: 70.75 mL/min (ref >60.00)
GLUCOSE: 139 mg/dL — AB (ref 70–99)
POTASSIUM: 4 meq/L (ref 3.5–5.1)
SODIUM: 141 meq/L (ref 135–145)
TOTAL PROTEIN: 7.5 g/dL (ref 6.0–8.3)
Total Bilirubin: 0.6 mg/dL (ref 0.2–1.2)

## 2017-01-10 LAB — HEMOGLOBIN A1C: HEMOGLOBIN A1C: 7.6 % — AB (ref 4.6–6.5)

## 2017-01-10 LAB — TSH: TSH: 0.86 u[IU]/mL (ref 0.35–4.50)

## 2017-01-10 LAB — VITAMIN D 25 HYDROXY (VIT D DEFICIENCY, FRACTURES): VITD: 27.51 ng/mL — ABNORMAL LOW (ref 30.00–100.00)

## 2017-01-10 NOTE — Assessment & Plan Note (Signed)
Well controlled, no changes to meds. Encouraged heart healthy diet such as the DASH diet and exercise as tolerated.  °

## 2017-01-10 NOTE — Progress Notes (Signed)
Subjective:  I acted as a Education administrator for Dr. Charlett Blake. [name] White, [place]  Patient ID: [name] White, male    DOB: 24-Jan-1936, 81 y.o.   MRN: [id number]  No chief complaint on file.   HPI  Patient is in today for an annual exam and follow up on chronic Medical conditions including diabetes mellitus, atrial fibrillation, hyperlipidemia, hypertension and anemia. No recent illness. No fevers or acute hospitalizations. He reports feeling well. He is staying active working in his yard and doing well with activities of daily living. He reports a good appetite and a heart healthy diet. Denies CP/palp/SOB/HA/congestion/fevers/GI or GU c/o. Taking meds as prescribed  Patient Care Team: Mosie Lukes, MD as PCP - General (Family Medicine) Mercy Moore, OD as Consulting Physician (Optometry) Bensimhon, Shaune Pascal, MD as Consulting Physician (Cardiology) Gaynelle Arabian, MD as Consulting Physician (Orthopedic Surgery) Kerrie Buffalo, MD as Consulting Physician (Cardiology) Henreitta Cea, MD as Consulting Physician (Family Medicine) Garry Heater, DDS (Dentistry)   Past Medical History:  Diagnosis Date  . Anemia 06/04/2015  . Anxiety   . Atrial flutter ([place])    s/p ablation 07/17/08; echo EF 60-65% trivival AI  . Bradycardia   . Chicken pox as a child  . Complication of anesthesia    "during ablation-heard people talking and he was groaning"  . Coronary artery disease    "slight"  . Diabetes mellitus type 2 in obese [facility]) 04/10/2009   Qualifier: Diagnosis of  By: Arnoldo Morale MD, Balinda Quails   . Dysrhythmia    a-fib.   . Emphysema of lung ([place])   . GERD (gastroesophageal reflux disease)   . Glucose intolerance (impaired glucose tolerance)   . H/O hiatal hernia   . Heart murmur    "slight"  . Hepatitis    h/o of type A  . Hypertension   . Hypothyroidism   . Measles as a child  . Micturition syncope   . Mumps as a child  . Osteoarthritis   . Preventative health care 12/10/2015  . Sleep apnea   .  Vitamin D deficiency 11/24/2015    Past Surgical History:  Procedure Laterality Date  . CARDIAC CATHETERIZATION    . CARDIAC ELECTROPHYSIOLOGY MAPPING AND ABLATION  06/2008  . CATARACT EXTRACTION W/ INTRAOCULAR LENS  IMPLANT, BILATERAL  ~ 2007  . COLONOSCOPY    . FOOT TENDON SURGERY Left   . TONSILLECTOMY  1940's  . TOTAL KNEE ARTHROPLASTY  1990's   right  . TOTAL KNEE ARTHROPLASTY Left 08/02/2013   Procedure: LEFT TOTAL KNEE ARTHROPLASTY;  Surgeon: Gearlean Alf, MD;  Location: [facility];  Service: Orthopedics;  Laterality: Left;  . WISDOM TOOTH EXTRACTION      Family History  Problem Relation Age of Onset  . Multiple sclerosis Father   . Heart disease Mother        atrial fibrillation  . Hypertension Son   . Heart disease Maternal Grandfather     Social History   Social History  . Marital status: Married    Spouse name: N/A  . Number of children: N/A  . Years of education: N/A   Occupational History  . Not on file.   Social History Main Topics  . Smoking status: Former Smoker    Years: 0.00    Types: Pipe, Cigars    Quit date: 04/01/1996  . Smokeless tobacco: Former Systems developer    Types: Chew    Quit date: 04/01/2009  . Alcohol use 0.0 oz/week  Comment: red wine x2 daily  . Drug use: No  . Sexual activity: Not Currently     Comment: lives with wife, eats clean diet. No major dietary restrtictions,    Other Topics Concern  . Not on file   Social History Narrative  . No narrative on file    Outpatient Medications Prior to Visit  Medication Sig Dispense Refill  . amLODipine (NORVASC) 10 MG tablet Take 1 tablet (10 mg total) by mouth every morning. 90 tablet 3  . cloNIDine (CATAPRES) 0.3 MG tablet Take 1 tablet (0.3 mg total) by mouth 2 (two) times daily. 190 tablet 1  . Coenzyme Q10 (COQ10 PO) Take by mouth.    . dofetilide (TIKOSYN) 500 MCG capsule Take 1 capsule (500 mcg total) by mouth every 12 (twelve) hours. 60 capsule 6  . ELIQUIS 5 MG TABS tablet TAKE 1  TABLET BY MOUTH TWICE A DAY 180 tablet 3  . fenofibrate (TRICOR) 145 MG tablet TAKE 1 TABLET (145 MG TOTAL) BY MOUTH DAILY. 90 tablet 1  . finasteride (PROSCAR) 5 MG tablet Take 5 mg by mouth daily.     [name] HYDROcodone-acetaminophen (NORCO/VICODIN) 5-325 MG tablet Take 1 tablet by mouth daily as needed.    [name] levothyroxine (SYNTHROID, LEVOTHROID) 175 MCG tablet TAKE 1 TABLET (175 MCG TOTAL) BY MOUTH DAILY BEFORE BREAKFAST. 90 tablet 1  . Multiple Vitamins-Minerals (CENTRUM ADULTS PO) Take by mouth.    [name] omeprazole (PRILOSEC) 20 MG capsule Take 20 mg by mouth daily.     [name] PARoxetine (PAXIL) 40 MG tablet Take 1 tablet (40 mg total) by mouth every morning. 90 tablet 1  . fenofibrate (TRICOR) 145 MG tablet TAKE 1 TABLET (145 MG TOTAL) BY MOUTH DAILY. 90 tablet 1  . Vitamin D, Ergocalciferol, (DRISDOL) 50000 units CAPS capsule TAKE 1 CAPSULE (50,000 UNITS TOTAL) BY MOUTH EVERY 7 (SEVEN) DAYS. 12 capsule 1   No facility-administered medications prior to visit.     Allergies  Allergen Reactions  . Codeine Itching and Rash  . Statins Other (See Comments)    Made very sick, doctor thought had heart attack; "my appearance, color, etc"  . Ace Inhibitors Other (See Comments)    Unknown reaction  . Benazepril Other (See Comments)    Unknown reaction  . Piroxicam Hives  . Pseudoephedrine     Can't take because it interacts with dofetilide     Review of Systems  Constitutional: Negative for fever and malaise/fatigue.  HENT: Negative for congestion.   Eyes: Negative for blurred vision.  Respiratory: Negative for cough and shortness of breath.   Cardiovascular: Negative for chest pain, palpitations and leg swelling.  Gastrointestinal: Negative for vomiting.  Musculoskeletal: Negative for back pain.  Skin: Negative for rash.  Neurological: Negative for loss of consciousness and headaches.       Objective:    Physical Exam  Constitutional: He is oriented to person, place, and time. He appears  well-developed and well-nourished. No distress.  HENT:  Head: Normocephalic and atraumatic.  Eyes: Conjunctivae are normal.  Neck: Normal range of motion. No thyromegaly present.  Cardiovascular: Normal rate.   Murmur heard. Irregularly irregular  Pulmonary/Chest: Effort normal and breath sounds normal. He has no wheezes.  Abdominal: Soft. Bowel sounds are normal. There is no tenderness.  Musculoskeletal: Normal range of motion. He exhibits no edema or deformity.  Neurological: He is alert and oriented to person, place, and time.  Skin: Skin is warm and dry. He is not diaphoretic.  Psychiatric: He has a normal mood and affect.    BP 124/62 (BP Location: Left Arm, Patient Position: Sitting, Cuff Size: Normal)   Pulse 61   Temp 98 F (36.7 C) (Oral)   Resp 18   Ht 6\' 3"  (1.905 m)   Wt 266 lb (120.7 kg)   SpO2 95%   BMI 33.25 kg/m  Wt Readings from Last 3 Encounters:  01/10/17 266 lb (120.7 kg)  07/29/16 246 lb 9.6 oz (111.9 kg)  03/28/16 245 lb 4 oz (111.2 kg)   BP Readings from Last 3 Encounters:  01/10/17 124/62  07/29/16 (!) 104/54  03/28/16 130/78     Immunization History  Administered Date(s) Administered  . Influenza Split 12/31/2011  . Influenza Whole 01/11/2009, 02/08/2010  . Influenza, High Dose Seasonal PF 01/12/2013, 11/24/2015, 01/10/2017  . Influenza,inj,Quad PF,6+ Mos 02/01/2014, 12/15/2014  . Pneumococcal Conjugate-13 11/24/2015  . Pneumococcal Polysaccharide-23 10/29/2008  . Td 04/30/2008    Health Maintenance  Topic Date Due  . INFLUENZA VACCINE  10/30/2016  . HEMOGLOBIN A1C  01/28/2017  . FOOT EXAM  07/29/2017  . URINE MICROALBUMIN  07/29/2017  . OPHTHALMOLOGY EXAM  11/11/2017  . TETANUS/TDAP  04/30/2018  . PNA vac Low Risk Adult  Completed    Lab Results  Component Value Date   WBC 5.4 07/29/2016   HGB 13.9 07/29/2016   HCT 41.6 07/29/2016   PLT 196.0 07/29/2016   GLUCOSE 124 (H) 07/29/2016   CHOL 184 07/29/2016   TRIG 57.0 07/29/2016    HDL 89.80 07/29/2016   LDLDIRECT 141.6 11/12/2010   LDLCALC 83 07/29/2016   ALT 25 07/29/2016   AST 36 07/29/2016   NA 138 07/29/2016   K 4.0 07/29/2016   CL 103 07/29/2016   CREATININE 1.01 07/29/2016   BUN 25 (H) 07/29/2016   CO2 27 07/29/2016   TSH 0.36 07/29/2016   PSA 1.42 09/13/2013   INR 1.01 07/26/2013   HGBA1C 6.6 (H) 07/29/2016   MICROALBUR 1.7 07/29/2016    Lab Results  Component Value Date   TSH 0.36 07/29/2016   Lab Results  Component Value Date   WBC 5.4 07/29/2016   HGB 13.9 07/29/2016   HCT 41.6 07/29/2016   MCV 88.4 07/29/2016   PLT 196.0 07/29/2016   Lab Results  Component Value Date   NA 138 07/29/2016   K 4.0 07/29/2016   CO2 27 07/29/2016   GLUCOSE 124 (H) 07/29/2016   BUN 25 (H) 07/29/2016   CREATININE 1.01 07/29/2016   BILITOT 0.6 07/29/2016   ALKPHOS 34 (L) 07/29/2016   AST 36 07/29/2016   ALT 25 07/29/2016   PROT 7.3 07/29/2016   ALBUMIN 4.4 07/29/2016   CALCIUM 9.8 07/29/2016   GFR 75.71 07/29/2016   Lab Results  Component Value Date   CHOL 184 07/29/2016   Lab Results  Component Value Date   HDL 89.80 07/29/2016   Lab Results  Component Value Date   LDLCALC 83 07/29/2016   Lab Results  Component Value Date   TRIG 57.0 07/29/2016   Lab Results  Component Value Date   CHOLHDL 2 07/29/2016   Lab Results  Component Value Date   HGBA1C 6.6 (H) 07/29/2016         Assessment & Plan:   Problem List Items Addressed This Visit    Hyperlipidemia    Encouraged heart healthy diet, increase exercise, avoid trans fats, consider a krill oil cap daily      Relevant Orders   Lipid  panel   Essential hypertension    Well controlled, no changes to meds. Encouraged heart healthy diet such as the DASH diet and exercise as tolerated.       Relevant Orders   CBC   Comprehensive metabolic panel   TSH   Diabetes mellitus type 2 in obese (HCC)    hgba1c acceptable, minimize simple carbs. Increase exercise as tolerated.  Continue current meds      Relevant Orders   Hemoglobin A1c   Microalbumin / creatinine urine ratio   Atrial fibrillation (HCC)    Rate controlled, tolerating Eliquis      Vitamin D deficiency    Check level today, daily Vitamin D 2000 IU daily.      Relevant Orders   VITAMIN D 25 Hydroxy (Vit-D Deficiency, Fractures)   Preventative health care    Patient encouraged to maintain heart healthy diet, regular exercise, adequate sleep. Consider daily probiotics. Take medications as prescribed       Other Visit Diagnoses    Needs flu shot    -  Primary   Relevant Orders   Flu vaccine HIGH DOSE PF (Fluzone High dose) (Completed)      I have discontinued Mr. Jolaine Click Vitamin D (Ergocalciferol). I am also having him maintain his omeprazole, finasteride, dofetilide, PARoxetine, Coenzyme Q10 (COQ10 PO), Multiple Vitamins-Minerals (CENTRUM ADULTS PO), cloNIDine, HYDROcodone-acetaminophen, ELIQUIS, fenofibrate, amLODipine, and levothyroxine.  No orders of the defined types were placed in this encounter.   CMA served as Education administrator during this visit. History, Physical and Plan performed by medical provider. Documentation and orders reviewed and attested to.  Penni Homans, MD

## 2017-01-10 NOTE — Assessment & Plan Note (Signed)
Encouraged heart healthy diet, increase exercise, avoid trans fats, consider a krill oil cap daily 

## 2017-01-10 NOTE — Assessment & Plan Note (Signed)
hgba1c acceptable, minimize simple carbs. Increase exercise as tolerated. Continue current meds 

## 2017-01-10 NOTE — Assessment & Plan Note (Signed)
Rate controlled, tolerating Eliquis 

## 2017-01-10 NOTE — Assessment & Plan Note (Signed)
Patient encouraged to maintain heart healthy diet, regular exercise, adequate sleep. Consider daily probiotics. Take medications as prescribed 

## 2017-01-10 NOTE — Assessment & Plan Note (Addendum)
Check level today, daily Vitamin D 2000 IU daily.

## 2017-01-10 NOTE — Patient Instructions (Addendum)
Vitamin D 2000 IU daily Advanced directive paperwork to Dr Rollen Sox is the new shingles, 2 shots over six months at [place] 79 Years and Older, Male Preventive care refers to lifestyle choices and visits with your health care provider that can promote health and wellness. What does preventive care include?  A yearly physical exam. This is also called an annual well check.  Dental exams once or twice a year.  Routine eye exams. Ask your health care provider how often you should have your eyes checked.  Personal lifestyle choices, including: ? Daily care of your teeth and gums. ? Regular physical activity. ? Eating a healthy diet. ? Avoiding tobacco and drug use. ? Limiting alcohol use. ? Practicing safe sex. ? Taking low doses of aspirin every day. ? Taking vitamin and mineral supplements as recommended by your health care provider. What happens during an annual well check? The services and screenings done by your health care provider during your annual well check will depend on your age, overall health, lifestyle risk factors, and family history of disease. Counseling Your health care provider may ask you questions about your:  Alcohol use.  Tobacco use.  Drug use.  Emotional well-being.  Home and relationship well-being.  Sexual activity.  Eating habits.  History of falls.  Memory and ability to understand (cognition).  Work and work Statistician.  Screening You may have the following tests or measurements:  Height, weight, and BMI.  Blood pressure.  Lipid and cholesterol levels. These may be checked every 5 years, or more frequently if you are over 79 years old.  Skin check.  Lung cancer screening. You may have this screening every year starting at age 79 if you have a 30-pack-year history of smoking and currently smoke or have quit within the past 15 years.  Fecal occult blood test (FOBT) of the stool. You may have this test every  year starting at age 79.  Flexible sigmoidoscopy or colonoscopy. You may have a sigmoidoscopy every 5 years or a colonoscopy every 10 years starting at age 79.  Prostate cancer screening. Recommendations will vary depending on your family history and other risks.  Hepatitis C blood test.  Hepatitis B blood test.  Sexually transmitted disease (STD) testing.  Diabetes screening. This is done by checking your blood sugar (glucose) after you have not eaten for a while (fasting). You may have this done every 1-3 years.  Abdominal aortic aneurysm (AAA) screening. You may need this if you are a current or former smoker.  Osteoporosis. You may be screened starting at age 79 if you are at high risk.  Talk with your health care provider about your test results, treatment options, and if necessary, the need for more tests. Vaccines Your health care provider may recommend certain vaccines, such as:  Influenza vaccine. This is recommended every year.  Tetanus, diphtheria, and acellular pertussis (Tdap, Td) vaccine. You may need a Td booster every 10 years.  Varicella vaccine. You may need this if you have not been vaccinated.  Zoster vaccine. You may need this after age 79.  Measles, mumps, and rubella (MMR) vaccine. You may need at least one dose of MMR if you were born in 1957 or later. You may also need a second dose.  Pneumococcal 13-valent conjugate (PCV13) vaccine. One dose is recommended after age 79.  Pneumococcal polysaccharide (PPSV23) vaccine. One dose is recommended after age 79.  Meningococcal vaccine. You may need this if you have certain  conditions.  Hepatitis A vaccine. You may need this if you have certain conditions or if you travel or work in places where you may be exposed to hepatitis A.  Hepatitis B vaccine. You may need this if you have certain conditions or if you travel or work in places where you may be exposed to hepatitis B.  Haemophilus influenzae type b  (Hib) vaccine. You may need this if you have certain risk factors.  Talk to your health care provider about which screenings and vaccines you need and how often you need them. This information is not intended to replace advice given to you by your health care provider. Make sure you discuss any questions you have with your health care provider. Document Released: 04/14/2015 Document Revised: 12/06/2015 Document Reviewed: 01/17/2015 Elsevier Interactive Patient Education  2017 [employer].

## 2017-01-14 MED ORDER — VITAMIN D (ERGOCALCIFEROL) 1.25 MG (50000 UNIT) PO CAPS: 50000.0000 [IU] | ORAL_CAPSULE | ORAL | 4 refills | Status: DC

## 2017-01-14 NOTE — Addendum Note (Signed)
Addended by: Magdalene Molly A on: 01/14/2017 05:06 PM   Modules accepted: Orders

## 2017-01-21 ENCOUNTER — Encounter: Payer: Self-pay | Admitting: Family Medicine

## 2017-03-03 DIAGNOSIS — Z96651 Presence of right artificial knee joint: Secondary | ICD-10-CM | POA: Diagnosis not present

## 2017-03-03 DIAGNOSIS — M25561 Pain in right knee: Secondary | ICD-10-CM | POA: Diagnosis not present

## 2017-04-03 ENCOUNTER — Other Ambulatory Visit: Payer: Self-pay | Admitting: Family Medicine

## 2017-05-20 DIAGNOSIS — G4733 Obstructive sleep apnea (adult) (pediatric): Secondary | ICD-10-CM | POA: Diagnosis not present

## 2017-05-30 ENCOUNTER — Other Ambulatory Visit: Payer: Self-pay

## 2017-05-30 ENCOUNTER — Emergency Department (HOSPITAL_BASED_OUTPATIENT_CLINIC_OR_DEPARTMENT_OTHER)
Admission: EM | Admit: 2017-05-30 | Discharge: 2017-05-30 | Disposition: A | Discharge status: Home / Self Care | Payer: Medicare Other | Attending: Emergency Medicine | Admitting: Emergency Medicine

## 2017-05-30 ENCOUNTER — Encounter (HOSPITAL_BASED_OUTPATIENT_CLINIC_OR_DEPARTMENT_OTHER): Payer: Self-pay | Admitting: Emergency Medicine

## 2017-05-30 DIAGNOSIS — I4891 Unspecified atrial fibrillation: Secondary | ICD-10-CM | POA: Diagnosis not present

## 2017-05-30 DIAGNOSIS — I2510 Atherosclerotic heart disease of native coronary artery without angina pectoris: Secondary | ICD-10-CM | POA: Diagnosis not present

## 2017-05-30 DIAGNOSIS — Z87891 Personal history of nicotine dependence: Secondary | ICD-10-CM | POA: Insufficient documentation

## 2017-05-30 DIAGNOSIS — E119 Type 2 diabetes mellitus without complications: Secondary | ICD-10-CM | POA: Insufficient documentation

## 2017-05-30 DIAGNOSIS — Z96653 Presence of artificial knee joint, bilateral: Secondary | ICD-10-CM | POA: Insufficient documentation

## 2017-05-30 DIAGNOSIS — Z79899 Other long term (current) drug therapy: Secondary | ICD-10-CM | POA: Diagnosis not present

## 2017-05-30 DIAGNOSIS — E039 Hypothyroidism, unspecified: Secondary | ICD-10-CM | POA: Insufficient documentation

## 2017-05-30 DIAGNOSIS — R202 Paresthesia of skin: Secondary | ICD-10-CM | POA: Diagnosis not present

## 2017-05-30 LAB — CBC WITH DIFFERENTIAL/PLATELET
BASOS ABS: 0.2 10*3/uL — AB (ref 0.0–0.1)
Basophils Relative: 2 %
Eosinophils Absolute: 0.3 10*3/uL (ref 0.0–0.7)
Eosinophils Relative: 5 %
HCT: 41 % (ref 39.0–52.0)
HEMOGLOBIN: 14.2 g/dL (ref 13.0–17.0)
LYMPHS ABS: 2 10*3/uL (ref 0.7–4.0)
LYMPHS PCT: 31 %
MCH: 29 pg (ref 26.0–34.0)
MCHC: 34.6 g/dL (ref 30.0–36.0)
MCV: 83.7 fL (ref 78.0–100.0)
Monocytes Absolute: 0.6 10*3/uL (ref 0.1–1.0)
Monocytes Relative: 9 %
NEUTROS PCT: 53 %
Neutro Abs: 3.5 10*3/uL (ref 1.7–7.7)
Platelets: 228 10*3/uL (ref 150–400)
RBC: 4.9 MIL/uL (ref 4.22–5.81)
RDW: 15.3 % (ref 11.5–15.5)
WBC: 6.6 10*3/uL (ref 4.0–10.5)

## 2017-05-30 LAB — TROPONIN I

## 2017-05-30 LAB — BASIC METABOLIC PANEL
Anion gap: 9 (ref 5–15)
BUN: 17 mg/dL (ref 6–20)
CO2: 23 mmol/L (ref 22–32)
Calcium: 9.2 mg/dL (ref 8.9–10.3)
Chloride: 103 mmol/L (ref 101–111)
Creatinine, Ser: 0.87 mg/dL (ref 0.61–1.24)
Glucose, Bld: 242 mg/dL — ABNORMAL HIGH (ref 65–99)
POTASSIUM: 3.5 mmol/L (ref 3.5–5.1)
SODIUM: 135 mmol/L (ref 135–145)

## 2017-05-30 LAB — CBG MONITORING, ED: Glucose-Capillary: 185 mg/dL — ABNORMAL HIGH (ref 65–99)

## 2017-05-30 NOTE — Discharge Instructions (Signed)
The cause of your symptoms was not identified today.  Please follow-up with your doctor for recheck.  Get reevaluated immediately if you develop any new or concerning symptoms.

## 2017-05-30 NOTE — ED Notes (Signed)
ED Provider at bedside. 

## 2017-05-30 NOTE — ED Triage Notes (Addendum)
Reports he was eating dinner @ 1740 tonight and then felt "tingly" all over.  States that he quit eating.  States "I feel like my legs don't work".  Denies dizziness, weakness, difficulty with vision, speech.

## 2017-05-30 NOTE — ED Notes (Signed)
Pt ambulatory to bathroom without difficulty.  

## 2017-05-30 NOTE — ED Provider Notes (Signed)
[facility] EMERGENCY DEPARTMENT Provider Note   CSN: [id number] Arrival date & time: 05/30/17  1745     History   Chief Complaint Chief Complaint  Patient presents with  . Tingling    HPI [name] White is a 82 y.o. male.  The history is provided by the patient. No language interpreter was used.    [name] White is a 82 y.o. male who presents to the Emergency Department complaining of tingling.  That 25 minutes prior to ED arrival he developed generalized body tingling and feeling shaky.  He was eating [place] fish .  He denies any associated headache, chest pain, abdominal pain, nausea, vomiting.  He feels like he is having some difficulty walking.  Overall symptoms are improving since they began.  No prior similar symptoms.  Past Medical History:  Diagnosis Date  . Anemia 06/04/2015  . Anxiety   . Atrial flutter ([place])    s/p ablation 07/17/08; echo EF 60-65% trivival AI  . Bradycardia   . Chicken pox as a child  . Complication of anesthesia    "during ablation-heard people talking and he was groaning"  . Coronary artery disease    "slight"  . Diabetes mellitus type 2 in obese [facility]) 04/10/2009   Qualifier: Diagnosis of  By: Arnoldo Morale MD, Balinda Quails   . Dysrhythmia    a-fib.   . Emphysema of lung ([place])   . GERD (gastroesophageal reflux disease)   . Glucose intolerance (impaired glucose tolerance)   . H/O hiatal hernia   . Heart murmur    "slight"  . Hepatitis    h/o of type A  . Hypertension   . Hypothyroidism   . Measles as a child  . Micturition syncope   . Mumps as a child  . Osteoarthritis   . Preventative health care 12/10/2015  . Sleep apnea   . Vitamin D deficiency 11/24/2015    Patient Active Problem List   Diagnosis Date Noted  . Preventative health care 12/10/2015  . Vitamin D deficiency 11/24/2015  . Anemia 06/04/2015  . Heart murmur 10/18/2014  . Measles   . Mumps   . Postoperative anemia due to acute blood loss 08/03/2013  . OA  (osteoarthritis) of knee 08/01/2013  . Allergic rhinitis 07/14/2013  . Biceps rupture, distal 09/16/2012  . Carotid bruit 01/06/2012  . Coronary atherosclerosis of native coronary artery 08/31/2011  . Atrial fibrillation ([place]) 06/25/2011  . Diabetes mellitus type 2 in obese ([place]) 04/10/2009  . Bradycardia 03/13/2009  . ATRIAL FLUTTER 11/22/2008  . Obstructive sleep apnea 08/29/2008  . Hypothyroidism 09/23/2006  . Hyperlipidemia 09/23/2006  . Depression with anxiety 09/23/2006  . Essential hypertension 09/23/2006  . GERD 09/23/2006    Past Surgical History:  Procedure Laterality Date  . CARDIAC CATHETERIZATION    . CARDIAC ELECTROPHYSIOLOGY MAPPING AND ABLATION  06/2008  . CATARACT EXTRACTION W/ INTRAOCULAR LENS  IMPLANT, BILATERAL  ~ 2007  . COLONOSCOPY    . FOOT TENDON SURGERY Left   . TONSILLECTOMY  1940's  . TOTAL KNEE ARTHROPLASTY  1990's   right  . TOTAL KNEE ARTHROPLASTY Left 08/02/2013   Procedure: LEFT TOTAL KNEE ARTHROPLASTY;  Surgeon: Gearlean Alf, MD;  Location: [facility];  Service: Orthopedics;  Laterality: Left;  . WISDOM TOOTH EXTRACTION         Home Medications    Prior to Admission medications   Medication Sig Start Date End Date Taking? Authorizing Provider  amLODipine (NORVASC) 10  MG tablet Take 1 tablet (10 mg total) by mouth every morning. 08/05/16   Mosie Lukes, MD  cloNIDine (CATAPRES) 0.3 MG tablet Take 1 tablet (0.3 mg total) by mouth 2 (two) times daily. 06/03/14   Mosie Lukes, MD  Coenzyme Q10 (COQ10 PO) Take by mouth.    [provider]  dofetilide (TIKOSYN) 500 MCG capsule Take 1 capsule (500 mcg total) by mouth every 12 (twelve) hours. 09/30/12   Bensimhon, Shaune Pascal, MD  ELIQUIS 5 MG TABS tablet TAKE 1 TABLET BY MOUTH TWICE A DAY 01/01/16   Bensimhon, Shaune Pascal, MD  fenofibrate (TRICOR) 145 MG tablet TAKE 1 TABLET (145 MG TOTAL) BY MOUTH DAILY. 04/03/17   Mosie Lukes, MD  finasteride (PROSCAR) 5 MG tablet Take 5 mg by mouth daily.      [provider]  HYDROcodone-acetaminophen (NORCO/VICODIN) 5-325 MG tablet Take 1 tablet by mouth daily as needed. 11/03/15   [provider]  levothyroxine (SYNTHROID, LEVOTHROID) 175 MCG tablet TAKE 1 TABLET (175 MCG TOTAL) BY MOUTH DAILY BEFORE BREAKFAST. 11/07/16   Mosie Lukes, MD  Multiple Vitamins-Minerals (CENTRUM ADULTS PO) Take by mouth.    [provider]  omeprazole (PRILOSEC) 20 MG capsule Take 20 mg by mouth daily.     [provider]  PARoxetine (PAXIL) 40 MG tablet Take 1 tablet (40 mg total) by mouth every morning. 09/14/13   Ricard Dillon, MD  Vitamin D, Ergocalciferol, (DRISDOL) 50000 units CAPS capsule Take 1 capsule (50,000 Units total) by mouth every 7 (seven) days. 01/14/17   Mosie Lukes, MD    Family History Family History  Problem Relation Age of Onset  . Multiple sclerosis Father   . Heart disease Mother        atrial fibrillation  . Hypertension Son   . Heart disease Maternal Grandfather     Social History Social History   Tobacco Use  . Smoking status: Former Smoker    Years: 0.00    Types: Pipe, Cigars    Last attempt to quit: 04/01/1996    Years since quitting: 21.1  . Smokeless tobacco: Former Systems developer    Types: Chew    Quit date: 04/01/2009  Substance Use Topics  . Alcohol use: Yes    Alcohol/week: 0.0 oz    Comment: red wine x2 daily  . Drug use: No     Allergies   Codeine; Statins; Ace inhibitors; Benazepril; Piroxicam; and Pseudoephedrine   Review of Systems Review of Systems  All other systems reviewed and are negative.    Physical Exam Updated Vital Signs BP (!) 166/82   Pulse (!) 52   Temp 98.4 F (36.9 C) (Oral)   Resp 14   Ht 6\' 3"  (1.905 m)   Wt 117.9 kg (260 lb)   SpO2 96%   BMI 32.50 kg/m   Physical Exam  Constitutional: He is oriented to person, place, and time. He appears well-developed and well-nourished.  HENT:  Head: Normocephalic and atraumatic.  Cardiovascular: Normal  rate and regular rhythm.  Murmur heard. Pulmonary/Chest: Effort normal and breath sounds normal. No respiratory distress.  Abdominal: Soft. There is no tenderness. There is no rebound and no guarding.  Musculoskeletal: He exhibits no edema or tenderness.  Neurological: He is alert and oriented to person, place, and time.  5 out of 5 strength in all 4 extremities with sensation light touch intact in all 4 extremities.  No pronator drift.  Skin: Skin is warm  and dry.  Psychiatric: He has a normal mood and affect. His behavior is normal.  Nursing note and vitals reviewed.    ED Treatments / Results  Labs (all labs ordered are listed, but only abnormal results are displayed) Labs Reviewed  CBC WITH DIFFERENTIAL/PLATELET - Abnormal; Notable for the following components:      Result Value   Basophils Absolute 0.2 (*)    All other components within normal limits  BASIC METABOLIC PANEL - Abnormal; Notable for the following components:   Glucose, Bld 242 (*)    All other components within normal limits  CBG MONITORING, ED - Abnormal; Notable for the following components:   Glucose-Capillary 185 (*)    All other components within normal limits  TROPONIN I    EKG  EKG Interpretation  Date/Time:  Friday May 30 2017 18:05:36 EST Ventricular Rate:  72 PR Interval:    QRS Duration: 118 QT Interval:  404 QTC Calculation: 442 R Axis:   -63 Text Interpretation:  Atrial fibrillation Left anterior fascicular block Left ventricular hypertrophy with QRS widening Possible Lateral infarct , age undetermined Abnormal ECG Confirmed by Quintella Reichert [phone]) on 05/30/2017 7:12:26 PM       Radiology No results found.  Procedures Procedures (including critical care time)  Medications Ordered in ED Medications - No data to display   Initial Impression / Assessment and Plan / ED Course  I have reviewed the triage vital signs and the nursing notes.  Pertinent labs & imaging results that  were available during my care of the patient were reviewed by me and considered in my medical decision making (see chart for details).     Patient here for evaluation of tingling and feeling poorly after eating fish.  He was observed in the emergency department and his symptoms resolved.  Presentation is not consistent with ACS, PE, CHF, anaphylaxis.  Unclear the etiology of his symptoms.  He is in atrial fibrillation in the emergency department but rate is controlled and EKG is similar compared to priors.  Patient is under the impression he is not in persistent atrial fibrillation but on record review from prior office visits he appears to have been in A. fib.  Discussed with patient PCP as well as cardiology follow-up and return precautions.  Final Clinical Impressions(s) / ED Diagnoses   Final diagnoses:  Paresthesia    ED Discharge Orders    None       Quintella Reichert, MD 05/30/17 2350

## 2017-05-30 NOTE — ED Notes (Signed)
Pt. Reports to RN while at early supper this evening he had an episode of not feeling well.  Pt. Michela Pitcher he had an episode of having a shaky feeling.   Pt. Reports his legs have been feeling weak for a while .  No change in pt. Visual disturbance reported.    Pt. Speech is clear and concise.

## 2017-07-21 ENCOUNTER — Ambulatory Visit (HOSPITAL_COMMUNITY)
Admission: RE | Admit: 2017-07-21 | Discharge: 2017-07-21 | Disposition: A | Discharge status: Home / Self Care | Payer: Medicare Other | Source: Ambulatory Visit | Attending: Internal Medicine | Admitting: Internal Medicine

## 2017-07-21 ENCOUNTER — Other Ambulatory Visit: Payer: Self-pay

## 2017-07-21 VITALS — BP 133/80 | HR 87 | Wt 276.0 lb

## 2017-07-21 DIAGNOSIS — E119 Type 2 diabetes mellitus without complications: Secondary | ICD-10-CM | POA: Diagnosis not present

## 2017-07-21 DIAGNOSIS — I481 Persistent atrial fibrillation: Secondary | ICD-10-CM | POA: Diagnosis not present

## 2017-07-21 DIAGNOSIS — Z6834 Body mass index (BMI) 34.0-34.9, adult: Secondary | ICD-10-CM | POA: Diagnosis not present

## 2017-07-21 DIAGNOSIS — I4892 Unspecified atrial flutter: Secondary | ICD-10-CM | POA: Insufficient documentation

## 2017-07-21 DIAGNOSIS — Z79890 Hormone replacement therapy (postmenopausal): Secondary | ICD-10-CM | POA: Diagnosis not present

## 2017-07-21 DIAGNOSIS — R001 Bradycardia, unspecified: Secondary | ICD-10-CM | POA: Insufficient documentation

## 2017-07-21 DIAGNOSIS — G4733 Obstructive sleep apnea (adult) (pediatric): Secondary | ICD-10-CM | POA: Diagnosis not present

## 2017-07-21 DIAGNOSIS — I10 Essential (primary) hypertension: Secondary | ICD-10-CM | POA: Diagnosis not present

## 2017-07-21 DIAGNOSIS — I480 Paroxysmal atrial fibrillation: Secondary | ICD-10-CM | POA: Diagnosis not present

## 2017-07-21 DIAGNOSIS — E039 Hypothyroidism, unspecified: Secondary | ICD-10-CM | POA: Diagnosis not present

## 2017-07-21 DIAGNOSIS — M1990 Unspecified osteoarthritis, unspecified site: Secondary | ICD-10-CM | POA: Diagnosis not present

## 2017-07-21 DIAGNOSIS — J439 Emphysema, unspecified: Secondary | ICD-10-CM | POA: Diagnosis not present

## 2017-07-21 DIAGNOSIS — E669 Obesity, unspecified: Secondary | ICD-10-CM | POA: Diagnosis not present

## 2017-07-21 DIAGNOSIS — Z79899 Other long term (current) drug therapy: Secondary | ICD-10-CM | POA: Insufficient documentation

## 2017-07-21 DIAGNOSIS — E785 Hyperlipidemia, unspecified: Secondary | ICD-10-CM | POA: Diagnosis not present

## 2017-07-21 DIAGNOSIS — I2510 Atherosclerotic heart disease of native coronary artery without angina pectoris: Secondary | ICD-10-CM | POA: Diagnosis not present

## 2017-07-21 DIAGNOSIS — R011 Cardiac murmur, unspecified: Secondary | ICD-10-CM | POA: Insufficient documentation

## 2017-07-21 DIAGNOSIS — Z7901 Long term (current) use of anticoagulants: Secondary | ICD-10-CM | POA: Insufficient documentation

## 2017-07-21 DIAGNOSIS — E559 Vitamin D deficiency, unspecified: Secondary | ICD-10-CM | POA: Insufficient documentation

## 2017-07-21 DIAGNOSIS — F419 Anxiety disorder, unspecified: Secondary | ICD-10-CM | POA: Insufficient documentation

## 2017-07-21 DIAGNOSIS — K219 Gastro-esophageal reflux disease without esophagitis: Secondary | ICD-10-CM | POA: Diagnosis not present

## 2017-07-21 DIAGNOSIS — I6523 Occlusion and stenosis of bilateral carotid arteries: Secondary | ICD-10-CM | POA: Insufficient documentation

## 2017-07-21 DIAGNOSIS — I5032 Chronic diastolic (congestive) heart failure: Secondary | ICD-10-CM | POA: Diagnosis not present

## 2017-07-21 DIAGNOSIS — I4819 Other persistent atrial fibrillation: Secondary | ICD-10-CM

## 2017-07-21 MED ORDER — SPIRONOLACTONE 25 MG PO TABS: 12.5000 mg | ORAL_TABLET | Freq: Every day | ORAL | 3 refills | Status: DC

## 2017-07-21 NOTE — Progress Notes (Signed)
Patient ID: [name] White, male   DOB: Oct 17, 1935, 82 y.o.   MRN: [id number] PCP: Benay Pillow Also sees the [facility] in [place]  HPI: [name] White is a delightful 82 year old male with a history of dyspnea with a normal Myoview in April 2007.  He also has a history of micturition syncope, hypertension, hyperlipidemia,borderline diabetes, obesity, sleep apnea. Paroxysmal atrial flutter and underwent a. flutter ablation by Dr. Lovena Le 06/2008. Developed PAF in 2013 which was quite symptomatic and placed on Tikosyn and Eliquis.   Cardiac CT in 07/2011 Left Main: No plaque or stenosis.  Left Anterior Descending: Prominent mixed plaque in the proximal  LAD with probably moderate (50-70%) stenosis. The first diagonal  was a small vessel with calcified plaque at the ostium (could be  significant stenosis but cannot say for sure due to blooming  artifact from the calcium).  Left Circumflex: Mixed plaque in the proximal LCx with mild  stenosis.  Right Coronary Artery: Mixed plaque in the mid RCA with mild  stenosis. Dominant vessel.  Coronary Calcium Score: 355 Agatston units   He was last seen in clinic in 09/2014. Overall doing well. Remains on Tikosyn for PAF. He has had worsening fatigue and BLE edema over the last year. Continues to exercise at the [facility] 5x/week with weight training and exercise bike. More fatigued over last 6-8 months. Has to take more rest breaks. No bleeding or palpitations. No CP, SOB, or dizziness. + LE edema. No orthopnea or PND. BPs at home 100-130s, HR 59-70s. In looking back at ECGs has been in AF since at least 05/30/17   Benazepril stopped due to angioedema after knee replacement. He suspects it may have been anesthetic but to be safe ACE-I was stopped. BP was running high so increased amlodipine to 10 in the m and 5mg  in am    Lab Results  Component Value Date   CHOL 187 01/10/2017   HDL 74.40 01/10/2017   LDLCALC 100 (H) 01/10/2017   LDLDIRECT 141.6 11/12/2010   TRIG 62.0  01/10/2017   CHOLHDL 3 01/10/2017   ROS: All systems negative except as listed in HPI, PMH and Problem List.  Past Medical History:  Diagnosis Date  . Anemia 06/04/2015  . Anxiety   . Atrial flutter ([place])    s/p ablation 07/17/08; echo EF 60-65% trivival AI  . Bradycardia   . Chicken pox as a child  . Complication of anesthesia    "during ablation-heard people talking and he was groaning"  . Coronary artery disease    "slight"  . Diabetes mellitus type 2 in obese [facility]) 04/10/2009   Qualifier: Diagnosis of  By: Arnoldo Morale MD, Balinda Quails   . Dysrhythmia    a-fib.   . Emphysema of lung ([place])   . GERD (gastroesophageal reflux disease)   . Glucose intolerance (impaired glucose tolerance)   . H/O hiatal hernia   . Heart murmur    "slight"  . Hepatitis    h/o of type A  . Hypertension   . Hypothyroidism   . Measles as a child  . Micturition syncope   . Mumps as a child  . Osteoarthritis   . Preventative health care 12/10/2015  . Sleep apnea   . Vitamin D deficiency 11/24/2015    Current Outpatient Medications  Medication Sig Dispense Refill  . amLODipine (NORVASC) 10 MG tablet Take 1 tablet (10 mg total) by mouth every morning. 90 tablet 3  . cloNIDine (CATAPRES) 0.3 MG tablet Take 1 tablet (  0.3 mg total) by mouth 2 (two) times daily. 190 tablet 1  . Coenzyme Q10 (COQ10 PO) Take by mouth.    . dofetilide (TIKOSYN) 500 MCG capsule Take 1 capsule (500 mcg total) by mouth every 12 (twelve) hours. 60 capsule 6  . ELIQUIS 5 MG TABS tablet TAKE 1 TABLET BY MOUTH TWICE A DAY 180 tablet 3  . fenofibrate (TRICOR) 145 MG tablet TAKE 1 TABLET (145 MG TOTAL) BY MOUTH DAILY. 90 tablet 1  . finasteride (PROSCAR) 5 MG tablet Take 5 mg by mouth daily.     [name] HYDROcodone-acetaminophen (NORCO/VICODIN) 5-325 MG tablet Take 1 tablet by mouth daily as needed.    [name] levothyroxine (SYNTHROID, LEVOTHROID) 175 MCG tablet TAKE 1 TABLET (175 MCG TOTAL) BY MOUTH DAILY BEFORE BREAKFAST. 90 tablet 1  . Multiple  Vitamins-Minerals (CENTRUM ADULTS PO) Take by mouth.    [name] omeprazole (PRILOSEC) 20 MG capsule Take 20 mg by mouth daily.     [name] PARoxetine (PAXIL) 40 MG tablet Take 1 tablet (40 mg total) by mouth every morning. 90 tablet 1  . Vitamin D, Ergocalciferol, (DRISDOL) 50000 units CAPS capsule Take 1 capsule (50,000 Units total) by mouth every 7 (seven) days. 4 capsule 4   No current facility-administered medications for this encounter.     Vitals:   07/21/17 1207 07/21/17 1216  BP:  133/80  Pulse: 87   SpO2: 97%   Weight: 276 lb (125.2 kg)     PHYSICAL EXAM: General: Well appearing. No resp difficulty. HEENT: Normal Neck: Supple. JVP 5-6. Carotids 2+ bilat; +bilat bruits (likely radiated from AoV). No thyromegaly or nodule noted. Cor: PMI nondisplaced. IRR, soft systolic murmur at RSB. S2 mildly reduced Lungs: CTAB, normal effort. Abdomen: Soft, non-tender, non-distended, no HSM. No bruits or masses. +BS  Extremities: No cyanosis, clubbing, or rash. R and LLE 2+ edema. TED hose on Neuro: Alert & orientedx3, cranial nerves grossly intact. moves all 4 extremities w/o difficulty. Affect pleasant   EKG: Atrial fibrillation 69 bpm. Personally reviewed    ASSESSMENT & PLAN:   1) CAD - No s/s ischemia. Last cardiac CT 2013 - Will continue statin. Not on ASA due to Eliquis. Not on BB with bradycardia  2) Atrial Flutter - s/p ablation 2010  3) Atrial fibrillation, paroxysmal - Continue Tikosyn and eliquis 5 mg BID.  4) Bradycardia -He is mildly bradycardic which has been chronic for him. I suspect part of this is due to good physical conditioning and also part due to intrinsic conduction system disease. Currently no indication for pacing or other intervention. Would avoid AV nodal blockers. Can cut back clonidine as needed as this may also have some HR effect.   5) HTN - BP under good control with current regimen. I am ok with using amlodipine up to 10 bid if needed. He gets dry  mouth with clonidine. We discussed a possible switch to Benicar (doubt he had angioedema from ACE-I and cross-over is lower) but he wants to wait on this.   - Not on BB with bradycardia - Continue amlodipine 10 mg daily, clonidine 0.3 mg BID  6) Carotid Stenosis - Carotid duplex 12/2013; stable 0-39% bilateral ICA stenosis - Repeat carotid US  7) Aortic valve murmur - Last echo 12/2011 showed aortic valve sclerosis without stenosis, no significant regurg.  8) OSA - compliant with CPAP  Echo   Georgiana Shore, NP 12:17 PM `  Patient seen and examined with the above-signed Advanced Practice Provider and/or  Housestaff. I personally reviewed laboratory data, imaging studies and relevant notes. I independently examined the patient and formulated the important aspects of the plan. I have edited the note to reflect any of my changes or salient points. I have personally discussed the plan with the patient and/or family.  Overall doing fairly well however has been having progressive exertional fatigue. By ECGs he has been back in AF since at least 05/30/17. Rate is well controlled. Unclear if AF causing fatigue or more related juts to natural aging. We discussed possibility of DC-CV but he would like to defer for now. Will stop Tikosyn. Also has AS murmur on exam likely mild to moderate. Will check echo. Mild volume overload start spiro 12.5. I will see him back in 2 months. If symptoms progressing can try another AAD with DC-CV. Clonidine may be adding to fatigue but he prefers not to switch.   Glori Bickers, MD  12:45 PM

## 2017-07-21 NOTE — Addendum Note (Signed)
Encounter addended by: Darron Doom, RN on: 07/21/2017 12:54 PM  Actions taken: Medication long-term status modified, Pharmacy for encounter modified, Diagnosis association updated, Order list changed, Sign clinical note

## 2017-07-21 NOTE — Patient Instructions (Signed)
STOP taking Tikosyn  START taking Spironolactone 12.5 mg (0.5 Tablet) Once Daily.  Echocardiogram has been ordered for you, we will schedule at checkout.  Labs in 1 week (bmet)   Follow up in 2-3 months with Dr. Haroldine Laws.

## 2017-07-29 ENCOUNTER — Ambulatory Visit (HOSPITAL_COMMUNITY)
Admission: RE | Admit: 2017-07-29 | Discharge: 2017-07-29 | Disposition: A | Discharge status: Home / Self Care | Payer: Medicare Other | Source: Ambulatory Visit | Attending: Cardiology | Admitting: Cardiology

## 2017-07-29 DIAGNOSIS — M7022 Olecranon bursitis, left elbow: Secondary | ICD-10-CM | POA: Diagnosis not present

## 2017-07-29 DIAGNOSIS — I5032 Chronic diastolic (congestive) heart failure: Secondary | ICD-10-CM | POA: Insufficient documentation

## 2017-07-29 LAB — BASIC METABOLIC PANEL
Anion gap: 11 (ref 5–15)
BUN: 14 mg/dL (ref 6–20)
CALCIUM: 9.4 mg/dL (ref 8.9–10.3)
CO2: 26 mmol/L (ref 22–32)
CREATININE: 1.08 mg/dL (ref 0.61–1.24)
Chloride: 101 mmol/L (ref 101–111)
GFR calc Af Amer: >60 mL/min (ref >60)
Glucose, Bld: 160 mg/dL — ABNORMAL HIGH (ref 65–99)
POTASSIUM: 4.4 mmol/L (ref 3.5–5.1)
SODIUM: 138 mmol/L (ref 135–145)

## 2017-07-31 ENCOUNTER — Ambulatory Visit: Payer: Medicare Other | Admitting: *Deleted

## 2017-07-31 ENCOUNTER — Ambulatory Visit: Payer: Self-pay | Admitting: *Deleted

## 2017-08-04 DIAGNOSIS — M7022 Olecranon bursitis, left elbow: Secondary | ICD-10-CM | POA: Diagnosis not present

## 2017-08-27 ENCOUNTER — Ambulatory Visit (HOSPITAL_COMMUNITY)
Admission: RE | Admit: 2017-08-27 | Discharge: 2017-08-27 | Disposition: A | Discharge status: Home / Self Care | Payer: Medicare Other | Source: Ambulatory Visit | Attending: Internal Medicine | Admitting: Internal Medicine

## 2017-08-27 DIAGNOSIS — I4891 Unspecified atrial fibrillation: Secondary | ICD-10-CM | POA: Insufficient documentation

## 2017-08-27 DIAGNOSIS — I4892 Unspecified atrial flutter: Secondary | ICD-10-CM | POA: Insufficient documentation

## 2017-08-27 DIAGNOSIS — I5032 Chronic diastolic (congestive) heart failure: Secondary | ICD-10-CM | POA: Diagnosis not present

## 2017-08-27 DIAGNOSIS — I083 Combined rheumatic disorders of mitral, aortic and tricuspid valves: Secondary | ICD-10-CM | POA: Diagnosis not present

## 2017-08-27 NOTE — Progress Notes (Signed)
  Echocardiogram 2D Echocardiogram has been performed.  Merrie Roof F 08/27/2017, 10:03 AM

## 2017-09-17 ENCOUNTER — Telehealth (HOSPITAL_COMMUNITY): Payer: Self-pay | Admitting: *Deleted

## 2017-09-17 DIAGNOSIS — I350 Nonrheumatic aortic (valve) stenosis: Secondary | ICD-10-CM

## 2017-09-17 NOTE — Telephone Encounter (Signed)
Bensimhon, Shaune Pascal, MD  Scarlette Calico, RN        Moderate AS lets repeat in 6-9 months     Pt aware of echo results

## 2017-09-24 ENCOUNTER — Telehealth: Payer: Self-pay | Admitting: Family Medicine

## 2017-09-24 ENCOUNTER — Other Ambulatory Visit: Payer: Self-pay

## 2017-09-24 MED ORDER — FENOFIBRATE 145 MG PO TABS: ORAL_TABLET | ORAL | 1 refills | Status: DC

## 2017-09-24 NOTE — Telephone Encounter (Signed)
Copied from [place]. Topic: Quick Communication - Rx Refill/Question >> Sep 24, 2017  9:35 AM Synthia Innocent wrote: Medication: fenofibrate (TRICOR) 145 MG tablet   Has the patient contacted their pharmacy? Yes.   (Agent: If no, request that the patient contact the pharmacy for the refill.) (Agent: If yes, when and what did the pharmacy advise?)  Preferred Pharmacy (with phone number or street name): [facility] [place]: Please be advised that RX refills may take up to 3 business days. We ask that you follow-up with your pharmacy.

## 2017-10-22 ENCOUNTER — Ambulatory Visit (HOSPITAL_COMMUNITY)
Admission: RE | Admit: 2017-10-22 | Discharge: 2017-10-22 | Disposition: A | Discharge status: Home / Self Care | Payer: Medicare Other | Source: Ambulatory Visit | Attending: Internal Medicine | Admitting: Internal Medicine

## 2017-10-22 VITALS — BP 122/66 | HR 66 | Wt 279.8 lb

## 2017-10-22 DIAGNOSIS — R001 Bradycardia, unspecified: Secondary | ICD-10-CM | POA: Insufficient documentation

## 2017-10-22 DIAGNOSIS — I4819 Other persistent atrial fibrillation: Secondary | ICD-10-CM

## 2017-10-22 DIAGNOSIS — I358 Other nonrheumatic aortic valve disorders: Secondary | ICD-10-CM | POA: Insufficient documentation

## 2017-10-22 DIAGNOSIS — E119 Type 2 diabetes mellitus without complications: Secondary | ICD-10-CM | POA: Insufficient documentation

## 2017-10-22 DIAGNOSIS — I481 Persistent atrial fibrillation: Secondary | ICD-10-CM | POA: Diagnosis not present

## 2017-10-22 DIAGNOSIS — I10 Essential (primary) hypertension: Secondary | ICD-10-CM | POA: Insufficient documentation

## 2017-10-22 DIAGNOSIS — I350 Nonrheumatic aortic (valve) stenosis: Secondary | ICD-10-CM

## 2017-10-22 DIAGNOSIS — R600 Localized edema: Secondary | ICD-10-CM | POA: Diagnosis not present

## 2017-10-22 DIAGNOSIS — I4892 Unspecified atrial flutter: Secondary | ICD-10-CM | POA: Insufficient documentation

## 2017-10-22 DIAGNOSIS — Z79890 Hormone replacement therapy (postmenopausal): Secondary | ICD-10-CM | POA: Insufficient documentation

## 2017-10-22 DIAGNOSIS — G4733 Obstructive sleep apnea (adult) (pediatric): Secondary | ICD-10-CM | POA: Diagnosis not present

## 2017-10-22 DIAGNOSIS — K219 Gastro-esophageal reflux disease without esophagitis: Secondary | ICD-10-CM | POA: Diagnosis not present

## 2017-10-22 DIAGNOSIS — I2510 Atherosclerotic heart disease of native coronary artery without angina pectoris: Secondary | ICD-10-CM | POA: Diagnosis not present

## 2017-10-22 DIAGNOSIS — E669 Obesity, unspecified: Secondary | ICD-10-CM | POA: Insufficient documentation

## 2017-10-22 DIAGNOSIS — I6529 Occlusion and stenosis of unspecified carotid artery: Secondary | ICD-10-CM | POA: Insufficient documentation

## 2017-10-22 DIAGNOSIS — Z79899 Other long term (current) drug therapy: Secondary | ICD-10-CM | POA: Diagnosis not present

## 2017-10-22 MED ORDER — POTASSIUM CHLORIDE CRYS ER 20 MEQ PO TBCR: 20.0000 meq | EXTENDED_RELEASE_TABLET | Freq: Every day | ORAL | 3 refills | Status: DC

## 2017-10-22 MED ORDER — FUROSEMIDE 40 MG PO TABS: ORAL_TABLET | ORAL | 3 refills | Status: DC

## 2017-10-22 MED ORDER — ROSUVASTATIN CALCIUM 5 MG PO TABS: 5.0000 mg | ORAL_TABLET | Freq: Every day | ORAL | 11 refills | Status: DC

## 2017-10-22 NOTE — Progress Notes (Signed)
CARDIOLOGY CLINIC NOTE  Patient ID: [name] White, male   DOB: [birth year], 82 y.o.   MRN: [id number] PCP: [name] White Also sees the [facility] [place] in [place]  HPI: [name] White is a delightful 81 year old male with a history of dyspnea with a normal Myoview in April 2007.  He also has a history of micturition syncope, hypertension, hyperlipidemia,borderline diabetes, obesity, sleep apnea. Paroxysmal atrial flutter and underwent a. flutter ablation by Dr. Lovena White 06/2008. Developed PAF in 2013 which was quite symptomatic and placed on Tikosyn and Eliquis.   Cardiac CT in 07/2011 Left Main: No plaque or stenosis.  Left Anterior Descending: Prominent mixed plaque in the proximal  LAD with probably moderate (50-70%) stenosis. The first diagonal  was a small vessel with calcified plaque at the ostium (could be  significant stenosis but cannot say for sure due to blooming  artifact from the calcium).  Left Circumflex: Mixed plaque in the proximal LCx with mild  stenosis.  Right Coronary Artery: Mixed plaque in the mid RCA with mild  stenosis. Dominant vessel.  Coronary Calcium Score: 355 Agatston units   He was last seen in clinic 2 months ago. At that time was back in AF. We stopped Tikosyn. He also had some White swelling and spiro 12.5 was added. Echo done EF 55-65% With moderate AS (mean gradient 22). Still going to [facility] 5x/week with weight training and exercise bike. Gets SOBwalking up hills. Has noticed more White edema and spiro has not helped. No CP or lightheadedness    Lab Results  Component Value Date   CHOL 187 01/10/2017   HDL 74.40 01/10/2017   LDLCALC 100 (H) 01/10/2017   LDLDIRECT 141.6 11/12/2010   TRIG 62.0 01/10/2017   CHOLHDL 3 01/10/2017   ROS: All systems negative except as listed in HPI, PMH and Problem List.  Past Medical History:  Diagnosis Date  . Anemia 06/04/2015  . Anxiety   . Atrial flutter ([place])    s/p ablation 07/17/08; echo EF 60-65% trivival AI  . Bradycardia   .  Chicken pox as a child  . Complication of anesthesia    "during ablation-heard people talking and he was groaning"  . Coronary artery disease    "slight"  . Diabetes mellitus type 2 in obese [facility]) 04/10/2009   Qualifier: Diagnosis of  By: [name] Morale MD, [name] White   . Dysrhythmia    a-fib.   . Emphysema of lung ([place])   . GERD (gastroesophageal reflux disease)   . Glucose intolerance (impaired glucose tolerance)   . H/O hiatal hernia   . Heart murmur    "slight"  . Hepatitis    h/o of type A  . Hypertension   . Hypothyroidism   . Measles as a child  . Micturition syncope   . Mumps as a child  . Osteoarthritis   . Preventative health care 12/10/2015  . Sleep apnea   . Vitamin D deficiency 11/24/2015    Current Outpatient Medications  Medication Sig Dispense Refill  . amLODipine (NORVASC) 10 MG tablet Take 1 tablet (10 mg total) by mouth every morning. 90 tablet 3  . cloNIDine (CATAPRES) 0.3 MG tablet Take 1 tablet (0.3 mg total) by mouth 2 (two) times daily. 190 tablet 1  . Coenzyme Q10 (COQ10 PO) Take by mouth.    [name] White 5 MG TABS tablet TAKE 1 TABLET BY MOUTH TWICE A DAY 180 tablet 3  . fenofibrate (TRICOR) 145 MG tablet TAKE 1 TABLET (145 MG TOTAL)  BY MOUTH DAILY. 90 tablet 1  . finasteride (PROSCAR) 5 MG tablet Take 5 mg by mouth daily.     [name] HYDROcodone-acetaminophen (NORCO/VICODIN) 5-325 MG tablet Take 1 tablet by mouth daily as needed.    [name] levothyroxine (SYNTHROID, LEVOTHROID) 175 MCG tablet TAKE 1 TABLET (175 MCG TOTAL) BY MOUTH DAILY BEFORE BREAKFAST. 90 tablet 1  . Multiple Vitamins-Minerals (CENTRUM ADULTS PO) Take by mouth.    [name] omeprazole (PRILOSEC) 20 MG capsule Take 20 mg by mouth daily.     [name] PARoxetine (PAXIL) 40 MG tablet Take 1 tablet (40 mg total) by mouth every morning. 90 tablet 1  . spironolactone (ALDACTONE) 25 MG tablet Take 0.5 tablets (12.5 mg total) by mouth daily. 45 tablet 3  . Vitamin D, Ergocalciferol, (DRISDOL) 50000 units CAPS capsule Take 1 capsule  (50,000 Units total) by mouth every 7 (seven) days. 4 capsule 4   No current facility-administered medications for this encounter.     Vitals:   10/22/17 0840  BP: 122/66  Pulse: 66  SpO2: 96%  Weight: 279 lb 12.8 oz (126.9 kg)    PHYSICAL EXAM: General:  Well appearing. No resp difficulty HEENT: normal Neck: supple. JVP 6-7. Carotids 2+ bilat; + bruits. No lymphadenopathy or thryomegaly appreciated. Cor: PMI nondisplaced. Irregular rate & rhythm. 2/6 AS s2 mildly decreased Lungs: clear Abdomen: soft, nontender, nondistended. No hepatosplenomegaly. No bruits or masses. Good bowel sounds. Extremities: no cyanosis, clubbing, rash, 2+ edema Neuro: alert & orientedx3, cranial nerves grossly intact. moves all 4 extremities w/o difficulty. Affect pleasant   EKG: Atrial fibrillation 65 bpm. IRBBB, LVH. Personally reviewed    ASSESSMENT & PLAN:   1) CAD - No s/s ischemia. Last cardiac CT 2013 with 50-70% LAD - Has not tolerated statins (has failed several) . Not on ASA due to Eliquis. Not on BB with bradycardia - Last LDL 100 - Will try Crestor 5mg  every other day  2) Atrial Flutter - s/p ablation 2010  3) Atrial fibrillation, permanent - Tikosyn stopped 5/17 due to persistent AF. Remains in AF today. Rate controlled. Well tolerated - Continue Eliquis 5 mg BID.  4) Bradycardia -He is mildly bradycardic which has been chronic for him. I suspect part of this is due to good physical conditioning and also part due to intrinsic conduction system disease. Currently no indication for pacing or other intervention. Would avoid AV nodal blockers. Can cut back clonidine as needed as this may also have some HR effect.   5) HTN - BP under good control with current regimen. I am ok with using amlodipine up to 10 bid if needed. He gets dry mouth with clonidine. We discussed a possible switch to Benicar (doubt he had angioedema from ACE-I and cross-over is lower) but he wants to wait on this.    - Not on BB with bradycardia - Continue amlodipine 10 mg daily, clonidine 0.3 mg BID  6) Carotid Stenosis - Carotid duplex 12/2013; stable 0-39% bilateral ICA stenosis - Repeat carotid US - Trying low-dose Crestor  7) Aortic valve murmur - echo with moderate AS. Follow with yearly echos. We discussed possible TAVR down the road  8) OSA - compliant with CPAP  9) White edema - ReDS 33% today - Start lasix 40mg  + KCL 20 M/W/F   Glori Bickers, MD 8:47 AM [name]

## 2017-10-22 NOTE — Patient Instructions (Signed)
Start lasix 40mg  Monday, Wednesday, Friday.   Start potassium 20 meq daily  Start Crestor 5mg  daily  Labwork in 2 weeks  Follow up with Dr. Haroldine Laws in 3 months.

## 2017-10-22 NOTE — Progress Notes (Signed)
ReDS Vest - 10/22/17 0900      ReDS Vest   MR   No    Fitting Posture  Sitting    Height Marker  -- Station D   Station D   Ruler Value  34.5    ReDS Value  33

## 2017-11-05 ENCOUNTER — Ambulatory Visit (HOSPITAL_COMMUNITY)
Admission: RE | Admit: 2017-11-05 | Discharge: 2017-11-05 | Disposition: A | Discharge status: Home / Self Care | Payer: Medicare Other | Source: Ambulatory Visit | Attending: Internal Medicine | Admitting: Internal Medicine

## 2017-11-05 DIAGNOSIS — I4819 Other persistent atrial fibrillation: Secondary | ICD-10-CM

## 2017-11-05 DIAGNOSIS — I481 Persistent atrial fibrillation: Secondary | ICD-10-CM | POA: Diagnosis not present

## 2017-11-05 LAB — BASIC METABOLIC PANEL
ANION GAP: 12 (ref 5–15)
BUN: 17 mg/dL (ref 8–23)
CHLORIDE: 99 mmol/L (ref 98–111)
CO2: 26 mmol/L (ref 22–32)
Calcium: 9.9 mg/dL (ref 8.9–10.3)
Creatinine, Ser: 1.14 mg/dL (ref 0.61–1.24)
GFR calc Af Amer: >60 mL/min (ref >60)
GFR, EST NON AFRICAN AMERICAN: 59 mL/min — AB (ref >60)
Glucose, Bld: 207 mg/dL — ABNORMAL HIGH (ref 70–99)
POTASSIUM: 4.1 mmol/L (ref 3.5–5.1)
Sodium: 137 mmol/L (ref 135–145)

## 2017-11-17 DIAGNOSIS — H47233 Glaucomatous optic atrophy, bilateral: Secondary | ICD-10-CM | POA: Diagnosis not present

## 2017-11-19 LAB — HM DIABETES EYE EXAM

## 2017-12-02 ENCOUNTER — Encounter: Payer: Self-pay | Admitting: Family Medicine

## 2017-12-18 ENCOUNTER — Telehealth: Payer: Self-pay | Admitting: Family Medicine

## 2017-12-18 DIAGNOSIS — I5022 Chronic systolic (congestive) heart failure: Secondary | ICD-10-CM

## 2017-12-18 MED ORDER — AMLODIPINE BESYLATE 10 MG PO TABS: 10.0000 mg | ORAL_TABLET | Freq: Every morning | ORAL | 0 refills | Status: DC

## 2017-12-18 NOTE — Telephone Encounter (Signed)
amlodipine refill Last Refill:08/05/16 # 90 3 RF Last OV: 01/10/17 PCP: Penni Homans MD Pharmacy:[facility]   Pt was due for follow up 07/11/17- will fill for 30 days

## 2017-12-18 NOTE — Telephone Encounter (Signed)
Copied from [place] [phone]. Topic: Quick Communication - Rx Refill/Question >> Dec 18, 2017  8:13 AM Alfredia Ferguson R wrote: Medication: amLODipine (NORVASC) 10 MG tablet  Has the patient contacted their pharmacy? Yes  Preferred Pharmacy (with phone number or street name): [id number] [place], [place] [facility] [address] [phone] (Phone) [phone] (Fax)    Agent: Please be advised that RX refills may take up to 3 business days. We ask that you follow-up with your pharmacy.

## 2018-01-09 ENCOUNTER — Other Ambulatory Visit: Payer: Self-pay | Admitting: Family Medicine

## 2018-01-09 DIAGNOSIS — I5022 Chronic systolic (congestive) heart failure: Secondary | ICD-10-CM

## 2018-01-14 ENCOUNTER — Other Ambulatory Visit: Payer: Self-pay | Admitting: Family Medicine

## 2018-01-14 DIAGNOSIS — I5022 Chronic systolic (congestive) heart failure: Secondary | ICD-10-CM

## 2018-01-22 ENCOUNTER — Other Ambulatory Visit: Payer: Self-pay

## 2018-01-22 ENCOUNTER — Ambulatory Visit (HOSPITAL_COMMUNITY)
Admission: RE | Admit: 2018-01-22 | Discharge: 2018-01-22 | Disposition: A | Discharge status: Home / Self Care | Payer: Medicare Other | Source: Ambulatory Visit | Attending: Internal Medicine | Admitting: Internal Medicine

## 2018-01-22 ENCOUNTER — Encounter (HOSPITAL_COMMUNITY): Payer: Self-pay | Admitting: Internal Medicine

## 2018-01-22 VITALS — BP 120/72 | HR 79 | Wt 278.2 lb

## 2018-01-22 DIAGNOSIS — Z79899 Other long term (current) drug therapy: Secondary | ICD-10-CM | POA: Diagnosis not present

## 2018-01-22 DIAGNOSIS — Z9889 Other specified postprocedural states: Secondary | ICD-10-CM | POA: Insufficient documentation

## 2018-01-22 DIAGNOSIS — E119 Type 2 diabetes mellitus without complications: Secondary | ICD-10-CM | POA: Diagnosis not present

## 2018-01-22 DIAGNOSIS — I5022 Chronic systolic (congestive) heart failure: Secondary | ICD-10-CM

## 2018-01-22 DIAGNOSIS — I358 Other nonrheumatic aortic valve disorders: Secondary | ICD-10-CM | POA: Diagnosis not present

## 2018-01-22 DIAGNOSIS — J439 Emphysema, unspecified: Secondary | ICD-10-CM | POA: Insufficient documentation

## 2018-01-22 DIAGNOSIS — Z79890 Hormone replacement therapy (postmenopausal): Secondary | ICD-10-CM | POA: Insufficient documentation

## 2018-01-22 DIAGNOSIS — I4821 Permanent atrial fibrillation: Secondary | ICD-10-CM | POA: Diagnosis not present

## 2018-01-22 DIAGNOSIS — I10 Essential (primary) hypertension: Secondary | ICD-10-CM

## 2018-01-22 DIAGNOSIS — I6523 Occlusion and stenosis of bilateral carotid arteries: Secondary | ICD-10-CM | POA: Insufficient documentation

## 2018-01-22 DIAGNOSIS — I4892 Unspecified atrial flutter: Secondary | ICD-10-CM | POA: Diagnosis not present

## 2018-01-22 DIAGNOSIS — I2510 Atherosclerotic heart disease of native coronary artery without angina pectoris: Secondary | ICD-10-CM | POA: Insufficient documentation

## 2018-01-22 DIAGNOSIS — K219 Gastro-esophageal reflux disease without esophagitis: Secondary | ICD-10-CM | POA: Insufficient documentation

## 2018-01-22 DIAGNOSIS — R001 Bradycardia, unspecified: Secondary | ICD-10-CM | POA: Insufficient documentation

## 2018-01-22 DIAGNOSIS — E559 Vitamin D deficiency, unspecified: Secondary | ICD-10-CM | POA: Diagnosis not present

## 2018-01-22 DIAGNOSIS — E669 Obesity, unspecified: Secondary | ICD-10-CM | POA: Insufficient documentation

## 2018-01-22 DIAGNOSIS — K449 Diaphragmatic hernia without obstruction or gangrene: Secondary | ICD-10-CM | POA: Diagnosis not present

## 2018-01-22 DIAGNOSIS — Z7901 Long term (current) use of anticoagulants: Secondary | ICD-10-CM | POA: Insufficient documentation

## 2018-01-22 DIAGNOSIS — E039 Hypothyroidism, unspecified: Secondary | ICD-10-CM | POA: Diagnosis not present

## 2018-01-22 DIAGNOSIS — F419 Anxiety disorder, unspecified: Secondary | ICD-10-CM | POA: Insufficient documentation

## 2018-01-22 DIAGNOSIS — G4733 Obstructive sleep apnea (adult) (pediatric): Secondary | ICD-10-CM | POA: Insufficient documentation

## 2018-01-22 DIAGNOSIS — I350 Nonrheumatic aortic (valve) stenosis: Secondary | ICD-10-CM

## 2018-01-22 LAB — COMPREHENSIVE METABOLIC PANEL
ALBUMIN: 4 g/dL (ref 3.5–5.0)
ALK PHOS: 29 U/L — AB (ref 38–126)
ALT: 18 U/L (ref 0–44)
ANION GAP: 8 (ref 5–15)
AST: 25 U/L (ref 15–41)
BILIRUBIN TOTAL: 0.7 mg/dL (ref 0.3–1.2)
BUN: 17 mg/dL (ref 8–23)
CALCIUM: 9.5 mg/dL (ref 8.9–10.3)
CO2: 25 mmol/L (ref 22–32)
Chloride: 103 mmol/L (ref 98–111)
Creatinine, Ser: 1.14 mg/dL (ref 0.61–1.24)
GFR calc Af Amer: >60 mL/min (ref >60)
GFR, EST NON AFRICAN AMERICAN: 59 mL/min — AB (ref >60)
GLUCOSE: 234 mg/dL — AB (ref 70–99)
Potassium: 4 mmol/L (ref 3.5–5.1)
Sodium: 136 mmol/L (ref 135–145)
TOTAL PROTEIN: 7 g/dL (ref 6.5–8.1)

## 2018-01-22 LAB — LIPID PANEL
CHOL/HDL RATIO: 3 ratio
Cholesterol: 171 mg/dL (ref 0–200)
HDL: 57 mg/dL (ref >40)
LDL Cholesterol: 94 mg/dL (ref 0–99)
TRIGLYCERIDES: 98 mg/dL (ref <150)
VLDL: 20 mg/dL (ref 0–40)

## 2018-01-22 MED ORDER — AMLODIPINE BESYLATE 10 MG PO TABS: 10.0000 mg | ORAL_TABLET | Freq: Two times a day (BID) | ORAL | 3 refills | Status: DC

## 2018-01-22 MED ORDER — ROSUVASTATIN CALCIUM 10 MG PO TABS: 5.0000 mg | ORAL_TABLET | Freq: Every day | ORAL | 3 refills | Status: DC

## 2018-01-22 NOTE — Addendum Note (Signed)
Encounter addended by: Harvie Junior, CMA on: 01/22/2018 9:36 AM  Actions taken: Pharmacy for encounter modified, Order list changed, Diagnosis association updated, Sign clinical note

## 2018-01-22 NOTE — Progress Notes (Signed)
CARDIOLOGY CLINIC NOTE  Patient ID: [name] White, male   DOB: [birth year], 82 y.o.   MRN: [id number] PCP: Benay Pillow Also sees the [place] in [place]  HPI: [name] White is a delightful 82 year old male with a history of dyspnea with a normal Myoview in April 2007.  He also has a history of micturition syncope, hypertension, hyperlipidemia,borderline diabetes, obesity, sleep apnea. Paroxysmal atrial flutter and underwent a. flutter ablation by Dr. Lovena Le 06/2008. Developed PAF in 2013 which was quite symptomatic and placed on Tikosyn and Eliquis.   Cardiac CT in 07/2011 Left Main: No plaque or stenosis.  Left Anterior Descending: Prominent mixed plaque in the proximal  LAD with probably moderate (50-70%) stenosis. The first diagonal  was a small vessel with calcified plaque at the ostium (could be  significant stenosis but cannot say for sure due to blooming  artifact from the calcium).  Left Circumflex: Mixed plaque in the proximal LCx with mild  stenosis.  Right Coronary Artery: Mixed plaque in the mid RCA with mild  stenosis. Dominant vessel.  Coronary Calcium Score: 355 Agatston units   Echo 5/19 EF 55-60% moderate AS mean gradient 22  He returns for routine f/u. Feels great. Very active in yard. No CP or SOB. BP has been up at times and has been taking extra amlodipine. No palpitations. No bleeding with Eliquis. Tolerating Crestor 5mg  daily. Ankles swellinh  Lab Results  Component Value Date   CHOL 187 01/10/2017   HDL 74.40 01/10/2017   LDLCALC 100 (H) 01/10/2017   LDLDIRECT 141.6 11/12/2010   TRIG 62.0 01/10/2017   CHOLHDL 3 01/10/2017   ROS: All systems negative except as listed in HPI, PMH and Problem List.  Past Medical History:  Diagnosis Date  . Anemia 06/04/2015  . Anxiety   . Atrial flutter ([place])    s/p ablation 07/17/08; echo EF 60-65% trivival AI  . Bradycardia   . Chicken pox as a child  . Complication of anesthesia    "during ablation-heard people talking and he  was groaning"  . Coronary artery disease    "slight"  . Diabetes mellitus type 2 in obese [facility]) 04/10/2009   Qualifier: Diagnosis of  By: Arnoldo Morale MD, Balinda Quails   . Dysrhythmia    a-fib.   . Emphysema of lung ([place])   . GERD (gastroesophageal reflux disease)   . Glucose intolerance (impaired glucose tolerance)   . H/O hiatal hernia   . Heart murmur    "slight"  . Hepatitis    h/o of type A  . Hypertension   . Hypothyroidism   . Measles as a child  . Micturition syncope   . Mumps as a child  . Osteoarthritis   . Preventative health care 12/10/2015  . Sleep apnea   . Vitamin D deficiency 11/24/2015    Current Outpatient Medications  Medication Sig Dispense Refill  . amLODipine (NORVASC) 10 MG tablet TAKE 1 TABLET BY MOUTH EVERY DAY IN THE MORNING 90 tablet 1  . cloNIDine (CATAPRES) 0.3 MG tablet Take 1 tablet (0.3 mg total) by mouth 2 (two) times daily. 190 tablet 1  . Coenzyme Q10 (COQ10 PO) Take by mouth.    [name] 5 MG TABS tablet TAKE 1 TABLET BY MOUTH TWICE A DAY 180 tablet 3  . fenofibrate (TRICOR) 145 MG tablet TAKE 1 TABLET (145 MG TOTAL) BY MOUTH DAILY. 90 tablet 1  . finasteride (PROSCAR) 5 MG tablet Take 5 mg by mouth daily.     [name]  furosemide (LASIX) 40 MG tablet Take 1 (40mg ) on Monday, Wednesday, and Friday 30 tablet 3  . HYDROcodone-acetaminophen (NORCO/VICODIN) 5-325 MG tablet Take 1 tablet by mouth daily as needed.    [name] levothyroxine (SYNTHROID, LEVOTHROID) 175 MCG tablet TAKE 1 TABLET (175 MCG TOTAL) BY MOUTH DAILY BEFORE BREAKFAST. 90 tablet 1  . Multiple Vitamins-Minerals (CENTRUM ADULTS PO) Take by mouth.    [name] omeprazole (PRILOSEC) 20 MG capsule Take 20 mg by mouth daily.     [name] PARoxetine (PAXIL) 40 MG tablet Take 1 tablet (40 mg total) by mouth every morning. 90 tablet 1  . potassium chloride SA (K-DUR,KLOR-CON) 20 MEQ tablet Take 1 tablet (20 mEq total) by mouth daily. 30 tablet 3  . rosuvastatin (CRESTOR) 5 MG tablet Take 1 tablet (5 mg total) by mouth at  bedtime. 30 tablet 11  . spironolactone (ALDACTONE) 25 MG tablet Take 0.5 tablets (12.5 mg total) by mouth daily. 45 tablet 3  . Vitamin D, Ergocalciferol, (DRISDOL) 50000 units CAPS capsule Take 1 capsule (50,000 Units total) by mouth every 7 (seven) days. 4 capsule 4   No current facility-administered medications for this encounter.     Vitals:   01/22/18 0900  BP: 120/72  Pulse: 79  SpO2: 97%  Weight: 126.2 kg (278 lb 3.2 oz)    PHYSICAL EXAM: General:  Well appearing. No resp difficulty HEENT: normal Neck: supple. no JVD. Carotids 2+ bilat; no bruits. No lymphadenopathy or thryomegaly appreciated. Cor: PMI nondisplaced. Irregular rate & rhythm. Soft SEM RUSB s2 ok Lungs: clear Abdomen: soft, nontender, nondistended. No hepatosplenomegaly. No bruits or masses. Good bowel sounds. Extremities: no cyanosis, clubbing, rash,trace edema Neuro: alert & orientedx3, cranial nerves grossly intact. moves all 4 extremities w/o difficulty. Affect pleasant   EKG: Not done today    ASSESSMENT & PLAN:   1) CAD - No s/s ischemia. Last cardiac CT 2013 with 50-70% LAD - Has not tolerated statins well (has failed several) . Not on ASA due to Eliquis. Not on BB with bradycardia - Now on Crestor 5mg  every day. Last LDL 100. Recheck today. Increase Crestor to 10 daily.  - Need to get LDL < 70. May need PCSK-9  2) Atrial Flutter - s/p ablation 2010  3) Atrial fibrillation, permanent - Tikosyn stopped 5/17 due to persistent AF. Remains in AF today. Rate controlled. Well tolerated - Continue Eliquis 5 mg BID.  4) Bradycardia -He is mildly bradycardic which has been chronic for him. I suspect part of this is due to good physical conditioning and also part due to intrinsic conduction system disease. Currently no indication for pacing or other intervention. Would avoid AV nodal blockers.   5) HTN - BP under good control with current regimen.  - Has to take extra amlodipine on regular basis  will increase to 10 bid - He gets dry mouth with clonidine. We discussed a possible switch to Benicar (doubt he had angioedema from ACE-I and cross-over is lower) but he wants to wait on this.   - Not on BB with bradycardia  6) Carotid Stenosis - Carotid duplex 12/2013; stable 0-39% bilateral ICA stenosis - Continue Crestor  7) Aortic valve murmur - echo 5/19 with moderate AS. Follow with yearly echos. We discussed possible TAVR down the road  8) OSA - compliant with CPAP    Glori Bickers, MD 9:19 AM [name]

## 2018-01-22 NOTE — Patient Instructions (Signed)
Routine lab work today. Will notify you of abnormal results  INCREASE Crestor to 10mg  daily.  INCREASE Amlodipine to 10mg  twice daily.  Follow up and echo in 12 months. Please call our office at [phone] in September to schedule your October appointment.

## 2018-01-22 NOTE — Addendum Note (Signed)
Encounter addended by: Harvie Junior, CMA on: 01/22/2018 9:40 AM  Actions taken: Order list changed, Diagnosis association updated

## 2018-01-29 DIAGNOSIS — G4733 Obstructive sleep apnea (adult) (pediatric): Secondary | ICD-10-CM | POA: Diagnosis not present

## 2018-02-03 ENCOUNTER — Other Ambulatory Visit (HOSPITAL_COMMUNITY): Payer: Self-pay

## 2018-02-03 DIAGNOSIS — I5022 Chronic systolic (congestive) heart failure: Secondary | ICD-10-CM

## 2018-02-03 MED ORDER — ROSUVASTATIN CALCIUM 10 MG PO TABS: 5.0000 mg | ORAL_TABLET | Freq: Every day | ORAL | 3 refills | Status: DC

## 2018-02-03 MED ORDER — AMLODIPINE BESYLATE 10 MG PO TABS: 10.0000 mg | ORAL_TABLET | Freq: Two times a day (BID) | ORAL | 3 refills | Status: DC

## 2018-03-17 ENCOUNTER — Other Ambulatory Visit: Payer: Self-pay | Admitting: Family Medicine

## 2018-03-18 ENCOUNTER — Other Ambulatory Visit (HOSPITAL_COMMUNITY): Payer: Self-pay

## 2018-03-18 MED ORDER — ROSUVASTATIN CALCIUM 10 MG PO TABS: 5.0000 mg | ORAL_TABLET | Freq: Every day | ORAL | 3 refills | Status: DC

## 2018-03-19 ENCOUNTER — Other Ambulatory Visit (HOSPITAL_COMMUNITY): Payer: Self-pay

## 2018-03-19 MED ORDER — ROSUVASTATIN CALCIUM 10 MG PO TABS: ORAL_TABLET | ORAL | 1 refills | Status: DC

## 2018-04-08 ENCOUNTER — Other Ambulatory Visit (HOSPITAL_COMMUNITY): Payer: Self-pay | Admitting: Internal Medicine

## 2018-05-11 ENCOUNTER — Telehealth: Payer: Self-pay

## 2018-05-11 NOTE — Telephone Encounter (Signed)
Okay to transfer to me.

## 2018-05-11 NOTE — Telephone Encounter (Signed)
Copied from [place] [id number]. Topic: Appointment Scheduling - Transfer of Care >> May 11, 2018  2:25 PM [name] wrote: Pt is requesting to transfer FROM: [name] White Pt is requesting to transfer TO: [name]  Reason for requested transfer: Patient is preferring a male doctor   Send CRM to patient's current PCP (transferring FROM).

## 2018-05-11 NOTE — Telephone Encounter (Signed)
OK with me.

## 2018-05-11 NOTE — Telephone Encounter (Signed)
Please advise 

## 2018-05-12 NOTE — Telephone Encounter (Signed)
I called and spoke to patient wife, appointment scheduled with Dr. Ethelene Hal for 05/19/2018 @ 11am.

## 2018-05-19 ENCOUNTER — Telehealth (HOSPITAL_COMMUNITY): Payer: Self-pay

## 2018-05-19 ENCOUNTER — Encounter: Payer: Self-pay | Admitting: Family Medicine

## 2018-05-19 ENCOUNTER — Ambulatory Visit (INDEPENDENT_AMBULATORY_CARE_PROVIDER_SITE_OTHER): Payer: Medicare Other | Admitting: Family Medicine

## 2018-05-19 VITALS — BP 128/78 | HR 72 | Ht 75.0 in | Wt 273.1 lb

## 2018-05-19 DIAGNOSIS — T887XXA Unspecified adverse effect of drug or medicament, initial encounter: Secondary | ICD-10-CM

## 2018-05-19 NOTE — Telephone Encounter (Signed)
Did he get dizzy? SBP 100s is okay as long as he is not symptomatic. It looks like they talked about switching him to a different medication for BP control. He cannot just stop taking clonidine - it has to be weaned off to prevent rebound hypertension.

## 2018-05-19 NOTE — Telephone Encounter (Signed)
Pt called to report that he is having some low bp. Last checked, it was 104/54. Pt states that DB talked about taking him off clonidine. Pt is currently taking 0.3 twice daily. Please advise.

## 2018-05-19 NOTE — Progress Notes (Signed)
Established Patient Office Visit  Subjective:  Patient ID: [name] White, male    DOB: [birth year]  Age: 83 y.o. MRN: [id number]  CC:  Chief Complaint  Patient presents with  . Establish Care    HPI [name] presents for a discussion of medication side effect.  Patient decided to discontinue his Crestor because he thought it was causing his lightheadedness and dizziness.  Patient has a significant medical history of "carotid artery and coronary artery disease.  He also has a history of hypertension that has been severe in the past.  Medical regimen was reviewed.  Difference of note is that he is taking Norvasc 10 mg one half p.o. twice daily.  He brings in a list of multiple blood pressures with systolic pressures running in the less than 120 range.  Of concern is diastolic pressures are running in the less than 70 range. Past Medical History:  Diagnosis Date  . Anemia 06/04/2015  . Anxiety   . Atrial flutter ([place])    s/p ablation 07/17/08; echo EF 60-65% trivival AI  . Bradycardia   . Chicken pox as a child  . Complication of anesthesia    "during ablation-heard people talking and he was groaning"  . Coronary artery disease    "slight"  . Diabetes mellitus type 2 in obese [facility]) 04/10/2009   Qualifier: Diagnosis of  By: Arnoldo Morale MD, Balinda Quails   . Dysrhythmia    a-fib.   . Emphysema of lung ([place])   . GERD (gastroesophageal reflux disease)   . Glucose intolerance (impaired glucose tolerance)   . H/O hiatal hernia   . Heart murmur    "slight"  . Hepatitis    h/o of type A  . Hypertension   . Hypothyroidism   . Measles as a child  . Micturition syncope   . Mumps as a child  . Osteoarthritis   . Preventative health care 12/10/2015  . Sleep apnea   . Vitamin D deficiency 11/24/2015    Past Surgical History:  Procedure Laterality Date  . CARDIAC CATHETERIZATION    . CARDIAC ELECTROPHYSIOLOGY MAPPING AND ABLATION  06/2008  . CATARACT EXTRACTION W/ INTRAOCULAR LENS   IMPLANT, BILATERAL  ~ 2007  . COLONOSCOPY    . FOOT TENDON SURGERY Left   . TONSILLECTOMY  1940's  . TOTAL KNEE ARTHROPLASTY  1990's   right  . TOTAL KNEE ARTHROPLASTY Left 08/02/2013   Procedure: LEFT TOTAL KNEE ARTHROPLASTY;  Surgeon: Gearlean Alf, MD;  Location: [facility];  Service: Orthopedics;  Laterality: Left;  . WISDOM TOOTH EXTRACTION      Family History  Problem Relation Age of Onset  . Multiple sclerosis Father   . Heart disease Mother        atrial fibrillation  . Hypertension Son   . Heart disease Maternal Grandfather     Social History   Socioeconomic History  . Marital status: Married    Spouse name: Not on file  . Number of children: Not on file  . Years of education: Not on file  . Highest education level: Not on file  Occupational History  . Not on file  Social Needs  . Financial resource strain: Not on file  . Food insecurity:    Worry: Not on file    Inability: Not on file  . Transportation needs:    Medical: Not on file    Non-medical: Not on file  Tobacco Use  . Smoking status: Former  Smoker    Years: 0.00    Types: Pipe, Cigars    Last attempt to quit: 04/01/1996    Years since quitting: 22.1  . Smokeless tobacco: Former Systems developer    Types: [place] date: 04/01/2009  Substance and Sexual Activity  . Alcohol use: Yes    Alcohol/week: 0.0 standard drinks    Comment: red wine x2 daily  . Drug use: No  . Sexual activity: Not Currently    Comment: lives with wife, eats clean diet. No major dietary restrtictions,   Lifestyle  . Physical activity:    Days per week: Not on file    Minutes per session: Not on file  . Stress: Not on file  Relationships  . Social connections:    Talks on phone: Not on file    Gets together: Not on file    Attends religious service: Not on file    Active member of club or organization: Not on file    Attends meetings of clubs or organizations: Not on file    Relationship status: Not on file  . Intimate partner  violence:    Fear of current or ex partner: Not on file    Emotionally abused: Not on file    Physically abused: Not on file    Forced sexual activity: Not on file  Other Topics Concern  . Not on file  Social History Narrative  . Not on file    Outpatient Medications Prior to Visit  Medication Sig Dispense Refill  . amLODipine (NORVASC) 10 MG tablet Take 1 tablet (10 mg total) by mouth 2 (two) times daily. 180 tablet 3  . cloNIDine (CATAPRES) 0.3 MG tablet Take 1 tablet (0.3 mg total) by mouth 2 (two) times daily. 190 tablet 1  . Coenzyme Q10 (COQ10 PO) Take by mouth.    [name] 5 MG TABS tablet TAKE 1 TABLET BY MOUTH TWICE A DAY 180 tablet 3  . fenofibrate (TRICOR) 145 MG tablet TAKE 1 TABLET BY MOUTH EVERY DAY 90 tablet 1  . finasteride (PROSCAR) 5 MG tablet Take 5 mg by mouth daily.     . furosemide (LASIX) 40 MG tablet Take 1 (40mg ) on Monday, Wednesday, and Friday 30 tablet 3  . HYDROcodone-acetaminophen (NORCO/VICODIN) 5-325 MG tablet Take 1 tablet by mouth daily as needed.    [name] KLOR-CON M20 20 MEQ tablet TAKE 1 TABLET BY MOUTH EVERY DAY 90 tablet 1  . levothyroxine (SYNTHROID, LEVOTHROID) 175 MCG tablet TAKE 1 TABLET (175 MCG TOTAL) BY MOUTH DAILY BEFORE BREAKFAST. 90 tablet 1  . Multiple Vitamins-Minerals (CENTRUM ADULTS PO) Take by mouth.    [name] omeprazole (PRILOSEC) 20 MG capsule Take 20 mg by mouth daily.     [name] PARoxetine (PAXIL) 40 MG tablet Take 1 tablet (40 mg total) by mouth every morning. 90 tablet 1  . spironolactone (ALDACTONE) 25 MG tablet Take 0.5 tablets (12.5 mg total) by mouth daily. 45 tablet 3  . Vitamin D, Ergocalciferol, (DRISDOL) 50000 units CAPS capsule Take 1 capsule (50,000 Units total) by mouth every 7 (seven) days. 4 capsule 4  . rosuvastatin (CRESTOR) 10 MG tablet Take 1 tab (10 mg total) once daily. 90 tablet 1   No facility-administered medications prior to visit.     Allergies  Allergen Reactions  . Codeine Itching and Rash  . Statins Other  (See Comments)    Made very sick, doctor thought had heart attack; "my appearance, color, etc"  . Ace  Inhibitors Other (See Comments)    Unknown reaction  . Benazepril Other (See Comments)    Unknown reaction  . Piroxicam Hives  . Pseudoephedrine     Can't take because it interacts with dofetilide     ROS Review of Systems  Constitutional: Negative.   Respiratory: Negative.   Cardiovascular: Negative.   Gastrointestinal: Negative.   Neurological: Positive for dizziness and light-headedness. Negative for weakness, numbness and headaches.  Psychiatric/Behavioral: Negative.       Objective:    Physical Exam  Constitutional: He is oriented to person, place, and time. He appears well-developed and well-nourished. No distress.  HENT:  Head: Normocephalic and atraumatic.  Right Ear: External ear normal.  Left Ear: External ear normal.  Eyes: Right eye exhibits no discharge. Left eye exhibits no discharge. No scleral icterus.  Neck: No JVD present. No tracheal deviation present.  Cardiovascular: Normal rate and regular rhythm.  Murmur heard. Pulmonary/Chest: Effort normal and breath sounds normal.  Neurological: He is alert and oriented to person, place, and time.  Skin: Skin is warm and dry. He is not diaphoretic.  Psychiatric: He has a normal mood and affect. His behavior is normal.    BP 128/78   Pulse 72   Ht 6\' 3"  (1.905 m)   Wt 273 lb 2 oz (123.9 kg)   SpO2 96%   BMI 34.14 kg/m  Wt Readings from Last 3 Encounters:  05/19/18 273 lb 2 oz (123.9 kg)  01/22/18 278 lb 3.2 oz (126.2 kg)  10/22/17 279 lb 12.8 oz (126.9 kg)   BP Readings from Last 3 Encounters:  05/19/18 128/78  01/22/18 120/72  10/22/17 122/66   Guideline developer:  UpToDate (see UpToDate for funding source) Date Released: June 2014  Health Maintenance Due  Topic Date Due  . HEMOGLOBIN A1C  07/11/2017  . FOOT EXAM  07/29/2017  . TETANUS/TDAP  04/30/2018    There are no preventive care  reminders to display for this patient.  Lab Results  Component Value Date   TSH 0.86 01/10/2017   Lab Results  Component Value Date   WBC 6.6 05/30/2017   HGB 14.2 05/30/2017   HCT 41.0 05/30/2017   MCV 83.7 05/30/2017   PLT 228 05/30/2017   Lab Results  Component Value Date   NA 136 01/22/2018   K 4.0 01/22/2018   CO2 25 01/22/2018   GLUCOSE 234 (H) 01/22/2018   BUN 17 01/22/2018   CREATININE 1.14 01/22/2018   BILITOT 0.7 01/22/2018   ALKPHOS 29 (L) 01/22/2018   AST 25 01/22/2018   ALT 18 01/22/2018   PROT 7.0 01/22/2018   ALBUMIN 4.0 01/22/2018   CALCIUM 9.5 01/22/2018   ANIONGAP 8 01/22/2018   GFR 70.75 01/10/2017   Lab Results  Component Value Date   CHOL 171 01/22/2018   Lab Results  Component Value Date   HDL 57 01/22/2018   Lab Results  Component Value Date   LDLCALC 94 01/22/2018   Lab Results  Component Value Date   TRIG 98 01/22/2018   Lab Results  Component Value Date   CHOLHDL 3.0 01/22/2018   Lab Results  Component Value Date   HGBA1C 7.6 (H) 01/10/2017      Assessment & Plan:   Problem List Items Addressed This Visit      Other   Medication side effect - Primary      No orders of the defined types were placed in this encounter.   Follow-up: No follow-ups on  file.   Do not believe that patient's reported side effect from Crestor is due to the Crestor.  I think that the more likely cause of his lightheadedness and dizziness is overtreated blood pressure.  Patient tells me that his cardiologist had been in favor of tapering the clonidine perhaps starting him on a beta-blocker.  Patient said that he had been resistant to this because he had been on the clonidine for so long.  Will now follow-up with his cardiologist and reconsider a clonidine taper with institution of a beta-blocker.

## 2018-05-20 NOTE — Telephone Encounter (Signed)
Left VM for pt to call clinic. 

## 2018-05-22 NOTE — Telephone Encounter (Signed)
Pt called back and verbalizes understanding. Not symptomatic and had 2 dizzy spells in the past.

## 2018-06-25 ENCOUNTER — Other Ambulatory Visit (HOSPITAL_COMMUNITY): Payer: Self-pay | Admitting: Internal Medicine

## 2018-07-24 DIAGNOSIS — G4733 Obstructive sleep apnea (adult) (pediatric): Secondary | ICD-10-CM | POA: Diagnosis not present

## 2018-08-19 DIAGNOSIS — M25511 Pain in right shoulder: Secondary | ICD-10-CM | POA: Diagnosis not present

## 2018-09-07 ENCOUNTER — Other Ambulatory Visit (HOSPITAL_COMMUNITY): Payer: Self-pay | Admitting: Internal Medicine

## 2018-09-30 ENCOUNTER — Other Ambulatory Visit (HOSPITAL_COMMUNITY): Payer: Self-pay | Admitting: Internal Medicine

## 2018-11-10 ENCOUNTER — Encounter (HOSPITAL_COMMUNITY): Payer: Self-pay

## 2018-11-10 ENCOUNTER — Other Ambulatory Visit: Payer: Self-pay

## 2018-11-10 ENCOUNTER — Ambulatory Visit (HOSPITAL_COMMUNITY)
Admission: RE | Admit: 2018-11-10 | Discharge: 2018-11-10 | Disposition: A | Discharge status: Home / Self Care | Payer: Medicare Other | Source: Ambulatory Visit | Attending: Cardiology | Admitting: Cardiology

## 2018-11-10 VITALS — BP 132/78 | HR 70 | Wt 231.2 lb

## 2018-11-10 DIAGNOSIS — I959 Hypotension, unspecified: Secondary | ICD-10-CM

## 2018-11-10 NOTE — Progress Notes (Signed)
Patient had an in office nurse visit for blood pressure check. Patient called the scheduling line yesterday to make his 80m follow up appointment and complained of recent low blood pressures.  Patient was 273lbs on 05/19/2018 today he is 231lbs. Patient was diagnosed as diabetic and has been working on losing weight.  As patient lost weight he adjusted his own medications.  Patient decreased Amlodipine 10mg  bid to 10mg  every night. Patient also decreased his clonidine from 0.3mg  bid to 0.1mg  daily.  Patient said he has still been experiencing low blood pressures (systolic bp 92'K and diastolic 46'K).  Patient states he can feel when his bp is low he gets light headed. Per Amy patient can stop clonidine, bring his blood pressure cuff to follow up appointment, and to call the office if he continues to experience low bp.  Patient aware and agreeable with plan.

## 2018-11-13 ENCOUNTER — Other Ambulatory Visit (HOSPITAL_COMMUNITY): Payer: Self-pay | Admitting: Cardiology

## 2018-11-13 MED ORDER — ROSUVASTATIN CALCIUM 10 MG PO TABS: 10.0000 mg | ORAL_TABLET | Freq: Every day | ORAL | 3 refills | Status: DC

## 2018-12-06 ENCOUNTER — Other Ambulatory Visit: Payer: Self-pay | Admitting: Family Medicine

## 2018-12-08 NOTE — Telephone Encounter (Signed)
Please advise 

## 2018-12-09 ENCOUNTER — Other Ambulatory Visit (HOSPITAL_COMMUNITY): Payer: Self-pay

## 2018-12-09 MED ORDER — FENOFIBRATE 145 MG PO TABS: ORAL_TABLET | ORAL | 1 refills | Status: DC

## 2018-12-15 DIAGNOSIS — L738 Other specified follicular disorders: Secondary | ICD-10-CM | POA: Diagnosis not present

## 2018-12-15 DIAGNOSIS — D485 Neoplasm of uncertain behavior of skin: Secondary | ICD-10-CM | POA: Diagnosis not present

## 2018-12-15 DIAGNOSIS — L814 Other melanin hyperpigmentation: Secondary | ICD-10-CM | POA: Diagnosis not present

## 2018-12-15 DIAGNOSIS — L821 Other seborrheic keratosis: Secondary | ICD-10-CM | POA: Diagnosis not present

## 2018-12-15 DIAGNOSIS — D1801 Hemangioma of skin and subcutaneous tissue: Secondary | ICD-10-CM | POA: Diagnosis not present

## 2018-12-15 DIAGNOSIS — L570 Actinic keratosis: Secondary | ICD-10-CM | POA: Diagnosis not present

## 2018-12-15 DIAGNOSIS — D225 Melanocytic nevi of trunk: Secondary | ICD-10-CM | POA: Diagnosis not present

## 2018-12-15 DIAGNOSIS — L820 Inflamed seborrheic keratosis: Secondary | ICD-10-CM | POA: Diagnosis not present

## 2018-12-31 ENCOUNTER — Other Ambulatory Visit: Payer: Self-pay

## 2018-12-31 ENCOUNTER — Ambulatory Visit (HOSPITAL_COMMUNITY)
Admission: RE | Admit: 2018-12-31 | Discharge: 2018-12-31 | Disposition: A | Discharge status: Home / Self Care | Payer: Medicare Other | Source: Ambulatory Visit | Attending: Internal Medicine | Admitting: Internal Medicine

## 2018-12-31 ENCOUNTER — Encounter (HOSPITAL_COMMUNITY): Payer: Self-pay | Admitting: Internal Medicine

## 2018-12-31 VITALS — BP 136/75 | HR 63 | Wt 237.4 lb

## 2018-12-31 DIAGNOSIS — K219 Gastro-esophageal reflux disease without esophagitis: Secondary | ICD-10-CM | POA: Insufficient documentation

## 2018-12-31 DIAGNOSIS — E039 Hypothyroidism, unspecified: Secondary | ICD-10-CM | POA: Insufficient documentation

## 2018-12-31 DIAGNOSIS — I350 Nonrheumatic aortic (valve) stenosis: Secondary | ICD-10-CM

## 2018-12-31 DIAGNOSIS — I4891 Unspecified atrial fibrillation: Secondary | ICD-10-CM | POA: Diagnosis not present

## 2018-12-31 DIAGNOSIS — I6523 Occlusion and stenosis of bilateral carotid arteries: Secondary | ICD-10-CM | POA: Insufficient documentation

## 2018-12-31 DIAGNOSIS — I10 Essential (primary) hypertension: Secondary | ICD-10-CM | POA: Diagnosis not present

## 2018-12-31 DIAGNOSIS — E119 Type 2 diabetes mellitus without complications: Secondary | ICD-10-CM | POA: Diagnosis not present

## 2018-12-31 DIAGNOSIS — G4733 Obstructive sleep apnea (adult) (pediatric): Secondary | ICD-10-CM | POA: Diagnosis not present

## 2018-12-31 DIAGNOSIS — Z79899 Other long term (current) drug therapy: Secondary | ICD-10-CM | POA: Insufficient documentation

## 2018-12-31 DIAGNOSIS — J439 Emphysema, unspecified: Secondary | ICD-10-CM | POA: Insufficient documentation

## 2018-12-31 DIAGNOSIS — Z7901 Long term (current) use of anticoagulants: Secondary | ICD-10-CM | POA: Diagnosis not present

## 2018-12-31 DIAGNOSIS — F419 Anxiety disorder, unspecified: Secondary | ICD-10-CM | POA: Insufficient documentation

## 2018-12-31 DIAGNOSIS — E785 Hyperlipidemia, unspecified: Secondary | ICD-10-CM | POA: Diagnosis not present

## 2018-12-31 DIAGNOSIS — R011 Cardiac murmur, unspecified: Secondary | ICD-10-CM | POA: Insufficient documentation

## 2018-12-31 DIAGNOSIS — C9210 Chronic myeloid leukemia, BCR/ABL-positive, not having achieved remission: Secondary | ICD-10-CM | POA: Diagnosis not present

## 2018-12-31 DIAGNOSIS — I4821 Permanent atrial fibrillation: Secondary | ICD-10-CM | POA: Insufficient documentation

## 2018-12-31 DIAGNOSIS — Z79890 Hormone replacement therapy (postmenopausal): Secondary | ICD-10-CM | POA: Insufficient documentation

## 2018-12-31 DIAGNOSIS — E669 Obesity, unspecified: Secondary | ICD-10-CM | POA: Insufficient documentation

## 2018-12-31 DIAGNOSIS — M1990 Unspecified osteoarthritis, unspecified site: Secondary | ICD-10-CM | POA: Diagnosis not present

## 2018-12-31 DIAGNOSIS — I2510 Atherosclerotic heart disease of native coronary artery without angina pectoris: Secondary | ICD-10-CM

## 2018-12-31 DIAGNOSIS — I4819 Other persistent atrial fibrillation: Secondary | ICD-10-CM

## 2018-12-31 NOTE — Progress Notes (Signed)
CARDIOLOGY CLINIC NOTE  Patient ID: [name] White, male   DOB: Jan 09, 1936, 83 y.o.   MRN: [id number] PCP: Benay Pillow Also sees the [place] in [place]  HPI: [name] White is a delightful 83 year old male with a history of non-obstructive CAD, micturition syncope, hypertension, hyperlipidemia,borderline diabetes, obesity, sleep apnea, atrial flutter s/p a. flutter ablation by Dr. Lovena Le 06/2008. Developed PAF in 2013 which was quite symptomatic and placed on Tikosyn and Eliquis. Eventually developed persistent AF.   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 Being treated at [facility]. Has lost over 40 pounds. BP down so clonidine stopped.    Here for routine f/u. Feels weak. No CP. SBP running 98-103. Feels OK. Mild SOB with exertion like walking up hills. Riding stationary bike 30 mins every am without too much problem. Edema has resolved. No orthopnea or PND     Cardiac studies:  Echo 5/19 EF 55-60% moderate AS mean gradient 22  Cardiac CT in 07/2011 Left Main: No plaque or stenosis.  Left Anterior Descending: Prominent mixed plaque in the proximal  LAD with probably moderate (50-70%) stenosis. The first diagonal  was a small vessel with calcified plaque at the ostium (could be  significant stenosis but cannot say for sure due to blooming  artifact from the calcium).  Left Circumflex: Mixed plaque in the proximal LCx with mild  stenosis.  Right Coronary Artery: Mixed plaque in the mid RCA with mild  stenosis. Dominant vessel.  Coronary Calcium Score: 355 Agatston units    Lab Results  Component Value Date   CHOL 171 01/22/2018   HDL 57 01/22/2018   LDLCALC 94 01/22/2018   LDLDIRECT 141.6 11/12/2010   TRIG 98 01/22/2018   CHOLHDL 3.0 01/22/2018   ROS: All systems negative except as listed in HPI, PMH and Problem List.  Past Medical History:  Diagnosis Date  . Anemia 06/04/2015  . Anxiety   . Atrial flutter ([place])    s/p ablation 07/17/08; echo EF 60-65% trivival AI  . Bradycardia   .  Chicken pox as a child  . Complication of anesthesia    "during ablation-heard people talking and he was groaning"  . Coronary artery disease    "slight"  . Diabetes mellitus type 2 in obese [facility]) 04/10/2009   Qualifier: Diagnosis of  By: Arnoldo Morale MD, Balinda Quails   . Dysrhythmia    a-fib.   . Emphysema of lung ([place])   . GERD (gastroesophageal reflux disease)   . Glucose intolerance (impaired glucose tolerance)   . H/O hiatal hernia   . Heart murmur    "slight"  . Hepatitis    h/o of type A  . Hypertension   . Hypothyroidism   . Measles as a child  . Micturition syncope   . Mumps as a child  . Osteoarthritis   . Preventative health care 12/10/2015  . Sleep apnea   . Vitamin D deficiency 11/24/2015    Current Outpatient Medications  Medication Sig Dispense Refill  . amLODipine (NORVASC) 10 MG tablet Take 10 mg by mouth daily.    . Biotin 10000 MCG TABS Take by mouth.    [name] CINNAMON PO Take by mouth.    . Coenzyme Q10 (COQ10 PO) Take by mouth.    . docusate sodium (COLACE) 100 MG capsule Take 100 mg by mouth 2 (two) times daily.    [name] ELIQUIS 5 MG TABS tablet TAKE 1 TABLET BY MOUTH TWICE A DAY 180 tablet 3  . fenofibrate (TRICOR) 145  MG tablet TAKE 1 TABLET BY MOUTH EVERY DAY 90 tablet 1  . finasteride (PROSCAR) 5 MG tablet Take 5 mg by mouth daily.     . furosemide (LASIX) 40 MG tablet TAKE 1 (40MG ) ON MONDAY, WEDNESDAY, AND FRIDAY 36 tablet 3  . levothyroxine (SYNTHROID, LEVOTHROID) 175 MCG tablet TAKE 1 TABLET (175 MCG TOTAL) BY MOUTH DAILY BEFORE BREAKFAST. 90 tablet 1  . Melatonin 10 MG TABS Take by mouth.    . Multiple Vitamins-Minerals (CENTRUM ADULTS PO) Take by mouth.    [name] omeprazole (PRILOSEC) 20 MG capsule Take 20 mg by mouth daily.     [name] PARoxetine (PAXIL) 40 MG tablet Take 1 tablet (40 mg total) by mouth every morning. 90 tablet 1  . potassium chloride SA (KLOR-CON M20) 20 MEQ tablet Take 1 tablet (20 mEq total) by mouth daily. Please call for Office visit [phone]  tablet 1  . rosuvastatin (CRESTOR) 10 MG tablet Take 1 tablet (10 mg total) by mouth daily. 90 tablet 3  . spironolactone (ALDACTONE) 25 MG tablet Take 0.5 tablets (12.5 mg total) by mouth daily. 45 tablet 3  . Turmeric 1053 MG TABS Take by mouth.    . cloNIDine (CATAPRES) 0.1 MG tablet Take 0.1 mg by mouth daily.     No current facility-administered medications for this encounter.     Vitals:   12/31/18 0952  BP: 136/75  Pulse: 63  SpO2: 93%  Weight: 107.7 kg (237 lb 6.4 oz)    PHYSICAL EXAM: General:  Well appearing. No resp difficulty HEENT: normal Neck: supple. JVP 6-7 . Carotids 2+ bilat; + bruits. No lymphadenopathy or thryomegaly appreciated. Cor: PMI nondisplaced. irregular rate & rhythm.2/6 AS s2 ok  Lungs: clear Abdomen: soft, nontender, nondistended. No hepatosplenomegaly. No bruits or masses. Good bowel sounds. Extremities: no cyanosis, clubbing, rash, edema Neuro: alert & orientedx3, cranial nerves grossly intact. moves all 4 extremities w/o difficulty. Affect pleasant  EKG:  AF 76 Personally reviewed  ASSESSMENT & PLAN:   1) CAD - No s/s ischemia. Last cardiac CT 2013 with 50-70% LAD - Continue Crestor 10. Not on ASA due to Eliquis. Not on BB with bradycardia  2) CML - [facility] with [facility] Don [facility]  3) Atrial fibrillation, permanent - Tikosyn stopped 5/17 due to persistent AF. - Remains in AF today. Rate controlled. Well tolerated - Continue Eliquis 5 mg BID.  4) HTN - BP low -> stop amlodipine  5) Carotid Stenosis - Carotid duplex 12/2013; stable 0-39% bilateral ICA stenosis - Continue Crestor - F/u as needed  7) Aortic valve murmur - echo 5/19 with moderate AS.  - will repeat echo   8) OSA - compliant with CPAP - may not need it any more with 40 pound weight loss    Glori Bickers, MD 10:28 AM [name]

## 2018-12-31 NOTE — Patient Instructions (Signed)
STOP Amlodipine  EKG in office today  Your physician has requested that you have an echocardiogram. Echocardiography is a painless test that uses sound waves to create images of your heart. It provides your doctor with information about the size and shape of your heart and how well your heart's chambers and valves are working. This procedure takes approximately one hour. There are no restrictions for this procedure.  Your physician recommends that you schedule a follow-up appointment in: 6 months. You will get a call from our office to schedule this appointment.   At the [facility], you and your health needs are our priority. As part of our continuing mission to provide you with exceptional heart care, we have created designated Provider Care Teams. These Care Teams include your primary Cardiologist (physician) and Advanced Practice Providers (APPs- Physician Assistants and Nurse Practitioners) who all work together to provide you with the care you need, when you need it.   You may see any of the following providers on your designated Care Team at your next follow up: [name] Dr Glori Bickers . Dr Loralie Champagne . Darrick Grinder, NP   Please be sure to bring in all your medications bottles to every appointment.

## 2019-01-19 ENCOUNTER — Telehealth (HOSPITAL_COMMUNITY): Payer: Self-pay | Admitting: *Deleted

## 2019-01-19 DIAGNOSIS — I4891 Unspecified atrial fibrillation: Secondary | ICD-10-CM

## 2019-01-19 NOTE — Telephone Encounter (Signed)
Pt called stating his oncologist wants to proceed with a different chemo treatment but it could cause fluid retention. They want to increase his lasix to 40mg  daily instead of 40mg  mon, wed, fri. Patient and oncology did not want to increase medication with out Dr.Bensimhons consent.  Oncologist # [id number] ext. [phone].    Routed to [place] for advice

## 2019-01-19 NOTE — Telephone Encounter (Signed)
Ok by me as long as they are following his labs.

## 2019-01-20 ENCOUNTER — Ambulatory Visit (HOSPITAL_COMMUNITY)
Admission: RE | Admit: 2019-01-20 | Discharge: 2019-01-20 | Disposition: A | Discharge status: Home / Self Care | Payer: Medicare Other | Source: Ambulatory Visit | Attending: Internal Medicine | Admitting: Internal Medicine

## 2019-01-20 ENCOUNTER — Other Ambulatory Visit: Payer: Self-pay

## 2019-01-20 DIAGNOSIS — I4891 Unspecified atrial fibrillation: Secondary | ICD-10-CM | POA: Insufficient documentation

## 2019-01-20 LAB — BASIC METABOLIC PANEL
Anion gap: 13 (ref 5–15)
BUN: 11 mg/dL (ref 8–23)
CO2: 23 mmol/L (ref 22–32)
Calcium: 9.3 mg/dL (ref 8.9–10.3)
Chloride: 103 mmol/L (ref 98–111)
Creatinine, Ser: 0.95 mg/dL (ref 0.61–1.24)
GFR calc Af Amer: >60 mL/min (ref >60)
GFR calc non Af Amer: >60 mL/min (ref >60)
Glucose, Bld: 153 mg/dL — ABNORMAL HIGH (ref 70–99)
Potassium: 4.3 mmol/L (ref 3.5–5.1)
Sodium: 139 mmol/L (ref 135–145)

## 2019-01-20 LAB — BRAIN NATRIURETIC PEPTIDE: B Natriuretic Peptide: 136.2 pg/mL — ABNORMAL HIGH (ref 0.0–100.0)

## 2019-01-20 MED ORDER — FUROSEMIDE 40 MG PO TABS: 40.0000 mg | ORAL_TABLET | Freq: Every day | ORAL | 3 refills | Status: DC

## 2019-01-20 NOTE — Telephone Encounter (Signed)
Spoke with patient during his echo visit he is aware also left detailed msg at oncologists office.

## 2019-01-20 NOTE — Progress Notes (Signed)
  Echocardiogram 2D Echocardiogram has been performed.  [name] White 01/20/2019, 10:03 AM

## 2019-01-25 DIAGNOSIS — L570 Actinic keratosis: Secondary | ICD-10-CM | POA: Diagnosis not present

## 2019-01-25 DIAGNOSIS — L738 Other specified follicular disorders: Secondary | ICD-10-CM | POA: Diagnosis not present

## 2019-01-25 DIAGNOSIS — L820 Inflamed seborrheic keratosis: Secondary | ICD-10-CM | POA: Diagnosis not present

## 2019-01-25 DIAGNOSIS — L821 Other seborrheic keratosis: Secondary | ICD-10-CM | POA: Diagnosis not present

## 2019-01-25 DIAGNOSIS — L814 Other melanin hyperpigmentation: Secondary | ICD-10-CM | POA: Diagnosis not present

## 2019-01-27 ENCOUNTER — Other Ambulatory Visit (HOSPITAL_COMMUNITY): Payer: Self-pay | Admitting: *Deleted

## 2019-01-27 MED ORDER — FUROSEMIDE 40 MG PO TABS: 40.0000 mg | ORAL_TABLET | Freq: Every day | ORAL | 3 refills | Status: DC

## 2019-02-21 ENCOUNTER — Other Ambulatory Visit (HOSPITAL_COMMUNITY): Payer: Self-pay | Admitting: Internal Medicine

## 2019-03-06 ENCOUNTER — Telehealth: Payer: Self-pay | Admitting: Cardiology

## 2019-03-06 NOTE — Telephone Encounter (Signed)
Pt's daughter in law called in reporting patient has had increased weight gain in his abd. Having worsening exertional shortness of breath. Followed by the [facility] clinic. He has attempted to increase his lasix from 40mg  daily to 60mg  daily for the past week and does not feel as though his symptoms are improving. Is urinating ok. DIL feels he retaining more fluid and concerned today as he seems more short of breath. I advised he needs to come in for evaluation to determine how much volume overload he has. Given he has tried to increase meds without significant relief may need IV lasix. Also needs labs to check K+ and renal function. Advised could present to [facility] or [facility] as he has hx of CML as well. She is hesitant to bring in but will consider as he is not improving with increased lasix dosage. May present to [facility], but I advised if he needs admission will need to transfer. She voiced understanding.

## 2019-03-09 ENCOUNTER — Emergency Department (HOSPITAL_BASED_OUTPATIENT_CLINIC_OR_DEPARTMENT_OTHER)
Admission: EM | Admit: 2019-03-09 | Discharge: 2019-03-09 | Disposition: A | Discharge status: Home / Self Care | Payer: Medicare Other | Attending: Emergency Medicine | Admitting: Emergency Medicine

## 2019-03-09 ENCOUNTER — Encounter (HOSPITAL_BASED_OUTPATIENT_CLINIC_OR_DEPARTMENT_OTHER): Payer: Self-pay

## 2019-03-09 ENCOUNTER — Emergency Department (HOSPITAL_BASED_OUTPATIENT_CLINIC_OR_DEPARTMENT_OTHER): Payer: Medicare Other

## 2019-03-09 ENCOUNTER — Other Ambulatory Visit: Payer: Self-pay

## 2019-03-09 DIAGNOSIS — R0602 Shortness of breath: Secondary | ICD-10-CM | POA: Diagnosis not present

## 2019-03-09 DIAGNOSIS — E782 Mixed hyperlipidemia: Secondary | ICD-10-CM | POA: Diagnosis not present

## 2019-03-09 DIAGNOSIS — Z7984 Long term (current) use of oral hypoglycemic drugs: Secondary | ICD-10-CM | POA: Diagnosis not present

## 2019-03-09 DIAGNOSIS — Z87891 Personal history of nicotine dependence: Secondary | ICD-10-CM | POA: Diagnosis not present

## 2019-03-09 DIAGNOSIS — Z885 Allergy status to narcotic agent status: Secondary | ICD-10-CM | POA: Insufficient documentation

## 2019-03-09 DIAGNOSIS — Z79899 Other long term (current) drug therapy: Secondary | ICD-10-CM | POA: Diagnosis not present

## 2019-03-09 DIAGNOSIS — Z888 Allergy status to other drugs, medicaments and biological substances status: Secondary | ICD-10-CM | POA: Diagnosis not present

## 2019-03-09 DIAGNOSIS — I5033 Acute on chronic diastolic (congestive) heart failure: Secondary | ICD-10-CM | POA: Insufficient documentation

## 2019-03-09 DIAGNOSIS — E039 Hypothyroidism, unspecified: Secondary | ICD-10-CM | POA: Diagnosis not present

## 2019-03-09 DIAGNOSIS — I2510 Atherosclerotic heart disease of native coronary artery without angina pectoris: Secondary | ICD-10-CM | POA: Insufficient documentation

## 2019-03-09 DIAGNOSIS — Z9861 Coronary angioplasty status: Secondary | ICD-10-CM | POA: Diagnosis not present

## 2019-03-09 DIAGNOSIS — I4891 Unspecified atrial fibrillation: Secondary | ICD-10-CM | POA: Diagnosis not present

## 2019-03-09 DIAGNOSIS — I110 Hypertensive heart disease with heart failure: Secondary | ICD-10-CM | POA: Insufficient documentation

## 2019-03-09 DIAGNOSIS — Z7901 Long term (current) use of anticoagulants: Secondary | ICD-10-CM | POA: Diagnosis not present

## 2019-03-09 DIAGNOSIS — E119 Type 2 diabetes mellitus without complications: Secondary | ICD-10-CM | POA: Diagnosis not present

## 2019-03-09 LAB — CBC WITH DIFFERENTIAL/PLATELET
Abs Immature Granulocytes: 0.02 10*3/uL (ref 0.00–0.07)
Basophils Absolute: 0 10*3/uL (ref 0.0–0.1)
Basophils Relative: 1 %
Eosinophils Absolute: 0.1 10*3/uL (ref 0.0–0.5)
Eosinophils Relative: 1 %
HCT: 40.7 % (ref 39.0–52.0)
Hemoglobin: 12.9 g/dL — ABNORMAL LOW (ref 13.0–17.0)
Immature Granulocytes: 0 %
Lymphocytes Relative: 53 %
Lymphs Abs: 2.4 10*3/uL (ref 0.7–4.0)
MCH: 28.3 pg (ref 26.0–34.0)
MCHC: 31.7 g/dL (ref 30.0–36.0)
MCV: 89.3 fL (ref 80.0–100.0)
Monocytes Absolute: 0.5 10*3/uL (ref 0.1–1.0)
Monocytes Relative: 10 %
Neutro Abs: 1.6 10*3/uL — ABNORMAL LOW (ref 1.7–7.7)
Neutrophils Relative %: 35 %
Platelets: 159 10*3/uL (ref 150–400)
RBC: 4.56 MIL/uL (ref 4.22–5.81)
RDW: 16.1 % — ABNORMAL HIGH (ref 11.5–15.5)
WBC: 4.5 10*3/uL (ref 4.0–10.5)
nRBC: 0 % (ref 0.0–0.2)

## 2019-03-09 LAB — COMPREHENSIVE METABOLIC PANEL
ALT: 21 U/L (ref 0–44)
AST: 44 U/L — ABNORMAL HIGH (ref 15–41)
Albumin: 3.8 g/dL (ref 3.5–5.0)
Alkaline Phosphatase: 57 U/L (ref 38–126)
Anion gap: 8 (ref 5–15)
BUN: 14 mg/dL (ref 8–23)
CO2: 27 mmol/L (ref 22–32)
Calcium: 9.4 mg/dL (ref 8.9–10.3)
Chloride: 102 mmol/L (ref 98–111)
Creatinine, Ser: 0.81 mg/dL (ref 0.61–1.24)
GFR calc Af Amer: >60 mL/min (ref >60)
GFR calc non Af Amer: >60 mL/min (ref >60)
Glucose, Bld: 139 mg/dL — ABNORMAL HIGH (ref 70–99)
Potassium: 4 mmol/L (ref 3.5–5.1)
Sodium: 137 mmol/L (ref 135–145)
Total Bilirubin: 0.6 mg/dL (ref 0.3–1.2)
Total Protein: 7.5 g/dL (ref 6.5–8.1)

## 2019-03-09 LAB — TROPONIN I (HIGH SENSITIVITY)
Troponin I (High Sensitivity): 18 ng/L — ABNORMAL HIGH (ref <18)
Troponin I (High Sensitivity): 18 ng/L — ABNORMAL HIGH (ref <18)

## 2019-03-09 LAB — LIPASE, BLOOD: Lipase: 38 U/L (ref 11–51)

## 2019-03-09 LAB — BRAIN NATRIURETIC PEPTIDE: B Natriuretic Peptide: 250.5 pg/mL — ABNORMAL HIGH (ref 0.0–100.0)

## 2019-03-09 MED ORDER — FUROSEMIDE 10 MG/ML IJ SOLN
60.0000 mg | Freq: Once | INTRAMUSCULAR | Status: AC
  Administered 2019-03-09: 60 mg via INTRAVENOUS
  Filled 2019-03-09: qty 6

## 2019-03-09 NOTE — ED Notes (Signed)
ED Provider at bedside. 

## 2019-03-09 NOTE — ED Provider Notes (Signed)
[facility] EMERGENCY DEPARTMENT Provider Note   CSN: [id number] Arrival date & time: 03/09/19  0707     History   Chief Complaint Chief Complaint  Patient presents with  . Shortness of Breath    HPI Eugine Godman is a 83 y.o. male.     83 yo M with a chief complaints of shortness of breath.  He has had worsening orthopnea over the past few days.  Has a history of heart failure, feels like he has been retaining fluid in his abdomen more than his legs.  Has felt a bit of a tightness there.  Is actually worse on Sunday and improved a little bit over the past couple days.  He still feels bad and called his cardiologist who suggested he come to the ED for evaluation.  He has had a mild cough though this seems to be a chronic issue for him.  No fevers.  No sick contacts.  Has shortness of breath on exertion but that is also an issue that he has had since January or February of this year.  Not significantly worsening.  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  Has had good response but worsening fluid retention.   The history is provided by the patient.  Shortness of Breath Severity:  Moderate Onset quality:  Gradual Duration:  1 week Timing:  Constant Progression:  Worsening Chronicity:  New Relieved by:  Lying down Worsened by:  Nothing Ineffective treatments:  None tried Associated symptoms: no abdominal pain, no chest pain, no fever, no headaches, no rash and no vomiting     Past Medical History:  Diagnosis Date  . Anemia 06/04/2015  . Anxiety   . Atrial flutter ([place])    s/p ablation 07/17/08; echo EF 60-65% trivival AI  . Bradycardia   . Chicken pox as a child  . Complication of anesthesia    "during ablation-heard people talking and he was groaning"  . Coronary artery disease    "slight"  . Diabetes mellitus type 2 in obese [facility]) 04/10/2009   Qualifier: Diagnosis of  By: Arnoldo Morale MD, Balinda Quails   . Dysrhythmia    a-fib.   . Emphysema of lung  ([place])   . GERD (gastroesophageal reflux disease)   . Glucose intolerance (impaired glucose tolerance)   . H/O hiatal hernia   . Heart murmur    "slight"  . Hepatitis    h/o of type A  . Hypertension   . Hypothyroidism   . Measles as a child  . Micturition syncope   . Mumps as a child  . Osteoarthritis   . Preventative health care 12/10/2015  . Sleep apnea   . Vitamin D deficiency 11/24/2015    Patient Active Problem List   Diagnosis Date Noted  . Medication side effect 05/19/2018  . Preventative health care 12/10/2015  . Vitamin D deficiency 11/24/2015  . Anemia 06/04/2015  . Heart murmur 10/18/2014  . Measles   . Mumps   . Postoperative anemia due to acute blood loss 08/03/2013  . OA (osteoarthritis) of knee 08/01/2013  . Allergic rhinitis 07/14/2013  . Biceps rupture, distal 09/16/2012  . Carotid bruit 01/06/2012  . Coronary atherosclerosis of native coronary artery 08/31/2011  . Atrial fibrillation ([place]) 06/25/2011  . Diabetes mellitus type 2 in obese ([place]) 04/10/2009  . Bradycardia 03/13/2009  . ATRIAL FLUTTER 11/22/2008  . Obstructive sleep apnea 08/29/2008  . Hypothyroidism 09/23/2006  . Hyperlipidemia 09/23/2006  . Depression  with anxiety 09/23/2006  . Essential hypertension 09/23/2006  . GERD 09/23/2006    Past Surgical History:  Procedure Laterality Date  . CARDIAC CATHETERIZATION    . CARDIAC ELECTROPHYSIOLOGY MAPPING AND ABLATION  06/2008  . CATARACT EXTRACTION W/ INTRAOCULAR LENS  IMPLANT, BILATERAL  ~ 2007  . COLONOSCOPY    . FOOT TENDON SURGERY Left   . TONSILLECTOMY  1940's  . TOTAL KNEE ARTHROPLASTY  1990's   right  . TOTAL KNEE ARTHROPLASTY Left 08/02/2013   Procedure: LEFT TOTAL KNEE ARTHROPLASTY;  Surgeon: Gearlean Alf, MD;  Location: [facility];  Service: Orthopedics;  Laterality: Left;  . WISDOM TOOTH EXTRACTION          Home Medications    Prior to Admission medications   Medication Sig Start Date End Date Taking? Authorizing Provider   dasatinib (SPRYCEL) 100 MG tablet Take 100 mg by mouth daily.   Yes [provider]  amLODipine (NORVASC) 10 MG tablet Take 10 mg by mouth daily.    [provider]  apixaban (ELIQUIS) 5 MG TABS tablet Eliquis 5 mg tablet    [provider]  Biotin 10000 MCG TABS Take by mouth.    [provider]  CINNAMON PO Take by mouth.    [provider]  cloNIDine (CATAPRES) 0.1 MG tablet Take 0.1 mg by mouth daily.    [provider]  Coenzyme Q10 (COQ10 PO) Take by mouth.    [provider]  docusate sodium (COLACE) 100 MG capsule Take 100 mg by mouth 2 (two) times daily.    [provider]  ELIQUIS 5 MG TABS tablet TAKE 1 TABLET BY MOUTH TWICE A DAY 01/01/16   Bensimhon, Shaune Pascal, MD  fenofibrate (TRICOR) 145 MG tablet TAKE 1 TABLET BY MOUTH EVERY DAY 12/09/18   Bensimhon, Shaune Pascal, MD  finasteride (PROSCAR) 5 MG tablet Take 5 mg by mouth daily.     [provider]  finasteride (PROSCAR) 5 MG tablet finasteride 5 mg tablet  Take 1 tablet every day by oral route.    [provider]  furosemide (LASIX) 40 MG tablet Take 1 tablet (40 mg total) by mouth daily. 01/27/19   Bensimhon, Shaune Pascal, MD  KLOR-CON M20 20 MEQ tablet TAKE 1 TABLET (20 MEQ TOTAL) BY MOUTH DAILY. PLEASE CALL FOR OFFICE VISIT [phone] 02/22/19   Bensimhon, Shaune Pascal, MD  levothyroxine (SYNTHROID, LEVOTHROID) 175 MCG tablet TAKE 1 TABLET (175 MCG TOTAL) BY MOUTH DAILY BEFORE BREAKFAST. 11/07/16   Mosie Lukes, MD  Melatonin 10 MG TABS Take by mouth.    [provider]  Multiple Vitamins-Minerals (CENTRUM ADULTS PO) Take by mouth.    [provider]  omeprazole (PRILOSEC) 20 MG capsule Take 20 mg by mouth daily.     [provider]  PARoxetine (PAXIL) 40 MG tablet Take 1 tablet (40 mg total) by mouth every morning. 09/14/13   Ricard Dillon, MD  PARoxetine (PAXIL) 40 MG tablet paroxetine 40 mg tablet  Take 1 tablet every  day by oral route.    [provider]  potassium chloride SA (KLOR-CON M20) 20 MEQ tablet Klor-Con M20 mEq tablet,extended release    [provider]  rosuvastatin (CRESTOR) 10 MG tablet Take 1 tablet (10 mg total) by mouth daily. 11/13/18 02/11/19  Bensimhon, Shaune Pascal, MD  spironolactone (ALDACTONE) 25 MG tablet Take 0.5 tablets (12.5 mg total) by mouth daily. 07/21/17   Bensimhon, Shaune Pascal, MD  Turmeric 1053 MG  TABS Take by mouth.    [provider]    Family History Family History  Problem Relation Age of Onset  . Multiple sclerosis Father   . Heart disease Mother        atrial fibrillation  . Hypertension Son   . Heart disease Maternal Grandfather     Social History Social History   Tobacco Use  . Smoking status: Former Smoker    Years: 0.00    Types: Pipe, Cigars    Quit date: 04/01/1996    Years since quitting: 22.9  . Smokeless tobacco: Former Systems developer    Types: Chew    Quit date: 04/01/2009  Substance Use Topics  . Alcohol use: Not Currently    Alcohol/week: 0.0 standard drinks    Comment: none in a year  . Drug use: No     Allergies   Codeine, Statins, Ace inhibitors, Benazepril, Piroxicam, and Pseudoephedrine   Review of Systems Review of Systems  Constitutional: Negative for chills and fever.  HENT: Negative for congestion and facial swelling.   Eyes: Negative for discharge and visual disturbance.  Respiratory: Positive for shortness of breath.   Cardiovascular: Negative for chest pain and palpitations.  Gastrointestinal: Positive for abdominal distention. Negative for abdominal pain, diarrhea and vomiting.  Musculoskeletal: Negative for arthralgias and myalgias.  Skin: Negative for color change and rash.  Neurological: Negative for tremors, syncope and headaches.  Psychiatric/Behavioral: Negative for confusion and dysphoric mood.     Physical Exam Updated Vital Signs BP (!) 121/91   Pulse 80   Resp 18   Ht 6\' 2"  (1.88 m)   Wt  106.1 kg   SpO2 93%   BMI 30.04 kg/m   Physical Exam Vitals signs and nursing note reviewed.  Constitutional:      Appearance: He is well-developed.  HENT:     Head: Normocephalic and atraumatic.  Eyes:     Pupils: Pupils are equal, round, and reactive to light.  Neck:     Musculoskeletal: Normal range of motion and neck supple.     Vascular: JVD (just above the clavicles) present.  Cardiovascular:     Rate and Rhythm: Normal rate. Rhythm irregularly irregular.     Heart sounds: Murmur present. Systolic (aortic pole) murmur present with a grade of 5/6. No friction rub. No gallop.   Pulmonary:     Effort: No respiratory distress.     Breath sounds: Rales (bases) present. No wheezing.  Abdominal:     General: There is no distension.     Tenderness: There is no abdominal tenderness. There is no guarding or rebound.  Musculoskeletal: Normal range of motion.     Right lower leg: Edema present.     Left lower leg: Edema present.     Comments: 1+ to BLE  Skin:    Coloration: Skin is not pale.     Findings: No rash.  Neurological:     Mental Status: He is alert and oriented to person, place, and time.  Psychiatric:        Behavior: Behavior normal.      ED Treatments / Results  Labs (all labs ordered are listed, but only abnormal results are displayed) Labs Reviewed  CBC WITH DIFFERENTIAL/PLATELET - Abnormal; Notable for the following components:      Result Value   Hemoglobin 12.9 (*)    RDW 16.1 (*)    Neutro Abs 1.6 (*)    All other components within normal limits  BRAIN NATRIURETIC  PEPTIDE - Abnormal; Notable for the following components:   B Natriuretic Peptide 250.5 (*)    All other components within normal limits  COMPREHENSIVE METABOLIC PANEL - Abnormal; Notable for the following components:   Glucose, Bld 139 (*)    AST 44 (*)    All other components within normal limits  TROPONIN I (HIGH SENSITIVITY) - Abnormal; Notable for the following components:    Troponin I (High Sensitivity) 18 (*)    All other components within normal limits  TROPONIN I (HIGH SENSITIVITY) - Abnormal; Notable for the following components:   Troponin I (High Sensitivity) 18 (*)    All other components within normal limits  LIPASE, BLOOD    EKG EKG Interpretation  Date/Time:  Tuesday March 09 2019 07:23:44 EST Ventricular Rate:  90 PR Interval:    QRS Duration: 110 QT Interval:  359 QTC Calculation: 440 R Axis:   -43 Text Interpretation: Atrial fibrillation Incomplete left bundle branch block Low voltage, precordial leads No significant change since last tracing Confirmed by Deno Etienne [phone]) on 03/09/2019 7:53:14 AM   Radiology Dg Chest 2 View  Result Date: 03/09/2019 CLINICAL DATA:  Shortness of breath. EXAM: CHEST - 2 VIEW COMPARISON:  July 26, 2013 FINDINGS: Low lung volumes are identified. Small bilateral pleural effusions with underlying opacities, probably atelectasis, are identified. The cardiomediastinal silhouette is stable. No overt edema. No other abnormalities. IMPRESSION: Small bilateral pleural effusions with underlying opacities, likely atelectasis. No overt edema or other abnormality. Electronically Signed   By: Dorise Bullion III M.D   On: 03/09/2019 08:48    Procedures Procedures (including critical care time)  Medications Ordered in ED Medications  furosemide (LASIX) injection 60 mg (60 mg Intravenous Given 03/09/19 0820)     Initial Impression / Assessment and Plan / ED Course  I have reviewed the triage vital signs and the nursing notes.  Pertinent labs & imaging results that were available during my care of the patient were reviewed by me and considered in my medical decision making (see chart for details).        83 yo M with a chief complaints of shortness of breath mostly upon laying flat.  Patient has a history of heart failure and has increased his Lasix recently at the direction of his cardiologist.  Patient feels  that despite that he has gotten mildly worse.  He has appointment with his cardiologist tomorrow and he just wants to make it to that appointment.  Has been having shortness of breath on exertion but has not significantly changed over the past 8 or 9 months.  On my exam he clinically appears to be mildly fluid overloaded with JVD just above the clavicles and diminished breath sounds at the bases.  Will obtain labs, cxr, give lasix once K and renal function has been evaluated.  Patient would like to go home, has follow up tomorrow with cards.   CXR with moderate pleural effusions bilaterally as viewed by me, no recent cxr to compare.   Patient delta troponin is stable.  Unlikely to be an MI.  Patient's had about a liter and a half of urine output.  He is feeling much better.  He has cards follow-up tomorrow.  Discharge home.  12:33 PM:  I have discussed the diagnosis/risks/treatment options with the patient and family and believe the pt to be eligible for discharge home to follow-up with Cards. We also discussed returning to the ED immediately if new or worsening sx occur. We  discussed the sx which are most concerning (e.g., sudden worsening sob, fever, inability to tolerate by mouth) that necessitate immediate return. Medications administered to the patient during their visit and any new prescriptions provided to the patient are listed below.  Medications given during this visit Medications  furosemide (LASIX) injection 60 mg (60 mg Intravenous Given 03/09/19 0820)     The patient appears reasonably screen and/or stabilized for discharge and I doubt any other medical condition or other [facility] requiring further screening, evaluation, or treatment in the ED at this time prior to discharge.   Final Clinical Impressions(s) / ED Diagnoses   Final diagnoses:  Acute on chronic diastolic congestive heart failure [facility])    ED Discharge Orders    None       Deno Etienne, DO 03/09/19 1233

## 2019-03-09 NOTE — ED Triage Notes (Signed)
Pt arrives with wife who reports SOB increasing last night. Pt states that he is in fluid overload r/t a leukemia medication.

## 2019-03-09 NOTE — ED Notes (Signed)
Pt on monitor 

## 2019-03-09 NOTE — Discharge Instructions (Signed)
Return for worsening shortness of breath.  Follow up with your cardiologist in the office

## 2019-03-10 ENCOUNTER — Ambulatory Visit (HOSPITAL_COMMUNITY)
Admission: RE | Admit: 2019-03-10 | Discharge: 2019-03-10 | Disposition: A | Discharge status: Home / Self Care | Payer: Medicare Other | Source: Ambulatory Visit | Attending: Internal Medicine | Admitting: Internal Medicine

## 2019-03-10 ENCOUNTER — Encounter (HOSPITAL_COMMUNITY): Payer: Self-pay | Admitting: Internal Medicine

## 2019-03-10 VITALS — BP 132/83 | HR 102 | Wt 241.4 lb

## 2019-03-10 DIAGNOSIS — I2510 Atherosclerotic heart disease of native coronary artery without angina pectoris: Secondary | ICD-10-CM | POA: Insufficient documentation

## 2019-03-10 DIAGNOSIS — I110 Hypertensive heart disease with heart failure: Secondary | ICD-10-CM | POA: Insufficient documentation

## 2019-03-10 DIAGNOSIS — I447 Left bundle-branch block, unspecified: Secondary | ICD-10-CM | POA: Insufficient documentation

## 2019-03-10 DIAGNOSIS — Z79890 Hormone replacement therapy (postmenopausal): Secondary | ICD-10-CM | POA: Diagnosis not present

## 2019-03-10 DIAGNOSIS — I4819 Other persistent atrial fibrillation: Secondary | ICD-10-CM | POA: Diagnosis not present

## 2019-03-10 DIAGNOSIS — E039 Hypothyroidism, unspecified: Secondary | ICD-10-CM | POA: Diagnosis not present

## 2019-03-10 DIAGNOSIS — J439 Emphysema, unspecified: Secondary | ICD-10-CM | POA: Insufficient documentation

## 2019-03-10 DIAGNOSIS — I5033 Acute on chronic diastolic (congestive) heart failure: Secondary | ICD-10-CM | POA: Insufficient documentation

## 2019-03-10 DIAGNOSIS — F419 Anxiety disorder, unspecified: Secondary | ICD-10-CM | POA: Insufficient documentation

## 2019-03-10 DIAGNOSIS — I5022 Chronic systolic (congestive) heart failure: Secondary | ICD-10-CM

## 2019-03-10 DIAGNOSIS — K219 Gastro-esophageal reflux disease without esophagitis: Secondary | ICD-10-CM | POA: Diagnosis not present

## 2019-03-10 DIAGNOSIS — I4892 Unspecified atrial flutter: Secondary | ICD-10-CM | POA: Diagnosis not present

## 2019-03-10 DIAGNOSIS — Z79899 Other long term (current) drug therapy: Secondary | ICD-10-CM | POA: Diagnosis not present

## 2019-03-10 DIAGNOSIS — E119 Type 2 diabetes mellitus without complications: Secondary | ICD-10-CM | POA: Diagnosis not present

## 2019-03-10 DIAGNOSIS — G4733 Obstructive sleep apnea (adult) (pediatric): Secondary | ICD-10-CM | POA: Insufficient documentation

## 2019-03-10 DIAGNOSIS — I5031 Acute diastolic (congestive) heart failure: Secondary | ICD-10-CM

## 2019-03-10 DIAGNOSIS — I6523 Occlusion and stenosis of bilateral carotid arteries: Secondary | ICD-10-CM | POA: Insufficient documentation

## 2019-03-10 DIAGNOSIS — I350 Nonrheumatic aortic (valve) stenosis: Secondary | ICD-10-CM | POA: Diagnosis not present

## 2019-03-10 DIAGNOSIS — C9210 Chronic myeloid leukemia, BCR/ABL-positive, not having achieved remission: Secondary | ICD-10-CM | POA: Insufficient documentation

## 2019-03-10 DIAGNOSIS — I4821 Permanent atrial fibrillation: Secondary | ICD-10-CM | POA: Insufficient documentation

## 2019-03-10 DIAGNOSIS — Z7901 Long term (current) use of anticoagulants: Secondary | ICD-10-CM | POA: Diagnosis not present

## 2019-03-10 DIAGNOSIS — E669 Obesity, unspecified: Secondary | ICD-10-CM | POA: Diagnosis not present

## 2019-03-10 LAB — BASIC METABOLIC PANEL
Anion gap: 13 (ref 5–15)
BUN: 17 mg/dL (ref 8–23)
CO2: 22 mmol/L (ref 22–32)
Calcium: 9.1 mg/dL (ref 8.9–10.3)
Chloride: 103 mmol/L (ref 98–111)
Creatinine, Ser: 0.96 mg/dL (ref 0.61–1.24)
GFR calc Af Amer: >60 mL/min (ref >60)
GFR calc non Af Amer: >60 mL/min (ref >60)
Glucose, Bld: 121 mg/dL — ABNORMAL HIGH (ref 70–99)
Potassium: 3.5 mmol/L (ref 3.5–5.1)
Sodium: 138 mmol/L (ref 135–145)

## 2019-03-10 LAB — BRAIN NATRIURETIC PEPTIDE: B Natriuretic Peptide: 218.9 pg/mL — ABNORMAL HIGH (ref 0.0–100.0)

## 2019-03-10 MED ORDER — TORSEMIDE 20 MG PO TABS: 40.0000 mg | ORAL_TABLET | Freq: Every day | ORAL | 3 refills | Status: DC

## 2019-03-10 MED ORDER — METOLAZONE 2.5 MG PO TABS: ORAL_TABLET | ORAL | 0 refills | Status: DC

## 2019-03-10 NOTE — Progress Notes (Signed)
ReDS Vest / Clip - 03/10/19 1400      ReDS Vest / Clip   Station Marker  D    Ruler Value  32    ReDS Value Range  (!) High volume overload    ReDS Actual Value  42    Anatomical Comments  sitting

## 2019-03-10 NOTE — Progress Notes (Signed)
CARDIOLOGY CLINIC NOTE  Patient ID: [name] White, male   DOB: 04-28-1935, 83 y.o.   MRN: [id number] PCP: Benay Pillow Also sees the [place] in [place]  HPI: [name] White is a delightful 83 year old male with a history of non-obstructive CAD, micturition syncope, hypertension, hyperlipidemia,borderline diabetes, obesity, sleep apnea, atrial flutter s/p a. flutter ablation by Dr. Lovena Le 06/2008. Developed PAF in 2013 which was quite symptomatic and placed on Tikosyn and Eliquis. Eventually developed persistent AF.   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 Being treated at [facility]. Has lost over 40 pounds. BP down so clonidine stopped.    Echo 10/20 EF 55-60% RV ok. Severe AS mean gradient 77mmHG Small effusion. Mild to mod MR/TR  Was seen at [facility] yesterday for SOB. Says he has been SOB and bloated for week. Also having orthopnea and PND. Found to be mildly fluid overloaded. BNP 250. CXR. Small effusions. No edema..  Given 60 IV lasix and had great response. Has been taking lasix 60 mg at home MWF without any benefit. Remains on chemo for CML.     Cardiac studies:  Echo 5/19 EF 55-60% moderate AS mean gradient 22  Cardiac CT in 07/2011 Left Main: No plaque or stenosis.  Left Anterior Descending: Prominent mixed plaque in the proximal  LAD with probably moderate (50-70%) stenosis. The first diagonal  was a small vessel with calcified plaque at the ostium (could be  significant stenosis but cannot say for sure due to blooming  artifact from the calcium).  Left Circumflex: Mixed plaque in the proximal LCx with mild  stenosis.  Right Coronary Artery: Mixed plaque in the mid RCA with mild  stenosis. Dominant vessel.  Coronary Calcium Score: 355 Agatston units    Lab Results  Component Value Date   CHOL 171 01/22/2018   HDL 57 01/22/2018   LDLCALC 94 01/22/2018   LDLDIRECT 141.6 11/12/2010   TRIG 98 01/22/2018   CHOLHDL 3.0 01/22/2018   ROS: All systems negative except as listed in HPI, PMH  and Problem List.  Past Medical History:  Diagnosis Date  . Anemia 06/04/2015  . Anxiety   . Atrial flutter ([place])    s/p ablation 07/17/08; echo EF 60-65% trivival AI  . Bradycardia   . Chicken pox as a child  . Complication of anesthesia    "during ablation-heard people talking and he was groaning"  . Coronary artery disease    "slight"  . Diabetes mellitus type 2 in obese [facility]) 04/10/2009   Qualifier: Diagnosis of  By: Arnoldo Morale MD, Balinda Quails   . Dysrhythmia    a-fib.   . Emphysema of lung ([place])   . GERD (gastroesophageal reflux disease)   . Glucose intolerance (impaired glucose tolerance)   . H/O hiatal hernia   . Heart murmur    "slight"  . Hepatitis    h/o of type A  . Hypertension   . Hypothyroidism   . Measles as a child  . Micturition syncope   . Mumps as a child  . Osteoarthritis   . Preventative health care 12/10/2015  . Sleep apnea   . Vitamin D deficiency 11/24/2015    Current Outpatient Medications  Medication Sig Dispense Refill  . amLODipine (NORVASC) 10 MG tablet Take 10 mg by mouth daily.    [name] apixaban (ELIQUIS) 5 MG TABS tablet Eliquis 5 mg tablet    . Biotin 10000 MCG TABS Take by mouth.    [name] CINNAMON PO Take by mouth.    [name]  Coenzyme Q10 (COQ10 PO) Take by mouth.    . dasatinib (SPRYCEL) 100 MG tablet Take 100 mg by mouth daily.    [name] docusate sodium (COLACE) 100 MG capsule Take 100 mg by mouth 2 (two) times daily.    [name] ELIQUIS 5 MG TABS tablet TAKE 1 TABLET BY MOUTH TWICE A DAY 180 tablet 3  . fenofibrate (TRICOR) 145 MG tablet TAKE 1 TABLET BY MOUTH EVERY DAY 90 tablet 1  . finasteride (PROSCAR) 5 MG tablet Take 5 mg by mouth daily.     . finasteride (PROSCAR) 5 MG tablet finasteride 5 mg tablet  Take 1 tablet every day by oral route.    . furosemide (LASIX) 40 MG tablet Take 1 tablet (40 mg total) by mouth daily. 90 tablet 3  . KLOR-CON M20 20 MEQ tablet TAKE 1 TABLET (20 MEQ TOTAL) BY MOUTH DAILY. PLEASE CALL FOR OFFICE VISIT [phone] 90 tablet 1  .  levothyroxine (SYNTHROID, LEVOTHROID) 175 MCG tablet TAKE 1 TABLET (175 MCG TOTAL) BY MOUTH DAILY BEFORE BREAKFAST. 90 tablet 1  . Melatonin 10 MG TABS Take by mouth.    . Multiple Vitamins-Minerals (CENTRUM ADULTS PO) Take by mouth.    [name] omeprazole (PRILOSEC) 20 MG capsule Take 20 mg by mouth daily.     [name] PARoxetine (PAXIL) 40 MG tablet Take 1 tablet (40 mg total) by mouth every morning. 90 tablet 1  . PARoxetine (PAXIL) 40 MG tablet paroxetine 40 mg tablet  Take 1 tablet every day by oral route.    . potassium chloride SA (KLOR-CON M20) 20 MEQ tablet Klor-Con M20 mEq tablet,extended release    . rosuvastatin (CRESTOR) 10 MG tablet Take 1 tablet (10 mg total) by mouth daily. 90 tablet 3  . spironolactone (ALDACTONE) 25 MG tablet Take 0.5 tablets (12.5 mg total) by mouth daily. 45 tablet 3  . Turmeric 1053 MG TABS Take by mouth.    . cloNIDine (CATAPRES) 0.1 MG tablet Take 0.1 mg by mouth daily.     No current facility-administered medications for this encounter.     Vitals:   03/10/19 1331  BP: 132/83  Pulse: (!) 102  SpO2: 95%  Weight: 109.5 kg (241 lb 6.4 oz)    PHYSICAL EXAM: General:  SOB with minimal exertion HEENT: normal Neck: supple. JVP 8-9  Carotids 2+ bilat; + bruits. No lymphadenopathy or thryomegaly appreciated. Cor: PMI nondisplaced. Irregular rate & rhythm. 3/6 AS s2 moderately diminished Lungs: crackles at bases Abdomen: soft, nontender, nondistended. No hepatosplenomegaly. No bruits or masses. Good bowel sounds. Extremities: no cyanosis, clubbing, rash, traceedema Neuro: alert & orientedx3, cranial nerves grossly intact. moves all 4 extremities w/o difficulty. Affect pleasant  EKG:  AF 76 Personally reviewed  ASSESSMENT & PLAN:  1) Acute on chronic diastolic HF in setting of severe AS - has significant volume overload. REDs 42% - NYHA IIIB-IV - Lasix 60 daily not working - Will start torsemide 40 daily and give one dose metolazone. If not improving will  admit for IV diuresis - Concern for cardiac amyloidosis   2) Severe AS - marked progression of AS on echo in 10/20. Mean gradient now 85mmHG - will need TAVR. D/w Structural Team today - Plan R;/L cath next week with f/u TAVR Clinic  3) CAD - No currents/s ischemia. Last cardiac CT 2013 with 50-70% LAD - Will need R/L Cath next week in preparation for TAVR - Continue Crestor 10. Not on ASA due to Eliquis. Not on BB with bradycardia  4) CML - Following with [facility] Don [facility] at [facility] - On dasatinib  5) Atrial fibrillation, permanent - Tikosyn stopped 5/17 due to persistent AF. - Remains in AF today. Rate controlled. - Continue Eliquis 5 mg BID. No bleeding  4) HTN - BP stable  5) Carotid Stenosis - Carotid duplex 12/2013; stable 0-39% bilateral ICA stenosis - Continue Crestor - F/u as needed  6) OSA - compliant with CPAP - may not need it any more with 40 pound weight loss   Total time spent 35 minutes. Over half that time spent discussing above.    Glori Bickers, MD 1:58 PM `

## 2019-03-10 NOTE — Patient Instructions (Signed)
STOP lasix  START Torsemide 40mg  daily  Take Potassium 73meq daily  Take Metolazone 2.5mg  today with extra 86meq of potassium  Follow up in 1 week

## 2019-03-11 ENCOUNTER — Telehealth (HOSPITAL_COMMUNITY): Payer: Self-pay | Admitting: *Deleted

## 2019-03-11 NOTE — Telephone Encounter (Signed)
Pt called to report he had an output of 164 oz last night and he is feeling much better.  He states he can even recline back in chair without any difficulty breathing.  He will continue the Torsemide 40 mg daily and wt himself daily.  If wt increasing or sob returning before appt 12/15 he will call us back.

## 2019-03-16 ENCOUNTER — Other Ambulatory Visit (HOSPITAL_COMMUNITY): Payer: Self-pay | Admitting: *Deleted

## 2019-03-16 ENCOUNTER — Ambulatory Visit (HOSPITAL_COMMUNITY)
Admission: RE | Admit: 2019-03-16 | Discharge: 2019-03-16 | Disposition: A | Discharge status: Home / Self Care | Payer: Medicare Other | Source: Ambulatory Visit | Attending: Internal Medicine | Admitting: Internal Medicine

## 2019-03-16 ENCOUNTER — Other Ambulatory Visit (HOSPITAL_COMMUNITY)
Admission: RE | Admit: 2019-03-16 | Discharge: 2019-03-16 | Disposition: A | Discharge status: Home / Self Care | Payer: Medicare Other | Source: Ambulatory Visit | Attending: Internal Medicine | Admitting: Internal Medicine

## 2019-03-16 ENCOUNTER — Other Ambulatory Visit: Payer: Self-pay

## 2019-03-16 ENCOUNTER — Encounter (HOSPITAL_COMMUNITY): Payer: Self-pay | Admitting: Internal Medicine

## 2019-03-16 VITALS — BP 115/57 | HR 101 | Wt 223.6 lb

## 2019-03-16 DIAGNOSIS — Z20828 Contact with and (suspected) exposure to other viral communicable diseases: Secondary | ICD-10-CM | POA: Insufficient documentation

## 2019-03-16 DIAGNOSIS — I5033 Acute on chronic diastolic (congestive) heart failure: Secondary | ICD-10-CM | POA: Insufficient documentation

## 2019-03-16 DIAGNOSIS — C9210 Chronic myeloid leukemia, BCR/ABL-positive, not having achieved remission: Secondary | ICD-10-CM | POA: Diagnosis not present

## 2019-03-16 DIAGNOSIS — E785 Hyperlipidemia, unspecified: Secondary | ICD-10-CM | POA: Insufficient documentation

## 2019-03-16 DIAGNOSIS — E669 Obesity, unspecified: Secondary | ICD-10-CM | POA: Diagnosis not present

## 2019-03-16 DIAGNOSIS — I6523 Occlusion and stenosis of bilateral carotid arteries: Secondary | ICD-10-CM | POA: Diagnosis not present

## 2019-03-16 DIAGNOSIS — I4821 Permanent atrial fibrillation: Secondary | ICD-10-CM | POA: Insufficient documentation

## 2019-03-16 DIAGNOSIS — I5032 Chronic diastolic (congestive) heart failure: Secondary | ICD-10-CM

## 2019-03-16 DIAGNOSIS — I350 Nonrheumatic aortic (valve) stenosis: Secondary | ICD-10-CM | POA: Insufficient documentation

## 2019-03-16 DIAGNOSIS — I2510 Atherosclerotic heart disease of native coronary artery without angina pectoris: Secondary | ICD-10-CM | POA: Diagnosis not present

## 2019-03-16 DIAGNOSIS — J439 Emphysema, unspecified: Secondary | ICD-10-CM | POA: Insufficient documentation

## 2019-03-16 DIAGNOSIS — R7303 Prediabetes: Secondary | ICD-10-CM | POA: Diagnosis not present

## 2019-03-16 DIAGNOSIS — I10 Essential (primary) hypertension: Secondary | ICD-10-CM | POA: Insufficient documentation

## 2019-03-16 DIAGNOSIS — Z79890 Hormone replacement therapy (postmenopausal): Secondary | ICD-10-CM | POA: Diagnosis not present

## 2019-03-16 DIAGNOSIS — K219 Gastro-esophageal reflux disease without esophagitis: Secondary | ICD-10-CM | POA: Diagnosis not present

## 2019-03-16 DIAGNOSIS — I480 Paroxysmal atrial fibrillation: Secondary | ICD-10-CM | POA: Diagnosis not present

## 2019-03-16 DIAGNOSIS — Z79899 Other long term (current) drug therapy: Secondary | ICD-10-CM | POA: Insufficient documentation

## 2019-03-16 DIAGNOSIS — Z7901 Long term (current) use of anticoagulants: Secondary | ICD-10-CM | POA: Insufficient documentation

## 2019-03-16 DIAGNOSIS — E039 Hypothyroidism, unspecified: Secondary | ICD-10-CM | POA: Insufficient documentation

## 2019-03-16 DIAGNOSIS — G4733 Obstructive sleep apnea (adult) (pediatric): Secondary | ICD-10-CM | POA: Diagnosis not present

## 2019-03-16 DIAGNOSIS — I110 Hypertensive heart disease with heart failure: Secondary | ICD-10-CM | POA: Diagnosis not present

## 2019-03-16 DIAGNOSIS — M1990 Unspecified osteoarthritis, unspecified site: Secondary | ICD-10-CM | POA: Insufficient documentation

## 2019-03-16 DIAGNOSIS — I5022 Chronic systolic (congestive) heart failure: Secondary | ICD-10-CM

## 2019-03-16 LAB — CBC
HCT: 39.3 % (ref 39.0–52.0)
Hemoglobin: 12.5 g/dL — ABNORMAL LOW (ref 13.0–17.0)
MCH: 28.3 pg (ref 26.0–34.0)
MCHC: 31.8 g/dL (ref 30.0–36.0)
MCV: 88.9 fL (ref 80.0–100.0)
Platelets: 181 10*3/uL (ref 150–400)
RBC: 4.42 MIL/uL (ref 4.22–5.81)
RDW: 15.5 % (ref 11.5–15.5)
WBC: 4.3 10*3/uL (ref 4.0–10.5)
nRBC: 0 % (ref 0.0–0.2)

## 2019-03-16 LAB — BASIC METABOLIC PANEL
Anion gap: 10 (ref 5–15)
BUN: 36 mg/dL — ABNORMAL HIGH (ref 8–23)
CO2: 31 mmol/L (ref 22–32)
Calcium: 9.3 mg/dL (ref 8.9–10.3)
Chloride: 96 mmol/L — ABNORMAL LOW (ref 98–111)
Creatinine, Ser: 1.23 mg/dL (ref 0.61–1.24)
GFR calc Af Amer: >60 mL/min (ref >60)
GFR calc non Af Amer: 55 mL/min — ABNORMAL LOW (ref >60)
Glucose, Bld: 143 mg/dL — ABNORMAL HIGH (ref 70–99)
Potassium: 3.7 mmol/L (ref 3.5–5.1)
Sodium: 137 mmol/L (ref 135–145)

## 2019-03-16 LAB — SARS CORONAVIRUS 2 (TAT 6-24 HRS): SARS Coronavirus 2: NEGATIVE

## 2019-03-16 MED ORDER — SODIUM CHLORIDE 0.9% FLUSH: 3.0000 mL | Freq: Two times a day (BID) | INTRAVENOUS | Status: DC

## 2019-03-16 MED ORDER — TORSEMIDE 20 MG PO TABS: 20.0000 mg | ORAL_TABLET | Freq: Every day | ORAL | 3 refills | Status: DC

## 2019-03-16 NOTE — H&P (View-Only) (Signed)
CARDIOLOGY CLINIC NOTE  Patient ID: [name] White, male   DOB: 1935/09/02, 83 y.o.   MRN: [id number] PCP: [name] White Also sees the [place] in [place]  HPI: [name] White is a delightful 83 year old male with a history of non-obstructive CAD, micturition syncope, hypertension, hyperlipidemia,borderline diabetes, obesity, sleep apnea, atrial flutter s/p a. flutter ablation by Dr. Lovena White 06/2008. Developed PAF in 2013 which was quite symptomatic and placed on Tikosyn and Eliquis. Eventually developed persistent AF.   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 Being treated at [facility]. Has lost over 40 pounds. BP down so clonidine stopped.    Echo 10/20 EF 55-60% RV ok. Severe AS mean gradient 83mmHG Small effusion. Mild to mod MR/TR  Was seen at [facility] on 03/09/19 for SOB. We saw him on 12/9 and he was markedly volume overloaded. We changed lasix 60 daily to torsemide 40 daily and gave him a dose of metolazone. Weight now down 20 pounds over the last week.  Feels great. No SOB, orthopnea or PND. No dizziness.    Cardiac studies:  Echo 5/19 EF 55-60% moderate AS mean gradient 22  Cardiac CT in 07/2011 Left Main: No plaque or stenosis.  Left Anterior Descending: Prominent mixed plaque in the proximal  LAD with probably moderate (50-70%) stenosis. The first diagonal  was a small vessel with calcified plaque at the ostium (could be  significant stenosis but cannot say for sure due to blooming  artifact from the calcium).  Left Circumflex: Mixed plaque in the proximal LCx with mild  stenosis.  Right Coronary Artery: Mixed plaque in the mid RCA with mild  stenosis. Dominant vessel.  Coronary Calcium Score: 355 Agatston units    Lab Results  Component Value Date   CHOL 171 01/22/2018   HDL 57 01/22/2018   LDLCALC 94 01/22/2018   LDLDIRECT 141.6 11/12/2010   TRIG 98 01/22/2018   CHOLHDL 3.0 01/22/2018   ROS: All systems negative except as listed in HPI, PMH and Problem List.  Past Medical History:   Diagnosis Date  . Anemia 06/04/2015  . Anxiety   . Atrial flutter ([place])    s/p ablation 07/17/08; echo EF 60-65% trivival AI  . Bradycardia   . Chicken pox as a child  . Complication of anesthesia    "during ablation-heard people talking and he was groaning"  . Coronary artery disease    "slight"  . Diabetes mellitus type 2 in obese [facility]) 04/10/2009   Qualifier: Diagnosis of  By: [name] Morale MD, [name] White   . Dysrhythmia    a-fib.   . Emphysema of lung ([place])   . GERD (gastroesophageal reflux disease)   . Glucose intolerance (impaired glucose tolerance)   . H/O hiatal hernia   . Heart murmur    "slight"  . Hepatitis    h/o of type A  . Hypertension   . Hypothyroidism   . Measles as a child  . Micturition syncope   . Mumps as a child  . Osteoarthritis   . Preventative health care 12/10/2015  . Sleep apnea   . Vitamin D deficiency 11/24/2015    Current Outpatient Medications  Medication Sig Dispense Refill  . apixaban (ELIQUIS) 5 MG TABS tablet Eliquis 5 mg tablet    . Biotin 10000 MCG TABS Take by mouth.    [name] CINNAMON PO Take by mouth.    . Coenzyme Q10 (COQ10 PO) Take by mouth.    . dasatinib (SPRYCEL) 100 MG tablet Take 100 mg by  mouth daily.    [name] docusate sodium (COLACE) 100 MG capsule Take 100 mg by mouth 2 (two) times daily.    [name] ELIQUIS 5 MG TABS tablet TAKE 1 TABLET BY MOUTH TWICE A DAY 180 tablet 3  . fenofibrate (TRICOR) 145 MG tablet TAKE 1 TABLET BY MOUTH EVERY DAY 90 tablet 1  . finasteride (PROSCAR) 5 MG tablet Take 5 mg by mouth daily.     . finasteride (PROSCAR) 5 MG tablet finasteride 5 mg tablet  Take 1 tablet every day by oral route.    [name] KLOR-CON M20 20 MEQ tablet TAKE 1 TABLET (20 MEQ TOTAL) BY MOUTH DAILY. PLEASE CALL FOR OFFICE VISIT [phone] 90 tablet 1  . levothyroxine (SYNTHROID, LEVOTHROID) 175 MCG tablet TAKE 1 TABLET (175 MCG TOTAL) BY MOUTH DAILY BEFORE BREAKFAST. 90 tablet 1  . Melatonin 10 MG TABS Take by mouth.    . metolazone (ZAROXOLYN) 2.5  MG tablet Take only as directed by CHF clinic 10 tablet 0  . Multiple Vitamins-Minerals (CENTRUM ADULTS PO) Take by mouth.    [name] omeprazole (PRILOSEC) 20 MG capsule Take 20 mg by mouth daily.     [name] PARoxetine (PAXIL) 40 MG tablet Take 1 tablet (40 mg total) by mouth every morning. 90 tablet 1  . potassium chloride SA (KLOR-CON) 20 MEQ tablet Take 40 mEq by mouth daily.    . rosuvastatin (CRESTOR) 10 MG tablet Take 1 tablet (10 mg total) by mouth daily. 90 tablet 3  . spironolactone (ALDACTONE) 25 MG tablet Take 0.5 tablets (12.5 mg total) by mouth daily. 45 tablet 3  . torsemide (DEMADEX) 20 MG tablet Take 2 tablets (40 mg total) by mouth daily. 60 tablet 3  . Turmeric 1053 MG TABS Take by mouth.     No current facility-administered medications for this encounter.    Vitals:   03/16/19 1323  BP: (!) 115/57  Pulse: (!) 101  SpO2: 99%  Weight: 101.4 kg (223 lb 9.6 oz)    PHYSICAL EXAM: General:  Well appearing. No resp difficulty HEENT: normal Neck: supple. no JVD. Carotids 2+ bilat; + bruits. No lymphadenopathy or thryomegaly appreciated. Cor: PMI nondisplaced. Irregular rate & rhythm. 3/6 AS s2 moderately diminished Lungs: clear Abdomen: soft, nontender, nondistended. No hepatosplenomegaly. No bruits or masses. Good bowel sounds. Extremities: no cyanosis, clubbing, rash, edema Neuro: alert & orientedx3, cranial nerves grossly intact. moves all 4 extremities w/o difficulty. Affect pleasant  ASSESSMENT & PLAN:  1) Acute on chronic diastolic HF in setting of severe AS - Volume status much improved with switch to torsemide and 20+ pound diuresis. I worry he may be a bit on the dry side.  - Cut back torsemide to 20 daily. Can take extra 20 if weight > 220 - Check labs today - Concern for cardiac amyloidosis. Will check cMRI in near future  2) Severe AS - marked progression of AS on echo in 10/20. Mean gradient now 33mmHG - will likely need TAVR. Has visit with [name] White on  Friday. - Plan R;/L cath Thursday  3) CAD - No current s/s ischemia. Last cardiac CT 2013 with 50-70% LAD - R/L cath this week. - Continue Crestor 10. Not on ASA due to Eliquis. Not on BB with bradycardia  4) CML - Following with Dr. Don Broach at [facility] - On dasatinib  5) Atrial fibrillation, permanent - Tikosyn stopped 5/17 due to persistent AF. - Remains in AF today. Rate mildly elevated in setting of volume depletion.  Check labs. Cut back torsemide - Continue Eliquis 5 mg BID. No bleeding  4) HTN - BP low now. Likely related to AS but concern for amyloid as well.  - Can start midodrine if progresses  5) Carotid Stenosis - Carotid duplex 12/2013; stable 0-39% bilateral ICA stenosis - Continue Crestor - F/u as needed  6) OSA - compliant with CPAP - may not need it any more with > 50 pound weight loss   Glori Bickers, MD 1:59 PM `

## 2019-03-16 NOTE — Patient Instructions (Signed)
HOLD Torsemide x 3 days DECREASE Torsemide to 20 mg, one tab daily You may take an additional tablet in the PM as needed for weight gain or swelling  Labs today We will only contact you if something comes back abnormal or we need to make some changes. Otherwise no news is good news!    You are scheduled for a Cardiac Catheterization on Thursday, December 17 with Dr. Glori Bickers.  1. Please arrive at the [facility] (Main Entrance A) at [facility]: [address] [place], [place] [address] at 9:30 AM (This time is two hours before your procedure to ensure your preparation). Free valet parking service is available.   Special note: Every effort is made to have your procedure done on time. Please understand that emergencies sometimes delay scheduled procedures.  2. Diet: Do not eat solid foods after midnight.  The patient may have clear liquids until 5am upon the day of the procedure.  3. Labs: Pre procedure labs done 03/16/19 You will need a pre procedure COVID test TODAY 03/16/19, they are open until 3:00pm [facility] [address] This is a drive thru testing site, you will remain in your car. Be sure to get in the line FOR PROCEDURES Once you have swabbed you will need to remain home in quarantine until you return for your procedure.  4. Medication instructions in preparation for your procedure:   Contrast Allergy: No    STOP taking Eliquis on Wednesday December 16,2020. HOLD Torsemide the morning of your procedure     On the morning of your procedure, take your Aspirin and any morning medicines NOT listed above.  You may use sips of water.  5. Plan for one night stay--bring personal belongings. 6. Bring a current list of your medications and current insurance cards. 7. You MUST have a responsible person to drive you home. 8. Someone MUST be with you the first 24 hours after you arrive home or your discharge will be delayed. 9.  Please wear clothes that are easy to get on and off and wear slip-on shoes.  Thank you for allowing [place] to care for you!   -- [facility]

## 2019-03-16 NOTE — Progress Notes (Signed)
CARDIOLOGY CLINIC NOTE  Patient ID: [name] White, male   DOB: Aug 19, 1935, 83 y.o.   MRN: [id number] PCP: Benay Pillow Also sees the [place] in [place]  HPI: [name] White is a delightful 83 year old male with a history of non-obstructive CAD, micturition syncope, hypertension, hyperlipidemia,borderline diabetes, obesity, sleep apnea, atrial flutter s/p a. flutter ablation by Dr. Lovena Le 06/2008. Developed PAF in 2013 which was quite symptomatic and placed on Tikosyn and Eliquis. Eventually developed persistent AF.   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 Being treated at [facility]. Has lost over 40 pounds. BP down so clonidine stopped.    Echo 10/20 EF 55-60% RV ok. Severe AS mean gradient 59mmHG Small effusion. Mild to mod MR/TR  Was seen at [facility] on 03/09/19 for SOB. We saw him on 12/9 and he was markedly volume overloaded. We changed lasix 60 daily to torsemide 40 daily and gave him a dose of metolazone. Weight now down 20 pounds over the last week.  Feels great. No SOB, orthopnea or PND. No dizziness.    Cardiac studies:  Echo 5/19 EF 55-60% moderate AS mean gradient 22  Cardiac CT in 07/2011 Left Main: No plaque or stenosis.  Left Anterior Descending: Prominent mixed plaque in the proximal  LAD with probably moderate (50-70%) stenosis. The first diagonal  was a small vessel with calcified plaque at the ostium (could be  significant stenosis but cannot say for sure due to blooming  artifact from the calcium).  Left Circumflex: Mixed plaque in the proximal LCx with mild  stenosis.  Right Coronary Artery: Mixed plaque in the mid RCA with mild  stenosis. Dominant vessel.  Coronary Calcium Score: 355 Agatston units    Lab Results  Component Value Date   CHOL 171 01/22/2018   HDL 57 01/22/2018   LDLCALC 94 01/22/2018   LDLDIRECT 141.6 11/12/2010   TRIG 98 01/22/2018   CHOLHDL 3.0 01/22/2018   ROS: All systems negative except as listed in HPI, PMH and Problem List.  Past Medical History:   Diagnosis Date  . Anemia 06/04/2015  . Anxiety   . Atrial flutter ([place])    s/p ablation 07/17/08; echo EF 60-65% trivival AI  . Bradycardia   . Chicken pox as a child  . Complication of anesthesia    "during ablation-heard people talking and he was groaning"  . Coronary artery disease    "slight"  . Diabetes mellitus type 2 in obese [facility]) 04/10/2009   Qualifier: Diagnosis of  By: Arnoldo Morale MD, Balinda Quails   . Dysrhythmia    a-fib.   . Emphysema of lung ([place])   . GERD (gastroesophageal reflux disease)   . Glucose intolerance (impaired glucose tolerance)   . H/O hiatal hernia   . Heart murmur    "slight"  . Hepatitis    h/o of type A  . Hypertension   . Hypothyroidism   . Measles as a child  . Micturition syncope   . Mumps as a child  . Osteoarthritis   . Preventative health care 12/10/2015  . Sleep apnea   . Vitamin D deficiency 11/24/2015    Current Outpatient Medications  Medication Sig Dispense Refill  . apixaban (ELIQUIS) 5 MG TABS tablet Eliquis 5 mg tablet    . Biotin 10000 MCG TABS Take by mouth.    [name] CINNAMON PO Take by mouth.    . Coenzyme Q10 (COQ10 PO) Take by mouth.    . dasatinib (SPRYCEL) 100 MG tablet Take 100 mg by  mouth daily.    [name] docusate sodium (COLACE) 100 MG capsule Take 100 mg by mouth 2 (two) times daily.    [name] ELIQUIS 5 MG TABS tablet TAKE 1 TABLET BY MOUTH TWICE A DAY 180 tablet 3  . fenofibrate (TRICOR) 145 MG tablet TAKE 1 TABLET BY MOUTH EVERY DAY 90 tablet 1  . finasteride (PROSCAR) 5 MG tablet Take 5 mg by mouth daily.     . finasteride (PROSCAR) 5 MG tablet finasteride 5 mg tablet  Take 1 tablet every day by oral route.    [name] KLOR-CON M20 20 MEQ tablet TAKE 1 TABLET (20 MEQ TOTAL) BY MOUTH DAILY. PLEASE CALL FOR OFFICE VISIT [phone] 90 tablet 1  . levothyroxine (SYNTHROID, LEVOTHROID) 175 MCG tablet TAKE 1 TABLET (175 MCG TOTAL) BY MOUTH DAILY BEFORE BREAKFAST. 90 tablet 1  . Melatonin 10 MG TABS Take by mouth.    . metolazone (ZAROXOLYN) 2.5  MG tablet Take only as directed by CHF clinic 10 tablet 0  . Multiple Vitamins-Minerals (CENTRUM ADULTS PO) Take by mouth.    [name] omeprazole (PRILOSEC) 20 MG capsule Take 20 mg by mouth daily.     [name] PARoxetine (PAXIL) 40 MG tablet Take 1 tablet (40 mg total) by mouth every morning. 90 tablet 1  . potassium chloride SA (KLOR-CON) 20 MEQ tablet Take 40 mEq by mouth daily.    . rosuvastatin (CRESTOR) 10 MG tablet Take 1 tablet (10 mg total) by mouth daily. 90 tablet 3  . spironolactone (ALDACTONE) 25 MG tablet Take 0.5 tablets (12.5 mg total) by mouth daily. 45 tablet 3  . torsemide (DEMADEX) 20 MG tablet Take 2 tablets (40 mg total) by mouth daily. 60 tablet 3  . Turmeric 1053 MG TABS Take by mouth.     No current facility-administered medications for this encounter.    Vitals:   03/16/19 1323  BP: (!) 115/57  Pulse: (!) 101  SpO2: 99%  Weight: 101.4 kg (223 lb 9.6 oz)    PHYSICAL EXAM: General:  Well appearing. No resp difficulty HEENT: normal Neck: supple. no JVD. Carotids 2+ bilat; + bruits. No lymphadenopathy or thryomegaly appreciated. Cor: PMI nondisplaced. Irregular rate & rhythm. 3/6 AS s2 moderately diminished Lungs: clear Abdomen: soft, nontender, nondistended. No hepatosplenomegaly. No bruits or masses. Good bowel sounds. Extremities: no cyanosis, clubbing, rash, edema Neuro: alert & orientedx3, cranial nerves grossly intact. moves all 4 extremities w/o difficulty. Affect pleasant  ASSESSMENT & PLAN:  1) Acute on chronic diastolic HF in setting of severe AS - Volume status much improved with switch to torsemide and 20+ pound diuresis. I worry he may be a bit on the dry side.  - Cut back torsemide to 20 daily. Can take extra 20 if weight > 220 - Check labs today - Concern for cardiac amyloidosis. Will check cMRI in near future  2) Severe AS - marked progression of AS on echo in 10/20. Mean gradient now 70mmHG - will likely need TAVR. Has visit with Dr. Burt Knack on  Friday. - Plan R;/L cath Thursday  3) CAD - No current s/s ischemia. Last cardiac CT 2013 with 50-70% LAD - R/L cath this week. - Continue Crestor 10. Not on ASA due to Eliquis. Not on BB with bradycardia  4) CML - Following with Dr. Don Broach at [facility] - On dasatinib  5) Atrial fibrillation, permanent - Tikosyn stopped 5/17 due to persistent AF. - Remains in AF today. Rate mildly elevated in setting of volume depletion.  Check labs. Cut back torsemide - Continue Eliquis 5 mg BID. No bleeding  4) HTN - BP low now. Likely related to AS but concern for amyloid as well.  - Can start midodrine if progresses  5) Carotid Stenosis - Carotid duplex 12/2013; stable 0-39% bilateral ICA stenosis - Continue Crestor - F/u as needed  6) OSA - compliant with CPAP - may not need it any more with > 50 pound weight loss   Glori Bickers, MD 1:59 PM `

## 2019-03-18 ENCOUNTER — Telehealth (HOSPITAL_COMMUNITY): Payer: Self-pay | Admitting: *Deleted

## 2019-03-18 ENCOUNTER — Ambulatory Visit (HOSPITAL_COMMUNITY)
Admission: RE | Admit: 2019-03-18 | Discharge: 2019-03-18 | Disposition: A | Discharge status: Home / Self Care | Payer: Medicare Other | Attending: Internal Medicine | Admitting: Internal Medicine

## 2019-03-18 ENCOUNTER — Other Ambulatory Visit: Payer: Self-pay

## 2019-03-18 ENCOUNTER — Encounter (HOSPITAL_COMMUNITY)
Admission: RE | Disposition: A | Discharge status: Home / Self Care | Payer: Self-pay | Source: Home / Self Care | Attending: Internal Medicine

## 2019-03-18 DIAGNOSIS — E039 Hypothyroidism, unspecified: Secondary | ICD-10-CM | POA: Insufficient documentation

## 2019-03-18 DIAGNOSIS — E119 Type 2 diabetes mellitus without complications: Secondary | ICD-10-CM | POA: Insufficient documentation

## 2019-03-18 DIAGNOSIS — I4821 Permanent atrial fibrillation: Secondary | ICD-10-CM | POA: Diagnosis not present

## 2019-03-18 DIAGNOSIS — I2510 Atherosclerotic heart disease of native coronary artery without angina pectoris: Secondary | ICD-10-CM

## 2019-03-18 DIAGNOSIS — E669 Obesity, unspecified: Secondary | ICD-10-CM | POA: Insufficient documentation

## 2019-03-18 DIAGNOSIS — M1990 Unspecified osteoarthritis, unspecified site: Secondary | ICD-10-CM | POA: Diagnosis not present

## 2019-03-18 DIAGNOSIS — G4733 Obstructive sleep apnea (adult) (pediatric): Secondary | ICD-10-CM | POA: Insufficient documentation

## 2019-03-18 DIAGNOSIS — E785 Hyperlipidemia, unspecified: Secondary | ICD-10-CM | POA: Insufficient documentation

## 2019-03-18 DIAGNOSIS — I5033 Acute on chronic diastolic (congestive) heart failure: Secondary | ICD-10-CM | POA: Insufficient documentation

## 2019-03-18 DIAGNOSIS — I110 Hypertensive heart disease with heart failure: Secondary | ICD-10-CM | POA: Insufficient documentation

## 2019-03-18 DIAGNOSIS — I6523 Occlusion and stenosis of bilateral carotid arteries: Secondary | ICD-10-CM | POA: Insufficient documentation

## 2019-03-18 DIAGNOSIS — Z79890 Hormone replacement therapy (postmenopausal): Secondary | ICD-10-CM | POA: Insufficient documentation

## 2019-03-18 DIAGNOSIS — Z6827 Body mass index (BMI) 27.0-27.9, adult: Secondary | ICD-10-CM | POA: Diagnosis not present

## 2019-03-18 DIAGNOSIS — Z7901 Long term (current) use of anticoagulants: Secondary | ICD-10-CM | POA: Diagnosis not present

## 2019-03-18 DIAGNOSIS — I350 Nonrheumatic aortic (valve) stenosis: Secondary | ICD-10-CM | POA: Insufficient documentation

## 2019-03-18 DIAGNOSIS — K219 Gastro-esophageal reflux disease without esophagitis: Secondary | ICD-10-CM | POA: Diagnosis not present

## 2019-03-18 DIAGNOSIS — J439 Emphysema, unspecified: Secondary | ICD-10-CM | POA: Diagnosis not present

## 2019-03-18 DIAGNOSIS — Z79899 Other long term (current) drug therapy: Secondary | ICD-10-CM | POA: Insufficient documentation

## 2019-03-18 DIAGNOSIS — C9210 Chronic myeloid leukemia, BCR/ABL-positive, not having achieved remission: Secondary | ICD-10-CM | POA: Insufficient documentation

## 2019-03-18 DIAGNOSIS — I5022 Chronic systolic (congestive) heart failure: Secondary | ICD-10-CM

## 2019-03-18 HISTORY — PX: CARDIAC CATHETERIZATION: SHX172

## 2019-03-18 HISTORY — PX: RIGHT/LEFT HEART CATH AND CORONARY ANGIOGRAPHY: CATH118266

## 2019-03-18 LAB — POCT I-STAT EG7
Acid-Base Excess: 3 mmol/L — ABNORMAL HIGH (ref 0.0–2.0)
Acid-Base Excess: 3 mmol/L — ABNORMAL HIGH (ref 0.0–2.0)
Acid-Base Excess: 4 mmol/L — ABNORMAL HIGH (ref 0.0–2.0)
Acid-Base Excess: 5 mmol/L — ABNORMAL HIGH (ref 0.0–2.0)
Bicarbonate: 28.8 mmol/L — ABNORMAL HIGH (ref 20.0–28.0)
Bicarbonate: 28.8 mmol/L — ABNORMAL HIGH (ref 20.0–28.0)
Bicarbonate: 29.7 mmol/L — ABNORMAL HIGH (ref 20.0–28.0)
Bicarbonate: 31.1 mmol/L — ABNORMAL HIGH (ref 20.0–28.0)
Calcium, Ion: 1.12 mmol/L — ABNORMAL LOW (ref 1.15–1.40)
Calcium, Ion: 1.17 mmol/L (ref 1.15–1.40)
Calcium, Ion: 1.18 mmol/L (ref 1.15–1.40)
Calcium, Ion: 1.25 mmol/L (ref 1.15–1.40)
HCT: 30 % — ABNORMAL LOW (ref 39.0–52.0)
HCT: 33 % — ABNORMAL LOW (ref 39.0–52.0)
HCT: 33 % — ABNORMAL LOW (ref 39.0–52.0)
HCT: 34 % — ABNORMAL LOW (ref 39.0–52.0)
Hemoglobin: 10.2 g/dL — ABNORMAL LOW (ref 13.0–17.0)
Hemoglobin: 11.2 g/dL — ABNORMAL LOW (ref 13.0–17.0)
Hemoglobin: 11.2 g/dL — ABNORMAL LOW (ref 13.0–17.0)
Hemoglobin: 11.6 g/dL — ABNORMAL LOW (ref 13.0–17.0)
O2 Saturation: 76 %
O2 Saturation: 77 %
O2 Saturation: 78 %
O2 Saturation: 96 %
Potassium: 3.4 mmol/L — ABNORMAL LOW (ref 3.5–5.1)
Potassium: 3.5 mmol/L (ref 3.5–5.1)
Potassium: 3.6 mmol/L (ref 3.5–5.1)
Potassium: 3.7 mmol/L (ref 3.5–5.1)
Sodium: 141 mmol/L (ref 135–145)
Sodium: 142 mmol/L (ref 135–145)
Sodium: 142 mmol/L (ref 135–145)
Sodium: 143 mmol/L (ref 135–145)
TCO2: 30 mmol/L (ref 22–32)
TCO2: 30 mmol/L (ref 22–32)
TCO2: 31 mmol/L (ref 22–32)
TCO2: 33 mmol/L — ABNORMAL HIGH (ref 22–32)
pCO2, Ven: 47.1 mmHg (ref 44.0–60.0)
pCO2, Ven: 50.7 mmHg (ref 44.0–60.0)
pCO2, Ven: 50.8 mmHg (ref 44.0–60.0)
pCO2, Ven: 53.3 mmHg (ref 44.0–60.0)
pH, Ven: 7.362 (ref 7.250–7.430)
pH, Ven: 7.374 (ref 7.250–7.430)
pH, Ven: 7.375 (ref 7.250–7.430)
pH, Ven: 7.395 (ref 7.250–7.430)
pO2, Ven: 43 mmHg (ref 32.0–45.0)
pO2, Ven: 43 mmHg (ref 32.0–45.0)
pO2, Ven: 44 mmHg (ref 32.0–45.0)
pO2, Ven: 83 mmHg — ABNORMAL HIGH (ref 32.0–45.0)

## 2019-03-18 LAB — GLUCOSE, CAPILLARY: Glucose-Capillary: 132 mg/dL — ABNORMAL HIGH (ref 70–99)

## 2019-03-18 SURGERY — RIGHT/LEFT HEART CATH AND CORONARY ANGIOGRAPHY
Anesthesia: LOCAL

## 2019-03-18 MED ORDER — MIDAZOLAM HCL 2 MG/2ML IJ SOLN
INTRAMUSCULAR | Status: DC | PRN
  Administered 2019-03-18: 2 mg via INTRAVENOUS

## 2019-03-18 MED ORDER — SODIUM CHLORIDE 0.9 % IV SOLN: 250.0000 mL | INTRAVENOUS | Status: DC | PRN

## 2019-03-18 MED ORDER — ONDANSETRON HCL 4 MG/2ML IJ SOLN: 4.0000 mg | Freq: Four times a day (QID) | INTRAMUSCULAR | Status: DC | PRN

## 2019-03-18 MED ORDER — FENTANYL CITRATE (PF) 100 MCG/2ML IJ SOLN
INTRAMUSCULAR | Status: DC | PRN
  Administered 2019-03-18: 25 ug via INTRAVENOUS

## 2019-03-18 MED ORDER — FENTANYL CITRATE (PF) 100 MCG/2ML IJ SOLN
INTRAMUSCULAR | Status: AC
  Filled 2019-03-18: qty 2

## 2019-03-18 MED ORDER — LIDOCAINE HCL (PF) 1 % IJ SOLN
INTRAMUSCULAR | Status: DC | PRN
  Administered 2019-03-18 (×2): 2 mL

## 2019-03-18 MED ORDER — SODIUM CHLORIDE 0.9 % IV SOLN: INTRAVENOUS | Status: DC

## 2019-03-18 MED ORDER — HEPARIN SODIUM (PORCINE) 1000 UNIT/ML IJ SOLN
INTRAMUSCULAR | Status: DC | PRN
  Administered 2019-03-18: 5000 [IU] via INTRAVENOUS

## 2019-03-18 MED ORDER — MIDAZOLAM HCL 2 MG/2ML IJ SOLN
INTRAMUSCULAR | Status: AC
  Filled 2019-03-18: qty 2

## 2019-03-18 MED ORDER — IOHEXOL 350 MG/ML SOLN
INTRAVENOUS | Status: DC | PRN
  Administered 2019-03-18: 50 mL

## 2019-03-18 MED ORDER — HEPARIN (PORCINE) IN NACL 1000-0.9 UT/500ML-% IV SOLN
INTRAVENOUS | Status: DC | PRN
  Administered 2019-03-18 (×2): 500 mL

## 2019-03-18 MED ORDER — LABETALOL HCL 5 MG/ML IV SOLN: 10.0000 mg | INTRAVENOUS | Status: DC | PRN

## 2019-03-18 MED ORDER — SODIUM CHLORIDE 0.9 % IV SOLN: INTRAVENOUS | Status: AC

## 2019-03-18 MED ORDER — HYDRALAZINE HCL 20 MG/ML IJ SOLN: 10.0000 mg | INTRAMUSCULAR | Status: DC | PRN

## 2019-03-18 MED ORDER — HEPARIN (PORCINE) IN NACL 1000-0.9 UT/500ML-% IV SOLN
INTRAVENOUS | Status: AC
  Filled 2019-03-18: qty 1000

## 2019-03-18 MED ORDER — SODIUM CHLORIDE 0.9% FLUSH: 3.0000 mL | Freq: Two times a day (BID) | INTRAVENOUS | Status: DC

## 2019-03-18 MED ORDER — VERAPAMIL HCL 2.5 MG/ML IV SOLN
INTRAVENOUS | Status: AC
  Filled 2019-03-18: qty 2

## 2019-03-18 MED ORDER — SODIUM CHLORIDE 0.9% FLUSH: 3.0000 mL | INTRAVENOUS | Status: DC | PRN

## 2019-03-18 MED ORDER — LIDOCAINE HCL (PF) 1 % IJ SOLN
INTRAMUSCULAR | Status: AC
  Filled 2019-03-18: qty 30

## 2019-03-18 MED ORDER — HEPARIN SODIUM (PORCINE) 1000 UNIT/ML IJ SOLN
INTRAMUSCULAR | Status: AC
  Filled 2019-03-18: qty 1

## 2019-03-18 MED ORDER — ASPIRIN 81 MG PO CHEW
81.0000 mg | CHEWABLE_TABLET | ORAL | Status: AC
  Administered 2019-03-18: 81 mg via ORAL
  Filled 2019-03-18: qty 1

## 2019-03-18 MED ORDER — VERAPAMIL HCL 2.5 MG/ML IV SOLN
INTRAVENOUS | Status: DC | PRN
  Administered 2019-03-18: 15:00:00 10 mL via INTRA_ARTERIAL

## 2019-03-18 MED ORDER — ACETAMINOPHEN 325 MG PO TABS: 650.0000 mg | ORAL_TABLET | ORAL | Status: DC | PRN

## 2019-03-18 SURGICAL SUPPLY — 12 items
CATH 5FR JL3.5 JR4 ANG PIG MP (CATHETERS) ×1 IMPLANT
CATH BALLN WEDGE 5F 110CM (CATHETERS) ×1 IMPLANT
CATH INFINITI 5FR AL1 (CATHETERS) ×1 IMPLANT
DEVICE RAD COMP TR BAND LRG (VASCULAR PRODUCTS) ×1 IMPLANT
GLIDESHEATH SLEND SS 6F .021 (SHEATH) ×1 IMPLANT
GUIDEWIRE .025 260CM (WIRE) ×1 IMPLANT
GUIDEWIRE INQWIRE 1.5J.035X260 (WIRE) IMPLANT
INQWIRE 1.5J .035X260CM (WIRE) ×2
PACK CARDIAC CATHETERIZATION (CUSTOM PROCEDURE TRAY) ×2 IMPLANT
SHEATH GLIDE SLENDER 4/5FR (SHEATH) ×1 IMPLANT
TRANSDUCER W/STOPCOCK (MISCELLANEOUS) ×2 IMPLANT
WIRE EMERALD ST .035X150CM (WIRE) ×1 IMPLANT

## 2019-03-18 NOTE — Interval H&P Note (Signed)
History and Physical Interval Note:  03/18/2019 2:24 PM  [name] White  has presented today for surgery, with the diagnosis of aortic stenosis.  The various methods of treatment have been discussed with the patient and family. After consideration of risks, benefits and other options for treatment, the patient has consented to  Procedure(s): RIGHT/LEFT HEART CATH AND CORONARY ANGIOGRAPHY (N/A) as a surgical intervention.  The patient's history has been reviewed, patient examined, no change in status, stable for surgery.  I have reviewed the patient's chart and labs.  Questions were answered to the patient's satisfaction.     Emmalena Canny

## 2019-03-18 NOTE — Telephone Encounter (Signed)
pts wife left VM requesting a phone call from [place] about pts current condition and results from his cath today. Message sent to [place].

## 2019-03-18 NOTE — Discharge Instructions (Signed)
Radial Site Care ° °This sheet gives you information about how to care for yourself after your procedure. Your health care provider may also give you more specific instructions. If you have problems or questions, contact your health care provider. °What can I expect after the procedure? °After the procedure, it is common to have: °· Bruising and tenderness at the catheter insertion area. °Follow these instructions at home: °Medicines °· Take over-the-counter and prescription medicines only as told by your health care provider. °Insertion site care °· Follow instructions from your health care provider about how to take care of your insertion site. Make sure you: °? Wash your hands with soap and water before you change your bandage (dressing). If soap and water are not available, use hand sanitizer. °? Change your dressing as told by your health care provider. °? Leave stitches (sutures), skin glue, or adhesive strips in place. These skin closures may need to stay in place for 2 weeks or longer. If adhesive strip edges start to loosen and curl up, you may trim the loose edges. Do not remove adhesive strips completely unless your health care provider tells you to do that. °· Check your insertion site every day for signs of infection. Check for: °? Redness, swelling, or pain. °? Fluid or blood. °? Pus or a bad smell. °? Warmth. °· Do not take baths, swim, or use a hot tub until your health care provider approves. °· You may shower 24-48 hours after the procedure, or as directed by your health care provider. °? Remove the dressing and gently wash the site with plain soap and water. °? Pat the area dry with a clean towel. °? Do not rub the site. That could cause bleeding. °· Do not apply powder or lotion to the site. °Activity ° °· For 24 hours after the procedure, or as directed by your health care provider: °? Do not flex or bend the affected arm. °? Do not push or pull heavy objects with the affected arm. °? Do not  drive yourself home from the hospital or clinic. You may drive 24 hours after the procedure unless your health care provider tells you not to. °? Do not operate machinery or power tools. °· Do not lift anything that is heavier than 10 lb (4.5 kg), or the limit that you are told, until your health care provider says that it is safe. °· Ask your health care provider when it is okay to: °? Return to work or school. °? Resume usual physical activities or sports. °? Resume sexual activity. °General instructions °· If the catheter site starts to bleed, raise your arm and put firm pressure on the site. If the bleeding does not stop, get help right away. This is a medical emergency. °· If you went home on the same day as your procedure, a responsible adult should be with you for the first 24 hours after you arrive home. °· Keep all follow-up visits as told by your health care provider. This is important. °Contact a health care provider if: °· You have a fever. °· You have redness, swelling, or yellow drainage around your insertion site. °Get help right away if: °· You have unusual pain at the radial site. °· The catheter insertion area swells very fast. °· The insertion area is bleeding, and the bleeding does not stop when you hold steady pressure on the area. °· Your arm or hand becomes pale, cool, tingly, or numb. °These symptoms may represent a serious problem   that is an emergency. Do not wait to see if the symptoms will go away. Get medical help right away. Call your local emergency services (911 in the [place] Do not drive yourself to the hospital. °Summary °· After the procedure, it is common to have bruising and tenderness at the site. °· Follow instructions from your health care provider about how to take care of your radial site wound. Check the wound every day for signs of infection. °· Do not lift anything that is heavier than 10 lb (4.5 kg), or the limit that you are told, until your health care provider says  that it is safe. °This information is not intended to replace advice given to you by your health care provider. Make sure you discuss any questions you have with your health care provider. °Document Released: 04/20/2010 Document Revised: 04/23/2017 Document Reviewed: 04/23/2017 °Elsevier Patient Education © 2020 [employer] ° °

## 2019-03-19 ENCOUNTER — Ambulatory Visit (INDEPENDENT_AMBULATORY_CARE_PROVIDER_SITE_OTHER): Payer: Medicare Other | Admitting: Cardiovascular Disease

## 2019-03-19 ENCOUNTER — Other Ambulatory Visit: Payer: Self-pay

## 2019-03-19 VITALS — BP 132/70 | HR 100 | Ht 74.0 in | Wt 228.4 lb

## 2019-03-19 DIAGNOSIS — I350 Nonrheumatic aortic (valve) stenosis: Secondary | ICD-10-CM | POA: Diagnosis not present

## 2019-03-19 MED ORDER — METOPROLOL TARTRATE 50 MG PO TABS: ORAL_TABLET | ORAL | 0 refills | Status: DC

## 2019-03-19 NOTE — Progress Notes (Signed)
[facility]   MULTIDISCIPLINARY HEART VALVE TEAM  Date:  03/19/2019   ID:  [name] White, DOB September 12, 1935, MRN [id number]  PCP:  [name] Maw, MD   Chief Complaint  Patient presents with  . Shortness of Breath     HISTORY OF PRESENT ILLNESS: [name] White is a 83 y.o. male who presents for evaluation of Severe aortic stenosis, referred by Dr [name] White.   The patient is here alone today. He underwent cardiac catheterization yesterday. He is married and lives locally in [place]. 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   He has been diagnosed with CML and he is undergoing treatment through the [facility] ([place]).  He also tore his rotator cuff earlier this year.  His cardiac hx dates back many years with atrial arrhythmias. He underwent flutter ablation in 2010 by Dr [name] White. He later developed atrial fibrillation and was managed with Tikosyn and Apixaban. He has subsequently developed permanent atrial fibrillation and is no longer on Tikosyn. He's been followed by Dr [name] White who has been regularly following the patient's AS with serial echo studies. The patient has developed worsening shortness of breath in October of 2020 and ultimately went to [facility] 03/09/2019 with marked dyspnea. He was short of breath with conversation and minimal activity. He has had orthopnea but denies PND. He was given IV lasix with an excellent response and he saw Dr [name] White the following day. He was transitioned to torsemide and he's doing much better now.  He denies chest pain or pressure.  He denies lightheadedness or syncope.  The patient has had regular dental care and reports good dental health without any problems.  Past Medical History:  Diagnosis Date  . Anemia 06/04/2015  . Anxiety   . Atrial flutter ([place])    s/p ablation 07/17/08; echo EF 60-65% trivival AI  . Bradycardia   . Chicken pox as a child  .  Complication of anesthesia    "during ablation-heard people talking and he was groaning"  . Coronary artery disease    "slight"  . Diabetes mellitus type 2 in obese [name]) 04/10/2009   Qualifier: Diagnosis of  By: [name] Morale MD, [name] White   . Dysrhythmia    a-fib.   . Emphysema of lung ([place])   . GERD (gastroesophageal reflux disease)   . Glucose intolerance (impaired glucose tolerance)   . H/O hiatal hernia   . Heart murmur    "slight"  . Hepatitis    h/o of type A  . Hypertension   . Hypothyroidism   . Measles as a child  . Micturition syncope   . Mumps as a child  . Osteoarthritis   . Preventative health care 12/10/2015  . Sleep apnea   . Vitamin D deficiency 11/24/2015    Current Outpatient Medications  Medication Sig Dispense Refill  . Biotin 10000 MCG TABS Take 10,000 mcg by mouth daily.     [name] CINNAMON PO Take 2,000 mg by mouth daily.     . Coenzyme Q10 ([employer] COQ-10) 200 MG capsule Take 200 mg by mouth daily.    . dasatinib (SPRYCEL) 100 MG tablet Take 100 mg by mouth daily.    [name] ELIQUIS 5 MG TABS tablet TAKE 1 TABLET BY MOUTH TWICE A DAY (Patient taking differently: Take 5 mg by mouth 2 (two) times daily. ) 180 tablet 3  . ergocalciferol (VITAMIN D2) 1.25 MG (50000 UT) capsule Take  50,000 Units by mouth every Monday.    . famotidine (PEPCID) 20 MG tablet Take 20 mg by mouth 2 (two) times daily before a meal.    . fenofibrate (TRICOR) 145 MG tablet TAKE 1 TABLET BY MOUTH EVERY DAY (Patient not taking: Reported on 03/17/2019) 90 tablet 1  . ferrous sulfate 325 (65 FE) MG tablet Take 325 mg by mouth 2 (two) times daily with a meal.    . finasteride (PROSCAR) 5 MG tablet Take 5 mg by mouth every evening.     [name] KLOR-CON M20 20 MEQ tablet TAKE 1 TABLET (20 MEQ TOTAL) BY MOUTH DAILY. PLEASE CALL FOR OFFICE VISIT [phone] (Patient taking differently: Take 20 mEq by mouth daily. ) 90 tablet 1  . levothyroxine (SYNTHROID) 200 MCG tablet Take 200 mcg by mouth daily before breakfast.    .  levothyroxine (SYNTHROID, LEVOTHROID) 175 MCG tablet TAKE 1 TABLET (175 MCG TOTAL) BY MOUTH DAILY BEFORE BREAKFAST. (Patient not taking: Reported on 03/17/2019) 90 tablet 1  . metolazone (ZAROXOLYN) 2.5 MG tablet Take only as directed by CHF clinic (Patient taking differently: Take 2.5 mg by mouth daily as needed (swelling). ) 10 tablet 0  . PARoxetine (PAXIL) 40 MG tablet Take 1 tablet (40 mg total) by mouth every morning. 90 tablet 1  . protein supplement shake (PREMIER PROTEIN) LIQD Take 2 oz by mouth daily.    . rosuvastatin (CRESTOR) 10 MG tablet Take 1 tablet (10 mg total) by mouth daily. 90 tablet 3  . spironolactone (ALDACTONE) 25 MG tablet Take 0.5 tablets (12.5 mg total) by mouth daily. 45 tablet 3  . torsemide (DEMADEX) 20 MG tablet Take 1 tablet (20 mg total) by mouth daily. May take and additional tab in PM (Patient taking differently: Take 20 mg by mouth See admin instructions. Take 20 mg in the morning, may take a second 20 mg tab in the evening as needed for swelling) 60 tablet 3  . traZODone (DESYREL) 50 MG tablet Take 50 mg by mouth at bedtime.    . TURMERIC PO Take 1,000 mg by mouth daily.     No current facility-administered medications for this visit.    ALLERGIES:   Ace inhibitors, Benazepril, Codeine, Statins, Piroxicam, and Pseudoephedrine   SOCIAL HISTORY:  The patient  reports that he quit smoking about 22 years ago. His smoking use included pipe and cigars. He quit after 0.00 years of use. He quit smokeless tobacco use about 9 years ago.  His smokeless tobacco use included chew. He reports previous alcohol use. He reports that he does not use drugs.   FAMILY HISTORY:  The patient's family history includes Heart disease in his maternal grandfather and mother; Hypertension in his son; Multiple sclerosis in his father.   REVIEW OF SYSTEMS: Negative except as per HPI  PHYSICAL EXAM: VS:  BP 132/70   Pulse 100   Ht 6\' 2"  (1.88 m)   Wt 228 lb 6.4 oz (103.6 kg)   SpO2  93%   BMI 29.32 kg/m  , BMI Body mass index is 29.32 kg/m. GEN: Well nourished, well developed, in no acute distress HEENT: normal Neck: No JVD. carotids 2+ with bilateral bruits Cardiac: The heart is RRR with 3/6 harsh late peaking systolic murmur at the right upper sternal border, diminished A2. No edema. Pedal pulses 2+ = bilaterally  Respiratory:  clear to auscultation bilaterally GI: soft, nontender, nondistended, + BS MS: no deformity or atrophy Skin: warm and dry, no rash Neuro:  Strength and sensation are intact Psych: euthymic mood, full affect  EKG:  EKG from 03/10/2019 reviewed and demonstrates atrial fibrillation, left IVCD  RECENT LABS: 03/09/2019: ALT 21 03/10/2019: B Natriuretic Peptide 218.9 03/16/2019: BUN 36; Creatinine, Ser 1.23; Platelets 181 03/18/2019: Hemoglobin 10.2; Potassium 3.4; Sodium 142  No results found for requested labs within last 8760 hours.   Estimated Creatinine Clearance: 60.5 mL/min (by C-G formula based on SCr of 1.23 mg/dL).   Wt Readings from Last 3 Encounters:  03/19/19 228 lb 6.4 oz (103.6 kg)  03/18/19 216 lb (98 kg)  03/16/19 223 lb 9.6 oz (101.4 kg)     CARDIAC STUDIES:  Echo:  FINDINGS  Left Ventricle: Left ventricular ejection fraction, by visual estimation, is 55 to 60%. The left ventricle has normal function. No evidence of left ventricular regional wall motion abnormalities. There is borderline left ventricular hypertrophy.  Concentric left ventricular hypertrophy. Normal left ventricular size. The left ventricular diastology could not be evaluatedsecondary to atrial fibrillation. Spectral Doppler shows Left ventricular diastolic function could not be evaluated pattern of LV  diastolic filling.  Right Ventricle: The right ventricular size is normal. No increase in right ventricular wall thickness. Global RV systolic function is has mildly reduced systolic function. The tricuspid regurgitant velocity is 3.32 m/s, and with an  assumed right atrial  pressure of 8 mmHg, the estimated right ventricular systolic pressure is moderately elevated at 52.0 mmHg.  Left Atrium: Left atrial size was severely dilated.  Right Atrium: Right atrial size was mild-moderately dilated  Pericardium: A small pericardial effusion is present. The pericardial effusion is circumferential. There is a large pleural effusion in the left lateral region.  Mitral Valve: The mitral valve is normal in structure. Mild mitral annular calcification. Mild to moderate mitral valve regurgitation, with centrally-directed jet.  Tricuspid Valve: The tricuspid valve is normal in structure. Tricuspid valve regurgitation mild-moderate by color flow Doppler.  Aortic Valve: The aortic valve is tricuspid. Aortic valve regurgitation was not visualized by color flow Doppler. Severe aortic stenosis is present. Aortic valve mean gradient measures 41.5 mmHg. Aortic valve peak gradient measures 62.4 mmHg. Aortic  valve area, by VTI measures 0.73 cm.  Pulmonic Valve: The pulmonic valve was grossly normal. Pulmonic valve regurgitation is not visualized by color flow Doppler.  Aorta: The aortic root and ascending aorta are structurally normal, with no evidence of dilitation.  IAS/Shunts: No atrial level shunt detected by color flow Doppler.     LEFT VENTRICLE PLAX 2D LVIDd:         4.60 cm LVIDs:         3.20 cm LV PW:         1.30 cm LV IVS:        1.10 cm LVOT diam:     2.15 cm LV SV:         56 ml LV SV Index:   23.41 LVOT Area:     3.63 cm   LV Volumes (MOD) LV area d, A2C:    26.40 cm LV area d, A4C:    26.50 cm LV area s, A2C:    17.50 cm LV area s, A4C:    13.10 cm LV major d, A2C:   8.03 cm LV major d, A4C:   7.30 cm LV major s, A2C:   7.20 cm LV major s, A4C:   5.95 cm LV vol d, MOD A2C: 70.1 ml LV vol d, MOD A4C: 79.1 ml LV vol s, MOD A2C: 35.3  ml LV vol s, MOD A4C: 23.9 ml LV SV MOD A2C:     34.8 ml LV SV MOD A4C:      79.1 ml LV SV MOD BP:      46.1 ml  RIGHT VENTRICLE TAPSE (M-mode): 1.1 cm  LEFT ATRIUM             Index       RIGHT ATRIUM           Index LA diam:        5.60 cm 2.37 cm/m  RA Area:     23.20 cm LA Vol (A2C):   69.0 ml 29.23 ml/m RA Volume:   72.90 ml  30.89 ml/m LA Vol (A4C):   91.0 ml 38.56 ml/m LA Biplane Vol: 84.3 ml 35.72 ml/m  AORTIC VALVE AV Area (Vmax):    0.87 cm AV Area (Vmean):   0.75 cm AV Area (VTI):     0.73 cm AV Vmax:           395.00 cm/s AV Vmean:          312.000 cm/s AV VTI:            0.950 m AV Peak Grad:      62.4 mmHg AV Mean Grad:      41.5 mmHg LVOT Vmax:         95.00 cm/s LVOT Vmean:        64.300 cm/s LVOT VTI:          0.192 m LVOT/AV VTI ratio: 0.20   AORTA Ao Root diam: 3.30 cm Ao Asc diam:  3.30 cm  MITRAL VALVE                 TRICUSPID VALVE MV Area (PHT): 3.65 cm      TR Peak grad:   44.0 mmHg MV PHT:        60.32 msec    TR Vmax:        334.00 cm/s MV Decel Time: 208 msec MR Peak grad:    164.9 mmHg  SHUNTS MR Mean grad:    105.0 mmHg  Systemic VTI:  0.19 m MR Vmax:         642.00 cm/s Systemic Diam: 2.15 cm MR Vmean:        480.0 cm/s MR PISA:         1.57 cm MR PISA Eff ROA: 9 mm MR PISA Radius:  0.50 cm MV E velocity: 113.00 cm/s   Cardiac Cath:  Conclusion    Prox RCA to Mid RCA lesion is 30% stenosed.  RPAV lesion is 30% stenosed.  Ost Cx to Prox Cx lesion is 50% stenosed.  Prox LAD to Mid LAD lesion is 20% stenosed.   Findings:  Ao = 82/51(65)  Lv = 138/8   RA = 5 RV = 38/6 PA = 37/13 (22) PCW = 14  Fick cardiac output/index = 9.8/4.3 PVR = 0.7 WU FA sat = 96% PA sat = 78%, 77% High SVC 75%  AoV = peak gradient 70mm HG, mean gradient 36 mmHG, AVA 1.4 cm2  Assessment:  1. Mild non-obstructive CAD 2. Moderate to severe AS 3.  Normal LV function by echo  Plan/Discussion:  Refer for TAVR eval.    Coronary Findings  Diagnostic Dominance: Right Left Anterior Descending    Prox LAD to Mid LAD lesion 20% stenosed  Prox LAD to Mid LAD lesion is 20% stenosed.  Left Circumflex  Ost Cx  to Prox Cx lesion 50% stenosed  Ost Cx to Prox Cx lesion is 50% stenosed.  Right Coronary Artery  Prox RCA to Mid RCA lesion 30% stenosed  Prox RCA to Mid RCA lesion is 30% stenosed.  Right Posterior Atrioventricular Artery  RPAV lesion 30% stenosed  RPAV lesion is 30% stenosed.  Intervention  No interventions have been documented. Coronary Diagrams  Diagnostic Dominance: Right     STS RISK CALCULATOR: Isolated aortic valve replacement: Risk of Mortality: 1.834% Renal Failure: 3.813% Permanent Stroke: 1.963% Prolonged Ventilation: 6.332% DSW Infection: 0.092% Reoperation: 4.181% Morbidity or Mortality: 11.655% Short Length of Stay: 28.621% Long Length of Stay: 6.300%  ASSESSMENT AND PLAN: 1.  Severe, stage D1 aortic stenosis associated with New York Heart Association functional class III symptoms of chronic diastolic heart failure with recent heart failure exacerbation.  I have reviewed the natural history of aortic stenosis with the patient today. We have discussed the limitations of medical therapy and the poor prognosis associated with symptomatic aortic stenosis. We have reviewed potential treatment options, including palliative medical therapy, conventional surgical aortic valve replacement, and transcatheter aortic valve replacement. We discussed treatment options in the context of the patient's specific comorbid medical conditions which include permanent atrial fibrillation, chronic diastolic heart failure with recent exacerbation, advanced age, and CML.  I reviewed his cardiac catheterization films and agree that he has nonobstructive coronary artery disease.  The aortic valve is heavily calcified biplane fluoroscopy.  Review of his echo images demonstrates normal LV function, trileaflet aortic valve that is severely calcified with restricted leaflet  mobility of all 3 leaflets.  The peak transaortic velocity is 4.07 m/s with peak and mean gradients of 66 and 43 mmHg, respectively.  He does not appear to have any other significant valvular disease.  I have recommended proceeding with a gated CTA of the heart as well as a CTA of the chest, abdomen, and pelvis.  The studies will evaluate anatomic suitability for TAVR.  Once his CTA studies are completed, he will be referred for formal cardiac surgical consultation as part of a multidisciplinary heart valve team approach to his care.  We had extensive discussion about the TAVR procedure today.  We reviewed a video animation outlining the procedural steps.  We reviewed risks, indications, and alternatives to TAVR.  We discussed postoperative recovery and restrictions.  He understands and is eager to proceed with further evaluation and treatment.  Deatra James 03/19/2019 10:58 AM     [facility] [address] [place] [address]  [phone] (office) [phone] (fax)

## 2019-03-19 NOTE — H&P (View-Only) (Signed)
[facility]   MULTIDISCIPLINARY HEART VALVE TEAM  Date:  03/19/2019   ID:  [name] White, DOB 05/19/1935, MRN [id number]  PCP:  [name] Maw, MD   Chief Complaint  Patient presents with  . Shortness of Breath     HISTORY OF PRESENT ILLNESS: [name] White is a 83 y.o. male who presents for evaluation of Severe aortic stenosis, referred by Dr [name] White.   The patient is here alone today. He underwent cardiac catheterization yesterday. He is married and lives locally in [place]. 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   He has been diagnosed with CML and he is undergoing treatment through the [facility]  He also tore his rotator cuff earlier this year.  His cardiac hx dates back many years with atrial arrhythmias. He underwent flutter ablation in 2010 by Dr [name] White. He later developed atrial fibrillation and was managed with Tikosyn and Apixaban. He has subsequently developed permanent atrial fibrillation and is no longer on Tikosyn. He's been followed by Dr [name] White who has been regularly following the patient's AS with serial echo studies. The patient has developed worsening shortness of breath in October of 2020 and ultimately went to [facility] ER 03/09/2019 with marked dyspnea. He was short of breath with conversation and minimal activity. He has had orthopnea but denies PND. He was given IV lasix with an excellent response and he saw Dr [name] White the following day. He was transitioned to torsemide and he's doing much better now.  He denies chest pain or pressure.  He denies lightheadedness or syncope.  The patient has had regular dental care and reports good dental health without any problems.  Past Medical History:  Diagnosis Date  . Anemia 06/04/2015  . Anxiety   . Atrial flutter ([place])    s/p ablation 07/17/08; echo EF 60-65% trivival AI  . Bradycardia   . Chicken pox as a child  .  Complication of anesthesia    "during ablation-heard people talking and he was groaning"  . Coronary artery disease    "slight"  . Diabetes mellitus type 2 in obese [facility]) 04/10/2009   Qualifier: Diagnosis of  By: [name] Morale MD, Balinda Quails   . Dysrhythmia    a-fib.   . Emphysema of lung ([place])   . GERD (gastroesophageal reflux disease)   . Glucose intolerance (impaired glucose tolerance)   . H/O hiatal hernia   . Heart murmur    "slight"  . Hepatitis    h/o of type A  . Hypertension   . Hypothyroidism   . Measles as a child  . Micturition syncope   . Mumps as a child  . Osteoarthritis   . Preventative health care 12/10/2015  . Sleep apnea   . Vitamin D deficiency 11/24/2015    Current Outpatient Medications  Medication Sig Dispense Refill  . Biotin 10000 MCG TABS Take 10,000 mcg by mouth daily.     [name] CINNAMON PO Take 2,000 mg by mouth daily.     . Coenzyme Q10 ([employer] COQ-10) 200 MG capsule Take 200 mg by mouth daily.    . dasatinib (SPRYCEL) 100 MG tablet Take 100 mg by mouth daily.    [name] ELIQUIS 5 MG TABS tablet TAKE 1 TABLET BY MOUTH TWICE A DAY (Patient taking differently: Take 5 mg by mouth 2 (two) times daily. ) 180 tablet 3  . ergocalciferol (VITAMIN D2) 1.25 MG (50000 UT) capsule Take  50,000 Units by mouth every Monday.    . famotidine (PEPCID) 20 MG tablet Take 20 mg by mouth 2 (two) times daily before a meal.    . fenofibrate (TRICOR) 145 MG tablet TAKE 1 TABLET BY MOUTH EVERY DAY (Patient not taking: Reported on 03/17/2019) 90 tablet 1  . ferrous sulfate 325 (65 FE) MG tablet Take 325 mg by mouth 2 (two) times daily with a meal.    . finasteride (PROSCAR) 5 MG tablet Take 5 mg by mouth every evening.     [name] KLOR-CON M20 20 MEQ tablet TAKE 1 TABLET (20 MEQ TOTAL) BY MOUTH DAILY. PLEASE CALL FOR OFFICE VISIT [phone] (Patient taking differently: Take 20 mEq by mouth daily. ) 90 tablet 1  . levothyroxine (SYNTHROID) 200 MCG tablet Take 200 mcg by mouth daily before breakfast.    .  levothyroxine (SYNTHROID, LEVOTHROID) 175 MCG tablet TAKE 1 TABLET (175 MCG TOTAL) BY MOUTH DAILY BEFORE BREAKFAST. (Patient not taking: Reported on 03/17/2019) 90 tablet 1  . metolazone (ZAROXOLYN) 2.5 MG tablet Take only as directed by CHF clinic (Patient taking differently: Take 2.5 mg by mouth daily as needed (swelling). ) 10 tablet 0  . PARoxetine (PAXIL) 40 MG tablet Take 1 tablet (40 mg total) by mouth every morning. 90 tablet 1  . protein supplement shake (PREMIER PROTEIN) LIQD Take 2 oz by mouth daily.    . rosuvastatin (CRESTOR) 10 MG tablet Take 1 tablet (10 mg total) by mouth daily. 90 tablet 3  . spironolactone (ALDACTONE) 25 MG tablet Take 0.5 tablets (12.5 mg total) by mouth daily. 45 tablet 3  . torsemide (DEMADEX) 20 MG tablet Take 1 tablet (20 mg total) by mouth daily. May take and additional tab in PM (Patient taking differently: Take 20 mg by mouth See admin instructions. Take 20 mg in the morning, may take a second 20 mg tab in the evening as needed for swelling) 60 tablet 3  . traZODone (DESYREL) 50 MG tablet Take 50 mg by mouth at bedtime.    . TURMERIC PO Take 1,000 mg by mouth daily.     No current facility-administered medications for this visit.    ALLERGIES:   Ace inhibitors, Benazepril, Codeine, Statins, Piroxicam, and Pseudoephedrine   SOCIAL HISTORY:  The patient  reports that he quit smoking about 22 years ago. His smoking use included pipe and cigars. He quit after 0.00 years of use. He quit smokeless tobacco use about 9 years ago.  His smokeless tobacco use included chew. He reports previous alcohol use. He reports that he does not use drugs.   FAMILY HISTORY:  The patient's family history includes Heart disease in his maternal grandfather and mother; Hypertension in his son; Multiple sclerosis in his father.   REVIEW OF SYSTEMS: Negative except as per HPI  PHYSICAL EXAM: VS:  BP 132/70   Pulse 100   Ht 6\' 2"  (1.88 m)   Wt 228 lb 6.4 oz (103.6 kg)   SpO2  93%   BMI 29.32 kg/m  , BMI Body mass index is 29.32 kg/m. GEN: Well nourished, well developed, in no acute distress HEENT: normal Neck: No JVD. carotids 2+ with bilateral bruits Cardiac: The heart is RRR with 3/6 harsh late peaking systolic murmur at the right upper sternal border, diminished A2. No edema. Pedal pulses 2+ = bilaterally  Respiratory:  clear to auscultation bilaterally GI: soft, nontender, nondistended, + BS MS: no deformity or atrophy Skin: warm and dry, no rash Neuro:  Strength and sensation are intact Psych: euthymic mood, full affect  EKG:  EKG from 03/10/2019 reviewed and demonstrates atrial fibrillation, left IVCD  RECENT LABS: 03/09/2019: ALT 21 03/10/2019: B Natriuretic Peptide 218.9 03/16/2019: BUN 36; Creatinine, Ser 1.23; Platelets 181 03/18/2019: Hemoglobin 10.2; Potassium 3.4; Sodium 142  No results found for requested labs within last 8760 hours.   Estimated Creatinine Clearance: 60.5 mL/min (by C-G formula based on SCr of 1.23 mg/dL).   Wt Readings from Last 3 Encounters:  03/19/19 228 lb 6.4 oz (103.6 kg)  03/18/19 216 lb (98 kg)  03/16/19 223 lb 9.6 oz (101.4 kg)     CARDIAC STUDIES:  Echo:  FINDINGS  Left Ventricle: Left ventricular ejection fraction, by visual estimation, is 55 to 60%. The left ventricle has normal function. No evidence of left ventricular regional wall motion abnormalities. There is borderline left ventricular hypertrophy.  Concentric left ventricular hypertrophy. Normal left ventricular size. The left ventricular diastology could not be evaluatedsecondary to atrial fibrillation. Spectral Doppler shows Left ventricular diastolic function could not be evaluated pattern of LV  diastolic filling.  Right Ventricle: The right ventricular size is normal. No increase in right ventricular wall thickness. Global RV systolic function is has mildly reduced systolic function. The tricuspid regurgitant velocity is 3.32 m/s, and with an  assumed right atrial  pressure of 8 mmHg, the estimated right ventricular systolic pressure is moderately elevated at 52.0 mmHg.  Left Atrium: Left atrial size was severely dilated.  Right Atrium: Right atrial size was mild-moderately dilated  Pericardium: A small pericardial effusion is present. The pericardial effusion is circumferential. There is a large pleural effusion in the left lateral region.  Mitral Valve: The mitral valve is normal in structure. Mild mitral annular calcification. Mild to moderate mitral valve regurgitation, with centrally-directed jet.  Tricuspid Valve: The tricuspid valve is normal in structure. Tricuspid valve regurgitation mild-moderate by color flow Doppler.  Aortic Valve: The aortic valve is tricuspid. Aortic valve regurgitation was not visualized by color flow Doppler. Severe aortic stenosis is present. Aortic valve mean gradient measures 41.5 mmHg. Aortic valve peak gradient measures 62.4 mmHg. Aortic  valve area, by VTI measures 0.73 cm.  Pulmonic Valve: The pulmonic valve was grossly normal. Pulmonic valve regurgitation is not visualized by color flow Doppler.  Aorta: The aortic root and ascending aorta are structurally normal, with no evidence of dilitation.  IAS/Shunts: No atrial level shunt detected by color flow Doppler.     LEFT VENTRICLE PLAX 2D LVIDd:         4.60 cm LVIDs:         3.20 cm LV PW:         1.30 cm LV IVS:        1.10 cm LVOT diam:     2.15 cm LV SV:         56 ml LV SV Index:   23.41 LVOT Area:     3.63 cm   LV Volumes (MOD) LV area d, A2C:    26.40 cm LV area d, A4C:    26.50 cm LV area s, A2C:    17.50 cm LV area s, A4C:    13.10 cm LV major d, A2C:   8.03 cm LV major d, A4C:   7.30 cm LV major s, A2C:   7.20 cm LV major s, A4C:   5.95 cm LV vol d, MOD A2C: 70.1 ml LV vol d, MOD A4C: 79.1 ml LV vol s, MOD A2C: 35.3  ml LV vol s, MOD A4C: 23.9 ml LV SV MOD A2C:     34.8 ml LV SV MOD A4C:      79.1 ml LV SV MOD BP:      46.1 ml  RIGHT VENTRICLE TAPSE (M-mode): 1.1 cm  LEFT ATRIUM             Index       RIGHT ATRIUM           Index LA diam:        5.60 cm 2.37 cm/m  RA Area:     23.20 cm LA Vol (A2C):   69.0 ml 29.23 ml/m RA Volume:   72.90 ml  30.89 ml/m LA Vol (A4C):   91.0 ml 38.56 ml/m LA Biplane Vol: 84.3 ml 35.72 ml/m  AORTIC VALVE AV Area (Vmax):    0.87 cm AV Area (Vmean):   0.75 cm AV Area (VTI):     0.73 cm AV Vmax:           395.00 cm/s AV Vmean:          312.000 cm/s AV VTI:            0.950 m AV Peak Grad:      62.4 mmHg AV Mean Grad:      41.5 mmHg LVOT Vmax:         95.00 cm/s LVOT Vmean:        64.300 cm/s LVOT VTI:          0.192 m LVOT/AV VTI ratio: 0.20   AORTA Ao Root diam: 3.30 cm Ao Asc diam:  3.30 cm  MITRAL VALVE                 TRICUSPID VALVE MV Area (PHT): 3.65 cm      TR Peak grad:   44.0 mmHg MV PHT:        60.32 msec    TR Vmax:        334.00 cm/s MV Decel Time: 208 msec MR Peak grad:    164.9 mmHg  SHUNTS MR Mean grad:    105.0 mmHg  Systemic VTI:  0.19 m MR Vmax:         642.00 cm/s Systemic Diam: 2.15 cm MR Vmean:        480.0 cm/s MR PISA:         1.57 cm MR PISA Eff ROA: 9 mm MR PISA Radius:  0.50 cm MV E velocity: 113.00 cm/s   Cardiac Cath:  Conclusion    Prox RCA to Mid RCA lesion is 30% stenosed.  RPAV lesion is 30% stenosed.  Ost Cx to Prox Cx lesion is 50% stenosed.  Prox LAD to Mid LAD lesion is 20% stenosed.   Findings:  Ao = 82/51(65)  Lv = 138/8   RA = 5 RV = 38/6 PA = 37/13 (22) PCW = 14  Fick cardiac output/index = 9.8/4.3 PVR = 0.7 WU FA sat = 96% PA sat = 78%, 77% High SVC 75%  AoV = peak gradient 28mm HG, mean gradient 36 mmHG, AVA 1.4 cm2  Assessment:  1. Mild non-obstructive CAD 2. Moderate to severe AS 3.  Normal LV function by echo  Plan/Discussion:  Refer for TAVR eval.    Coronary Findings  Diagnostic Dominance: Right Left Anterior Descending    Prox LAD to Mid LAD lesion 20% stenosed  Prox LAD to Mid LAD lesion is 20% stenosed.  Left Circumflex  Ost Cx  to Prox Cx lesion 50% stenosed  Ost Cx to Prox Cx lesion is 50% stenosed.  Right Coronary Artery  Prox RCA to Mid RCA lesion 30% stenosed  Prox RCA to Mid RCA lesion is 30% stenosed.  Right Posterior Atrioventricular Artery  RPAV lesion 30% stenosed  RPAV lesion is 30% stenosed.  Intervention  No interventions have been documented. Coronary Diagrams  Diagnostic Dominance: Right     STS RISK CALCULATOR: Isolated aortic valve replacement: Risk of Mortality: 1.834% Renal Failure: 3.813% Permanent Stroke: 1.963% Prolonged Ventilation: 6.332% DSW Infection: 0.092% Reoperation: 4.181% Morbidity or Mortality: 11.655% Short Length of Stay: 28.621% Long Length of Stay: 6.300%  ASSESSMENT AND PLAN: 1.  Severe, stage D1 aortic stenosis associated with New York Heart Association functional class III symptoms of chronic diastolic heart failure with recent heart failure exacerbation.  I have reviewed the natural history of aortic stenosis with the patient today. We have discussed the limitations of medical therapy and the poor prognosis associated with symptomatic aortic stenosis. We have reviewed potential treatment options, including palliative medical therapy, conventional surgical aortic valve replacement, and transcatheter aortic valve replacement. We discussed treatment options in the context of the patient's specific comorbid medical conditions which include permanent atrial fibrillation, chronic diastolic heart failure with recent exacerbation, advanced age, and CML.  I reviewed his cardiac catheterization films and agree that he has nonobstructive coronary artery disease.  The aortic valve is heavily calcified biplane fluoroscopy.  Review of his echo images demonstrates normal LV function, trileaflet aortic valve that is severely calcified with restricted leaflet  mobility of all 3 leaflets.  The peak transaortic velocity is 4.07 m/s with peak and mean gradients of 66 and 43 mmHg, respectively.  He does not appear to have any other significant valvular disease.  I have recommended proceeding with a gated CTA of the heart as well as a CTA of the chest, abdomen, and pelvis.  The studies will evaluate anatomic suitability for TAVR.  Once his CTA studies are completed, he will be referred for formal cardiac surgical consultation as part of a multidisciplinary heart valve team approach to his care.  We had extensive discussion about the TAVR procedure today.  We reviewed a video animation outlining the procedural steps.  We reviewed risks, indications, and alternatives to TAVR.  We discussed postoperative recovery and restrictions.  He understands and is eager to proceed with further evaluation and treatment.  Deatra James 03/19/2019 10:58 AM     [facility] [address] [place] [address]  [phone] (office) [phone] (fax)

## 2019-03-23 ENCOUNTER — Other Ambulatory Visit: Payer: Self-pay | Admitting: Physician Assistant

## 2019-03-30 ENCOUNTER — Encounter: Payer: Self-pay | Admitting: Physician Assistant

## 2019-03-31 ENCOUNTER — Encounter: Payer: Self-pay | Admitting: Physical Therapy

## 2019-03-31 ENCOUNTER — Ambulatory Visit (HOSPITAL_COMMUNITY)
Admission: RE | Admit: 2019-03-31 | Discharge: 2019-03-31 | Disposition: A | Discharge status: Home / Self Care | Payer: Medicare Other | Source: Ambulatory Visit | Attending: Cardiovascular Disease | Admitting: Cardiovascular Disease

## 2019-03-31 ENCOUNTER — Other Ambulatory Visit: Payer: Self-pay

## 2019-03-31 ENCOUNTER — Ambulatory Visit: Payer: Medicare Other | Attending: Cardiovascular Disease | Admitting: Physical Therapy

## 2019-03-31 ENCOUNTER — Ambulatory Visit: Payer: Medicare Other | Admitting: Internal Medicine

## 2019-03-31 ENCOUNTER — Ambulatory Visit (HOSPITAL_BASED_OUTPATIENT_CLINIC_OR_DEPARTMENT_OTHER)
Admission: RE | Admit: 2019-03-31 | Discharge: 2019-03-31 | Disposition: A | Discharge status: Home / Self Care | Payer: Medicare Other | Source: Ambulatory Visit | Attending: Cardiovascular Disease | Admitting: Cardiovascular Disease

## 2019-03-31 DIAGNOSIS — I350 Nonrheumatic aortic (valve) stenosis: Secondary | ICD-10-CM | POA: Insufficient documentation

## 2019-03-31 DIAGNOSIS — K5730 Diverticulosis of large intestine without perforation or abscess without bleeding: Secondary | ICD-10-CM | POA: Diagnosis not present

## 2019-03-31 DIAGNOSIS — I700 Atherosclerosis of aorta: Secondary | ICD-10-CM | POA: Diagnosis not present

## 2019-03-31 DIAGNOSIS — R2689 Other abnormalities of gait and mobility: Secondary | ICD-10-CM | POA: Diagnosis not present

## 2019-03-31 MED ORDER — IOHEXOL 350 MG/ML SOLN
100.0000 mL | Freq: Once | INTRAVENOUS | Status: AC | PRN
  Administered 2019-03-31: 100 mL via INTRAVENOUS

## 2019-03-31 NOTE — Therapy (Signed)
[place], [place], [address] Phone: [phone]   Fax:  [phone]  Physical Therapy Evaluation  Patient Details  Name: [name] White MRN: [id number] Date of Birth: 11/07/1935 Referring Provider (PT): Sherren Mocha, MD   Encounter Date: 03/31/2019  PT End of Session - 03/31/19 1318    Visit Number  1    Authorization Type  [employer] MCR    PT Start Time  1218    PT Stop Time  1253    PT Time Calculation (min)  35 min    Equipment Utilized During Treatment  Gait belt    Activity Tolerance  Patient tolerated treatment well    Behavior During Therapy  [facility] for tasks assessed/performed       Past Medical History:  Diagnosis Date  . Anemia 06/04/2015  . Anxiety   . Atrial flutter ([place])    s/p ablation 07/17/08  . Chronic diastolic CHF (congestive heart failure) ([place])   . Coronary artery disease    "slight"  . Diabetes mellitus type 2 in obese ([place]) 04/10/2009   Qualifier: Diagnosis of  By: Arnoldo Morale MD, Balinda Quails   . Emphysema of lung ([place])   . GERD (gastroesophageal reflux disease)   . H/O hiatal hernia   . Hepatitis    h/o of type A  . History of measles as a child   . History of mumps as a child   . Hypertension   . Hypothyroidism   . Micturition syncope   . Osteoarthritis   . Severe aortic stenosis   . Sleep apnea   . Vitamin D deficiency 11/24/2015    Past Surgical History:  Procedure Laterality Date  . CARDIAC CATHETERIZATION    . CARDIAC ELECTROPHYSIOLOGY MAPPING AND ABLATION  06/2008  . CATARACT EXTRACTION W/ INTRAOCULAR LENS  IMPLANT, BILATERAL  ~ 2007  . COLONOSCOPY    . FOOT TENDON SURGERY Left   . RIGHT/LEFT HEART CATH AND CORONARY ANGIOGRAPHY N/A 03/18/2019   Procedure: RIGHT/LEFT HEART CATH AND CORONARY ANGIOGRAPHY;  Surgeon: Jolaine Artist, MD;  Location: [place] [facility];  Service: Cardiovascular;  Laterality: N/A;  . TONSILLECTOMY  1940's  . TOTAL KNEE ARTHROPLASTY  1990's    right  . TOTAL KNEE ARTHROPLASTY Left 08/02/2013   Procedure: LEFT TOTAL KNEE ARTHROPLASTY;  Surgeon: Gearlean Alf, MD;  Location: [facility];  Service: Orthopedics;  Laterality: Left;  . WISDOM TOOTH EXTRACTION      There were no vitals filed for this visit.   Subjective Assessment - 03/31/19 1312    Subjective  Patient reports worsening shortness of breathe and fatigue with walking and household activity.    Pertinent History  Leukemia and currently taking chemo pills, right RTC tear, bilateral TKA    Limitations  Walking;House hold activities    How long can you sit comfortably?  No limitation    How long can you stand comfortably?  20-30 minutes    How long can you walk comfortably?  5-10 minutes    Patient Stated Goals  Get heart better    Currently in Pain?  No/denies         [facility] PT Assessment - 03/31/19 0001      Assessment   Medical Diagnosis  Severe Aortic Stenosis    Referring Provider (PT)  Sherren Mocha, MD    Onset Date/Surgical Date  --   Patient reports this all started around the 1st of the year  Hand Dominance  Right      Precautions   Precautions  None      Restrictions   Weight Bearing Restrictions  No      Balance Screen   Has the patient fallen in the past 6 months  No    Has the patient had a decrease in activity level because of a fear of falling?   No    Is the patient reluctant to leave their home because of a fear of falling?   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      Prior Function   Level of [place] with basic ADLs      Cognition   Overall Cognitive Status  Within Functional Limits for tasks assessed      Observation/Other Assessments   Observations  Patient appears in no apparent distress      Posture/Postural Control   Posture Comments  Patient exhibit rounded shoulder and forward  head posture      ROM / Strength   AROM / PROM / Strength  AROM;Strength      AROM   Overall AROM Comments  Right arm AROM limited in all directions secondary to patient reported RTC tear, all other [facility]      Strength   Overall Strength Comments  Grossly 4/5 except right arm weakness in all planes    Strength Assessment Site  Hand    Right/Left hand  Right;Left    Right Hand Grip (lbs)  38    Left Hand Grip (lbs)  36      Transfers   Transfers  Independent with all Transfers   use of SPC     Ambulation/Gait   Ambulation/Gait  Yes    Ambulation/Gait Assistance  6: Modified independent (Device/Increase time)    Assistive device  Straight cane    Gait Comments  Patient ambulates with SPC on right       [facility] Pre-Surgical Assessment - 03/31/19 0001    5 Meter Walk Test- trial 1  5 sec    5 Meter Walk Test- trial 2  5 sec.     5 Meter Walk Test- trial 3  5 sec.    5 meter walk test average  5 sec    4 Stage Balance Test tolerated for:   10 sec.    4 Stage Balance Test Position  2    comment  Unable to attain ful tandem    Sit To Stand Test- trial 1  0 sec.    Comment  Unable to stand without use of UEs, patient reports he does not feel well due to not eating all day today    ADL/IADL Independent with:  Bathing;Dressing;Meal prep;Finances    ADL/IADL Needs Assistance with:  [employer] work    ADL/IADL Fraility Index  Midly frail    Other comment  5/12    6 Minute Walk- Baseline  yes    BP (mmHg)  135/87    HR (bpm)  66    02 Sat (%RA)  96 %    Modified Borg Scale for Dyspnea  0- Nothing at all    Perceived Rate of Exertion (Borg)  6-    6 Minute Walk Post Test  yes    BP (mmHg)  127/84    HR (  bpm)  78    02 Sat (%RA)  88 %    Modified Borg Scale for Dyspnea  2- Mild shortness of breath    Perceived Rate of Exertion (Borg)  11- Fairly light    Aerobic Endurance Distance Walked  995    Endurance additional comments  No breaks needed              Objective measurements  completed on examination: See above findings.              PT Education - 03/31/19 1316    Education Details  Gait form, endurance and modifying activity as needed    Person(s) Educated  Patient    Methods  Explanation    Comprehension  Verbalized understanding                  Plan - 03/31/19 1318    Clinical Impression Statement  See below    Personal Factors and Comorbidities  Age;Past/Current Experience;Time since onset of injury/illness/exacerbation    Examination-Activity Limitations  Locomotion Level    Examination-Participation Restrictions  Community Activity;[employer]ard Work    Stability/Clinical Decision Making  Stable/Uncomplicated    Clinical Decision Making  Low    Rehab Potential  Good    PT Frequency  One time visit    PT Treatment/Interventions  ADLs/Self Care Home Management;Gait training;Balance training;Therapeutic exercise    PT Next Visit Plan  NA    PT Home Exercise Plan  NA    Consulted and Agree with Plan of Care  Patient      Clinical Impression Statement: Pt is a 83 yo male presenting to OP PT for evaluation prior to possible TAVR surgery due to severe aortic stenosis. Pt reports onset of shortness of breath approximately 12 months ago. Symptoms are limiting walking and household activities. Pt presents with impaired right shoulder ROM and strength, decreased balance and is at high fall risk 4 stage balance test, good walking speed and moderate aerobic endurance per 6 minute walk test. Pt ambulated a total of 995 feet in 6 minute walk. Shortness of breath increased moderately with 6 minute walk test. Based on the Short Physical Performance Battery, patient has a frailty rating of 5/12 with </= 5/12 considered frail.   Patient demonstrates the following deficits and impairments:  Abnormal gait, Cardiopulmonary status limiting activity, Decreased endurance, Decreased balance, Decreased strength, Decreased range of motion  Visit Diagnosis: Other  abnormalities of gait and mobility     Problem List Patient Active Problem List   Diagnosis Date Noted  . Medication side effect 05/19/2018  . Preventative health care 12/10/2015  . Vitamin D deficiency 11/24/2015  . Anemia 06/04/2015  . Heart murmur 10/18/2014  . Measles   . Mumps   . Postoperative anemia due to acute blood loss 08/03/2013  . OA (osteoarthritis) of knee 08/01/2013  . Allergic rhinitis 07/14/2013  . Biceps rupture, distal 09/16/2012  . Carotid bruit 01/06/2012  . Coronary atherosclerosis of native coronary artery 08/31/2011  . Atrial fibrillation ([place]) 06/25/2011  . Diabetes mellitus type 2 in obese ([place]) 04/10/2009  . Bradycardia 03/13/2009  . ATRIAL FLUTTER 11/22/2008  . Obstructive sleep apnea 08/29/2008  . Hypothyroidism 09/23/2006  . Hyperlipidemia 09/23/2006  . Depression with anxiety 09/23/2006  . Essential hypertension 09/23/2006  . GERD 09/23/2006    Hilda Blades, PT, DPT, LAT, ATC 03/31/19  1:23 PM Phone: [phone] Fax: [place],  [place], [address] Phone: [phone]   Fax:  [phone]  Name: [name] White MRN: [id number] Date of Birth: October 12, 1935

## 2019-04-05 ENCOUNTER — Institutional Professional Consult (permissible substitution): Payer: Medicare Other | Admitting: Thoracic Surgery (Cardiothoracic Vascular Surgery)

## 2019-04-05 ENCOUNTER — Other Ambulatory Visit: Payer: Self-pay

## 2019-04-05 ENCOUNTER — Encounter: Payer: Self-pay | Admitting: Thoracic Surgery (Cardiothoracic Vascular Surgery)

## 2019-04-05 VITALS — BP 152/85 | HR 92 | Temp 97.5°F | Resp 16 | Ht 74.0 in | Wt 215.2 lb

## 2019-04-05 DIAGNOSIS — I350 Nonrheumatic aortic (valve) stenosis: Secondary | ICD-10-CM

## 2019-04-05 DIAGNOSIS — I4819 Other persistent atrial fibrillation: Secondary | ICD-10-CM | POA: Diagnosis not present

## 2019-04-05 DIAGNOSIS — I5033 Acute on chronic diastolic (congestive) heart failure: Secondary | ICD-10-CM | POA: Insufficient documentation

## 2019-04-05 DIAGNOSIS — J90 Pleural effusion, not elsewhere classified: Secondary | ICD-10-CM

## 2019-04-05 DIAGNOSIS — I5032 Chronic diastolic (congestive) heart failure: Secondary | ICD-10-CM

## 2019-04-05 NOTE — Progress Notes (Signed)
[facility]  MULTIDISCIPLINARY HEART VALVE CLINIC  CARDIOTHORACIC SURGERY CONSULTATION REPORT  Referring Provider is Bensimhon, Shaune Pascal, MD PCP is Ethelene Hal Mortimer Fries, MD  Chief Complaint  Patient presents with  . Aortic Stenosis    TAVR EVAL  . 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  Patient states that he has known presence of a heart murmur for many years.  His cardiac history dates back more than 10 years ago when he was first found to have atrial flutter.  He underwent atrial flutter ablation by Dr. Lovena Le and initially did well but subsequently developed atrial fibrillation.  He was treated for Tikosyn for a period of time but developed recurrent atrial fibrillation which has remained persistent.  He has been followed for many years by Dr. Haroldine Laws and previous echocardiograms have documented the presence of normal left ventricular systolic function and aortic stenosis which has gradually progressed in severity.  Patient remains remarkably active until approximately 1 year ago when he was initially diagnosed with diabetes and subsequently found to have CML.  He has been treated with chemotherapy.  Over the past year he developed gradual worsening of exertional shortness of breath.  Echocardiogram performed January 20, 2019 revealed severe aortic stenosis with preserved left ventricular systolic function.   Peak velocity across aortic valve was reported close to 4.0 m/s with mean transvalvular gradient estimated 41.5 mmHg.  The DVI was notably quite low at 0.20.  Over the last several months his progressed substantially until early December when he was seen in the emergency department with acute exacerbation of chronic CHF with resting shortness of breath and orthopnea.  He was given intravenous Lasix with excellent response and has been followed carefully ever since by Dr. Haroldine Laws.  Symptoms of congestive heart failure have improved considerably.  Diagnostic cardiac catheterization was performed March 18, 2019 and notable for mild nonobstructive coronary artery disease.  Catheterization confirmed the presence of aortic stenosis with peak to peak and mean transvalvular gradients measured 51 and 36 mmHg, respectively.  Right heart pressures were mildly elevated.  The patient was referred to the multidisciplinary heart valve clinic and has been evaluated previously by Dr. Burt Knack.  CT angiography was performed and the patient referred for surgical consultation.  Patient is married and lives locally in [name] with his wife.  They have 2 adult children.  He has been retired for more than 20 years having spent his career working at [employer].  He has remained physically active throughout retirement and done remarkably well until the past year or so.  Following his diagnosis with CML he developed worsening shortness of breath.  He also suffered a ruptured biceps tendon and has developed a tear in his right rotator cuff.  Symptoms of shortness of breath progressed until several weeks ago when he was seen briefly in the emergency department with resting shortness of breath and orthopnea.  He has done much better on diuretic therapy and currently he states that he only gets short of breath with more strenuous exertion.  He denies any  chest pain or chest tightness either with activity or at rest.  He has not had  palpitations or dizzy spells.  He had some lower extremity edema that has resolved.  He now can lay flat in bed.  His physical mobility is somewhat limited with degenerative arthritis in both knees as well as some poor balance.  He ambulates using a cane for stability.  He has not had any mechanical falls.  Past Medical History:  Diagnosis Date  . Anemia 06/04/2015  . Anxiety   . Aortic stenosis   . Atrial flutter ([place])    s/p ablation 07/17/08  . Chronic diastolic (congestive) heart failure ([place])   . Chronic diastolic CHF (congestive heart failure) ([place])   . Coronary artery disease    "slight"  . Diabetes mellitus type 2 in obese ([place]) 04/10/2009   Qualifier: Diagnosis of  By: Arnoldo Morale MD, Balinda Quails   . Emphysema of lung ([place])   . GERD (gastroesophageal reflux disease)   . H/O hiatal hernia   . Hepatitis    h/o of type A  . History of measles as a child   . History of mumps as a child   . Hypertension   . Hypothyroidism   . Micturition syncope   . Osteoarthritis   . Pleural effusion, bilateral   . Severe aortic stenosis   . Sleep apnea   . Vitamin D deficiency 11/24/2015    Past Surgical History:  Procedure Laterality Date  . CARDIAC CATHETERIZATION    . CARDIAC ELECTROPHYSIOLOGY MAPPING AND ABLATION  06/2008  . CATARACT EXTRACTION W/ INTRAOCULAR LENS  IMPLANT, BILATERAL  ~ 2007  . COLONOSCOPY    . FOOT TENDON SURGERY Left   . RIGHT/LEFT HEART CATH AND CORONARY ANGIOGRAPHY N/A 03/18/2019   Procedure: RIGHT/LEFT HEART CATH AND CORONARY ANGIOGRAPHY;  Surgeon: Jolaine Artist, MD;  Location: [facility];  Service: Cardiovascular;  Laterality: N/A;  . TONSILLECTOMY  1940's  . TOTAL KNEE ARTHROPLASTY  1990's   right  . TOTAL KNEE ARTHROPLASTY Left 08/02/2013   Procedure: LEFT TOTAL KNEE ARTHROPLASTY;  Surgeon: Gearlean Alf, MD;  Location: [facility];  Service: Orthopedics;  Laterality: Left;  . WISDOM TOOTH EXTRACTION      Family History  Problem Relation Age of Onset  .  Multiple sclerosis Father   . Heart disease Mother        atrial fibrillation  . Hypertension Son   . Heart disease Maternal Grandfather     Social History   Socioeconomic History  . Marital status: Married    Spouse name: Not on file  . Number of children: Not on file  . Years of education: Not on file  . Highest education level: Not on file  Occupational History  . Not on file  Tobacco Use  . Smoking status: Former Smoker    Years: 0.00    Types: Pipe, Cigars    Quit date: 04/01/1996    Years since quitting: 23.0  . Smokeless tobacco: Former Systems developer    Types: [place] date: 04/01/2009  Substance and Sexual Activity  . Alcohol use: Not Currently    Alcohol/week: 0.0 standard drinks    Comment: none in a year  . Drug use: No  . Sexual activity: Not Currently    Comment: lives with wife, eats clean diet. No major dietary restrtictions,   Other Topics Concern  . Not on file  Social History Narrative  . Not on file  Social Determinants of Health   Financial Resource Strain:   . Difficulty of Paying Living Expenses: Not on file  Food Insecurity:   . Worried About Charity fundraiser in the Last Year: Not on file  . Ran Out of Food in the Last Year: Not on file  Transportation Needs:   . Lack of Transportation (Medical): Not on file  . Lack of Transportation (Non-Medical): Not on file  Physical Activity:   . Days of Exercise per Week: Not on file  . Minutes of Exercise per Session: Not on file  Stress:   . Feeling of Stress : Not on file  Social Connections:   . Frequency of Communication with Friends and Family: Not on file  . Frequency of Social Gatherings with Friends and Family: Not on file  . Attends Religious Services: Not on file  . Active Member of Clubs or Organizations: Not on file  . Attends Archivist Meetings: Not on file  . Marital Status: Not on file  Intimate Partner Violence:   . Fear of Current or Ex-Partner: Not on file  .  Emotionally Abused: Not on file  . Physically Abused: Not on file  . Sexually Abused: Not on file    Current Outpatient Medications  Medication Sig Dispense Refill  . Biotin 10000 MCG TABS Take 10,000 mcg by mouth daily.     [name] CINNAMON PO Take 2,000 mg by mouth daily.     . Coenzyme Q10 ([employer] COQ-10) 200 MG capsule Take 200 mg by mouth daily.    . dasatinib (SPRYCEL) 100 MG tablet Take 100 mg by mouth daily.    [name] ELIQUIS 5 MG TABS tablet TAKE 1 TABLET BY MOUTH TWICE A DAY (Patient taking differently: Take 5 mg by mouth 2 (two) times daily. ) 180 tablet 3  . ergocalciferol (VITAMIN D2) 1.25 MG (50000 UT) capsule Take 50,000 Units by mouth every Monday.    . famotidine (PEPCID) 20 MG tablet Take 20 mg by mouth 2 (two) times daily before a meal.    . ferrous sulfate 325 (65 FE) MG tablet Take 325 mg by mouth 2 (two) times daily with a meal.    . finasteride (PROSCAR) 5 MG tablet Take 5 mg by mouth every evening.     [name] KLOR-CON M20 20 MEQ tablet TAKE 1 TABLET (20 MEQ TOTAL) BY MOUTH DAILY. PLEASE CALL FOR OFFICE VISIT [phone] (Patient taking differently: Take 20 mEq by mouth daily. ) 90 tablet 1  . levothyroxine (SYNTHROID) 200 MCG tablet Take 200 mcg by mouth daily before breakfast.    . metolazone (ZAROXOLYN) 2.5 MG tablet Take only as directed by CHF clinic (Patient taking differently: Take 2.5 mg by mouth daily as needed (swelling). ) 10 tablet 0  . metoprolol tartrate (LOPRESSOR) 50 MG tablet Take upon arrival to the hospital for your tests on 12/30. 1 tablet 0  . PARoxetine (PAXIL) 40 MG tablet Take 1 tablet (40 mg total) by mouth every morning. 90 tablet 1  . protein supplement shake (PREMIER PROTEIN) LIQD Take 2 oz by mouth daily.    . rosuvastatin (CRESTOR) 10 MG tablet Take 1 tablet (10 mg total) by mouth daily. 90 tablet 3  . spironolactone (ALDACTONE) 25 MG tablet Take 0.5 tablets (12.5 mg total) by mouth daily. 45 tablet 3  . traZODone (DESYREL) 50 MG tablet Take 50 mg by mouth  at bedtime.    . TURMERIC PO Take 1,000 mg by mouth daily.    [name]  fenofibrate (TRICOR) 145 MG tablet TAKE 1 TABLET BY MOUTH EVERY DAY (Patient not taking: Reported on 03/17/2019) 90 tablet 1  . torsemide (DEMADEX) 20 MG tablet Take 1 tablet (20 mg total) by mouth daily. May take and additional tab in PM (Patient not taking: Reported on 04/05/2019) 60 tablet 3   No current facility-administered medications for this visit.    Allergies  Allergen Reactions  . Ace Inhibitors Anaphylaxis and Swelling    Face and lip swelling  . Benazepril Anaphylaxis and Swelling    Face and lip swelling  . Codeine Itching and Rash  . Statins Other (See Comments)    Made very sick, doctor thought had heart attack; "my appearance, color, etc". Tolerating a low dose currently  . Piroxicam Hives  . Pseudoephedrine Other (See Comments)    Can't take because it interacts with dofetilide       Review of Systems:   General:  normal appetite, normal energy, no weight gain, + weight loss, no fever  Cardiac:  no chest pain with exertion, no chest pain at rest, +SOB with exertion, no resting SOB, no PND, + orthopnea, no palpitations, + arrhythmia, + atrial fibrillation, + LE edema, no dizzy spells, no syncope  Respiratory:  + shortness of breath, no home oxygen, no productive cough, no dry cough, no bronchitis, no wheezing, no hemoptysis, no asthma, no pain with inspiration or cough, + sleep apnea, + CPAP at night  GI:   no difficulty swallowing, no reflux, no frequent heartburn, no hiatal hernia, no abdominal pain, no constipation, no diarrhea, no hematochezia, no hematemesis, no melena  White:   no dysuria,  no frequency, no urinary tract infection, no hematuria, no enlarged prostate, no kidney stones, no kidney disease  Vascular:  no pain suggestive of claudication, no pain in feet, no leg cramps, no varicose veins, no DVT, no non-healing foot ulcer  Neuro:   no stroke, no TIA's, no seizures, no headaches, no temporary  blindness one eye,  no slurred speech, no peripheral neuropathy, no chronic pain, mild instability of gait, no memory/cognitive dysfunction  Musculoskeletal: + arthritis, + joint swelling, no myalgias, minor difficulty walking, slightly decreased mobility   Skin:   no rash, no itching, no skin infections, no pressure sores or ulcerations  Psych:   no anxiety, no depression, no nervousness, no unusual recent stress  Eyes:   no blurry vision, no floaters, no recent vision changes, does not wears glasses or contacts  ENT:   no hearing loss, no loose or painful teeth, no dentures, last saw dentist 2 years ago  Hematologic:  no easy bruising, no abnormal bleeding, no clotting disorder, no frequent epistaxis  Endocrine:  + diabetes, does not check CBG's at home           Physical Exam:   BP (!) 152/85 (BP Location: Right Arm, Patient Position: Sitting, Cuff Size: Normal)   Pulse 92   Temp (!) 97.5 F (36.4 C)   Resp 16   Ht 6\' 2"  (1.88 m)   Wt 215 lb 3.2 oz (97.6 kg)   SpO2 96% Comment: RA  BMI 27.63 kg/m   General:  Elderly,  well-appearing  HEENT:  Unremarkable   Neck:   no JVD, no bruits, no adenopathy   Chest:   clear to auscultation, symmetrical breath sounds but diminished at bases, no wheezes, no rhonchi   CV:   Irregular rate and rhythm, grade III/VI crescendo/decrescendo murmur heard best at RLSB,  no diastolic  murmur  Abdomen:  soft, non-tender, no masses   Extremities:  warm, well-perfused, pulses diminished, no LE edema  Rectal/White  Deferred  Neuro:   Grossly non-focal and symmetrical throughout  Skin:   Clean and dry, no rashes, no breakdown   Diagnostic Tests:    ECHOCARDIOGRAM REPORT       Patient Name:   RAJVIR BYRDSONG Date of Exam: 01/20/2019 Medical Rec #:  [id number]     Height:       75.0 in Accession #:    [id number]    Weight:       237.4 lb Date of Birth:  28-Jun-1935     BSA:          2.36 m Patient Age:    11 years      BP:           160/90  mmHg Patient Gender: M             HR:           96 bpm. Exam Location:  Outpatient  Procedure: 2D Echo, Cardiac Doppler, Color Doppler and Strain Analysis  Indications:    R06.02 SOB   History:        Patient has prior history of Echocardiogram examinations, most                 recent 08/28/2018. Abnormal ECG, Aortic Valve Disease;                 Arrythmias:Atrial Flutter and Atrial Fibrillation                 Signs/Symptoms:Murmur and Dyspnea Risk Factors:Hypertension,                 Diabetes, Dyslipidemia and Sleep Apnea.   Sonographer:    Roseanna Rainbow Referring Phys: [id number] DANIEL R BENSIMHON  IMPRESSIONS    1. Left ventricular ejection fraction, by visual estimation, is 55 to 60%. The left ventricle has normal function. Normal left ventricular size. There is borderline left ventricular hypertrophy.  2. Left ventricular diastolic function could not be evaluated pattern of LV diastolic filling.  3. Global right ventricle has mildly reduced systolic function.The right ventricular size is normal. No increase in right ventricular wall thickness.  4. Left atrial size was severely dilated.  5. Right atrial size was mild-moderately dilated.  6. Large pleural effusion in the left lateral region.  7. Small pericardial effusion.  8. The pericardial effusion is circumferential.  9. Mild mitral annular calcification. 10. The mitral valve is normal in structure. Mild to moderate mitral valve regurgitation. 11. The tricuspid valve is normal in structure. Tricuspid valve regurgitation mild-moderate. 12. The aortic valve is tricuspid Aortic valve regurgitation was not visualized by color flow Doppler. Severe aortic valve stenosis. 13. The pulmonic valve was grossly normal. Pulmonic valve regurgitation is not visualized by color flow Doppler. 14. Moderately elevated pulmonary artery systolic pressure.  FINDINGS  Left Ventricle: Left ventricular ejection fraction, by visual estimation, is  55 to 60%. The left ventricle has normal function. No evidence of left ventricular regional wall motion abnormalities. There is borderline left ventricular hypertrophy.  Concentric left ventricular hypertrophy. Normal left ventricular size. The left ventricular diastology could not be evaluatedsecondary to atrial fibrillation. Spectral Doppler shows Left ventricular diastolic function could not be evaluated pattern of LV  diastolic filling.  Right Ventricle: The right ventricular size is normal. No increase in right ventricular wall thickness. Global RV systolic function is  has mildly reduced systolic function. The tricuspid regurgitant velocity is 3.32 m/s, and with an assumed right atrial  pressure of 8 mmHg, the estimated right ventricular systolic pressure is moderately elevated at 52.0 mmHg.  Left Atrium: Left atrial size was severely dilated.  Right Atrium: Right atrial size was mild-moderately dilated  Pericardium: A small pericardial effusion is present. The pericardial effusion is circumferential. There is a large pleural effusion in the left lateral region.  Mitral Valve: The mitral valve is normal in structure. Mild mitral annular calcification. Mild to moderate mitral valve regurgitation, with centrally-directed jet.  Tricuspid Valve: The tricuspid valve is normal in structure. Tricuspid valve regurgitation mild-moderate by color flow Doppler.  Aortic Valve: The aortic valve is tricuspid. Aortic valve regurgitation was not visualized by color flow Doppler. Severe aortic stenosis is present. Aortic valve mean gradient measures 41.5 mmHg. Aortic valve peak gradient measures 62.4 mmHg. Aortic  valve area, by VTI measures 0.73 cm.  Pulmonic Valve: The pulmonic valve was grossly normal. Pulmonic valve regurgitation is not visualized by color flow Doppler.  Aorta: The aortic root and ascending aorta are structurally normal, with no evidence of dilitation.  IAS/Shunts: No  atrial level shunt detected by color flow Doppler.     LEFT VENTRICLE PLAX 2D LVIDd:         4.60 cm LVIDs:         3.20 cm LV PW:         1.30 cm LV IVS:        1.10 cm LVOT diam:     2.15 cm LV SV:         56 ml LV SV Index:   23.41 LVOT Area:     3.63 cm   LV Volumes (MOD) LV area d, A2C:    26.40 cm LV area d, A4C:    26.50 cm LV area s, A2C:    17.50 cm LV area s, A4C:    13.10 cm LV major d, A2C:   8.03 cm LV major d, A4C:   7.30 cm LV major s, A2C:   7.20 cm LV major s, A4C:   5.95 cm LV vol d, MOD A2C: 70.1 ml LV vol d, MOD A4C: 79.1 ml LV vol s, MOD A2C: 35.3 ml LV vol s, MOD A4C: 23.9 ml LV SV MOD A2C:     34.8 ml LV SV MOD A4C:     79.1 ml LV SV MOD BP:      46.1 ml  RIGHT VENTRICLE TAPSE (M-mode): 1.1 cm  LEFT ATRIUM             Index       RIGHT ATRIUM           Index LA diam:        5.60 cm 2.37 cm/m  RA Area:     23.20 cm LA Vol (A2C):   69.0 ml 29.23 ml/m RA Volume:   72.90 ml  30.89 ml/m LA Vol (A4C):   91.0 ml 38.56 ml/m LA Biplane Vol: 84.3 ml 35.72 ml/m  AORTIC VALVE AV Area (Vmax):    0.87 cm AV Area (Vmean):   0.75 cm AV Area (VTI):     0.73 cm AV Vmax:           395.00 cm/s AV Vmean:          312.000 cm/s AV VTI:            0.950 m AV Peak Grad:  62.4 mmHg AV Mean Grad:      41.5 mmHg LVOT Vmax:         95.00 cm/s LVOT Vmean:        64.300 cm/s LVOT VTI:          0.192 m LVOT/AV VTI ratio: 0.20   AORTA Ao Root diam: 3.30 cm Ao Asc diam:  3.30 cm  MITRAL VALVE                 TRICUSPID VALVE MV Area (PHT): 3.65 cm      TR Peak grad:   44.0 mmHg MV PHT:        60.32 msec    TR Vmax:        334.00 cm/s MV Decel Time: 208 msec MR Peak grad:    164.9 mmHg  SHUNTS MR Mean grad:    105.0 mmHg  Systemic VTI:  0.19 m MR Vmax:         642.00 cm/s Systemic Diam: 2.15 cm MR Vmean:        480.0 cm/s MR PISA:         1.57 cm MR PISA Eff ROA: 9 mm MR PISA Radius:  0.50 cm MV E velocity: 113.00 cm/s    [name]  Croitoru MD Electronically signed by Sanda Klein MD Signature Date/Time: 01/20/2019/2:37:58 PM     RIGHT/LEFT HEART CATH AND CORONARY ANGIOGRAPHY  Conclusion    Prox RCA to Mid RCA lesion is 30% stenosed.  RPAV lesion is 30% stenosed.  Ost Cx to Prox Cx lesion is 50% stenosed.  Prox LAD to Mid LAD lesion is 20% stenosed.   Findings:  Ao = 82/51(65)  Lv = 138/8   RA = 5 RV = 38/6 PA = 37/13 (22) PCW = 14  Fick cardiac output/index = 9.8/4.3 PVR = 0.7 WU FA sat = 96% PA sat = 78%, 77% High SVC 75%  AoV = peak gradient 29mm HG, mean gradient 36 mmHG, AVA 1.4 cm2  Assessment:  1. Mild non-obstructive CAD 2. Moderate to severe AS 3.  Normal LV function by echo  Plan/Discussion:  Refer for TAVR eval.   [name] Bickers, MD  3:17 PM     Indications  Nonrheumatic aortic valve stenosis [I35.0 (ICD-10-CM)]  Procedural Details  Technical Details The risks and indication of the procedure were explained. Consent was signed and placed on the chart. An appropriate timeout was taken prior to the procedure.   The right AC fossa was prepped and draped in the routine sterile fashion and anesthetized with 1% local lidocaine. The pre-existing PIV in the right [employer] was exchanged over a wire for a 5 FR venous sheath using a modified Seldinger technique. A standard Swan-Ganz catheter was used for the procedure.   After a normal Allen's test was confirmed, the right wrist was prepped and draped in the routine sterile fashion and anesthetized with 1% local lidocaine. A 5 FR arterial sheath was then placed in the right radial artery using a modified Seldinger technique. 3mg  IV verapamil was given through the sheath. Systemic heparin was administered.  Standard catheters including a JL 3.5, AL-1 were used. All catheter exchanges were made over a wire. The AoV was difficult to cross. We crossed with an AL-1 and a straight wire.  Estimated blood loss <50 mL.   During this  procedure medications were administered to achieve and maintain moderate conscious sedation while the patient's heart rate, blood pressure, and oxygen saturation were continuously monitored and  I was present face-to-face 100% of this time.  Medications (Filter: Administrations occurring from 03/18/19 1408 to 03/18/19 1512) (important)  Continuous medications are totaled by the amount administered until 03/18/19 1512.  fentaNYL (SUBLIMAZE) injection (mcg) Total dose:  25 mcg Date/Time  Rate/Dose/Volume Action  03/18/19 1431  25 mcg Given    midazolam (VERSED) injection (mg) Total dose:  2 mg Date/Time  Rate/Dose/Volume Action  03/18/19 1431  2 mg Given    lidocaine (PF) (XYLOCAINE) 1 % injection (mL) Total volume:  4 mL Date/Time  Rate/Dose/Volume Action  03/18/19 1431  2 mL Given  1432  2 mL Given    Heparin (Porcine) in NaCl 1000-0.9 UT/500ML-% SOLN (mL) Total volume:  1,000 mL Date/Time  Rate/Dose/Volume Action  03/18/19 1432  500 mL Given  1432  500 mL Given    Radial Cocktail/Verapamil only (mL) Total volume:  10 mL Date/Time  Rate/Dose/Volume Action  03/18/19 1433  10 mL Given    heparin injection (Units) Total dose:  5,000 Units Date/Time  Rate/Dose/Volume Action  03/18/19 1444  5,000 Units Given    iohexol (OMNIPAQUE) 350 MG/ML injection (mL) Total volume:  50 mL Date/Time  Rate/Dose/Volume Action  03/18/19 1501  50 mL Given    Sedation Time  Sedation Time Physician-1: 31 minutes 6 seconds  Contrast  Medication Name Total Dose  iohexol (OMNIPAQUE) 350 MG/ML injection 50 mL    Radiation/Fluoro  Fluoro time: 13.7 (min) DAP: [id number] (mGycm2) Cumulative Air Kerma: 487 (mGy)  Coronary Findings  Diagnostic Dominance: Right Left Anterior Descending  Prox LAD to Mid LAD lesion 20% stenosed  Prox LAD to Mid LAD lesion is 20% stenosed.  Left Circumflex  Ost Cx to Prox Cx lesion 50% stenosed  Ost Cx to Prox Cx lesion is 50% stenosed.  Right Coronary Artery   Prox RCA to Mid RCA lesion 30% stenosed  Prox RCA to Mid RCA lesion is 30% stenosed.  Right Posterior Atrioventricular Artery  RPAV lesion 30% stenosed  RPAV lesion is 30% stenosed.  Intervention  No interventions have been documented. Coronary Diagrams  Diagnostic Dominance: Right  Intervention  Implants   No implant documentation for this case.  Syngo Images  Show images for CARDIAC CATHETERIZATION  Images on Long Term Storage  Show images for Wine Aben, [place] to Procedure Log  Procedure Log    Hemo Data   Most Recent Value  Fick Cardiac Output 9.76 L/min  Fick Cardiac Output Index 4.35 (L/min)/BSA  Aortic Mean Gradient 35.53 mmHg  Aortic Peak Gradient 51 mmHg  Aortic Valve Area 1.46  Aortic Value Area Index 0.65 cm2/BSA  RA A Wave 7 mmHg  RA V Wave 7 mmHg  RA Mean 5 mmHg  RV Systolic Pressure 36 mmHg  RV Diastolic Pressure 0 mmHg  RV EDP 6 mmHg  PA Systolic Pressure 37 mmHg  PA Diastolic Pressure 13 mmHg  PA Mean 22 mmHg  PW A Wave -99 mmHg  PW V Wave 24 mmHg  PW Mean 14 mmHg  AO Systolic Pressure 82 mmHg  AO Diastolic Pressure 51 mmHg  AO Mean 65 mmHg  LV Systolic Pressure 0000000 mmHg  LV Diastolic Pressure 3 mmHg  LV EDP 7 mmHg  AOp Systolic Pressure 87 mmHg  AOp Diastolic Pressure 53 mmHg  AOp Mean Pressure 68 mmHg  LVp Systolic Pressure 0000000 mmHg  LVp Diastolic Pressure 3 mmHg  LVp EDP Pressure 9 mmHg  QP/QS 1  TPVR Index 5.06 HRUI  TSVR  Index 14.95 HRUI  PVR SVR Ratio 0.13  TPVR/TSVR Ratio 0.34     Cardiac TAVR CT  TECHNIQUE: The patient was scanned on a [employer]. A 120 kV retrospective scan was triggered in the descending thoracic aorta at 111 HU's. Gantry rotation speed was 250 msecs and collimation was .6 mm. No beta blockade or nitro were given. The 3D data set was reconstructed in 5% intervals of the R-R cycle. Systolic and diastolic phases were analyzed on a dedicated work station using MPR, MIP and VRT  modes. The patient received 80 cc of contrast.  FINDINGS: Aortic Valve: Trileaflet aortic valve with severely thickened and calcified leaflets and no calcifications extending into the LVOT.  Aorta: Mild dilatation of the ascending aorta measuring 44 x 43 mm. Mild diffuse atherosclerotic plaque and calcifications. No dissection.  Sinotubular Junction: 31 x 30 mm  Ascending Thoracic Aorta: 36 x 36 mm  Aortic Arch: 28 x 28 mm  Descending Thoracic Aorta: 28 x 28 mm  Sinus of Valsalva Measurements:  Non-coronary: 35 mm  Right -coronary: 33 mm  Left -coronary: 36 mm  Coronary Artery Height above Annulus:  Left Main: 14 mm  Right Coronary: 18 mm  Virtual Basal Annulus Measurements:  Maximum/Minimum Diameter: 27.8 x 24.7 mm  Mean Diameter: 25.4 mm  Perimeter: 81.2 mm  Area: 507 mm2  Optimum Fluoroscopic Angle for Delivery: LAO 5 CAU 5  IMPRESSION: 1. Trileaflet aortic valve with severely thickened and calcified leaflets and no calcifications extending into the LVOT. Aortic valve calcium score is 2881 consistent with severe aortic stenosis. Annular measurements are suitable for delivery of a 26 mm Edwards-SAPIEN 3 Ultra valve.  2. Sufficient coronary to annulus distance.  3. Optimum Fluoroscopic Angle for Delivery: LAO 5 CAU 5  4. There is a small filling defect in the most superior portion of the left atrial appendage that gets smaller on the delayed images and most probably represents poor contrast mixing.   Electronically Signed   By: [name] White   On: 04/01/2019 22:09    CT ANGIOGRAPHY CHEST, ABDOMEN AND PELVIS  TECHNIQUE: Multidetector CT imaging through the chest, abdomen and pelvis was performed using the standard protocol during bolus administration of intravenous contrast. Multiplanar reconstructed images and MIPs were obtained and reviewed to evaluate the vascular anatomy.  CONTRAST:  168mL OMNIPAQUE IOHEXOL  350 MG/ML SOLN  COMPARISON:  Cardiac CTA 08/13/2011.  FINDINGS: CTA CHEST FINDINGS  Cardiovascular: Heart size is mildly enlarged with left atrial dilatation. There is no significant pericardial fluid, thickening or pericardial calcification. Tiny filling defect in the tip of the left atrial appendage, concerning for potential left atrial appendage thrombus. There is aortic atherosclerosis, as well as atherosclerosis of the great vessels of the mediastinum and the coronary arteries, including calcified atherosclerotic plaque in the left anterior descending, left circumflex and right coronary arteries. Severe thickening calcification of the aortic valve.  Mediastinum/Lymph Nodes: No pathologically enlarged mediastinal or hilar lymph nodes. Esophagus is unremarkable in appearance. No axillary lymphadenopathy.  Lungs/Pleura: Moderate to large bilateral pleural effusions lying dependently with associated areas of passive subsegmental atelectasis in the lower lobes of the lungs bilaterally. No acute consolidative airspace disease. No suspicious appearing pulmonary nodules or masses are noted.  Musculoskeletal/Soft Tissues: There are no aggressive appearing lytic or blastic lesions noted in the visualized portions of the skeleton.  CTA ABDOMEN AND PELVIS FINDINGS  Hepatobiliary: No suspicious cystic or solid hepatic lesions. No intra or extrahepatic biliary ductal dilatation. Gallbladder is  unremarkable in appearance.  Pancreas: No pancreatic mass. No pancreatic ductal dilatation. No pancreatic or peripancreatic fluid collections or inflammatory changes.  Spleen: Well-defined 2.7 cm intermediate attenuation lesion in the spleen, incompletely characterized, but favored to represent a benign lesion.  Adrenals/Urinary Tract: Subcentimeter low-attenuation lesions in the left kidney, too small to characterize, but statistically likely to represent tiny cysts. Right  kidney and bilateral adrenal glands are normal in appearance. No hydroureteronephrosis. Urinary bladder is normal in appearance.  Stomach/Bowel: Normal appearance of the stomach. No pathologic dilatation of small bowel or colon. A few scattered colonic diverticulae are noted, without surrounding inflammatory changes to suggest an acute diverticulitis at this time. The appendix is not confidently identified and may be surgically absent. Regardless, there are no inflammatory changes noted adjacent to the cecum to suggest the presence of an acute appendicitis at this time.  Vascular/Lymphatic: Aortic atherosclerosis, without evidence of aneurysm or dissection in the abdominal or pelvic vasculature. Vascular findings and measurements pertinent to potential TAVR procedure, as detailed below. Retroaortic left renal vein (normal anatomical variant) incidentally noted. No lymphadenopathy noted in the abdomen or pelvis.  Reproductive: Prostate gland is enlarged. Seminal vesicles are unremarkable in appearance.  Other: No significant volume of ascites.  No pneumoperitoneum.  Musculoskeletal: There are no aggressive appearing lytic or blastic lesions noted in the visualized portions of the skeleton.  VASCULAR MEASUREMENTS PERTINENT TO TAVR:  AORTA:  Minimal Aortic Diameter-8 x 15 mm  Severity of Aortic Calcification-severe  RIGHT PELVIS:  Right Common Iliac Artery -  Minimal Diameter-10.4 x 9.3 mm  Tortuosity-mild  Calcification - mild  Right External Iliac Artery -  Minimal Diameter-8.9 x 8.0 mm  Tortuosity-moderate to severe  Calcification - mild  Right Common Femoral Artery -  Minimal Diameter-9.3 x 7.7 mm  Tortuosity-mild  Calcification - mild  LEFT PELVIS:  Left Common Iliac Artery -  Minimal Diameter-9.9 x 8.7 mm  Tortuosity-mild  Calcification - mild  Left External Iliac Artery -  Minimal Diameter-8.6 x 8.0  mm  Tortuosity-moderate  Calcification-none  Left Common Femoral Artery -  Minimal Diameter-8.7 x 8.3 mm  Tortuosity-mild  Calcification - mild  Review of the MIP images confirms the above findings.  IMPRESSION: 1. Vascular findings and measurements pertinent to potential TAVR procedure, as detailed above. 2. Severe thickening calcification of the aortic valve, compatible with the reported clinical history of severe aortic stenosis. 3. Filling defect in the tip of the left atrial appendage. Given the cardiomegaly with left atrial dilatation, this is concerning for left atrial appendage thrombus, but could simply represent "pseudo thrombus" related to incomplete mixing of contrast material. Attention at time of forthcoming transesophageal echocardiography is suggested. 4. Aortic atherosclerosis, in addition to 3 vessel coronary artery disease. 5. Colonic diverticulosis without evidence of acute diverticulitis at this time. 6. Additional incidental findings, as above.   Electronically Signed   By: [name] White M.D.   On: 03/31/2019 13:59      EKG: Atrial fibrillation with slight IVCD - QRS duration 106 ms    Impression:  Patient has stage D severe symptomatic aortic stenosis.  Over the past year he has developed progressive symptoms of exertional shortness of breath, fatigue, orthopnea, and fluid retention consistent with chronic diastolic congestive heart failure.  He was seen in the emergency department recently with acute exacerbation of symptoms with minimal activity consistent with New York Heart Association functional class IIIb.  However, with increased diuretic therapy he is doing much better, currently experiencing symptoms  only with moderate level activity, New York Heart Association functional class II.  I have personally reviewed the patient's recent transthoracic echocardiogram, diagnostic cardiac catheterization, and CT angiograms.   Transthoracic echocardiogram demonstrates severe aortic stenosis with preserved left ventricular systolic function.  The aortic valve is trileaflet with severe thickening, calcification, and restricted leaflet mobility involving all 3 leaflets.  Peak velocity across aortic valve measured close to 4.0 m/s corresponding to mean transvalvular gradient estimated greater than 41 mmHg.  DVI was notably quite low at 0.20.  Diagnostic cardiac catheterization confirmed the presence of aortic stenosis and revealed moderate nonobstructive coronary artery disease.  Right heart pressures were mildly elevated.  Cardiac-gated CTA of the heart reveals anatomical characteristics consistent with aortic stenosis suitable for treatment by transcatheter aortic valve replacement without any significant complicating features and CTA of the aorta and iliac vessels demonstrate what appears to be adequate pelvic vascular access to facilitate a transfemoral approach.  CT scan of the chest also demonstrates the presence of bilateral pleural effusions that are fairly sizable, right greater than left.  Baseline twelve-lead EKG demonstrates atrial fibrillation with slightly prolonged QRS consistent with mild intraventricular conduction delay.  I agree the patient needs aortic valve replacement.  Risks associated with conventional surgery would be somewhat elevated because of the patient's advanced age and comorbid medical problems.    Plan:  The patient was counseled at length regarding treatment alternatives for management of severe symptomatic aortic stenosis. Alternative approaches such as conventional aortic valve replacement, transcatheter aortic valve replacement, and continued medical therapy without intervention were compared and contrasted at length.  The risks associated with conventional surgical aortic valve replacement were discussed in detail, as were expectations for post-operative convalescence.  Issues specific to  transcatheter aortic valve replacement were discussed including questions about long term valve durability, the potential for paravalvular leak, possible increased risk of need for permanent pacemaker placement, and other technical complications related to the procedure itself.  Long-term prognosis with medical therapy was discussed. This discussion was placed in the context of the patient's own specific clinical presentation and past medical history.  All of his questions have been addressed.  The patient desires to proceed with transcatheter aortic valve replacement as soon as practical.  We also discussed the presence of bilateral pleural effusions.  Because the patient's respiratory status is currently much improved, he favors waiting to see if his effusions will resolve following transcatheter aortic valve replacement rather than considering thoracentesis in the near future.    Following the decision to proceed with transcatheter aortic valve replacement, a discussion has been held regarding what types of management strategies would be attempted intraoperatively in the event of life-threatening complications, including whether or not the patient would be considered a candidate for the use of cardiopulmonary bypass and/or conversion to open sternotomy for attempted surgical intervention.  The patient specifically requests that should a potentially life-threatening complication develop we would attempt emergency median sternotomy and/or other aggressive surgical procedures.  The patient has been advised of a variety of complications that might develop including but not limited to risks of death, stroke, paravalvular leak, aortic dissection or other major vascular complications, aortic annulus rupture, device embolization, cardiac rupture or perforation, mitral regurgitation, acute myocardial infarction, arrhythmia, heart block or bradycardia requiring permanent pacemaker placement, congestive heart failure,  respiratory failure, renal failure, pneumonia, infection, other late complications related to structural valve deterioration or migration, or other complications that might ultimately cause a temporary or permanent loss of functional independence  or other long term morbidity.  The patient provides full informed consent for the procedure as described and all questions were answered.  We tentatively plan to proceed with transcatheter aortic valve replacement via percutaneous transfemoral approach on April 13, 2019.  The patient has been instructed to stop taking Eliquis prior to surgery after he takes his dose on Thursday, April 08, 2019.  I spent in excess of 90 minutes during the conduct of this office consultation and >50% of this time involved direct face-to-face encounter with the patient for counseling and/or coordination of their care.    [name] White. [name] Manns, MD 04/05/2019 3:22 PM

## 2019-04-05 NOTE — Patient Instructions (Signed)
Stop taking Eliquis after you take your medications on Thursday 04/08/2019  Continue taking all other medications without change through the day before surgery.  Make sure to bring all of your medications with you when you come for your Pre-Admission Testing appointment at [facility] Short-Stay Department.  Have nothing to eat or drink after midnight the night before surgery.  On the morning of surgery take only Synthroid with a sip of water.

## 2019-04-06 ENCOUNTER — Other Ambulatory Visit: Payer: Self-pay

## 2019-04-06 DIAGNOSIS — I350 Nonrheumatic aortic (valve) stenosis: Secondary | ICD-10-CM

## 2019-04-08 NOTE — Progress Notes (Signed)
[id number] [address], [place] [address] Phone: [phone] Fax: [place], [address] [place] [address] Phone: [phone] Fax: [place], [address] [place] [address] Phone: [phone] Fax: [phone]      Your procedure is scheduled on April 13, 2019.  Report to [facility] Main Entrance "A" at 8:45 A.M., and check in at the Admitting office.  Call this number if you have problems the morning of surgery:  [phone]  Call [phone] if you have any questions prior to your surgery date Monday-Friday 8am-4pm    Remember:  Do not eat or drink after midnight the night before your surgery   Take these medicines the morning of surgery with A SIP OF WATER: levothyroxine (SYNTHROID)  As of today, STOP taking any Aspirin (unless otherwise instructed by your surgeon), Aleve, Naproxen, Ibuprofen, Motrin, Advil, Goody's, BC's, all herbal medications, fish oil, and all vitamins.  Your last day of Eliquis is April 08, 2019.     The Morning of Surgery  Do not wear jewelry.  Do not wear lotions, powders, colognes, or deodorant    Men may shave face and neck.  Do not bring valuables to the hospital.  [facility] is not responsible for any belongings or valuables.  If you are a smoker, DO NOT Smoke 24 hours prior to surgery  If you wear a CPAP at night please bring your mask, tubing, and machine the morning of surgery   Remember that you must have someone to transport you home after your surgery, and remain with you for 24 hours if you are discharged the same day.   Please bring cases for contacts, glasses, hearing aids, dentures or bridgework because it cannot be worn into surgery.    Leave your suitcase in the car.   After surgery it may be brought to your room.  For patients admitted to the hospital, discharge time will be determined by your treatment team.  Patients discharged the day of surgery will not be allowed to drive home.    Special instructions:   [facility]- Preparing For Surgery  Before surgery, you can play an important role. Because skin is not sterile, your skin needs to be as free of germs as possible. You can reduce the number of germs on your skin by washing with CHG (chlorahexidine gluconate) Soap before surgery.  CHG is an antiseptic cleaner which kills germs and bonds with the skin to continue killing germs even after washing.    Oral Hygiene is also important to reduce your risk of infection.  Remember - BRUSH YOUR TEETH THE MORNING OF SURGERY WITH YOUR REGULAR TOOTHPASTE  Please do not use if you have an allergy to CHG or antibacterial soaps. If your skin becomes reddened/irritated stop using the CHG.  Do not shave (including legs and underarms) for at least 48 hours prior to first CHG shower. It is OK to shave your face.  Please follow these instructions carefully.   1. Shower the NIGHT BEFORE SURGERY and the MORNING OF SURGERY with CHG Soap.   2. If you chose to wash your hair, wash your hair first as usual with your normal shampoo.  3. After you shampoo, rinse your hair and body thoroughly to remove the shampoo.  4. Use  CHG as you would any other liquid soap. You can apply CHG directly to the skin and wash gently with a scrungie or a clean washcloth.   5. Apply the CHG Soap to your body ONLY FROM THE NECK DOWN.  Do not use on open wounds or open sores. Avoid contact with your eyes, ears, mouth and genitals (private parts). Wash Face and genitals (private parts)  with your normal soap.   6. Wash thoroughly, paying special attention to the area where your surgery will be performed.  7. Thoroughly rinse your body with warm water from the neck down.  8. DO NOT shower/wash  with your normal soap after using and rinsing off the CHG Soap.  9. Pat yourself dry with a CLEAN TOWEL.  10. Wear CLEAN PAJAMAS to bed the night before surgery, wear comfortable clothes the morning of surgery  11. Place CLEAN SHEETS on your bed the night of your first shower and DO NOT SLEEP WITH PETS.    Day of Surgery:  Please shower the morning of surgery with the CHG soap Do not apply any deodorants/lotions. Please wear clean clothes to the hospital/surgery center.   Remember to brush your teeth WITH YOUR REGULAR TOOTHPASTE.   Please read over the following fact sheets that you were given.

## 2019-04-09 ENCOUNTER — Other Ambulatory Visit: Payer: Self-pay

## 2019-04-09 ENCOUNTER — Ambulatory Visit (HOSPITAL_COMMUNITY)
Admission: RE | Admit: 2019-04-09 | Discharge: 2019-04-09 | Disposition: A | Discharge status: Home / Self Care | Payer: Medicare Other | Source: Ambulatory Visit | Attending: Cardiovascular Disease | Admitting: Cardiovascular Disease

## 2019-04-09 ENCOUNTER — Encounter (HOSPITAL_COMMUNITY)
Admission: RE | Admit: 2019-04-09 | Discharge: 2019-04-09 | Disposition: A | Discharge status: Home / Self Care | Payer: Medicare Other | Source: Ambulatory Visit | Attending: Cardiovascular Disease | Admitting: Cardiovascular Disease

## 2019-04-09 ENCOUNTER — Encounter (HOSPITAL_COMMUNITY): Payer: Self-pay

## 2019-04-09 ENCOUNTER — Other Ambulatory Visit (HOSPITAL_COMMUNITY)
Admission: RE | Admit: 2019-04-09 | Discharge: 2019-04-09 | Disposition: A | Discharge status: Home / Self Care | Payer: Medicare Other | Source: Ambulatory Visit | Attending: Cardiovascular Disease | Admitting: Cardiovascular Disease

## 2019-04-09 DIAGNOSIS — R9431 Abnormal electrocardiogram [ECG] [EKG]: Secondary | ICD-10-CM | POA: Diagnosis not present

## 2019-04-09 DIAGNOSIS — I350 Nonrheumatic aortic (valve) stenosis: Secondary | ICD-10-CM | POA: Insufficient documentation

## 2019-04-09 DIAGNOSIS — Z01818 Encounter for other preprocedural examination: Secondary | ICD-10-CM | POA: Insufficient documentation

## 2019-04-09 DIAGNOSIS — J9811 Atelectasis: Secondary | ICD-10-CM | POA: Insufficient documentation

## 2019-04-09 DIAGNOSIS — J90 Pleural effusion, not elsewhere classified: Secondary | ICD-10-CM | POA: Diagnosis not present

## 2019-04-09 DIAGNOSIS — I4891 Unspecified atrial fibrillation: Secondary | ICD-10-CM | POA: Insufficient documentation

## 2019-04-09 DIAGNOSIS — Z20822 Contact with and (suspected) exposure to covid-19: Secondary | ICD-10-CM | POA: Insufficient documentation

## 2019-04-09 HISTORY — DX: Depression, unspecified: F32.A

## 2019-04-09 HISTORY — DX: Malignant (primary) neoplasm, unspecified: C80.1

## 2019-04-09 HISTORY — DX: Unspecified hearing loss, unspecified ear: H91.90

## 2019-04-09 HISTORY — DX: Unspecified glaucoma: H40.9

## 2019-04-09 LAB — COMPREHENSIVE METABOLIC PANEL
ALT: 25 U/L (ref 0–44)
AST: 44 U/L — ABNORMAL HIGH (ref 15–41)
Albumin: 4.2 g/dL (ref 3.5–5.0)
Alkaline Phosphatase: 49 U/L (ref 38–126)
Anion gap: 12 (ref 5–15)
BUN: 34 mg/dL — ABNORMAL HIGH (ref 8–23)
CO2: 21 mmol/L — ABNORMAL LOW (ref 22–32)
Calcium: 9.2 mg/dL (ref 8.9–10.3)
Chloride: 104 mmol/L (ref 98–111)
Creatinine, Ser: 1.27 mg/dL — ABNORMAL HIGH (ref 0.61–1.24)
GFR calc Af Amer: >60 mL/min (ref >60)
GFR calc non Af Amer: 53 mL/min — ABNORMAL LOW (ref >60)
Glucose, Bld: 122 mg/dL — ABNORMAL HIGH (ref 70–99)
Potassium: 4.1 mmol/L (ref 3.5–5.1)
Sodium: 137 mmol/L (ref 135–145)
Total Bilirubin: 0.6 mg/dL (ref 0.3–1.2)
Total Protein: 7.9 g/dL (ref 6.5–8.1)

## 2019-04-09 LAB — APTT: aPTT: 35 seconds (ref 24–36)

## 2019-04-09 LAB — CBC
HCT: 37.5 % — ABNORMAL LOW (ref 39.0–52.0)
Hemoglobin: 11.9 g/dL — ABNORMAL LOW (ref 13.0–17.0)
MCH: 28.3 pg (ref 26.0–34.0)
MCHC: 31.7 g/dL (ref 30.0–36.0)
MCV: 89.1 fL (ref 80.0–100.0)
Platelets: 171 10*3/uL (ref 150–400)
RBC: 4.21 MIL/uL — ABNORMAL LOW (ref 4.22–5.81)
RDW: 15.9 % — ABNORMAL HIGH (ref 11.5–15.5)
WBC: 3.8 10*3/uL — ABNORMAL LOW (ref 4.0–10.5)
nRBC: 0 % (ref 0.0–0.2)

## 2019-04-09 LAB — URINALYSIS, ROUTINE W REFLEX MICROSCOPIC
Bilirubin Urine: NEGATIVE
Glucose, UA: NEGATIVE mg/dL
Hgb urine dipstick: NEGATIVE
Ketones, ur: NEGATIVE mg/dL
Leukocytes,Ua: NEGATIVE
Nitrite: NEGATIVE
Protein, ur: NEGATIVE mg/dL
Specific Gravity, Urine: 1.008 (ref 1.005–1.030)
pH: 5 (ref 5.0–8.0)

## 2019-04-09 LAB — BLOOD GAS, ARTERIAL
Acid-base deficit: 0.1 mmol/L (ref 0.0–2.0)
Bicarbonate: 23.6 mmol/L (ref 20.0–28.0)
Drawn by: 421801
FIO2: 21
O2 Saturation: 97.5 %
Patient temperature: 37
pCO2 arterial: 35.8 mmHg (ref 32.0–48.0)
pH, Arterial: 7.435 (ref 7.350–7.450)
pO2, Arterial: 93.5 mmHg (ref 83.0–108.0)

## 2019-04-09 LAB — TYPE AND SCREEN
ABO/RH(D): A POS
Antibody Screen: NEGATIVE

## 2019-04-09 LAB — BRAIN NATRIURETIC PEPTIDE: B Natriuretic Peptide: 194.8 pg/mL — ABNORMAL HIGH (ref 0.0–100.0)

## 2019-04-09 LAB — SURGICAL PCR SCREEN
MRSA, PCR: POSITIVE — AB
Staphylococcus aureus: POSITIVE — AB

## 2019-04-09 LAB — HEMOGLOBIN A1C
Hgb A1c MFr Bld: 6.2 % — ABNORMAL HIGH (ref 4.8–5.6)
Mean Plasma Glucose: 131.24 mg/dL

## 2019-04-09 LAB — PROTIME-INR
INR: 1.1 (ref 0.8–1.2)
Prothrombin Time: 14.3 seconds (ref 11.4–15.2)

## 2019-04-09 LAB — GLUCOSE, CAPILLARY: Glucose-Capillary: 127 mg/dL — ABNORMAL HIGH (ref 70–99)

## 2019-04-09 LAB — ABO/RH: ABO/RH(D): A POS

## 2019-04-09 NOTE — Progress Notes (Signed)
Patient denies shortness of breath, fever, cough and chest pain.  PCP - Dr [name] also uses [facility] [place] Cardiologist - Dr Haroldine Laws  Chest x-ray - 04/09/19 EKG - 04/09/19 Stress Test - 07/24/05 ECHO -01/20/19  Cardiac Cath - 03/18/19  Sleep Study - Yes CPAP - Uses CPAP nightly  Bring CPAP with you to hospital on DOS.  Fasting Blood Sugar - Unknown Checks Blood Sugar __0__ times a day  Patient does not take any diabetes medication.  Patient lost 60 lbs.  Diet Controlled.  Blood Thinner Instructions:  Per MD, Eliquis last dose on 04/08/19.  Patient forgot and took eliquis morning of 04/09/19. Last dose was 04/09/19.  Anesthesia review: Yes  STOP now taking any Aspirin (unless otherwise instructed by your surgeon), Aleve, Naproxen, Ibuprofen, Motrin, Advil, Goody's, BC's, all herbal medications, fish oil, and all vitamins.   Coronavirus Screening Have you experienced the following symptoms:  Cough yes/no: No Fever (>100.73F)  yes/no: No Runny nose yes/no: No Sore throat yes/no: No Difficulty breathing/shortness of breath  yes/no: No  Have you traveled in the last 14 days and where? yes/no: No  Patient verbalized understanding of instructions that were given to them at the PAT appointment.

## 2019-04-10 LAB — NOVEL CORONAVIRUS, NAA (HOSP ORDER, SEND-OUT TO REF LAB; TAT 18-24 HRS): SARS-CoV-2, NAA: NOT DETECTED

## 2019-04-12 MED ORDER — SODIUM CHLORIDE 0.9 % IV SOLN
INTRAVENOUS | Status: DC
  Filled 2019-04-12: qty 30

## 2019-04-12 MED ORDER — POTASSIUM CHLORIDE 2 MEQ/ML IV SOLN
80.0000 meq | INTRAVENOUS | Status: DC
  Filled 2019-04-12: qty 40

## 2019-04-12 MED ORDER — MAGNESIUM SULFATE 50 % IJ SOLN
40.0000 meq | INTRAMUSCULAR | Status: DC
  Filled 2019-04-12: qty 9.85

## 2019-04-12 MED ORDER — DEXMEDETOMIDINE HCL IN NACL 400 MCG/100ML IV SOLN
0.1000 ug/kg/h | INTRAVENOUS | Status: AC
  Administered 2019-04-13: 1 ug/kg/h via INTRAVENOUS
  Administered 2019-04-13: 11:00:00 97 ug via INTRAVENOUS
  Filled 2019-04-12: qty 100

## 2019-04-12 MED ORDER — VANCOMYCIN HCL 1500 MG/300ML IV SOLN
1500.0000 mg | INTRAVENOUS | Status: AC
  Administered 2019-04-13: 1500 mg via INTRAVENOUS
  Filled 2019-04-12: qty 300

## 2019-04-12 MED ORDER — SODIUM CHLORIDE 0.9 % IV SOLN
1.5000 g | INTRAVENOUS | Status: AC
  Administered 2019-04-13: 1.5 g via INTRAVENOUS
  Filled 2019-04-12: qty 1.5

## 2019-04-12 MED ORDER — VANCOMYCIN HCL 10 G IV SOLR
1500.0000 mg | INTRAVENOUS | Status: DC
  Filled 2019-04-12: qty 1500

## 2019-04-12 MED ORDER — NOREPINEPHRINE 4 MG/250ML-% IV SOLN
0.0000 ug/min | INTRAVENOUS | Status: AC
  Administered 2019-04-13: 2 ug/min via INTRAVENOUS
  Filled 2019-04-12: qty 250

## 2019-04-13 ENCOUNTER — Inpatient Hospital Stay (HOSPITAL_COMMUNITY)
Admission: RE | Admit: 2019-04-13 | Discharge: 2019-04-14 | DRG: 266 | Disposition: A | Discharge status: Home / Self Care | Payer: Medicare Other | Attending: Cardiovascular Disease | Admitting: Cardiovascular Disease

## 2019-04-13 ENCOUNTER — Encounter (HOSPITAL_COMMUNITY): Payer: Self-pay | Admitting: Cardiovascular Disease

## 2019-04-13 ENCOUNTER — Inpatient Hospital Stay (HOSPITAL_COMMUNITY): Payer: Medicare Other

## 2019-04-13 ENCOUNTER — Other Ambulatory Visit: Payer: Self-pay

## 2019-04-13 ENCOUNTER — Other Ambulatory Visit: Payer: Self-pay | Admitting: Physician Assistant

## 2019-04-13 ENCOUNTER — Inpatient Hospital Stay (HOSPITAL_COMMUNITY)
Admission: RE | Admit: 2019-04-13 | Discharge: 2019-04-13 | Disposition: A | Discharge status: Home / Self Care | Payer: Medicare Other | Source: Ambulatory Visit | Attending: Cardiovascular Disease | Admitting: Cardiovascular Disease

## 2019-04-13 ENCOUNTER — Encounter (HOSPITAL_COMMUNITY)
Admission: RE | Disposition: A | Discharge status: Home / Self Care | Payer: Self-pay | Source: Home / Self Care | Attending: Cardiovascular Disease

## 2019-04-13 ENCOUNTER — Inpatient Hospital Stay (HOSPITAL_COMMUNITY): Payer: Medicare Other | Admitting: Physician Assistant

## 2019-04-13 ENCOUNTER — Inpatient Hospital Stay (HOSPITAL_COMMUNITY): Payer: Medicare Other | Admitting: Certified Registered Nurse Anesthetist

## 2019-04-13 DIAGNOSIS — Z006 Encounter for examination for normal comparison and control in clinical research program: Secondary | ICD-10-CM

## 2019-04-13 DIAGNOSIS — I083 Combined rheumatic disorders of mitral, aortic and tricuspid valves: Principal | ICD-10-CM | POA: Diagnosis present

## 2019-04-13 DIAGNOSIS — Z888 Allergy status to other drugs, medicaments and biological substances status: Secondary | ICD-10-CM

## 2019-04-13 DIAGNOSIS — Z885 Allergy status to narcotic agent status: Secondary | ICD-10-CM

## 2019-04-13 DIAGNOSIS — Z7901 Long term (current) use of anticoagulants: Secondary | ICD-10-CM

## 2019-04-13 DIAGNOSIS — C9210 Chronic myeloid leukemia, BCR/ABL-positive, not having achieved remission: Secondary | ICD-10-CM | POA: Diagnosis not present

## 2019-04-13 DIAGNOSIS — K219 Gastro-esophageal reflux disease without esophagitis: Secondary | ICD-10-CM | POA: Diagnosis present

## 2019-04-13 DIAGNOSIS — Z8249 Family history of ischemic heart disease and other diseases of the circulatory system: Secondary | ICD-10-CM

## 2019-04-13 DIAGNOSIS — I110 Hypertensive heart disease with heart failure: Secondary | ICD-10-CM | POA: Diagnosis not present

## 2019-04-13 DIAGNOSIS — E785 Hyperlipidemia, unspecified: Secondary | ICD-10-CM | POA: Diagnosis not present

## 2019-04-13 DIAGNOSIS — E669 Obesity, unspecified: Secondary | ICD-10-CM | POA: Diagnosis present

## 2019-04-13 DIAGNOSIS — I350 Nonrheumatic aortic (valve) stenosis: Secondary | ICD-10-CM

## 2019-04-13 DIAGNOSIS — G4733 Obstructive sleep apnea (adult) (pediatric): Secondary | ICD-10-CM | POA: Diagnosis present

## 2019-04-13 DIAGNOSIS — Z79899 Other long term (current) drug therapy: Secondary | ICD-10-CM | POA: Diagnosis not present

## 2019-04-13 DIAGNOSIS — E039 Hypothyroidism, unspecified: Secondary | ICD-10-CM | POA: Diagnosis present

## 2019-04-13 DIAGNOSIS — I5033 Acute on chronic diastolic (congestive) heart failure: Secondary | ICD-10-CM | POA: Diagnosis not present

## 2019-04-13 DIAGNOSIS — J90 Pleural effusion, not elsewhere classified: Secondary | ICD-10-CM | POA: Diagnosis present

## 2019-04-13 DIAGNOSIS — Z79890 Hormone replacement therapy (postmenopausal): Secondary | ICD-10-CM

## 2019-04-13 DIAGNOSIS — Z952 Presence of prosthetic heart valve: Secondary | ICD-10-CM

## 2019-04-13 DIAGNOSIS — I313 Pericardial effusion (noninflammatory): Secondary | ICD-10-CM | POA: Diagnosis not present

## 2019-04-13 DIAGNOSIS — I2510 Atherosclerotic heart disease of native coronary artery without angina pectoris: Secondary | ICD-10-CM | POA: Diagnosis not present

## 2019-04-13 DIAGNOSIS — I4821 Permanent atrial fibrillation: Secondary | ICD-10-CM | POA: Diagnosis not present

## 2019-04-13 DIAGNOSIS — J9811 Atelectasis: Secondary | ICD-10-CM | POA: Diagnosis not present

## 2019-04-13 DIAGNOSIS — Z87891 Personal history of nicotine dependence: Secondary | ICD-10-CM

## 2019-04-13 DIAGNOSIS — I482 Chronic atrial fibrillation, unspecified: Secondary | ICD-10-CM | POA: Diagnosis present

## 2019-04-13 DIAGNOSIS — Z6829 Body mass index (BMI) 29.0-29.9, adult: Secondary | ICD-10-CM

## 2019-04-13 DIAGNOSIS — E119 Type 2 diabetes mellitus without complications: Secondary | ICD-10-CM | POA: Diagnosis present

## 2019-04-13 DIAGNOSIS — J449 Chronic obstructive pulmonary disease, unspecified: Secondary | ICD-10-CM | POA: Diagnosis not present

## 2019-04-13 DIAGNOSIS — F329 Major depressive disorder, single episode, unspecified: Secondary | ICD-10-CM | POA: Diagnosis present

## 2019-04-13 DIAGNOSIS — I10 Essential (primary) hypertension: Secondary | ICD-10-CM | POA: Diagnosis present

## 2019-04-13 DIAGNOSIS — E559 Vitamin D deficiency, unspecified: Secondary | ICD-10-CM | POA: Diagnosis not present

## 2019-04-13 DIAGNOSIS — J439 Emphysema, unspecified: Secondary | ICD-10-CM | POA: Diagnosis not present

## 2019-04-13 DIAGNOSIS — E1169 Type 2 diabetes mellitus with other specified complication: Secondary | ICD-10-CM | POA: Diagnosis present

## 2019-04-13 DIAGNOSIS — I5032 Chronic diastolic (congestive) heart failure: Secondary | ICD-10-CM | POA: Diagnosis not present

## 2019-04-13 DIAGNOSIS — M1990 Unspecified osteoarthritis, unspecified site: Secondary | ICD-10-CM | POA: Diagnosis present

## 2019-04-13 DIAGNOSIS — Z9221 Personal history of antineoplastic chemotherapy: Secondary | ICD-10-CM

## 2019-04-13 DIAGNOSIS — F419 Anxiety disorder, unspecified: Secondary | ICD-10-CM | POA: Diagnosis present

## 2019-04-13 HISTORY — PX: INTRAOPERATIVE TRANSTHORACIC ECHOCARDIOGRAM: SHX6523

## 2019-04-13 HISTORY — PX: TRANSCATHETER AORTIC VALVE REPLACEMENT, TRANSFEMORAL: SHX6400

## 2019-04-13 HISTORY — DX: Presence of prosthetic heart valve: Z95.2

## 2019-04-13 LAB — POCT I-STAT, CHEM 8
BUN: 25 mg/dL — ABNORMAL HIGH (ref 8–23)
BUN: 26 mg/dL — ABNORMAL HIGH (ref 8–23)
BUN: 25 mg/dL — ABNORMAL HIGH (ref 8–23)
BUN: 24 mg/dL — ABNORMAL HIGH (ref 8–23)
Calcium, Ion: 1.22 mmol/L (ref 1.15–1.40)
Calcium, Ion: 1.23 mmol/L (ref 1.15–1.40)
Calcium, Ion: 1.25 mmol/L (ref 1.15–1.40)
Calcium, Ion: 1.26 mmol/L (ref 1.15–1.40)
Chloride: 104 mmol/L (ref 98–111)
Chloride: 105 mmol/L (ref 98–111)
Chloride: 105 mmol/L (ref 98–111)
Chloride: 106 mmol/L (ref 98–111)
Creatinine, Ser: 1 mg/dL (ref 0.61–1.24)
Creatinine, Ser: 1 mg/dL (ref 0.61–1.24)
Creatinine, Ser: 1 mg/dL (ref 0.61–1.24)
Creatinine, Ser: 0.9 mg/dL (ref 0.61–1.24)
Glucose, Bld: 143 mg/dL — ABNORMAL HIGH (ref 70–99)
Glucose, Bld: 155 mg/dL — ABNORMAL HIGH (ref 70–99)
Glucose, Bld: 158 mg/dL — ABNORMAL HIGH (ref 70–99)
Glucose, Bld: 142 mg/dL — ABNORMAL HIGH (ref 70–99)
HCT: 30 % — ABNORMAL LOW (ref 39.0–52.0)
HCT: 30 % — ABNORMAL LOW (ref 39.0–52.0)
HCT: 30 % — ABNORMAL LOW (ref 39.0–52.0)
HCT: 29 % — ABNORMAL LOW (ref 39.0–52.0)
Hemoglobin: 10.2 g/dL — ABNORMAL LOW (ref 13.0–17.0)
Hemoglobin: 10.2 g/dL — ABNORMAL LOW (ref 13.0–17.0)
Hemoglobin: 10.2 g/dL — ABNORMAL LOW (ref 13.0–17.0)
Hemoglobin: 9.9 g/dL — ABNORMAL LOW (ref 13.0–17.0)
Potassium: 3.8 mmol/L (ref 3.5–5.1)
Potassium: 4 mmol/L (ref 3.5–5.1)
Potassium: 4 mmol/L (ref 3.5–5.1)
Potassium: 3.9 mmol/L (ref 3.5–5.1)
Sodium: 141 mmol/L (ref 135–145)
Sodium: 141 mmol/L (ref 135–145)
Sodium: 144 mmol/L (ref 135–145)
Sodium: 142 mmol/L (ref 135–145)
TCO2: 24 mmol/L (ref 22–32)
TCO2: 25 mmol/L (ref 22–32)
TCO2: 26 mmol/L (ref 22–32)
TCO2: 27 mmol/L (ref 22–32)

## 2019-04-13 LAB — POCT I-STAT 7, (LYTES, BLD GAS, ICA,H+H)
Acid-base deficit: 1 mmol/L (ref 0.0–2.0)
Bicarbonate: 26 mmol/L (ref 20.0–28.0)
Calcium, Ion: 1.27 mmol/L (ref 1.15–1.40)
HCT: 31 % — ABNORMAL LOW (ref 39.0–52.0)
Hemoglobin: 10.5 g/dL — ABNORMAL LOW (ref 13.0–17.0)
O2 Saturation: 98 %
Potassium: 4 mmol/L (ref 3.5–5.1)
Sodium: 142 mmol/L (ref 135–145)
TCO2: 28 mmol/L (ref 22–32)
pCO2 arterial: 53.8 mmHg — ABNORMAL HIGH (ref 32.0–48.0)
pH, Arterial: 7.292 — ABNORMAL LOW (ref 7.350–7.450)
pO2, Arterial: 121 mmHg — ABNORMAL HIGH (ref 83.0–108.0)

## 2019-04-13 LAB — GLUCOSE, CAPILLARY: Glucose-Capillary: 122 mg/dL — ABNORMAL HIGH (ref 70–99)

## 2019-04-13 SURGERY — IMPLANTATION, AORTIC VALVE, TRANSCATHETER, FEMORAL APPROACH
Anesthesia: Monitor Anesthesia Care | Site: Chest

## 2019-04-13 MED ORDER — VANCOMYCIN HCL IN DEXTROSE 1-5 GM/200ML-% IV SOLN
1000.0000 mg | Freq: Once | INTRAVENOUS | Status: AC
  Administered 2019-04-14: 1000 mg via INTRAVENOUS
  Filled 2019-04-13: qty 200

## 2019-04-13 MED ORDER — PROPOFOL 500 MG/50ML IV EMUL
INTRAVENOUS | Status: DC | PRN
  Administered 2019-04-13: 25 ug/kg/min via INTRAVENOUS

## 2019-04-13 MED ORDER — FENTANYL CITRATE (PF) 250 MCG/5ML IJ SOLN
INTRAMUSCULAR | Status: AC
  Filled 2019-04-13: qty 5

## 2019-04-13 MED ORDER — ONDANSETRON HCL 4 MG/2ML IJ SOLN: 4.0000 mg | Freq: Four times a day (QID) | INTRAMUSCULAR | Status: DC | PRN

## 2019-04-13 MED ORDER — SODIUM CHLORIDE 0.9 % IV SOLN
INTRAVENOUS | Status: DC | PRN
  Administered 2019-04-13: 3000 mL via INTRAMUSCULAR

## 2019-04-13 MED ORDER — MORPHINE SULFATE (PF) 2 MG/ML IV SOLN: 1.0000 mg | INTRAVENOUS | Status: DC | PRN

## 2019-04-13 MED ORDER — SODIUM CHLORIDE 0.9 % IV SOLN
INTRAVENOUS | Status: AC
  Filled 2019-04-13 (×3): qty 1.2

## 2019-04-13 MED ORDER — ONDANSETRON HCL 4 MG/2ML IJ SOLN
INTRAMUSCULAR | Status: DC | PRN
  Administered 2019-04-13: 4 mg via INTRAVENOUS

## 2019-04-13 MED ORDER — CHLORHEXIDINE GLUCONATE 4 % EX LIQD: 60.0000 mL | Freq: Once | CUTANEOUS | Status: DC

## 2019-04-13 MED ORDER — HEPARIN SODIUM (PORCINE) 1000 UNIT/ML IJ SOLN
INTRAMUSCULAR | Status: AC
  Filled 2019-04-13: qty 2

## 2019-04-13 MED ORDER — ROSUVASTATIN CALCIUM 5 MG PO TABS
10.0000 mg | ORAL_TABLET | Freq: Every day | ORAL | Status: DC
  Administered 2019-04-13 – 2019-04-14 (×2): 10 mg via ORAL
  Filled 2019-04-13 (×2): qty 2

## 2019-04-13 MED ORDER — SODIUM CHLORIDE 0.9% FLUSH: 3.0000 mL | INTRAVENOUS | Status: DC | PRN

## 2019-04-13 MED ORDER — FERROUS SULFATE 325 (65 FE) MG PO TABS
325.0000 mg | ORAL_TABLET | Freq: Every evening | ORAL | Status: DC
  Administered 2019-04-13: 22:00:00 325 mg via ORAL
  Filled 2019-04-13: qty 1

## 2019-04-13 MED ORDER — PHENYLEPHRINE HCL-NACL 20-0.9 MG/250ML-% IV SOLN
0.0000 ug/min | INTRAVENOUS | Status: DC
  Filled 2019-04-13: qty 250

## 2019-04-13 MED ORDER — FINASTERIDE 5 MG PO TABS
5.0000 mg | ORAL_TABLET | Freq: Every evening | ORAL | Status: DC
  Administered 2019-04-13: 22:00:00 5 mg via ORAL
  Filled 2019-04-13: qty 1

## 2019-04-13 MED ORDER — CHLORHEXIDINE GLUCONATE 0.12 % MT SOLN
15.0000 mL | Freq: Once | OROMUCOSAL | Status: AC
  Administered 2019-04-13: 09:00:00 15 mL via OROMUCOSAL
  Filled 2019-04-13: qty 15

## 2019-04-13 MED ORDER — ACETAMINOPHEN 325 MG PO TABS: 650.0000 mg | ORAL_TABLET | Freq: Four times a day (QID) | ORAL | Status: DC | PRN

## 2019-04-13 MED ORDER — DASATINIB 100 MG PO TABS
100.0000 mg | ORAL_TABLET | Freq: Every evening | ORAL | Status: DC
  Administered 2019-04-13: 19:00:00 100 mg via ORAL

## 2019-04-13 MED ORDER — SODIUM CHLORIDE 0.9 % IV SOLN
1.5000 g | Freq: Two times a day (BID) | INTRAVENOUS | Status: DC
  Administered 2019-04-13: 23:00:00 1.5 g via INTRAVENOUS
  Filled 2019-04-13 (×2): qty 1.5

## 2019-04-13 MED ORDER — HEPARIN SODIUM (PORCINE) 1000 UNIT/ML IJ SOLN
INTRAMUSCULAR | Status: DC | PRN
  Administered 2019-04-13: 12000 [IU] via INTRAVENOUS

## 2019-04-13 MED ORDER — PROTAMINE SULFATE 10 MG/ML IV SOLN
INTRAVENOUS | Status: DC | PRN
  Administered 2019-04-13: 120 mg via INTRAVENOUS

## 2019-04-13 MED ORDER — LACTATED RINGERS IV SOLN: INTRAVENOUS | Status: DC | PRN

## 2019-04-13 MED ORDER — ONDANSETRON HCL 4 MG/2ML IJ SOLN
INTRAMUSCULAR | Status: AC
  Filled 2019-04-13: qty 2

## 2019-04-13 MED ORDER — NITROGLYCERIN IN D5W 200-5 MCG/ML-% IV SOLN: 0.0000 ug/min | INTRAVENOUS | Status: DC

## 2019-04-13 MED ORDER — LIDOCAINE HCL (PF) 1 % IJ SOLN
INTRAMUSCULAR | Status: AC
  Filled 2019-04-13: qty 30

## 2019-04-13 MED ORDER — SODIUM CHLORIDE 0.9 % IV SOLN: INTRAVENOUS | Status: DC

## 2019-04-13 MED ORDER — LIDOCAINE HCL 1 % IJ SOLN
INTRAMUSCULAR | Status: DC | PRN
  Administered 2019-04-13: 10 mL

## 2019-04-13 MED ORDER — PROTAMINE SULFATE 10 MG/ML IV SOLN
INTRAVENOUS | Status: AC
  Filled 2019-04-13: qty 25

## 2019-04-13 MED ORDER — OXYCODONE HCL 5 MG PO TABS: 5.0000 mg | ORAL_TABLET | ORAL | Status: DC | PRN

## 2019-04-13 MED ORDER — IODIXANOL 320 MG/ML IV SOLN
INTRAVENOUS | Status: DC | PRN
  Administered 2019-04-13: 11:00:00 45 mL

## 2019-04-13 MED ORDER — SODIUM CHLORIDE 0.9 % IV SOLN
INTRAVENOUS | Status: AC
  Administered 2019-04-13: 15:00:00 250 mL/h via INTRAVENOUS
  Administered 2019-04-13: 500 mL/h via INTRAVENOUS

## 2019-04-13 MED ORDER — FENTANYL CITRATE (PF) 100 MCG/2ML IJ SOLN
INTRAMUSCULAR | Status: DC | PRN
  Administered 2019-04-13 (×2): 25 ug via INTRAVENOUS
  Administered 2019-04-13: 50 ug via INTRAVENOUS

## 2019-04-13 MED ORDER — SODIUM CHLORIDE 0.9 % IV SOLN
INTRAVENOUS | Status: AC
  Filled 2019-04-13: qty 1.2

## 2019-04-13 MED ORDER — ASPIRIN 81 MG PO CHEW
81.0000 mg | CHEWABLE_TABLET | Freq: Every day | ORAL | Status: DC
  Administered 2019-04-14: 81 mg via ORAL
  Filled 2019-04-13: qty 1

## 2019-04-13 MED ORDER — SODIUM CHLORIDE 0.9 % IV SOLN: 250.0000 mL | INTRAVENOUS | Status: DC | PRN

## 2019-04-13 MED ORDER — CHLORHEXIDINE GLUCONATE 4 % EX LIQD: 30.0000 mL | CUTANEOUS | Status: DC

## 2019-04-13 MED ORDER — SODIUM CHLORIDE 0.9% FLUSH: 3.0000 mL | Freq: Two times a day (BID) | INTRAVENOUS | Status: DC

## 2019-04-13 MED ORDER — ACETAMINOPHEN 650 MG RE SUPP: 650.0000 mg | Freq: Four times a day (QID) | RECTAL | Status: DC | PRN

## 2019-04-13 MED ORDER — FAMOTIDINE 20 MG PO TABS
20.0000 mg | ORAL_TABLET | Freq: Two times a day (BID) | ORAL | Status: DC
  Administered 2019-04-13 – 2019-04-14 (×2): 20 mg via ORAL
  Filled 2019-04-13 (×2): qty 1

## 2019-04-13 MED ORDER — PAROXETINE HCL 20 MG PO TABS
40.0000 mg | ORAL_TABLET | Freq: Every day | ORAL | Status: DC
  Administered 2019-04-14: 09:00:00 40 mg via ORAL
  Filled 2019-04-13: qty 2

## 2019-04-13 MED ORDER — TRAZODONE HCL 50 MG PO TABS
50.0000 mg | ORAL_TABLET | Freq: Every day | ORAL | Status: DC
  Administered 2019-04-13: 22:00:00 50 mg via ORAL
  Filled 2019-04-13: qty 1

## 2019-04-13 MED ORDER — LACTATED RINGERS IV SOLN: INTRAVENOUS | Status: DC

## 2019-04-13 MED ORDER — TRAMADOL HCL 50 MG PO TABS: 50.0000 mg | ORAL_TABLET | ORAL | Status: DC | PRN

## 2019-04-13 MED ORDER — LEVOTHYROXINE SODIUM 100 MCG PO TABS
200.0000 ug | ORAL_TABLET | Freq: Every day | ORAL | Status: DC
  Administered 2019-04-14: 05:00:00 200 ug via ORAL
  Filled 2019-04-13: qty 2

## 2019-04-13 SURGICAL SUPPLY — 87 items
ADH SKN CLS APL DERMABOND .7 (GAUZE/BANDAGES/DRESSINGS) ×4
APL PRP STRL LF DISP 70% ISPRP (MISCELLANEOUS) ×2
BAG DECANTER FOR FLEXI CONT (MISCELLANEOUS) IMPLANT
BAG SNAP BAND KOVER 36X36 (MISCELLANEOUS) ×4 IMPLANT
BLADE CLIPPER SURG (BLADE) IMPLANT
BLADE STERNUM SYSTEM 6 (BLADE) IMPLANT
BLADE SURG 10 STRL SS (BLADE) IMPLANT
CABLE ADAPT CONN TEMP 6FT (ADAPTER) ×4 IMPLANT
CANISTER SUCT 3000ML PPV (MISCELLANEOUS) IMPLANT
CATH DIAG EXPO 6F AL1 (CATHETERS) IMPLANT
CATH DIAG EXPO 6F VENT PIG 145 (CATHETERS) ×8 IMPLANT
CATH EXTERNAL FEMALE PUREWICK (CATHETERS) IMPLANT
CATH INFINITI 6F AL2 (CATHETERS) IMPLANT
CATH S G BIP PACING (CATHETERS) ×4 IMPLANT
CHLORAPREP W/TINT 26 (MISCELLANEOUS) ×4 IMPLANT
CLIP VESOCCLUDE MED 24/CT (CLIP) IMPLANT
CLIP VESOCCLUDE SM WIDE 24/CT (CLIP) IMPLANT
CONT SPEC 4OZ CLIKSEAL STRL BL (MISCELLANEOUS) ×8 IMPLANT
COVER BACK TABLE 80X110 HD (DRAPES) IMPLANT
COVER WAND RF STERILE (DRAPES) ×1 IMPLANT
DECANTER SPIKE VIAL GLASS SM (MISCELLANEOUS) ×4 IMPLANT
DERMABOND ADVANCED (GAUZE/BANDAGES/DRESSINGS) ×4
DERMABOND ADVANCED .7 DNX12 (GAUZE/BANDAGES/DRESSINGS) ×3 IMPLANT
DEVICE CLOSURE PERCLS PRGLD 6F (VASCULAR PRODUCTS) ×4 IMPLANT
DRAPE INCISE IOBAN 66X45 STRL (DRAPES) IMPLANT
DRSG TEGADERM 4X4.75 (GAUZE/BANDAGES/DRESSINGS) ×8 IMPLANT
ELECT CAUTERY BLADE 6.4 (BLADE) IMPLANT
ELECT REM PT RETURN 9FT ADLT (ELECTROSURGICAL) ×8
ELECTRODE REM PT RTRN 9FT ADLT (ELECTROSURGICAL) ×4 IMPLANT
FELT TEFLON 6X6 (MISCELLANEOUS) ×1 IMPLANT
GAUZE SPONGE 2X2 8PLY STRL LF (GAUZE/BANDAGES/DRESSINGS) ×1 IMPLANT
GAUZE SPONGE 4X4 12PLY STRL (GAUZE/BANDAGES/DRESSINGS) ×4 IMPLANT
GAUZE SPONGE 4X4 16PLY XRAY LF (GAUZE/BANDAGES/DRESSINGS) ×3 IMPLANT
GLOVE BIO SURGEON STRL SZ7.5 (GLOVE) IMPLANT
GLOVE BIO SURGEON STRL SZ8 (GLOVE) IMPLANT
GLOVE EUDERMIC 7 POWDERFREE (GLOVE) IMPLANT
GLOVE ORTHO TXT STRL SZ7.5 (GLOVE) IMPLANT
GOWN STRL REUS W/ TWL LRG LVL3 (GOWN DISPOSABLE) IMPLANT
GOWN STRL REUS W/ TWL XL LVL3 (GOWN DISPOSABLE) ×2 IMPLANT
GOWN STRL REUS W/TWL LRG LVL3 (GOWN DISPOSABLE)
GOWN STRL REUS W/TWL XL LVL3 (GOWN DISPOSABLE) ×4
GUIDEWIRE SAF TJ AMPL .035X180 (WIRE) ×4 IMPLANT
GUIDEWIRE SAFE TJ AMPLATZ EXST (WIRE) ×4 IMPLANT
INSERT FOGARTY SM (MISCELLANEOUS) IMPLANT
KIT BASIN OR (CUSTOM PROCEDURE TRAY) ×4 IMPLANT
KIT HEART LEFT (KITS) ×4 IMPLANT
KIT SUCTION CATH 14FR (SUCTIONS) IMPLANT
KIT TURNOVER KIT B (KITS) ×4 IMPLANT
LOOP VESSEL MAXI BLUE (MISCELLANEOUS) IMPLANT
LOOP VESSEL MINI RED (MISCELLANEOUS) IMPLANT
NS IRRIG 1000ML POUR BTL (IV SOLUTION) ×4 IMPLANT
PACK ENDO MINOR (CUSTOM PROCEDURE TRAY) ×4 IMPLANT
PAD ARMBOARD 7.5X6 YLW CONV (MISCELLANEOUS) ×8 IMPLANT
PAD ELECT DEFIB RADIOL ZOLL (MISCELLANEOUS) ×4 IMPLANT
PENCIL BUTTON HOLSTER BLD 10FT (ELECTRODE) IMPLANT
PERCLOSE PROGLIDE 6F (VASCULAR PRODUCTS) ×8
POSITIONER HEAD DONUT 9IN (MISCELLANEOUS) ×4 IMPLANT
SET MICROPUNCTURE 5F STIFF (MISCELLANEOUS) ×4 IMPLANT
SHEATH BRITE TIP 7FR 35CM (SHEATH) ×4 IMPLANT
SHEATH PINNACLE 6F 10CM (SHEATH) ×4 IMPLANT
SHEATH PINNACLE 8F 10CM (SHEATH) ×4 IMPLANT
SLEEVE REPOSITIONING LENGTH 30 (MISCELLANEOUS) ×4 IMPLANT
SPONGE GAUZE 2X2 STER 10/PKG (GAUZE/BANDAGES/DRESSINGS) ×2
SPONGE LAP 18X18 RF (DISPOSABLE) ×3 IMPLANT
STOPCOCK MORSE 400PSI 3WAY (MISCELLANEOUS) ×8 IMPLANT
SUT ETHIBOND X763 2 0 SH 1 (SUTURE) IMPLANT
SUT GORETEX CV 4 TH 22 36 (SUTURE) IMPLANT
SUT GORETEX CV4 TH-18 (SUTURE) IMPLANT
SUT MNCRL AB 3-0 PS2 18 (SUTURE) IMPLANT
SUT PROLENE 5 0 C 1 36 (SUTURE) IMPLANT
SUT PROLENE 6 0 C 1 30 (SUTURE) IMPLANT
SUT SILK  1 MH (SUTURE) ×2
SUT SILK 1 MH (SUTURE) ×2 IMPLANT
SUT VIC AB 2-0 CT1 27 (SUTURE)
SUT VIC AB 2-0 CT1 TAPERPNT 27 (SUTURE) IMPLANT
SUT VIC AB 2-0 CTX 36 (SUTURE) IMPLANT
SUT VIC AB 3-0 SH 8-18 (SUTURE) IMPLANT
SYR 50ML LL SCALE MARK (SYRINGE) ×4 IMPLANT
SYR BULB IRRIGATION 50ML (SYRINGE) IMPLANT
SYR MEDRAD MARK V 150ML (SYRINGE) ×4 IMPLANT
TOWEL GREEN STERILE (TOWEL DISPOSABLE) ×8 IMPLANT
TRANSDUCER W/STOPCOCK (MISCELLANEOUS) ×8 IMPLANT
TRAY FOLEY SLVR 14FR TEMP STAT (SET/KITS/TRAYS/PACK) IMPLANT
TUBE SUCT INTRACARD DLP 20F (MISCELLANEOUS) IMPLANT
VALVE 26 ULTRA SAPIEN KIT (Valve) ×3 IMPLANT
WIRE EMERALD 3MM-J .035X150CM (WIRE) ×4 IMPLANT
WIRE EMERALD 3MM-J .035X260CM (WIRE) ×4 IMPLANT

## 2019-04-13 NOTE — Progress Notes (Signed)
Left femoral artery 55F sheath removed at 13:07, manual pressure held for 15 minutes. Left femoral vein 53F sheath removed at 13:15 manual pressure held for 10 minutes. Site level 0, gauze and tegaderm dressing applied Bedrest to begin at 13:30

## 2019-04-13 NOTE — Op Note (Signed)
[facility]   MULTIDISCIPLINARY HEART VALVE TEAM   TAVR OPERATIVE NOTE   Date of Procedure:  04/13/2019  Preoperative Diagnosis: Severe Aortic Stenosis   Postoperative Diagnosis: Same   Procedure:    Transcatheter Aortic Valve Replacement - Percutaneous Transfemoral Approach  Edwards Sapien 3 THV (size 26 mm, model # 9750 TFX, serial [id number])   Co-Surgeons:  Valentina Gu. Roxy Manns, MD and Sherren Mocha, MD  Anesthesiologist:  Hoy Morn, MD  Echocardiographer:  Sanda Klein, MD  Pre-operative Echo Findings:  Severe aortic stenosis  Normal left ventricular systolic function  Post-operative Echo Findings:  No paravalvular leak  Normal/unchanged left ventricular systolic function  BRIEF CLINICAL NOTE AND INDICATIONS FOR SURGERY  84 year old gentleman with history of permanent atrial fibrillation, chronic diastolic heart failure, type 2 diabetes, CML, and progressive now severe aortic stenosis.  The patient presented recently with progressive symptoms of heart failure and volume overload.  He was treated with diuretic therapy with good clinical response.  An echocardiogram demonstrated progression of aortic stenosis, now into the severe range with mean transvalvular gradient above 40 mmHg and dimensionless index of 0.20.  The patient underwent extensive evaluation including multidisciplinary heart valve team review.  He was deemed a suitable candidate for TAVR and he presents today for surgery.  During the course of the patient's preoperative work up they have been evaluated comprehensively a Mrs. White is a by a multidisciplinary team of specialists coordinated through the [facility] in the [facility].  They have been demonstrated to suffer from symptomatic severe aortic stenosis as noted above. The patient has been counseled extensively as to the relative risks and benefits of all options for the treatment of  severe aortic stenosis including long term medical therapy, conventional surgery for aortic valve replacement, and transcatheter aortic valve replacement.  The patient has been independently evaluated in formal cardiac surgical consultation by Dr Roxy Manns, who deemed the patient appropriate for TAVR. Based upon review of all of the patient's preoperative diagnostic tests they are felt to be candidate for transcatheter aortic valve replacement using the transfemoral approach as an alternative to conventional surgery.    Following the decision to proceed with transcatheter aortic valve replacement, a discussion has been held regarding what types of management strategies would be attempted intraoperatively in the event of life-threatening complications, including whether or not the patient would be considered a candidate for the use of cardiopulmonary bypass and/or conversion to open sternotomy for attempted surgical intervention.  The patient has been advised of a variety of complications that might develop peculiar to this approach including but not limited to risks of death, stroke, paravalvular leak, aortic dissection or other major vascular complications, aortic annulus rupture, device embolization, cardiac rupture or perforation, acute myocardial infarction, arrhythmia, heart block or bradycardia requiring permanent pacemaker placement, congestive heart failure, respiratory failure, renal failure, pneumonia, infection, other late complications related to structural valve deterioration or migration, or other complications that might ultimately cause a temporary or permanent loss of functional independence or other long term morbidity.  The patient provides full informed consent for the procedure as described and all questions were answered preoperatively.  DETAILS OF THE OPERATIVE PROCEDURE  PREPARATION:   The patient is brought to the operating room on the above mentioned date and central monitoring was  established by the anesthesia team including placement of a central venous catheter and radial arterial line. The patient is placed in the supine position on the operating  table.  Intravenous antibiotics are administered. The patient is monitored closely throughout the procedure under conscious sedation.  Baseline transthoracic echocardiogram is performed. The patient's chest, abdomen, both groins, and both lower extremities are prepared and draped in a sterile manner. A time out procedure is performed.   PERIPHERAL ACCESS:   Using ultrasound guidance, femoral arterial and venous access is obtained with placement of 6 Fr sheaths on the left side.  A pigtail diagnostic catheter was passed through the femoral arterial sheath under fluoroscopic guidance into the aortic root.  A temporary transvenous pacemaker catheter was passed through the femoral venous sheath under fluoroscopic guidance into the right ventricle.  The pacemaker was tested to ensure stable lead placement and pacemaker capture. Aortic root angiography was performed in order to determine the optimal angiographic angle for valve deployment.  TRANSFEMORAL ACCESS:  A micropuncture technique is used to access the right femoral artery under fluoroscopic and ultrasound guidance.  2 Perclose devices are deployed at 10' and 2' positions to 'PreClose' the femoral artery. An 8 French sheath is placed and then an Amplatz Superstiff wire is advanced through the sheath. This is changed out for a 14 French transfemoral E-Sheath after progressively dilating over the Superstiff wire.  An AL-2 catheter was used to direct a straight-tip exchange length wire across the native aortic valve into the left ventricle. This was exchanged out for a pigtail catheter and position was confirmed in the LV apex. Simultaneous LV and Ao pressures were recorded.  The pigtail catheter was exchanged for an Amplatz Extra-stiff wire in the LV apex.    BALLOON AORTIC  VALVULOPLASTY:  Not performed  TRANSCATHETER HEART VALVE DEPLOYMENT:  An Edwards Sapien 3 transcatheter heart valve (size 26 mm) was prepared and crimped per manufacturer's guidelines, and the proper orientation of the valve is confirmed on the [employer] delivery system. The valve was advanced through the introducer sheath using normal technique until in an appropriate position in the abdominal aorta beyond the sheath tip. The balloon was then retracted and using the fine-tuning wheel was centered on the valve. The valve was then advanced across the aortic arch using appropriate flexion of the catheter. The valve was carefully positioned across the aortic valve annulus. The Commander catheter was retracted using normal technique. Once final position of the valve has been confirmed by angiographic assessment, the valve is deployed while temporarily holding ventilation and during rapid ventricular pacing to maintain systolic blood pressure < 50 mmHg and pulse pressure < 10 mmHg. The balloon inflation is held for >3 seconds after reaching full deployment volume. Once the balloon has fully deflated the balloon is retracted into the ascending aorta and valve function is assessed using echocardiography. The patient's hemodynamic recovery following valve deployment is good.  The deployment balloon and guidewire are both removed. Echo demostrated acceptable post-procedural gradients, stable mitral valve function, and no aortic insufficiency.   PROCEDURE COMPLETION:  The sheath was removed and femoral artery closure is performed using the 2 previously deployed Perclose devices.  Protamine is administered once femoral arterial repair was complete. The site is clear with no evidence of bleeding or hematoma after the sutures are tightened. The temporary pacemaker and pigtail catheters are removed.  Manual pressure is used for left femoral artery and vein hemostasis.  The patient tolerated the procedure well and  is transported to the surgical intensive care in stable condition. There were no immediate intraoperative complications. All sponge instrument and needle counts are verified correct at completion of  the operation.   The patient received a total of 40 mL of intravenous contrast during the procedure.   Sherren Mocha, MD 04/13/2019 11:22 AM

## 2019-04-13 NOTE — Op Note (Signed)
[facility]   MULTIDISCIPLINARY HEART VALVE TEAM   TAVR OPERATIVE NOTE   Date of Procedure:  04/13/2019  Preoperative Diagnosis: Severe Aortic Stenosis   Postoperative Diagnosis: Same   Procedure:    Transcatheter Aortic Valve Replacement - Percutaneous Right Transfemoral Approach  Edwards Sapien 3 Ultra THV (size 26 mm, model # 9750TFX, serial # [id number])   Co-Surgeons:  Valentina Gu. Roxy Manns, MD and Sherren Mocha, MD  Anesthesiologist:  Hoy Morn, MD  Echocardiographer:  Sanda Klein, MD  Pre-operative Echo Findings:  Severe aortic stenosis  Normal left ventricular systolic function  Post-operative Echo Findings:  No paravalvular leak  Normal left ventricular systolic function   BRIEF CLINICAL NOTE AND INDICATIONS FOR SURGERY  Patient is an 84 year old male with history of aortic stenosis, nonobstructive coronary artery disease, longstanding persistent atrial fibrillation on long-term anticoagulation using Eliquis, chronic diastolic congestive heart failure, essential hypertension, type 2 diabetes mellitus, obstructive sleep apnea on CPAP, hypothyroidism, GE reflux disease, degenerative arthritis, and more recent diagnosis of CML treated with chemotherapy who has been referred for surgical consultation to discuss treatment options for management of severe symptomatic aortic stenosis.  Patient states that he has known presence of a heart murmur for many years.  His cardiac history dates back more than 10 years ago when he was first found to have atrial flutter.  He underwent atrial flutter ablation by Dr. Lovena Le and initially did well but subsequently developed atrial fibrillation.  He was treated for Tikosyn for a period of time but developed recurrent atrial fibrillation which has remained persistent.  He has been followed for many years by Dr. Haroldine Laws and previous echocardiograms have documented the presence of normal left ventricular systolic function  and aortic stenosis which has gradually progressed in severity.  Patient remains remarkably active until approximately 1 year ago when he was initially diagnosed with diabetes and subsequently found to have CML.  He has been treated with chemotherapy.  Over the past year he developed gradual worsening of exertional shortness of breath.  Echocardiogram performed January 20, 2019 revealed severe aortic stenosis with preserved left ventricular systolic function.  Peak velocity across aortic valve was reported close to 4.0 m/s with mean transvalvular gradient estimated 41.5 mmHg.  The DVI was notably quite low at 0.20.  Over the last several months his progressed substantially until early December when he was seen in the emergency department with acute exacerbation of chronic CHF with resting shortness of breath and orthopnea.  He was given intravenous Lasix with excellent response and has been followed carefully ever since by Dr. Haroldine Laws.  Symptoms of congestive heart failure have improved considerably.  Diagnostic cardiac catheterization was performed March 18, 2019 and notable for mild nonobstructive coronary artery disease.  Catheterization confirmed the presence of aortic stenosis with peak to peak and mean transvalvular gradients measured 51 and 36 mmHg, respectively.  Right heart pressures were mildly elevated.  The patient was referred to the multidisciplinary heart valve clinic and has been evaluated previously by Dr. Burt Knack.  CT angiography was performed and the patient referred for surgical consultation.  During the course of the patient's preoperative work up they have been evaluated comprehensively by a multidisciplinary team of specialists coordinated through the [facility] in the [facility].  They have been demonstrated to suffer from symptomatic severe aortic stenosis as noted above. The patient has been counseled extensively as to the relative  risks and benefits of all options for the  treatment of severe aortic stenosis including long term medical therapy, conventional surgery for aortic valve replacement, and transcatheter aortic valve replacement.  All questions have been answered, and the patient provides full informed consent for the operation as described.   DETAILS OF THE OPERATIVE PROCEDURE  PREPARATION:    The patient is brought to the operating room on the above mentioned date and appropriate monitoring was established by the anesthesia team. The patient is placed in the supine position on the operating table.  Intravenous antibiotics are administered. The patient is monitored closely throughout the procedure under conscious sedation.  Baseline transthoracic echocardiogram was performed. The patient's chest, abdomen, both groins, and both lower extremities are prepared and draped in a sterile manner. A time out procedure is performed.   PERIPHERAL ACCESS:    Using the modified Seldinger technique, femoral arterial and venous access was obtained with placement of 6 Fr sheaths on the left side.  A pigtail diagnostic catheter was passed through the left arterial sheath under fluoroscopic guidance into the aortic root.  A temporary transvenous pacemaker catheter was passed through the left femoral venous sheath under fluoroscopic guidance into the right ventricle.  The pacemaker was tested to ensure stable lead placement and pacemaker capture. Aortic root angiography was performed in order to determine the optimal angiographic angle for valve deployment.   TRANSFEMORAL ACCESS:   Percutaneous transfemoral access and sheath placement was performed using ultrasound guidance.  The right common femoral artery was cannulated using a micropuncture needle and appropriate location was verified using hand injection angiogram.  A pair of [employer] Perclose percutaneous closure devices were placed and a 6 French sheath replaced into the femoral  artery.  The patient was heparinized systemically and ACT verified > 250 seconds.    A 14 Fr transfemoral E-sheath was introduced into the right common femoral artery after progressively dilating over an Amplatz superstiff wire. An AL-2 catheter was used to direct a straight-tip exchange length wire across the native aortic valve into the left ventricle. This was exchanged out for a pigtail catheter and position was confirmed in the LV apex. Simultaneous LV and Ao pressures were recorded.  The pigtail catheter was exchanged for an Amplatz Extra-stiff wire in the LV apex.  Echocardiography was utilized to confirm appropriate wire position and no sign of entanglement in the mitral subvalvular apparatus.   TRANSCATHETER HEART VALVE DEPLOYMENT:   An Edwards Sapien 3 Ultra transcatheter heart valve (size 26 mm, model #9750TFX, serial [id number]) was prepared and crimped per manufacturer's guidelines, and the proper orientation of the valve is confirmed on the [employer] delivery system. The valve was advanced through the introducer sheath using normal technique until in an appropriate position in the abdominal aorta beyond the sheath tip. The balloon was then retracted and using the fine-tuning wheel was centered on the valve. The valve was then advanced across the aortic arch using appropriate flexion of the catheter. The valve was carefully positioned across the aortic valve annulus. The Commander catheter was retracted using normal technique. Once final position of the valve has been confirmed by angiographic assessment, the valve is deployed while temporarily holding ventilation and during rapid ventricular pacing to maintain systolic blood pressure < 50 mmHg and pulse pressure < 10 mmHg. The balloon inflation is held for >3 seconds after reaching full deployment volume. Once the balloon has fully deflated the balloon is retracted into the ascending aorta and valve function is assessed using  echocardiography. There is felt to be no  paravalvular leak and no central aortic insufficiency.  The patient's hemodynamic recovery following valve deployment is good.  The deployment balloon and guidewire are both removed.    PROCEDURE COMPLETION:   The sheath was removed and femoral artery closure performed.  Protamine was administered once femoral arterial repair was complete. The temporary pacemaker, pigtail catheters and femoral sheaths were removed with manual pressure used for hemostasis.  A Mynx femoral closure device was utilized following removal of the diagnostic sheath in the left femoral artery.  The patient tolerated the procedure well and is transported to the surgical intensive care in stable condition. There were no immediate intraoperative complications. All sponge instrument and needle counts are verified correct at completion of the operation.   No blood products were administered during the operation.  The patient received a total of 45 mL of intravenous contrast during the procedure.   Rexene Alberts, MD 04/13/2019 12:39 PM

## 2019-04-13 NOTE — Anesthesia Preprocedure Evaluation (Addendum)
Anesthesia Evaluation  Patient identified by MRN, date of birth, ID band Patient awake    Reviewed: Allergy & Precautions, NPO status , Patient's Chart, lab work & pertinent test results  Airway Mallampati: II  TM Distance: >3 FB Neck ROM: Full    Dental  (+) Dental Advisory Given, Caps,    Pulmonary sleep apnea and Continuous Positive Airway Pressure Ventilation , COPD, former smoker,    Pulmonary exam normal breath sounds clear to auscultation       Cardiovascular hypertension, Pt. on medications + CAD and +CHF  + dysrhythmias (Atrial flutter s/p ablation) + Valvular Problems/Murmurs AS  Rhythm:Regular Rate:Normal + Systolic murmurs    Neuro/Psych PSYCHIATRIC DISORDERS Anxiety Depression negative neurological ROS     GI/Hepatic Neg liver ROS, hiatal hernia, GERD  Medicated,  Endo/Other  diabetes, Well Controlled, Type 2Hypothyroidism   Renal/GU Renal InsufficiencyRenal disease     Musculoskeletal  (+) Arthritis ,   Abdominal   Peds  Hematology  (+) Blood dyscrasia (Eliquis), anemia ,   Anesthesia Other Findings Day of surgery medications reviewed with the patient.  Reproductive/Obstetrics                            Anesthesia Physical Anesthesia Plan  ASA: IV  Anesthesia Plan: MAC   Post-op Pain Management:    Induction: Intravenous  PONV Risk Score and Plan: 1 and Propofol infusion and Treatment may vary due to age or medical condition  Airway Management Planned: Natural Airway and Nasal Cannula  Additional Equipment: Arterial line  Intra-op Plan:   Post-operative Plan:   Informed Consent: I have reviewed the patients History and Physical, chart, labs and discussed the procedure including the risks, benefits and alternatives for the proposed anesthesia with the patient or authorized representative who has indicated his/her understanding and acceptance.     Dental advisory  given  Plan Discussed with: CRNA  Anesthesia Plan Comments:         Anesthesia Quick Evaluation

## 2019-04-13 NOTE — Discharge Instructions (Signed)
ACTIVITY AND EXERCISE °• Daily activity and exercise are an important part of your recovery. People recover at different rates depending on their general health and type of valve procedure. °• Most people recovering from TAVR feel better relatively quickly  °• No lifting, pushing, pulling more than 10 pounds (examples to avoid: groceries, vacuuming, gardening, golfing): °            - For one week with a procedure through the groin. °            - For six weeks for procedures through the chest wall or neck. °NOTE: You will typically see one of our providers 7-14 days after your procedure to discuss WHEN TO RESUME the above activities.  °  °  °DRIVING °• Do not drive until you are seen for follow up and cleared by a provider. Generally, we ask patient to not drive for 1 week after their procedure. °• If you have been told by your doctor in the past that you may not drive, you must talk with him/her before you begin driving again. °  °DRESSING °• Groin site: you may leave the clear dressing over the site for up to one week or until it falls off. °  °HYGIENE °• If you had a femoral (leg) procedure, you may take a shower when you return home. After the shower, pat the site dry. Do NOT use powder, oils or lotions in your groin area until the site has completely healed. °• If you had a chest procedure, you may shower when you return home unless specifically instructed not to by your discharging practitioner. °            - DO NOT scrub incision; pat dry with a towel. °            - DO NOT apply any lotions, oils, powders to the incision. °            - No tub baths / swimming for at least 2 weeks. °• If you notice any fevers, chills, increased pain, swelling, bleeding or pus, please contact your doctor. °  °ADDITIONAL INFORMATION °• If you are going to have an upcoming dental procedure, please contact our office as you will require antibiotics ahead of time to prevent infection on your heart valve.  ° ° °If you have any  questions or concerns you can call the structural heart phone during normal business hours 8am-4pm. If you have an urgent need after hours or weekends please call [phone] to talk to the on call provider for general cardiology. If you have an emergency that requires immediate attention, please call 911.  ° ° °After TAVR Checklist ° °Check  Test Description  ° Follow up appointment in 1-2 weeks  You will see our structural heart physician assistant, Katie Sheelah Ritacco. Your incision sites will be checked and you will be cleared to drive and resume all normal activities if you are doing well.    ° 1 month echo and follow up  You will have an echo to check on your new heart valve and be seen back in the office by Katie Patsy Varma. Many times the echo is not read by your appointment time, but Katie will call you later that day or the following day to report your results.  ° Follow up with your primary cardiologist You will need to be seen by your primary cardiologist in the following 3-6 months after your 1 month appointment in the valve   clinic. Often times your Plavix or Aspirin will be discontinued during this time, but this is decided on a case by case basis.   ° 1 year echo and follow up You will have another echo to check on your heart valve after 1 year and be seen back in the office by Katie Taunia Frasco. This your last structural heart visit.  ° Bacterial endocarditis prophylaxis  You will have to take antibiotics for the rest of your life before all dental procedures (even teeth cleanings) to protect your heart valve. Antibiotics are also required before some surgeries. Please check with your cardiologist before scheduling any surgeries. Also, please make sure to tell us if you have a penicillin allergy as you will require an alternative antibiotic.   ° ° °

## 2019-04-13 NOTE — Progress Notes (Signed)
Setup patient's home CPAP machine for him.  Placed sterile water in chamber.  Patient self manages machine at home so he placed his mask on and turned on machine.  No distress noted, O2 sat in upper 90s.  Will continue to monitor.

## 2019-04-13 NOTE — Anesthesia Procedure Notes (Signed)
Arterial Line Insertion Start/End1/03/2020 9:30 AM Performed by: Candis Shine, CRNA, CRNA  Patient location: Pre-op. Preanesthetic checklist: patient identified, IV checked, site marked, risks and benefits discussed, surgical consent, monitors and equipment checked, pre-op evaluation, timeout performed and anesthesia consent Lidocaine 1% used for infiltration Right, radial was placed Catheter size: 20 G Hand hygiene performed  and maximum sterile barriers used   Attempts: 1 Procedure performed without using ultrasound guided technique. Following insertion, dressing applied and Biopatch. Post procedure assessment: normal and unchanged  Patient tolerated the procedure well with no immediate complications.

## 2019-04-13 NOTE — Interval H&P Note (Signed)
History and Physical Interval Note:  04/13/2019 10:03 AM  [name] White  has presented today for surgery, with the diagnosis of Severe Aortic Stenosis.  The various methods of treatment have been discussed with the patient and family. After consideration of risks, benefits and other options for treatment, the patient has consented to  Procedure(s): TRANSCATHETER AORTIC VALVE REPLACEMENT, TRANSFEMORAL (N/A) TRANSESOPHAGEAL ECHOCARDIOGRAM (TEE) (N/A) as a surgical intervention.  The patient's history has been reviewed, patient examined, no change in status, stable for surgery.  I have reviewed the patient's chart and labs.  Questions were answered to the patient's satisfaction.     Sherren Mocha

## 2019-04-13 NOTE — Progress Notes (Signed)
R radial arterial line was pulled, and manual pressure was held for 8 min. R wrist is level zero. R radial pulse 2 + Capillary reffill less than 3 sec.

## 2019-04-13 NOTE — Progress Notes (Signed)
Echocardiogram 2D Echocardiogram has been performed.  [name] White [name] White 04/13/2019, 12:45 PM

## 2019-04-13 NOTE — Progress Notes (Signed)
  [place] VALVE TEAM  Patient doing well s/p TAVR. He is hemodynamically stable, but BP a little soft. Now off pressors. He has received almost 2 L of fluids. Groin sites stable. ECG with afib and no high grade block. If BP remains stable we will transfer to 4E. Plan for early ambulation after bedrest completed and hopeful discharge over the next 24-48 hours.   Angelena Form PA-C  [facility]  Pager [phone]

## 2019-04-13 NOTE — Anesthesia Postprocedure Evaluation (Signed)
Anesthesia Post Note  Patient: [name] White  Procedure(s) Performed: TRANSCATHETER AORTIC VALVE REPLACEMENT, TRANSFEMORAL (N/A Chest) TRANSTHORACIC ECHOCARDIOGRAM (N/A Chest)     Patient location during evaluation: Cath Lab Anesthesia Type: MAC Level of consciousness: awake and alert, oriented and awake Pain management: pain level controlled Vital Signs Assessment: post-procedure vital signs reviewed and stable Respiratory status: spontaneous breathing, nonlabored ventilation and respiratory function stable Cardiovascular status: stable and blood pressure returned to baseline Postop Assessment: no apparent nausea or vomiting Anesthetic complications: no    Last Vitals:  Vitals:   04/13/19 1302 04/13/19 1351  BP: (!) 118/59 (!) 94/51  Pulse: 62 68  Resp: 13 17  Temp: (!) 36.4 C 36.6 C  SpO2:      Last Pain:  Vitals:   04/13/19 1351  TempSrc: Temporal  PainSc: 0-No pain                 Catalina Gravel

## 2019-04-13 NOTE — Transfer of Care (Signed)
Immediate Anesthesia Transfer of Care Note  Patient: [name] White  Procedure(s) Performed: TRANSCATHETER AORTIC VALVE REPLACEMENT, TRANSFEMORAL (N/A Chest) TRANSTHORACIC ECHOCARDIOGRAM (N/A Chest)  Patient Location: Cath Lab  Anesthesia Type:MAC  Level of Consciousness: awake, alert  and oriented  Airway & Oxygen Therapy: Patient Spontanous Breathing and Patient connected to nasal cannula oxygen  Post-op Assessment: Report given to RN and Post -op Vital signs reviewed and stable  Post vital signs: Reviewed and stable  Last Vitals:  Vitals Value Taken Time  BP 118/59 04/13/19 1305  Temp 36.4 C 04/13/19 1302  Pulse 68 04/13/19 1307  Resp 13 04/13/19 1307  SpO2 95 % 04/13/19 1307  Vitals shown include unvalidated device data.  Last Pain:  Vitals:   04/13/19 1302  TempSrc: Temporal  PainSc: 0-No pain      Patients Stated Pain Goal: 4 (00/76/22 [id number])  Complications: No apparent anesthesia complications

## 2019-04-13 NOTE — Anesthesia Procedure Notes (Signed)
Procedure Name: MAC Date/Time: 04/13/2019 11:20 AM Performed by: Alain Marion, CRNA Pre-anesthesia Checklist: Patient identified, Emergency Drugs available, Suction available and Patient being monitored Oxygen Delivery Method: Simple face mask Placement Confirmation: positive ETCO2

## 2019-04-14 ENCOUNTER — Encounter: Payer: Self-pay | Admitting: *Deleted

## 2019-04-14 ENCOUNTER — Inpatient Hospital Stay (HOSPITAL_COMMUNITY): Payer: Medicare Other

## 2019-04-14 ENCOUNTER — Other Ambulatory Visit: Payer: Self-pay

## 2019-04-14 DIAGNOSIS — I5033 Acute on chronic diastolic (congestive) heart failure: Secondary | ICD-10-CM

## 2019-04-14 DIAGNOSIS — Z952 Presence of prosthetic heart valve: Secondary | ICD-10-CM

## 2019-04-14 LAB — CBC
HCT: 32.7 % — ABNORMAL LOW (ref 39.0–52.0)
Hemoglobin: 10.5 g/dL — ABNORMAL LOW (ref 13.0–17.0)
MCH: 28.5 pg (ref 26.0–34.0)
MCHC: 32.1 g/dL (ref 30.0–36.0)
MCV: 88.6 fL (ref 80.0–100.0)
Platelets: 106 10*3/uL — ABNORMAL LOW (ref 150–400)
RBC: 3.69 MIL/uL — ABNORMAL LOW (ref 4.22–5.81)
RDW: 16 % — ABNORMAL HIGH (ref 11.5–15.5)
WBC: 4.9 10*3/uL (ref 4.0–10.5)
nRBC: 0 % (ref 0.0–0.2)

## 2019-04-14 LAB — BASIC METABOLIC PANEL
Anion gap: 6 (ref 5–15)
BUN: 23 mg/dL (ref 8–23)
CO2: 24 mmol/L (ref 22–32)
Calcium: 8.3 mg/dL — ABNORMAL LOW (ref 8.9–10.3)
Chloride: 107 mmol/L (ref 98–111)
Creatinine, Ser: 1.02 mg/dL (ref 0.61–1.24)
GFR calc Af Amer: >60 mL/min (ref >60)
GFR calc non Af Amer: >60 mL/min (ref >60)
Glucose, Bld: 118 mg/dL — ABNORMAL HIGH (ref 70–99)
Potassium: 3.7 mmol/L (ref 3.5–5.1)
Sodium: 137 mmol/L (ref 135–145)

## 2019-04-14 LAB — MAGNESIUM: Magnesium: 2 mg/dL (ref 1.7–2.4)

## 2019-04-14 MED ORDER — ASPIRIN 81 MG PO CHEW: 81.0000 mg | CHEWABLE_TABLET | Freq: Every day | ORAL | Status: DC

## 2019-04-14 MED ORDER — SPIRONOLACTONE 12.5 MG HALF TABLET
12.5000 mg | ORAL_TABLET | Freq: Every day | ORAL | Status: DC
  Filled 2019-04-14: qty 1

## 2019-04-14 MED ORDER — TORSEMIDE 20 MG PO TABS: 20.0000 mg | ORAL_TABLET | Freq: Every day | ORAL | Status: DC

## 2019-04-14 MED ORDER — POTASSIUM CHLORIDE CRYS ER 20 MEQ PO TBCR: 20.0000 meq | EXTENDED_RELEASE_TABLET | Freq: Every evening | ORAL | Status: DC

## 2019-04-14 NOTE — Discharge Summary (Addendum)
[place] VALVE TEAM  Discharge Summary    Patient ID: [name] White MRN: [id number]; DOB: 1935-09-06  Admit date: 04/13/2019 Discharge date: 04/14/2019  Primary Care Provider: Libby Maw, MD  Primary Cardiologist: Dr. Haroldine Laws / Dr. Burt Knack & Dr. Roxy Manns (TAVR)  Discharge Diagnoses    Principal Problem:   S/P TAVR (transcatheter aortic valve replacement) Active Problems:   Hypothyroidism   Hyperlipidemia   Essential hypertension   GERD   Obstructive sleep apnea   Diabetes mellitus type 2 in obese [facility])   Chronic atrial fibrillation (HCC)   Pleural effusion, bilateral   Acute on chronic diastolic heart failure (HCC)   Severe aortic stenosis   Allergies Allergies  Allergen Reactions   Ace Inhibitors Anaphylaxis and Swelling    Face and lip swelling   Benazepril Anaphylaxis and Swelling    Face and lip swelling   Codeine Itching and Rash   Statins Other (See Comments)    Made very sick, doctor thought had heart attack; "my appearance, color, etc". Tolerating a low dose currently   Piroxicam Hives   Pseudoephedrine Other (See Comments)    Can't take because it interacts with dofetilide     Diagnostic Studies/Procedures    TAVR OPERATIVE NOTE   Date of Procedure:                04/13/2019  Preoperative Diagnosis:      Severe Aortic Stenosis   Postoperative Diagnosis:    Same   Procedure:        Transcatheter Aortic Valve Replacement - Percutaneous Right Transfemoral Approach             Edwards Sapien 3 Ultra THV (size 26 mm, model # 9750TFX, serial # [id number])              Co-Surgeons:                        Valentina Gu. Roxy Manns, MD and Sherren Mocha, MD  Anesthesiologist:                  Hoy Morn, MD  Echocardiographer:              Sanda Klein, MD  Pre-operative Echo Findings: ? Severe aortic stenosis ? Normal left ventricular systolic function  Post-operative Echo  Findings: ? No paravalvular leak ? Normal left ventricular systolic function  _____________   Echo 04/14/19: completed but pending formal read at the time of discharge    History of Present Illness     [name] White is a 84 y.o. 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   Patient states that he has known presence of a heart murmur for many years.  His cardiac history dates back more than 10 years ago when he was first found to have atrial flutter.  He underwent atrial flutter ablation by Dr. Lovena Le and initially did well but subsequently developed atrial fibrillation.  He was treated for Tikosyn for a period of time but developed recurrent atrial fibrillation which has remained persistent.  He has been followed for many years by Dr. Haroldine Laws and previous echocardiograms have documented the presence of normal left ventricular systolic  function and aortic stenosis which has gradually progressed in severity. Patient remains remarkably active until approximately 1 year ago when he was initially diagnosed with diabetes and subsequently found to have CML.  He has been treated with chemotherapy. Over the past year he developed gradual worsening of exertional shortness of breath. Echocardiogram performed January 20, 2019 revealed severe aortic stenosis with preserved left ventricular systolic function. Peak velocity across aortic valve was reported close to 4.0 m/s with mean transvalvular gradient estimated 41.5 mmHg. The DVI was notably quite low at 0.20. Over the last several months his symptoms progressed substantially until early December when he was seen in the emergency department with acute exacerbation of chronic CHF with  resting shortness of breath and orthopnea.  He was given intravenous Lasix with excellent response and has been followed carefully ever since by Dr. Haroldine Laws. Symptoms of congestive heart failure have improved considerably. Diagnostic cardiac catheterization was performed March 18, 2019 and notable for mild nonobstructive coronary artery disease. Catheterization confirmed the presence of aortic stenosis with peak to peak and mean transvalvular gradients measured 51 and 36 mmHg, respectively.  Right heart pressures were mildly elevated.   The patient has been evaluated by the multidisciplinary valve team and felt to have severe, symptomatic aortic stenosis and to be a suitable candidate for TAVR, which was set up for 04/13/19.    Hospital Course     Consultants: none  Severe AS: s/p successful TAVR with a 26 mm Edwards Sapien 3 Ultra THV via the TF approach on 04/13/19. Post operative echo completed but pending formal read. Groin sites are stable. ECG with chronic afib but no high grade heart block. He will be resumed on home Eliquis with the addition of a baby aspirin 81 mg x 6 months at discharge. Plan DC home today with close follow up in the office next week.    Acute on chronic diastolic CHF: as evidenced by an elevated BNP on pre admission lab work and pleural effusions on CXR. This has been treated with TAVR. He will resume home torsemide '20mg'$  daily and PRN metolazone. Per review of Dr. Clayborne Dana last office note, there is concern for potential cardiac amyloidis and he is planning for a cardiac MRI in the near future.  Pleural effusions: pt declined thoracentesis. CXR shows improving effusions. Will get CXR at 1 month follow up to follow pleural effusions.   HTN: BPs have been on low side. He was treated with IVFs after TAVR.   Chronic afib: rate well controlled. Will resume home Eliquis tonight.  _____________  Discharge Vitals Blood pressure (!) 103/56, pulse 81, temperature 98.3 F  (36.8 C), temperature source Oral, resp. rate 18, height '6\' 2"'$  (1.88 m), weight 102.8 kg, SpO2 98 %.  Filed Weights   04/13/19 0836 04/13/19 0854 04/14/19 0443  Weight: 97.2 kg 97.2 kg 102.8 kg    GEN: Well nourished, well developed, in no acute distress HEENT: normal Neck: no JVD or masses Cardiac:  irreg irreg, soft flow murmurs. No rubs, or gallops,no edema  Respiratory:  clear to auscultation bilaterally, normal work of breathing GI: soft, nontender, nondistended, + BS MS: no deformity or atrophy Skin: warm and dry, no rash.  Groin sites clear without hematoma or ecchymosis  Neuro:  Alert and Oriented x 3, Strength and sensation are intact Psych: euthymic mood, full affect    Labs & Radiologic Studies    CBC Recent Labs    04/13/19 1402 04/14/19 0239  WBC  --  4.9  HGB 9.9* 10.5*  HCT 29.0* 32.7*  MCV  --  88.6  PLT  --  696*   Basic Metabolic Panel Recent Labs    04/13/19 1402 04/14/19 0239  NA 142 137  K 3.9 3.7  CL 106 107  CO2  --  24  GLUCOSE 142* 118*  BUN 24* 23  CREATININE 0.90 1.02  CALCIUM  --  8.3*  MG  --  2.0   Liver Function Tests No results for input(s): AST, ALT, ALKPHOS, BILITOT, PROT, ALBUMIN in the last 72 hours. No results for input(s): LIPASE, AMYLASE in the last 72 hours. Cardiac Enzymes No results for input(s): CKTOTAL, CKMB, CKMBINDEX, TROPONINI in the last 72 hours. BNP Invalid input(s): POCBNP D-Dimer No results for input(s): DDIMER in the last 72 hours. Hemoglobin A1C No results for input(s): HGBA1C in the last 72 hours. Fasting Lipid Panel No results for input(s): CHOL, HDL, LDLCALC, TRIG, CHOLHDL, LDLDIRECT in the last 72 hours. Thyroid Function Tests No results for input(s): TSH, T4TOTAL, T3FREE, THYROIDAB in the last 72 hours.  Invalid input(s): FREET3 _____________  DG Chest 2 View  Result Date: 04/09/2019 CLINICAL DATA:  Preoperative evaluation for TAVR. EXAM: CHEST - 2 VIEW COMPARISON:  CT 03/31/2019.  Chest  x-ray 03/09/2019. FINDINGS: Mediastinum and hilar structures normal. Heart size normal. Improved aeration in the lung bases with interval resolution of bibasilar infiltrates/edema. Mild residual bibasilar atelectasis and small bilateral pleural effusions noted. No pneumothorax. Degenerative changes thoracic spine. IMPRESSION: Improved aeration lung bases with interval resolution of bibasilar infiltrates/edema. Mild residual bibasilar atelectasis and small bilateral pleural effusions noted. Electronically Signed   By: Marcello Moores  Register   On: 04/09/2019 12:44   CARDIAC CATHETERIZATION  Result Date: 03/18/2019  Prox RCA to Mid RCA lesion is 30% stenosed.  RPAV lesion is 30% stenosed.  Ost Cx to Prox Cx lesion is 50% stenosed.  Prox LAD to Mid LAD lesion is 20% stenosed.  Findings: Ao = 82/51(65) Lv = 138/8  RA = 5 RV = 38/6 PA = 37/13 (22) PCW = 14 Fick cardiac output/index = 9.8/4.3 PVR = 0.7 WU FA sat = 96% PA sat = 78%, 77% High SVC 75% AoV = peak gradient 20m HG, mean gradient 36 mmHG, AVA 1.4 cm2 Assessment: 1. Mild non-obstructive CAD 2. Moderate to severe AS 3.  Normal LV function by echo Plan/Discussion: Refer for TAVR eval. DGlori Bickers MD 3:17 PM   CT CORONARY MORPH W/CTA COR W/SCORE W/CA W/CM &/OR WO/CM  Addendum Date: 04/01/2019   ADDENDUM REPORT: 04/01/2019 22:09 CLINICAL DATA:  84year old male with severe aortic stenosis being evaluated for a TAVR procedure. EXAM: Cardiac TAVR CT TECHNIQUE: The patient was scanned on a [employer] A 120 kV retrospective scan was triggered in the descending thoracic aorta at 111 HU's. Gantry rotation speed was 250 msecs and collimation was .6 mm. No beta blockade or nitro were given. The 3D data set was reconstructed in 5% intervals of the R-R cycle. Systolic and diastolic phases were analyzed on a dedicated work station using MPR, MIP and VRT modes. The patient received 80 cc of contrast. FINDINGS: Aortic Valve: Trileaflet aortic valve with  severely thickened and calcified leaflets and no calcifications extending into the LVOT. Aorta: Mild dilatation of the ascending aorta measuring 44 x 43 mm. Mild diffuse atherosclerotic plaque and calcifications. No dissection. Sinotubular Junction: 31 x 30 mm Ascending Thoracic Aorta: 36 x 36 mm Aortic Arch: 28 x 28 mm Descending Thoracic Aorta:  28 x 28 mm Sinus of Valsalva Measurements: Non-coronary: 35 mm Right -coronary: 33 mm Left -coronary: 36 mm Coronary Artery Height above Annulus: Left Main: 14 mm Right Coronary: 18 mm Virtual Basal Annulus Measurements: Maximum/Minimum Diameter: 27.8 x 24.7 mm Mean Diameter: 25.4 mm Perimeter: 81.2 mm Area: 507 mm2 Optimum Fluoroscopic Angle for Delivery: LAO 5 CAU 5 IMPRESSION: 1. Trileaflet aortic valve with severely thickened and calcified leaflets and no calcifications extending into the LVOT. Aortic valve calcium score is 2881 consistent with severe aortic stenosis. Annular measurements are suitable for delivery of a 26 mm Edwards-SAPIEN 3 Ultra valve. 2. Sufficient coronary to annulus distance. 3. Optimum Fluoroscopic Angle for Delivery: LAO 5 CAU 5 4. There is a small filling defect in the most superior portion of the left atrial appendage that gets smaller on the delayed images and most probably represents poor contrast mixing. Electronically Signed   By: Ena Dawley   On: 04/01/2019 22:09   Result Date: 04/01/2019 EXAM: OVER-READ INTERPRETATION  CT CHEST The following report is an over-read performed by radiologist Dr. Vinnie Langton of [facility], [place] on 03/31/2019. This over-read does not include interpretation of cardiac or coronary anatomy or pathology. The coronary calcium score/coronary CTA interpretation by the cardiologist is attached. COMPARISON:  Cardiac CTA 08/13/2011. FINDINGS: Extracardiac findings were described separately under dictation for contemporaneously obtained CTA chest, abdomen and pelvis. IMPRESSION: Please see separate  dictation for contemporaneously obtained CTA chest, abdomen and pelvis dated 03/31/2019 for full description of relevant extracardiac findings. Electronically Signed: By: Vinnie Langton M.D. On: 03/31/2019 13:12   DG Chest Port 1 View  Result Date: 04/13/2019 CLINICAL DATA:  Post TAVR EXAM: PORTABLE CHEST 1 VIEW COMPARISON:  04/09/2019 FINDINGS: Changes of aortic valve repair. Heart is borderline in size. Left base atelectasis and possible small left effusion. No pneumothorax. IMPRESSION: Aortic valve repair changes. Borderline heart size with left base atelectasis and possible layering left effusion. Electronically Signed   By: Rolm Baptise M.D.   On: 04/13/2019 17:25   CT ANGIO CHEST AORTA W &/OR WO CONTRAST  Result Date: 03/31/2019 CLINICAL DATA:  84 year old male with history of aortic valve stenosis. Preprocedural study prior to potential transcatheter aortic valve replacement (TAVR) procedure. EXAM: CT ANGIOGRAPHY CHEST, ABDOMEN AND PELVIS TECHNIQUE: Multidetector CT imaging through the chest, abdomen and pelvis was performed using the standard protocol during bolus administration of intravenous contrast. Multiplanar reconstructed images and MIPs were obtained and reviewed to evaluate the vascular anatomy. CONTRAST:  182m OMNIPAQUE IOHEXOL 350 MG/ML SOLN COMPARISON:  Cardiac CTA 08/13/2011. FINDINGS: CTA CHEST FINDINGS Cardiovascular: Heart size is mildly enlarged with left atrial dilatation. There is no significant pericardial fluid, thickening or pericardial calcification. Tiny filling defect in the tip of the left atrial appendage, concerning for potential left atrial appendage thrombus. There is aortic atherosclerosis, as well as atherosclerosis of the great vessels of the mediastinum and the coronary arteries, including calcified atherosclerotic plaque in the left anterior descending, left circumflex and right coronary arteries. Severe thickening calcification of the aortic valve.  Mediastinum/Lymph Nodes: No pathologically enlarged mediastinal or hilar lymph nodes. Esophagus is unremarkable in appearance. No axillary lymphadenopathy. Lungs/Pleura: Moderate to large bilateral pleural effusions lying dependently with associated areas of passive subsegmental atelectasis in the lower lobes of the lungs bilaterally. No acute consolidative airspace disease. No suspicious appearing pulmonary nodules or masses are noted. Musculoskeletal/Soft Tissues: There are no aggressive appearing lytic or blastic lesions noted in the visualized portions of the skeleton. CTA ABDOMEN  AND PELVIS FINDINGS Hepatobiliary: No suspicious cystic or solid hepatic lesions. No intra or extrahepatic biliary ductal dilatation. Gallbladder is unremarkable in appearance. Pancreas: No pancreatic mass. No pancreatic ductal dilatation. No pancreatic or peripancreatic fluid collections or inflammatory changes. Spleen: Well-defined 2.7 cm intermediate attenuation lesion in the spleen, incompletely characterized, but favored to represent a benign lesion. Adrenals/Urinary Tract: Subcentimeter low-attenuation lesions in the left kidney, too small to characterize, but statistically likely to represent tiny cysts. Right kidney and bilateral adrenal glands are normal in appearance. No hydroureteronephrosis. Urinary bladder is normal in appearance. Stomach/Bowel: Normal appearance of the stomach. No pathologic dilatation of small bowel or colon. A few scattered colonic diverticulae are noted, without surrounding inflammatory changes to suggest an acute diverticulitis at this time. The appendix is not confidently identified and may be surgically absent. Regardless, there are no inflammatory changes noted adjacent to the cecum to suggest the presence of an acute appendicitis at this time. Vascular/Lymphatic: Aortic atherosclerosis, without evidence of aneurysm or dissection in the abdominal or pelvic vasculature. Vascular findings and  measurements pertinent to potential TAVR procedure, as detailed below. Retroaortic left renal vein (normal anatomical variant) incidentally noted. No lymphadenopathy noted in the abdomen or pelvis. Reproductive: Prostate gland is enlarged. Seminal vesicles are unremarkable in appearance. Other: No significant volume of ascites.  No pneumoperitoneum. Musculoskeletal: There are no aggressive appearing lytic or blastic lesions noted in the visualized portions of the skeleton. VASCULAR MEASUREMENTS PERTINENT TO TAVR: AORTA: Minimal Aortic Diameter-8 x 15 mm Severity of Aortic Calcification-severe RIGHT PELVIS: Right Common Iliac Artery - Minimal Diameter-10.4 x 9.3 mm Tortuosity-mild Calcification - mild Right External Iliac Artery - Minimal Diameter-8.9 x 8.0 mm Tortuosity-moderate to severe Calcification - mild Right Common Femoral Artery - Minimal Diameter-9.3 x 7.7 mm Tortuosity-mild Calcification - mild LEFT PELVIS: Left Common Iliac Artery - Minimal Diameter-9.9 x 8.7 mm Tortuosity-mild Calcification - mild Left External Iliac Artery - Minimal Diameter-8.6 x 8.0 mm Tortuosity-moderate Calcification-none Left Common Femoral Artery - Minimal Diameter-8.7 x 8.3 mm Tortuosity-mild Calcification - mild Review of the MIP images confirms the above findings. IMPRESSION: 1. Vascular findings and measurements pertinent to potential TAVR procedure, as detailed above. 2. Severe thickening calcification of the aortic valve, compatible with the reported clinical history of severe aortic stenosis. 3. Filling defect in the tip of the left atrial appendage. Given the cardiomegaly with left atrial dilatation, this is concerning for left atrial appendage thrombus, but could simply represent "pseudo thrombus" related to incomplete mixing of contrast material. Attention at time of forthcoming transesophageal echocardiography is suggested. 4. Aortic atherosclerosis, in addition to 3 vessel coronary artery disease. 5. Colonic  diverticulosis without evidence of acute diverticulitis at this time. 6. Additional incidental findings, as above. Electronically Signed   By: Vinnie Langton M.D.   On: 03/31/2019 13:59   VAS US CAROTID  Result Date: 03/31/2019 Carotid Arterial Duplex Study Indications: Pre op TAVR clearance. Performing Technologist: Ralene Cork RVT  Examination Guidelines: A complete evaluation includes B-mode imaging, spectral Doppler, color Doppler, and power Doppler as needed of all accessible portions of each vessel. Bilateral testing is considered an integral part of a complete examination. Limited examinations for reoccurring indications may be performed as noted.  Right Carotid Findings: +----------+--------+--------+--------+------------------+--------+             PSV cm/s EDV cm/s Stenosis Plaque Description Comments  +----------+--------+--------+--------+------------------+--------+  CCA Prox   99       19                                             +----------+--------+--------+--------+------------------+--------+  CCA Mid    85       23                                             +----------+--------+--------+--------+------------------+--------+  CCA Distal 75       23                heterogenous                 +----------+--------+--------+--------+------------------+--------+  ICA Prox   83       31       1-39%    heterogenous                 +----------+--------+--------+--------+------------------+--------+  ICA Mid    101      43                                             +----------+--------+--------+--------+------------------+--------+  ICA Distal 93       36                                             +----------+--------+--------+--------+------------------+--------+  ECA        140      27                calcific                     +----------+--------+--------+--------+------------------+--------+ +----------+--------+-------+----------------+-------------------+             PSV cm/s EDV  cms Describe         Arm Pressure (mmHG)  +----------+--------+-------+----------------+-------------------+  Subclavian 156              Multiphasic, WNL                      +----------+--------+-------+----------------+-------------------+ +---------+--------+--+--------+-+---------+  Vertebral PSV cm/s 35 EDV cm/s 9 Antegrade  +---------+--------+--+--------+-+---------+  Left Carotid Findings: +----------+--------+--------+--------+------------------+--------+             PSV cm/s EDV cm/s Stenosis Plaque Description Comments  +----------+--------+--------+--------+------------------+--------+  CCA Prox   80       19                                             +----------+--------+--------+--------+------------------+--------+  CCA Mid    77       27                                             +----------+--------+--------+--------+------------------+--------+  CCA Distal 90       27                                             +----------+--------+--------+--------+------------------+--------+  ICA Prox   70  26       1-39%    heterogenous                 +----------+--------+--------+--------+------------------+--------+  ICA Mid    78       34                                             +----------+--------+--------+--------+------------------+--------+  ICA Distal 89       39                                             +----------+--------+--------+--------+------------------+--------+  ECA        116      24                heterogenous                 +----------+--------+--------+--------+------------------+--------+ +----------+--------+--------+----------------+-------------------+             PSV cm/s EDV cm/s Describe         Arm Pressure (mmHG)  +----------+--------+--------+----------------+-------------------+  Subclavian 130               Multiphasic, WNL                      +----------+--------+--------+----------------+-------------------+ +---------+--------+--+--------+--+---------+   Vertebral PSV cm/s 46 EDV cm/s 13 Antegrade  +---------+--------+--+--------+--+---------+  Summary: Right Carotid: Velocities in the right ICA are consistent with a 1-39% stenosis. Left Carotid: Velocities in the left ICA are consistent with a 1-39% stenosis. Vertebrals:  Bilateral vertebral arteries demonstrate antegrade flow. Subclavians: Normal flow hemodynamics were seen in bilateral subclavian              arteries. *See table(s) above for measurements and observations.  Electronically signed by Monica Martinez MD on 03/31/2019 at 5:33:52 PM.    Final    ECHOCARDIOGRAM LIMITED  Result Date: 04/13/2019   ECHOCARDIOGRAM LIMITED REPORT   Patient Name:   CHRISTIAN BORGERDING Date of Exam: 04/13/2019 Medical Rec #:  [id number]        Height:       74.0 in Accession #:    [id number]       Weight:       214.2 lb Date of Birth:  21-Sep-1935        BSA:          2.24 m Patient Age:    42 years         BP:           141/82 mmHg Patient Gender: M                HR:           72 bpm. Exam Location:  Inpatient  Procedure: Limited Echo, Color Doppler and Cardiac Doppler Indications:    I35.0 Nonrheumatic aortic (valve) stenosis, TAVR Procedure  History:        Patient has prior history of Echocardiogram examinations, most                 recent 01/20/2019.  Sonographer:    Raquel Sarna Senior RDCS Referring Phys: [place]  Sonographer Comments: 31m Edwards Ultra Sapien Kit TAVR FINDINGS PRE-PROCEDURAL FINDINGS: Normal left ventricular  systolic function. Estimated LVEF 60-65%. There are no regional wall motion abnormalities. Severe calcific aortic stenosis. Probably trileaflet aortic valve. Peak aortic valve gradient 65 mm Hg, mean gradient 37 mm Hg. Dimensionless obstructive index 0.28, calculated aortic valve area is 0.97 cm sq. Mild mitral insufficiency. Trivial pericardial effusion. POST-PROCEDURAL FINDINGS: Hyperdynamic left ventricular systolic function. Estimated LVEF 75%. There are no regional wall motion  abnormalities. Severe calcific aortic stenosis. Trileaflet aortic valve. Peak aortic valve gradient 11 mm Hg, mean gradient 6 mm Hg. Dimensionless obstructive index 0.68, calculated aortic valve area is 2.34 cm. There is no aortic insufficiency and no perivalvular leak. Unchanged trivial pericardial effusion and mild mitral insufficiency. Aortic Valve: Aortic valve mean gradient measures 6.0 mmHg. Aortic valve peak gradient measures 10.8 mmHg. Aortic valve area, by VTI measures 2.34 cm.  LEFT VENTRICLE          Normals PLAX 2D LVOT diam:     2.10 cm LVOT Area:     3.46 cm  AORTIC VALVE                    Normals AV Area (Vmax):    2.45 cm AV Area (Vmean):   2.44 cm AV Area (VTI):     2.34 cm AV Vmax:           164.00 cm/s AV Vmean:          114.000 cm/s AV VTI:            0.377 m AV Peak Grad:      10.8 mmHg AV Mean Grad:      6.0 mmHg LVOT Vmax:         116.00 cm/s LVOT Vmean:        80.300 cm/s LVOT VTI:          0.255 m LVOT/AV VTI ratio: 0.68  SHUNTS Systemic VTI:  0.26 m Systemic Diam: 2.10 cm  Sanda Klein MD Electronically signed by Sanda Klein MD Signature Date/Time: 04/13/2019/6:46:55 PM    Final    Structural Heart Procedure  Result Date: 04/13/2019 See surgical note for result.  CT Angio Abd/Pel w/ and/or w/o  Result Date: 03/31/2019 CLINICAL DATA:  84 year old male with history of aortic valve stenosis. Preprocedural study prior to potential transcatheter aortic valve replacement (TAVR) procedure. EXAM: CT ANGIOGRAPHY CHEST, ABDOMEN AND PELVIS TECHNIQUE: Multidetector CT imaging through the chest, abdomen and pelvis was performed using the standard protocol during bolus administration of intravenous contrast. Multiplanar reconstructed images and MIPs were obtained and reviewed to evaluate the vascular anatomy. CONTRAST:  160m OMNIPAQUE IOHEXOL 350 MG/ML SOLN COMPARISON:  Cardiac CTA 08/13/2011. FINDINGS: CTA CHEST FINDINGS Cardiovascular: Heart size is mildly enlarged with left atrial  dilatation. There is no significant pericardial fluid, thickening or pericardial calcification. Tiny filling defect in the tip of the left atrial appendage, concerning for potential left atrial appendage thrombus. There is aortic atherosclerosis, as well as atherosclerosis of the great vessels of the mediastinum and the coronary arteries, including calcified atherosclerotic plaque in the left anterior descending, left circumflex and right coronary arteries. Severe thickening calcification of the aortic valve. Mediastinum/Lymph Nodes: No pathologically enlarged mediastinal or hilar lymph nodes. Esophagus is unremarkable in appearance. No axillary lymphadenopathy. Lungs/Pleura: Moderate to large bilateral pleural effusions lying dependently with associated areas of passive subsegmental atelectasis in the lower lobes of the lungs bilaterally. No acute consolidative airspace disease. No suspicious appearing pulmonary nodules or masses are noted. Musculoskeletal/Soft Tissues: There are no aggressive appearing lytic  or blastic lesions noted in the visualized portions of the skeleton. CTA ABDOMEN AND PELVIS FINDINGS Hepatobiliary: No suspicious cystic or solid hepatic lesions. No intra or extrahepatic biliary ductal dilatation. Gallbladder is unremarkable in appearance. Pancreas: No pancreatic mass. No pancreatic ductal dilatation. No pancreatic or peripancreatic fluid collections or inflammatory changes. Spleen: Well-defined 2.7 cm intermediate attenuation lesion in the spleen, incompletely characterized, but favored to represent a benign lesion. Adrenals/Urinary Tract: Subcentimeter low-attenuation lesions in the left kidney, too small to characterize, but statistically likely to represent tiny cysts. Right kidney and bilateral adrenal glands are normal in appearance. No hydroureteronephrosis. Urinary bladder is normal in appearance. Stomach/Bowel: Normal appearance of the stomach. No pathologic dilatation of small bowel  or colon. A few scattered colonic diverticulae are noted, without surrounding inflammatory changes to suggest an acute diverticulitis at this time. The appendix is not confidently identified and may be surgically absent. Regardless, there are no inflammatory changes noted adjacent to the cecum to suggest the presence of an acute appendicitis at this time. Vascular/Lymphatic: Aortic atherosclerosis, without evidence of aneurysm or dissection in the abdominal or pelvic vasculature. Vascular findings and measurements pertinent to potential TAVR procedure, as detailed below. Retroaortic left renal vein (normal anatomical variant) incidentally noted. No lymphadenopathy noted in the abdomen or pelvis. Reproductive: Prostate gland is enlarged. Seminal vesicles are unremarkable in appearance. Other: No significant volume of ascites.  No pneumoperitoneum. Musculoskeletal: There are no aggressive appearing lytic or blastic lesions noted in the visualized portions of the skeleton. VASCULAR MEASUREMENTS PERTINENT TO TAVR: AORTA: Minimal Aortic Diameter-8 x 15 mm Severity of Aortic Calcification-severe RIGHT PELVIS: Right Common Iliac Artery - Minimal Diameter-10.4 x 9.3 mm Tortuosity-mild Calcification - mild Right External Iliac Artery - Minimal Diameter-8.9 x 8.0 mm Tortuosity-moderate to severe Calcification - mild Right Common Femoral Artery - Minimal Diameter-9.3 x 7.7 mm Tortuosity-mild Calcification - mild LEFT PELVIS: Left Common Iliac Artery - Minimal Diameter-9.9 x 8.7 mm Tortuosity-mild Calcification - mild Left External Iliac Artery - Minimal Diameter-8.6 x 8.0 mm Tortuosity-moderate Calcification-none Left Common Femoral Artery - Minimal Diameter-8.7 x 8.3 mm Tortuosity-mild Calcification - mild Review of the MIP images confirms the above findings. IMPRESSION: 1. Vascular findings and measurements pertinent to potential TAVR procedure, as detailed above. 2. Severe thickening calcification of the aortic valve,  compatible with the reported clinical history of severe aortic stenosis. 3. Filling defect in the tip of the left atrial appendage. Given the cardiomegaly with left atrial dilatation, this is concerning for left atrial appendage thrombus, but could simply represent "pseudo thrombus" related to incomplete mixing of contrast material. Attention at time of forthcoming transesophageal echocardiography is suggested. 4. Aortic atherosclerosis, in addition to 3 vessel coronary artery disease. 5. Colonic diverticulosis without evidence of acute diverticulitis at this time. 6. Additional incidental findings, as above. Electronically Signed   By: Vinnie Langton M.D.   On: 03/31/2019 13:59   Disposition   Pt is being discharged home today in good condition.  Follow-up Plans & Appointments    Follow-up Information    Eileen Stanford, PA-C. Go on 04/21/2019.   Specialties: Cardiology, Radiology Why: @ 4pm, please arrive at least 10 minutes early. Contact information: [place] [id number] [phone]            Discharge Medications   Allergies as of 04/14/2019      Reactions   Ace Inhibitors Anaphylaxis, Swelling   Face and lip swelling   Benazepril Anaphylaxis, Swelling  Face and lip swelling   Codeine Itching, Rash   Statins Other (See Comments)   Made very sick, doctor thought had heart attack; "my appearance, color, etc". Tolerating a low dose currently   Piroxicam Hives   Pseudoephedrine Other (See Comments)   Can't take because it interacts with dofetilide       Medication List    TAKE these medications   APPLE CIDER VINEGAR PO Take 1 capsule by mouth daily.   aspirin 81 MG chewable tablet Chew 1 tablet (81 mg total) by mouth daily. Start taking on: April 15, 2019   Biotin 10000 MCG Tabs Take 10,000 mcg by mouth daily.   CINNAMON PO Take 2,000 mg by mouth 2 (two) times daily.   [employer] CoQ-10 200 MG capsule Generic drug: Coenzyme  Q10 Take 200 mg by mouth every evening.   dasatinib 100 MG tablet Commonly known as: SPRYCEL Take 100 mg by mouth every evening.   Eliquis 5 MG Tabs tablet Generic drug: apixaban TAKE 1 TABLET BY MOUTH TWICE A DAY What changed: how much to take   ergocalciferol 1.25 MG (50000 UT) capsule Commonly known as: VITAMIN D2 Take 50,000 Units by mouth every Monday.   famotidine 20 MG tablet Commonly known as: PEPCID Take 20 mg by mouth 2 (two) times daily before a meal.   fenofibrate 145 MG tablet Commonly known as: TRICOR TAKE 1 TABLET BY MOUTH EVERY DAY   ferrous sulfate 325 (65 FE) MG tablet Take 325 mg by mouth every evening.   finasteride 5 MG tablet Commonly known as: PROSCAR Take 5 mg by mouth every evening.   levothyroxine 200 MCG tablet Commonly known as: SYNTHROID Take 200 mcg by mouth daily before breakfast.   metolazone 2.5 MG tablet Commonly known as: ZAROXOLYN Take only as directed by CHF clinic What changed:   how much to take  how to take this  when to take this  reasons to take this  additional instructions   PARoxetine 40 MG tablet Commonly known as: PAXIL Take 1 tablet (40 mg total) by mouth every morning.   potassium chloride SA 20 MEQ tablet Commonly known as: Klor-Con M20 Take 1 tablet (20 mEq total) by mouth every evening. What changed: See the new instructions.   protein supplement shake Liqd Commonly known as: PREMIER PROTEIN Take 2 oz by mouth daily.   rosuvastatin 20 MG tablet Commonly known as: CRESTOR Take 10 mg by mouth daily. What changed: Another medication with the same name was removed. Continue taking this medication, and follow the directions you see here.   spironolactone 25 MG tablet Commonly known as: ALDACTONE Take 0.5 tablets (12.5 mg total) by mouth daily.   torsemide 20 MG tablet Commonly known as: DEMADEX Take 1 tablet (20 mg total) by mouth daily. May take and additional tab in PM What changed: additional  instructions   traZODone 50 MG tablet Commonly known as: DESYREL Take 50 mg by mouth at bedtime.   TURMERIC PO Take 1,000 mg by mouth every evening.         Outstanding Labs/Studies   None   Duration of Discharge Encounter   Greater than 30 minutes including physician time.  SignedAngelena Form, PA-C 04/14/2019, 11:01 AM [phone]  Patient seen, examined. Available data reviewed. Agree with findings, assessment, and plan as outlined by Nell Range, PA-C.  The patient is independently interviewed and examined.  He is doing very well, postoperative day #1 from TAVR.  On my exam, he  is alert, oriented, in no distress.  Lungs are clear but diminished in the bases, JVP is normal, heart is regular rate and rhythm with a soft 1/6 systolic murmur at the right upper sternal border, no diastolic murmur, bilateral groin sites are clear, extremities have trace pretibial edema.  The patient's echo is personally reviewed and shows normal function of his transcatheter heart valve with normal gradients and no paravalvular regurgitation.  The patient does have what appear to be significant pleural effusions.  He is breathing comfortably on room air and prefers to continue with medical therapy rather than undergoing thoracentesis.  We will order a follow-up x-ray after discharge when he presents for follow-up evaluation.  Post TAVR instructions are given.  Sherren Mocha, M.D. 04/14/2019 12:28 PM

## 2019-04-14 NOTE — Progress Notes (Signed)
CARDIAC REHAB PHASE I   PRE:  Rate/Rhythm: 83 afib  BP:  Supine: 132/64  Sitting:   Standing:    SaO2: 99%RA  MODE:  Ambulation: 470 ft   POST:  Rate/Rhythm: 103 afib  BP:  Supine: 146/72  Sitting:   Standing:    SaO2: 92%RA 0915-1005 Pt walked 470 ft on RA with rolling walker independently with steady gait. Has walker at home if needed. To bed after walk. Pt given walking instructions for ex. Instructed not to use stationary bike until seen by MD and permission given. Pt has been following diabetic diet. Not interested in CRP 2 referral or follow up.   Graylon Good, RN BSN  04/14/2019 10:02 AM

## 2019-04-14 NOTE — Progress Notes (Signed)
  Echocardiogram 2D Echocardiogram has been performed.  [name] White 04/14/2019, 8:46 AM

## 2019-04-15 ENCOUNTER — Telehealth: Payer: Self-pay

## 2019-04-15 ENCOUNTER — Other Ambulatory Visit: Payer: Self-pay | Admitting: Physician Assistant

## 2019-04-15 LAB — ECHOCARDIOGRAM COMPLETE
Height: 74 in
Weight: 3625.6 oz

## 2019-04-15 MED ORDER — PREDNISONE 10 MG (21) PO TBPK: ORAL_TABLET | ORAL | 0 refills | Status: DC

## 2019-04-15 NOTE — Telephone Encounter (Signed)
Patient contacted regarding discharge from [facility] on 04/14/2019.  Patient understands to follow up with provider Nell Range PA-C on 04/21/2019 at 4:00 PM at [facility] location. Patient understands discharge instructions? yes Patient understands medications and regiment? yes Patient understands to bring all medications to this visit? yes  The pt is doing well after TAVR with the exception of developing a red rash and hives on his chest and groin area.  The pt said from the base of his neck to mid waist and on his groins his skin is red like a sun burn and he has hives.  The pt has taken 3 benadryl since he left the hospital due to itching.  The pt states that he was itching prior to discharge and when the nurse cleaned his skin the wipe was a reddish color. It appears the rash is related to the betadine that was placed on the pt's skin at time of TAVR.  I have added betadine to the pt's allergies.  Pt's symptoms discussed with Nell Range PA-C and she has sent in a prednisone taper for the pt to take for symptom improvement.  The pt will contact the office with any additional questions or concerns.

## 2019-04-19 DIAGNOSIS — H401131 Primary open-angle glaucoma, bilateral, mild stage: Secondary | ICD-10-CM | POA: Diagnosis not present

## 2019-04-20 ENCOUNTER — Other Ambulatory Visit: Payer: Self-pay

## 2019-04-20 ENCOUNTER — Ambulatory Visit (INDEPENDENT_AMBULATORY_CARE_PROVIDER_SITE_OTHER): Payer: Medicare Other | Admitting: Internal Medicine

## 2019-04-20 ENCOUNTER — Encounter: Payer: Self-pay | Admitting: Internal Medicine

## 2019-04-20 DIAGNOSIS — E669 Obesity, unspecified: Secondary | ICD-10-CM

## 2019-04-20 DIAGNOSIS — E1169 Type 2 diabetes mellitus with other specified complication: Secondary | ICD-10-CM

## 2019-04-20 DIAGNOSIS — N5089 Other specified disorders of the male genital organs: Secondary | ICD-10-CM

## 2019-04-20 DIAGNOSIS — I10 Essential (primary) hypertension: Secondary | ICD-10-CM

## 2019-04-20 DIAGNOSIS — F418 Other specified anxiety disorders: Secondary | ICD-10-CM

## 2019-04-20 DIAGNOSIS — C9210 Chronic myeloid leukemia, BCR/ABL-positive, not having achieved remission: Secondary | ICD-10-CM

## 2019-04-20 DIAGNOSIS — I350 Nonrheumatic aortic (valve) stenosis: Secondary | ICD-10-CM | POA: Diagnosis not present

## 2019-04-20 NOTE — Assessment & Plan Note (Signed)
Pt lost 70 lbs of wt -- 2020

## 2019-04-20 NOTE — Assessment & Plan Note (Signed)
[facility]c On Dasatinib po

## 2019-04-20 NOTE — Assessment & Plan Note (Signed)
Torsemide, Spironolactone, KCl

## 2019-04-20 NOTE — Assessment & Plan Note (Addendum)
likely due to CHF: Scrotum, pubic fat, foreskin - swollen Torsemide 40 mg in am Take another 20 mg with lunch if the swelling is worse Use athletic boxers or briefs

## 2019-04-20 NOTE — Patient Instructions (Signed)
Torsemide 40 mg in am Take another 20 mg with lunch if the swelling is worse Use athletic boxers or briefs

## 2019-04-20 NOTE — Progress Notes (Signed)
Subjective:  Patient ID: [name] White, male    DOB: [birth year]  Age: 84 y.o. MRN: [id number]  CC: No chief complaint on file.   HPI [name] White presents for CHF, CAD, TAVR last wk 1/12 , dyslipidemia f/u C/o side effects w/Disatinib - hoarse, weak C/o genital swelling x 2 wks -- ?better now  Pt lost 70 lbs in the past year  Per Hx: "He has been diagnosed with CML and he is undergoing treatment through the [facility]).  He also tore his rotator cuff earlier this year.  His cardiac hx dates back many years with atrial arrhythmias. He underwent flutter ablation in 2010 by Dr Lovena Le. He later developed atrial fibrillation and was managed with Tikosyn and Apixaban. He has subsequently developed permanent atrial fibrillation and is no longer on Tikosyn. He's been followed by Dr Haroldine Laws who has been regularly following the patient's AS with serial echo studies. The patient has developed worsening shortness of breath in October of 2020 and ultimately went to [facility] ER 03/09/2019 with marked dyspnea. He was short of breath with conversation and minimal activity. He has had orthopnea but denies PND. He was given IV lasix with an excellent response and he saw Dr Haroldine Laws the following day. He was transitioned to torsemide and he's doing much better now.  He denies chest pain or pressure.  He denies lightheadedness or syncope."  Cardiology records reviewed   Outpatient Medications Prior to Visit  Medication Sig Dispense Refill  . APPLE CIDER VINEGAR PO Take 1 capsule by mouth daily.    [name] aspirin 81 MG chewable tablet Chew 1 tablet (81 mg total) by mouth daily.    . Biotin 10000 MCG TABS Take 10,000 mcg by mouth daily.     [name] CINNAMON PO Take 2,000 mg by mouth 2 (two) times daily.     . Coenzyme Q10 ([employer] COQ-10) 200 MG capsule Take 200 mg by mouth every evening.     . dasatinib (SPRYCEL) 100 MG tablet Take 100 mg by mouth every evening.     [name] ELIQUIS 5 MG TABS tablet TAKE 1  TABLET BY MOUTH TWICE A DAY (Patient taking differently: Take 5 mg by mouth 2 (two) times daily. ) 180 tablet 3  . ergocalciferol (VITAMIN D2) 1.25 MG (50000 UT) capsule Take 50,000 Units by mouth every Monday.    . famotidine (PEPCID) 20 MG tablet Take 20 mg by mouth 2 (two) times daily before a meal.    . fenofibrate (TRICOR) 145 MG tablet TAKE 1 TABLET BY MOUTH EVERY DAY 90 tablet 1  . ferrous sulfate 325 (65 FE) MG tablet Take 325 mg by mouth every evening.     . finasteride (PROSCAR) 5 MG tablet Take 5 mg by mouth every evening.     [name] levothyroxine (SYNTHROID) 200 MCG tablet Take 200 mcg by mouth daily before breakfast.    . metolazone (ZAROXOLYN) 2.5 MG tablet Take only as directed by CHF clinic (Patient taking differently: Take 2.5 mg by mouth daily as needed (swelling). Contact dr before taking) 10 tablet 0  . PARoxetine (PAXIL) 40 MG tablet Take 1 tablet (40 mg total) by mouth every morning. 90 tablet 1  . potassium chloride SA (KLOR-CON M20) 20 MEQ tablet Take 1 tablet (20 mEq total) by mouth every evening.    . predniSONE (STERAPRED UNI-PAK 21 TAB) 10 MG (21) TBPK tablet Take 6 tablets (60 mg total) by mouth daily for 1 day,  THEN 5 tablets (50 mg total) daily for 1 day, THEN 4 tablets (40 mg total) daily for 1 day, THEN 3 tablets (30 mg total) daily for 1 day, THEN 2 tablets (20 mg total) daily for 1 day, THEN 1 tablet (10 mg total) daily for 1 day. 21 tablet 0  . protein supplement shake (PREMIER PROTEIN) LIQD Take 2 oz by mouth daily.    . rosuvastatin (CRESTOR) 20 MG tablet Take 10 mg by mouth daily.    [name] spironolactone (ALDACTONE) 25 MG tablet Take 0.5 tablets (12.5 mg total) by mouth daily. 45 tablet 3  . torsemide (DEMADEX) 20 MG tablet Take 1 tablet (20 mg total) by mouth daily. May take and additional tab in PM (Patient taking differently: Take 20 mg by mouth daily. ) 60 tablet 3  . traZODone (DESYREL) 50 MG tablet Take 50 mg by mouth at bedtime.    . TURMERIC PO Take 1,000 mg by  mouth every evening.      No facility-administered medications prior to visit.    ROS: Review of Systems  Constitutional: Positive for fatigue and unexpected weight change. Negative for appetite change.  HENT: Positive for voice change. Negative for congestion, nosebleeds, sneezing and trouble swallowing.   Eyes: Negative for itching and visual disturbance.  Respiratory: Negative for cough.   Cardiovascular: Negative for chest pain, palpitations and leg swelling.  Gastrointestinal: Negative for abdominal distention, blood in stool, diarrhea and nausea.  Genitourinary: Negative for frequency and hematuria.  Musculoskeletal: Positive for gait problem. Negative for back pain, joint swelling and neck pain.  Skin: Negative for rash.  Neurological: Negative for dizziness, tremors, speech difficulty and weakness.  Psychiatric/Behavioral: Negative for agitation, dysphoric mood and sleep disturbance. The patient is not nervous/anxious.     Objective:  BP 132/74 (BP Location: Left Arm, Patient Position: Sitting, Cuff Size: Large)   Pulse 77   Temp 99 F (37.2 C) (Oral)   Ht _0  (1.88 m)   Wt 218 lb (98.9 kg)   SpO2 97%   BMI 27.99 kg/m   BP Readings from Last 3 Encounters:  04/20/19 132/74  04/14/19 121/60  04/09/19 127/63    Wt Readings from Last 3 Encounters:  04/20/19 218 lb (98.9 kg)  04/14/19 226 lb 9.6 oz (102.8 kg)  04/09/19 214 lb 4 oz (97.2 kg)    Physical Exam Constitutional:      General: He is not in acute distress.    Appearance: He is well-developed.     Comments: NAD  Eyes:     Conjunctiva/sclera: Conjunctivae normal.     Pupils: Pupils are equal, round, and reactive to light.  Neck:     Thyroid: No thyromegaly.     Vascular: No JVD.  Cardiovascular:     Rate and Rhythm: Normal rate and regular rhythm.     Heart sounds: Normal heart sounds. No murmur. No friction rub. No gallop.   Pulmonary:     Effort: Pulmonary effort is normal. No respiratory  distress.     Breath sounds: Normal breath sounds. No wheezing or rales.  Chest:     Chest wall: No tenderness.  Abdominal:     General: Bowel sounds are normal. There is no distension.     Palpations: Abdomen is soft. There is no mass.     Tenderness: There is no abdominal tenderness. There is no guarding or rebound.  Musculoskeletal:        General: No tenderness. Normal range of motion.  Cervical back: Normal range of motion.  Lymphadenopathy:     Cervical: No cervical adenopathy.  Skin:    General: Skin is warm and dry.     Findings: No rash.  Neurological:     Mental Status: He is alert and oriented to person, place, and time.     Cranial Nerves: No cranial nerve deficit.     Motor: No abnormal muscle tone.     Coordination: Coordination normal.     Gait: Gait normal.     Deep Tendon Reflexes: Reflexes are normal and symmetric.  Psychiatric:        Behavior: Behavior normal.        Thought Content: Thought content normal.        Judgment: Judgment normal.   scars - healing in the groin Scrotum, pubic fat, foreskin - swollen Time >45 min - a complex case  Lab Results  Component Value Date   WBC 4.9 04/14/2019   HGB 10.5 (L) 04/14/2019   HCT 32.7 (L) 04/14/2019   PLT 106 (L) 04/14/2019   GLUCOSE 118 (H) 04/14/2019   CHOL 171 01/22/2018   TRIG 98 01/22/2018   HDL 57 01/22/2018   LDLDIRECT 141.6 11/12/2010   LDLCALC 94 01/22/2018   ALT 25 04/09/2019   AST 44 (H) 04/09/2019   NA 137 04/14/2019   K 3.7 04/14/2019   CL 107 04/14/2019   CREATININE 1.02 04/14/2019   BUN 23 04/14/2019   CO2 24 04/14/2019   TSH 0.86 01/10/2017   PSA 1.42 09/13/2013   INR 1.1 04/09/2019   HGBA1C 6.2 (H) 04/09/2019   MICROALBUR 6.0 (H) 01/10/2017    DG Chest Port 1 View  Result Date: 04/13/2019 CLINICAL DATA:  Post TAVR EXAM: PORTABLE CHEST 1 VIEW COMPARISON:  04/09/2019 FINDINGS: Changes of aortic valve repair. Heart is borderline in size. Left base atelectasis and possible  small left effusion. No pneumothorax. IMPRESSION: Aortic valve repair changes. Borderline heart size with left base atelectasis and possible layering left effusion. Electronically Signed   By: Rolm Baptise M.D.   On: 04/13/2019 17:25   ECHOCARDIOGRAM COMPLETE  Result Date: 04/15/2019   ECHOCARDIOGRAM REPORT   Patient Name:   FERGUSON GERTNER Date of Exam: 04/14/2019 Medical Rec #:  [id number]        Height:       74.0 in Accession #:    [id number]       Weight:       226.6 lb Date of Birth:  01/14/1936        BSA:          2.29 m Patient Age:    54 years         BP:           103/56 mmHg Patient Gender: M                HR:           81 bpm. Exam Location:  Inpatient Procedure: 2D Echo, Cardiac Doppler and Color Doppler Indications:    post TAVR evaluation  History:        Patient has prior history of Echocardiogram examinations, most                 recent 04/13/2019. CHF, Aortic Valve Disease, Arrythmias:Atrial                 Fibrillation; Risk Factors:Diabetes. Aortic Valve: A 26 Edwards  Edwards Sapien bioprosthetic, stented aortic valve (TAVR)                 Procedure Date: 04/13/2019  Sonographer:    Dustin Flock Referring Phys: [id number] [place] IMPRESSIONS  1. Left ventricular ejection fraction, by visual estimation, is 60 to 65%. The left ventricle has normal function. There is moderately increased left ventricular hypertrophy.  2. Left ventricular diastolic function could not be evaluated.  3. The left ventricle has no regional wall motion abnormalities.  4. Global right ventricle has normal systolic function.The right ventricular size is normal. No increase in right ventricular wall thickness.  5. Left atrial size was severely dilated.  6. Right atrial size was mild-moderately dilated.  7. Small pericardial effusion.  8. The pericardial effusion is circumferential.  9. Mild mitral annular calcification. 10. The mitral valve is normal in structure. Trivial mitral valve  regurgitation. 11. The tricuspid valve is normal in structure. 12. Aortic stent-valve prosthesis is well-seated, without perivalvular leak. 13. Aortic valve mean gradient measures 12.5 mmHg. 14. Aortic valve peak gradient measures 21.4 mmHg. 15. Aortic valve regurgitation is not visualized. 16. The pulmonic valve was not well visualized. Pulmonic valve regurgitation is not visualized. 17. Mildly elevated pulmonary artery systolic pressure. 18. The tricuspid regurgitant velocity is 2.81 m/s, and with an assumed right atrial pressure of 15 mmHg, the estimated right ventricular systolic pressure is mildly elevated at 46.6 mmHg. 19. The inferior vena cava is dilated in size with <50% respiratory variability, suggesting right atrial pressure of 15 mmHg. FINDINGS  Left Ventricle: Left ventricular ejection fraction, by visual estimation, is 60 to 65%. The left ventricle has normal function. The left ventricle has no regional wall motion abnormalities. The left ventricular internal cavity size was the left ventricle is normal in size. There is moderately increased left ventricular hypertrophy. Concentric left ventricular hypertrophy. The left ventricular diastology could not be evaluated due to atrial fibrillation. Left ventricular diastolic function could  not be evaluated. Right Ventricle: The right ventricular size is normal. No increase in right ventricular wall thickness. Global RV systolic function is has normal systolic function. The tricuspid regurgitant velocity is 2.81 m/s, and with an assumed right atrial pressure  of 15 mmHg, the estimated right ventricular systolic pressure is mildly elevated at 46.6 mmHg. Left Atrium: Left atrial size was severely dilated. Right Atrium: Right atrial size was mild-moderately dilated Pericardium: A small pericardial effusion is present. The pericardial effusion is circumferential. Mitral Valve: The mitral valve is normal in structure. Mild mitral annular calcification. Trivial  mitral valve regurgitation. Tricuspid Valve: The tricuspid valve is normal in structure. Tricuspid valve regurgitation is not demonstrated. Aortic Valve: The aortic valve has been repaired/replaced. Aortic valve regurgitation is not visualized. Aortic valve mean gradient measures 12.5 mmHg. Aortic valve peak gradient measures 21.4 mmHg. Aortic valve area, by VTI measures 1.73 cm. 26 Edwards  Edwards Sapien bioprosthetic, stented aortic valve (TAVR) valve is present in the aortic position. Procedure Date: 04/13/2019 Echo findings show normal structure and function of the aortic prosthesis. Pulmonic Valve: The pulmonic valve was not well visualized. Pulmonic valve regurgitation is not visualized. Pulmonic regurgitation is not visualized. Aorta: The aortic root is normal in size and structure. Venous: The inferior vena cava is dilated in size with less than 50% respiratory variability, suggesting right atrial pressure of 15 mmHg. IAS/Shunts: No atrial level shunt detected by color flow Doppler.  LEFT VENTRICLE PLAX 2D LVIDd:  4.50 cm  Diastology LVIDs:         3.00 cm  LV e' lateral: 7.83 cm/s LV PW:         1.50 cm  LV e' medial:  9.03 cm/s LV IVS:        1.60 cm LVOT diam:     2.10 cm LV SV:         57 ml LV SV Index:   24.59 LVOT Area:     3.46 cm  LEFT ATRIUM         Index LA diam:    4.70 cm 2.05 cm/m  AORTIC VALVE AV Area (Vmax):    1.72 cm AV Area (Vmean):   1.65 cm AV Area (VTI):     1.73 cm AV Vmax:           231.33 cm/s AV Vmean:          170.000 cm/s AV VTI:            0.459 m AV Peak Grad:      21.4 mmHg AV Mean Grad:      12.5 mmHg LVOT Vmax:         115.00 cm/s LVOT Vmean:        80.800 cm/s LVOT VTI:          0.229 m LVOT/AV VTI ratio: 0.50  AORTA Ao Root diam: 3.10 cm TRICUSPID VALVE TR Peak grad:   31.6 mmHg TR Vmax:        281.00 cm/s  SHUNTS Systemic VTI:  0.23 m Systemic Diam: 2.10 cm  Dani Gobble Croitoru MD Electronically signed by Sanda Klein MD Signature Date/Time: 04/15/2019/11:14:19  AM    Final    ECHOCARDIOGRAM LIMITED  Result Date: 04/13/2019   ECHOCARDIOGRAM LIMITED REPORT   Patient Name:   ADARRIUS GRAEFF Date of Exam: 04/13/2019 Medical Rec #:  [id number]        Height:       74.0 in Accession #:    [id number]       Weight:       214.2 lb Date of Birth:  01-Aug-1935        BSA:          2.24 m Patient Age:    25 years         BP:           141/82 mmHg Patient Gender: M                HR:           72 bpm. Exam Location:  Inpatient  Procedure: Limited Echo, Color Doppler and Cardiac Doppler Indications:    I35.0 Nonrheumatic aortic (valve) stenosis, TAVR Procedure  History:        Patient has prior history of Echocardiogram examinations, most                 recent 01/20/2019.  Sonographer:    Raquel Sarna Senior RDCS Referring Phys: [place]  Sonographer Comments: 10m Edwards Ultra Sapien Kit TAVR FINDINGS PRE-PROCEDURAL FINDINGS: Normal left ventricular systolic function. Estimated LVEF 60-65%. There are no regional wall motion abnormalities. Severe calcific aortic stenosis. Probably trileaflet aortic valve. Peak aortic valve gradient 65 mm Hg, mean gradient 37 mm Hg. Dimensionless obstructive index 0.28, calculated aortic valve area is 0.97 cm sq. Mild mitral insufficiency. Trivial pericardial effusion. POST-PROCEDURAL FINDINGS: Hyperdynamic left ventricular systolic function. Estimated LVEF 75%. There are no regional wall motion abnormalities. Severe  calcific aortic stenosis. Trileaflet aortic valve. Peak aortic valve gradient 11 mm Hg, mean gradient 6 mm Hg. Dimensionless obstructive index 0.68, calculated aortic valve area is 2.34 cm. There is no aortic insufficiency and no perivalvular leak. Unchanged trivial pericardial effusion and mild mitral insufficiency. Aortic Valve: Aortic valve mean gradient measures 6.0 mmHg. Aortic valve peak gradient measures 10.8 mmHg. Aortic valve area, by VTI measures 2.34 cm.  LEFT VENTRICLE          Normals PLAX 2D LVOT diam:     2.10 cm  LVOT Area:     3.46 cm  AORTIC VALVE                    Normals AV Area (Vmax):    2.45 cm AV Area (Vmean):   2.44 cm AV Area (VTI):     2.34 cm AV Vmax:           164.00 cm/s AV Vmean:          114.000 cm/s AV VTI:            0.377 m AV Peak Grad:      10.8 mmHg AV Mean Grad:      6.0 mmHg LVOT Vmax:         116.00 cm/s LVOT Vmean:        80.300 cm/s LVOT VTI:          0.255 m LVOT/AV VTI ratio: 0.68  SHUNTS Systemic VTI:  0.26 m Systemic Diam: 2.10 cm  Sanda Klein MD Electronically signed by Sanda Klein MD Signature Date/Time: 04/13/2019/6:46:55 PM    Final    Structural Heart Procedure  Result Date: 04/13/2019 See surgical note for result.   Assessment & Plan:   There are no diagnoses linked to this encounter.   No orders of the defined types were placed in this encounter.    Follow-up: No follow-ups on file.  Walker Kehr, MD

## 2019-04-20 NOTE — Progress Notes (Signed)
[facility] [place]                                       Cardiology Office Note    Date:  04/21/2019   ID:  [name] White, DOB 1936-03-14, MRN [id number]  PCP:  Cassandria Anger, MD  Cardiologist: Dr. Haroldine Laws / Dr. Burt Knack & Dr. Roxy Manns (TAVR)  CC: [facility] s/p TAVR   History of Present Illness:  [name] White is a 84 y.o. 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  Patient states that he has known presence of a heart murmur for many years. His cardiac history dates back more than 10 years ago when he was first found to have atrial flutter. He underwent atrial flutter ablation by Dr. Lovena Le and initially did well but subsequently developed atrial fibrillation. He was treated for Tikosyn for a period of time but developed recurrent atrial fibrillation which has remained persistent. He has been followed for many years by Dr. Mauricia Area previous echocardiograms have documented the presence of normal left ventricular systolic function and aortic stenosis which has gradually progressed in severity. Patient remains remarkably active until approximately 1 year ago when he was initially diagnosed with diabetes and subsequently found to have CML. He has been treated with chemotherapy. Over the past year he developed gradual worsening of exertional shortness of breath.Echocardiogram performed January 20, 2019 revealed severe aortic stenosis with preserved left ventricular systolic function. Peak velocity across aortic valve was reported close to 4.0 m/s with mean transvalvular gradient estimated 41.5 mmHg. The DVI was notably quite low at 0.20.Over the last several months his  symptoms progressed substantially until early December when he was seen in the emergency department with acute exacerbation of chronic CHF with resting shortness of breath and orthopnea. He was given intravenous Lasix with excellent response and has been followed carefully ever since by Dr. Haroldine Laws. Symptoms of congestive heart failure have improved considerably. Diagnostic cardiac catheterization was performed March 18, 2019 and notable for mild nonobstructive coronary artery disease. Catheterization confirmed the presence of aortic stenosis with peak to peak and mean transvalvular gradients measured 51 and 36 mmHg, respectively. Right heart pressures were mildly elevated. Of note, he was found to have pleural effusions during work up. Thoracentesis was offered at the time of TAVR but he declined.   He was evaluated by the multidisciplinary valve team and underwent successful TAVR with a 26 mm Edwards Sapien 3 Ultra THV via the TF approach on 04/13/19. Post operative echo showed EF 60-65%, normally functioning TAVR with a mean gradient of 12.36mm hg and no PVL. There was a small circumferential pericardial effusion. Post op CXR showed improvement in pleural effusions. He was resumed on home Eliquis with the addition of a baby aspirin at discharge.   Pt called into our office on 1/14 complaining of a diffuse pruritic rash related to betadine. He was Rx'd a steroid taper which helped immediately.   Today he presents to clinic for follow up. He is doing very well.  He can tell a big difference in how he feels since having TAVR. No CP or  SOB. No LE edema, orthopnea or PND. No dizziness or syncope. No blood in stool or urine. No palpitations.  He did have some scrotal edema that has been improving slowly. Rash is essentially gone.    Past Medical History:  Diagnosis Date  . Anemia 06/04/2015  . Anxiety   . Atrial flutter ([place])    s/p ablation 07/17/08  . Cancer ([place])    leukemia - dx 05/2018  . Chronic  diastolic CHF (congestive heart failure) ([place])   . Coronary artery disease   . Depression   . Diabetes mellitus type 2 in obese [facility]) 04/10/2009   Qualifier: Diagnosis of  By: Arnoldo Morale MD, Balinda Quails, diet controlled, lost 60lbs  . Emphysema of lung ([place])   . GERD (gastroesophageal reflux disease)   . Glaucoma    recent dx - has an appt to be evaluated and meds to treat  . H/O hiatal hernia   . Hearing loss    some hearing loss in left ear but has not been dx, no hearing aids  . Hepatitis    h/o of type A  . History of measles as a child   . History of mumps as a child   . Hypertension   . Hypothyroidism   . Micturition syncope   . Osteoarthritis   . Pleural effusion, bilateral   . 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 Severe aortic stenosis   . Sleep apnea    uses CPAP nightly  . Vitamin D deficiency 11/24/2015    Past Surgical History:  Procedure Laterality Date  . CARDIAC CATHETERIZATION  03/18/2019  . CARDIAC ELECTROPHYSIOLOGY MAPPING AND ABLATION  06/2008  . CATARACT EXTRACTION W/ INTRAOCULAR LENS  IMPLANT, BILATERAL  ~ 2007  . COLONOSCOPY    . FOOT TENDON SURGERY Left   . INTRAOPERATIVE TRANSTHORACIC ECHOCARDIOGRAM N/A 04/13/2019   Procedure: TRANSTHORACIC ECHOCARDIOGRAM;  Surgeon: Sherren Mocha, MD;  Location: [place];  Service: Open Heart Surgery;  Laterality: N/A;  . RIGHT/LEFT HEART CATH AND CORONARY ANGIOGRAPHY N/A 03/18/2019   Procedure: RIGHT/LEFT HEART CATH AND CORONARY ANGIOGRAPHY;  Surgeon: Jolaine Artist, MD;  Location: [facility];  Service: Cardiovascular;  Laterality: N/A;  . TONSILLECTOMY  1940's  . TOTAL KNEE ARTHROPLASTY  1990's   right  . TOTAL KNEE ARTHROPLASTY Left 08/02/2013   Procedure: LEFT TOTAL KNEE ARTHROPLASTY;  Surgeon: Gearlean Alf, MD;  Location: [facility];  Service: Orthopedics;  Laterality: Left;  .  TRANSCATHETER AORTIC VALVE REPLACEMENT, TRANSFEMORAL N/A 04/13/2019   Procedure: TRANSCATHETER AORTIC VALVE REPLACEMENT, TRANSFEMORAL;  Surgeon: Sherren Mocha, MD;  Location: [place];  Service: Open Heart Surgery;  Laterality: N/A;  . UPPER GI ENDOSCOPY    . WISDOM TOOTH EXTRACTION      Current Medications: Outpatient Medications Prior to Visit  Medication Sig Dispense Refill  . APPLE CIDER VINEGAR PO Take 1 capsule by mouth daily.    [name] aspirin 81 MG chewable tablet Chew 1 tablet (81 mg total) by mouth daily.    . Biotin 10000 MCG TABS Take 10,000 mcg by mouth daily.     [name] CINNAMON PO Take 2,000 mg by mouth 2 (two) times daily.     . Coenzyme Q10 ([employer] COQ-10) 200 MG capsule Take 200 mg by mouth every evening.     . dasatinib (SPRYCEL) 100 MG tablet Take 100 mg by mouth every evening.     [name]  ELIQUIS 5 MG TABS tablet TAKE 1 TABLET BY MOUTH TWICE A DAY 180 tablet 3  . ergocalciferol (VITAMIN D2) 1.25 MG (50000 UT) capsule Take 50,000 Units by mouth every Monday.    . famotidine (PEPCID) 20 MG tablet Take 20 mg by mouth 2 (two) times daily before a meal.    . fenofibrate (TRICOR) 145 MG tablet TAKE 1 TABLET BY MOUTH EVERY DAY 90 tablet 1  . ferrous sulfate 325 (65 FE) MG tablet Take 325 mg by mouth every evening.     . finasteride (PROSCAR) 5 MG tablet Take 5 mg by mouth every evening.     [name] levothyroxine (SYNTHROID) 200 MCG tablet Take 200 mcg by mouth daily before breakfast.    . metolazone (ZAROXOLYN) 2.5 MG tablet Take only as directed by CHF clinic 10 tablet 0  . PARoxetine (PAXIL) 40 MG tablet Take 1 tablet (40 mg total) by mouth every morning. 90 tablet 1  . potassium chloride SA (KLOR-CON M20) 20 MEQ tablet Take 1 tablet (20 mEq total) by mouth every evening.    . protein supplement shake (PREMIER PROTEIN) LIQD Take 12 oz by mouth daily.     . rosuvastatin (CRESTOR) 20 MG tablet Take 10 mg by mouth daily.    [name] spironolactone (ALDACTONE) 25 MG tablet Take 0.5 tablets (12.5 mg total) by  mouth daily. 45 tablet 3  . torsemide (DEMADEX) 20 MG tablet Take 1 tablet (20 mg total) by mouth daily. May take and additional tab in PM 60 tablet 3  . traZODone (DESYREL) 50 MG tablet Take 50 mg by mouth at bedtime.    . TURMERIC PO Take 1,000 mg by mouth every evening.     . furosemide (LASIX) 40 MG tablet Take 40 mg by mouth daily.    . predniSONE (STERAPRED UNI-PAK 21 TAB) 10 MG (21) TBPK tablet Take 6 tablets (60 mg total) by mouth daily for 1 day, THEN 5 tablets (50 mg total) daily for 1 day, THEN 4 tablets (40 mg total) daily for 1 day, THEN 3 tablets (30 mg total) daily for 1 day, THEN 2 tablets (20 mg total) daily for 1 day, THEN 1 tablet (10 mg total) daily for 1 day. 21 tablet 0   No facility-administered medications prior to visit.     Allergies:   Ace inhibitors, Benazepril, Codeine, Statins, Piroxicam, Pseudoephedrine, Dasatinib, and Betadine [povidone iodine]   Social History   Socioeconomic History  . Marital status: Married    Spouse name: Not on file  . Number of children: Not on file  . Years of education: Not on file  . Highest education level: Not on file  Occupational History  . Not on file  Tobacco Use  . Smoking status: Former Smoker    Years: 0.00    Types: Pipe, Cigars    Quit date: 04/01/1996    Years since quitting: 23.0  . Smokeless tobacco: Former Systems developer    Types: [place] date: 04/01/2009  Substance and Sexual Activity  . Alcohol use: Not Currently    Alcohol/week: 0.0 standard drinks    Comment: Quit wine early 2020.  [name] Drug use: No  . Sexual activity: Not Currently    Comment: lives with wife, eats clean diet. No major dietary restrtictions,   Other Topics Concern  . Not on file  Social History Narrative  . Not on file   Social Determinants of Health   Financial Resource Strain:   . Difficulty  of Paying Living Expenses: Not on file  Food Insecurity:   . Worried About Charity fundraiser in the Last Year: Not on file  . Ran Out of Food  in the Last Year: Not on file  Transportation Needs:   . Lack of Transportation (Medical): Not on file  . Lack of Transportation (Non-Medical): Not on file  Physical Activity:   . Days of Exercise per Week: Not on file  . Minutes of Exercise per Session: Not on file  Stress:   . Feeling of Stress : Not on file  Social Connections:   . Frequency of Communication with Friends and Family: Not on file  . Frequency of Social Gatherings with Friends and Family: Not on file  . Attends Religious Services: Not on file  . Active Member of Clubs or Organizations: Not on file  . Attends Archivist Meetings: Not on file  . Marital Status: Not on file     Family History:  The patient's family history includes Heart disease in his maternal grandfather and mother; Hypertension in his son; Multiple sclerosis in his father.     ROS:   Please see the history of present illness.    ROS All other systems reviewed and are negative.   PHYSICAL EXAM:   VS:  BP 130/78   Pulse 81   Ht 6\' 2"  (1.88 m)   Wt 213 lb 6.4 oz (96.8 kg)   SpO2 99%   BMI 27.40 kg/m    GEN: Well nourished, well developed, in no acute distress HEENT: normal Neck: no JVD or masses Cardiac: RRR; no murmurs, rubs, or gallops,no edema  Respiratory:  clear to auscultation bilaterally, normal work of breathing GI: soft, nontender, nondistended, + BS MS: no deformity or atrophy Skin: warm and dry, no rash.  Groin sites clear without hematoma or ecchymosis  Neuro:  Alert and Oriented x 3, Strength and sensation are intact Psych: euthymic mood, full affect   Wt Readings from Last 3 Encounters:  04/21/19 213 lb 6.4 oz (96.8 kg)  04/20/19 218 lb (98.9 kg)  04/14/19 226 lb 9.6 oz (102.8 kg)      Studies/Labs Reviewed:   EKG:  EKG is ordered today.  The ekg ordered today demonstrates atrial flutter with 4-1 block.  Heart rate 75  Recent Labs: 04/09/2019: ALT 25; B Natriuretic Peptide 194.8 04/14/2019: BUN 23;  Creatinine, Ser 1.02; Hemoglobin 10.5; Magnesium 2.0; Platelets 106; Potassium 3.7; Sodium 137   Lipid Panel    Component Value Date/Time   CHOL 171 01/22/2018 1003   TRIG 98 01/22/2018 1003   TRIG 55 02/24/2006 0946   HDL 57 01/22/2018 1003   CHOLHDL 3.0 01/22/2018 1003   VLDL 20 01/22/2018 1003   LDLCALC 94 01/22/2018 1003   LDLDIRECT 141.6 11/12/2010 1205    Additional studies/ records that were reviewed today include:  TAVR OPERATIVE NOTE   Date of Procedure:04/13/2019  Preoperative Diagnosis:Severe Aortic Stenosis   Postoperative Diagnosis:Same   Procedure:   Transcatheter Aortic Valve Replacement - PercutaneousRightTransfemoral Approach Edwards Sapien 3 Ultra THV (size 73mm, model # 9750TFX, serial # [id number])  Co-Surgeons:Clarence H. Roxy Manns, MD and Sherren Mocha, MD  Anesthesiologist:Stephen Gifford Shave, MD  Echocardiographer:Mihai Croitoru, MD  Pre-operative Echo Findings: ? Severe aortic stenosis ? Normalleft ventricular systolic function  Post-operative Echo Findings: ? Noparavalvular leak ? Normalleft ventricular systolic function  _____________   Echo 04/14/19 IMPRESSIONS  1. Left ventricular ejection fraction, by visual estimation, is 60 to 65%. The left  ventricle has normal function. There is moderately increased left ventricular hypertrophy.  2. Left ventricular diastolic function could not be evaluated.  3. The left ventricle has no regional wall motion abnormalities.  4. Global right ventricle has normal systolic function.The right ventricular size is normal. No increase in right ventricular wall thickness.  5. Left atrial size was severely dilated.  6. Right atrial size was mild-moderately dilated.  7. Small pericardial effusion.  8. The pericardial effusion is circumferential.  9. Mild mitral annular calcification. 10. The  mitral valve is normal in structure. Trivial mitral valve regurgitation. 11. The tricuspid valve is normal in structure. 12. Aortic stent-valve prosthesis is well-seated, without perivalvular leak. 13. Aortic valve mean gradient measures 12.5 mmHg. 14. Aortic valve peak gradient measures 21.4 mmHg. 15. Aortic valve regurgitation is not visualized. 16. The pulmonic valve was not well visualized. Pulmonic valve regurgitation is not visualized. 17. Mildly elevated pulmonary artery systolic pressure. 18. The tricuspid regurgitant velocity is 2.81 m/s, and with an assumed right atrial pressure of 15 mmHg, the estimated right ventricular systolic pressure is mildly elevated at 46.6 mmHg. 19. The inferior vena cava is dilated in size with <50% respiratory variability, suggesting right atrial pressure of 15 mmHg.  Aortic Valve: The aortic valve has been repaired/replaced. Aortic valve regurgitation is not visualized. Aortic valve mean gradient measures 12.5 mmHg. Aortic valve peak gradient measures 21.4 mmHg. Aortic valve area, by VTI measures 1.73 cm. 26 Edwards Edwards Sapien bioprosthetic, stented aortic valve (TAVR) valve is present in the aortic position. Procedure Date: 04/13/2019 Echo findings show normal structure and function of the aortic prosthesis.    ASSESSMENT & PLAN:   Severe AS s/p TAVR: doing excellent 1 week out from TAVR. Groin sites healing well.  ECG with atrial flutter with no HAVB. SBE prophylaxis discussed; I have RX'd amoxicillin. Continue on Eliquis and aspirin. He can come off of aspirin after 6 months ( 10/11/19). He had a previously scheduled appt with Dr. Bing Matter for next month that can count as his 1 month visit. Echo scheduled for before OV.  Chronic diastolic CHF: He appears euvolemic.  Continue torsemide 20 mg daily and Kdur 20 mEq daily. However, there has been some confusion with his potassium dosing and he has been taking 40 mEq daily.  Will check a BMP today to  follow K. He has had some scrotal edema that is improving with time. Per review of Dr. Clayborne Dana last office note, there is concern for potential cardiac amyloidis and he is planning for a cardiac MRI in the near future.  Pleural effusions: pt declined thoracentesis. Post op CXR showed improving effusions. Will get CXR at 1 month follow up to follow.  HTN: BP well controlled. Reviewed home log which is also under good control  Chronic afib: continue Eliquis.   Rash:  improved with steroids.   Medication Adjustments/Labs and Tests Ordered: Current medicines are reviewed at length with the patient today.  Concerns regarding medicines are outlined above.  Medication changes, Labs and Tests ordered today are listed in the Patient Instructions below. Patient Instructions  Medication Instructions:  Your provider discussed the importance of taking an antibiotic prior to all dental visits to prevent damage to the heart valves from infection. You were given a prescription for AMOXIL 2,000 mg to take one hour prior to any dental appointment.   Testing/Procedures: Joellen Jersey recommends you get a CHEST XRAY on May 14, 2019. You do not need an appointment. Go ANY TIME during business  hours.  [facility] at [facility], [place], [place] [address]  Follow-Up: Please keep your follow-up appointments!    Signed, Angelena Form, PA-C  04/21/2019 5:11 PM    [facility] [place], [place], [place]  [address] Phone: [phone]; Fax: [phone]

## 2019-04-20 NOTE — Assessment & Plan Note (Signed)
Trazodone Paroxetin 40 mg

## 2019-04-21 ENCOUNTER — Encounter: Payer: Self-pay | Admitting: Physician Assistant

## 2019-04-21 ENCOUNTER — Ambulatory Visit (INDEPENDENT_AMBULATORY_CARE_PROVIDER_SITE_OTHER): Payer: Medicare Other | Admitting: Physician Assistant

## 2019-04-21 VITALS — BP 130/78 | HR 81 | Ht 74.0 in | Wt 213.4 lb

## 2019-04-21 DIAGNOSIS — I5032 Chronic diastolic (congestive) heart failure: Secondary | ICD-10-CM | POA: Diagnosis not present

## 2019-04-21 DIAGNOSIS — I4819 Other persistent atrial fibrillation: Secondary | ICD-10-CM | POA: Diagnosis not present

## 2019-04-21 DIAGNOSIS — I10 Essential (primary) hypertension: Secondary | ICD-10-CM | POA: Diagnosis not present

## 2019-04-21 DIAGNOSIS — Z952 Presence of prosthetic heart valve: Secondary | ICD-10-CM

## 2019-04-21 DIAGNOSIS — J90 Pleural effusion, not elsewhere classified: Secondary | ICD-10-CM

## 2019-04-21 DIAGNOSIS — R21 Rash and other nonspecific skin eruption: Secondary | ICD-10-CM

## 2019-04-21 MED ORDER — AMOXICILLIN 500 MG PO TABS: ORAL_TABLET | ORAL | 11 refills | Status: DC

## 2019-04-21 NOTE — Patient Instructions (Signed)
Medication Instructions:  Your provider discussed the importance of taking an antibiotic prior to all dental visits to prevent damage to the heart valves from infection. You were given a prescription for AMOXIL 2,000 mg to take one hour prior to any dental appointment.   Testing/Procedures: [name] White recommends you get a CHEST XRAY on May 14, 2019. You do not need an appointment. Go ANY TIME during business hours.  [facility] at [facility], [place], [place] [address]  Follow-Up: Please keep your follow-up appointments!

## 2019-04-22 ENCOUNTER — Encounter: Payer: Self-pay | Admitting: Physician Assistant

## 2019-04-22 ENCOUNTER — Other Ambulatory Visit: Payer: Self-pay | Admitting: Physician Assistant

## 2019-04-22 LAB — BASIC METABOLIC PANEL
BUN/Creatinine Ratio: 38 — ABNORMAL HIGH (ref 10–24)
BUN: 42 mg/dL — ABNORMAL HIGH (ref 8–27)
CO2: 27 mmol/L (ref 20–29)
Calcium: 9.2 mg/dL (ref 8.6–10.2)
Chloride: 96 mmol/L (ref 96–106)
Creatinine, Ser: 1.12 mg/dL (ref 0.76–1.27)
GFR calc Af Amer: 71 mL/min/{1.73_m2} (ref >59)
GFR calc non Af Amer: 61 mL/min/{1.73_m2} (ref >59)
Glucose: 111 mg/dL — ABNORMAL HIGH (ref 65–99)
Potassium: 3.9 mmol/L (ref 3.5–5.2)
Sodium: 138 mmol/L (ref 134–144)

## 2019-04-22 MED ORDER — POTASSIUM CHLORIDE CRYS ER 20 MEQ PO TBCR: 40.0000 meq | EXTENDED_RELEASE_TABLET | Freq: Every evening | ORAL | Status: DC

## 2019-04-22 MED FILL — Heparin Sodium (Porcine) Inj 1000 Unit/ML: INTRAMUSCULAR | Qty: 30 | Status: CN

## 2019-04-22 MED FILL — Magnesium Sulfate Inj 50%: INTRAMUSCULAR | Qty: 10 | Status: CN

## 2019-04-22 MED FILL — Potassium Chloride Inj 2 mEq/ML: INTRAVENOUS | Qty: 40 | Status: CN

## 2019-04-23 MED FILL — Magnesium Sulfate Inj 50%: INTRAMUSCULAR | Qty: 10 | Status: AC

## 2019-04-23 MED FILL — Heparin Sodium (Porcine) Inj 1000 Unit/ML: INTRAMUSCULAR | Qty: 30 | Status: AC

## 2019-04-23 MED FILL — Potassium Chloride Inj 2 mEq/ML: INTRAVENOUS | Qty: 40 | Status: AC

## 2019-05-02 ENCOUNTER — Telehealth: Payer: Self-pay | Admitting: Physician Assistant

## 2019-05-02 NOTE — Telephone Encounter (Signed)
The patient postoperatively answering service complaining of right foot discoloration.  He mention the back of the foot has blotchy, red and purple discoloration after he woke up this morning.  He has no pain.  He felt a temperature of both foot and that they were equal bilaterally.  The right foot did not have any loss of sensation either.  Suspicion for arterial emboli is very low in this case as patient has been compliant with twice daily dosing of Eliquis.  I recommended continued observation and go to the ED if his foot turns ashen white or cyanotic or if he began to lose sensation of the foot or if he started having pain out of proportion in the foot.  At this time he does not have any discomfort in the feet, therefore suspicion for arterial emboli very low.  If symptoms persist tomorrow, I did encourage patient to call our office or PCPs office for early evaluation.

## 2019-05-10 DIAGNOSIS — H401131 Primary open-angle glaucoma, bilateral, mild stage: Secondary | ICD-10-CM | POA: Diagnosis not present

## 2019-05-14 ENCOUNTER — Ambulatory Visit
Admission: RE | Admit: 2019-05-14 | Discharge: 2019-05-14 | Disposition: A | Discharge status: Home / Self Care | Payer: Medicare Other | Source: Ambulatory Visit | Attending: Physician Assistant | Admitting: Physician Assistant

## 2019-05-14 DIAGNOSIS — J90 Pleural effusion, not elsewhere classified: Secondary | ICD-10-CM

## 2019-05-14 DIAGNOSIS — Z8709 Personal history of other diseases of the respiratory system: Secondary | ICD-10-CM | POA: Diagnosis not present

## 2019-05-17 ENCOUNTER — Other Ambulatory Visit: Payer: Self-pay

## 2019-05-17 ENCOUNTER — Encounter (HOSPITAL_COMMUNITY): Payer: Self-pay | Admitting: Internal Medicine

## 2019-05-17 ENCOUNTER — Ambulatory Visit (HOSPITAL_BASED_OUTPATIENT_CLINIC_OR_DEPARTMENT_OTHER)
Admission: RE | Admit: 2019-05-17 | Discharge: 2019-05-17 | Disposition: A | Discharge status: Home / Self Care | Payer: Medicare Other | Source: Ambulatory Visit | Attending: Cardiovascular Disease | Admitting: Cardiovascular Disease

## 2019-05-17 ENCOUNTER — Ambulatory Visit (HOSPITAL_BASED_OUTPATIENT_CLINIC_OR_DEPARTMENT_OTHER)
Admit: 2019-05-17 | Discharge: 2019-05-17 | Disposition: A | Discharge status: Home / Self Care | Payer: Medicare Other | Attending: Physician Assistant | Admitting: Physician Assistant

## 2019-05-17 ENCOUNTER — Ambulatory Visit (HOSPITAL_COMMUNITY)
Admission: RE | Admit: 2019-05-17 | Discharge: 2019-05-17 | Disposition: A | Discharge status: Home / Self Care | Payer: Medicare Other | Source: Ambulatory Visit | Attending: Internal Medicine | Admitting: Internal Medicine

## 2019-05-17 VITALS — BP 112/68 | HR 84 | Wt 220.8 lb

## 2019-05-17 DIAGNOSIS — H409 Unspecified glaucoma: Secondary | ICD-10-CM | POA: Insufficient documentation

## 2019-05-17 DIAGNOSIS — Z952 Presence of prosthetic heart valve: Secondary | ICD-10-CM

## 2019-05-17 DIAGNOSIS — I081 Rheumatic disorders of both mitral and tricuspid valves: Secondary | ICD-10-CM | POA: Insufficient documentation

## 2019-05-17 DIAGNOSIS — I493 Ventricular premature depolarization: Secondary | ICD-10-CM | POA: Diagnosis not present

## 2019-05-17 DIAGNOSIS — E669 Obesity, unspecified: Secondary | ICD-10-CM | POA: Insufficient documentation

## 2019-05-17 DIAGNOSIS — I2510 Atherosclerotic heart disease of native coronary artery without angina pectoris: Secondary | ICD-10-CM | POA: Diagnosis not present

## 2019-05-17 DIAGNOSIS — L819 Disorder of pigmentation, unspecified: Secondary | ICD-10-CM

## 2019-05-17 DIAGNOSIS — I6523 Occlusion and stenosis of bilateral carotid arteries: Secondary | ICD-10-CM | POA: Diagnosis not present

## 2019-05-17 DIAGNOSIS — D721 Eosinophilia, unspecified: Secondary | ICD-10-CM | POA: Diagnosis not present

## 2019-05-17 DIAGNOSIS — D649 Anemia, unspecified: Secondary | ICD-10-CM | POA: Insufficient documentation

## 2019-05-17 DIAGNOSIS — I4819 Other persistent atrial fibrillation: Secondary | ICD-10-CM

## 2019-05-17 DIAGNOSIS — M1990 Unspecified osteoarthritis, unspecified site: Secondary | ICD-10-CM | POA: Insufficient documentation

## 2019-05-17 DIAGNOSIS — J90 Pleural effusion, not elsewhere classified: Secondary | ICD-10-CM | POA: Insufficient documentation

## 2019-05-17 DIAGNOSIS — E039 Hypothyroidism, unspecified: Secondary | ICD-10-CM | POA: Diagnosis not present

## 2019-05-17 DIAGNOSIS — G4733 Obstructive sleep apnea (adult) (pediatric): Secondary | ICD-10-CM | POA: Diagnosis not present

## 2019-05-17 DIAGNOSIS — Z7982 Long term (current) use of aspirin: Secondary | ICD-10-CM | POA: Insufficient documentation

## 2019-05-17 DIAGNOSIS — I4821 Permanent atrial fibrillation: Secondary | ICD-10-CM | POA: Diagnosis not present

## 2019-05-17 DIAGNOSIS — F419 Anxiety disorder, unspecified: Secondary | ICD-10-CM | POA: Insufficient documentation

## 2019-05-17 DIAGNOSIS — R0989 Other specified symptoms and signs involving the circulatory and respiratory systems: Secondary | ICD-10-CM | POA: Diagnosis not present

## 2019-05-17 DIAGNOSIS — R233 Spontaneous ecchymoses: Secondary | ICD-10-CM | POA: Diagnosis not present

## 2019-05-17 DIAGNOSIS — K219 Gastro-esophageal reflux disease without esophagitis: Secondary | ICD-10-CM | POA: Diagnosis not present

## 2019-05-17 DIAGNOSIS — I110 Hypertensive heart disease with heart failure: Secondary | ICD-10-CM | POA: Insufficient documentation

## 2019-05-17 DIAGNOSIS — I5032 Chronic diastolic (congestive) heart failure: Secondary | ICD-10-CM | POA: Diagnosis not present

## 2019-05-17 DIAGNOSIS — F329 Major depressive disorder, single episode, unspecified: Secondary | ICD-10-CM | POA: Diagnosis not present

## 2019-05-17 DIAGNOSIS — E785 Hyperlipidemia, unspecified: Secondary | ICD-10-CM | POA: Insufficient documentation

## 2019-05-17 DIAGNOSIS — I350 Nonrheumatic aortic (valve) stenosis: Secondary | ICD-10-CM

## 2019-05-17 DIAGNOSIS — E119 Type 2 diabetes mellitus without complications: Secondary | ICD-10-CM | POA: Diagnosis not present

## 2019-05-17 DIAGNOSIS — I482 Chronic atrial fibrillation, unspecified: Secondary | ICD-10-CM

## 2019-05-17 DIAGNOSIS — I4892 Unspecified atrial flutter: Secondary | ICD-10-CM | POA: Insufficient documentation

## 2019-05-17 DIAGNOSIS — I739 Peripheral vascular disease, unspecified: Secondary | ICD-10-CM

## 2019-05-17 DIAGNOSIS — J439 Emphysema, unspecified: Secondary | ICD-10-CM | POA: Diagnosis not present

## 2019-05-17 DIAGNOSIS — Z7901 Long term (current) use of anticoagulants: Secondary | ICD-10-CM | POA: Insufficient documentation

## 2019-05-17 DIAGNOSIS — Z79899 Other long term (current) drug therapy: Secondary | ICD-10-CM | POA: Insufficient documentation

## 2019-05-17 LAB — CBC WITH DIFFERENTIAL/PLATELET
Abs Immature Granulocytes: 0.01 10*3/uL (ref 0.00–0.07)
Basophils Absolute: 0 10*3/uL (ref 0.0–0.1)
Basophils Relative: 1 %
Eosinophils Absolute: 0.2 10*3/uL (ref 0.0–0.5)
Eosinophils Relative: 5 %
HCT: 37.1 % — ABNORMAL LOW (ref 39.0–52.0)
Hemoglobin: 11.8 g/dL — ABNORMAL LOW (ref 13.0–17.0)
Immature Granulocytes: 0 %
Lymphocytes Relative: 42 %
Lymphs Abs: 1.8 10*3/uL (ref 0.7–4.0)
MCH: 28.5 pg (ref 26.0–34.0)
MCHC: 31.8 g/dL (ref 30.0–36.0)
MCV: 89.6 fL (ref 80.0–100.0)
Monocytes Absolute: 0.4 10*3/uL (ref 0.1–1.0)
Monocytes Relative: 10 %
Neutro Abs: 1.8 10*3/uL (ref 1.7–7.7)
Neutrophils Relative %: 42 %
Platelets: 166 10*3/uL (ref 150–400)
RBC: 4.14 MIL/uL — ABNORMAL LOW (ref 4.22–5.81)
RDW: 16 % — ABNORMAL HIGH (ref 11.5–15.5)
WBC: 4.3 10*3/uL (ref 4.0–10.5)
nRBC: 0 % (ref 0.0–0.2)

## 2019-05-17 LAB — BASIC METABOLIC PANEL
Anion gap: 14 (ref 5–15)
BUN: 40 mg/dL — ABNORMAL HIGH (ref 8–23)
CO2: 24 mmol/L (ref 22–32)
Calcium: 9.1 mg/dL (ref 8.9–10.3)
Chloride: 99 mmol/L (ref 98–111)
Creatinine, Ser: 1.25 mg/dL — ABNORMAL HIGH (ref 0.61–1.24)
GFR calc Af Amer: >60 mL/min (ref >60)
GFR calc non Af Amer: 54 mL/min — ABNORMAL LOW (ref >60)
Glucose, Bld: 113 mg/dL — ABNORMAL HIGH (ref 70–99)
Potassium: 3.9 mmol/L (ref 3.5–5.1)
Sodium: 137 mmol/L (ref 135–145)

## 2019-05-17 LAB — BRAIN NATRIURETIC PEPTIDE: B Natriuretic Peptide: 160 pg/mL — ABNORMAL HIGH (ref 0.0–100.0)

## 2019-05-17 MED ORDER — TORSEMIDE 20 MG PO TABS: 20.0000 mg | ORAL_TABLET | Freq: Every day | ORAL | 3 refills | Status: DC

## 2019-05-17 MED ORDER — POTASSIUM CHLORIDE CRYS ER 20 MEQ PO TBCR: 40.0000 meq | EXTENDED_RELEASE_TABLET | Freq: Every evening | ORAL | 3 refills | Status: DC

## 2019-05-17 NOTE — Progress Notes (Addendum)
CARDIOLOGY CLINIC NOTE  Patient ID: [name] White, male   DOB: 31-Dec-1935, 84 y.o.   MRN: [id number] PCP: Benay Pillow Also sees the [place] in [place]  HPI: Regional is a delightful 84 year old male with a history of non-obstructive CAD, micturition syncope, hypertension, hyperlipidemia,borderline diabetes, obesity, sleep apnea, atrial flutter s/p a. flutter ablation by Dr. Lovena Le 06/2008. Developed PAF in 2013 which was quite symptomatic and placed on Tikosyn and Eliquis. Eventually developed persistent AF.   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 Being treated at [facility]. Has lost over 40 pounds. BP down so clonidine stopped.    Echo 10/20 EF 55-60% RV ok. Severe AS mean gradient 50mmHG Small effusion. Mild to mod MR/TR  Since we last saw him underwent successful TAVR on 04/13/19. At the time also had pleural effusions which have resolved. Doing well from cardiac standpoint. Breathing is great. Endurance improving. Volume status much is much improved.   Several days after TAVR had cold R middle finger and big toe on R foot went black. Now recovering. Toe has color back but some petechiae. No claudication. Pulses on R foot not palpable.   Echo today EF 60-65%. TAVR ok. RV ok. Mod TR  Personally reviewed   Cardiac studies:  R/L cath 12/20   Prox RCA to Mid RCA lesion is 30% stenosed.  RPAV lesion is 30% stenosed.  Ost Cx to Prox Cx lesion is 50% stenosed.  Prox LAD to Mid LAD lesion is 20% stenosed.   Findings:  Ao = 82/51(65)  Lv = 138/8   RA = 5 RV = 38/6 PA = 37/13 (22) PCW = 14  Fick cardiac output/index = 9.8/4.3 PVR = 0.7 WU FA sat = 96% PA sat = 78%, 77% High SVC 75%  AoV = peak gradient 33mm HG, mean gradient 36 mmHG, AVA 1.4 cm2   Echo 5/19 EF 55-60% moderate AS mean gradient 22  Cardiac CT in 07/2011 Left Main: No plaque or stenosis.  Left Anterior Descending: Prominent mixed plaque in the proximal  LAD with probably moderate (50-70%) stenosis. The first  diagonal  was a small vessel with calcified plaque at the ostium (could be  significant stenosis but cannot say for sure due to blooming  artifact from the calcium).  Left Circumflex: Mixed plaque in the proximal LCx with mild  stenosis.  Right Coronary Artery: Mixed plaque in the mid RCA with mild  stenosis. Dominant vessel.  Coronary Calcium Score: 355 Agatston units    Lab Results  Component Value Date   CHOL 171 01/22/2018   HDL 57 01/22/2018   LDLCALC 94 01/22/2018   LDLDIRECT 141.6 11/12/2010   TRIG 98 01/22/2018   CHOLHDL 3.0 01/22/2018   ROS: All systems negative except as listed in HPI, PMH and Problem List.  Past Medical History:  Diagnosis Date  . Anemia 06/04/2015  . Anxiety   . Atrial flutter ([place])    s/p ablation 07/17/08  . Cancer ([place])    leukemia - dx 05/2018  . Chronic diastolic CHF (congestive heart failure) ([place])   . Coronary artery disease   . Depression   . Diabetes mellitus type 2 in obese [facility]) 04/10/2009   Qualifier: Diagnosis of  By: Arnoldo Morale MD, Balinda Quails, diet controlled, lost 60lbs  . Emphysema of lung ([place])   . GERD (gastroesophageal reflux disease)   . Glaucoma    recent dx - has an appt to be evaluated and meds to treat  . H/O hiatal hernia   .  Hearing loss    some hearing loss in left ear but has not been dx, no hearing aids  . Hepatitis    h/o of type A  . History of measles as a child   . History of mumps as a child   . Hypertension   . Hypothyroidism   . Micturition syncope   . Osteoarthritis   . Pleural effusion, bilateral   . 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 Severe aortic stenosis   . Sleep apnea    uses CPAP nightly  . Vitamin D deficiency 11/24/2015    Current Outpatient Medications  Medication Sig Dispense Refill  . amoxicillin (AMOXIL) 500 MG tablet Take 4 capsules (2,000 mg) one hour prior to  all dental visits. 8 tablet 11  . APPLE CIDER VINEGAR PO Take 1 capsule by mouth daily.    [name] aspirin 81 MG chewable tablet Chew 1 tablet (81 mg total) by mouth daily.    . Biotin 10000 MCG TABS Take 10,000 mcg by mouth daily.     [name] CINNAMON PO Take 2,000 mg by mouth 2 (two) times daily.     . Coenzyme Q10 ([facility] COQ-10) 200 MG capsule Take 200 mg by mouth every evening.     . dasatinib (SPRYCEL) 100 MG tablet Take 100 mg by mouth every evening.     [name] ELIQUIS 5 MG TABS tablet TAKE 1 TABLET BY MOUTH TWICE A DAY 180 tablet 3  . ergocalciferol (VITAMIN D2) 1.25 MG (50000 UT) capsule Take 50,000 Units by mouth every Monday.    . famotidine (PEPCID) 20 MG tablet Take 20 mg by mouth 2 (two) times daily before a meal.    . fenofibrate (TRICOR) 145 MG tablet TAKE 1 TABLET BY MOUTH EVERY DAY 90 tablet 1  . ferrous sulfate 325 (65 FE) MG tablet Take 325 mg by mouth every evening.     . finasteride (PROSCAR) 5 MG tablet Take 5 mg by mouth every evening.     . latanoprost (XALATAN) 0.005 % ophthalmic solution 1 drop at bedtime.    [name] levothyroxine (SYNTHROID) 200 MCG tablet Take 200 mcg by mouth daily before breakfast.    . metolazone (ZAROXOLYN) 2.5 MG tablet Take only as directed by CHF clinic 10 tablet 0  . PARoxetine (PAXIL) 40 MG tablet Take 1 tablet (40 mg total) by mouth every morning. 90 tablet 1  . potassium chloride SA (KLOR-CON M20) 20 MEQ tablet Take 2 tablets (40 mEq total) by mouth every evening.    . protein supplement shake (PREMIER PROTEIN) LIQD Take 12 oz by mouth daily.     . rosuvastatin (CRESTOR) 20 MG tablet Take 20 mg by mouth daily.     [name] spironolactone (ALDACTONE) 25 MG tablet Take 0.5 tablets (12.5 mg total) by mouth daily. 45 tablet 3  . torsemide (DEMADEX) 20 MG tablet Take 1 tablet (20 mg total) by mouth daily. May take and additional tab in PM 60 tablet 3  . traZODone (DESYREL) 50 MG tablet Take 50 mg by mouth at bedtime.    . TURMERIC PO Take 1,000 mg by mouth every evening.        No current facility-administered medications for this encounter.    Vitals:   05/17/19 1357  BP: 112/68  Pulse: 84  SpO2: 98%  Weight: 100.2 kg (220 lb 12.8 oz)    PHYSICAL  EXAM: General:  Well appearing. No resp difficulty HEENT: normal Neck: supple. no JVD. Carotids 2+ bilat; no bruits. No lymphadenopathy or thryomegaly appreciated. Cor: PMI nondisplaced. Irregular rate & rhythm. No rubs, gallops or murmurs. Lungs: decreased BS throughout Abdomen: soft, nontender, nondistended. No hepatosplenomegaly. No bruits or masses. Good bowel sounds. Extremities: no cyanosis, clubbing, rash, edema R food pulses not palpable L foot PT palpable. Femoral pulses 1+. no bruit  R 1st toe + petechiae Neuro: alert & orientedx3, cranial nerves grossly intact. moves all 4 extremities w/o difficulty. Affect pleasant  ECG: AF 86 + PVCs Personally reviewed  ASSESSMENT & PLAN:  1) Chronic diastolic HF in setting of severe AS - -Improved NYHA II post TAVR - Volume status much improved  - Continue torsemide 40 daily. Can wean as tolerated now that he is s/p TAVR - Check labs - ? Need for amyloid w/u. Consider cMRI at next visit  2) Severe AS - s/p TAVR 1/21 - Valve looks good on echo - Aware of need for SBE prophylaxis  3) CAD - No current s/s ischemia. Last cardiac CT 2013 with 50-70% LAD - cath 12/20 with stable CAD - Continue Crestor 10. Not on ASA due to Eliquis. Not on BB with bradycardia  4) CML - Following with Dr. Don Broach at [facility] - On dasatinib  5) Atrial fibrillation, permanent - Tikosyn stopped 5/17 due to persistent AF. - Remains in AF today. Rate ok.  - Continue Eliquis 5 mg BID. No bleeding  4) HTN - Blood pressure well controlled. Continue current regimen.  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 Mild eosinophilia on manual diff - I reviewed LE Dopplers and ABIs personally and no evidence of high-grade obstruction - Dr. Burt Knack aware as well  Glori Bickers, MD 2:30 PM `

## 2019-05-17 NOTE — Progress Notes (Signed)
  Echocardiogram 2D Echocardiogram has been performed.  [name] White 05/17/2019, 1:46 PM

## 2019-05-17 NOTE — Progress Notes (Signed)
Right lower extremity arterial duplex limited study completed.  Preliminary results can be found under CV proc under chart review.  05/17/2019 4:14 PM  Karlis Cregg, K., RDMS, RVT

## 2019-05-17 NOTE — Patient Instructions (Signed)
Labs done today, your results will be available in MyChart, we will contact you for abnormal readings.  Your physician recommends that you schedule a follow-up appointment in: 3 months  If you have any questions or concerns before your next appointment please send [place] a message through [place] or call our office at [phone].  At the [facility], you and your health needs are our priority. As part of our continuing mission to provide you with exceptional heart care, we have created designated Provider Care Teams. These Care Teams include your primary Cardiologist (physician) and Advanced Practice Providers (APPs- Physician Assistants and Nurse Practitioners) who all work together to provide you with the care you need, when you need it.   You may see any of the following providers on your designated Care Team at your next follow up: [name] Dr Glori Bickers . Dr Loralie Champagne . Darrick Grinder, NP . Lyda Jester, PA . Audry Riles, PharmD   Please be sure to bring in all your medications bottles to every appointment.

## 2019-05-17 NOTE — Progress Notes (Signed)
[name] has been completed.   Preliminary results in CV Proc.   [name] White 05/17/2019 3:33 PM

## 2019-05-20 NOTE — Addendum Note (Signed)
Encounter addended by: Jolaine Artist, MD on: 05/20/2019 11:27 PM  Actions taken: Clinical Note Signed

## 2019-05-26 DIAGNOSIS — H43812 Vitreous degeneration, left eye: Secondary | ICD-10-CM | POA: Diagnosis not present

## 2019-05-26 DIAGNOSIS — H33011 Retinal detachment with single break, right eye: Secondary | ICD-10-CM | POA: Diagnosis not present

## 2019-05-26 DIAGNOSIS — Z961 Presence of intraocular lens: Secondary | ICD-10-CM | POA: Diagnosis not present

## 2019-05-26 DIAGNOSIS — H33031 Retinal detachment with giant retinal tear, right eye: Secondary | ICD-10-CM | POA: Diagnosis not present

## 2019-05-27 DIAGNOSIS — Z48810 Encounter for surgical aftercare following surgery on the sense organs: Secondary | ICD-10-CM | POA: Diagnosis not present

## 2019-05-27 DIAGNOSIS — H33021 Retinal detachment with multiple breaks, right eye: Secondary | ICD-10-CM | POA: Diagnosis not present

## 2019-05-27 DIAGNOSIS — H43812 Vitreous degeneration, left eye: Secondary | ICD-10-CM | POA: Diagnosis not present

## 2019-05-28 DIAGNOSIS — H33011 Retinal detachment with single break, right eye: Secondary | ICD-10-CM | POA: Diagnosis not present

## 2019-06-02 ENCOUNTER — Telehealth (HOSPITAL_COMMUNITY): Payer: Self-pay | Admitting: Cardiology

## 2019-06-02 NOTE — Telephone Encounter (Signed)
Patient called to request b/p medication Reports he was taken off medication after significant weight loss. Reports b/p readings have been 140's/80's over the past few weeks and would like to start something before it gets higher.   Previously on clonidine  0.3 mg daily

## 2019-06-03 NOTE — Telephone Encounter (Signed)
Please arrange televist for next few weeks to discuss with him

## 2019-06-04 DIAGNOSIS — H43812 Vitreous degeneration, left eye: Secondary | ICD-10-CM | POA: Diagnosis not present

## 2019-06-04 DIAGNOSIS — H33011 Retinal detachment with single break, right eye: Secondary | ICD-10-CM | POA: Diagnosis not present

## 2019-06-07 NOTE — Telephone Encounter (Signed)
Attempted to contact patient x 2  No answer unable to leave message Fax tones

## 2019-06-11 ENCOUNTER — Ambulatory Visit (HOSPITAL_COMMUNITY)
Admission: RE | Admit: 2019-06-11 | Discharge: 2019-06-11 | Disposition: A | Discharge status: Home / Self Care | Payer: Medicare Other | Source: Ambulatory Visit | Attending: Internal Medicine | Admitting: Internal Medicine

## 2019-06-11 DIAGNOSIS — I350 Nonrheumatic aortic (valve) stenosis: Secondary | ICD-10-CM | POA: Diagnosis not present

## 2019-06-11 DIAGNOSIS — I10 Essential (primary) hypertension: Secondary | ICD-10-CM | POA: Diagnosis not present

## 2019-06-11 DIAGNOSIS — I482 Chronic atrial fibrillation, unspecified: Secondary | ICD-10-CM

## 2019-06-11 DIAGNOSIS — I2510 Atherosclerotic heart disease of native coronary artery without angina pectoris: Secondary | ICD-10-CM

## 2019-06-11 MED ORDER — CLONIDINE HCL 0.1 MG PO TABS: ORAL_TABLET | ORAL | 3 refills | Status: DC

## 2019-06-11 NOTE — Progress Notes (Signed)
[facility] Patient added to 6 month recall.

## 2019-06-11 NOTE — Progress Notes (Signed)
Heart Failure TeleHealth Note  Due to national recommendations of social distancing due to [place] 19, Audio/video telehealth visit is felt to be most appropriate for this patient at this time.  See MyChart message from today for patient consent regarding telehealth for [facility].  Date:  06/11/2019   ID:  [name] White, DOB 1935/07/20, MRN [id number]  Location: Home  Provider location: [place] [facility] Type of Visit: Established patient  PCP:  Plotnikov, Evie Lacks, MD  Cardiologist:  No primary care provider on file. Primary HF: Toluwani Yadav  Chief Complaint: Heart Failure follow-up   History of Present Illness:   HPI: [name] White is a delightful 84 year old male with a history of non-obstructive CAD, micturition syncope, hypertension, hyperlipidemia,borderline diabetes, obesity, sleep apnea, atrial flutter s/p a. flutter ablation by Dr. Lovena Le 06/2008, PAF, CML and severe AS s/p TAVR 04/13/19.   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 Being treated at [facility]. Has lost over 40 pounds. BP down so clonidine stopped.    Echo 10/20 EF 55-60% RV ok. Severe AS mean gradient 32mmHG Small effusion. Mild to mod MR/TR. S/p TAVR on 04/13/19.   Echo 05/17/19 EF 60-65%. TAVR ok. RV ok. Mod TR  Personally reviewed  He presents via Engineer, civil (consulting) for a telehealth visit today for further evaluation of his HTN. BP meds stopped after he lost > 40 pounds. Says recently BP has creeping up. SBP were in the 140 range last week but now doing a bit better. SBP this week running 110-120s. Denies CP, orthopnea or PND. Recently had detached retina and underwent surgery. Unable to exercise for 8 weeks  [name] White denies symptoms worrisome for COVID 19.    Cardiac studies:  R/L cath 12/20   Prox RCA to Mid RCA lesion is 30% stenosed.  RPAV lesion is 30% stenosed.  Ost Cx to Prox Cx lesion is 50% stenosed.  Prox LAD to Mid LAD lesion is 20%  stenosed.  Findings:  Ao = 82/51(65)  Lv = 138/8  RA = 5 RV = 38/6 PA = 37/13 (22) PCW = 14  Fick cardiac output/index = 9.8/4.3 PVR = 0.7 WU FA sat = 96% PA sat = 78%, 77% High SVC 75%  AoV = peak gradient 54mm HG, mean gradient 36 mmHG, AVA 1.4 cm2   Past Medical History:  Diagnosis Date  . Anemia 06/04/2015  . Anxiety   . Atrial flutter ([place])    s/p ablation 07/17/08  . Cancer ([place])    leukemia - dx 05/2018  . Chronic diastolic CHF (congestive heart failure) ([place])   . Coronary artery disease   . Depression   . Diabetes mellitus type 2 in obese [facility]) 04/10/2009   Qualifier: Diagnosis of  By: Arnoldo Morale MD, Balinda Quails, diet controlled, lost 60lbs  . Emphysema of lung ([place])   . GERD (gastroesophageal reflux disease)   . Glaucoma    recent dx - has an appt to be evaluated and meds to treat  . H/O hiatal hernia   . Hearing loss    some hearing loss in left ear but has not been dx, no hearing aids  . Hepatitis    h/o of type A  . History of measles as a child   . History of mumps as a child   . Hypertension   . Hypothyroidism   . Micturition syncope   . Osteoarthritis   . Pleural effusion, bilateral   . S/P TAVR (transcatheter  aortic valve replacement) 04/13/2019   26 mm Edwards Sapien 3 transcatheter heart valve placed via percutaneous right transfemoral approach   . Severe aortic stenosis   . Sleep apnea    uses CPAP nightly  . Vitamin D deficiency 11/24/2015   Past Surgical History:  Procedure Laterality Date  . CARDIAC CATHETERIZATION  03/18/2019  . CARDIAC ELECTROPHYSIOLOGY MAPPING AND ABLATION  06/2008  . CATARACT EXTRACTION W/ INTRAOCULAR LENS  IMPLANT, BILATERAL  ~ 2007  . COLONOSCOPY    . FOOT TENDON SURGERY Left   . INTRAOPERATIVE TRANSTHORACIC ECHOCARDIOGRAM N/A 04/13/2019   Procedure: TRANSTHORACIC ECHOCARDIOGRAM;  Surgeon: Sherren Mocha, MD;  Location: [facility];  Service: Open Heart Surgery;  Laterality: N/A;  . RIGHT/LEFT HEART CATH AND CORONARY  ANGIOGRAPHY N/A 03/18/2019   Procedure: RIGHT/LEFT HEART CATH AND CORONARY ANGIOGRAPHY;  Surgeon: Jolaine Artist, MD;  Location: [facility];  Service: Cardiovascular;  Laterality: N/A;  . TONSILLECTOMY  1940's  . TOTAL KNEE ARTHROPLASTY  1990's   right  . TOTAL KNEE ARTHROPLASTY Left 08/02/2013   Procedure: LEFT TOTAL KNEE ARTHROPLASTY;  Surgeon: Gearlean Alf, MD;  Location: [facility];  Service: Orthopedics;  Laterality: Left;  . TRANSCATHETER AORTIC VALVE REPLACEMENT, TRANSFEMORAL N/A 04/13/2019   Procedure: TRANSCATHETER AORTIC VALVE REPLACEMENT, TRANSFEMORAL;  Surgeon: Sherren Mocha, MD;  Location: [place];  Service: Open Heart Surgery;  Laterality: N/A;  . UPPER GI ENDOSCOPY    . WISDOM TOOTH EXTRACTION       Current Outpatient Medications  Medication Sig Dispense Refill  . amoxicillin (AMOXIL) 500 MG tablet Take 4 capsules (2,000 mg) one hour prior to all dental visits. 8 tablet 11  . APPLE CIDER VINEGAR PO Take 1 capsule by mouth daily.    [name] aspirin 81 MG chewable tablet Chew 1 tablet (81 mg total) by mouth daily.    . Biotin 10000 MCG TABS Take 10,000 mcg by mouth daily.     [name] CINNAMON PO Take 2,000 mg by mouth 2 (two) times daily.     . Coenzyme Q10 ([facility] COQ-10) 200 MG capsule Take 200 mg by mouth every evening.     . dasatinib (SPRYCEL) 100 MG tablet Take 100 mg by mouth every evening.     [name] ELIQUIS 5 MG TABS tablet TAKE 1 TABLET BY MOUTH TWICE A DAY 180 tablet 3  . ergocalciferol (VITAMIN D2) 1.25 MG (50000 UT) capsule Take 50,000 Units by mouth every Monday.    . famotidine (PEPCID) 20 MG tablet Take 20 mg by mouth 2 (two) times daily before a meal.    . fenofibrate (TRICOR) 145 MG tablet TAKE 1 TABLET BY MOUTH EVERY DAY 90 tablet 1  . ferrous sulfate 325 (65 FE) MG tablet Take 325 mg by mouth every evening.     . finasteride (PROSCAR) 5 MG tablet Take 5 mg by mouth every evening.     . latanoprost (XALATAN) 0.005 % ophthalmic solution 1 drop at bedtime.    [name]  levothyroxine (SYNTHROID) 200 MCG tablet Take 200 mcg by mouth daily before breakfast.    . metolazone (ZAROXOLYN) 2.5 MG tablet Take only as directed by CHF clinic 10 tablet 0  . PARoxetine (PAXIL) 40 MG tablet Take 1 tablet (40 mg total) by mouth every morning. 90 tablet 1  . potassium chloride SA (KLOR-CON M20) 20 MEQ tablet Take 2 tablets (40 mEq total) by mouth every evening. 180 tablet 3  . protein supplement shake (PREMIER PROTEIN) LIQD Take 12 oz by  mouth daily.     . rosuvastatin (CRESTOR) 20 MG tablet Take 20 mg by mouth daily.     [name] spironolactone (ALDACTONE) 25 MG tablet Take 0.5 tablets (12.5 mg total) by mouth daily. 45 tablet 3  . torsemide (DEMADEX) 20 MG tablet Take 1 tablet (20 mg total) by mouth daily. May take and additional tab in PM 135 tablet 3  . traZODone (DESYREL) 50 MG tablet Take 50 mg by mouth at bedtime.    . TURMERIC PO Take 1,000 mg by mouth every evening.      No current facility-administered medications for this encounter.    Allergies:   Ace inhibitors, Benazepril, Codeine, Statins, Piroxicam, Pseudoephedrine, Dasatinib, and Betadine [povidone iodine]   Social History:  The patient  reports that he quit smoking about 23 years ago. His smoking use included pipe and cigars. He quit after 0.00 years of use. He quit smokeless tobacco use about 10 years ago.  His smokeless tobacco use included chew. He reports previous alcohol use. He reports that he does not use drugs.   Family History:  The patient's family history includes Heart disease in his maternal grandfather and mother; Hypertension in his son; Multiple sclerosis in his father.   ROS:  Please see the history of present illness.   All other systems are personally reviewed and negative.   BP today 118/80 HR 78  Exam:  (Video/Tele Health Call; Exam is subjective and or/visual.) General:  Speaks in full sentences. No resp difficulty. Lungs: Normal respiratory effort with conversation.  Abdomen:  Non-distended per patient report Extremities: Pt denies edema. Neuro: Alert & oriented x 3.   Recent Labs: 04/09/2019: ALT 25 04/14/2019: Magnesium 2.0 05/17/2019: B Natriuretic Peptide 160.0; BUN 40; Creatinine, Ser 1.25; Hemoglobin 11.8; Platelets 166; Potassium 3.9; Sodium 137  Personally reviewed   Wt Readings from Last 3 Encounters:  05/17/19 100.2 kg (220 lb 12.8 oz)  04/21/19 96.8 kg (213 lb 6.4 oz)  04/20/19 98.9 kg (218 lb)      ASSESSMENT AND PLAN:  1) HTN - BP is now well controlled. Will use clonidine 0.1 prn for SBP > = 160  2) Chronic diastolic HF in setting of severe AS - Improved. Now NYHA II post TAVR - Volume status much improved  - Continue torsemide - ? Need for amyloid w/u. Consider cMRI at next visit  3) Severe AS - s/p TAVR 1/21 - Valve looks good on recent echo - Aware of need for SBE prophylaxis  4) CAD - No current s/s ischemia.  - cath 12/20 with stable non-obstructive CAD - Continue Crestor 10. Not on ASA due to Eliquis. Not on BB with bradycardia  5) CML - Following with Dr. Don Broach at [facility] - On dasatinib  6) Atrial fibrillation, permanent - Tikosyn stopped 5/17 due to persistent AF. - Remains in AF today. Rate ok.  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 Mild eosinophilia on manual diff - I reviewed LE Dopplers and ABIs personally and no evidence of high-grade obstruction - Dr. Burt Knack aware as well  Glori Bickers, MD 2:30 PM  COVID screen The patient does not have any symptoms that suggest any further testing/ screening at this  time.  Social distancing reinforced today.  Recommended follow-up:  6 months  Relevant cardiac medications were reviewed at length  with the patient today.   The patient does not have concerns regarding their medications at this time.   The following changes were made today:  As above  Today, I have spent 14 minutes with the patient with telehealth technology discussing the above issues .    Signed, Glori Bickers, MD  06/11/2019 1:19 PM  [facility] [place] [address] [facility] and [place] [address] [phone] (office) [phone] (fax)

## 2019-06-11 NOTE — Patient Instructions (Addendum)
1) clonidine 0.1 mg take prn only for SBP >= 160 ([facility] [address])  2) RTC in 6 months

## 2019-06-11 NOTE — Addendum Note (Signed)
Encounter addended by: Harvie Junior, CMA on: 06/11/2019 1:44 PM  Actions taken: Pharmacy for encounter modified, Order list changed, Clinical Note Signed

## 2019-06-11 NOTE — Telephone Encounter (Signed)
Add on 3/12 @ 1500

## 2019-06-17 ENCOUNTER — Encounter: Payer: Self-pay | Admitting: Internal Medicine

## 2019-06-17 ENCOUNTER — Other Ambulatory Visit: Payer: Self-pay

## 2019-06-17 ENCOUNTER — Ambulatory Visit (INDEPENDENT_AMBULATORY_CARE_PROVIDER_SITE_OTHER): Payer: Medicare Other | Admitting: Internal Medicine

## 2019-06-17 DIAGNOSIS — N5089 Other specified disorders of the male genital organs: Secondary | ICD-10-CM

## 2019-06-17 DIAGNOSIS — I10 Essential (primary) hypertension: Secondary | ICD-10-CM | POA: Diagnosis not present

## 2019-06-17 DIAGNOSIS — E1169 Type 2 diabetes mellitus with other specified complication: Secondary | ICD-10-CM

## 2019-06-17 DIAGNOSIS — E669 Obesity, unspecified: Secondary | ICD-10-CM

## 2019-06-17 DIAGNOSIS — I2510 Atherosclerotic heart disease of native coronary artery without angina pectoris: Secondary | ICD-10-CM | POA: Diagnosis not present

## 2019-06-17 DIAGNOSIS — E559 Vitamin D deficiency, unspecified: Secondary | ICD-10-CM | POA: Diagnosis not present

## 2019-06-17 DIAGNOSIS — H3321 Serous retinal detachment, right eye: Secondary | ICD-10-CM

## 2019-06-17 DIAGNOSIS — I5033 Acute on chronic diastolic (congestive) heart failure: Secondary | ICD-10-CM

## 2019-06-17 DIAGNOSIS — H3320 Serous retinal detachment, unspecified eye: Secondary | ICD-10-CM | POA: Insufficient documentation

## 2019-06-17 NOTE — Assessment & Plan Note (Signed)
Scrotal edema - better

## 2019-06-17 NOTE — Assessment & Plan Note (Signed)
Pt lost wt 

## 2019-06-17 NOTE — Assessment & Plan Note (Signed)
No CP 

## 2019-06-17 NOTE — Progress Notes (Signed)
Subjective:  Patient ID: [name] White, male    DOB: March 08, 1936  Age: 84 y.o. MRN: [id number]  CC: No chief complaint on file.   HPI Kionte Surman presents for scrotal edema - better, CHF, retinal detachment - s/p recent "gas bubble surgery" on the R  Outpatient Medications Prior to Visit  Medication Sig Dispense Refill  . amoxicillin (AMOXIL) 500 MG tablet Take 4 capsules (2,000 mg) one hour prior to all dental visits. 8 tablet 11  . APPLE CIDER VINEGAR PO Take 1 capsule by mouth daily.    [name] aspirin 81 MG chewable tablet Chew 1 tablet (81 mg total) by mouth daily.    . Biotin 10000 MCG TABS Take 10,000 mcg by mouth daily.     [name] CINNAMON PO Take 2,000 mg by mouth 2 (two) times daily.     . cloNIDine (CATAPRES) 0.1 MG tablet Take 1 tablet as needed for systolic blood pressure greater than 160 30 tablet 3  . Coenzyme Q10 ([employer] COQ-10) 200 MG capsule Take 200 mg by mouth every evening.     . dasatinib (SPRYCEL) 100 MG tablet Take 100 mg by mouth every evening.     [name] ELIQUIS 5 MG TABS tablet TAKE 1 TABLET BY MOUTH TWICE A DAY 180 tablet 3  . ergocalciferol (VITAMIN D2) 1.25 MG (50000 UT) capsule Take 50,000 Units by mouth every Monday.    . famotidine (PEPCID) 20 MG tablet Take 20 mg by mouth 2 (two) times daily before a meal.    . fenofibrate (TRICOR) 145 MG tablet TAKE 1 TABLET BY MOUTH EVERY DAY 90 tablet 1  . ferrous sulfate 325 (65 FE) MG tablet Take 325 mg by mouth every evening.     . finasteride (PROSCAR) 5 MG tablet Take 5 mg by mouth every evening.     . latanoprost (XALATAN) 0.005 % ophthalmic solution 1 drop at bedtime.    [name] levothyroxine (SYNTHROID) 200 MCG tablet Take 200 mcg by mouth daily before breakfast.    . metolazone (ZAROXOLYN) 2.5 MG tablet Take only as directed by CHF clinic 10 tablet 0  . PARoxetine (PAXIL) 40 MG tablet Take 1 tablet (40 mg total) by mouth every morning. 90 tablet 1  . potassium chloride SA (KLOR-CON M20) 20 MEQ tablet Take 2 tablets (40 mEq  total) by mouth every evening. 180 tablet 3  . protein supplement shake (PREMIER PROTEIN) LIQD Take 12 oz by mouth daily.     . rosuvastatin (CRESTOR) 20 MG tablet Take 20 mg by mouth daily.     [name] spironolactone (ALDACTONE) 25 MG tablet Take 0.5 tablets (12.5 mg total) by mouth daily. 45 tablet 3  . torsemide (DEMADEX) 20 MG tablet Take 1 tablet (20 mg total) by mouth daily. May take and additional tab in PM 135 tablet 3  . traZODone (DESYREL) 50 MG tablet Take 50 mg by mouth at bedtime.    . TURMERIC PO Take 1,000 mg by mouth every evening.      No facility-administered medications prior to visit.    ROS: Review of Systems  Constitutional: Negative for appetite change, fatigue and unexpected weight change.  HENT: Negative for congestion, nosebleeds, sneezing, sore throat and trouble swallowing.   Eyes: Positive for visual disturbance. Negative for itching.  Respiratory: Negative for cough and shortness of breath.   Cardiovascular: Negative for chest pain, palpitations and leg swelling.  Gastrointestinal: Negative for abdominal distention, blood in stool, diarrhea and nausea.  Genitourinary:  Negative for frequency and hematuria.  Musculoskeletal: Positive for arthralgias and gait problem. Negative for back pain, joint swelling and neck pain.  Skin: Negative for rash.  Neurological: Negative for dizziness, tremors, speech difficulty and weakness.  Psychiatric/Behavioral: Negative for agitation, dysphoric mood and sleep disturbance. The patient is not nervous/anxious.     Objective:  BP 136/86 (BP Location: Left Arm, Patient Position: Sitting, Cuff Size: Normal)   Pulse 95   Temp 98.4 F (36.9 C) (Oral)   Ht 6\' 2"  (1.88 m)   Wt 228 lb (103.4 kg)   SpO2 95%   BMI 29.27 kg/m   BP Readings from Last 3 Encounters:  06/17/19 136/86  05/17/19 112/68  04/21/19 130/78    Wt Readings from Last 3 Encounters:  06/17/19 228 lb (103.4 kg)  05/17/19 220 lb 12.8 oz (100.2 kg)  04/21/19  213 lb 6.4 oz (96.8 kg)    Physical Exam Constitutional:      General: He is not in acute distress.    Appearance: He is well-developed.     Comments: NAD  Eyes:     Conjunctiva/sclera: Conjunctivae normal.     Pupils: Pupils are equal, round, and reactive to light.  Neck:     Thyroid: No thyromegaly.     Vascular: No JVD.  Cardiovascular:     Rate and Rhythm: Normal rate and regular rhythm.     Heart sounds: Normal heart sounds. No murmur. No friction rub. No gallop.   Pulmonary:     Effort: Pulmonary effort is normal. No respiratory distress.     Breath sounds: Normal breath sounds. No wheezing or rales.  Chest:     Chest wall: No tenderness.  Abdominal:     General: Bowel sounds are normal. There is no distension.     Palpations: Abdomen is soft. There is no mass.     Tenderness: There is no abdominal tenderness. There is no guarding or rebound.  Musculoskeletal:        General: Tenderness present. Normal range of motion.     Cervical back: Normal range of motion.  Lymphadenopathy:     Cervical: No cervical adenopathy.  Skin:    General: Skin is warm and dry.     Findings: No rash.  Neurological:     Mental Status: He is alert and oriented to person, place, and time.     Cranial Nerves: No cranial nerve deficit.     Motor: No abnormal muscle tone.     Coordination: Coordination abnormal.     Gait: Gait normal.     Deep Tendon Reflexes: Reflexes are normal and symmetric.  Psychiatric:        Behavior: Behavior normal.        Thought Content: Thought content normal.        Judgment: Judgment normal.    Scrotal edema - better Cane Ataxic  Lab Results  Component Value Date   WBC 4.3 05/17/2019   HGB 11.8 (L) 05/17/2019   HCT 37.1 (L) 05/17/2019   PLT 166 05/17/2019   GLUCOSE 113 (H) 05/17/2019   CHOL 171 01/22/2018   TRIG 98 01/22/2018   HDL 57 01/22/2018   LDLDIRECT 141.6 11/12/2010   LDLCALC 94 01/22/2018   ALT 25 04/09/2019   AST 44 (H) 04/09/2019    NA 137 05/17/2019   K 3.9 05/17/2019   CL 99 05/17/2019   CREATININE 1.25 (H) 05/17/2019   BUN 40 (H) 05/17/2019   CO2 24 05/17/2019  TSH 0.86 01/10/2017   PSA 1.42 09/13/2013   INR 1.1 04/09/2019   HGBA1C 6.2 (H) 04/09/2019   MICROALBUR 6.0 (H) 01/10/2017    No results found.  Assessment & Plan:     Walker Kehr, MD

## 2019-06-17 NOTE — Assessment & Plan Note (Signed)
Torsemide, Spironolactone, KCl

## 2019-06-17 NOTE — Patient Instructions (Addendum)
Eye patch Large monior to mirror your laptop screen   Sign up for [employer] ( via [employer] on your phone or your ipad). If you don't have a Art therapist card  - go to [employer] branch. They will set you up in 15 minutes. It is free. You can check out books to read and to listen, check out magazines and newspapers, movies etc.

## 2019-06-17 NOTE — Assessment & Plan Note (Signed)
Vit D 

## 2019-06-17 NOTE — Assessment & Plan Note (Signed)
s/p recent "gas bubble surgery" on the R  Eye patch Large monior to mirror your laptop screen

## 2019-06-25 DIAGNOSIS — H33011 Retinal detachment with single break, right eye: Secondary | ICD-10-CM | POA: Diagnosis not present

## 2019-07-12 ENCOUNTER — Telehealth: Payer: Self-pay | Admitting: Internal Medicine

## 2019-07-12 NOTE — Telephone Encounter (Signed)
    Patient requesting order for labs, due to "swelling" Patient declined to discuss details with scheduler Declined appointment

## 2019-07-13 NOTE — Telephone Encounter (Signed)
Pt has made appt for 07/14/19. Edema in scrotum area.Marland KitchenJohny White

## 2019-07-14 ENCOUNTER — Ambulatory Visit (INDEPENDENT_AMBULATORY_CARE_PROVIDER_SITE_OTHER): Payer: Medicare Other | Admitting: Internal Medicine

## 2019-07-14 ENCOUNTER — Other Ambulatory Visit: Payer: Self-pay

## 2019-07-14 ENCOUNTER — Encounter: Payer: Self-pay | Admitting: Internal Medicine

## 2019-07-14 VITALS — BP 132/76 | HR 84 | Temp 97.9°F | Ht 74.0 in | Wt 235.0 lb

## 2019-07-14 DIAGNOSIS — E559 Vitamin D deficiency, unspecified: Secondary | ICD-10-CM

## 2019-07-14 DIAGNOSIS — T887XXA Unspecified adverse effect of drug or medicament, initial encounter: Secondary | ICD-10-CM

## 2019-07-14 DIAGNOSIS — E669 Obesity, unspecified: Secondary | ICD-10-CM | POA: Diagnosis not present

## 2019-07-14 DIAGNOSIS — N5089 Other specified disorders of the male genital organs: Secondary | ICD-10-CM

## 2019-07-14 DIAGNOSIS — C9210 Chronic myeloid leukemia, BCR/ABL-positive, not having achieved remission: Secondary | ICD-10-CM | POA: Diagnosis not present

## 2019-07-14 DIAGNOSIS — E1169 Type 2 diabetes mellitus with other specified complication: Secondary | ICD-10-CM

## 2019-07-14 LAB — BASIC METABOLIC PANEL
BUN: 37 mg/dL — ABNORMAL HIGH (ref 6–23)
CO2: 28 mEq/L (ref 19–32)
Calcium: 9.4 mg/dL (ref 8.4–10.5)
Chloride: 102 mEq/L (ref 96–112)
Creatinine, Ser: 1.11 mg/dL (ref 0.40–1.50)
GFR: 63.41 mL/min (ref >60.00)
Glucose, Bld: 107 mg/dL — ABNORMAL HIGH (ref 70–99)
Potassium: 4 mEq/L (ref 3.5–5.1)
Sodium: 137 mEq/L (ref 135–145)

## 2019-07-14 LAB — HEPATIC FUNCTION PANEL
ALT: 22 U/L (ref 0–53)
AST: 40 U/L — ABNORMAL HIGH (ref 0–37)
Albumin: 4.3 g/dL (ref 3.5–5.2)
Alkaline Phosphatase: 50 U/L (ref 39–117)
Bilirubin, Direct: 0.1 mg/dL (ref 0.0–0.3)
Total Bilirubin: 0.4 mg/dL (ref 0.2–1.2)
Total Protein: 7.6 g/dL (ref 6.0–8.3)

## 2019-07-14 LAB — HEMOGLOBIN A1C: Hgb A1c MFr Bld: 6.3 % (ref 4.6–6.5)

## 2019-07-14 LAB — TSH: TSH: 1.25 u[IU]/mL (ref 0.35–4.50)

## 2019-07-14 NOTE — Assessment & Plan Note (Signed)
likely due to Dasatinib side effects: scrotum, pubic fat, foreskin - swollen  Doubt it is due to CHF  Scrotum US F/u w/Oncology

## 2019-07-14 NOTE — Assessment & Plan Note (Signed)
On Dasatinib po

## 2019-07-14 NOTE — Progress Notes (Signed)
Subjective:  Patient ID: [name] White, male    DOB: Dec 06, 1935  Age: 84 y.o. MRN: [id number]  CC: No chief complaint on file.   HPI [name] White presents for scrotal swelling - worse F/u CHF, DM  Outpatient Medications Prior to Visit  Medication Sig Dispense Refill  . amoxicillin (AMOXIL) 500 MG tablet Take 4 capsules (2,000 mg) one hour prior to all dental visits. 8 tablet 11  . APPLE CIDER VINEGAR PO Take 1 capsule by mouth daily.    [name] aspirin 81 MG chewable tablet Chew 1 tablet (81 mg total) by mouth daily.    . Biotin 10000 MCG TABS Take 10,000 mcg by mouth daily.     [name] CINNAMON PO Take 2,000 mg by mouth 2 (two) times daily.     . cloNIDine (CATAPRES) 0.1 MG tablet Take 1 tablet as needed for systolic blood pressure greater than 160 30 tablet 3  . Coenzyme Q10 ([employer] COQ-10) 200 MG capsule Take 200 mg by mouth every evening.     . dasatinib (SPRYCEL) 100 MG tablet Take 100 mg by mouth every evening.     [name] ELIQUIS 5 MG TABS tablet TAKE 1 TABLET BY MOUTH TWICE A DAY 180 tablet 3  . ergocalciferol (VITAMIN D2) 1.25 MG (50000 UT) capsule Take 50,000 Units by mouth every Monday.    . famotidine (PEPCID) 20 MG tablet Take 20 mg by mouth 2 (two) times daily before a meal.    . fenofibrate (TRICOR) 145 MG tablet TAKE 1 TABLET BY MOUTH EVERY DAY 90 tablet 1  . ferrous sulfate 325 (65 FE) MG tablet Take 325 mg by mouth every evening.     . finasteride (PROSCAR) 5 MG tablet Take 5 mg by mouth every evening.     . latanoprost (XALATAN) 0.005 % ophthalmic solution 1 drop at bedtime.    [name] levothyroxine (SYNTHROID) 200 MCG tablet Take 200 mcg by mouth daily before breakfast.    . metolazone (ZAROXOLYN) 2.5 MG tablet Take only as directed by CHF clinic 10 tablet 0  . PARoxetine (PAXIL) 40 MG tablet Take 1 tablet (40 mg total) by mouth every morning. 90 tablet 1  . potassium chloride SA (KLOR-CON M20) 20 MEQ tablet Take 2 tablets (40 mEq total) by mouth every evening. 180 tablet 3  .  protein supplement shake (PREMIER PROTEIN) LIQD Take 12 oz by mouth daily.     . rosuvastatin (CRESTOR) 20 MG tablet Take 20 mg by mouth daily.     [name] spironolactone (ALDACTONE) 25 MG tablet Take 0.5 tablets (12.5 mg total) by mouth daily. 45 tablet 3  . torsemide (DEMADEX) 20 MG tablet Take 1 tablet (20 mg total) by mouth daily. May take and additional tab in PM 135 tablet 3  . traZODone (DESYREL) 50 MG tablet Take 50 mg by mouth at bedtime.    . TURMERIC PO Take 1,000 mg by mouth every evening.      No facility-administered medications prior to visit.    ROS: Review of Systems  Constitutional: Negative for appetite change, fatigue and unexpected weight change.  HENT: Negative for congestion, nosebleeds, sneezing, sore throat and trouble swallowing.   Eyes: Negative for itching and visual disturbance.  Respiratory: Negative for cough.   Cardiovascular: Negative for chest pain, palpitations and leg swelling.  Gastrointestinal: Negative for abdominal distention, blood in stool, diarrhea and nausea.  Genitourinary: Positive for penile swelling and scrotal swelling. Negative for decreased urine volume, frequency, genital  sores and hematuria.  Musculoskeletal: Positive for arthralgias. Negative for back pain, gait problem, joint swelling and neck pain.  Skin: Negative for rash.  Neurological: Negative for dizziness, tremors, speech difficulty and weakness.  Psychiatric/Behavioral: Negative for agitation, dysphoric mood, sleep disturbance and suicidal ideas. The patient is not nervous/anxious.     Objective:  BP 132/76 (BP Location: Left Arm, Patient Position: Sitting, Cuff Size: Large)   Pulse 84   Temp 97.9 F (36.6 C) (Oral)   Ht 6\' 2"  (1.88 m)   Wt 235 lb (106.6 kg)   SpO2 94%   BMI 30.17 kg/m   BP Readings from Last 3 Encounters:  07/14/19 132/76  06/17/19 136/86  05/17/19 112/68    Wt Readings from Last 3 Encounters:  07/14/19 235 lb (106.6 kg)  06/17/19 228 lb (103.4 kg)   05/17/19 220 lb 12.8 oz (100.2 kg)    Physical Exam Constitutional:      General: He is not in acute distress.    Appearance: He is well-developed.     Comments: NAD  Eyes:     Conjunctiva/sclera: Conjunctivae normal.     Pupils: Pupils are equal, round, and reactive to light.  Neck:     Thyroid: No thyromegaly.     Vascular: No JVD.  Cardiovascular:     Rate and Rhythm: Normal rate and regular rhythm.     Heart sounds: Normal heart sounds. No murmur. No friction rub. No gallop.   Pulmonary:     Effort: Pulmonary effort is normal. No respiratory distress.     Breath sounds: Normal breath sounds. No wheezing or rales.  Chest:     Chest wall: No tenderness.  Abdominal:     General: Bowel sounds are normal. There is no distension.     Palpations: Abdomen is soft. There is no mass.     Tenderness: There is no abdominal tenderness. There is no guarding or rebound.  Musculoskeletal:        General: No tenderness. Normal range of motion.     Cervical back: Normal range of motion.     Right lower leg: No edema.     Left lower leg: No edema.  Lymphadenopathy:     Cervical: No cervical adenopathy.  Skin:    General: Skin is warm and dry.     Findings: No rash.  Neurological:     Mental Status: He is alert and oriented to person, place, and time.     Cranial Nerves: No cranial nerve deficit.     Motor: No abnormal muscle tone.     Coordination: Coordination normal.     Gait: Gait normal.     Deep Tendon Reflexes: Reflexes are normal and symmetric.  Psychiatric:        Behavior: Behavior normal.        Thought Content: Thought content normal.        Judgment: Judgment normal.   swollen genitals 3+; no LE edema, ascitis  Lab Results  Component Value Date   WBC 4.3 05/17/2019   HGB 11.8 (L) 05/17/2019   HCT 37.1 (L) 05/17/2019   PLT 166 05/17/2019   GLUCOSE 113 (H) 05/17/2019   CHOL 171 01/22/2018   TRIG 98 01/22/2018   HDL 57 01/22/2018   LDLDIRECT 141.6 11/12/2010    LDLCALC 94 01/22/2018   ALT 25 04/09/2019   AST 44 (H) 04/09/2019   NA 137 05/17/2019   K 3.9 05/17/2019   CL 99 05/17/2019   CREATININE 1.25 (  H) 05/17/2019   BUN 40 (H) 05/17/2019   CO2 24 05/17/2019   TSH 0.86 01/10/2017   PSA 1.42 09/13/2013   INR 1.1 04/09/2019   HGBA1C 6.2 (H) 04/09/2019   MICROALBUR 6.0 (H) 01/10/2017    No results found.  Assessment & Plan:   Walker Kehr, MD

## 2019-07-14 NOTE — Patient Instructions (Addendum)
   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 Dasatinib can cause fluid retention in some patients, leading to peripheral edema and pleural effusion. 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 In both patients, transient dasatinib interruption and use of diuretics helped alleviate the symptoms. Both patients had the dose schedule of dasatinib switched to once daily from twice daily. Scrotal and penile edemas are unusual manifestations of fluid retention that, to our knowledge, have not been reported in patients treated with dasatinib. Recognition of these symptoms as a potential complication of dasatinib will help prevent unnecessary investigations, facilitate adequate management, and help alleviate patient anxiety.  Previous article in issueNext article in issue Key words DiureticsFluid retentionPleural effusionTyrosine kinase inhibitor      The swelling is likely due to Dasatinib side effect. Doubt it is due to CHF

## 2019-07-14 NOTE — Assessment & Plan Note (Signed)
Vit D 

## 2019-07-14 NOTE — Assessment & Plan Note (Signed)
likely due to Dasatinib side effects: scrotum, pubic fat, foreskin - swollen Torsemide 40 mg in am Take another 20 mg with lunch if the swelling is worse Use athletic boxers or briefs  Doubt it is due to CHF  Scrotum US F/u w/Oncology

## 2019-07-23 DIAGNOSIS — H33011 Retinal detachment with single break, right eye: Secondary | ICD-10-CM | POA: Diagnosis not present

## 2019-08-05 ENCOUNTER — Ambulatory Visit
Admission: RE | Admit: 2019-08-05 | Discharge: 2019-08-05 | Disposition: A | Discharge status: Home / Self Care | Payer: Medicare Other | Source: Ambulatory Visit | Attending: Internal Medicine | Admitting: Internal Medicine

## 2019-08-05 DIAGNOSIS — N5089 Other specified disorders of the male genital organs: Secondary | ICD-10-CM

## 2019-08-05 DIAGNOSIS — C9210 Chronic myeloid leukemia, BCR/ABL-positive, not having achieved remission: Secondary | ICD-10-CM

## 2019-08-16 ENCOUNTER — Ambulatory Visit (HOSPITAL_COMMUNITY)
Admission: RE | Admit: 2019-08-16 | Discharge: 2019-08-16 | Disposition: A | Discharge status: Home / Self Care | Payer: Medicare Other | Source: Ambulatory Visit | Attending: Internal Medicine | Admitting: Internal Medicine

## 2019-08-16 ENCOUNTER — Encounter (HOSPITAL_COMMUNITY): Payer: Self-pay | Admitting: Internal Medicine

## 2019-08-16 ENCOUNTER — Other Ambulatory Visit: Payer: Self-pay

## 2019-08-16 VITALS — BP 140/90 | HR 80 | Wt 225.8 lb

## 2019-08-16 DIAGNOSIS — C9210 Chronic myeloid leukemia, BCR/ABL-positive, not having achieved remission: Secondary | ICD-10-CM | POA: Insufficient documentation

## 2019-08-16 DIAGNOSIS — J439 Emphysema, unspecified: Secondary | ICD-10-CM | POA: Insufficient documentation

## 2019-08-16 DIAGNOSIS — F329 Major depressive disorder, single episode, unspecified: Secondary | ICD-10-CM | POA: Insufficient documentation

## 2019-08-16 DIAGNOSIS — E039 Hypothyroidism, unspecified: Secondary | ICD-10-CM | POA: Diagnosis not present

## 2019-08-16 DIAGNOSIS — H409 Unspecified glaucoma: Secondary | ICD-10-CM | POA: Insufficient documentation

## 2019-08-16 DIAGNOSIS — Z8249 Family history of ischemic heart disease and other diseases of the circulatory system: Secondary | ICD-10-CM | POA: Insufficient documentation

## 2019-08-16 DIAGNOSIS — K219 Gastro-esophageal reflux disease without esophagitis: Secondary | ICD-10-CM | POA: Diagnosis not present

## 2019-08-16 DIAGNOSIS — I10 Essential (primary) hypertension: Secondary | ICD-10-CM

## 2019-08-16 DIAGNOSIS — I110 Hypertensive heart disease with heart failure: Secondary | ICD-10-CM | POA: Insufficient documentation

## 2019-08-16 DIAGNOSIS — N5089 Other specified disorders of the male genital organs: Secondary | ICD-10-CM | POA: Insufficient documentation

## 2019-08-16 DIAGNOSIS — Z79890 Hormone replacement therapy (postmenopausal): Secondary | ICD-10-CM | POA: Insufficient documentation

## 2019-08-16 DIAGNOSIS — Z820 Family history of epilepsy and other diseases of the nervous system: Secondary | ICD-10-CM | POA: Insufficient documentation

## 2019-08-16 DIAGNOSIS — Z79899 Other long term (current) drug therapy: Secondary | ICD-10-CM | POA: Diagnosis not present

## 2019-08-16 DIAGNOSIS — I4821 Permanent atrial fibrillation: Secondary | ICD-10-CM | POA: Diagnosis not present

## 2019-08-16 DIAGNOSIS — Z87891 Personal history of nicotine dependence: Secondary | ICD-10-CM | POA: Diagnosis not present

## 2019-08-16 DIAGNOSIS — F419 Anxiety disorder, unspecified: Secondary | ICD-10-CM | POA: Insufficient documentation

## 2019-08-16 DIAGNOSIS — Z7901 Long term (current) use of anticoagulants: Secondary | ICD-10-CM | POA: Diagnosis not present

## 2019-08-16 DIAGNOSIS — Z7982 Long term (current) use of aspirin: Secondary | ICD-10-CM | POA: Insufficient documentation

## 2019-08-16 DIAGNOSIS — G4733 Obstructive sleep apnea (adult) (pediatric): Secondary | ICD-10-CM | POA: Insufficient documentation

## 2019-08-16 DIAGNOSIS — I482 Chronic atrial fibrillation, unspecified: Secondary | ICD-10-CM | POA: Diagnosis not present

## 2019-08-16 DIAGNOSIS — I6523 Occlusion and stenosis of bilateral carotid arteries: Secondary | ICD-10-CM | POA: Diagnosis not present

## 2019-08-16 DIAGNOSIS — Z888 Allergy status to other drugs, medicaments and biological substances status: Secondary | ICD-10-CM | POA: Diagnosis not present

## 2019-08-16 DIAGNOSIS — I2510 Atherosclerotic heart disease of native coronary artery without angina pectoris: Secondary | ICD-10-CM | POA: Diagnosis not present

## 2019-08-16 DIAGNOSIS — M1990 Unspecified osteoarthritis, unspecified site: Secondary | ICD-10-CM | POA: Diagnosis not present

## 2019-08-16 DIAGNOSIS — H9192 Unspecified hearing loss, left ear: Secondary | ICD-10-CM | POA: Diagnosis not present

## 2019-08-16 DIAGNOSIS — E119 Type 2 diabetes mellitus without complications: Secondary | ICD-10-CM | POA: Diagnosis not present

## 2019-08-16 DIAGNOSIS — Z96652 Presence of left artificial knee joint: Secondary | ICD-10-CM | POA: Insufficient documentation

## 2019-08-16 DIAGNOSIS — Z952 Presence of prosthetic heart valve: Secondary | ICD-10-CM | POA: Insufficient documentation

## 2019-08-16 DIAGNOSIS — I5032 Chronic diastolic (congestive) heart failure: Secondary | ICD-10-CM | POA: Insufficient documentation

## 2019-08-16 DIAGNOSIS — Z9841 Cataract extraction status, right eye: Secondary | ICD-10-CM | POA: Insufficient documentation

## 2019-08-16 DIAGNOSIS — Z9842 Cataract extraction status, left eye: Secondary | ICD-10-CM | POA: Insufficient documentation

## 2019-08-16 LAB — CBC
HCT: 43.9 % (ref 39.0–52.0)
Hemoglobin: 13.5 g/dL (ref 13.0–17.0)
MCH: 27.2 pg (ref 26.0–34.0)
MCHC: 30.8 g/dL (ref 30.0–36.0)
MCV: 88.5 fL (ref 80.0–100.0)
Platelets: 169 10*3/uL (ref 150–400)
RBC: 4.96 MIL/uL (ref 4.22–5.81)
RDW: 14.7 % (ref 11.5–15.5)
WBC: 5.3 10*3/uL (ref 4.0–10.5)
nRBC: 0 % (ref 0.0–0.2)

## 2019-08-16 LAB — BASIC METABOLIC PANEL
Anion gap: 10 (ref 5–15)
BUN: 29 mg/dL — ABNORMAL HIGH (ref 8–23)
CO2: 26 mmol/L (ref 22–32)
Calcium: 9.5 mg/dL (ref 8.9–10.3)
Chloride: 102 mmol/L (ref 98–111)
Creatinine, Ser: 1.06 mg/dL (ref 0.61–1.24)
GFR calc Af Amer: >60 mL/min (ref >60)
GFR calc non Af Amer: >60 mL/min (ref >60)
Glucose, Bld: 129 mg/dL — ABNORMAL HIGH (ref 70–99)
Potassium: 4.2 mmol/L (ref 3.5–5.1)
Sodium: 138 mmol/L (ref 135–145)

## 2019-08-16 LAB — BRAIN NATRIURETIC PEPTIDE: B Natriuretic Peptide: 105.3 pg/mL — ABNORMAL HIGH (ref 0.0–100.0)

## 2019-08-16 NOTE — Patient Instructions (Signed)
Labs done today, your results will be available in MyChart, we will contact you for abnormal readings.  Please call our office in November to schedule your follow up appointment.  If you have any questions or concerns before your next appointment please send [place] a message through [place] or call our office at [phone].  At the [facility], you and your health needs are our priority. As part of our continuing mission to provide you with exceptional heart care, we have created designated Provider Care Teams. These Care Teams include your primary Cardiologist (physician) and Advanced Practice Providers (APPs- Physician Assistants and Nurse Practitioners) who all work together to provide you with the care you need, when you need it.   You may see any of the following providers on your designated Care Team at your next follow up: [name] Dr Glori Bickers . Dr Loralie Champagne . Darrick Grinder, NP . Lyda Jester, PA . Audry Riles, PharmD   Please be sure to bring in all your medications bottles to every appointment.

## 2019-08-16 NOTE — Progress Notes (Signed)
Advanced Heart Failure Note   Date:  08/16/2019   ID:  [name] White, DOB 05-30-1935, MRN [id number]  Location: Home  Provider location: [facility] Type of Visit: Established patient  PCP:  Plotnikov, Evie Lacks, MD  Cardiologist:  No primary care provider on file. Primary HF: Enslie Sahota  Chief Complaint: Heart Failure follow-up   History of Present Illness:   HPI: [name] White is a delightful 84 year old male with a history of non-obstructive CAD, micturition syncope, hypertension, hyperlipidemia,borderline diabetes, obesity, sleep apnea, atrial flutter s/p a. flutter ablation by Dr. Lovena Le 06/2008, PAF, CML and severe AS s/p TAVR 04/13/19.   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 Being treated at [facility]. Has lost over 40 pounds. BP down so clonidine stopped.    Echo 10/20 EF 55-60% RV ok. Severe AS mean gradient 78mmHG Small effusion. Mild to mod MR/TR. S/p TAVR on 04/13/19.   Echo 05/17/19 EF 60-65%. TAVR ok. RV ok. Mod TR  Personally reviewed  He presents for routine f/u. Says he feels great. No edema or SOB. Working out in yard without problem. Recently had right retinal detachment. No dizziness.  No bleeding with Eliquis.   Cardiac studies:  R/L cath 12/20   Prox RCA to Mid RCA lesion is 30% stenosed.  RPAV lesion is 30% stenosed.  Ost Cx to Prox Cx lesion is 50% stenosed.  Prox LAD to Mid LAD lesion is 20% stenosed.  Findings:  Ao = 82/51(65)  Lv = 138/8  RA = 5 RV = 38/6 PA = 37/13 (22) PCW = 14  Fick cardiac output/index = 9.8/4.3 PVR = 0.7 WU FA sat = 96% PA sat = 78%, 77% High SVC 75%  AoV = peak gradient 28mm HG, mean gradient 36 mmHG, AVA 1.4 cm2   Past Medical History:  Diagnosis Date  . Anemia 06/04/2015  . Anxiety   . Atrial flutter ([place])    s/p ablation 07/17/08  . Cancer ([place])    leukemia - dx 05/2018  . Chronic diastolic CHF (congestive heart failure) ([place])   . Coronary artery disease   . Depression     . Diabetes mellitus type 2 in obese [facility]) 04/10/2009   Qualifier: Diagnosis of  By: Arnoldo Morale MD, Balinda Quails, diet controlled, lost 60lbs  . Emphysema of lung ([place])   . GERD (gastroesophageal reflux disease)   . Glaucoma    recent dx - has an appt to be evaluated and meds to treat  . H/O hiatal hernia   . Hearing loss    some hearing loss in left ear but has not been dx, no hearing aids  . Hepatitis    h/o of type A  . History of measles as a child   . History of mumps as a child   . Hypertension   . Hypothyroidism   . Micturition syncope   . Osteoarthritis   . Pleural effusion, bilateral   . 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 Severe aortic stenosis   . Sleep apnea    uses CPAP nightly  . Vitamin D deficiency 11/24/2015   Past Surgical History:  Procedure Laterality Date  . CARDIAC CATHETERIZATION  03/18/2019  . CARDIAC ELECTROPHYSIOLOGY MAPPING AND ABLATION  06/2008  . CATARACT EXTRACTION W/ INTRAOCULAR LENS  IMPLANT, BILATERAL  ~ 2007  . COLONOSCOPY    . FOOT  TENDON SURGERY Left   . INTRAOPERATIVE TRANSTHORACIC ECHOCARDIOGRAM N/A 04/13/2019   Procedure: TRANSTHORACIC ECHOCARDIOGRAM;  Surgeon: Sherren Mocha, MD;  Location: [place];  Service: Open Heart Surgery;  Laterality: N/A;  . RIGHT/LEFT HEART CATH AND CORONARY ANGIOGRAPHY N/A 03/18/2019   Procedure: RIGHT/LEFT HEART CATH AND CORONARY ANGIOGRAPHY;  Surgeon: Jolaine Artist, MD;  Location: [facility];  Service: Cardiovascular;  Laterality: N/A;  . TONSILLECTOMY  1940's  . TOTAL KNEE ARTHROPLASTY  1990's   right  . TOTAL KNEE ARTHROPLASTY Left 08/02/2013   Procedure: LEFT TOTAL KNEE ARTHROPLASTY;  Surgeon: Gearlean Alf, MD;  Location: [facility];  Service: Orthopedics;  Laterality: Left;  . TRANSCATHETER AORTIC VALVE REPLACEMENT, TRANSFEMORAL N/A 04/13/2019   Procedure: TRANSCATHETER AORTIC  VALVE REPLACEMENT, TRANSFEMORAL;  Surgeon: Sherren Mocha, MD;  Location: [place];  Service: Open Heart Surgery;  Laterality: N/A;  . UPPER GI ENDOSCOPY    . WISDOM TOOTH EXTRACTION       Current Outpatient Medications  Medication Sig Dispense Refill  . amoxicillin (AMOXIL) 500 MG tablet Take 4 capsules (2,000 mg) one hour prior to all dental visits. 8 tablet 11  . APPLE CIDER VINEGAR PO Take 1 capsule by mouth daily.    [name] aspirin 81 MG chewable tablet Chew 1 tablet (81 mg total) by mouth daily.    [name] CINNAMON PO Take 2,000 mg by mouth 2 (two) times daily.     . cloNIDine (CATAPRES) 0.1 MG tablet Take 1 tablet as needed for systolic blood pressure greater than 160 30 tablet 3  . Coenzyme Q10 ([employer] COQ-10) 200 MG capsule Take 200 mg by mouth every evening.     . dasatinib (SPRYCEL) 100 MG tablet Take 100 mg by mouth every evening.     [name] ELIQUIS 5 MG TABS tablet TAKE 1 TABLET BY MOUTH TWICE A DAY 180 tablet 3  . ergocalciferol (VITAMIN D2) 1.25 MG (50000 UT) capsule Take 50,000 Units by mouth every Monday.    . esomeprazole (NEXIUM) 20 MG capsule Take 20 mg by mouth daily at 12 noon.    . fenofibrate (TRICOR) 145 MG tablet TAKE 1 TABLET BY MOUTH EVERY DAY 90 tablet 1  . ferrous sulfate 325 (65 FE) MG tablet Take 325 mg by mouth every evening.     . finasteride (PROSCAR) 5 MG tablet Take 5 mg by mouth every evening.     . latanoprost (XALATAN) 0.005 % ophthalmic solution 1 drop at bedtime.    [name] levothyroxine (SYNTHROID) 200 MCG tablet Take 200 mcg by mouth daily before breakfast.    . PARoxetine (PAXIL) 40 MG tablet Take 1 tablet (40 mg total) by mouth every morning. 90 tablet 1  . potassium chloride SA (KLOR-CON M20) 20 MEQ tablet Take 2 tablets (40 mEq total) by mouth every evening. 180 tablet 3  . rosuvastatin (CRESTOR) 10 MG tablet Take 10 mg by mouth daily.     [name] spironolactone (ALDACTONE) 25 MG tablet Take 0.5 tablets (12.5 mg total) by mouth daily. 45 tablet 3  . torsemide (DEMADEX) 20 MG  tablet Take 1 tablet (20 mg total) by mouth daily. May take and additional tab in PM 135 tablet 3  . traZODone (DESYREL) 50 MG tablet Take 50 mg by mouth at bedtime.    . TURMERIC PO Take 1,000 mg by mouth every evening.      No current facility-administered medications for this encounter.    Allergies:   Ace inhibitors, Benazepril, Codeine, Statins, Piroxicam, Pseudoephedrine,  Dasatinib, and Betadine [povidone iodine]   Social History:  The patient  reports that he quit smoking about 23 years ago. His smoking use included pipe and cigars. He quit after 0.00 years of use. He quit smokeless tobacco use about 10 years ago.  His smokeless tobacco use included chew. He reports previous alcohol use. He reports that he does not use drugs.   Family History:  The patient's family history includes Heart disease in his maternal grandfather and mother; Hypertension in his son; Multiple sclerosis in his father.   ROS:  Please see the history of present illness.   All other systems are personally reviewed and negative.   Vitals:   08/16/19 0909  BP: 140/90  Pulse: 80  SpO2: 97%     Exam:  General:  Well appearing. No resp difficulty HEENT: normal Neck: supple. no JVD. Carotids 2+ bilat; no bruits. No lymphadenopathy or thryomegaly appreciated. Cor: PMI nondisplaced. Irregular rate & rhythm. No rubs, gallops or murmurs. Lungs: clear Abdomen: soft, nontender, nondistended. No hepatosplenomegaly. No bruits or masses. Good bowel sounds. Extremities: no cyanosis, clubbing, rash, edema Neuro: alert & orientedx3, cranial nerves grossly intact. moves all 4 extremities w/o difficulty. Affect pleasant  ECG: AF 79 LAFB. Personally reviewed   Recent Labs: 04/14/2019: Magnesium 2.0 05/17/2019: B Natriuretic Peptide 160.0; Hemoglobin 11.8; Platelets 166 07/14/2019: ALT 22; BUN 37; Creatinine, Ser 1.11; Potassium 4.0; Sodium 137; TSH 1.25  Personally reviewed   Wt Readings from Last 3 Encounters:    08/16/19 102.4 kg (225 lb 12.8 oz)  07/14/19 106.6 kg (235 lb)  06/17/19 103.4 kg (228 lb)      ASSESSMENT AND PLAN:  1) HTN - Following BP at home closely. Running 125/80-90s. - Has clonidine 0.1 prn for SBP > = 160 but hasn't had to use  2) Chronic diastolic HF in setting of severe AS - Much improved. Volume status ok. NYHA I - Continue torsemide - Check labs today  - ? Need for amyloid w/u. Can consider cMRI as needed  3) Severe AS - s/p TAVR 1/21 - Valve looks good on recent echo - Aware of need for SBE prophylaxis  4) CAD - No current s/s ischemia.  - cath 12/20 with stable non-obstructive CAD - Continue Crestor 10. Not on ASA due to Eliquis. Not on BB with bradycardia  5) CML - [facility] with [facility] Don Broach [facility] - On dasatinib - but currently on hold due to scrotal swelling  6) Atrial fibrillation, permanent - Tikosyn stopped 5/17 due to persistent AF. - Remains in AF today. Rate ok.  - Continue Eliquis 5 bid - Check labs  7) Carotid Stenosis - Carotid duplex 12/2013; stable 0-39% bilateral ICA stenosis - Continue Crestor - F/u as needed  8) OSA - not using CPAP any more with > 50 pound weight loss    Signed, Glori Bickers, MD  08/16/2019 9:47 AM  Advanced Heart Failure [address] [place] [address] [phone] (office) [phone] (fax)

## 2019-09-01 ENCOUNTER — Other Ambulatory Visit (HOSPITAL_COMMUNITY): Payer: Self-pay | Admitting: Internal Medicine

## 2019-09-20 ENCOUNTER — Ambulatory Visit (INDEPENDENT_AMBULATORY_CARE_PROVIDER_SITE_OTHER): Payer: Medicare Other | Admitting: Internal Medicine

## 2019-09-20 ENCOUNTER — Other Ambulatory Visit: Payer: Self-pay

## 2019-09-20 ENCOUNTER — Encounter: Payer: Self-pay | Admitting: Internal Medicine

## 2019-09-20 DIAGNOSIS — I10 Essential (primary) hypertension: Secondary | ICD-10-CM | POA: Diagnosis not present

## 2019-09-20 DIAGNOSIS — N5089 Other specified disorders of the male genital organs: Secondary | ICD-10-CM

## 2019-09-20 DIAGNOSIS — J301 Allergic rhinitis due to pollen: Secondary | ICD-10-CM

## 2019-09-20 DIAGNOSIS — I482 Chronic atrial fibrillation, unspecified: Secondary | ICD-10-CM

## 2019-09-20 DIAGNOSIS — K219 Gastro-esophageal reflux disease without esophagitis: Secondary | ICD-10-CM | POA: Diagnosis not present

## 2019-09-20 MED ORDER — CETIRIZINE HCL 10 MG PO TABS: 10.0000 mg | ORAL_TABLET | Freq: Every day | ORAL | 3 refills | Status: DC

## 2019-09-20 MED ORDER — FLUTICASONE PROPIONATE 50 MCG/ACT NA SUSP: 2.0000 | Freq: Every day | NASAL | 6 refills | Status: DC

## 2019-09-20 NOTE — Assessment & Plan Note (Signed)
Severe scrotal swelling - resolved off Sprycell and re-started on it.

## 2019-09-20 NOTE — Assessment & Plan Note (Signed)
On Nexium 

## 2019-09-20 NOTE — Assessment & Plan Note (Signed)
Claritin did not work Actuary, [facility]

## 2019-09-20 NOTE — Progress Notes (Signed)
Subjective:  Patient ID: [name] White, male    DOB: 1935-04-26  Age: 84 y.o. MRN: [id number]  CC: No chief complaint on file.   HPI [name] White presents for scrotal swelling - resolved off Sprycell and re-started on it. F/u HTN, GERD.c/o allergiers Labs at [facility]  Outpatient Medications Prior to Visit  Medication Sig Dispense Refill  . amoxicillin (AMOXIL) 500 MG tablet Take 4 capsules (2,000 mg) one hour prior to all dental visits. 8 tablet 11  . APPLE CIDER VINEGAR PO Take 1 capsule by mouth daily.    [name] aspirin 81 MG chewable tablet Chew 1 tablet (81 mg total) by mouth daily.    [name] CINNAMON PO Take 2,000 mg by mouth 2 (two) times daily.     . cloNIDine (CATAPRES) 0.1 MG tablet TAKE 1 TABLET AS NEEDED FOR SYSTOLIC BLOOD PRESSURE GREATER THAN 160 90 tablet 1  . Coenzyme Q10 (CVS COQ-10) 200 MG capsule Take 200 mg by mouth every evening.     [name] ELIQUIS 5 MG TABS tablet TAKE 1 TABLET BY MOUTH TWICE A DAY 180 tablet 3  . ergocalciferol (VITAMIN D2) 1.25 MG (50000 UT) capsule Take 50,000 Units by mouth every Monday.    . esomeprazole (NEXIUM) 20 MG capsule Take 20 mg by mouth daily at 12 noon.    . fenofibrate (TRICOR) 145 MG tablet TAKE 1 TABLET BY MOUTH EVERY DAY 90 tablet 1  . ferrous sulfate 325 (65 FE) MG tablet Take 325 mg by mouth every evening.     . finasteride (PROSCAR) 5 MG tablet Take 5 mg by mouth every evening.     . latanoprost (XALATAN) 0.005 % ophthalmic solution 1 drop at bedtime.    [name] levothyroxine (SYNTHROID) 200 MCG tablet Take 200 mcg by mouth daily before breakfast.    . PARoxetine (PAXIL) 40 MG tablet Take 1 tablet (40 mg total) by mouth every morning. 90 tablet 1  . potassium chloride SA (KLOR-CON M20) 20 MEQ tablet Take 2 tablets (40 mEq total) by mouth every evening. 180 tablet 3  . rosuvastatin (CRESTOR) 10 MG tablet Take 10 mg by mouth daily.     [name] spironolactone (ALDACTONE) 25 MG tablet Take 0.5 tablets (12.5 mg total) by mouth daily. 45 tablet 3  .  torsemide (DEMADEX) 20 MG tablet Take 1 tablet (20 mg total) by mouth daily. May take and additional tab in PM 135 tablet 3  . traZODone (DESYREL) 50 MG tablet Take 50 mg by mouth at bedtime.    . TURMERIC PO Take 1,000 mg by mouth every evening.     . dasatinib (SPRYCEL) 100 MG tablet Take 100 mg by mouth every evening.  (Patient not taking: Reported on 09/20/2019)     No facility-administered medications prior to visit.    ROS: Review of Systems  Constitutional: Negative for appetite change, fatigue and unexpected weight change.  HENT: Positive for congestion. Negative for nosebleeds, sneezing, sore throat and trouble swallowing.   Eyes: Negative for itching and visual disturbance.  Respiratory: Negative for cough.   Cardiovascular: Negative for chest pain, palpitations and leg swelling.  Gastrointestinal: Negative for abdominal distention, blood in stool, diarrhea and nausea.  Genitourinary: Negative for frequency and hematuria.  Musculoskeletal: Negative for back pain, gait problem, joint swelling and neck pain.  Skin: Negative for rash.  Neurological: Negative for dizziness, tremors, speech difficulty and weakness.  Psychiatric/Behavioral: Negative for agitation, dysphoric mood and sleep disturbance. The patient is not  nervous/anxious.     Objective:  BP (!) 150/80   Pulse 88   Temp 98 F (36.7 C) (Oral)   Ht 6\' 2"  (1.88 m)   Wt 221 lb (100.2 kg)   SpO2 97%   BMI 28.37 kg/m   BP Readings from Last 3 Encounters:  09/20/19 (!) 150/80  08/16/19 140/90  07/14/19 132/76    Wt Readings from Last 3 Encounters:  09/20/19 221 lb (100.2 kg)  08/16/19 225 lb 12.8 oz (102.4 kg)  07/14/19 235 lb (106.6 kg)    Physical Exam Constitutional:      General: He is not in acute distress.    Appearance: He is well-developed.     Comments: NAD  Eyes:     Conjunctiva/sclera: Conjunctivae normal.     Pupils: Pupils are equal, round, and reactive to light.  Neck:     Thyroid: No  thyromegaly.     Vascular: No JVD.  Cardiovascular:     Rate and Rhythm: Normal rate. Rhythm irregular.     Heart sounds: Normal heart sounds. No murmur heard.  No friction rub. No gallop.   Pulmonary:     Effort: Pulmonary effort is normal. No respiratory distress.     Breath sounds: Normal breath sounds. No wheezing or rales.  Chest:     Chest wall: No tenderness.  Abdominal:     General: Bowel sounds are normal. There is no distension.     Palpations: Abdomen is soft. There is no mass.     Tenderness: There is no abdominal tenderness. There is no guarding or rebound.  Musculoskeletal:        General: No tenderness. Normal range of motion.     Cervical back: Normal range of motion.  Lymphadenopathy:     Cervical: No cervical adenopathy.  Skin:    General: Skin is warm and dry.     Findings: No rash.  Neurological:     Mental Status: He is alert and oriented to person, place, and time.     Cranial Nerves: No cranial nerve deficit.     Motor: No abnormal muscle tone.     Coordination: Coordination normal.     Gait: Gait normal.     Deep Tendon Reflexes: Reflexes are normal and symmetric.  Psychiatric:        Behavior: Behavior normal.        Thought Content: Thought content normal.        Judgment: Judgment normal.     Lab Results  Component Value Date   WBC 5.3 08/16/2019   HGB 13.5 08/16/2019   HCT 43.9 08/16/2019   PLT 169 08/16/2019   GLUCOSE 129 (H) 08/16/2019   CHOL 171 01/22/2018   TRIG 98 01/22/2018   HDL 57 01/22/2018   LDLDIRECT 141.6 11/12/2010   LDLCALC 94 01/22/2018   ALT 22 07/14/2019   AST 40 (H) 07/14/2019   NA 138 08/16/2019   K 4.2 08/16/2019   CL 102 08/16/2019   CREATININE 1.06 08/16/2019   BUN 29 (H) 08/16/2019   CO2 26 08/16/2019   TSH 1.25 07/14/2019   PSA 1.42 09/13/2013   INR 1.1 04/09/2019   HGBA1C 6.3 07/14/2019   MICROALBUR 6.0 (H) 01/10/2017    No results found.  Assessment & Plan:   Walker Kehr, MD

## 2019-09-20 NOTE — Assessment & Plan Note (Signed)
Eliquis 

## 2019-09-20 NOTE — Assessment & Plan Note (Addendum)
Spironolactone, torsemide Check BP at home Has a Rx for Clonidine prn. F/u w/Dr Bensimhon Off other BP meds

## 2019-09-24 ENCOUNTER — Other Ambulatory Visit (HOSPITAL_COMMUNITY): Payer: Self-pay | Admitting: Internal Medicine

## 2019-09-24 DIAGNOSIS — H43812 Vitreous degeneration, left eye: Secondary | ICD-10-CM | POA: Diagnosis not present

## 2019-09-24 DIAGNOSIS — H35351 Cystoid macular degeneration, right eye: Secondary | ICD-10-CM | POA: Diagnosis not present

## 2019-09-24 DIAGNOSIS — H31091 Other chorioretinal scars, right eye: Secondary | ICD-10-CM | POA: Diagnosis not present

## 2019-10-03 ENCOUNTER — Other Ambulatory Visit (HOSPITAL_COMMUNITY): Payer: Self-pay | Admitting: Internal Medicine

## 2019-10-21 DIAGNOSIS — T8521XA Breakdown (mechanical) of intraocular lens, initial encounter: Secondary | ICD-10-CM | POA: Diagnosis not present

## 2019-10-21 DIAGNOSIS — H43812 Vitreous degeneration, left eye: Secondary | ICD-10-CM | POA: Diagnosis not present

## 2019-10-21 DIAGNOSIS — H35351 Cystoid macular degeneration, right eye: Secondary | ICD-10-CM | POA: Diagnosis not present

## 2019-10-27 ENCOUNTER — Other Ambulatory Visit (HOSPITAL_COMMUNITY): Payer: Self-pay | Admitting: Internal Medicine

## 2019-11-03 DIAGNOSIS — T8522XA Displacement of intraocular lens, initial encounter: Secondary | ICD-10-CM | POA: Diagnosis not present

## 2019-11-10 DIAGNOSIS — H35351 Cystoid macular degeneration, right eye: Secondary | ICD-10-CM | POA: Diagnosis not present

## 2019-11-17 DIAGNOSIS — T8522XA Displacement of intraocular lens, initial encounter: Secondary | ICD-10-CM | POA: Diagnosis not present

## 2019-12-08 ENCOUNTER — Other Ambulatory Visit (HOSPITAL_COMMUNITY): Payer: Self-pay

## 2019-12-08 DIAGNOSIS — I5022 Chronic systolic (congestive) heart failure: Secondary | ICD-10-CM

## 2019-12-08 MED ORDER — APIXABAN 5 MG PO TABS: 5.0000 mg | ORAL_TABLET | Freq: Two times a day (BID) | ORAL | 3 refills | Status: DC

## 2019-12-13 DIAGNOSIS — H401131 Primary open-angle glaucoma, bilateral, mild stage: Secondary | ICD-10-CM | POA: Diagnosis not present

## 2019-12-22 ENCOUNTER — Ambulatory Visit (INDEPENDENT_AMBULATORY_CARE_PROVIDER_SITE_OTHER): Payer: Medicare Other

## 2019-12-22 ENCOUNTER — Ambulatory Visit (INDEPENDENT_AMBULATORY_CARE_PROVIDER_SITE_OTHER): Payer: Medicare Other | Admitting: Internal Medicine

## 2019-12-22 ENCOUNTER — Encounter: Payer: Self-pay | Admitting: Internal Medicine

## 2019-12-22 ENCOUNTER — Other Ambulatory Visit: Payer: Self-pay

## 2019-12-22 VITALS — BP 130/86 | HR 86 | Temp 98.2°F | Ht 74.0 in | Wt 232.0 lb

## 2019-12-22 DIAGNOSIS — I2510 Atherosclerotic heart disease of native coronary artery without angina pectoris: Secondary | ICD-10-CM

## 2019-12-22 DIAGNOSIS — Z0000 Encounter for general adult medical examination without abnormal findings: Secondary | ICD-10-CM | POA: Diagnosis not present

## 2019-12-22 DIAGNOSIS — E1169 Type 2 diabetes mellitus with other specified complication: Secondary | ICD-10-CM | POA: Diagnosis not present

## 2019-12-22 DIAGNOSIS — C9210 Chronic myeloid leukemia, BCR/ABL-positive, not having achieved remission: Secondary | ICD-10-CM

## 2019-12-22 DIAGNOSIS — I482 Chronic atrial fibrillation, unspecified: Secondary | ICD-10-CM

## 2019-12-22 DIAGNOSIS — J301 Allergic rhinitis due to pollen: Secondary | ICD-10-CM

## 2019-12-22 DIAGNOSIS — E669 Obesity, unspecified: Secondary | ICD-10-CM

## 2019-12-22 DIAGNOSIS — E559 Vitamin D deficiency, unspecified: Secondary | ICD-10-CM

## 2019-12-22 DIAGNOSIS — M609 Myositis, unspecified: Secondary | ICD-10-CM | POA: Insufficient documentation

## 2019-12-22 DIAGNOSIS — T466X5A Adverse effect of antihyperlipidemic and antiarteriosclerotic drugs, initial encounter: Secondary | ICD-10-CM

## 2019-12-22 DIAGNOSIS — E785 Hyperlipidemia, unspecified: Secondary | ICD-10-CM

## 2019-12-22 MED ORDER — CETIRIZINE HCL 10 MG PO TABS: 10.0000 mg | ORAL_TABLET | Freq: Every day | ORAL | 3 refills | Status: DC

## 2019-12-22 NOTE — Assessment & Plan Note (Signed)
Zyrtec  

## 2019-12-22 NOTE — Assessment & Plan Note (Signed)
On Dasatinib po - swollen scrotum - better on intermittent Rx 

## 2019-12-22 NOTE — Progress Notes (Addendum)
Subjective:   [name] White is a 84 y.o. male who presents for Medicare Annual/Subsequent preventive examination.  Review of Systems    No ROS. Medicare Wellness Visit. Cardiac Risk Factors include: advanced age (>64men, >76 women);diabetes mellitus;dyslipidemia;hypertension;family history of premature cardiovascular disease;male gender     Objective:    Today's Vitals   12/22/19 1032  BP: 130/86  Pulse: 86  Temp: 98.2 F (36.8 C)  SpO2: 97%  Weight: 232 lb (105.2 kg)  Height: 6\' 2"  (1.88 m)   Body mass index is 29.79 kg/m.  Advanced Directives 12/22/2019 04/13/2019 04/09/2019 03/31/2019 03/18/2019 03/09/2019 05/30/2017  Does Patient Have a Medical Advance Directive? Yes Yes Yes Yes Yes Yes No  Type of Advance Directive Healthcare Power of Attorney Living will Living will [place];Living will Healthcare Power of [place];Living will -  Does patient want to make changes to medical advance directive? No - Patient declined No - Patient declined - - No - Patient declined - -  Copy of [place] in Chart? No - copy requested - - - No - copy requested - -  Would patient like information on creating a medical advance directive? - - - - - - No - Patient declined  Pre-existing out of facility DNR order (yellow form or pink MOST form) - - - - - - -    Current Medications (verified) Outpatient Encounter Medications as of 12/22/2019  Medication Sig   amoxicillin (AMOXIL) 500 MG tablet Take 4 capsules (2,000 mg) one hour prior to all dental visits.   apixaban (ELIQUIS) 5 MG TABS tablet Take 1 tablet (5 mg total) by mouth 2 (two) times daily.   APPLE CIDER VINEGAR PO Take 1 capsule by mouth daily.   aspirin 81 MG chewable tablet Chew 1 tablet (81 mg total) by mouth daily.   cetirizine (ZYRTEC) 10 MG tablet Take 1 tablet (10 mg total) by mouth daily.   CINNAMON PO Take 2,000 mg by mouth 2 (two) times daily.    cloNIDine  (CATAPRES) 0.1 MG tablet TAKE 1 TABLET AS NEEDED FOR SYSTOLIC BLOOD PRESSURE GREATER THAN 160   Coenzyme Q10 (CVS COQ-10) 200 MG capsule Take 200 mg by mouth every evening.    ergocalciferol (VITAMIN D2) 1.25 MG (50000 UT) capsule Take 50,000 Units by mouth every Monday.   esomeprazole (NEXIUM) 20 MG capsule Take 20 mg by mouth daily at 12 noon.   fenofibrate (TRICOR) 145 MG tablet TAKE 1 TABLET BY MOUTH EVERY DAY   ferrous sulfate 325 (65 FE) MG tablet Take 325 mg by mouth every evening.    finasteride (PROSCAR) 5 MG tablet Take 5 mg by mouth every evening.    fluticasone (FLONASE) 50 MCG/ACT nasal spray Place 2 sprays into both nostrils daily.   latanoprost (XALATAN) 0.005 % ophthalmic solution 1 drop at bedtime.   levothyroxine (SYNTHROID) 200 MCG tablet Take 200 mcg by mouth daily before breakfast.   PARoxetine (PAXIL) 40 MG tablet Take 1 tablet (40 mg total) by mouth every morning.   potassium chloride SA (KLOR-CON M20) 20 MEQ tablet Take 1 tablet (20 mEq total) by mouth daily.   rosuvastatin (CRESTOR) 10 MG tablet TAKE 1 TABLET BY MOUTH EVERY DAY   spironolactone (ALDACTONE) 25 MG tablet Take 0.5 tablets (12.5 mg total) by mouth daily.   torsemide (DEMADEX) 20 MG tablet TAKE 1 TABLET (20 MG TOTAL) BY MOUTH DAILY. MAY TAKE AND ADDITIONAL TAB IN  THE EVENING   traZODone (DESYREL) 50 MG tablet Take 50 mg by mouth at bedtime.   TURMERIC PO Take 1,000 mg by mouth every evening.    No facility-administered encounter medications on file as of 12/22/2019.    Allergies (verified) Ace inhibitors, Benazepril, Codeine, Statins, Piroxicam, Pseudoephedrine, Dasatinib, and Betadine [povidone iodine]   History: Past Medical History:  Diagnosis Date   Anemia 06/04/2015   Anxiety    Atrial flutter ([place])    s/p ablation 07/17/08   Cancer ([place])    leukemia - dx 05/2018   Chronic diastolic CHF (congestive heart failure) ([place])    Coronary artery disease    Depression    Diabetes mellitus type 2 in  obese ([place]) 04/10/2009   Qualifier: Diagnosis of  By: Arnoldo Morale MD, Balinda Quails, diet controlled, lost 60lbs   Emphysema of lung (H[facility])    GERD (gastroesophageal reflux disease)    Glaucoma    recent dx - has an appt to be evaluated and meds to treat   H/O hiatal hernia    Hearing loss    some hearing loss in left ear but has not been dx, no hearing aids   Hepatitis    h/o of type A   History of measles as a child    History of mumps as a child    Hypertension    Hypothyroidism    Micturition syncope    Osteoarthritis    Pleural effusion, bilateral    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   26 mm Edwards Sapien 3 transcatheter heart valve placed via percutaneous right transfemoral approach    Severe aortic stenosis    Sleep apnea    uses CPAP nightly   Vitamin D deficiency 11/24/2015   Past Surgical History:  Procedure Laterality Date   CARDIAC CATHETERIZATION  03/18/2019   CARDIAC ELECTROPHYSIOLOGY MAPPING AND ABLATION  06/2008   CATARACT EXTRACTION W/ INTRAOCULAR LENS  IMPLANT, BILATERAL  ~ 2007   COLONOSCOPY     FOOT TENDON SURGERY Left    INTRAOPERATIVE TRANSTHORACIC ECHOCARDIOGRAM N/A 04/13/2019   Procedure: TRANSTHORACIC ECHOCARDIOGRAM;  Surgeon: Sherren Mocha, MD;  Location: [facility];  Service: Open Heart Surgery;  Laterality: N/A;   RIGHT/LEFT HEART CATH AND CORONARY ANGIOGRAPHY N/A 03/18/2019   Procedure: RIGHT/LEFT HEART CATH AND CORONARY ANGIOGRAPHY;  Surgeon: Jolaine Artist, MD;  Location: [facility];  Service: Cardiovascular;  Laterality: N/A;   TONSILLECTOMY  1940's   TOTAL KNEE ARTHROPLASTY  1990's   right   TOTAL KNEE ARTHROPLASTY Left 08/02/2013   Procedure: LEFT TOTAL KNEE ARTHROPLASTY;  Surgeon: Gearlean Alf, MD;  Location: [facility];  Service: Orthopedics;  Laterality: Left;   TRANSCATHETER AORTIC VALVE REPLACEMENT, TRANSFEMORAL N/A 04/13/2019   Procedure: TRANSCATHETER AORTIC VALVE REPLACEMENT, TRANSFEMORAL;  Surgeon: Sherren Mocha,  MD;  Location: [facility];  Service: Open Heart Surgery;  Laterality: N/A;   UPPER GI ENDOSCOPY     WISDOM TOOTH EXTRACTION     Family History  Problem Relation Age of Onset   Multiple sclerosis Father    Heart disease Mother        atrial fibrillation   Hypertension Son    Heart disease Maternal Grandfather    Social History   Socioeconomic History   Marital status: Married    Spouse name: Not on file   Number of children: Not on file   Years of education: Not on file   Highest education level: Not on file  Occupational History  Not on file  Tobacco Use   Smoking status: Former Smoker    Years: 0.00    Types: Pipe, Cigars    Quit date: 04/01/1996    Years since quitting: 23.7   Smokeless tobacco: Former Systems developer    Types: [place] date: 04/01/2009  Vaping Use   Vaping Use: Never used  Substance and Sexual Activity   Alcohol use: Not Currently    Alcohol/week: 0.0 standard drinks    Comment: Quit wine early 2020.   Drug use: No   Sexual activity: Not Currently    Comment: lives with wife, eats clean diet. No major dietary restrtictions,   Other Topics Concern   Not on file  Social History Narrative   Not on file   Social Determinants of Health   Financial Resource Strain: Low Risk    Difficulty of Paying Living Expenses: Not hard at all  Food Insecurity: No Food Insecurity   Worried About Charity fundraiser in the Last Year: Never true   [place] in the Last Year: Never true  Transportation Needs: No Transportation Needs   Lack of Transportation (Medical): No   Lack of Transportation (Non-Medical): No  Physical Activity: Sufficiently Active   Days of Exercise per Week: 5 days   Minutes of Exercise per Session: 30 min  Stress: No Stress Concern Present   Feeling of Stress : Not at all  Social Connections: Socially Integrated   Frequency of Communication with Friends and Family: More than three times a week   Frequency of Social Gatherings with Friends and  Family: More than three times a week   Attends Religious Services: More than 4 times per year   Active Member of [employer] or Organizations: Yes   Attends Music therapist: More than 4 times per year   Marital Status: Married    Tobacco Counseling Counseling given: Not Answered   Clinical Intake:  Pre-visit preparation completed: Yes  Pain : No/denies pain     BMI - recorded: 29.79 Nutritional Status: BMI 25 -29 Overweight Nutritional Risks: None Diabetes: Yes CBG done?: No Did pt. bring in CBG monitor from home?: No  How often do you need to have someone help you when you read instructions, pamphlets, or other written materials from your doctor or pharmacy?: 1 - Never  Diabetic? yes  Interpreter Needed?: No  Information entered by :: Shazia Mitchener N. Johnda Billiot, LPN   Activities of Daily Living In your present state of health, do you have any difficulty performing the following activities: 12/22/2019 04/14/2019  Hearing? Y Y  Comment - -  Vision? N N  Difficulty concentrating or making decisions? N N  Walking or climbing stairs? N Y  Dressing or bathing? N N  Doing errands, shopping? N N  Preparing Food and eating ? N -  In the past six months, have you accidently leaked urine? N -  Do you have problems with loss of bowel control? N -  Managing your Medications? N -  Managing your Finances? N -  Housekeeping or managing your Housekeeping? N -  Some recent data might be hidden    Patient Care Team: Plotnikov, Evie Lacks, MD as PCP - General (Internal Medicine) Bensimhon, Shaune Pascal, MD as PCP - Advanced Heart Failure (Cardiology) Mercy Moore, OD as Consulting Physician (Optometry) Bensimhon, Shaune Pascal, MD as Consulting Physician (Cardiology) Gaynelle Arabian, MD as Consulting Physician (Orthopedic Surgery) Kerrie Buffalo, MD as Consulting Physician (Cardiology)  Henreitta Cea, MD as Consulting Physician (Family Medicine) Garry Heater, DDS  (Dentistry) Sherren Mocha, MD as Consulting Physician (Cardiology) Evans Lance, MD as Consulting Physician (Cardiology)  Indicate any recent Medical Services you may have received from other than [facility] providers in the past year (date may be approximate).     Assessment:   This is a routine wellness examination for D[place].  Hearing/Vision screen No exam data present  Dietary issues and exercise activities discussed: Current Exercise Habits: Home exercise routine, Type of exercise: walking;Other - see comments (yard work and hunting), Time (Minutes): 30, Frequency (Times/Week): 5, Weekly Exercise (Minutes/Week): 150, Intensity: Mild, Exercise limited by: cardiac condition(s);psychological condition(s);respiratory conditions(s)  Goals       Weight (lb) < 240 lb (108.9 kg) (pt-stated)       Depression Screen PHQ 2/9 Scores 12/22/2019 12/22/2019 09/21/2019 09/20/2019 01/10/2017 07/29/2016 11/24/2015  PHQ - 2 Score 0 0 0 0 0 0 0  PHQ- 9 Score - - 0 - - - -    Fall Risk Fall Risk  12/22/2019 09/20/2019 01/10/2017 07/29/2016 11/24/2015  Falls in the past year? 0 0 No No No  Number falls in past yr: 0 0 - - -  Injury with Fall? 0 0 - - -  Risk for fall due to : Impaired balance/gait;Impaired vision - - - -  Follow up Falls evaluation completed;Education provided - - - -    Any stairs in or around the home? Yes  If so, are there any without handrails? No  Home free of loose throw rugs in walkways, pet beds, electrical cords, etc? Yes  Adequate lighting in your home to reduce risk of falls? Yes   ASSISTIVE DEVICES UTILIZED TO PREVENT FALLS:  Life alert? No  Use of a cane, walker or w/c? Yes  Grab bars in the bathroom? Yes  Shower chair or bench in shower? Yes  Elevated toilet seat or a handicapped toilet? Yes   TIMED UP AND GO:  Was the test performed? No .  Length of time to ambulate 10 feet: 0 sec.   Gait steady and fast with assistive device  Cognitive Function: MMSE -  Mini Mental State Exam 07/29/2016  Orientation to time 5  Orientation to Place 5  Registration 3  Attention/ Calculation 4  Recall 3  Language- name 2 objects 2  Language- repeat 1  Language- follow 3 step command 3  Language- read & follow direction 1  Write a sentence 1  Copy design 1  Total score 29     6CIT Screen 12/22/2019  What Year? 0 points  What month? 0 points  What time? 0 points  Count back from 20 0 points  Months in reverse 0 points  Repeat phrase 0 points  Total Score 0    Immunizations Immunization History  Administered Date(s) Administered   Influenza Split 12/31/2011   Influenza Whole 01/11/2009, 02/08/2010   Influenza, High Dose Seasonal PF 01/12/2013, 11/24/2015, 01/10/2017   Influenza,inj,Quad PF,6+ Mos 02/01/2014, 12/15/2014   Influenza-Unspecified 12/18/2017, 12/19/2018   Moderna SARS-COVID-2 Vaccination 05/04/2019, 06/02/2019   Pneumococcal Conjugate-13 11/24/2015   Pneumococcal Polysaccharide-23 10/29/2008   Td 04/30/2008    TDAP status: Due, Education has been provided regarding the importance of this vaccine. Advised may receive this vaccine at local pharmacy or Health Dept. Aware to provide a copy of the vaccination record if obtained from local pharmacy or Health Dept. Verbalized acceptance and understanding. Flu Vaccine status: Up to date Pneumococcal vaccine status: Up  to date Covid-19 vaccine status: Completed vaccines  Qualifies for Shingles Vaccine? Yes   Zostavax completed No   Shingrix Completed?: No.    Education has been provided regarding the importance of this vaccine. Patient has been advised to call insurance company to determine out of pocket expense if they have not yet received this vaccine. Advised may also receive vaccine at local pharmacy or Health Dept. Verbalized acceptance and understanding.  Screening Tests Health Maintenance  Topic Date Due   FOOT EXAM  07/29/2017   TETANUS/TDAP  04/30/2018   OPHTHALMOLOGY EXAM   11/20/2018   INFLUENZA VACCINE  10/31/2019   HEMOGLOBIN A1C  01/13/2020   COVID-19 Vaccine  Completed   PNA vac Low Risk Adult  Completed    Health Maintenance  Health Maintenance Due  Topic Date Due   FOOT EXAM  07/29/2017   TETANUS/TDAP  04/30/2018   OPHTHALMOLOGY EXAM  11/20/2018   INFLUENZA VACCINE  10/31/2019    Colorectal cancer screening: No longer required.   Lung Cancer Screening: (Low Dose CT Chest recommended if Age 21-80 years, 30 pack-year currently smoking OR have quit w/in 15years.) does not qualify.   Lung Cancer Screening Referral: no  Additional Screening:  Hepatitis C Screening: does not qualify; Completed no  Vision Screening: Recommended annual ophthalmology exams for early detection of glaucoma and other disorders of the eye. Is the patient up to date with their annual eye exam?  Yes  Who is the provider or what is the name of the office in which the patient attends annual eye exams? [facility], MD If pt is not established with a provider, would they like to be referred to a provider to establish care? No .   Dental Screening: Recommended annual dental exams for proper oral hygiene  Community Resource Referral / Chronic Care Management: CRR required this visit?  No   CCM required this visit?  No      Plan:     I have personally reviewed and noted the following in the patient's chart:   Medical and social history Use of alcohol, tobacco or illicit drugs  Current medications and supplements Functional ability and status Nutritional status Physical activity Advanced directives List of other physicians Hospitalizations, surgeries, and ER visits in previous 12 months Vitals Screenings to include cognitive, depression, and falls Referrals and appointments  In addition, I have reviewed and discussed with patient certain preventive protocols, quality metrics, and best practice recommendations. A written personalized care plan for  preventive services as well as general preventive health recommendations were provided to patient.     Sheral Flow, LPN   7/00/1749   Nurse Notes: n/a Medical screening examination/treatment/procedure(s) were performed by non-physician practitioner and as supervising physician I was immediately available for consultation/collaboration.  I agree with above. Lew Dawes, MD

## 2019-12-22 NOTE — Patient Instructions (Signed)
You need a tDAP vaccine at the [facility]

## 2019-12-22 NOTE — Assessment & Plan Note (Signed)
Off statins 

## 2019-12-22 NOTE — Assessment & Plan Note (Signed)
No CP 

## 2019-12-22 NOTE — Assessment & Plan Note (Signed)
Krill oil 

## 2019-12-22 NOTE — Assessment & Plan Note (Addendum)
Wt Readings from Last 3 Encounters:  12/22/19 232 lb (105.2 kg)  09/20/19 221 lb (100.2 kg)  08/16/19 225 lb 12.8 oz (102.4 kg)   Labs at the [facility]

## 2019-12-22 NOTE — Assessment & Plan Note (Signed)
On Vit D 

## 2019-12-22 NOTE — Progress Notes (Signed)
Subjective:  Patient ID: [name] White, male    DOB: 23-Aug-1935  Age: 84 y.o. MRN: [id number]  CC: No chief complaint on file.   HPI Corin Tilly presents for A fib, CAD, swollen scrotum - better on intermittent Rx, DM, CML f/u  Outpatient Medications Prior to Visit  Medication Sig Dispense Refill  . amoxicillin (AMOXIL) 500 MG tablet Take 4 capsules (2,000 mg) one hour prior to all dental visits. 8 tablet 11  . apixaban (ELIQUIS) 5 MG TABS tablet Take 1 tablet (5 mg total) by mouth 2 (two) times daily. 180 tablet 3  . APPLE CIDER VINEGAR PO Take 1 capsule by mouth daily.    [name] aspirin 81 MG chewable tablet Chew 1 tablet (81 mg total) by mouth daily.    . cetirizine (ZYRTEC) 10 MG tablet Take 1 tablet (10 mg total) by mouth daily. 90 tablet 3  . CINNAMON PO Take 2,000 mg by mouth 2 (two) times daily.     . cloNIDine (CATAPRES) 0.1 MG tablet TAKE 1 TABLET AS NEEDED FOR SYSTOLIC BLOOD PRESSURE GREATER THAN 160 90 tablet 1  . Coenzyme Q10 (CVS COQ-10) 200 MG capsule Take 200 mg by mouth every evening.     . dasatinib (SPRYCEL) 50 MG tablet Take 50 mg by mouth daily.    . ergocalciferol (VITAMIN D2) 1.25 MG (50000 UT) capsule Take 50,000 Units by mouth every Monday.    . esomeprazole (NEXIUM) 20 MG capsule Take 20 mg by mouth daily at 12 noon.    . fenofibrate (TRICOR) 145 MG tablet TAKE 1 TABLET BY MOUTH EVERY DAY 90 tablet 1  . ferrous sulfate 325 (65 FE) MG tablet Take 325 mg by mouth every evening.     . finasteride (PROSCAR) 5 MG tablet Take 5 mg by mouth every evening.     . fluticasone (FLONASE) 50 MCG/ACT nasal spray Place 2 sprays into both nostrils daily. 16 g 6  . latanoprost (XALATAN) 0.005 % ophthalmic solution 1 drop at bedtime.    [name] levothyroxine (SYNTHROID) 200 MCG tablet Take 200 mcg by mouth daily before breakfast.    . PARoxetine (PAXIL) 40 MG tablet Take 1 tablet (40 mg total) by mouth every morning. 90 tablet 1  . potassium chloride SA (KLOR-CON M20) 20 MEQ  tablet Take 1 tablet (20 mEq total) by mouth daily. 90 tablet 1  . rosuvastatin (CRESTOR) 10 MG tablet TAKE 1 TABLET BY MOUTH EVERY DAY 90 tablet 3  . spironolactone (ALDACTONE) 25 MG tablet Take 0.5 tablets (12.5 mg total) by mouth daily. 45 tablet 3  . torsemide (DEMADEX) 20 MG tablet TAKE 1 TABLET (20 MG TOTAL) BY MOUTH DAILY. MAY TAKE AND ADDITIONAL TAB IN THE EVENING 220 tablet 3  . traZODone (DESYREL) 50 MG tablet Take 50 mg by mouth at bedtime.    . TURMERIC PO Take 1,000 mg by mouth every evening.      No facility-administered medications prior to visit.    ROS: Review of Systems  Constitutional: Positive for unexpected weight change. Negative for appetite change and fatigue.  HENT: Negative for congestion, nosebleeds, sneezing, sore throat and trouble swallowing.   Eyes: Negative for itching and visual disturbance.  Respiratory: Negative for cough.   Cardiovascular: Negative for chest pain, palpitations and leg swelling.  Gastrointestinal: Negative for abdominal distention, blood in stool, diarrhea and nausea.  Genitourinary: Negative for frequency and hematuria.  Musculoskeletal: Negative for back pain, gait problem, joint swelling and neck  pain.  Skin: Negative for rash.  Neurological: Negative for dizziness, tremors, speech difficulty and weakness.  Psychiatric/Behavioral: Negative for agitation, dysphoric mood and sleep disturbance. The patient is not nervous/anxious.     Objective:  BP 130/86   Pulse 86   Temp 98.2 F (36.8 C) (Oral)   Ht 6\' 2"  (1.88 m)   Wt 232 lb (105.2 kg)   SpO2 97%   BMI 29.79 kg/m   BP Readings from Last 3 Encounters:  12/22/19 130/86  09/20/19 (!) 150/80  08/16/19 140/90    Wt Readings from Last 3 Encounters:  12/22/19 232 lb (105.2 kg)  09/20/19 221 lb (100.2 kg)  08/16/19 225 lb 12.8 oz (102.4 kg)    Physical Exam Constitutional:      General: He is not in acute distress.    Appearance: He is well-developed. He is obese.      Comments: NAD  Eyes:     Conjunctiva/sclera: Conjunctivae normal.     Pupils: Pupils are equal, round, and reactive to light.  Neck:     Thyroid: No thyromegaly.     Vascular: No JVD.  Cardiovascular:     Rate and Rhythm: Normal rate and regular rhythm.     Heart sounds: Normal heart sounds. No murmur heard.  No friction rub. No gallop.   Pulmonary:     Effort: Pulmonary effort is normal. No respiratory distress.     Breath sounds: Normal breath sounds. No wheezing or rales.  Chest:     Chest wall: No tenderness.  Abdominal:     General: Bowel sounds are normal. There is no distension.     Palpations: Abdomen is soft. There is no mass.     Tenderness: There is no abdominal tenderness. There is no guarding or rebound.  Musculoskeletal:        General: No tenderness. Normal range of motion.     Cervical back: Normal range of motion.  Lymphadenopathy:     Cervical: No cervical adenopathy.  Skin:    General: Skin is warm and dry.     Findings: No rash.  Neurological:     Mental Status: He is alert and oriented to person, place, and time.     Cranial Nerves: No cranial nerve deficit.     Motor: No abnormal muscle tone.     Coordination: Coordination normal.     Gait: Gait normal.     Deep Tendon Reflexes: Reflexes are normal and symmetric.  Psychiatric:        Behavior: Behavior normal.        Thought Content: Thought content normal.        Judgment: Judgment normal.     Lab Results  Component Value Date   WBC 5.3 08/16/2019   HGB 13.5 08/16/2019   HCT 43.9 08/16/2019   PLT 169 08/16/2019   GLUCOSE 129 (H) 08/16/2019   CHOL 171 01/22/2018   TRIG 98 01/22/2018   HDL 57 01/22/2018   LDLDIRECT 141.6 11/12/2010   LDLCALC 94 01/22/2018   ALT 22 07/14/2019   AST 40 (H) 07/14/2019   NA 138 08/16/2019   K 4.2 08/16/2019   CL 102 08/16/2019   CREATININE 1.06 08/16/2019   BUN 29 (H) 08/16/2019   CO2 26 08/16/2019   TSH 1.25 07/14/2019   PSA 1.42 09/13/2013   INR 1.1  04/09/2019   HGBA1C 6.3 07/14/2019   MICROALBUR 6.0 (H) 01/10/2017    No results found.  Assessment & Plan:  Walker Kehr, MD

## 2019-12-22 NOTE — Patient Instructions (Signed)
[name] , Thank you for taking time to come for your Medicare Wellness Visit. I appreciate your ongoing commitment to your health goals. Please review the following plan we discussed and let me know if I can assist you in the future.   Screening recommendations/referrals: Colonoscopy: 12/05/2015; no candidate for repeat screening due to age. Recommended yearly ophthalmology/optometry visit for glaucoma screening and checkup Recommended yearly dental visit for hygiene and checkup  Vaccinations: Influenza vaccine: 12/19/2018 Pneumococcal vaccine: up to date Tdap vaccine: 04/30/2008; due every 10 years Shingles vaccine: declined Covid-19: completed (Moderna)  Advanced directives: Please bring a copy of your health care power of attorney and living will to the office at your convenience.  Conditions/risks identified: Yes; Reviewed health maintenance screenings with patient today and relevant education, vaccines, and/or referrals were provided. Please continue to do your personal lifestyle choices by: daily care of teeth and gums, regular physical activity (goal should be 5 days a week for 30 minutes), eat a healthy diet, avoid tobacco and drug use, limiting any alcohol intake, taking a low-dose aspirin (if not allergic or have been advised by your provider otherwise) and taking vitamins and minerals as recommended by your provider. Continue doing brain stimulating activities (puzzles, reading, adult coloring books, staying active) to keep memory sharp. Continue to eat heart healthy diet (full of fruits, vegetables, whole grains, lean protein, water--limit salt, fat, and sugar intake) and increase physical activity as tolerated.  Next appointment: Please schedule your next Medicare Wellness Visit with your Nurse Health Advisor in 1 year.   Preventive Care 82 Years and Older, Male Preventive care refers to lifestyle choices and visits with your health care provider that can promote health and  wellness. What does preventive care include?  A yearly physical exam. This is also called an annual well check.  Dental exams once or twice a year.  Routine eye exams. Ask your health care provider how often you should have your eyes checked.  Personal lifestyle choices, including:  Daily care of your teeth and gums.  Regular physical activity.  Eating a healthy diet.  Avoiding tobacco and drug use.  Limiting alcohol use.  Practicing safe sex.  Taking low doses of aspirin every day.  Taking vitamin and mineral supplements as recommended by your health care provider. What happens during an annual well check? The services and screenings done by your health care provider during your annual well check will depend on your age, overall health, lifestyle risk factors, and family history of disease. Counseling  Your health care provider may ask you questions about your:  Alcohol use.  Tobacco use.  Drug use.  Emotional well-being.  Home and relationship well-being.  Sexual activity.  Eating habits.  History of falls.  Memory and ability to understand (cognition).  Work and work Statistician. Screening  You may have the following tests or measurements:  Height, weight, and BMI.  Blood pressure.  Lipid and cholesterol levels. These may be checked every 5 years, or more frequently if you are over 27 years old.  Skin check.  Lung cancer screening. You may have this screening every year starting at age 67 if you have a 30-pack-year history of smoking and currently smoke or have quit within the past 15 years.  Fecal occult blood test (FOBT) of the stool. You may have this test every year starting at age 27.  Flexible sigmoidoscopy or colonoscopy. You may have a sigmoidoscopy every 5 years or a colonoscopy every 10 years starting  at age 39.  Prostate cancer screening. Recommendations will vary depending on your family history and other risks.  Hepatitis C blood  test.  Hepatitis B blood test.  Sexually transmitted disease (STD) testing.  Diabetes screening. This is done by checking your blood sugar (glucose) after you have not eaten for a while (fasting). You may have this done every 1-3 years.  Abdominal aortic aneurysm (AAA) screening. You may need this if you are a current or former smoker.  Osteoporosis. You may be screened starting at age 53 if you are at high risk. Talk with your health care provider about your test results, treatment options, and if necessary, the need for more tests. Vaccines  Your health care provider may recommend certain vaccines, such as:  Influenza vaccine. This is recommended every year.  Tetanus, diphtheria, and acellular pertussis (Tdap, Td) vaccine. You may need a Td booster every 10 years.  Zoster vaccine. You may need this after age 75.  Pneumococcal 13-valent conjugate (PCV13) vaccine. One dose is recommended after age 10.  Pneumococcal polysaccharide (PPSV23) vaccine. One dose is recommended after age 37. Talk to your health care provider about which screenings and vaccines you need and how often you need them. This information is not intended to replace advice given to you by your health care provider. Make sure you discuss any questions you have with your health care provider. Document Released: 04/14/2015 Document Revised: 12/06/2015 Document Reviewed: 01/17/2015 Elsevier Interactive Patient Education  2017 [place] Prevention in the Home Falls can cause injuries. They can happen to people of all ages. There are many things you can do to make your home safe and to help prevent falls. What can I do on the outside of my home?  Regularly fix the edges of walkways and driveways and fix any cracks.  Remove anything that might make you trip as you walk through a door, such as a raised step or threshold.  Trim any bushes or trees on the path to your home.  Use bright outdoor  lighting.  Clear any walking paths of anything that might make someone trip, such as rocks or tools.  Regularly check to see if handrails are loose or broken. Make sure that both sides of any steps have handrails.  Any raised decks and porches should have guardrails on the edges.  Have any leaves, snow, or ice cleared regularly.  Use sand or salt on walking paths during winter.  Clean up any spills in your garage right away. This includes oil or grease spills. What can I do in the bathroom?  Use night lights.  Install grab bars by the toilet and in the tub and shower. Do not use towel bars as grab bars.  Use non-skid mats or decals in the tub or shower.  If you need to sit down in the shower, use a plastic, non-slip stool.  Keep the floor dry. Clean up any water that spills on the floor as soon as it happens.  Remove soap buildup in the tub or shower regularly.  Attach bath mats securely with double-sided non-slip rug tape.  Do not have throw rugs and other things on the floor that can make you trip. What can I do in the bedroom?  Use night lights.  Make sure that you have a light by your bed that is easy to reach.  Do not use any sheets or blankets that are too big for your bed. They should not hang down onto  the floor.  Have a firm chair that has side arms. You can use this for support while you get dressed.  Do not have throw rugs and other things on the floor that can make you trip. What can I do in the kitchen?  Clean up any spills right away.  Avoid walking on wet floors.  Keep items that you use a lot in easy-to-reach places.  If you need to reach something above you, use a strong step stool that has a grab bar.  Keep electrical cords out of the way.  Do not use floor polish or wax that makes floors slippery. If you must use wax, use non-skid floor wax.  Do not have throw rugs and other things on the floor that can make you trip. What can I do with my  stairs?  Do not leave any items on the stairs.  Make sure that there are handrails on both sides of the stairs and use them. Fix handrails that are broken or loose. Make sure that handrails are as long as the stairways.  Check any carpeting to make sure that it is firmly attached to the stairs. Fix any carpet that is loose or worn.  Avoid having throw rugs at the top or bottom of the stairs. If you do have throw rugs, attach them to the floor with carpet tape.  Make sure that you have a light switch at the top of the stairs and the bottom of the stairs. If you do not have them, ask someone to add them for you. What else can I do to help prevent falls?  Wear shoes that:  Do not have high heels.  Have rubber bottoms.  Are comfortable and fit you well.  Are closed at the toe. Do not wear sandals.  If you use a stepladder:  Make sure that it is fully opened. Do not climb a closed stepladder.  Make sure that both sides of the stepladder are locked into place.  Ask someone to hold it for you, if possible.  Clearly mark and make sure that you can see:  Any grab bars or handrails.  First and last steps.  Where the edge of each step is.  Use tools that help you move around (mobility aids) if they are needed. These include:  Canes.  Walkers.  Scooters.  Crutches.  Turn on the lights when you go into a dark area. Replace any light bulbs as soon as they burn out.  Set up your furniture so you have a clear path. Avoid moving your furniture around.  If any of your floors are uneven, fix them.  If there are any pets around you, be aware of where they are.  Review your medicines with your doctor. Some medicines can make you feel dizzy. This can increase your chance of falling. Ask your doctor what other things that you can do to help prevent falls. This information is not intended to replace advice given to you by your health care provider. Make sure you discuss any  questions you have with your health care provider. Document Released: 01/12/2009 Document Revised: 08/24/2015 Document Reviewed: 04/22/2014 Elsevier Interactive Patient Education  2017 [employer].

## 2019-12-22 NOTE — Assessment & Plan Note (Signed)
On Eliquis

## 2019-12-27 DIAGNOSIS — H43812 Vitreous degeneration, left eye: Secondary | ICD-10-CM | POA: Diagnosis not present

## 2019-12-30 DIAGNOSIS — Z961 Presence of intraocular lens: Secondary | ICD-10-CM | POA: Diagnosis not present

## 2019-12-30 DIAGNOSIS — H33031 Retinal detachment with giant retinal tear, right eye: Secondary | ICD-10-CM | POA: Diagnosis not present

## 2020-01-25 DIAGNOSIS — L570 Actinic keratosis: Secondary | ICD-10-CM | POA: Diagnosis not present

## 2020-02-13 ENCOUNTER — Other Ambulatory Visit: Payer: Self-pay

## 2020-02-13 ENCOUNTER — Emergency Department (HOSPITAL_BASED_OUTPATIENT_CLINIC_OR_DEPARTMENT_OTHER)
Admission: EM | Admit: 2020-02-13 | Discharge: 2020-02-13 | Disposition: A | Discharge status: Home / Self Care | Payer: Medicare Other | Attending: Emergency Medicine | Admitting: Emergency Medicine

## 2020-02-13 ENCOUNTER — Encounter (HOSPITAL_BASED_OUTPATIENT_CLINIC_OR_DEPARTMENT_OTHER): Payer: Self-pay | Admitting: Emergency Medicine

## 2020-02-13 ENCOUNTER — Emergency Department (HOSPITAL_BASED_OUTPATIENT_CLINIC_OR_DEPARTMENT_OTHER): Payer: Medicare Other

## 2020-02-13 DIAGNOSIS — E039 Hypothyroidism, unspecified: Secondary | ICD-10-CM | POA: Insufficient documentation

## 2020-02-13 DIAGNOSIS — Z7901 Long term (current) use of anticoagulants: Secondary | ICD-10-CM | POA: Insufficient documentation

## 2020-02-13 DIAGNOSIS — Z96652 Presence of left artificial knee joint: Secondary | ICD-10-CM | POA: Insufficient documentation

## 2020-02-13 DIAGNOSIS — L03031 Cellulitis of right toe: Secondary | ICD-10-CM | POA: Insufficient documentation

## 2020-02-13 DIAGNOSIS — Z79899 Other long term (current) drug therapy: Secondary | ICD-10-CM | POA: Diagnosis not present

## 2020-02-13 DIAGNOSIS — I2510 Atherosclerotic heart disease of native coronary artery without angina pectoris: Secondary | ICD-10-CM | POA: Diagnosis not present

## 2020-02-13 DIAGNOSIS — I5033 Acute on chronic diastolic (congestive) heart failure: Secondary | ICD-10-CM | POA: Insufficient documentation

## 2020-02-13 DIAGNOSIS — M7989 Other specified soft tissue disorders: Secondary | ICD-10-CM | POA: Diagnosis not present

## 2020-02-13 DIAGNOSIS — Z87891 Personal history of nicotine dependence: Secondary | ICD-10-CM | POA: Diagnosis not present

## 2020-02-13 DIAGNOSIS — M79674 Pain in right toe(s): Secondary | ICD-10-CM | POA: Diagnosis present

## 2020-02-13 DIAGNOSIS — M19071 Primary osteoarthritis, right ankle and foot: Secondary | ICD-10-CM | POA: Diagnosis not present

## 2020-02-13 DIAGNOSIS — Z7982 Long term (current) use of aspirin: Secondary | ICD-10-CM | POA: Insufficient documentation

## 2020-02-13 DIAGNOSIS — Z856 Personal history of leukemia: Secondary | ICD-10-CM | POA: Diagnosis not present

## 2020-02-13 DIAGNOSIS — L03115 Cellulitis of right lower limb: Secondary | ICD-10-CM | POA: Diagnosis not present

## 2020-02-13 DIAGNOSIS — I110 Hypertensive heart disease with heart failure: Secondary | ICD-10-CM | POA: Diagnosis not present

## 2020-02-13 DIAGNOSIS — I4891 Unspecified atrial fibrillation: Secondary | ICD-10-CM | POA: Insufficient documentation

## 2020-02-13 MED ORDER — CEPHALEXIN 500 MG PO CAPS: 500.0000 mg | ORAL_CAPSULE | Freq: Four times a day (QID) | ORAL | 0 refills | Status: AC

## 2020-02-13 NOTE — ED Triage Notes (Signed)
Pain and redness to toes on R foot x 3 days. Denies injury.

## 2020-02-13 NOTE — ED Provider Notes (Signed)
[facility] EMERGENCY DEPARTMENT Provider Note   CSN: [id number] Arrival date & time: 02/13/20  1001     History Chief Complaint  Patient presents with  . Foot Pain    [name] White is a 84 y.o. male.  84 year old male with history of diabetes, a flutter, on Xarelto presents with complaint of pain, swelling, redness in his right second toe, also reports pain in his right first MTP.  Denies injury to the foot, no formal diagnosis of neuropathy, does not see podiatry.  No other complaints or concerns today.        Past Medical History:  Diagnosis Date  . Anemia 06/04/2015  . Anxiety   . Atrial flutter ([place])    s/p ablation 07/17/08  . Cancer ([place])    leukemia - dx 05/2018  . Chronic diastolic CHF (congestive heart failure) ([place])   . Coronary artery disease   . Depression   . Diabetes mellitus type 2 in obese [facility]) 04/10/2009   Qualifier: Diagnosis of  By: Arnoldo Morale MD, Balinda Quails, diet controlled, lost 60lbs  . Emphysema of lung ([place])   . GERD (gastroesophageal reflux disease)   . Glaucoma    recent dx - has an appt to be evaluated and meds to treat  . H/O hiatal hernia   . Hearing loss    some hearing loss in left ear but has not been dx, no hearing aids  . Hepatitis    h/o of type A  . History of measles as a child   . History of mumps as a child   . Hypertension   . Hypothyroidism   . Micturition syncope   . Osteoarthritis   . Pleural effusion, bilateral   . 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 Severe aortic stenosis   . Sleep apnea    uses CPAP nightly  . Vitamin D deficiency 11/24/2015    Patient Active Problem List   Diagnosis Date Noted  . 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 Retinal detachment 06/17/2019  . 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 Scrotal swelling 04/20/2019  . S/P TAVR (transcatheter  aortic valve replacement) 04/13/2019  . Severe aortic stenosis   . Pleural effusion, bilateral   . Acute on chronic diastolic heart failure ([place])   . Side effect of medication 05/19/2018  . Vitamin D deficiency 11/24/2015  . Anemia 06/04/2015  . Measles   . Allergic rhinitis 07/14/2013  . Biceps rupture, distal 09/16/2012  . Coronary atherosclerosis of native coronary artery 08/31/2011  . Chronic atrial fibrillation ([place]) 06/25/2011  . Diabetes mellitus type 2 in obese ([place]) 04/10/2009  . Bradycardia 03/13/2009  . Obstructive sleep apnea 08/29/2008  . Hypothyroidism 09/23/2006  . Hyperlipidemia 09/23/2006  . Depression with anxiety 09/23/2006  . Essential hypertension 09/23/2006  . GERD 09/23/2006    Past Surgical History:  Procedure Laterality Date  . CARDIAC CATHETERIZATION  03/18/2019  . CARDIAC ELECTROPHYSIOLOGY MAPPING AND ABLATION  06/2008  . CATARACT EXTRACTION W/ INTRAOCULAR LENS  IMPLANT, BILATERAL  ~ 2007  . COLONOSCOPY    . FOOT TENDON SURGERY Left   . INTRAOPERATIVE TRANSTHORACIC ECHOCARDIOGRAM N/A 04/13/2019   Procedure: TRANSTHORACIC ECHOCARDIOGRAM;  Surgeon: Sherren Mocha, MD;  Location: [facility];  Service: Open Heart Surgery;  Laterality: N/A;  . RIGHT/LEFT HEART CATH AND CORONARY ANGIOGRAPHY N/A 03/18/2019   Procedure: RIGHT/LEFT HEART CATH AND CORONARY  ANGIOGRAPHY;  Surgeon: Jolaine Artist, MD;  Location: [facility];  Service: Cardiovascular;  Laterality: N/A;  . TONSILLECTOMY  1940's  . TOTAL KNEE ARTHROPLASTY  1990's   right  . TOTAL KNEE ARTHROPLASTY Left 08/02/2013   Procedure: LEFT TOTAL KNEE ARTHROPLASTY;  Surgeon: Gearlean Alf, MD;  Location: [facility];  Service: Orthopedics;  Laterality: Left;  . TRANSCATHETER AORTIC VALVE REPLACEMENT, TRANSFEMORAL N/A 04/13/2019   Procedure: TRANSCATHETER AORTIC VALVE REPLACEMENT, TRANSFEMORAL;  Surgeon: Sherren Mocha, MD;  Location: [facility];  Service: Open Heart Surgery;  Laterality: N/A;  . UPPER GI  ENDOSCOPY    . WISDOM TOOTH EXTRACTION         Family History  Problem Relation Age of Onset  . Multiple sclerosis Father   . Heart disease Mother        atrial fibrillation  . Hypertension Son   . Heart disease Maternal Grandfather     Social History   Tobacco Use  . Smoking status: Former Smoker    Years: 0.00    Types: Pipe, Cigars    Quit date: 04/01/1996    Years since quitting: 23.8  . Smokeless tobacco: Former Systems developer    Types: Chew    Quit date: 04/01/2009  Vaping Use  . Vaping Use: Never used  Substance Use Topics  . Alcohol use: Not Currently    Alcohol/week: 0.0 standard drinks    Comment: Quit wine early 2020.  [name] Drug use: No    Home Medications Prior to Admission medications   Medication Sig Start Date End Date Taking? Authorizing Provider  amoxicillin (AMOXIL) 500 MG tablet Take 4 capsules (2,000 mg) one hour prior to all dental visits. 04/21/19   Eileen Stanford, PA-C  apixaban (ELIQUIS) 5 MG TABS tablet Take 1 tablet (5 mg total) by mouth 2 (two) times daily. 12/08/19   Bensimhon, Shaune Pascal, MD  APPLE CIDER VINEGAR PO Take 1 capsule by mouth daily.    [provider]  aspirin 81 MG chewable tablet Chew 1 tablet (81 mg total) by mouth daily. 04/15/19   Eileen Stanford, PA-C  cephALEXin (KEFLEX) 500 MG capsule Take 1 capsule (500 mg total) by mouth 4 (four) times daily for 10 days. 02/13/20 02/23/20  Tacy Learn, PA-C  cetirizine (ZYRTEC) 10 MG tablet Take 1 tablet (10 mg total) by mouth daily. 12/22/19   Plotnikov, Evie Lacks, MD  CINNAMON PO Take 2,000 mg by mouth 2 (two) times daily.     [provider]  cloNIDine (CATAPRES) 0.1 MG tablet TAKE 1 TABLET AS NEEDED FOR SYSTOLIC BLOOD PRESSURE GREATER THAN 160 09/01/19   Bensimhon, Shaune Pascal, MD  Coenzyme Q10 ([employer] COQ-10) 200 MG capsule Take 200 mg by mouth every evening.     [provider]  dasatinib (SPRYCEL) 50 MG tablet Take 50 mg by mouth daily.    [provider]   ergocalciferol (VITAMIN D2) 1.25 MG (50000 UT) capsule Take 50,000 Units by mouth every Monday.    [provider]  esomeprazole (NEXIUM) 20 MG capsule Take 20 mg by mouth daily at 12 noon.    [provider]  fenofibrate (TRICOR) 145 MG tablet TAKE 1 TABLET BY MOUTH EVERY DAY 12/09/18   Bensimhon, Shaune Pascal, MD  ferrous sulfate 325 (65 FE) MG tablet Take 325 mg by mouth every evening.     [provider]  finasteride (PROSCAR) 5 MG tablet Take 5 mg by mouth every evening.  [provider]  fluticasone (FLONASE) 50 MCG/ACT nasal spray Place 2 sprays into both nostrils daily. 09/20/19   Plotnikov, Evie Lacks, MD  latanoprost (XALATAN) 0.005 % ophthalmic solution 1 drop at bedtime. 04/19/19   [provider]  levothyroxine (SYNTHROID) 200 MCG tablet Take 200 mcg by mouth daily before breakfast.    [provider]  PARoxetine (PAXIL) 40 MG tablet Take 1 tablet (40 mg total) by mouth every morning. 09/14/13   Ricard Dillon, MD  potassium chloride SA (KLOR-CON M20) 20 MEQ tablet Take 1 tablet (20 mEq total) by mouth daily. 10/27/19   Bensimhon, Shaune Pascal, MD  rosuvastatin (CRESTOR) 10 MG tablet TAKE 1 TABLET BY MOUTH EVERY DAY 09/24/19   Bensimhon, Shaune Pascal, MD  spironolactone (ALDACTONE) 25 MG tablet Take 0.5 tablets (12.5 mg total) by mouth daily. 07/21/17   Bensimhon, Shaune Pascal, MD  torsemide (DEMADEX) 20 MG tablet TAKE 1 TABLET (20 MG TOTAL) BY MOUTH DAILY. MAY TAKE AND ADDITIONAL TAB IN THE EVENING 10/05/19   Bensimhon, Shaune Pascal, MD  traZODone (DESYREL) 50 MG tablet Take 50 mg by mouth at bedtime.    [provider]  TURMERIC PO Take 1,000 mg by mouth every evening.     [provider]    Allergies    Ace inhibitors, Benazepril, Codeine, Statins, Piroxicam, Pseudoephedrine, Dasatinib, and Betadine [povidone iodine]  Review of Systems   Review of Systems  Constitutional: Negative for fever.  Musculoskeletal: Positive for  arthralgias.  Skin: Positive for color change. Negative for wound.  Allergic/Immunologic: Positive for immunocompromised state.  Neurological: Negative for weakness.    Physical Exam Updated Vital Signs BP (!) 188/94 (BP Location: Left Arm)   Pulse 88   Temp 98.5 F (36.9 C) (Oral)   Resp 18   Ht 6\' 2"  (1.88 m)   Wt 108.4 kg   SpO2 100%   BMI 30.69 kg/m   Physical Exam Vitals and nursing note reviewed.  Constitutional:      General: He is not in acute distress.    Appearance: He is well-developed. He is not diaphoretic.  HENT:     Head: Normocephalic and atraumatic.  Cardiovascular:     Pulses: Normal pulses.  Pulmonary:     Effort: Pulmonary effort is normal.  Musculoskeletal:        General: Swelling and tenderness present. No deformity.       Feet:  Skin:    General: Skin is warm and dry.     Findings: Erythema present.  Neurological:     Mental Status: He is alert and oriented to person, place, and time.     Sensory: No sensory deficit.     Motor: No weakness.  Psychiatric:        Behavior: Behavior normal.     ED Results / Procedures / Treatments   Labs (all labs ordered are listed, but only abnormal results are displayed) Labs Reviewed - No data to display  EKG None  Radiology DG Foot Complete Right  Result Date: 02/13/2020 CLINICAL DATA:  Foot pain for 3 days. Redness and swelling of the second toe. EXAM: RIGHT FOOT COMPLETE - 3+ VIEW COMPARISON:  None. FINDINGS: Osteopenia. No acute fracture or dislocation. Mild degenerative changes of the first MTP. Scattered degenerative changes throughout the DIPs and PIPs. No area of erosion or osseous destruction. No unexpected radiopaque foreign body. Vascular calcifications. IMPRESSION: No radiographic evidence of osteomyelitis. Electronically Signed   By: Valentino Saxon MD  On: 02/13/2020 12:04    Procedures Procedures (including critical care time)  Medications Ordered in ED Medications - No data  to display  ED Course  I have reviewed the triage vital signs and the nursing notes.  Pertinent labs & imaging results that were available during my care of the patient were reviewed by me and considered in my medical decision making (see chart for details).  Clinical Course as of Feb 12 1242  Nancy Fetter [birth year]  [id number] 84 year old male with history of diabetes with right foot pain today.  Found to have swelling, tenderness, redness of the right second toe.  X-ray is negative for osteomyelitis.  Discussed with patient and family at bedside importance of follow-up with podiatry for routine foot care as well as recheck on this toe with concern for infection.  Will start with Keflex and warm compresses, advised to dry toes well in between each soak.  Discussed with Dr. Maretta Bees, ER attending who has seen the patient and agrees with plan of care.   [LM]    Clinical Course User Index [LM] Roque Lias   MDM Rules/Calculators/A&P                          Final Clinical Impression(s) / ED Diagnoses Final diagnoses:  Cellulitis of toe of right foot    Rx / DC Orders ED Discharge Orders         Ordered    cephALEXin (KEFLEX) 500 MG capsule  4 times daily        02/13/20 1241           Tacy Learn, PA-C 02/13/20 1244    Wyvonnia Dusky, MD 02/13/20 1732

## 2020-02-13 NOTE — Discharge Instructions (Signed)
Take Keflex as prescribed. Warm compresses to foot for 20 minutes at a time, be sure to dry between toes after soaks.  Follow up with podiatry for recheck in 2 days.

## 2020-02-14 ENCOUNTER — Other Ambulatory Visit: Payer: Self-pay | Admitting: Physician Assistant

## 2020-02-14 DIAGNOSIS — Z952 Presence of prosthetic heart valve: Secondary | ICD-10-CM

## 2020-02-23 ENCOUNTER — Other Ambulatory Visit: Payer: Self-pay

## 2020-02-23 ENCOUNTER — Ambulatory Visit (INDEPENDENT_AMBULATORY_CARE_PROVIDER_SITE_OTHER): Payer: Medicare Other | Admitting: Internal Medicine

## 2020-02-23 ENCOUNTER — Encounter: Payer: Self-pay | Admitting: Internal Medicine

## 2020-02-23 DIAGNOSIS — L03119 Cellulitis of unspecified part of limb: Secondary | ICD-10-CM | POA: Diagnosis not present

## 2020-02-23 DIAGNOSIS — E1169 Type 2 diabetes mellitus with other specified complication: Secondary | ICD-10-CM | POA: Diagnosis not present

## 2020-02-23 DIAGNOSIS — E669 Obesity, unspecified: Secondary | ICD-10-CM

## 2020-02-23 NOTE — Assessment & Plan Note (Signed)
Wt Readings from Last 3 Encounters:  02/23/20 232 lb 12.8 oz (105.6 kg)  02/13/20 239 lb (108.4 kg)  12/22/19 232 lb (105.2 kg)

## 2020-02-23 NOTE — Assessment & Plan Note (Signed)
R 2nd toe - Better. Finish Keflex

## 2020-02-23 NOTE — Progress Notes (Signed)
Subjective:  Patient ID: [name] White, male    DOB: 10/31/1935  Age: 84 y.o. MRN: [id number]  CC: Cellulitis ((R) big toe)   HPI [name] White presents for R 2nd toe cellulitis on 01/1420 treated w/Keflex x 10 d. No pain  Outpatient Medications Prior to Visit  Medication Sig Dispense Refill  . amoxicillin (AMOXIL) 500 MG tablet Take 4 capsules (2,000 mg) one hour prior to all dental visits. 8 tablet 11  . apixaban (ELIQUIS) 5 MG TABS tablet Take 1 tablet (5 mg total) by mouth 2 (two) times daily. 180 tablet 3  . APPLE CIDER VINEGAR PO Take 1 capsule by mouth daily.    [name] aspirin 81 MG chewable tablet Chew 1 tablet (81 mg total) by mouth daily.    . cephALEXin (KEFLEX) 500 MG capsule Take 1 capsule (500 mg total) by mouth 4 (four) times daily for 10 days. 40 capsule 0  . cetirizine (ZYRTEC) 10 MG tablet Take 1 tablet (10 mg total) by mouth daily. 90 tablet 3  . CINNAMON PO Take 2,000 mg by mouth 2 (two) times daily.     . cloNIDine (CATAPRES) 0.1 MG tablet TAKE 1 TABLET AS NEEDED FOR SYSTOLIC BLOOD PRESSURE GREATER THAN 160 90 tablet 1  . Coenzyme Q10 ([employer] COQ-10) 200 MG capsule Take 200 mg by mouth every evening.     . ergocalciferol (VITAMIN D2) 1.25 MG (50000 UT) capsule Take 50,000 Units by mouth every Monday.    . esomeprazole (NEXIUM) 20 MG capsule Take 20 mg by mouth daily at 12 noon.    . fenofibrate (TRICOR) 145 MG tablet TAKE 1 TABLET BY MOUTH EVERY DAY 90 tablet 1  . ferrous sulfate 325 (65 FE) MG tablet Take 325 mg by mouth every evening.     . finasteride (PROSCAR) 5 MG tablet Take 5 mg by mouth every evening.     . fluticasone (FLONASE) 50 MCG/ACT nasal spray Place 2 sprays into both nostrils daily. 16 g 6  . latanoprost (XALATAN) 0.005 % ophthalmic solution 1 drop at bedtime.    [name] levothyroxine (SYNTHROID) 200 MCG tablet Take 200 mcg by mouth daily before breakfast.    . nilotinib (TASIGNA) 150 MG capsule Take 300 mg by mouth 2 (two) times daily. Take on an empty  stomach, 1 hr before or 2 hrs after food.    [name] PARoxetine (PAXIL) 40 MG tablet Take 1 tablet (40 mg total) by mouth every morning. 90 tablet 1  . potassium chloride SA (KLOR-CON M20) 20 MEQ tablet Take 1 tablet (20 mEq total) by mouth daily. 90 tablet 1  . rosuvastatin (CRESTOR) 10 MG tablet TAKE 1 TABLET BY MOUTH EVERY DAY 90 tablet 3  . spironolactone (ALDACTONE) 25 MG tablet Take 0.5 tablets (12.5 mg total) by mouth daily. 45 tablet 3  . torsemide (DEMADEX) 20 MG tablet TAKE 1 TABLET (20 MG TOTAL) BY MOUTH DAILY. MAY TAKE AND ADDITIONAL TAB IN THE EVENING 220 tablet 3  . traZODone (DESYREL) 50 MG tablet Take 50 mg by mouth at bedtime.    . TURMERIC PO Take 1,000 mg by mouth every evening.     . dasatinib (SPRYCEL) 50 MG tablet Take 50 mg by mouth daily. (Patient not taking: Reported on 02/23/2020)     No facility-administered medications prior to visit.    ROS: Review of Systems  Constitutional: Negative for chills and fever.  Musculoskeletal: Positive for gait problem.  Skin: Positive for color change.  Hematological: Bruises/bleeds easily.  Psychiatric/Behavioral: The patient is not nervous/anxious.     Objective:  BP 130/82 (BP Location: Left Arm)   Pulse 79   Temp 98.2 F (36.8 C) (Oral)   Wt 232 lb 12.8 oz (105.6 kg)   SpO2 95%   BMI 29.89 kg/m   BP Readings from Last 3 Encounters:  02/23/20 130/82  02/13/20 (!) 172/95  12/22/19 130/86    Wt Readings from Last 3 Encounters:  02/23/20 232 lb 12.8 oz (105.6 kg)  02/13/20 239 lb (108.4 kg)  12/22/19 232 lb (105.2 kg)    Physical Exam Constitutional:      General: He is not in acute distress.    Appearance: He is well-developed. He is not ill-appearing.     Comments: NAD  Eyes:     Conjunctiva/sclera: Conjunctivae normal.     Pupils: Pupils are equal, round, and reactive to light.  Neck:     Thyroid: No thyromegaly.     Vascular: No JVD.  Cardiovascular:     Heart sounds: Normal heart sounds. No murmur  heard.  No friction rub. No gallop.   Pulmonary:     Effort: No respiratory distress.     Breath sounds: No wheezing or rales.  Chest:     Chest wall: No tenderness.  Abdominal:     General: Bowel sounds are normal. There is no distension.     Palpations: Abdomen is soft. There is no mass.     Tenderness: There is no abdominal tenderness. There is no guarding or rebound.  Musculoskeletal:        General: No tenderness. Normal range of motion.  Lymphadenopathy:     Cervical: No cervical adenopathy.  Skin:    General: Skin is warm and dry.     Findings: No rash.  Neurological:     Mental Status: He is alert and oriented to person, place, and time.     Cranial Nerves: No cranial nerve deficit.     Motor: No abnormal muscle tone.     Coordination: Coordination abnormal.     Gait: Gait abnormal.     Deep Tendon Reflexes: Reflexes are normal and symmetric.  Psychiatric:        Behavior: Behavior normal.        Thought Content: Thought content normal.        Judgment: Judgment normal.    R 2nd toe w/residual redness, NT    Lab Results  Component Value Date   WBC 5.3 08/16/2019   HGB 13.5 08/16/2019   HCT 43.9 08/16/2019   PLT 169 08/16/2019   GLUCOSE 129 (H) 08/16/2019   CHOL 171 01/22/2018   TRIG 98 01/22/2018   HDL 57 01/22/2018   LDLDIRECT 141.6 11/12/2010   LDLCALC 94 01/22/2018   ALT 22 07/14/2019   AST 40 (H) 07/14/2019   NA 138 08/16/2019   K 4.2 08/16/2019   CL 102 08/16/2019   CREATININE 1.06 08/16/2019   BUN 29 (H) 08/16/2019   CO2 26 08/16/2019   TSH 1.25 07/14/2019   PSA 1.42 09/13/2013   INR 1.1 04/09/2019   HGBA1C 6.3 07/14/2019   MICROALBUR 6.0 (H) 01/10/2017    DG Foot Complete Right  Result Date: 02/13/2020 CLINICAL DATA:  Foot pain for 3 days. Redness and swelling of the second toe. EXAM: RIGHT FOOT COMPLETE - 3+ VIEW COMPARISON:  None. FINDINGS: Osteopenia. No acute fracture or dislocation. Mild degenerative changes of the first MTP.  Scattered degenerative changes throughout  the DIPs and PIPs. No area of erosion or osseous destruction. No unexpected radiopaque foreign body. Vascular calcifications. IMPRESSION: No radiographic evidence of osteomyelitis. Electronically Signed   By: Valentino Saxon MD   On: 02/13/2020 12:04    Assessment & Plan:   There are no diagnoses linked to this encounter.   No orders of the defined types were placed in this encounter.    Follow-up: No follow-ups on file.  Walker Kehr, MD

## 2020-03-30 DIAGNOSIS — H43812 Vitreous degeneration, left eye: Secondary | ICD-10-CM | POA: Diagnosis not present

## 2020-03-30 DIAGNOSIS — H35351 Cystoid macular degeneration, right eye: Secondary | ICD-10-CM | POA: Diagnosis not present

## 2020-03-30 DIAGNOSIS — H31091 Other chorioretinal scars, right eye: Secondary | ICD-10-CM | POA: Diagnosis not present

## 2020-04-06 DIAGNOSIS — L570 Actinic keratosis: Secondary | ICD-10-CM | POA: Diagnosis not present

## 2020-04-12 ENCOUNTER — Ambulatory Visit: Payer: Medicare Other | Admitting: Physician Assistant

## 2020-04-12 ENCOUNTER — Other Ambulatory Visit: Payer: Self-pay

## 2020-04-12 ENCOUNTER — Ambulatory Visit (HOSPITAL_COMMUNITY): Payer: Medicare Other | Attending: Cardiology

## 2020-04-12 VITALS — BP 126/84 | HR 73 | Wt 233.8 lb

## 2020-04-12 DIAGNOSIS — C9110 Chronic lymphocytic leukemia of B-cell type not having achieved remission: Secondary | ICD-10-CM | POA: Diagnosis not present

## 2020-04-12 DIAGNOSIS — Z952 Presence of prosthetic heart valve: Secondary | ICD-10-CM

## 2020-04-12 DIAGNOSIS — I482 Chronic atrial fibrillation, unspecified: Secondary | ICD-10-CM | POA: Diagnosis not present

## 2020-04-12 DIAGNOSIS — I4819 Other persistent atrial fibrillation: Secondary | ICD-10-CM

## 2020-04-12 DIAGNOSIS — I10 Essential (primary) hypertension: Secondary | ICD-10-CM | POA: Diagnosis not present

## 2020-04-12 LAB — ECHOCARDIOGRAM COMPLETE
AR max vel: 1.62 cm2
AV Area VTI: 1.72 cm2
AV Area mean vel: 1.48 cm2
AV Mean grad: 8 mmHg
AV Peak grad: 13.2 mmHg
Ao pk vel: 1.82 m/s
Area-P 1/2: 2.97 cm2
S' Lateral: 2.7 cm

## 2020-04-12 NOTE — Patient Instructions (Signed)
Medication Instructions:  No changes *If you need a refill on your cardiac medications before your next appointment, please call your pharmacy*   Lab Work: none If you have labs (blood work) drawn today and your tests are completely normal, you will receive your results only by: [name] MyChart Message (if you have MyChart) OR . A paper copy in the mail If you have any lab test that is abnormal or we need to change your treatment, we will call you to review the results.   Testing/Procedures: none   Follow-Up: At [facility], you and your health needs are our priority.  As part of our continuing mission to provide you with exceptional heart care, we have created designated Provider Care Teams.  These Care Teams include your primary Cardiologist (physician) and Advanced Practice Providers (APPs -  Physician Assistants and Nurse Practitioners) who all work together to provide you with the care you need, when you need it.   Your next appointment:   6 month(s)  The format for your next appointment:   In Person  Provider:   Dr. Haroldine Laws   Other Instructions

## 2020-04-12 NOTE — Progress Notes (Signed)
[facility] [place]                                       Cardiology Office Note    Date:  04/13/2020   ID:  [name] White, DOB 08-03-1935, MRN [id number]  PCP:  [name] Anger, MD  Cardiologist: Dr. Haroldine White / Dr. Burt White & Dr. Roxy White (TAVR)  CC: 1 year s/p TAVR   History of Present Illness:  [name] White is a 85 y.o. 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  Patient states that he has known presence of a heart murmur for many years. His cardiac history dates back more than 10 years ago when he was first found to have atrial flutter. He underwent atrial flutter ablation by Dr. Lovena White and initially did well but subsequently developed atrial fibrillation. He was treated for Tikosyn for a period of time but developed recurrent atrial fibrillation which has remained persistent. He has been followed for many years by Dr. Mauricia White previous echocardiograms have documented the presence of normal left ventricular systolic function and aortic stenosis which gradually progressed in severity. Patient remained remarkably active until approximately 2 years ago when he was initially diagnosed with diabetes and subsequently found to have CML. He has been treated with chemotherapy. Last year, he developed gradual worsening of exertional shortness of breath.Echocardiogram performed January 20, 2019 revealed severe aortic stenosis with preserved left ventricular systolic function. Peak velocity across aortic valve was reported close to 4.0 m/s with mean transvalvular gradient estimated 41.5 mmHg. The DVI was notably quite low at 0.20.He then developed acute CHF requiring  diuretic usage. Diagnostic cardiac catheterization was performed March 18, 2019 and notable for mild nonobstructive coronary artery disease. Of note, he was found to have pleural effusions during work up. Thoracentesis was offered at the time of TAVR but he declined.   He was evaluated by the multidisciplinary valve team and underwent successful TAVR with a 26 mm Edwards Sapien 3 Ultra THV via the TF approach on 04/13/19. Post operative echo showed EF 60-65%, normally functioning TAVR with a mean gradient of 12.48mm hg and no PVL. There was a small circumferential pericardial effusion. Post op CXR showed improvement in pleural effusions and resolution on CXR 1 month post op. He was resumed on home Eliquis with the addition of a baby aspirin at discharge.   Today he presents to clinic for follow up. Had a retinal detachment that has required several surgeries. Vision is not perfect but it is good enough to drive, watch TV and use computer but cannot shoot his rifle anymore, which upsets him. No CP or SOB. No White edema, orthopnea or PND. No dizziness or syncope. No blood in stool or urine. No palpitations. Gets routine labs through the [place].   Past Medical History:  Diagnosis Date  . Anemia 06/04/2015  . Anxiety   . Atrial flutter ([place])    s/p ablation 07/17/08  . Cancer ([place])    leukemia - dx 05/2018  . Chronic diastolic CHF (congestive heart failure) ([place])   . Coronary artery disease   . Depression   .  Diabetes mellitus type 2 in obese [facility]) 04/10/2009   Qualifier: Diagnosis of  By: Arnoldo Morale MD, Balinda Quails, diet controlled, lost 60lbs  . Emphysema of lung ([place])   . GERD (gastroesophageal reflux disease)   . Glaucoma    recent dx - has an appt to be evaluated and meds to treat  . H/O hiatal hernia   . Hearing loss    some hearing loss in left ear but has not been dx, no hearing aids  . Hepatitis    h/o of type A  . History of measles as a child   . History of mumps as a child   . Hypertension   .  Hypothyroidism   . Micturition syncope   . Osteoarthritis   . Pleural effusion, bilateral   . 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 Severe aortic stenosis   . Sleep apnea    uses CPAP nightly  . Vitamin D deficiency 11/24/2015    Past Surgical History:  Procedure Laterality Date  . CARDIAC CATHETERIZATION  03/18/2019  . CARDIAC ELECTROPHYSIOLOGY MAPPING AND ABLATION  06/2008  . CATARACT EXTRACTION W/ INTRAOCULAR LENS  IMPLANT, BILATERAL  ~ 2007  . COLONOSCOPY    . FOOT TENDON SURGERY Left   . INTRAOPERATIVE TRANSTHORACIC ECHOCARDIOGRAM N/A 04/13/2019   Procedure: TRANSTHORACIC ECHOCARDIOGRAM;  Surgeon: Sherren Mocha, MD;  Location: [facility];  Service: Open Heart Surgery;  Laterality: N/A;  . RIGHT/LEFT HEART CATH AND CORONARY ANGIOGRAPHY N/A 03/18/2019   Procedure: RIGHT/LEFT HEART CATH AND CORONARY ANGIOGRAPHY;  Surgeon: Jolaine Artist, MD;  Location: [facility];  Service: Cardiovascular;  Laterality: N/A;  . TONSILLECTOMY  1940's  . TOTAL KNEE ARTHROPLASTY  1990's   right  . TOTAL KNEE ARTHROPLASTY Left 08/02/2013   Procedure: LEFT TOTAL KNEE ARTHROPLASTY;  Surgeon: Gearlean Alf, MD;  Location: [facility];  Service: Orthopedics;  Laterality: Left;  . TRANSCATHETER AORTIC VALVE REPLACEMENT, TRANSFEMORAL N/A 04/13/2019   Procedure: TRANSCATHETER AORTIC VALVE REPLACEMENT, TRANSFEMORAL;  Surgeon: Sherren Mocha, MD;  Location: [facility];  Service: Open Heart Surgery;  Laterality: N/A;  . UPPER GI ENDOSCOPY    . WISDOM TOOTH EXTRACTION      Current Medications: Outpatient Medications Prior to Visit  Medication Sig Dispense Refill  . amoxicillin (AMOXIL) 500 MG tablet Take 4 capsules (2,000 mg) one hour prior to all dental visits. 8 tablet 11  . apixaban (ELIQUIS) 5 MG TABS tablet Take 1 tablet (5 mg total) by mouth 2 (two) times daily. 180 tablet 3  .  APPLE CIDER VINEGAR PO Take 1 capsule by mouth daily.    . cetirizine (ZYRTEC) 10 MG tablet Take 1 tablet (10 mg total) by mouth daily. 90 tablet 3  . CINNAMON PO Take 2,000 mg by mouth 2 (two) times daily.     . cloNIDine (CATAPRES) 0.1 MG tablet TAKE 1 TABLET AS NEEDED FOR SYSTOLIC BLOOD PRESSURE GREATER THAN 160 90 tablet 1  . Coenzyme Q10 ([employer] COQ-10) 200 MG capsule Take 200 mg by mouth every evening.     . ergocalciferol (VITAMIN D2) 1.25 MG (50000 UT) capsule Take 50,000 Units by mouth every Monday.    . famotidine (PEPCID) 20 MG tablet Take 20 mg by mouth 2 (two) times daily.    . ferrous sulfate 325 (65 FE) MG tablet Take 325 mg by mouth every evening.     [name]  finasteride (PROSCAR) 5 MG tablet Take 5 mg by mouth every evening.    . latanoprost (XALATAN) 0.005 % ophthalmic solution 1 drop at bedtime.    [name] levothyroxine (SYNTHROID) 200 MCG tablet Take 200 mcg by mouth daily before breakfast.    . nilotinib (TASIGNA) 150 MG capsule Take 300 mg by mouth 2 (two) times daily. Take on an empty stomach, 1 hr before or 2 hrs after food.    [name] PARoxetine (PAXIL) 40 MG tablet Take 1 tablet (40 mg total) by mouth every morning. 90 tablet 1  . potassium chloride SA (KLOR-CON M20) 20 MEQ tablet Take 1 tablet (20 mEq total) by mouth daily. 90 tablet 1  . rosuvastatin (CRESTOR) 10 MG tablet TAKE 1 TABLET BY MOUTH EVERY DAY 90 tablet 3  . spironolactone (ALDACTONE) 25 MG tablet Take 0.5 tablets (12.5 mg total) by mouth daily. (Patient taking differently: Take 25 mg by mouth daily.) 45 tablet 3  . torsemide (DEMADEX) 20 MG tablet TAKE 1 TABLET (20 MG TOTAL) BY MOUTH DAILY. MAY TAKE AND ADDITIONAL TAB IN THE EVENING 220 tablet 3  . traZODone (DESYREL) 50 MG tablet Take 50 mg by mouth at bedtime.    . TURMERIC PO Take 1,000 mg by mouth every evening.     . fenofibrate (TRICOR) 145 MG tablet TAKE 1 TABLET BY MOUTH EVERY DAY (Patient not taking: Reported on 04/12/2020) 90 tablet 1  . fluticasone (FLONASE) 50  MCG/ACT nasal spray Place 2 sprays into both nostrils daily. (Patient not taking: Reported on 04/12/2020) 16 g 6  . aspirin 81 MG chewable tablet Chew 1 tablet (81 mg total) by mouth daily. (Patient not taking: Reported on 04/12/2020)    . esomeprazole (NEXIUM) 20 MG capsule Take 20 mg by mouth daily at 12 noon. (Patient not taking: Reported on 04/12/2020)     No facility-administered medications prior to visit.     Allergies:   Ace inhibitors, Benazepril, Codeine, Statins, Piroxicam, Pseudoephedrine, Dasatinib, and Betadine [povidone iodine]   Social History   Socioeconomic History  . Marital status: Married    Spouse name: Not on file  . Number of children: Not on file  . Years of education: Not on file  . Highest education level: Not on file  Occupational History  . Not on file  Tobacco Use  . Smoking status: Former Smoker    Years: 0.00    Types: Pipe, Cigars    Quit date: 04/01/1996    Years since quitting: 24.0  . Smokeless tobacco: Former Systems developer    Types: Chew    Quit date: 04/01/2009  Vaping Use  . Vaping Use: Never used  Substance and Sexual Activity  . Alcohol use: Not Currently    Alcohol/week: 0.0 standard drinks    Comment: Quit wine early 2020.  [name] Drug use: No  . Sexual activity: Not Currently    Comment: lives with wife, eats clean diet. No major dietary restrtictions,   Other Topics Concern  . Not on file  Social History Narrative  . Not on file   Social Determinants of Health   Financial Resource Strain: Low Risk   . Difficulty of Paying Living Expenses: Not hard at all  Food Insecurity: No Food Insecurity  . Worried About Charity fundraiser in the Last Year: Never true  . Ran Out of Food in the Last Year: Never true  Transportation Needs: No Transportation Needs  . Lack of Transportation (Medical): No  . Lack of Transportation (  Non-Medical): No  Physical Activity: Sufficiently Active  . Days of Exercise per Week: 5 days  . Minutes of Exercise per  Session: 30 min  Stress: No Stress Concern Present  . Feeling of Stress : Not at all  Social Connections: Socially Integrated  . Frequency of Communication with Friends and Family: More than three times a week  . Frequency of Social Gatherings with Friends and Family: More than three times a week  . Attends Religious Services: More than 4 times per year  . Active Member of Clubs or Organizations: Yes  . Attends Archivist Meetings: More than 4 times per year  . Marital Status: Married     Family History:  The patient's family history includes Heart disease in his maternal grandfather and mother; Hypertension in his son; Multiple sclerosis in his father.     ROS:   Please see the history of present illness.    ROS All other systems reviewed and are negative.   PHYSICAL EXAM:   VS:  BP 126/84   Pulse 73   Wt 233 lb 12.8 oz (106.1 kg)   SpO2 94%   BMI 30.02 kg/m    GEN: Well nourished, well developed, in no acute distress HEENT: normal Neck: no JVD or masses Cardiac: RRR; no murmurs, rubs, or gallops,no edema  Respiratory:  clear to auscultation bilaterally, normal work of breathing GI: soft, nontender, nondistended, + BS MS: no deformity or atrophy Skin: warm and dry, no rash.   Neuro:  Alert and Oriented x 3, Strength and sensation are intact Psych: euthymic mood, full affect   Wt Readings from Last 3 Encounters:  04/12/20 233 lb 12.8 oz (106.1 kg)  02/23/20 232 lb 12.8 oz (105.6 kg)  02/13/20 239 lb (108.4 kg)      Studies/Labs Reviewed:   EKG:  EKG is ordered today.  The ekg ordered today demonstrates atrial fib with HR 78, QTc 414 ms.   Recent Labs: 07/14/2019: ALT 22; TSH 1.25 08/16/2019: B Natriuretic Peptide 105.3; BUN 29; Creatinine, Ser 1.06; Hemoglobin 13.5; Platelets 169; Potassium 4.2; Sodium 138   Lipid Panel    Component Value Date/Time   CHOL 171 01/22/2018 1003   TRIG 98 01/22/2018 1003   TRIG 55 02/24/2006 0946   HDL 57 01/22/2018 1003    CHOLHDL 3.0 01/22/2018 1003   VLDL 20 01/22/2018 1003   LDLCALC 94 01/22/2018 1003   LDLDIRECT 141.6 11/12/2010 1205    Additional studies/ records that were reviewed today include:  TAVR OPERATIVE NOTE   Date of Procedure:04/13/2019  Preoperative Diagnosis:Severe Aortic Stenosis   Postoperative Diagnosis:Same   Procedure:   Transcatheter Aortic Valve Replacement - PercutaneousRightTransfemoral Approach Edwards Sapien 3 Ultra THV (size 79mm, model # 9750TFX, serial # [id number])  Co-Surgeons:Clarence H. [name] Manns, MD and Sherren Mocha, MD  Anesthesiologist:Stephen Gifford Shave, MD  Echocardiographer:Mihai Croitoru, MD  Pre-operative Echo Findings: ? Severe aortic stenosis ? Normalleft ventricular systolic function  Post-operative Echo Findings: ? Noparavalvular leak ? Normalleft ventricular systolic function  _____________   Echo 04/14/19 IMPRESSIONS  1. Left ventricular ejection fraction, by visual estimation, is 60 to 65%. The left ventricle has normal function. There is moderately increased left ventricular hypertrophy.  2. Left ventricular diastolic function could not be evaluated.  3. The left ventricle has no regional wall motion abnormalities.  4. Global right ventricle has normal systolic function.The right ventricular size is normal. No increase in right ventricular wall thickness.  5. Left atrial size was severely dilated.  6. Right atrial size was mild-moderately dilated.  7. Small pericardial effusion.  8. The pericardial effusion is circumferential.  9. Mild mitral annular calcification. 10. The mitral valve is normal in structure. Trivial mitral valve regurgitation. 11. The tricuspid valve is normal in structure. 12. Aortic stent-valve prosthesis is well-seated, without perivalvular leak. 13. Aortic valve mean gradient measures 12.5  mmHg. 14. Aortic valve peak gradient measures 21.4 mmHg. 15. Aortic valve regurgitation is not visualized. 16. The pulmonic valve was not well visualized. Pulmonic valve regurgitation is not visualized. 17. Mildly elevated pulmonary artery systolic pressure. 18. The tricuspid regurgitant velocity is 2.81 m/s, and with an assumed right atrial pressure of 15 mmHg, the estimated right ventricular systolic pressure is mildly elevated at 46.6 mmHg. 19. The inferior vena cava is dilated in size with <50% respiratory variability, suggesting right atrial pressure of 15 mmHg.  Aortic Valve: The aortic valve has been repaired/replaced. Aortic valve regurgitation is not visualized. Aortic valve mean gradient measures 12.5 mmHg. Aortic valve peak gradient measures 21.4 mmHg. Aortic valve White, by VTI measures 1.73 cm. 26 Edwards Edwards Sapien bioprosthetic, stented aortic valve (TAVR) valve is present in the aortic position. Procedure Date: 04/13/2019 Echo findings show normal structure and function of the aortic prosthesis.  ___________________   Echo 04/13/19 IMPRESSIONS  1. Left ventricular ejection fraction, by estimation, is 60 to 65%. The left ventricle has normal function. The left ventricle has no regional wall motion abnormalities. There is mild concentric left ventricular hypertrophy. Indeterminate diastolic  filling due to E-A fusion.  2. Right ventricular systolic function is normal. The right ventricular size is normal. There is normal pulmonary artery systolic pressure. The estimated right ventricular systolic pressure is 47.8 mmHg.  3. Left atrial size was moderately dilated.  4. Right atrial size was mildly dilated.  5. The mitral valve is normal in structure. Trivial mitral valve regurgitation.  6. There is a 26 mm Sapien prosthetic (TAVR) valve present in the aortic position. Procedure Date: 04/13/2019. The valve appears well seated with normal function by doppler interrogation. Mean  gradient 48mmHg, AVA 1.8cm2, DI 0.47. There is no  paravalvular leak.  7. The inferior vena cava is normal in size with greater than 50% respiratory variability, suggesting right atrial pressure of 3 mmHg.  Comparison(s): No significant change from prior study  ASSESSMENT & PLAN:   Severe AS s/p TAVR: echo today shows EF 60%, normally functioning TAVR with a mean gradient of 8 mm hg and no PVL. He has NYHA class I symptoms. SBE prophylaxis discussed; I have RX'd amoxicillin. Continue on Eliquis 5 mg BID alone. Continue regular follow up with Dr. Haroldine White   Chronic diastolic CHF: appears euvolemic. He said he had recent labs at the [place]   HTN: BP well controlled. Reviewed home log which is mostly under good control. No changes made  Chronic afib/flutter: rate well controlled. continue Eliquis.   CLL: on a new medication called [place]. Brought in pamphlet and wanted to make sure it was oky with his heart. It can cause prolonged Qt. I chcked an ecg today which showed chronic afib with normal QTc.    Medication Adjustments/Labs and Tests Ordered: Current medicines are reviewed at length with the patient today.  Concerns regarding medicines are outlined above.  Medication changes, Labs and Tests ordered today are listed in the Patient Instructions below. Patient Instructions  Medication Instructions:  No changes *If you need a refill on your cardiac medications before your next appointment, please call  your pharmacy*   Lab Work: none If you have labs (blood work) drawn today and your tests are completely normal, you will receive your results only by: [name] MyChart Message (if you have MyChart) OR . A paper copy in the mail If you have any lab test that is abnormal or we need to change your treatment, we will call you to review the results.   Testing/Procedures: none   Follow-Up: At [facility], you and your health needs are our priority.  As part of our continuing mission to  provide you with exceptional heart care, we have created designated Provider Care Teams.  These Care Teams include your primary Cardiologist (physician) and Advanced Practice Providers (APPs -  Physician Assistants and Nurse Practitioners) who all work together to provide you with the care you need, when you need it.   Your next appointment:   6 month(s)  The format for your next appointment:   In Person  Provider:   Dr. Haroldine White   Other Instructions      Signed, Angelena Form, PA-C  04/13/2020 11:36 AM    [address], [place], [place]  [address] Phone: [phone]; Fax: [phone]

## 2020-04-13 ENCOUNTER — Other Ambulatory Visit: Payer: Self-pay | Admitting: Physician Assistant

## 2020-04-14 NOTE — Addendum Note (Signed)
Addended by: Rodman Key on: 04/14/2020 12:48 PM   Modules accepted: Orders

## 2020-05-10 DIAGNOSIS — H31091 Other chorioretinal scars, right eye: Secondary | ICD-10-CM | POA: Diagnosis not present

## 2020-05-10 DIAGNOSIS — H35351 Cystoid macular degeneration, right eye: Secondary | ICD-10-CM | POA: Diagnosis not present

## 2020-05-10 DIAGNOSIS — H43812 Vitreous degeneration, left eye: Secondary | ICD-10-CM | POA: Diagnosis not present

## 2020-05-15 ENCOUNTER — Ambulatory Visit (INDEPENDENT_AMBULATORY_CARE_PROVIDER_SITE_OTHER): Payer: Medicare Other | Admitting: Family

## 2020-05-15 ENCOUNTER — Encounter: Payer: Self-pay | Admitting: Internal Medicine

## 2020-05-15 ENCOUNTER — Other Ambulatory Visit: Payer: Self-pay

## 2020-05-15 VITALS — BP 120/82 | HR 98 | Temp 98.6°F | Ht 74.0 in

## 2020-05-15 DIAGNOSIS — L03116 Cellulitis of left lower limb: Secondary | ICD-10-CM | POA: Diagnosis not present

## 2020-05-15 MED ORDER — CEPHALEXIN 500 MG PO CAPS: 500.0000 mg | ORAL_CAPSULE | Freq: Four times a day (QID) | ORAL | 0 refills | Status: DC

## 2020-05-15 NOTE — Progress Notes (Signed)
[name] White is a 85 y.o. male with the following history as recorded in EpicCare:  Patient Active Problem List   Diagnosis Date Noted  . Cellulitis of foot 02/23/2020  . 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 Retinal detachment 06/17/2019  . CML (chronic myelocytic leukemia) ([place]) 04/20/2019  . Scrotal swelling 04/20/2019  . 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  . Severe aortic stenosis   . Pleural effusion, bilateral   . Acute on chronic diastolic heart failure ([place])   . Side effect of medication 05/19/2018  . Vitamin D deficiency 11/24/2015  . Anemia 06/04/2015  . Measles   . Allergic rhinitis 07/14/2013  . Biceps rupture, distal 09/16/2012  . Coronary atherosclerosis of native coronary artery 08/31/2011  . Chronic atrial fibrillation ([place]) 06/25/2011  . Diabetes mellitus type 2 in obese ([place]) 04/10/2009  . Bradycardia 03/13/2009  . Obstructive sleep apnea 08/29/2008  . Hypothyroidism 09/23/2006  . Hyperlipidemia 09/23/2006  . Depression with anxiety 09/23/2006  . Essential hypertension 09/23/2006  . GERD 09/23/2006    Current Outpatient Medications  Medication Sig Dispense Refill  . amoxicillin (AMOXIL) 500 MG tablet Take 4 capsules (2,000 mg) one hour prior to all dental visits. 8 tablet 11  . apixaban (ELIQUIS) 5 MG TABS tablet Take 1 tablet (5 mg total) by mouth 2 (two) times daily. 180 tablet 3  . APPLE CIDER VINEGAR PO Take 1 capsule by mouth daily.    . brimonidine (ALPHAGAN) 0.15 % ophthalmic solution     . cetirizine (ZYRTEC) 10 MG tablet Take 1 tablet (10 mg total) by mouth daily. 90 tablet 3  . CINNAMON PO Take 2,000 mg by mouth 2 (two) times daily.     . cloNIDine (CATAPRES) 0.1 MG tablet TAKE 1 TABLET AS NEEDED FOR SYSTOLIC BLOOD PRESSURE GREATER THAN 160 90 tablet 1  . Coenzyme Q10 (CVS COQ-10) 200 MG capsule Take 200 mg by mouth every evening.     . ergocalciferol (VITAMIN D2) 1.25 MG (50000 UT) capsule Take 50,000 Units by mouth  every Monday.    . famotidine (PEPCID) 20 MG tablet Take 20 mg by mouth 2 (two) times daily.    . fenofibrate (TRICOR) 145 MG tablet TAKE 1 TABLET BY MOUTH EVERY DAY 90 tablet 1  . ferrous sulfate 325 (65 FE) MG tablet Take 325 mg by mouth every evening.     . finasteride (PROSCAR) 5 MG tablet Take 5 mg by mouth every evening.    . fluticasone (FLONASE) 50 MCG/ACT nasal spray Place 2 sprays into both nostrils daily. 16 g 6  . latanoprost (XALATAN) 0.005 % ophthalmic solution 1 drop at bedtime.    [name] levothyroxine (SYNTHROID) 200 MCG tablet Take 200 mcg by mouth daily before breakfast.    . nilotinib (TASIGNA) 150 MG capsule Take 300 mg by mouth 2 (two) times daily. Take on an empty stomach, 1 hr before or 2 hrs after food.    [name] PARoxetine (PAXIL) 40 MG tablet Take 1 tablet (40 mg total) by mouth every morning. 90 tablet 1  . potassium chloride SA (KLOR-CON M20) 20 MEQ tablet Take 1 tablet (20 mEq total) by mouth daily. 90 tablet 1  . PROLENSA 0.07 % SOLN Apply to eye.    . rosuvastatin (CRESTOR) 10 MG tablet TAKE 1 TABLET BY MOUTH EVERY DAY 90 tablet 3  . spironolactone (ALDACTONE) 25 MG tablet Take 0.5 tablets (12.5 mg total) by mouth daily. (Patient taking differently: Take 25 mg by mouth daily.)  45 tablet 3  . torsemide (DEMADEX) 20 MG tablet TAKE 1 TABLET (20 MG TOTAL) BY MOUTH DAILY. MAY TAKE AND ADDITIONAL TAB IN THE EVENING 220 tablet 3  . traZODone (DESYREL) 50 MG tablet Take 50 mg by mouth at bedtime.    . TURMERIC PO Take 1,000 mg by mouth every evening.     . cephALEXin (KEFLEX) 500 MG capsule Take 1 capsule (500 mg total) by mouth 4 (four) times daily. 40 capsule 0   No current facility-administered medications for this visit.    Allergies: Ace inhibitors, Benazepril, Codeine, Statins, Piroxicam, Pseudoephedrine, Dasatinib, and Betadine [povidone iodine]  Past Medical History:  Diagnosis Date  . Anemia 06/04/2015  . Anxiety   . Atrial flutter ([place])    s/p ablation 07/17/08  .  Cancer ([place])    leukemia - dx 05/2018  . Chronic diastolic CHF (congestive heart failure) ([place])   . Coronary artery disease   . Depression   . Diabetes mellitus type 2 in obese [facility]) 04/10/2009   Qualifier: Diagnosis of  By: Arnoldo Morale MD, Balinda Quails, diet controlled, lost 60lbs  . Emphysema of lung ([place])   . GERD (gastroesophageal reflux disease)   . Glaucoma    recent dx - has an appt to be evaluated and meds to treat  . H/O hiatal hernia   . Hearing loss    some hearing loss in left ear but has not been dx, no hearing aids  . Hepatitis    h/o of type A  . History of measles as a child   . History of mumps as a child   . Hypertension   . Hypothyroidism   . Micturition syncope   . Osteoarthritis   . Pleural effusion, bilateral   . 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 Severe aortic stenosis   . Sleep apnea    uses CPAP nightly  . Vitamin D deficiency 11/24/2015    Past Surgical History:  Procedure Laterality Date  . CARDIAC CATHETERIZATION  03/18/2019  . CARDIAC ELECTROPHYSIOLOGY MAPPING AND ABLATION  06/2008  . CATARACT EXTRACTION W/ INTRAOCULAR LENS  IMPLANT, BILATERAL  ~ 2007  . COLONOSCOPY    . FOOT TENDON SURGERY Left   . INTRAOPERATIVE TRANSTHORACIC ECHOCARDIOGRAM N/A 04/13/2019   Procedure: TRANSTHORACIC ECHOCARDIOGRAM;  Surgeon: Sherren Mocha, MD;  Location: [place];  Service: Open Heart Surgery;  Laterality: N/A;  . RIGHT/LEFT HEART CATH AND CORONARY ANGIOGRAPHY N/A 03/18/2019   Procedure: RIGHT/LEFT HEART CATH AND CORONARY ANGIOGRAPHY;  Surgeon: Jolaine Artist, MD;  Location: [place] [facility];  Service: Cardiovascular;  Laterality: N/A;  . TONSILLECTOMY  1940's  . TOTAL KNEE ARTHROPLASTY  1990's   right  . TOTAL KNEE ARTHROPLASTY Left 08/02/2013   Procedure: LEFT TOTAL KNEE ARTHROPLASTY;  Surgeon: Gearlean Alf, MD;  [facility];   Service: Orthopedics;  Laterality: Left;  . TRANSCATHETER AORTIC VALVE REPLACEMENT, TRANSFEMORAL N/A 04/13/2019   Procedure: TRANSCATHETER AORTIC VALVE REPLACEMENT, TRANSFEMORAL;  Surgeon: Sherren Mocha, MD;  Location: [place];  Service: Open Heart Surgery;  Laterality: N/A;  . UPPER GI ENDOSCOPY    . WISDOM TOOTH EXTRACTION      Family History  Problem Relation Age of Onset  . Multiple sclerosis Father   . Heart disease Mother        atrial fibrillation  . Hypertension Son   . Heart disease Maternal  Grandfather     Social History   Tobacco Use  . Smoking status: Former Smoker    Years: 0.00    Types: Pipe, Cigars    Quit date: 04/01/1996    Years since quitting: 24.1  . Smokeless tobacco: Former Systems developer    Types: Chew    Quit date: 04/01/2009  Substance Use Topics  . Alcohol use: Not Currently    Alcohol/week: 0.0 standard drinks    Comment: Quit wine early 2020.    Subjective:  Recurrent cellulitis; started 2 days ago; similar symptoms in November; suspected to be reaction to medication being used to treat his CML; planning to discuss treatment alternatives for the [facility] with his provider at the [place].  On Eliquis; no prior history of gout;   Objective:  Vitals:   05/15/20 1432  BP: 120/82  Pulse: 98  Temp: 98.6 F (37 C)  TempSrc: Oral  SpO2: 97%  Height: 6\' 2"  (1.88 m)    General: Well developed, well nourished, in no acute distress  Skin : Warm and dry.  Head: Normocephalic and atraumatic  Eyes: Sclera and conjunctiva clear; pupils round and reactive to light; extraocular movements intact  Ears: External normal; canals clear; tympanic membranes normal  Oropharynx: Pink, supple. No suspicious lesions  Neck: Supple without thyromegaly, adenopathy  Lungs: Respirations unlabored;  Musculoskeletal: No deformities; erythema noted of 3rd toe left foot;  Extremities: + edema, cyanosis, clubbing  Vessels: Symmetric bilaterally  Neurologic: Alert and oriented; speech intact;  face symmetrical; moves all extremities well; CNII-XII intact without focal deficit   Assessment:  1. Cellulitis of left lower extremity     Plan:  As patient is on Eliquis, low concern for DVT; start Keflex which has worked in the past and will consider imaging if no relief within 24-48 hours as has been his typical response in the past;  This visit occurred during the SARS-CoV-2 public health emergency.  Safety protocols were in place, including screening questions prior to the visit, additional usage of staff PPE, and extensive cleaning of exam room while observing appropriate contact time as indicated for disinfecting solutions.     No follow-ups on file.  No orders of the defined types were placed in this encounter.   Requested Prescriptions   Signed Prescriptions Disp Refills  . cephALEXin (KEFLEX) 500 MG capsule 40 capsule 0    Sig: Take 1 capsule (500 mg total) by mouth 4 (four) times daily.

## 2020-05-15 NOTE — Telephone Encounter (Signed)
error 

## 2020-05-18 DIAGNOSIS — L570 Actinic keratosis: Secondary | ICD-10-CM | POA: Diagnosis not present

## 2020-05-18 DIAGNOSIS — L538 Other specified erythematous conditions: Secondary | ICD-10-CM | POA: Diagnosis not present

## 2020-05-18 DIAGNOSIS — D485 Neoplasm of uncertain behavior of skin: Secondary | ICD-10-CM | POA: Diagnosis not present

## 2020-06-24 ENCOUNTER — Other Ambulatory Visit (HOSPITAL_COMMUNITY): Payer: Self-pay | Admitting: Internal Medicine

## 2020-07-05 DIAGNOSIS — H31091 Other chorioretinal scars, right eye: Secondary | ICD-10-CM | POA: Diagnosis not present

## 2020-07-05 DIAGNOSIS — H43812 Vitreous degeneration, left eye: Secondary | ICD-10-CM | POA: Diagnosis not present

## 2020-07-05 DIAGNOSIS — H35351 Cystoid macular degeneration, right eye: Secondary | ICD-10-CM | POA: Diagnosis not present

## 2020-08-01 DIAGNOSIS — L578 Other skin changes due to chronic exposure to nonionizing radiation: Secondary | ICD-10-CM | POA: Diagnosis not present

## 2020-08-01 DIAGNOSIS — L905 Scar conditions and fibrosis of skin: Secondary | ICD-10-CM | POA: Diagnosis not present

## 2020-08-06 ENCOUNTER — Other Ambulatory Visit (HOSPITAL_COMMUNITY): Payer: Self-pay | Admitting: Internal Medicine

## 2020-09-01 DIAGNOSIS — H401131 Primary open-angle glaucoma, bilateral, mild stage: Secondary | ICD-10-CM | POA: Diagnosis not present

## 2020-10-12 ENCOUNTER — Telehealth: Payer: Self-pay | Admitting: Internal Medicine

## 2020-10-12 NOTE — Chronic Care Management (AMB) (Signed)
  Chronic Care Management   Note  10/12/2020 Name: [name] White MRN: [id number] DOB: 04-25-1935  [name] White is a 85 y.o. year old male who is a primary care patient of Plotnikov, Evie Lacks, MD. I reached out to [name] by phone today in response to a referral sent by [name] White PCP, Plotnikov, Evie Lacks, MD.   [name] White was given information about Chronic Care Management services today including:  CCM service includes personalized support from designated clinical staff supervised by his physician, including individualized plan of care and coordination with other care providers 24/7 contact phone numbers for assistance for urgent and routine care needs. Service will only be billed when office clinical staff spend 20 minutes or more in a month to coordinate care. Only one practitioner may furnish and bill the service in a calendar month. The patient may stop CCM services at any time (effective at the end of the month) by phone call to the office staff.   Patient agreed to services and verbal consent obtained.   Follow up plan:   Lauretta Grill Upstream Scheduler

## 2020-10-29 ENCOUNTER — Other Ambulatory Visit (HOSPITAL_COMMUNITY): Payer: Self-pay | Admitting: Internal Medicine

## 2020-11-07 ENCOUNTER — Telehealth (HOSPITAL_COMMUNITY): Payer: Self-pay | Admitting: Vascular Surgery

## 2020-11-07 ENCOUNTER — Telehealth: Payer: Self-pay | Admitting: Internal Medicine

## 2020-11-07 NOTE — Telephone Encounter (Signed)
Pt called he is having trouble breathing, he is having chest tighting.. he will like ASAP appt w/ db. He wants to speak to [place] only.. please advise

## 2020-11-07 NOTE — Progress Notes (Signed)
Advanced Heart Failure Note   Date:  11/08/2020   ID:  [name] White, DOB Feb 01, 1936, MRN [id number]  Location: Home  Provider location: [facility] Type of Visit: Established patient  PCP:  Plotnikov, Evie Lacks, MD  Cardiologist:  None Primary HF: [place]  Chief Complaint: Worsening dyspnea   HPI: [name] White is a delightful 85 y.o. male with a history of non-obstructive CAD, micturition syncope, hypertension, hyperlipidemia,borderline diabetes, obesity, sleep apnea, atrial flutter s/p a. flutter ablation by Dr. Lovena White 06/2008, PAF, CML and severe AS s/p TAVR 04/13/19.    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 Being treated at [facility]. Has lost over 40 pounds. BP down so clonidine stopped.    Echo 10/20 EF 55-60% RV ok. Severe AS mean gradient 13mHG Small effusion. Mild to mod MR/TR. S/p TAVR on 04/13/19.    Echo 05/17/19 EF 60-65%. TAVR ok. RV ok. Mod TR  Personally reviewed  He presents for routine f/u. Says he feels great. No edema or SOB. Working out in yard without problem. Recently had right retinal detachment. No dizziness.  No bleeding with Eliquis.  Today he returns for an acute visit with new complaints of increasing fatigue and SOB for the past month. He was last seen in our clinic 07/2019. Blood sugars have been recently elevated and he has modified his diet to lower blood sugars, has subsequently lost weight. Some chest tightness. Denies dizziness, edema, or PND/Orthopnea. Appetite ok. No fever or chills. Weight at home 211 pounds. Taking all medications. No bleeding issues with Eliquis. He is concerned about his hoarseness, says he has heartburn.  Cardiac studies: - Echo (1/22): EF 60%, RV ok, TAVR ok.   - Echo (2/21): EF 60-65%. TAVR ok. RV ok. Mod TR   - Echo (10/20): EF 55-60% RV ok. Severe AS mean gradient 564mG Small effusion. Mild to mod MR/TR. S/p TAVR on 04/13/19.   - R/LHC (12/20):   Prox RCA to Mid RCA lesion is 30%  stenosed. RPAV lesion is 30% stenosed. Ost Cx to Prox Cx lesion is 50% stenosed. Prox LAD to Mid LAD lesion is 20% stenosed.   Findings:   Ao = 82/51(65)  Lv = 138/8   RA = 5 RV = 38/6 PA = 37/13 (22) PCW = 14  Fick cardiac output/index = 9.8/4.3 PVR = 0.7 WU FA sat = 96% PA sat = 78%, 77% High SVC 75%   AoV = peak gradient 5131mG, mean gradient 36 mmHG, AVA 1.4 cm2   Past Medical History:  Diagnosis Date   Anemia 06/04/2015   Anxiety    Atrial flutter ([facility]  s/p ablation 07/17/08   Cancer ([facility]  leukemia - dx 05/2018   Chronic diastolic CHF (congestive heart failure) ([facility]  Coronary artery disease    Depression    Diabetes mellitus type 2 in obese ([facility]/12/2009   Qualifier: Diagnosis of  By: JenArnoldo Morale, JohBalinda Quailsiet controlled, lost 60lbs   Emphysema of lung ([facility])    GERD (gastroesophageal reflux disease)    Glaucoma    recent dx - has an appt to be evaluated and meds to treat   H/O hiatal hernia    Hearing loss    some hearing loss in left ear but has not been dx, no hearing aids   Hepatitis    h/o of type A   History of measles as a child    History  of mumps as a child    Hypertension    Hypothyroidism    Micturition syncope    Osteoarthritis    Pleural effusion, bilateral    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   26 mm Edwards Sapien 3 transcatheter heart valve placed via percutaneous right transfemoral approach    Severe aortic stenosis    Sleep apnea    uses CPAP nightly   Vitamin D deficiency 11/24/2015   Past Surgical History:  Procedure Laterality Date   CARDIAC CATHETERIZATION  03/18/2019   CARDIAC ELECTROPHYSIOLOGY MAPPING AND ABLATION  06/2008   CATARACT EXTRACTION W/ INTRAOCULAR LENS  IMPLANT, BILATERAL  ~ 2007   COLONOSCOPY     FOOT TENDON SURGERY Left    INTRAOPERATIVE TRANSTHORACIC ECHOCARDIOGRAM N/A 04/13/2019   Procedure: TRANSTHORACIC ECHOCARDIOGRAM;  Surgeon: Sherren Mocha, MD;  Location: [place];  Service:  Open Heart Surgery;  Laterality: N/A;   RIGHT/LEFT HEART CATH AND CORONARY ANGIOGRAPHY N/A 03/18/2019   Procedure: RIGHT/LEFT HEART CATH AND CORONARY ANGIOGRAPHY;  Surgeon: Jolaine Artist, MD;  Location: [place] [facility];  Service: Cardiovascular;  Laterality: N/A;   TONSILLECTOMY  1940's   TOTAL KNEE ARTHROPLASTY  1990's   right   TOTAL KNEE ARTHROPLASTY Left 08/02/2013   Procedure: LEFT TOTAL KNEE ARTHROPLASTY;  Surgeon: Gearlean Alf, MD;  [facility];  Service: Orthopedics;  Laterality: Left;   TRANSCATHETER AORTIC VALVE REPLACEMENT, TRANSFEMORAL N/A 04/13/2019   Procedure: TRANSCATHETER AORTIC VALVE REPLACEMENT, TRANSFEMORAL;  Surgeon: Sherren Mocha, MD;  Location: [place];  Service: Open Heart Surgery;  Laterality: N/A;   UPPER GI ENDOSCOPY     WISDOM TOOTH EXTRACTION     Current Outpatient Medications  Medication Sig Dispense Refill   apixaban (ELIQUIS) 5 MG TABS tablet Take 1 tablet (5 mg total) by mouth 2 (two) times daily. 180 tablet 3   brimonidine (ALPHAGAN) 0.15 % ophthalmic solution      CINNAMON PO Take 2,000 mg by mouth 2 (two) times daily.      cloNIDine (CATAPRES) 0.1 MG tablet TAKE 1 TABLET AS NEEDED FOR SYSTOLIC BLOOD PRESSURE GREATER THAN 160 90 tablet 1   Coenzyme Q10 (CVS COQ-10) 200 MG capsule Take 200 mg by mouth every evening.      famotidine (PEPCID) 20 MG tablet Take 20 mg by mouth 2 (two) times daily.     ferrous sulfate 325 (65 FE) MG tablet Take 325 mg by mouth every evening.      finasteride (PROSCAR) 5 MG tablet Take 5 mg by mouth every evening.     KLOR-CON M20 20 MEQ tablet TAKE 2 TABLETS (40 MEQ TOTAL) BY MOUTH EVERY EVENING. 180 tablet 3   levothyroxine (SYNTHROID) 200 MCG tablet Take 200 mcg by mouth daily before breakfast.     nilotinib (TASIGNA) 150 MG capsule Take 300 mg by mouth 2 (two) times daily. Take on an empty stomach, 1 hr before or 2 hrs after food.     PARoxetine (PAXIL) 40 MG tablet Take 1 tablet (40 mg total) by mouth every  morning. 90 tablet 1   rosuvastatin (CRESTOR) 10 MG tablet Take 1 tablet (10 mg total) by mouth daily. Please schedule an appointment for further refills 30 tablet 0   spironolactone (ALDACTONE) 25 MG tablet Take 25 mg by mouth daily.     torsemide (DEMADEX) 20 MG tablet TAKE 1 TABLET (20 MG TOTAL) BY MOUTH DAILY. MAY TAKE AND ADDITIONAL TAB IN PM 135 tablet 0   traZODone (DESYREL)  50 MG tablet Take 50 mg by mouth at bedtime.     TURMERIC PO Take 1,000 mg by mouth every evening.      amoxicillin (AMOXIL) 500 MG tablet Take 4 capsules (2,000 mg) one hour prior to all dental visits. (Patient not taking: Reported on 11/08/2020) 8 tablet 11   ergocalciferol (VITAMIN D2) 1.25 MG (50000 UT) capsule Take 50,000 Units by mouth every Monday. (Patient not taking: Reported on 11/08/2020)     No current facility-administered medications for this encounter.   Allergies:   Ace inhibitors, Benazepril, Codeine, Statins, Piroxicam, Pseudoephedrine, Dasatinib, and Betadine [povidone iodine]   Social History:  The patient  reports that he quit smoking about 24 years ago. His smoking use included pipe and cigars. He quit smokeless tobacco use about 11 years ago.  His smokeless tobacco use included chew. He reports previous alcohol use. He reports that he does not use drugs.   Family History:  The patient's family history includes Heart disease in his maternal grandfather and mother; Hypertension in his son; Multiple sclerosis in his father.   ROS:  Please see the history of present illness.   All other systems are personally reviewed and negative.   Recent Labs: No results found for requested labs within last 8760 hours.  Personally reviewed   Wt Readings from Last 3 Encounters:  11/08/20 97.2 kg (214 lb 3.2 oz)  04/12/20 106.1 kg (233 lb 12.8 oz)  02/23/20 105.6 kg (232 lb 12.8 oz)    BP 110/70   Pulse 60   Wt 97.2 kg (214 lb 3.2 oz)   BMI 27.50 kg/m   Physical Exam:  General:  NAD. No resp difficulty,  walked into clinic w/ cane HEENT: Normal, hoarse Neck: Supple. JVP 6-7. Carotids 2+ bilat; no bruits. No lymphadenopathy or thryomegaly appreciated. Cor: PMI nondisplaced. Irregular rate & rhythm. No rubs, gallops or murmurs. Lungs: Clear Abdomen: Soft, nontender, nondistended. No hepatosplenomegaly. No bruits or masses. Good bowel sounds. Extremities: No cyanosis, clubbing, rash, edema Neuro: Alert & oriented x 3, cranial nerves grossly intact. Moves all 4 extremities w/o difficulty. Affect pleasant.  ECG: Atrial fibrillation, 87 bpm (personally reviewed).   ReDs: 50%  ASSESSMENT AND PLAN: 1. Dyspnea - Likely multifactorial (CML vs HF) - Lung sounds are clear, diminished in bases. - No fever, cough, chills or recent sick contacts. - Appears mildly volume up today. - Increase torsemide to 40 mg bid x 2 days then back to 20 mg daily. He will take an extra 20 mEq of KCl for 2 days only. - Will send for CXR (? Pleural effusion causing elevated ReDs reading).  2. HTN - Stable on current regimen. - Has clonidine 0.1 prn for SBP > = 160 but hasn't had to use.   3. Chronic Diastolic HF in setting of severe AS. - Echo (2/22): EF 60%, normal RV - Worsening NYHA III. He appears at least mildly volume up, however ReDs is 50%. - Increase torsemide as above. - Continue spiro 12.5 mg daily. - BMET & BNP today, repeat BMET in 10 days. - Will update an echo, if still persistently dyspneic at follow up w/ Dr. Haroldine Laws will repeat RHC. - ? Need for amyloid w/u. Can consider cMRI as needed.    4. Severe AS - s/p TAVR 1/21. - Valve good on recent echo (1/22), mean gradient of 8 mmHg. - Aware of need for SBE prophylaxis. - Repeat echo as above.   5. CAD - Cath 12/20 with stable  non-obstructive CAD. - Having some chest tightness recently (fluid vs. worsening CAD). - Not on ASA due to Eliquis.  - Not on BB with bradycardia. - Continue Crestor 10 mg daily.   6. CML - Following with Dr. Don Broach at [facility]. - Now on nilotinib.   7. Atrial fibrillation, permanent - Tikosyn stopped 5/17 due to persistent AF. - Remains in AF today. Rate ok.  - Continue Eliquis 5 bid. - CBC today.  8. Carotid Stenosis - Carotid duplex 12/2013; stable 0-39% bilateral ICA stenosis. - Repeat carotid dopplers (7/22) no hemodynamically significant stenosis - Continue statin. - F/u as needed.   9. OSA - Not using CPAP any more with > 50 pound weight loss.   Personally discussed with Dr. Haroldine Laws. Follow up in 1-2 months with Dr. Haroldine Laws + echo (may need RHC if symptoms not improving).  [name] Poot, [name] White  11/08/2020 8:47 AM  Advanced Heart Failure [place] [address] [phone] (office) [phone] (fax)

## 2020-11-07 NOTE — Telephone Encounter (Signed)
Spoke w/pt. He denies CP or edema. He reports wt continues to drop. He states at rest his breathing is fine but if he gets up and walks across the room he feels very out of breath and is panting. Pt reports taking Torsemide 20 mg Daily. Advised pt to take an extra dose of Torsemide today to see if that helps and appt sch for tom AM at 8:30. Pt thankful for call

## 2020-11-07 NOTE — Chronic Care Management (AMB) (Signed)
  Chronic Care Management   Note  11/07/2020 Name: [name] White MRN: [id number] DOB: 1935-04-09  [name] White is a 85 y.o. year old male who is a primary care patient of Plotnikov, Evie Lacks, MD. I reached out to [name] by phone today in response to a referral sent by [name] White PCP, Plotnikov, Evie Lacks, MD.   [name] White was given information about Chronic Care Management services today including:  CCM service includes personalized support from designated clinical staff supervised by his physician, including individualized plan of care and coordination with other care providers 24/7 contact phone numbers for assistance for urgent and routine care needs. Service will only be billed when office clinical staff spend 20 minutes or more in a month to coordinate care. Only one practitioner may furnish and bill the service in a calendar month. The patient may stop CCM services at any time (effective at the end of the month) by phone call to the office staff.   Patient wishes to consider information provided and/or speak with a member of the care team before deciding about enrollment in care management services.   Follow up plan:   [name] White Upstream Scheduler

## 2020-11-08 ENCOUNTER — Other Ambulatory Visit: Payer: Self-pay

## 2020-11-08 ENCOUNTER — Ambulatory Visit (HOSPITAL_COMMUNITY)
Admission: RE | Admit: 2020-11-08 | Discharge: 2020-11-08 | Disposition: A | Discharge status: Home / Self Care | Payer: Medicare Other | Source: Ambulatory Visit | Attending: Family Medicine | Admitting: Family Medicine

## 2020-11-08 ENCOUNTER — Encounter (HOSPITAL_COMMUNITY): Payer: Self-pay

## 2020-11-08 ENCOUNTER — Other Ambulatory Visit (HOSPITAL_COMMUNITY): Payer: Self-pay | Admitting: Family Medicine

## 2020-11-08 VITALS — BP 110/70 | HR 60 | Wt 214.2 lb

## 2020-11-08 DIAGNOSIS — Z952 Presence of prosthetic heart valve: Secondary | ICD-10-CM | POA: Insufficient documentation

## 2020-11-08 DIAGNOSIS — Z87891 Personal history of nicotine dependence: Secondary | ICD-10-CM | POA: Insufficient documentation

## 2020-11-08 DIAGNOSIS — R0602 Shortness of breath: Secondary | ICD-10-CM | POA: Insufficient documentation

## 2020-11-08 DIAGNOSIS — I6523 Occlusion and stenosis of bilateral carotid arteries: Secondary | ICD-10-CM | POA: Diagnosis not present

## 2020-11-08 DIAGNOSIS — G4733 Obstructive sleep apnea (adult) (pediatric): Secondary | ICD-10-CM | POA: Insufficient documentation

## 2020-11-08 DIAGNOSIS — Z888 Allergy status to other drugs, medicaments and biological substances status: Secondary | ICD-10-CM | POA: Diagnosis not present

## 2020-11-08 DIAGNOSIS — Z885 Allergy status to narcotic agent status: Secondary | ICD-10-CM | POA: Diagnosis not present

## 2020-11-08 DIAGNOSIS — J90 Pleural effusion, not elsewhere classified: Secondary | ICD-10-CM | POA: Insufficient documentation

## 2020-11-08 DIAGNOSIS — I6529 Occlusion and stenosis of unspecified carotid artery: Secondary | ICD-10-CM

## 2020-11-08 DIAGNOSIS — Z8249 Family history of ischemic heart disease and other diseases of the circulatory system: Secondary | ICD-10-CM | POA: Diagnosis not present

## 2020-11-08 DIAGNOSIS — I110 Hypertensive heart disease with heart failure: Secondary | ICD-10-CM | POA: Insufficient documentation

## 2020-11-08 DIAGNOSIS — Z79899 Other long term (current) drug therapy: Secondary | ICD-10-CM | POA: Diagnosis not present

## 2020-11-08 DIAGNOSIS — I2510 Atherosclerotic heart disease of native coronary artery without angina pectoris: Secondary | ICD-10-CM | POA: Insufficient documentation

## 2020-11-08 DIAGNOSIS — I5032 Chronic diastolic (congestive) heart failure: Secondary | ICD-10-CM | POA: Diagnosis not present

## 2020-11-08 DIAGNOSIS — I350 Nonrheumatic aortic (valve) stenosis: Secondary | ICD-10-CM | POA: Diagnosis not present

## 2020-11-08 DIAGNOSIS — I482 Chronic atrial fibrillation, unspecified: Secondary | ICD-10-CM

## 2020-11-08 DIAGNOSIS — I10 Essential (primary) hypertension: Secondary | ICD-10-CM

## 2020-11-08 DIAGNOSIS — C9210 Chronic myeloid leukemia, BCR/ABL-positive, not having achieved remission: Secondary | ICD-10-CM | POA: Diagnosis not present

## 2020-11-08 DIAGNOSIS — Z7901 Long term (current) use of anticoagulants: Secondary | ICD-10-CM | POA: Insufficient documentation

## 2020-11-08 DIAGNOSIS — R634 Abnormal weight loss: Secondary | ICD-10-CM | POA: Diagnosis not present

## 2020-11-08 DIAGNOSIS — I4821 Permanent atrial fibrillation: Secondary | ICD-10-CM | POA: Insufficient documentation

## 2020-11-08 LAB — BASIC METABOLIC PANEL
Anion gap: 12 (ref 5–15)
BUN: 56 mg/dL — ABNORMAL HIGH (ref 8–23)
CO2: 21 mmol/L — ABNORMAL LOW (ref 22–32)
Calcium: 9.4 mg/dL (ref 8.9–10.3)
Chloride: 96 mmol/L — ABNORMAL LOW (ref 98–111)
Creatinine, Ser: 1.92 mg/dL — ABNORMAL HIGH (ref 0.61–1.24)
GFR, Estimated: 34 mL/min — ABNORMAL LOW (ref >60)
Glucose, Bld: 368 mg/dL — ABNORMAL HIGH (ref 70–99)
Potassium: 5.6 mmol/L — ABNORMAL HIGH (ref 3.5–5.1)
Sodium: 129 mmol/L — ABNORMAL LOW (ref 135–145)

## 2020-11-08 LAB — CBC
HCT: 40.7 % (ref 39.0–52.0)
Hemoglobin: 13.5 g/dL (ref 13.0–17.0)
MCH: 28.9 pg (ref 26.0–34.0)
MCHC: 33.2 g/dL (ref 30.0–36.0)
MCV: 87.2 fL (ref 80.0–100.0)
Platelets: 193 10*3/uL (ref 150–400)
RBC: 4.67 MIL/uL (ref 4.22–5.81)
RDW: 14.5 % (ref 11.5–15.5)
WBC: 5.6 10*3/uL (ref 4.0–10.5)
nRBC: 0 % (ref 0.0–0.2)

## 2020-11-08 LAB — BRAIN NATRIURETIC PEPTIDE: B Natriuretic Peptide: 85.3 pg/mL (ref 0.0–100.0)

## 2020-11-08 MED ORDER — POTASSIUM CHLORIDE CRYS ER 20 MEQ PO TBCR: EXTENDED_RELEASE_TABLET | ORAL | 3 refills | Status: DC

## 2020-11-08 MED ORDER — TORSEMIDE 20 MG PO TABS: ORAL_TABLET | ORAL | 6 refills | Status: DC

## 2020-11-08 NOTE — Patient Instructions (Signed)
INCREASE Torsemide to 40 mg twice a day for 2 days, then resume normal dose of 20 mg daily thereafter INCREASE Potassium to 60 meq daily for 2 days, the resume normal dose of 20 meq daily thereafter  Labs today We will only contact you if something comes back abnormal or we need to make some changes. Otherwise no news is good news!  Labs needed in 10 days  Your physician recommends that you schedule a follow-up appointment in: 2 months with Dr Haroldine Laws  Do the following things EVERYDAY: Weigh yourself in the morning before breakfast. Write it down and keep it in a log. Take your medicines as prescribed Eat low salt foods--Limit salt (sodium) to 2000 mg per day.  Stay as active as you can everyday Limit all fluids for the day to less than 2 liters  milAt the [facility], you and your health needs are our priority. As part of our continuing mission to provide you with exceptional heart care, we have created designated Provider Care Teams. These Care Teams include your primary Cardiologist (physician) and Advanced Practice Providers (APPs- Physician Assistants and Nurse Practitioners) who all work together to provide you with the care you need, when you need it.   You may see any of the following providers on your designated Care Team at your next follow up: Dr Glori Bickers Dr Loralie Champagne Dr Patrice Paradise, NP Lyda Jester, [place] Ginnie Smart Audry Riles, PharmD   Please be sure to bring in all your medications bottles to every appointment.   If you have any questions or concerns before your next appointment please send [place] a message through [place] or call our office at [phone].    TO LEAVE A MESSAGE FOR THE NURSE SELECT OPTION 2, PLEASE LEAVE A MESSAGE INCLUDING: YOUR NAME DATE OF BIRTH CALL BACK NUMBER REASON FOR CALL**this is important as we prioritize the call backs  YOU WILL RECEIVE A CALL BACK THE SAME DAY AS LONG AS YOU CALL  BEFORE 4:00 PM

## 2020-11-08 NOTE — Progress Notes (Signed)
ReDS Vest / Clip - 11/08/20 0900       ReDS Vest / Clip   Station Marker C    Ruler Value 26    ReDS Value Range High volume overload    ReDS Actual Value 50

## 2020-11-10 ENCOUNTER — Telehealth (HOSPITAL_COMMUNITY): Payer: Self-pay | Admitting: Cardiology

## 2020-11-10 NOTE — Telephone Encounter (Signed)
Pt aware   Kidney function is elevated and potassium is high. Stop K suppl for 2 days then may resume at reduced dose of 20 mEq daily. Reduce torsemide to 20 mg bid x 2 days only then back to 20 mg daily. Repeat labs on Monday scheduled.    Spoke with patient regarding labs and medication changes and he is agreeable. He will reduce K in diet as well.

## 2020-11-13 ENCOUNTER — Other Ambulatory Visit: Payer: Self-pay

## 2020-11-13 ENCOUNTER — Ambulatory Visit (HOSPITAL_COMMUNITY)
Admission: RE | Admit: 2020-11-13 | Discharge: 2020-11-13 | Disposition: A | Discharge status: Home / Self Care | Payer: Medicare Other | Source: Ambulatory Visit | Attending: Cardiology | Admitting: Cardiology

## 2020-11-13 DIAGNOSIS — I5032 Chronic diastolic (congestive) heart failure: Secondary | ICD-10-CM | POA: Diagnosis not present

## 2020-11-13 LAB — BASIC METABOLIC PANEL
Anion gap: 8 (ref 5–15)
BUN: 53 mg/dL — ABNORMAL HIGH (ref 8–23)
CO2: 27 mmol/L (ref 22–32)
Calcium: 9.5 mg/dL (ref 8.9–10.3)
Chloride: 98 mmol/L (ref 98–111)
Creatinine, Ser: 1.63 mg/dL — ABNORMAL HIGH (ref 0.61–1.24)
GFR, Estimated: 42 mL/min — ABNORMAL LOW (ref >60)
Glucose, Bld: 312 mg/dL — ABNORMAL HIGH (ref 70–99)
Potassium: 5.1 mmol/L (ref 3.5–5.1)
Sodium: 133 mmol/L — ABNORMAL LOW (ref 135–145)

## 2020-11-13 NOTE — Addendum Note (Signed)
Encounter addended by: Kerry Dory, CMA on: 11/13/2020 9:15 AM  Actions taken: Order Reconciliation Section accessed, Order list changed, Medication taking status modified, Medication List reviewed, Home Medications modified

## 2020-11-14 ENCOUNTER — Telehealth (HOSPITAL_COMMUNITY): Payer: Self-pay | Admitting: *Deleted

## 2020-11-14 DIAGNOSIS — I5022 Chronic systolic (congestive) heart failure: Secondary | ICD-10-CM

## 2020-11-14 NOTE — Telephone Encounter (Signed)
Pt called stating his [facility] Dr.Allen wants to pt to decrease Eliquis to 2.'5mg'$  bid. Pt said she based this decision off his age, A1C, how long he's been on eliquis and glucose. Pt said he did not want to decrease eliquis until Dr.Bensimhon agrees with plan.   Routed to [place] for advice

## 2020-11-15 ENCOUNTER — Encounter: Payer: Self-pay | Admitting: Internal Medicine

## 2020-11-15 ENCOUNTER — Ambulatory Visit (INDEPENDENT_AMBULATORY_CARE_PROVIDER_SITE_OTHER): Payer: Medicare Other | Admitting: Internal Medicine

## 2020-11-15 ENCOUNTER — Other Ambulatory Visit: Payer: Self-pay

## 2020-11-15 DIAGNOSIS — R490 Dysphonia: Secondary | ICD-10-CM | POA: Diagnosis not present

## 2020-11-15 DIAGNOSIS — T887XXA Unspecified adverse effect of drug or medicament, initial encounter: Secondary | ICD-10-CM

## 2020-11-15 DIAGNOSIS — E669 Obesity, unspecified: Secondary | ICD-10-CM

## 2020-11-15 DIAGNOSIS — E1169 Type 2 diabetes mellitus with other specified complication: Secondary | ICD-10-CM | POA: Diagnosis not present

## 2020-11-15 DIAGNOSIS — I482 Chronic atrial fibrillation, unspecified: Secondary | ICD-10-CM | POA: Diagnosis not present

## 2020-11-15 MED ORDER — LORATADINE 10 MG PO TABS: 10.0000 mg | ORAL_TABLET | Freq: Every day | ORAL | 3 refills | Status: DC

## 2020-11-15 NOTE — Assessment & Plan Note (Signed)
On Eliquis bid

## 2020-11-15 NOTE — Assessment & Plan Note (Signed)
ST and hoarseness x 2 years off and on - poss due to meds Treat GERD, allergies - Pepcid, Claritin ENT ref

## 2020-11-15 NOTE — Progress Notes (Signed)
Subjective:  Patient ID: [name] White, male    DOB: [birth year]  Age: 85 y.o. MRN: [id number]  CC: Hoarse (Pt states he been having the hoarseness but it is getting worse)   HPI [name] White presents for ST and hoarseness x 2 years off and on. [name] White is worried about cancer... F/u on A fib, DM  Outpatient Medications Prior to Visit  Medication Sig Dispense Refill   apixaban (ELIQUIS) 5 MG TABS tablet Take 1 tablet (5 mg total) by mouth 2 (two) times daily. 180 tablet 3   brimonidine (ALPHAGAN) 0.15 % ophthalmic solution      Brinzolamide-Brimonidine (SIMBRINZA) 1-0.2 % SUSP Apply to eye.     CINNAMON PO Take 2,000 mg by mouth 2 (two) times daily.      clobetasol (TEMOVATE) 0.05 % external solution Apply 1 application topically. Applies 3 times a day for bumps in scalp.     cloNIDine (CATAPRES) 0.1 MG tablet TAKE 1 TABLET AS NEEDED FOR SYSTOLIC BLOOD PRESSURE GREATER THAN 160 90 tablet 1   Coenzyme Q10 ([facility] COQ-10) 200 MG capsule Take 200 mg by mouth every evening.      ergocalciferol (VITAMIN D2) 1.25 MG (50000 UT) capsule Take 50,000 Units by mouth every Monday.     famotidine (PEPCID) 20 MG tablet Take 20 mg by mouth 2 (two) times daily.     ferrous sulfate 325 (65 FE) MG tablet Take 325 mg by mouth every evening.      finasteride (PROSCAR) 5 MG tablet Take 5 mg by mouth every evening.     levothyroxine (SYNTHROID) 200 MCG tablet Take 200 mcg by mouth daily before breakfast.     metFORMIN (GLUCOPHAGE) 500 MG tablet Take 500 mg by mouth 2 (two) times daily with a meal.     nilotinib (TASIGNA) 150 MG capsule Take 300 mg by mouth 2 (two) times daily. Take on an empty stomach, 1 hr before or 2 hrs after food.     PARoxetine (PAXIL) 40 MG tablet Take 1 tablet (40 mg total) by mouth every morning. 90 tablet 1   potassium chloride SA (KLOR-CON M20) 20 MEQ tablet Take 3 tablets (60 mEq total) by mouth daily for 3 days, THEN 1 tablet (20 mEq total) daily. 34 tablet 3   rosuvastatin  (CRESTOR) 10 MG tablet Take 1 tablet (10 mg total) by mouth daily. Please schedule an appointment for further refills 30 tablet 0   spironolactone (ALDACTONE) 25 MG tablet Take 12.5 mg by mouth daily.     torsemide (DEMADEX) 20 MG tablet Take 2 tablets (40 mg total) by mouth 2 (two) times daily for 2 days, THEN 1 tablet (20 mg total) daily. 38 tablet 6   traZODone (DESYREL) 50 MG tablet Take 50 mg by mouth at bedtime.     TURMERIC PO Take 1,000 mg by mouth every evening.      amoxicillin (AMOXIL) 500 MG tablet Take 4 capsules (2,000 mg) one hour prior to all dental visits. (Patient not taking: No sig reported) 8 tablet 11   No facility-administered medications prior to visit.    ROS: Review of Systems  Constitutional:  Positive for fatigue. Negative for appetite change and unexpected weight change.  HENT:  Positive for postnasal drip and voice change. Negative for congestion, nosebleeds, sneezing, sore throat and trouble swallowing.   Eyes:  Negative for itching and visual disturbance.  Respiratory:  Negative for cough.   Cardiovascular:  Negative for chest pain,  palpitations and leg swelling.  Gastrointestinal:  Negative for abdominal distention, blood in stool, diarrhea and nausea.  Genitourinary:  Negative for frequency and hematuria.  Musculoskeletal:  Negative for back pain, gait problem, joint swelling and neck pain.  Skin:  Negative for rash.  Neurological:  Negative for dizziness, tremors, speech difficulty and weakness.  Psychiatric/Behavioral:  Negative for agitation, dysphoric mood and sleep disturbance. The patient is not nervous/anxious.    Objective:  BP 118/70 (BP Location: Left Arm)   Pulse 90   Temp 98.1 F (36.7 C) (Oral)   SpO2 98%   BP Readings from Last 3 Encounters:  11/15/20 118/70  11/08/20 110/70  05/15/20 120/82    Wt Readings from Last 3 Encounters:  11/08/20 214 lb 3.2 oz (97.2 kg)  04/12/20 233 lb 12.8 oz (106.1 kg)  02/23/20 232 lb 12.8 oz (105.6  kg)    Physical Exam Constitutional:      General: He is not in acute distress.    Appearance: He is well-developed.     Comments: NAD  Eyes:     Conjunctiva/sclera: Conjunctivae normal.     Pupils: Pupils are equal, round, and reactive to light.  Neck:     Thyroid: No thyromegaly.     Vascular: No JVD.  Cardiovascular:     Rate and Rhythm: Normal rate. Rhythm irregular.     Heart sounds: Normal heart sounds. No murmur heard.   No friction rub. No gallop.  Pulmonary:     Effort: Pulmonary effort is normal. No respiratory distress.     Breath sounds: Normal breath sounds. No wheezing or rales.  Chest:     Chest wall: No tenderness.  Abdominal:     General: Bowel sounds are normal. There is no distension.     Palpations: Abdomen is soft. There is no mass.     Tenderness: There is no abdominal tenderness. There is no guarding or rebound.  Musculoskeletal:        General: No tenderness. Normal range of motion.     Cervical back: Normal range of motion.  Lymphadenopathy:     Cervical: No cervical adenopathy.  Skin:    General: Skin is warm and dry.     Findings: No rash.  Neurological:     Mental Status: He is alert and oriented to person, place, and time.     Cranial Nerves: No cranial nerve deficit.     Motor: No abnormal muscle tone.     Coordination: Coordination normal.     Gait: Gait normal.     Deep Tendon Reflexes: Reflexes are normal and symmetric.  Psychiatric:        Behavior: Behavior normal.        Thought Content: Thought content normal.        Judgment: Judgment normal.   Stiff gait, cane  Lab Results  Component Value Date   WBC 5.6 11/08/2020   HGB 13.5 11/08/2020   HCT 40.7 11/08/2020   PLT 193 11/08/2020   GLUCOSE 312 (H) 11/13/2020   CHOL 171 01/22/2018   TRIG 98 01/22/2018   HDL 57 01/22/2018   LDLDIRECT 141.6 11/12/2010   LDLCALC 94 01/22/2018   ALT 22 07/14/2019   AST 40 (H) 07/14/2019   NA 133 (L) 11/13/2020   K 5.1 11/13/2020   CL 98  11/13/2020   CREATININE 1.63 (H) 11/13/2020   BUN 53 (H) 11/13/2020   CO2 27 11/13/2020   TSH 1.25 07/14/2019   PSA 1.42 09/13/2013  INR 1.1 04/09/2019   HGBA1C 6.3 07/14/2019   MICROALBUR 6.0 (H) 01/10/2017    No results found.  Assessment & Plan:    Walker Kehr, MD

## 2020-11-15 NOTE — Assessment & Plan Note (Signed)
ST and hoarseness x 2 years off and on - poss due to meds ENT ref

## 2020-11-15 NOTE — Telephone Encounter (Signed)
Called pt and his wife said he was at a doctors appt and asked me to call at 4pm.

## 2020-11-15 NOTE — Assessment & Plan Note (Signed)
On Metformin per [facility] Endocrinology. CBGs 170-180

## 2020-11-16 MED ORDER — APIXABAN 2.5 MG PO TABS: 2.5000 mg | ORAL_TABLET | Freq: Two times a day (BID) | ORAL | 3 refills | Status: DC

## 2020-11-16 NOTE — Telephone Encounter (Signed)
Spoke with pt he is aware and thanked me for the call.

## 2020-11-17 ENCOUNTER — Other Ambulatory Visit: Payer: Self-pay

## 2020-11-17 ENCOUNTER — Ambulatory Visit (HOSPITAL_COMMUNITY)
Admission: RE | Admit: 2020-11-17 | Discharge: 2020-11-17 | Disposition: A | Discharge status: Home / Self Care | Payer: Medicare Other | Source: Ambulatory Visit | Attending: Internal Medicine | Admitting: Internal Medicine

## 2020-11-17 DIAGNOSIS — I5032 Chronic diastolic (congestive) heart failure: Secondary | ICD-10-CM | POA: Diagnosis not present

## 2020-11-17 LAB — BASIC METABOLIC PANEL
Anion gap: 8 (ref 5–15)
BUN: 44 mg/dL — ABNORMAL HIGH (ref 8–23)
CO2: 24 mmol/L (ref 22–32)
Calcium: 9.4 mg/dL (ref 8.9–10.3)
Chloride: 100 mmol/L (ref 98–111)
Creatinine, Ser: 1.55 mg/dL — ABNORMAL HIGH (ref 0.61–1.24)
GFR, Estimated: 44 mL/min — ABNORMAL LOW (ref >60)
Glucose, Bld: 220 mg/dL — ABNORMAL HIGH (ref 70–99)
Potassium: 4.9 mmol/L (ref 3.5–5.1)
Sodium: 132 mmol/L — ABNORMAL LOW (ref 135–145)

## 2020-12-19 DIAGNOSIS — Z09 Encounter for follow-up examination after completed treatment for conditions other than malignant neoplasm: Secondary | ICD-10-CM | POA: Diagnosis not present

## 2020-12-19 DIAGNOSIS — D225 Melanocytic nevi of trunk: Secondary | ICD-10-CM | POA: Diagnosis not present

## 2020-12-19 DIAGNOSIS — R208 Other disturbances of skin sensation: Secondary | ICD-10-CM | POA: Diagnosis not present

## 2020-12-19 DIAGNOSIS — L538 Other specified erythematous conditions: Secondary | ICD-10-CM | POA: Diagnosis not present

## 2020-12-19 DIAGNOSIS — L570 Actinic keratosis: Secondary | ICD-10-CM | POA: Diagnosis not present

## 2020-12-19 DIAGNOSIS — L720 Epidermal cyst: Secondary | ICD-10-CM | POA: Diagnosis not present

## 2020-12-19 DIAGNOSIS — L821 Other seborrheic keratosis: Secondary | ICD-10-CM | POA: Diagnosis not present

## 2020-12-19 DIAGNOSIS — L02821 Furuncle of head [any part, except face]: Secondary | ICD-10-CM | POA: Diagnosis not present

## 2020-12-19 DIAGNOSIS — D485 Neoplasm of uncertain behavior of skin: Secondary | ICD-10-CM | POA: Diagnosis not present

## 2020-12-19 DIAGNOSIS — L820 Inflamed seborrheic keratosis: Secondary | ICD-10-CM | POA: Diagnosis not present

## 2020-12-19 DIAGNOSIS — L814 Other melanin hyperpigmentation: Secondary | ICD-10-CM | POA: Diagnosis not present

## 2020-12-19 DIAGNOSIS — Z872 Personal history of diseases of the skin and subcutaneous tissue: Secondary | ICD-10-CM | POA: Diagnosis not present

## 2020-12-19 DIAGNOSIS — L738 Other specified follicular disorders: Secondary | ICD-10-CM | POA: Diagnosis not present

## 2020-12-25 ENCOUNTER — Other Ambulatory Visit: Payer: Self-pay

## 2020-12-25 ENCOUNTER — Ambulatory Visit (INDEPENDENT_AMBULATORY_CARE_PROVIDER_SITE_OTHER): Payer: Medicare Other | Admitting: Otolaryngology

## 2020-12-25 DIAGNOSIS — R490 Dysphonia: Secondary | ICD-10-CM

## 2020-12-25 NOTE — Progress Notes (Signed)
HPI: [name] White is a 85 y.o. male who presents is referred by his PCP Dr. Alain Marion for evaluation of hoarseness.  Patient has history of leukemia and since taking some new medication he felt like his voice has changed.[name]  He states that his voice comes and goes.  He does not feel like he has his normal voice although on conversation in the office today he has minimal hoarseness. He was prescribed loratadine which seemed to help some.  Past Medical History:  Diagnosis Date   Anemia 06/04/2015   Anxiety    Atrial flutter ([place])    s/p ablation 07/17/08   Cancer ([place])    leukemia - dx 05/2018   Chronic diastolic CHF (congestive heart failure) ([place])    Coronary artery disease    Depression    Diabetes mellitus type 2 in obese ([place]) 04/10/2009   Qualifier: Diagnosis of  By: Arnoldo Morale MD, Balinda Quails, diet controlled, lost 60lbs   Emphysema of lung ([place])    GERD (gastroesophageal reflux disease)    Glaucoma    recent dx - has an appt to be evaluated and meds to treat   H/O hiatal hernia    Hearing loss    some hearing loss in left ear but has not been dx, no hearing aids   Hepatitis    h/o of type A   History of measles as a child    History of mumps as a child    Hypertension    Hypothyroidism    Micturition syncope    Osteoarthritis    Pleural effusion, bilateral    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   26 mm Edwards Sapien 3 transcatheter heart valve placed via percutaneous right transfemoral approach    Severe aortic stenosis    Sleep apnea    uses CPAP nightly   Vitamin D deficiency 11/24/2015   Past Surgical History:  Procedure Laterality Date   CARDIAC CATHETERIZATION  03/18/2019   CARDIAC ELECTROPHYSIOLOGY MAPPING AND ABLATION  06/2008   CATARACT EXTRACTION W/ INTRAOCULAR LENS  IMPLANT, BILATERAL  ~ 2007   COLONOSCOPY     FOOT TENDON SURGERY Left    INTRAOPERATIVE TRANSTHORACIC ECHOCARDIOGRAM N/A 04/13/2019   Procedure: TRANSTHORACIC ECHOCARDIOGRAM;   Surgeon: Sherren Mocha, MD;  Location: [facility];  Service: Open Heart Surgery;  Laterality: N/A;   RIGHT/LEFT HEART CATH AND CORONARY ANGIOGRAPHY N/A 03/18/2019   Procedure: RIGHT/LEFT HEART CATH AND CORONARY ANGIOGRAPHY;  Surgeon: Jolaine Artist, MD;  Location: [facility];  Service: Cardiovascular;  Laterality: N/A;   TONSILLECTOMY  1940's   TOTAL KNEE ARTHROPLASTY  1990's   right   TOTAL KNEE ARTHROPLASTY Left 08/02/2013   Procedure: LEFT TOTAL KNEE ARTHROPLASTY;  Surgeon: Gearlean Alf, MD;  Location: [facility];  Service: Orthopedics;  Laterality: Left;   TRANSCATHETER AORTIC VALVE REPLACEMENT, TRANSFEMORAL N/A 04/13/2019   Procedure: TRANSCATHETER AORTIC VALVE REPLACEMENT, TRANSFEMORAL;  Surgeon: Sherren Mocha, MD;  Location: [facility];  Service: Open Heart Surgery;  Laterality: N/A;   UPPER GI ENDOSCOPY     WISDOM TOOTH EXTRACTION     Social History   Socioeconomic History   Marital status: Married    Spouse name: Not on file   Number of children: Not on file   Years of education: Not on file   Highest education level: Not on file  Occupational History   Not on file  Tobacco Use   Smoking status: Former    Types: Pipe, Landscape architect  Quit date: 04/01/1996    Years since quitting: 24.7   Smokeless tobacco: Former    Types: Chew    Quit date: 04/01/2009  Vaping Use   Vaping Use: Never used  Substance and Sexual Activity   Alcohol use: Not Currently    Alcohol/week: 0.0 standard drinks    Comment: Quit wine early 2020.   Drug use: No   Sexual activity: Not Currently    Comment: lives with wife, eats clean diet. No major dietary restrtictions,   Other Topics Concern   Not on file  Social History Narrative   Not on file   Social Determinants of Health   Financial Resource Strain: Not on file  Food Insecurity: Not on file  Transportation Needs: Not on file  Physical Activity: Not on file  Stress: Not on file  Social Connections: Not on file   Family History  Problem  Relation Age of Onset   Multiple sclerosis Father    Heart disease Mother        atrial fibrillation   Hypertension Son    Heart disease Maternal Grandfather    Allergies  Allergen Reactions   Ace Inhibitors Anaphylaxis and Swelling    Face and lip swelling   Benazepril Anaphylaxis and Swelling    Face and lip swelling   Codeine Itching and Rash   Statins Other (See Comments)    Made very sick, doctor thought had heart attack; "my appearance, color, etc". Tolerating a low dose currently   Piroxicam Hives   Pseudoephedrine Other (See Comments)    Can't take because it interacts with dofetilide    Dasatinib     Hoarsness, weakness, scrotal swelling   Betadine [Povidone Iodine] Hives and Rash   Prior to Admission medications   Medication Sig Start Date End Date Taking? Authorizing Provider  amoxicillin (AMOXIL) 500 MG tablet Take 4 capsules (2,000 mg) one hour prior to all dental visits. Patient not taking: No sig reported 04/21/19   Eileen Stanford, PA-C  apixaban (ELIQUIS) 2.5 MG TABS tablet Take 1 tablet (2.5 mg total) by mouth 2 (two) times daily. 11/16/20   Bensimhon, Shaune Pascal, MD  brimonidine (ALPHAGAN) 0.15 % ophthalmic solution  04/06/20   [provider]  Brinzolamide-Brimonidine [facility]) 1-0.2 % SUSP Apply to eye.    [provider]  CINNAMON PO Take 2,000 mg by mouth 2 (two) times daily.     [provider]  clobetasol (TEMOVATE) 0.05 % external solution Apply 1 application topically. Applies 3 times a day for bumps in scalp.    [provider]  cloNIDine (CATAPRES) 0.1 MG tablet TAKE 1 TABLET AS NEEDED FOR SYSTOLIC BLOOD PRESSURE GREATER THAN 160 09/01/19   Bensimhon, Shaune Pascal, MD  Coenzyme Q10 ([facility] COQ-10) 200 MG capsule Take 200 mg by mouth every evening.     [provider]  ergocalciferol (VITAMIN D2) 1.25 MG (50000 UT) capsule Take 50,000 Units by mouth every Monday.    [provider]  famotidine (PEPCID) 20 MG  tablet Take 20 mg by mouth 2 (two) times daily.    [provider]  ferrous sulfate 325 (65 FE) MG tablet Take 325 mg by mouth every evening.     [provider]  finasteride (PROSCAR) 5 MG tablet Take 5 mg by mouth every evening.    [provider]  levothyroxine (SYNTHROID) 200 MCG tablet Take 200 mcg by mouth daily before breakfast.    [provider]  loratadine (CLARITIN) 10  MG tablet Take 1 tablet (10 mg total) by mouth daily. 11/15/20   Plotnikov, Evie Lacks, MD  metFORMIN (GLUCOPHAGE) 500 MG tablet Take 500 mg by mouth 2 (two) times daily with a meal.    [provider]  nilotinib (TASIGNA) 150 MG capsule Take 300 mg by mouth 2 (two) times daily. Take on an empty stomach, 1 hr before or 2 hrs after food.    [provider]  PARoxetine (PAXIL) 40 MG tablet Take 1 tablet (40 mg total) by mouth every morning. 09/14/13   Ricard Dillon, MD  potassium chloride SA (KLOR-CON M20) 20 MEQ tablet Take 3 tablets (60 mEq total) by mouth daily for 3 days, THEN 1 tablet (20 mEq total) daily. 11/08/20 12/11/20  Rafael Bihari, FNP  rosuvastatin (CRESTOR) 10 MG tablet Take 1 tablet (10 mg total) by mouth daily. Please schedule an appointment for further refills 11/01/20   Bensimhon, Shaune Pascal, MD  spironolactone (ALDACTONE) 25 MG tablet Take 12.5 mg by mouth daily.    [provider]  torsemide (DEMADEX) 20 MG tablet Take 2 tablets (40 mg total) by mouth 2 (two) times daily for 2 days, THEN 1 tablet (20 mg total) daily. 11/08/20 12/10/20  Rafael Bihari, FNP  traZODone (DESYREL) 50 MG tablet Take 50 mg by mouth at bedtime.    [provider]  TURMERIC PO Take 1,000 mg by mouth every evening.     [provider]     Positive ROS: Otherwise negative  All other systems have been reviewed and were otherwise negative with the exception of those mentioned in the HPI and as above.  Physical Exam: Constitutional: Alert,  well-appearing, no acute distress.  He is having no airway problems and no significant hoarseness in the office today. Ears: External ears without lesions or tenderness. Ear canals are clear bilaterally with intact, clear TMs.  Nasal: External nose without lesions. Septum with minimal deformity and mild rhinitis.  Both middle meatus regions were clear..  Oral: Lips and gums without lesions. Tongue and palate mucosa without lesions. Posterior oropharynx clear. Fiberoptic laryngoscopy was performed to the left nostril.  The nasopharynx was clear.  The base of tongue vallecula and epiglottis were normal.  On evaluation the vocal cords vocal cords were clear bilaterally with normal vocal mobility bilaterally with no vocal cord lesions noted.  Of note he did have a small amount of thick white mucus but this cleared easily with coughing. Neck: No palpable adenopathy or masses. Respiratory: Breathing comfortably  Skin: No facial/neck lesions or rash noted.  Laryngoscopy  Date/Time: 12/25/2020 4:00 PM Performed by: Rozetta Nunnery, MD Authorized by: Rozetta Nunnery, MD   Consent:    Consent obtained:  Verbal   Consent given by:  Patient Procedure details:    Indications: hoarseness, dysphagia, or aspiration     Medication:  Afrin   Instrument: flexible fiberoptic laryngoscope     Scope location: left nare   Sinus:    Left middle meatus: normal     Left nasopharynx: normal   Mouth:    Oropharynx: normal     Vallecula: normal     Base of tongue: normal     Epiglottis: normal   Throat:    Pyriform sinus: normal     True vocal cords: normal   Comments:     On fiberoptic laryngoscopy vocal cords were clear bilaterally with normal vocal mobility.  No significant vocal lesions noted.     Assessment  History of hoarseness  Plan: Clear vocal cord examination on fiberoptic laryngoscopy. Reassured patient of normal vocal cord examination.  I suspect intermittent hoarseness may be  related to overall weakness as well as mucus production and suggested using the loratadine as well as perhaps try Mucinex.  Also recommended drinking plenty of liquids as this will be beneficial for clearing the mucus from the throat. He will follow-up as needed.   Radene Journey, MD   CC:

## 2021-01-17 ENCOUNTER — Encounter (HOSPITAL_COMMUNITY): Payer: Self-pay | Admitting: Internal Medicine

## 2021-01-17 ENCOUNTER — Ambulatory Visit (HOSPITAL_BASED_OUTPATIENT_CLINIC_OR_DEPARTMENT_OTHER)
Admission: RE | Admit: 2021-01-17 | Discharge: 2021-01-17 | Disposition: A | Discharge status: Home / Self Care | Payer: Medicare Other | Source: Ambulatory Visit | Attending: Internal Medicine | Admitting: Internal Medicine

## 2021-01-17 ENCOUNTER — Other Ambulatory Visit: Payer: Self-pay

## 2021-01-17 ENCOUNTER — Ambulatory Visit (HOSPITAL_COMMUNITY)
Admission: RE | Admit: 2021-01-17 | Discharge: 2021-01-17 | Disposition: A | Discharge status: Home / Self Care | Payer: Medicare Other | Source: Ambulatory Visit | Attending: Internal Medicine | Admitting: Internal Medicine

## 2021-01-17 VITALS — BP 122/80 | HR 86 | Wt 216.6 lb

## 2021-01-17 DIAGNOSIS — I5032 Chronic diastolic (congestive) heart failure: Secondary | ICD-10-CM | POA: Diagnosis not present

## 2021-01-17 DIAGNOSIS — E669 Obesity, unspecified: Secondary | ICD-10-CM | POA: Insufficient documentation

## 2021-01-17 DIAGNOSIS — E785 Hyperlipidemia, unspecified: Secondary | ICD-10-CM | POA: Insufficient documentation

## 2021-01-17 DIAGNOSIS — E1122 Type 2 diabetes mellitus with diabetic chronic kidney disease: Secondary | ICD-10-CM | POA: Insufficient documentation

## 2021-01-17 DIAGNOSIS — Z79899 Other long term (current) drug therapy: Secondary | ICD-10-CM | POA: Diagnosis not present

## 2021-01-17 DIAGNOSIS — G4730 Sleep apnea, unspecified: Secondary | ICD-10-CM | POA: Diagnosis not present

## 2021-01-17 DIAGNOSIS — I4821 Permanent atrial fibrillation: Secondary | ICD-10-CM | POA: Insufficient documentation

## 2021-01-17 DIAGNOSIS — I350 Nonrheumatic aortic (valve) stenosis: Secondary | ICD-10-CM

## 2021-01-17 DIAGNOSIS — Z8249 Family history of ischemic heart disease and other diseases of the circulatory system: Secondary | ICD-10-CM | POA: Insufficient documentation

## 2021-01-17 DIAGNOSIS — I130 Hypertensive heart and chronic kidney disease with heart failure and stage 1 through stage 4 chronic kidney disease, or unspecified chronic kidney disease: Secondary | ICD-10-CM | POA: Insufficient documentation

## 2021-01-17 DIAGNOSIS — Z952 Presence of prosthetic heart valve: Secondary | ICD-10-CM

## 2021-01-17 DIAGNOSIS — Z888 Allergy status to other drugs, medicaments and biological substances status: Secondary | ICD-10-CM | POA: Insufficient documentation

## 2021-01-17 DIAGNOSIS — I482 Chronic atrial fibrillation, unspecified: Secondary | ICD-10-CM | POA: Diagnosis not present

## 2021-01-17 DIAGNOSIS — M25571 Pain in right ankle and joints of right foot: Secondary | ICD-10-CM | POA: Insufficient documentation

## 2021-01-17 DIAGNOSIS — I6523 Occlusion and stenosis of bilateral carotid arteries: Secondary | ICD-10-CM | POA: Diagnosis not present

## 2021-01-17 DIAGNOSIS — C9210 Chronic myeloid leukemia, BCR/ABL-positive, not having achieved remission: Secondary | ICD-10-CM | POA: Diagnosis not present

## 2021-01-17 DIAGNOSIS — I2510 Atherosclerotic heart disease of native coronary artery without angina pectoris: Secondary | ICD-10-CM | POA: Insufficient documentation

## 2021-01-17 DIAGNOSIS — N1832 Chronic kidney disease, stage 3b: Secondary | ICD-10-CM | POA: Diagnosis not present

## 2021-01-17 DIAGNOSIS — Z7984 Long term (current) use of oral hypoglycemic drugs: Secondary | ICD-10-CM | POA: Diagnosis not present

## 2021-01-17 DIAGNOSIS — Z7901 Long term (current) use of anticoagulants: Secondary | ICD-10-CM | POA: Insufficient documentation

## 2021-01-17 DIAGNOSIS — Z87891 Personal history of nicotine dependence: Secondary | ICD-10-CM | POA: Insufficient documentation

## 2021-01-17 LAB — ECHOCARDIOGRAM COMPLETE
AR max vel: 1.8 cm2
AV Area VTI: 1.49 cm2
AV Area mean vel: 1.48 cm2
AV Mean grad: 14 mmHg
AV Peak grad: 24 mmHg
Ao pk vel: 2.45 m/s
Area-P 1/2: 4.49 cm2
S' Lateral: 1.7 cm

## 2021-01-17 NOTE — Progress Notes (Signed)
Advanced Heart Failure Note   Date:  01/17/2021   ID:  [name] White, DOB 1936-03-23, MRN [id number]  Location: Home  Provider location: [facility] Type of Visit: Established patient  PCP:  Plotnikov, Evie Lacks, MD  Cardiologist:  None Primary HF: [name] White  Chief Complaint: Heart Failure follow-up   History of Present Illness:   HPI: [name] White is a delightful 85 year old male with a history of non-obstructive CAD, micturition syncope, hypertension, hyperlipidemia,borderline diabetes, obesity, sleep apnea, atrial flutter s/p a. flutter ablation by Dr. Lovena Le 06/2008, PAF, CML and severe AS s/p TAVR 04/13/19.    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 Being treated at [facility]. Has lost over 40 pounds. BP down so clonidine stopped.    Echo 10/20 EF 55-60% RV ok. Severe AS mean gradient 28mmHG Small effusion. Mild to mod MR/TR. S/p TAVR on 04/13/19.    Echo 05/17/19 EF 60-65%. TAVR ok. RV ok. Mod TR  Personally reviewed  He presents for routine f/u. Has been struggling with severe R ankle pain. Seen at [facility] and also felt to have some cellulitis. Otherwise doing well. No CP, SOB orthopnea or PND.   Echo today 01/17/21: EF 60-65% TAVR stable. Severe biatrial enlargement. Personally reviewed   Cardiac studies:   R/L cath 12/20   Prox RCA to Mid RCA lesion is 30% stenosed. RPAV lesion is 30% stenosed. Ost Cx to Prox Cx lesion is 50% stenosed. Prox LAD to Mid LAD lesion is 20% stenosed.   Findings:   Ao = 82/51(65)  Lv = 138/8   RA = 5 RV = 38/6 PA = 37/13 (22) PCW = 14  Fick cardiac output/index = 9.8/4.3 PVR = 0.7 WU FA sat = 96% PA sat = 78%, 77% High SVC 75%   AoV = peak gradient 35mm HG, mean gradient 36 mmHG, AVA 1.4 cm2    Past Medical History:  Diagnosis Date   Anemia 06/04/2015   Anxiety    Atrial flutter ([place])    s/p ablation 07/17/08   Cancer ([place])    leukemia - dx 05/2018   Chronic diastolic CHF (congestive heart failure)  ([place])    Coronary artery disease    Depression    Diabetes mellitus type 2 in obese ([place]) 04/10/2009   Qualifier: Diagnosis of  By: Arnoldo Morale MD, Balinda Quails, diet controlled, lost 60lbs   Emphysema of lung (HCC)    GERD (gastroesophageal reflux disease)    Glaucoma    recent dx - has an appt to be evaluated and meds to treat   H/O hiatal hernia    Hearing loss    some hearing loss in left ear but has not been dx, no hearing aids   Hepatitis    h/o of type A   History of measles as a child    History of mumps as a child    Hypertension    Hypothyroidism    Micturition syncope    Osteoarthritis    Pleural effusion, bilateral    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   26 mm Edwards Sapien 3 transcatheter heart valve placed via percutaneous right transfemoral approach    Severe aortic stenosis    Sleep apnea    uses CPAP nightly   Vitamin D deficiency 11/24/2015   Past Surgical History:  Procedure Laterality Date   CARDIAC CATHETERIZATION  03/18/2019   CARDIAC ELECTROPHYSIOLOGY MAPPING AND ABLATION  06/2008   CATARACT EXTRACTION W/ INTRAOCULAR LENS  IMPLANT, BILATERAL  ~ 2007   COLONOSCOPY     FOOT TENDON SURGERY Left    INTRAOPERATIVE TRANSTHORACIC ECHOCARDIOGRAM N/A 04/13/2019   Procedure: TRANSTHORACIC ECHOCARDIOGRAM;  Surgeon: Sherren Mocha, MD;  Location: [facility];  Service: Open Heart Surgery;  Laterality: N/A;   RIGHT/LEFT HEART CATH AND CORONARY ANGIOGRAPHY N/A 03/18/2019   Procedure: RIGHT/LEFT HEART CATH AND CORONARY ANGIOGRAPHY;  Surgeon: Jolaine Artist, MD;  Location: [facility];  Service: Cardiovascular;  Laterality: N/A;   TONSILLECTOMY  1940's   TOTAL KNEE ARTHROPLASTY  1990's   right   TOTAL KNEE ARTHROPLASTY Left 08/02/2013   Procedure: LEFT TOTAL KNEE ARTHROPLASTY;  Surgeon: Gearlean Alf, MD;  Location: [facility];  Service: Orthopedics;  Laterality: Left;   TRANSCATHETER AORTIC VALVE REPLACEMENT, TRANSFEMORAL N/A 04/13/2019   Procedure:  TRANSCATHETER AORTIC VALVE REPLACEMENT, TRANSFEMORAL;  Surgeon: Sherren Mocha, MD;  Location: [facility];  Service: Open Heart Surgery;  Laterality: N/A;   UPPER GI ENDOSCOPY     WISDOM TOOTH EXTRACTION       Current Outpatient Medications  Medication Sig Dispense Refill   apixaban (ELIQUIS) 2.5 MG TABS tablet Take 1 tablet (2.5 mg total) by mouth 2 (two) times daily. 180 tablet 3   Brinzolamide-Brimonidine (SIMBRINZA) 1-0.2 % SUSP Apply to eye.     CINNAMON PO Take 2,000 mg by mouth 2 (two) times daily.      clobetasol (TEMOVATE) 0.05 % external solution Apply 1 application topically. Applies 3 times a day for bumps in scalp.     Coenzyme Q10 ([employer] COQ-10) 200 MG capsule Take 200 mg by mouth every evening.      ergocalciferol (VITAMIN D2) 1.25 MG (50000 UT) capsule Take 50,000 Units by mouth every Monday.     famotidine (PEPCID) 20 MG tablet Take 20 mg by mouth 2 (two) times daily.     ferrous sulfate 325 (65 FE) MG tablet Take 325 mg by mouth every evening.      finasteride (PROSCAR) 5 MG tablet Take 5 mg by mouth every evening.     levothyroxine (SYNTHROID) 200 MCG tablet Take 200 mcg by mouth daily before breakfast.     loratadine (CLARITIN) 10 MG tablet Take 1 tablet (10 mg total) by mouth daily. 90 tablet 3   metFORMIN (GLUCOPHAGE) 500 MG tablet Take 500 mg by mouth 2 (two) times daily with a meal.     nilotinib (TASIGNA) 150 MG capsule Take 300 mg by mouth 2 (two) times daily. Take on an empty stomach, 1 hr before or 2 hrs after food.     PARoxetine (PAXIL) 40 MG tablet Take 1 tablet (40 mg total) by mouth every morning. 90 tablet 1   potassium chloride SA (KLOR-CON M20) 20 MEQ tablet Take 3 tablets (60 mEq total) by mouth daily for 3 days, THEN 1 tablet (20 mEq total) daily. 34 tablet 3   rosuvastatin (CRESTOR) 10 MG tablet Take 1 tablet (10 mg total) by mouth daily. Please schedule an appointment for further refills 30 tablet 0   spironolactone (ALDACTONE) 25 MG tablet Take 12.5 mg by  mouth daily.     traZODone (DESYREL) 50 MG tablet Take 50 mg by mouth at bedtime.     TURMERIC PO Take 1,000 mg by mouth every evening.      amoxicillin (AMOXIL) 500 MG tablet Take 4 capsules (2,000 mg) one hour prior to all dental visits. (Patient not taking: No sig reported) 8 tablet 11   cloNIDine (CATAPRES) 0.1 MG  tablet TAKE 1 TABLET AS NEEDED FOR SYSTOLIC BLOOD PRESSURE GREATER THAN 160 (Patient not taking: Reported on 01/17/2021) 90 tablet 1   torsemide (DEMADEX) 20 MG tablet Take 2 tablets (40 mg total) by mouth 2 (two) times daily for 2 days, THEN 1 tablet (20 mg total) daily. 38 tablet 6   No current facility-administered medications for this encounter.    Allergies:   Ace inhibitors, Benazepril, Codeine, Statins, Piroxicam, Pseudoephedrine, Dasatinib, and Betadine [povidone iodine]   Social History:  The patient  reports that he quit smoking about 24 years ago. His smoking use included pipe and cigars. He quit smokeless tobacco use about 11 years ago.  His smokeless tobacco use included chew. He reports that he does not currently use alcohol. He reports that he does not use drugs.   Family History:  The patient's family history includes Heart disease in his maternal grandfather and mother; Hypertension in his son; Multiple sclerosis in his father.   ROS:  Please see the history of present illness.   All other systems are personally reviewed and negative.   Vitals:   01/17/21 1514  BP: 122/80  Pulse: (!) 102  SpO2: 100%      Exam:  General:  Well appearing. No resp difficulty HEENT: normal Neck: supple. no JVD. Carotids 2+ bilat; no bruits. No lymphadenopathy or thryomegaly appreciated. Cor: PMI nondisplaced. Irregular rate & rhythm. 2/6 SEM RSB Lungs: clear Abdomen: soft, nontender, nondistended. No hepatosplenomegaly. No bruits or masses. Good bowel sounds. Extremities: no cyanosis, clubbing, rash, edema Neuro: alert & orientedx3, cranial nerves grossly intact. moves  all 4 extremities w/o difficulty. Affect pleasant   ECG: AF 86 LAFB. Personally reviewed    Recent Labs: 11/08/2020: B Natriuretic Peptide 85.3; Hemoglobin 13.5; Platelets 193 11/17/2020: BUN 44; Creatinine, Ser 1.55; Potassium 4.9; Sodium 132  Personally reviewed   Wt Readings from Last 3 Encounters:  01/17/21 98.2 kg (216 lb 9.6 oz)  11/08/20 97.2 kg (214 lb 3.2 oz)  04/12/20 106.1 kg (233 lb 12.8 oz)      ASSESSMENT AND PLAN:    1) Chronic diastolic HF in setting of severe AS - Echo today 01/17/21: EF 60-65% TAVR stable. Severe biatrial enlargement. Personally reviewed - Doing well NHYA I-II  - Volume status ok on torsemide 20 daily - Consider Jardiance 10 (if start Jardiance use torsemide only as needed) - he will talk to his endocrinologist at [facility]  2) Severe AS - s/p TAVR 1/21 - Valve looks good on recent echo - Aware of need for SBE prophylaxis   3) HTN - Blood pressure generally well controlled. Continue current regimen. - Has clonidine 0.1 prn for SBP > = 160 but hasn't had to use   4) CAD - No current s/s ischemia.  - cath 12/20 with stable non-obstructive CAD - Continue Crestor Not on ASA due to Eliquis. Not on BB with bradycardia   5) CML - Following with Dr. Don Broach at [facility] - On nilotinib   6) Atrial fibrillation, permanent - Tikosyn stopped 5/17 due to persistent AF. - Remains in AF today. Rate ok.  - Continue Eliquis 52.5 bid (CR > 1.5 Age > 80)   7) Carotid Stenosis - Carotid duplex 12/2013; stable 0-39% bilateral ICA stenosis - Continue Crestor - F/u as needed   8) DM2 - followed by [facility] - As above consider Jardiance he will talk to his endocrinologist at [facility]  9) CKD 3b - Creatinine 1.5-1.6  Signed, Glori Bickers,  MD  01/17/2021 3:39 PM  Advanced [facility] Failure [place] [address] [phone] (office) [phone] (fax)

## 2021-01-17 NOTE — Patient Instructions (Addendum)
Your physician recommends that you schedule a follow-up appointment in: 1 year (October 2023), **PLEASE CALL OUR OFFICE IN AUGUST TO SCHEDULE THIS APPOINTMENT  If you have any questions or concerns before your next appointment please send [place] a message through [place] or call our office at [phone].    TO LEAVE A MESSAGE FOR THE NURSE SELECT OPTION 2, PLEASE LEAVE A MESSAGE INCLUDING: YOUR NAME DATE OF BIRTH CALL BACK NUMBER REASON FOR CALL**this is important as we prioritize the call backs  YOU WILL RECEIVE A CALL BACK THE SAME DAY AS LONG AS YOU CALL BEFORE 4:00 PM  At the [facility], you and your health needs are our priority. As part of our continuing mission to provide you with exceptional heart care, we have created designated Provider Care Teams. These Care Teams include your primary Cardiologist (physician) and Advanced Practice Providers (APPs- Physician Assistants and Nurse Practitioners) who all work together to provide you with the care you need, when you need it.   You may see any of the following providers on your designated Care Team at your next follow up: Dr Glori Bickers Dr Loralie Champagne Dr Patrice Paradise, NP Lyda Jester, [place] Ginnie Smart Audry Riles, PharmD   Please be sure to bring in all your medications bottles to every appointment.

## 2021-02-16 ENCOUNTER — Ambulatory Visit (INDEPENDENT_AMBULATORY_CARE_PROVIDER_SITE_OTHER): Payer: Medicare Other

## 2021-02-16 DIAGNOSIS — Z0000 Encounter for general adult medical examination without abnormal findings: Secondary | ICD-10-CM

## 2021-02-16 NOTE — Progress Notes (Signed)
I connected with Loralee Pacas today by telephone and verified that I am speaking with the correct person using two identifiers. Location patient: home Location provider: work Persons participating in the virtual visit: patient, provider.   I discussed the limitations, risks, security and privacy concerns of performing an evaluation and management service by telephone and the availability of in person appointments. I also discussed with the patient that there may be a patient responsible charge related to this service. The patient expressed understanding and verbally consented to this telephonic visit.    Interactive audio and video telecommunications were attempted between this provider and patient, however failed, due to patient having technical difficulties OR patient did not have access to video capability.  We continued and completed visit with audio only.  Some vital signs may be absent or patient reported.   Time Spent with patient on telephone encounter: 40 minutes  Subjective:   [name] White is a 85 y.o. male who presents for Medicare Annual/Subsequent preventive examination.  Review of Systems     Cardiac Risk Factors include: advanced age (>40men, >44 women);diabetes mellitus;dyslipidemia;family history of premature cardiovascular disease;male gender;hypertension     Objective:    There were no vitals filed for this visit. There is no height or weight on file to calculate BMI.  Advanced Directives 02/16/2021 02/13/2020 12/22/2019 04/13/2019 04/09/2019 03/31/2019 03/18/2019  Does Patient Have a Medical Advance Directive? Yes Yes Yes Yes Yes Yes Yes  Type of Advance Directive Living will;Healthcare Power of [place];Living will Healthcare Power of Attorney Living will Living will [place];Living will [place]  Does patient want to make changes to medical advance directive? No - Patient declined - No - Patient  declined No - Patient declined - - No - Patient declined  Copy of [place] in Chart? No - copy requested - No - copy requested - - - No - copy requested  Would patient like information on creating a medical advance directive? - - - - - - -  Pre-existing out of facility DNR order (yellow form or pink MOST form) - - - - - - -    Current Medications (verified) Outpatient Encounter Medications as of 02/16/2021  Medication Sig   empagliflozin (JARDIANCE) 25 MG TABS tablet Take 25 mg by mouth daily. Take 1/2 tablet in the morning.   amoxicillin (AMOXIL) 500 MG tablet Take 4 capsules (2,000 mg) one hour prior to all dental visits. (Patient not taking: No sig reported)   apixaban (ELIQUIS) 2.5 MG TABS tablet Take 1 tablet (2.5 mg total) by mouth 2 (two) times daily.   Brinzolamide-Brimonidine (SIMBRINZA) 1-0.2 % SUSP Apply to eye.   CINNAMON PO Take 2,000 mg by mouth 2 (two) times daily.    clobetasol (TEMOVATE) 0.05 % external solution Apply 1 application topically. Applies 3 times a day for bumps in scalp.   cloNIDine (CATAPRES) 0.1 MG tablet TAKE 1 TABLET AS NEEDED FOR SYSTOLIC BLOOD PRESSURE GREATER THAN 160 (Patient not taking: Reported on 01/17/2021)   Coenzyme Q10 ([employer] COQ-10) 200 MG capsule Take 200 mg by mouth every evening.    ergocalciferol (VITAMIN D2) 1.25 MG (50000 UT) capsule Take 50,000 Units by mouth every Monday.   famotidine (PEPCID) 20 MG tablet Take 20 mg by mouth 2 (two) times daily.   ferrous sulfate 325 (65 FE) MG tablet Take 325 mg by mouth every evening.    finasteride (PROSCAR) 5 MG tablet Take  5 mg by mouth every evening.   levothyroxine (SYNTHROID) 200 MCG tablet Take 200 mcg by mouth daily before breakfast.   loratadine (CLARITIN) 10 MG tablet Take 1 tablet (10 mg total) by mouth daily.   metFORMIN (GLUCOPHAGE) 500 MG tablet Take 500 mg by mouth 2 (two) times daily with a meal.   nilotinib (TASIGNA) 150 MG capsule Take 300 mg by mouth 2 (two) times  daily. Take on an empty stomach, 1 hr before or 2 hrs after food.   PARoxetine (PAXIL) 40 MG tablet Take 1 tablet (40 mg total) by mouth every morning.   potassium chloride SA (KLOR-CON M20) 20 MEQ tablet Take 3 tablets (60 mEq total) by mouth daily for 3 days, THEN 1 tablet (20 mEq total) daily.   rosuvastatin (CRESTOR) 10 MG tablet Take 1 tablet (10 mg total) by mouth daily. Please schedule an appointment for further refills   spironolactone (ALDACTONE) 25 MG tablet Take 12.5 mg by mouth daily.   torsemide (DEMADEX) 20 MG tablet Take 2 tablets (40 mg total) by mouth 2 (two) times daily for 2 days, THEN 1 tablet (20 mg total) daily.   traZODone (DESYREL) 50 MG tablet Take 50 mg by mouth at bedtime.   TURMERIC PO Take 1,000 mg by mouth every evening.    No facility-administered encounter medications on file as of 02/16/2021.    Allergies (verified) Ace inhibitors, Benazepril, Codeine, Statins, Piroxicam, Pseudoephedrine, Dasatinib, and Betadine [povidone iodine]   History: Past Medical History:  Diagnosis Date   Anemia 06/04/2015   Anxiety    Atrial flutter ([place])    s/p ablation 07/17/08   Cancer ([place])    leukemia - dx 05/2018   Chronic diastolic CHF (congestive heart failure) ([place])    Coronary artery disease    Depression    Diabetes mellitus type 2 in obese ([place]) 04/10/2009   Qualifier: Diagnosis of  By: Arnoldo Morale MD, Balinda Quails, diet controlled, lost 60lbs   Emphysema of lung (H[facility])    GERD (gastroesophageal reflux disease)    Glaucoma    recent dx - has an appt to be evaluated and meds to treat   H/O hiatal hernia    Hearing loss    some hearing loss in left ear but has not been dx, no hearing aids   Hepatitis    h/o of type A   History of measles as a child    History of mumps as a child    Hypertension    Hypothyroidism    Micturition syncope    Osteoarthritis    Pleural effusion, bilateral    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   26 mm Edwards Sapien 3  transcatheter heart valve placed via percutaneous right transfemoral approach    Severe aortic stenosis    Sleep apnea    uses CPAP nightly   Vitamin D deficiency 11/24/2015   Past Surgical History:  Procedure Laterality Date   CARDIAC CATHETERIZATION  03/18/2019   CARDIAC ELECTROPHYSIOLOGY MAPPING AND ABLATION  06/2008   CATARACT EXTRACTION W/ INTRAOCULAR LENS  IMPLANT, BILATERAL  ~ 2007   COLONOSCOPY     FOOT TENDON SURGERY Left    INTRAOPERATIVE TRANSTHORACIC ECHOCARDIOGRAM N/A 04/13/2019   Procedure: TRANSTHORACIC ECHOCARDIOGRAM;  Surgeon: Sherren Mocha, MD;  Location: [facility];  Service: Open Heart Surgery;  Laterality: N/A;   RIGHT/LEFT HEART CATH AND CORONARY ANGIOGRAPHY N/A 03/18/2019   Procedure: RIGHT/LEFT HEART CATH AND CORONARY ANGIOGRAPHY;  Surgeon: Jolaine Artist, MD;  Location:  [facility];  Service: Cardiovascular;  Laterality: N/A;   TONSILLECTOMY  1940's   TOTAL KNEE ARTHROPLASTY  1990's   right   TOTAL KNEE ARTHROPLASTY Left 08/02/2013   Procedure: LEFT TOTAL KNEE ARTHROPLASTY;  Surgeon: Gearlean Alf, MD;  Location: [facility];  Service: Orthopedics;  Laterality: Left;   TRANSCATHETER AORTIC VALVE REPLACEMENT, TRANSFEMORAL N/A 04/13/2019   Procedure: TRANSCATHETER AORTIC VALVE REPLACEMENT, TRANSFEMORAL;  Surgeon: Sherren Mocha, MD;  Location: [facility];  Service: Open Heart Surgery;  Laterality: N/A;   UPPER GI ENDOSCOPY     WISDOM TOOTH EXTRACTION     Family History  Problem Relation Age of Onset   Multiple sclerosis Father    Heart disease Mother        atrial fibrillation   Hypertension Son    Heart disease Maternal Grandfather    Social History   Socioeconomic History   Marital status: Married    Spouse name: Not on file   Number of children: Not on file   Years of education: Not on file   Highest education level: Not on file  Occupational History   Not on file  Tobacco Use   Smoking status: Former    Types: Pipe, Cigars    Quit date: 04/01/1996     Years since quitting: 24.8   Smokeless tobacco: Former    Types: Chew    Quit date: 04/01/2009  Vaping Use   Vaping Use: Never used  Substance and Sexual Activity   Alcohol use: Not Currently    Alcohol/week: 0.0 standard drinks    Comment: Quit wine early 2020.   Drug use: No   Sexual activity: Not Currently    Comment: lives with wife, eats clean diet. No major dietary restrtictions,   Other Topics Concern   Not on file  Social History Narrative   Not on file   Social Determinants of Health   Financial Resource Strain: Low Risk    Difficulty of Paying Living Expenses: Not hard at all  Food Insecurity: No Food Insecurity   Worried About Charity fundraiser in the Last Year: Never true   [place] in the Last Year: Never true  Transportation Needs: No Transportation Needs   Lack of Transportation (Medical): No   Lack of Transportation (Non-Medical): No  Physical Activity: Sufficiently Active   Days of Exercise per Week: 5 days   Minutes of Exercise per Session: 30 min  Stress: No Stress Concern Present   Feeling of Stress : Not at all  Social Connections: Socially Integrated   Frequency of Communication with Friends and Family: More than three times a week   Frequency of Social Gatherings with Friends and Family: More than three times a week   Attends Religious Services: More than 4 times per year   Active Member of [employer] or Organizations: Yes   Attends Music therapist: More than 4 times per year   Marital Status: Married    Tobacco Counseling Counseling given: Not Answered   Clinical Intake:  Pre-visit preparation completed: Yes  Pain : No/denies pain     Nutritional Risks: None Diabetes: Yes CBG done?: No Did pt. bring in CBG monitor from home?: No  How often do you need to have someone help you when you read instructions, pamphlets, or other written materials from your doctor or pharmacy?: 1 - Never What is the last grade level you  completed in school?: High School Graduate; Retired from the [employer]  Diabetic? yes  Interpreter Needed?: No  Information entered by :: Lisette Abu, LPN   Activities of Daily Living In your present state of health, do you have any difficulty performing the following activities: 02/16/2021  Hearing? N  Vision? N  Difficulty concentrating or making decisions? N  Walking or climbing stairs? N  Comment has an elevator in the home  Dressing or bathing? N  Doing errands, shopping? N  Preparing Food and eating ? N  Using the Toilet? N  In the past six months, have you accidently leaked urine? N  Do you have problems with loss of bowel control? N  Managing your Medications? N  Managing your Finances? N  Housekeeping or managing your Housekeeping? N  Some recent data might be hidden    Patient Care Team: Plotnikov, Evie Lacks, MD as PCP - General (Internal Medicine) Bensimhon, Shaune Pascal, MD as PCP - Advanced Heart Failure (Cardiology) Mercy Moore, OD as Consulting Physician (Optometry) Bensimhon, Shaune Pascal, MD as Consulting Physician (Cardiology) Gaynelle Arabian, MD as Consulting Physician (Orthopedic Surgery) Kerrie Buffalo, MD as Consulting Physician (Cardiology) Henreitta Cea, MD as Consulting Physician (Family Medicine) Garry Heater, DDS (Dentistry) Sherren Mocha, MD as Consulting Physician (Cardiology) Evans Lance, MD as Consulting Physician (Cardiology)  Indicate any recent Medical Services you may have received from other than [facility] providers in the past year (date may be approximate).     Assessment:   This is a routine wellness examination for Darrell.  Hearing/Vision screen Hearing Screening - Comments:: Patient denied any hearing difficulty.   No hearing aids.  Vision Screening - Comments:: Patient wears eyeglasses.  [facility] exam done: [facility].  Dietary issues and exercise activities discussed: Current Exercise Habits: Home exercise routine,  Type of exercise: walking, Time (Minutes): 30, Frequency (Times/Week): 5, Weekly Exercise (Minutes/Week): 150, Intensity: Mild, Exercise limited by: orthopedic condition(s);cardiac condition(s)   Goals Addressed               This Visit's Progress     Patient Stated (pt-stated)        My goal is to continue to be independent, physically and socially active.      Depression Screen PHQ 2/9 Scores 02/16/2021 05/15/2020 12/22/2019 12/22/2019 09/21/2019 09/20/2019 01/10/2017  PHQ - 2 Score 0 0 0 0 0 0 0  PHQ- 9 Score - 1 - - 0 - -    Fall Risk Fall Risk  02/16/2021 12/22/2019 09/20/2019 01/10/2017 07/29/2016  Falls in the past year? 0 0 0 No No  Number falls in past yr: 0 0 0 - -  Injury with Fall? 0 0 0 - -  Risk for fall due to : No Fall Risks Impaired balance/gait;Impaired vision - - -  Follow up Falls evaluation completed Falls evaluation completed;Education provided - - -    FALL RISK PREVENTION PERTAINING TO THE HOME:  Any stairs in or around the home? Yes  (elevator in the home) If so, are there any without handrails? No  Home free of loose throw rugs in walkways, pet beds, electrical cords, etc? Yes  Adequate lighting in your home to reduce risk of falls? Yes   ASSISTIVE DEVICES UTILIZED TO PREVENT FALLS:  Life alert? No  Use of a cane, walker or w/c? No  Grab bars in the bathroom? Yes  Shower chair or bench in shower? Yes  Elevated toilet seat or a handicapped toilet? Yes   TIMED UP AND GO:  Was the test performed? No .  Length of time to ambulate 10 feet: n/a sec.   Gait steady and fast without use of assistive device  Cognitive Function: Normal cognitive status assessed by direct observation by this Nurse Health Advisor. No abnormalities found.   MMSE - Mini Mental State Exam 07/29/2016  Orientation to time 5  Orientation to Place 5  Registration 3  Attention/ Calculation 4  Recall 3  Language- name 2 objects 2  Language- repeat 1  Language- follow 3 step  command 3  Language- read & follow direction 1  Write a sentence 1  Copy design 1  Total score 29     6CIT Screen 12/22/2019  What Year? 0 points  What month? 0 points  What time? 0 points  Count back from 20 0 points  Months in reverse 0 points  Repeat phrase 0 points  Total Score 0    Immunizations Immunization History  Administered Date(s) Administered   Fluad Quad(high Dose 65+) 01/20/2020, 01/31/2020   Influenza Split 12/31/2011   Influenza Whole 01/11/2009, 02/08/2010   Influenza, High Dose Seasonal PF 12/05/2010, 12/31/2011, 01/12/2013, 11/24/2015, 02/12/2016, 01/10/2017, 12/30/2017, 12/17/2018   Influenza,inj,Quad PF,6+ Mos 02/01/2014, 12/15/2014   Influenza-Unspecified 12/30/2001, 12/31/2002, 12/31/2003, 01/30/2005, 02/08/2005, 01/20/2006, 12/18/2006, 01/31/2008, 11/30/2008, 12/18/2017, 12/19/2018, 01/01/2021   Moderna Sars-Covid-2 Vaccination 05/04/2019, 06/02/2019, 01/20/2020   Pneumococcal Conjugate-13 11/24/2015, 10/10/2016   Pneumococcal Polysaccharide-23 10/29/2008   Pneumococcal-Unspecified 12/23/2003   Td 11/15/2002, 04/30/2008   Tdap 09/25/2011    TDAP status: Up to date  Flu Vaccine status: Up to date  Pneumococcal vaccine status: Up to date  Covid-19 vaccine status: Completed vaccines  Qualifies for Shingles Vaccine? Yes   Zostavax completed No   Shingrix Completed?: No.    Education has been provided regarding the importance of this vaccine. Patient has been advised to call insurance company to determine out of pocket expense if they have not yet received this vaccine. Advised may also receive vaccine at local pharmacy or Health Dept. Verbalized acceptance and understanding.  Screening Tests Health Maintenance  Topic Date Due   Zoster Vaccines- Shingrix (1 of 2) Never done   FOOT EXAM  07/29/2017   OPHTHALMOLOGY EXAM  11/20/2018   HEMOGLOBIN A1C  01/13/2020   COVID-19 Vaccine (4 - Booster for Moderna series) 03/16/2020   TETANUS/TDAP   09/24/2021   Pneumonia Vaccine 80+ Years old  Completed   INFLUENZA VACCINE  Completed   HPV VACCINES  Aged Out    Health Maintenance  Health Maintenance Due  Topic Date Due   Zoster Vaccines- Shingrix (1 of 2) Never done   FOOT EXAM  07/29/2017   OPHTHALMOLOGY EXAM  11/20/2018   HEMOGLOBIN A1C  01/13/2020   COVID-19 Vaccine (4 - Booster for Moderna series) 03/16/2020    Colorectal cancer screening: No longer required.   Lung Cancer Screening: (Low Dose CT Chest recommended if Age 42-80 years, 30 pack-year currently smoking OR have quit w/in 15years.) does not qualify.   Lung Cancer Screening Referral: no  Additional Screening:  Hepatitis C Screening: does not qualify; Completed no  Vision Screening: Recommended annual ophthalmology exams for early detection of glaucoma and other disorders of the eye. Is the patient up to date with their annual eye exam?  Yes  Who is the provider or what is the name of the office in which the patient attends annual eye exams? [facility] If pt is not established with a provider, would they like to be referred to a provider to establish care? No .  Dental Screening: Recommended annual dental exams for proper oral hygiene  Community Resource Referral / Chronic Care Management: CRR required this visit?  No   CCM required this visit?  No      Plan:     I have personally reviewed and noted the following in the patient's chart:   Medical and social history Use of alcohol, tobacco or illicit drugs  Current medications and supplements including opioid prescriptions. Patient is not currently taking opioid prescriptions. Functional ability and status Nutritional status Physical activity Advanced directives List of other physicians Hospitalizations, surgeries, and ER visits in previous 12 months Vitals Screenings to include cognitive, depression, and falls Referrals and appointments  In addition, I have reviewed  and discussed with patient certain preventive protocols, quality metrics, and best practice recommendations. A written personalized care plan for preventive services as well as general preventive health recommendations were provided to patient.     Sheral Flow, LPN   [id number]   Nurse Notes:  Patient is cogitatively intact. There were no vitals filed for this visit. There is no height or weight on file to calculate BMI. Hearing Screening - Comments:: Patient denied any hearing difficulty.   No hearing aids.  Vision Screening - Comments:: Patient wears eyeglasses.  Annual eye exam done: [facility].

## 2021-03-13 IMAGING — CT CT ANGIO CHEST
2 of 7 series · 15 of 36 positions shown · IV contrast (APPLIED)
Comparison: Cardiac CTA 08/13/2011.

CLINICAL DATA: 83-year-old male with history of aortic valve
stenosis. Preprocedural study prior to potential transcatheter
aortic valve replacement (TAVR) procedure.

EXAM:
CT ANGIOGRAPHY CHEST, ABDOMEN AND PELVIS
TECHNIQUE: Multidetector CT imaging through the chest, abdomen and pelvis was
performed using the standard protocol during bolus administration of
intravenous contrast. Multiplanar reconstructed images and MIPs were
obtained and reviewed to evaluate the vascular anatomy.
CONTRAST:  100mL OMNIPAQUE IOHEXOL 350 MG/ML SOLN

[Series 5: ax thins · axial · 0.59mm/px · z∈[+793,+1390]mm · 14 of 689 slices shown]
[im 46/689  lung]
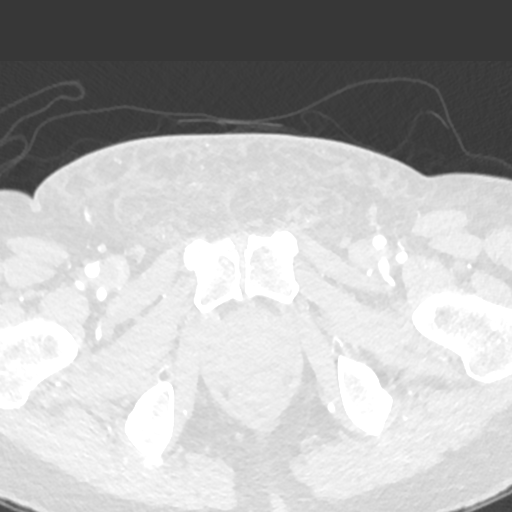
[im 92/689  mediastinal]
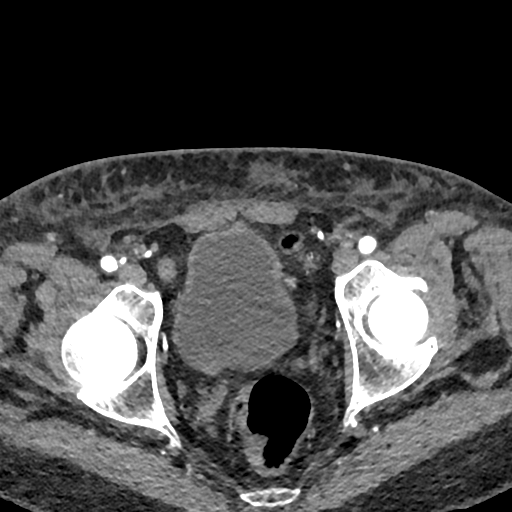
[im 138/689  lung]
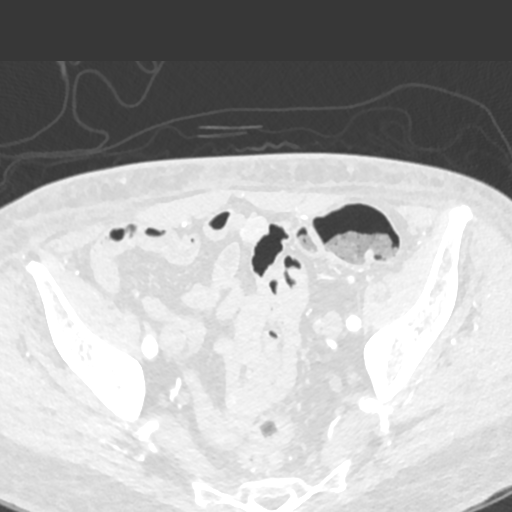
[im 184/689  mediastinal]
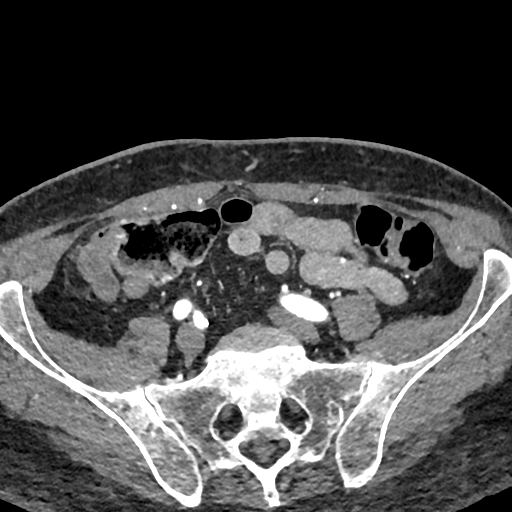
[im 230/689  lung]
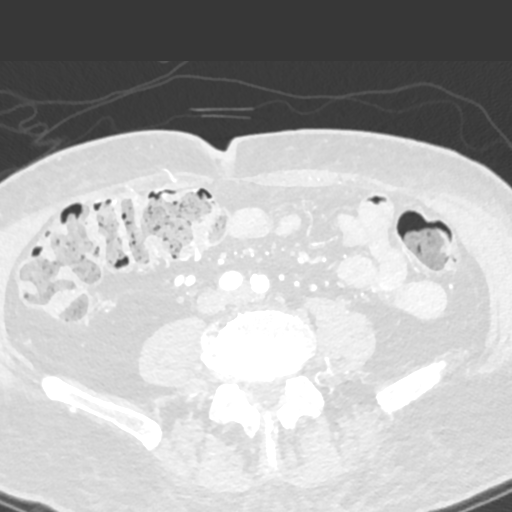
[im 276/689  mediastinal]
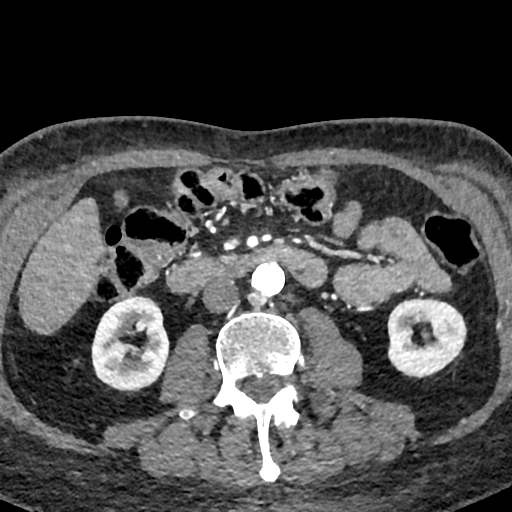
[im 322/689  lung]
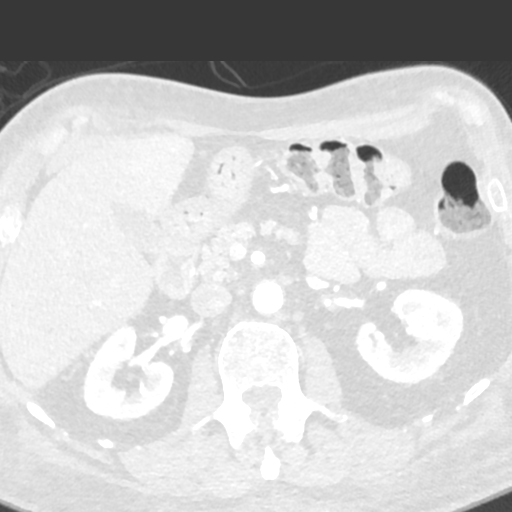
[im 367/689  mediastinal]
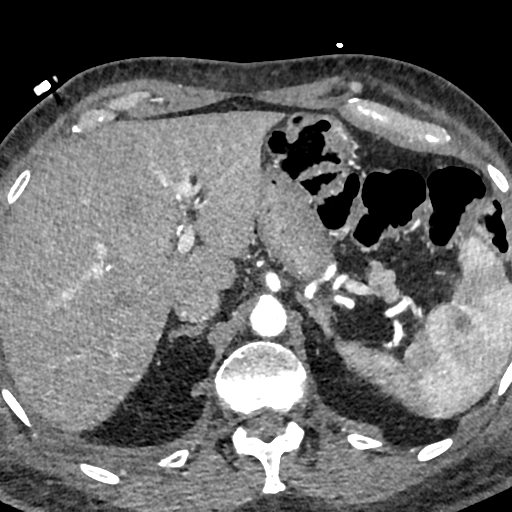
[im 413/689  lung]
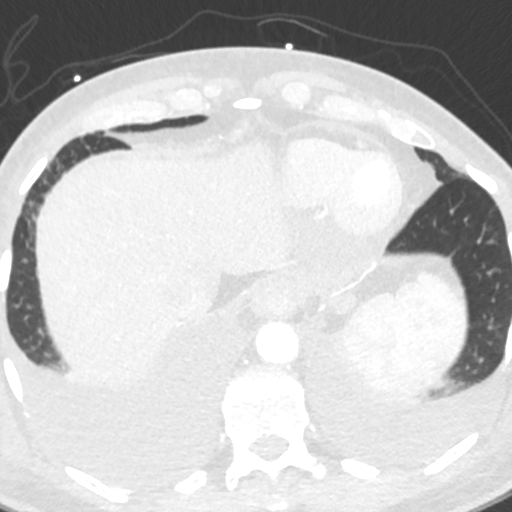
[im 459/689  mediastinal]
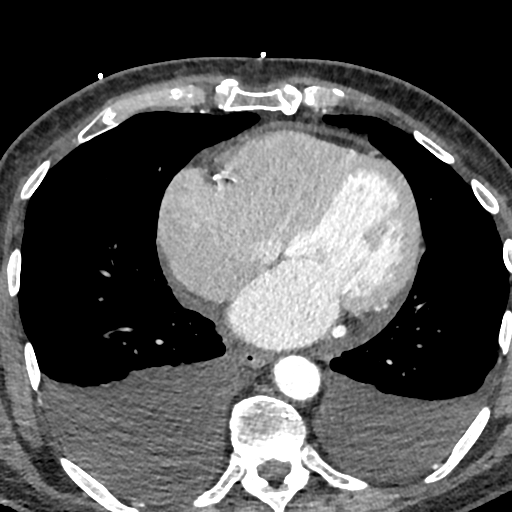
[im 505/689  lung]
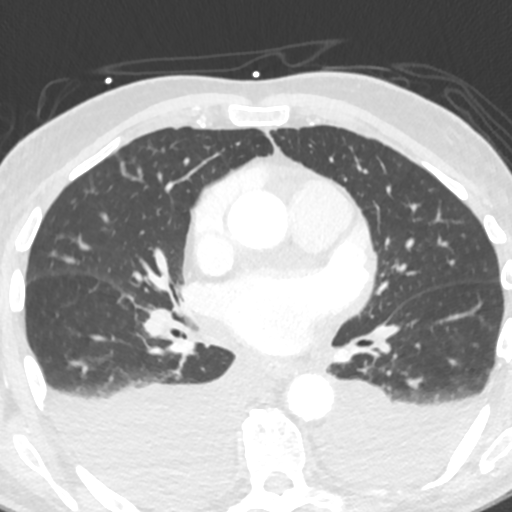
[im 551/689  mediastinal]
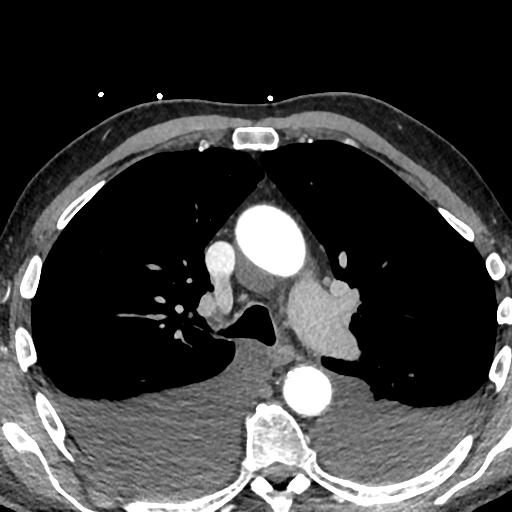
[im 597/689  lung]
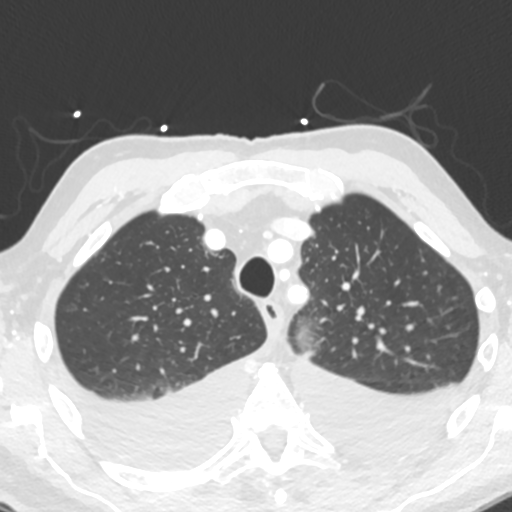
[im 643/689  mediastinal]
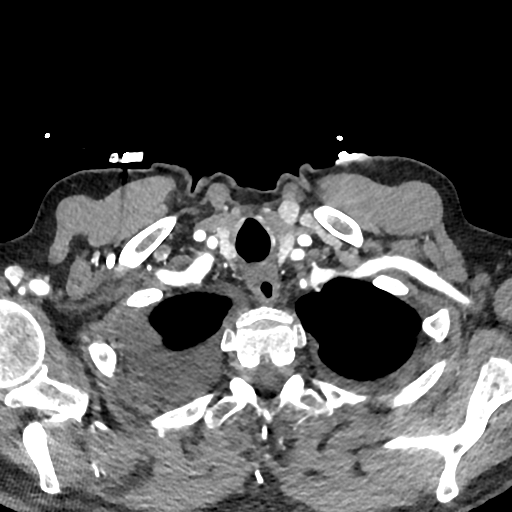

[Series 8: cor · coronal · 0.87mm/px · 1 of 153 slices shown]
[im 77/153  mediastinal]
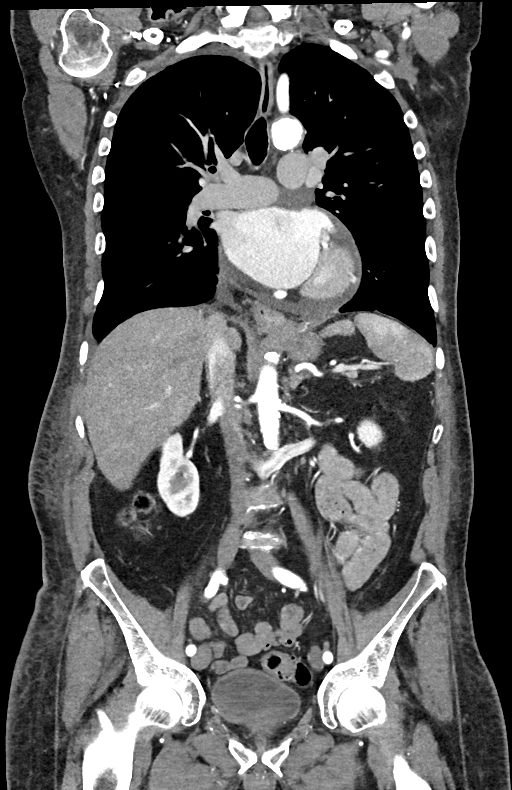

[15 of 36 positions shown; findings below may reference images not displayed]

FINDINGS: CTA CHEST FINDINGS

Cardiovascular: Heart size is mildly enlarged with left atrial
dilatation. There is no significant pericardial fluid, thickening or
pericardial calcification. Tiny filling defect in the tip of the
left atrial appendage, concerning for potential left atrial
appendage thrombus. There is aortic atherosclerosis, as well as
atherosclerosis of the great vessels of the mediastinum and the
coronary arteries, including calcified atherosclerotic plaque in the
left anterior descending, left circumflex and right coronary
arteries. Severe thickening calcification of the aortic valve.

Mediastinum/Lymph Nodes: No pathologically enlarged mediastinal or
hilar lymph nodes. Esophagus is unremarkable in appearance. No
axillary lymphadenopathy.

Lungs/Pleura: Moderate to large bilateral pleural effusions lying
dependently with associated areas of passive subsegmental
atelectasis in the lower lobes of the lungs bilaterally. No acute
consolidative airspace disease. No suspicious appearing pulmonary
nodules or masses are noted.

Musculoskeletal/Soft Tissues: There are no aggressive appearing
lytic or blastic lesions noted in the visualized portions of the
skeleton.

CTA ABDOMEN AND PELVIS FINDINGS

Hepatobiliary: No suspicious cystic or solid hepatic lesions. No
intra or extrahepatic biliary ductal dilatation. Gallbladder is
unremarkable in appearance.

Pancreas: No pancreatic mass. No pancreatic ductal dilatation. No
pancreatic or peripancreatic fluid collections or inflammatory
changes.

Spleen: Well-defined 2.7 cm intermediate attenuation lesion in the
spleen, incompletely characterized, but favored to represent a
benign lesion.

Adrenals/Urinary Tract: Subcentimeter low-attenuation lesions in the
left kidney, too small to characterize, but statistically likely to
represent tiny cysts. Right kidney and bilateral adrenal glands are
normal in appearance. No hydroureteronephrosis. Urinary bladder is
normal in appearance.

Stomach/Bowel: Normal appearance of the stomach. No pathologic
dilatation of small bowel or colon. A few scattered colonic
diverticulae are noted, without surrounding inflammatory changes to
suggest an acute diverticulitis at this time. The appendix is not
confidently identified and may be surgically absent. Regardless,
there are no inflammatory changes noted adjacent to the cecum to
suggest the presence of an acute appendicitis at this time.

Vascular/Lymphatic: Aortic atherosclerosis, without evidence of
aneurysm or dissection in the abdominal or pelvic vasculature.
Vascular findings and measurements pertinent to potential TAVR
procedure, as detailed below. Retroaortic left renal vein (normal
anatomical variant) incidentally noted. No lymphadenopathy noted in
the abdomen or pelvis.

Reproductive: Prostate gland is enlarged. Seminal vesicles are
unremarkable in appearance.

Other: No significant volume of ascites.  No pneumoperitoneum.

Musculoskeletal: There are no aggressive appearing lytic or blastic
lesions noted in the visualized portions of the skeleton.

VASCULAR MEASUREMENTS PERTINENT TO TAVR:

AORTA:

Minimal Aortic Aiameter-O x 15 mm

Severity of Aortic Calcification-severe

RIGHT PELVIS:

Right Common Iliac Artery -

Minimal 7iameter-6L.8 x 9.3 mm

Tortuosity-mild

Calcification - mild

Right External Iliac Artery -

Minimal Ciameter-H.C x 8.0 mm

Tortuosity-moderate to severe

Calcification - mild

Right Common Femoral Artery -

Minimal Ciameter-4.R x 7.7 mm

Tortuosity-mild

Calcification - mild

LEFT PELVIS:

Left Common Iliac Artery -

Minimal Iiameter-O.O x 8.7 mm

Tortuosity-mild

Calcification - mild

Left External Iliac Artery -

Minimal 1iameter-Q.I x 8.0 mm

Tortuosity-moderate

Calcification-none

Left Common Femoral Artery -

Minimal 8iameter-0.2 x 8.3 mm

Tortuosity-mild

Calcification - mild

Review of the MIP images confirms the above findings.
IMPRESSION: 1. Vascular findings and measurements pertinent to potential TAVR
procedure, as detailed above.
2. Severe thickening calcification of the aortic valve, compatible
with the reported clinical history of severe aortic stenosis.
3. Filling defect in the tip of the left atrial appendage. Given the
cardiomegaly with left atrial dilatation, this is concerning for
left atrial appendage thrombus, but could simply represent "pseudo
thrombus" related to incomplete mixing of contrast material.
Attention at time of forthcoming transesophageal echocardiography is
suggested.
4. Aortic atherosclerosis, in addition to 3 vessel coronary artery
disease.
5. Colonic diverticulosis without evidence of acute diverticulitis
at this time.
6. Additional incidental findings, as above.

## 2021-04-11 ENCOUNTER — Telehealth (HOSPITAL_COMMUNITY): Payer: Self-pay | Admitting: *Deleted

## 2021-04-11 NOTE — Telephone Encounter (Signed)
Pt called stating eliquis was decreased to 2.5mg . pt takes metformin and jardiance and said his diabetes is controlled and his pcp wants to know if he should remain on 2.5mg  or increase back to 5mg .   Routed to [place] for advice

## 2021-04-13 NOTE — Telephone Encounter (Signed)
Pt aware and agreeable will inform pcp

## 2021-05-02 DIAGNOSIS — D485 Neoplasm of uncertain behavior of skin: Secondary | ICD-10-CM | POA: Diagnosis not present

## 2021-05-02 DIAGNOSIS — L080 Pyoderma: Secondary | ICD-10-CM | POA: Diagnosis not present

## 2021-05-02 DIAGNOSIS — L538 Other specified erythematous conditions: Secondary | ICD-10-CM | POA: Diagnosis not present

## 2021-05-02 DIAGNOSIS — L02232 Carbuncle of back [any part, except buttock]: Secondary | ICD-10-CM | POA: Diagnosis not present

## 2021-05-02 DIAGNOSIS — L298 Other pruritus: Secondary | ICD-10-CM | POA: Diagnosis not present

## 2021-05-21 ENCOUNTER — Ambulatory Visit (INDEPENDENT_AMBULATORY_CARE_PROVIDER_SITE_OTHER): Payer: Medicare Other | Admitting: Internal Medicine

## 2021-05-21 ENCOUNTER — Other Ambulatory Visit: Payer: Self-pay

## 2021-05-21 ENCOUNTER — Encounter: Payer: Self-pay | Admitting: Internal Medicine

## 2021-05-21 DIAGNOSIS — E1169 Type 2 diabetes mellitus with other specified complication: Secondary | ICD-10-CM

## 2021-05-21 DIAGNOSIS — E669 Obesity, unspecified: Secondary | ICD-10-CM

## 2021-05-21 DIAGNOSIS — K4020 Bilateral inguinal hernia, without obstruction or gangrene, not specified as recurrent: Secondary | ICD-10-CM

## 2021-05-21 DIAGNOSIS — G8929 Other chronic pain: Secondary | ICD-10-CM | POA: Diagnosis not present

## 2021-05-21 DIAGNOSIS — K4090 Unilateral inguinal hernia, without obstruction or gangrene, not specified as recurrent: Secondary | ICD-10-CM | POA: Insufficient documentation

## 2021-05-21 DIAGNOSIS — I482 Chronic atrial fibrillation, unspecified: Secondary | ICD-10-CM | POA: Diagnosis not present

## 2021-05-21 DIAGNOSIS — M25511 Pain in right shoulder: Secondary | ICD-10-CM | POA: Diagnosis not present

## 2021-05-21 NOTE — Patient Instructions (Addendum)
Hernia truss    Inguinal Hernia, Adult An inguinal hernia is when fat or your intestines push through a weak spot in a muscle where your leg meets your lower belly (groin). This causes a bulge. This kind of hernia could also be: In your scrotum, if you are male. In folds of skin around your vagina, if you are male. There are three types of inguinal hernias: Hernias that can be pushed back into the belly (are reducible). This type rarely causes pain. Hernias that cannot be pushed back into the belly (are incarcerated). Hernias that cannot be pushed back into the belly and lose their blood supply (are strangulated). This type needs emergency surgery. What are the causes? This condition is caused by having a weak spot in the muscles or tissues in your groin. This develops over time. The hernia may poke through the weak spot when you strain your lower belly muscles all of a sudden, such as when you: Lift a heavy object. Strain to poop (have a bowel movement). Trouble pooping (constipation) can lead to straining. Cough. What increases the risk? This condition is more likely to develop in: Males. Pregnant females. People who: Are overweight. Work in jobs that require long periods of standing or heavy lifting. Have had an inguinal hernia before. Smoke or have lung disease. These factors can lead to long-term (chronic) coughing. What are the signs or symptoms? Symptoms may depend on the size of the hernia. Often, a small hernia has no symptoms. Symptoms of a larger hernia may include: A bulge in the groin area. This is easier to see when standing. You might not be able to see it when you are lying down. Pain or burning in the groin. This may get worse when you lift, strain, or cough. A dull ache or a feeling of pressure in the groin. An abnormal bulge in the scrotum, in males. Symptoms of a strangulated inguinal hernia may include: A bulge in your groin that is very painful and tender to  the touch. A bulge that turns red or purple. Fever, feeling like you may vomit (nausea), and vomiting. Not being able to poop or to pass gas. How is this treated? Treatment depends on the size of your hernia and whether you have symptoms. If you do not have symptoms, your doctor may have you watch your hernia carefully and have you come in for follow-up visits. If your hernia is large or if you have symptoms, you may need surgery to repair the hernia. Follow these instructions at home: Lifestyle Avoid lifting heavy objects. Avoid standing for long amounts of time. Do not smoke or use any products that contain nicotine or tobacco. If you need help quitting, ask your doctor. Stay at a healthy weight. Prevent trouble pooping You may need to take these actions to prevent or treat trouble pooping: Drink enough fluid to keep your pee (urine) pale yellow. Take over-the-counter or prescription medicines. Eat foods that are high in fiber. These include beans, whole grains, and fresh fruits and vegetables. Limit foods that are high in fat and sugar. These include fried or sweet foods. General instructions You may try to push your hernia back in place by very gently pressing on it when you are lying down. Do not try to push the bulge back in if it will not go in easily. Watch your hernia for any changes in shape, size, or color. Tell your doctor if you see any changes. Take over-the-counter and prescription medicines only as  told by your doctor. Keep all follow-up visits. Contact a doctor if: You have a fever or chills. You have new symptoms. Your symptoms get worse. Get help right away if: You have pain in your groin that gets worse all of a sudden. You have a bulge in your groin that: Gets bigger all of a sudden, and it does not get smaller after that. Turns red or purple. Is painful when you touch it. You are a male, and you have: Sudden pain in your scrotum. A sudden change in the size of  your scrotum. You cannot push the hernia back in place by very gently pressing on it when you are lying down. You feel like you may vomit, and that feeling does not go away. You keep vomiting. You have a fast heartbeat. You cannot poop or pass gas. These symptoms may be an emergency. Get help right away. Call your local emergency services (911 in the [place] Do not wait to see if the symptoms will go away. Do not drive yourself to the hospital. Summary An inguinal hernia is when fat or your intestines push through a weak spot in a muscle where your leg meets your lower belly (groin). This causes a bulge. If you do not have symptoms, you may not need treatment. If you have symptoms or a large hernia, you may need surgery. Avoid lifting heavy objects. Also, avoid standing for long amounts of time. Do not try to push the bulge back in if it will not go in easily. This information is not intended to replace advice given to you by your health care provider. Make sure you discuss any questions you have with your health care provider. Document Revised: 11/16/2019 Document Reviewed: 11/16/2019 Elsevier Patient Education  2022 [employer].

## 2021-05-21 NOTE — Assessment & Plan Note (Addendum)
Orange size R inguinal hernia>L, a little tender on the R Surgery was advised - ref to Gen Surgery Get a bandage/hernia truss Hire help for yard care

## 2021-05-21 NOTE — Assessment & Plan Note (Signed)
Rotator cuff tear Ortho f/u Hire help for yard care

## 2021-05-21 NOTE — Assessment & Plan Note (Signed)
Cont on Eliquis- 

## 2021-05-21 NOTE — Assessment & Plan Note (Signed)
On Metformin and Jardiance

## 2021-05-21 NOTE — Progress Notes (Signed)
Subjective:  Patient ID: [name] White, male    DOB: June 24, 1935  Age: 86 y.o. MRN: [id number]  CC: Muscle Pain (Groin pain x 5-6 weeks progressively worse)   HPI [name] White presents for R groin pain x 6 wks after lifting a 40 lbs bag and twisting  C/o chronic pain in the R shoulder. Now is c/o R biceps bruise F/u DM  Outpatient Medications Prior to Visit  Medication Sig Dispense Refill   amoxicillin (AMOXIL) 500 MG tablet Take 4 capsules (2,000 mg) one hour prior to all dental visits. 8 tablet 11   apixaban (ELIQUIS) 2.5 MG TABS tablet Take 1 tablet (2.5 mg total) by mouth 2 (two) times daily. 180 tablet 3   Brinzolamide-Brimonidine (SIMBRINZA) 1-0.2 % SUSP Apply to eye.     CINNAMON PO Take 2,000 mg by mouth 2 (two) times daily.      clobetasol (TEMOVATE) 0.05 % external solution Apply 1 application topically. Applies 3 times a day for bumps in scalp.     cloNIDine (CATAPRES) 0.1 MG tablet TAKE 1 TABLET AS NEEDED FOR SYSTOLIC BLOOD PRESSURE GREATER THAN 160 90 tablet 1   Coenzyme Q10 ([employer] COQ-10) 200 MG capsule Take 200 mg by mouth every evening.      empagliflozin (JARDIANCE) 25 MG TABS tablet Take 25 mg by mouth daily. Take 1/2 tablet in the morning.     ergocalciferol (VITAMIN D2) 1.25 MG (50000 UT) capsule Take 50,000 Units by mouth every Monday.     famotidine (PEPCID) 20 MG tablet Take 20 mg by mouth 2 (two) times daily.     ferrous sulfate 325 (65 FE) MG tablet Take 325 mg by mouth every evening.      finasteride (PROSCAR) 5 MG tablet Take 5 mg by mouth every evening.     levothyroxine (SYNTHROID) 200 MCG tablet Take 200 mcg by mouth daily before breakfast.     loratadine (CLARITIN) 10 MG tablet Take 1 tablet (10 mg total) by mouth daily. 90 tablet 3   metFORMIN (GLUCOPHAGE) 500 MG tablet Take 500 mg by mouth 2 (two) times daily with a meal.     nilotinib (TASIGNA) 150 MG capsule Take 300 mg by mouth 2 (two) times daily. Take on an empty stomach, 1 hr  before or 2 hrs after food.     PARoxetine (PAXIL) 40 MG tablet Take 1 tablet (40 mg total) by mouth every morning. 90 tablet 1   rosuvastatin (CRESTOR) 10 MG tablet Take 1 tablet (10 mg total) by mouth daily. Please schedule an appointment for further refills 30 tablet 0   spironolactone (ALDACTONE) 25 MG tablet Take 12.5 mg by mouth daily.     traZODone (DESYREL) 50 MG tablet Take 50 mg by mouth at bedtime.     TURMERIC PO Take 1,000 mg by mouth every evening.      potassium chloride SA (KLOR-CON M20) 20 MEQ tablet Take 3 tablets (60 mEq total) by mouth daily for 3 days, THEN 1 tablet (20 mEq total) daily. 34 tablet 3   torsemide (DEMADEX) 20 MG tablet Take 2 tablets (40 mg total) by mouth 2 (two) times daily for 2 days, THEN 1 tablet (20 mg total) daily. 38 tablet 6   No facility-administered medications prior to visit.    ROS: Review of Systems  Constitutional:  Negative for appetite change, fatigue and unexpected weight change.  HENT:  Negative for congestion, nosebleeds, sneezing, sore throat and trouble swallowing.   Eyes:  Negative for itching and visual disturbance.  Respiratory:  Negative for cough.   Cardiovascular:  Negative for chest pain, palpitations and leg swelling.  Gastrointestinal:  Positive for abdominal distention and abdominal pain. Negative for blood in stool, diarrhea and nausea.  Genitourinary:  Negative for frequency and hematuria.  Musculoskeletal:  Positive for arthralgias and gait problem. Negative for back pain, joint swelling and neck pain.  Skin:  Negative for rash.  Neurological:  Negative for dizziness, tremors, speech difficulty and weakness.  Psychiatric/Behavioral:  Negative for agitation, dysphoric mood, sleep disturbance and suicidal ideas. The patient is not nervous/anxious.    Objective:  BP 118/72    Pulse 96    Temp 98.5 F (36.9 C) (Oral)    Ht 6\' 2"  (1.88 m)    Wt 219 lb 2 oz (99.4 kg)    SpO2 96%    BMI 28.13 kg/m   BP Readings from  Last 3 Encounters:  05/21/21 118/72  01/17/21 122/80  11/15/20 118/70    Wt Readings from Last 3 Encounters:  05/21/21 219 lb 2 oz (99.4 kg)  01/17/21 216 lb 9.6 oz (98.2 kg)  11/08/20 214 lb 3.2 oz (97.2 kg)    Physical Exam Constitutional:      General: He is not in acute distress.    Appearance: Normal appearance. He is well-developed.     Comments: NAD  Eyes:     Conjunctiva/sclera: Conjunctivae normal.     Pupils: Pupils are equal, round, and reactive to light.  Neck:     Thyroid: No thyromegaly.     Vascular: No JVD.  Cardiovascular:     Rate and Rhythm: Normal rate and regular rhythm.     Heart sounds: Normal heart sounds. No murmur heard.   No friction rub. No gallop.  Pulmonary:     Effort: Pulmonary effort is normal. No respiratory distress.     Breath sounds: Normal breath sounds. No wheezing or rales.  Chest:     Chest wall: No tenderness.  Abdominal:     General: Bowel sounds are normal. There is no distension.     Palpations: Abdomen is soft. There is no mass.     Tenderness: There is abdominal tenderness. There is no guarding or rebound.     Hernia: A hernia is present.  Musculoskeletal:        General: Tenderness present. Normal range of motion.     Cervical back: Normal range of motion.  Lymphadenopathy:     Cervical: No cervical adenopathy.  Skin:    General: Skin is warm and dry.     Findings: No rash.  Neurological:     Mental Status: He is alert and oriented to person, place, and time.     Cranial Nerves: No cranial nerve deficit.     Motor: No abnormal muscle tone.     Coordination: Coordination normal.     Gait: Gait normal.     Deep Tendon Reflexes: Reflexes are normal and symmetric.  Psychiatric:        Behavior: Behavior normal.        Thought Content: Thought content normal.        Judgment: Judgment normal.  Orange size R inguinal hernia>L, a little tender on the R R shoulder - pain w/ROM    A total time of 45 minutes was spent  preparing to see the patient, reviewing tests, x-rays, operative reports and other medical records.  Also, obtaining history and performing comprehensive physical exam.  Additionally, counseling the patient regarding the above listed issues - hernia, cancer, blood thinners.   Finally, documenting clinical information in the health records, coordination of care, educating the patient. It is a complex case.   Lab Results  Component Value Date   WBC 5.6 11/08/2020   HGB 13.5 11/08/2020   HCT 40.7 11/08/2020   PLT 193 11/08/2020   GLUCOSE 220 (H) 11/17/2020   CHOL 171 01/22/2018   TRIG 98 01/22/2018   HDL 57 01/22/2018   LDLDIRECT 141.6 11/12/2010   LDLCALC 94 01/22/2018   ALT 22 07/14/2019   AST 40 (H) 07/14/2019   NA 132 (L) 11/17/2020   K 4.9 11/17/2020   CL 100 11/17/2020   CREATININE 1.55 (H) 11/17/2020   BUN 44 (H) 11/17/2020   CO2 24 11/17/2020   TSH 1.25 07/14/2019   PSA 1.42 09/13/2013   INR 1.1 04/09/2019   HGBA1C 6.3 07/14/2019   MICROALBUR 6.0 (H) 01/10/2017    ECHOCARDIOGRAM COMPLETE  Result Date: 01/17/2021    ECHOCARDIOGRAM REPORT   Patient Name:   TAVIO BIEGEL Date of Exam: 01/17/2021 Medical Rec #:  [id number]        Height:       74.0 in Accession #:    [id number]       Weight:       214.2 lb Date of Birth:  1935/07/02        BSA:          2.238 m Patient Age:    17 years         BP:           156/89 mmHg Patient Gender: M                HR:           86 bpm. Exam Location:  Outpatient Procedure: 2D Echo, Color Doppler and Cardiac Doppler Indications:    Z12.45 Chronic diastolic (congestive) heart failure  History:        Patient has prior history of Echocardiogram examinations, most                 recent 04/12/2020. CHF, Arrythmias:Atrial Fibrillation; Risk                 Factors:Hypertension, Diabetes, Dyslipidemia and Sleep Apnea.                 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  Sonographer:    Raquel Sarna Senior RDCS Referring Phys: [id number] DANIEL R BENSIMHON  IMPRESSIONS  1. Left ventricular ejection fraction, by estimation, is 60 to 65%. The left ventricle has normal function. The left ventricle has no regional wall motion abnormalities. Left ventricular diastolic function could not be evaluated.  2. Right ventricular systolic function is normal. The right ventricular size is normal. There is normal pulmonary artery systolic pressure.  3. Left atrial size was severely dilated.  4. Right atrial size was severely dilated.  5. The mitral valve is normal in structure. No evidence of mitral valve regurgitation. No evidence of mitral stenosis.  6. The aortic valve has been repaired/replaced. There is mild calcification of the aortic valve. Aortic valve regurgitation is not visualized. No aortic stenosis is present. Echo findings are consistent with normal structure and function of the aortic valve prosthesis. Aortic valve area, by VTI measures 1.49 cm. Aortic valve mean gradient measures 14.0 mmHg. Aortic valve Vmax measures 2.45 m/s.  7. The inferior vena cava is normal  in size with greater than 50% respiratory variability, suggesting right atrial pressure of 3 mmHg. FINDINGS  Left Ventricle: Left ventricular ejection fraction, by estimation, is 60 to 65%. The left ventricle has normal function. The left ventricle has no regional wall motion abnormalities. The left ventricular internal cavity size was normal in size. There is  no left ventricular hypertrophy. Left ventricular diastolic function could not be evaluated due to atrial fibrillation. Left ventricular diastolic function could not be evaluated. Right Ventricle: The right ventricular size is normal. No increase in right ventricular wall thickness. Right ventricular systolic function is normal. There is normal pulmonary artery systolic pressure. The tricuspid regurgitant velocity is 2.32 m/s, and  with an assumed right atrial pressure of 3 mmHg, the estimated right ventricular systolic pressure is 22.9 mmHg. Left  Atrium: Left atrial size was severely dilated. Right Atrium: Right atrial size was severely dilated. Pericardium: There is no evidence of pericardial effusion. Mitral Valve: The mitral valve is normal in structure. Mild mitral annular calcification. No evidence of mitral valve regurgitation. No evidence of mitral valve stenosis. Tricuspid Valve: The tricuspid valve is normal in structure. Tricuspid valve regurgitation is trivial. No evidence of tricuspid stenosis. Aortic Valve: The aortic valve has been repaired/replaced. There is mild calcification of the aortic valve. Aortic valve regurgitation is not visualized. No aortic stenosis is present. Aortic valve mean gradient measures 14.0 mmHg. Aortic valve peak gradient measures 24.0 mmHg. Aortic valve area, by VTI measures 1.49 cm. There is a Sapien prosthetic, stented (TAVR) valve present in the aortic position. Echo findings are consistent with normal structure and function of the aortic valve prosthesis. Pulmonic Valve: The pulmonic valve was grossly normal. Pulmonic valve regurgitation is mild. No evidence of pulmonic stenosis. Aorta: The aortic root is normal in size and structure. Venous: The inferior vena cava is normal in size with greater than 50% respiratory variability, suggesting right atrial pressure of 3 mmHg. IAS/Shunts: No atrial level shunt detected by color flow Doppler.  LEFT VENTRICLE PLAX 2D LVIDd:         3.70 cm   Diastology LVIDs:         1.70 cm   LV e' medial:    9.25 cm/s LV PW:         1.10 cm   LV E/e' medial:  12.6 LV IVS:        1.10 cm   LV e' lateral:   9.03 cm/s LVOT diam:     2.30 cm   LV E/e' lateral: 13.0 LV SV:         73 LV SV Index:   33 LVOT Area:     4.15 cm  RIGHT VENTRICLE RV S prime:     7.45 cm/s TAPSE (M-mode): 1.9 cm LEFT ATRIUM              Index        RIGHT ATRIUM           Index LA diam:        5.50 cm  2.46 cm/m   RA Area:     26.20 cm LA Vol (A2C):   90.2 ml  40.31 ml/m  RA Volume:   77.40 ml  34.59 ml/m LA  Vol (A4C):   121.0 ml 54.07 ml/m LA Biplane Vol: 107.0 ml 47.82 ml/m  AORTIC VALVE AV Area (Vmax):    1.80 cm AV Area (Vmean):   1.48 cm AV Area (VTI):     1.49 cm AV  Vmax:           245.00 cm/s AV Vmean:          174.000 cm/s AV VTI:            0.492 m AV Peak Grad:      24.0 mmHg AV Mean Grad:      14.0 mmHg LVOT Vmax:         106.00 cm/s LVOT Vmean:        62.000 cm/s LVOT VTI:          0.176 m LVOT/AV VTI ratio: 0.36  AORTA Ao Root diam: 3.00 cm Ao Asc diam:  3.60 cm MITRAL VALVE                TRICUSPID VALVE MV Area (PHT): 4.49 cm     TR Peak grad:   21.5 mmHg MV Decel Time: 169 msec     TR Vmax:        232.00 cm/s MV E velocity: 117.00 cm/s MV A velocity: 58.20 cm/s   SHUNTS MV E/A ratio:  2.01         Systemic VTI:  0.18 m                             Systemic Diam: 2.30 cm Glori Bickers MD Electronically signed by Glori Bickers MD Signature Date/Time: 01/17/2021/3:31:14 PM    Final     Assessment & Plan:   Problem List Items Addressed This Visit     Diabetes mellitus type 2 in obese (HCC) (Chronic)    On Metformin and Jardiance       Chronic atrial fibrillation (HCC)    Cont on Eliquis      Inguinal hernia    Orange size R inguinal hernia>L, a little tender on the R Surgery was advised - ref to Gen Surgery Get a bandage/hernia truss Hire help for yard care      Relevant Orders   Ambulatory referral to General Surgery   Shoulder pain, right    Rotator cuff tear Ortho f/u Hire help for yard care         No orders of the defined types were placed in this encounter.     Follow-up: Return for a follow-up visit.  Walker Kehr, MD

## 2021-06-13 ENCOUNTER — Ambulatory Visit: Payer: Self-pay | Admitting: General Surgery

## 2021-06-13 ENCOUNTER — Telehealth: Payer: Self-pay | Admitting: *Deleted

## 2021-06-13 DIAGNOSIS — H9190 Unspecified hearing loss, unspecified ear: Secondary | ICD-10-CM | POA: Insufficient documentation

## 2021-06-13 DIAGNOSIS — I5032 Chronic diastolic (congestive) heart failure: Secondary | ICD-10-CM | POA: Diagnosis not present

## 2021-06-13 DIAGNOSIS — Z7902 Long term (current) use of antithrombotics/antiplatelets: Secondary | ICD-10-CM

## 2021-06-13 DIAGNOSIS — L0390 Cellulitis, unspecified: Secondary | ICD-10-CM | POA: Insufficient documentation

## 2021-06-13 DIAGNOSIS — K4020 Bilateral inguinal hernia, without obstruction or gangrene, not specified as recurrent: Secondary | ICD-10-CM | POA: Diagnosis not present

## 2021-06-13 DIAGNOSIS — E1169 Type 2 diabetes mellitus with other specified complication: Secondary | ICD-10-CM | POA: Diagnosis not present

## 2021-06-13 DIAGNOSIS — Z952 Presence of prosthetic heart valve: Secondary | ICD-10-CM | POA: Diagnosis not present

## 2021-06-13 DIAGNOSIS — I482 Chronic atrial fibrillation, unspecified: Secondary | ICD-10-CM | POA: Diagnosis not present

## 2021-06-13 DIAGNOSIS — D485 Neoplasm of uncertain behavior of skin: Secondary | ICD-10-CM | POA: Insufficient documentation

## 2021-06-13 DIAGNOSIS — L03031 Cellulitis of right toe: Secondary | ICD-10-CM | POA: Insufficient documentation

## 2021-06-13 DIAGNOSIS — M25572 Pain in left ankle and joints of left foot: Secondary | ICD-10-CM | POA: Insufficient documentation

## 2021-06-13 DIAGNOSIS — D72829 Elevated white blood cell count, unspecified: Secondary | ICD-10-CM | POA: Insufficient documentation

## 2021-06-13 DIAGNOSIS — D75839 Thrombocytosis, unspecified: Secondary | ICD-10-CM | POA: Insufficient documentation

## 2021-06-13 DIAGNOSIS — L708 Other acne: Secondary | ICD-10-CM | POA: Insufficient documentation

## 2021-06-13 DIAGNOSIS — M19271 Secondary osteoarthritis, right ankle and foot: Secondary | ICD-10-CM | POA: Insufficient documentation

## 2021-06-13 DIAGNOSIS — C9210 Chronic myeloid leukemia, BCR/ABL-positive, not having achieved remission: Secondary | ICD-10-CM | POA: Diagnosis not present

## 2021-06-13 DIAGNOSIS — E1129 Type 2 diabetes mellitus with other diabetic kidney complication: Secondary | ICD-10-CM | POA: Insufficient documentation

## 2021-06-13 DIAGNOSIS — H35443 Age-related reticular degeneration of retina, bilateral: Secondary | ICD-10-CM | POA: Insufficient documentation

## 2021-06-13 NOTE — Telephone Encounter (Signed)
? ?  Pre-operative Risk Assessment  ?  ?Patient Name: [name] White  ?DOB: 07-21-1935 ?MRN: [id number]  ? ?  ? ?Request for Surgical Clearance   ? ?Procedure:   HERNIA REPAIR ? ?Date of Surgery:  Clearance TBD                              ?   ?Surgeon:  Greer Pickerel, MD ?Surgeon's Group or Practice Name:  [facility] ?Phone number:  [phone] ?Fax number:  [phone] ATTN:  [facility] ?  ?Type of Clearance Requested:   ?- Pharmacy:  Hold Apixaban (Eliquis) NOT INDICATED ?  ?Type of Anesthesia:  General  ?  ?Additional requests/questions:   ? ?Signed, ?Jeanann Lewandowsky   ?06/13/2021, 11:16 AM  ? ?

## 2021-06-13 NOTE — Telephone Encounter (Signed)
Clinical pharmacist to review Eliquis 

## 2021-06-13 NOTE — Telephone Encounter (Signed)
Patient with diagnosis of afib on Eliquis for anticoagulation.   ? ?Procedure: hernia repair ?Date of procedure: TBD ? ?CHA2DS2-VASc Score = 6  ?This indicates a 9.7% annual risk of stroke. ?The patient's score is based upon: ?CHF History: 1 ?HTN History: 1 ?Diabetes History: 1 ?Stroke History: 0 ?Vascular Disease History: 1 ?Age Score: 2 ?Gender Score: 0 ?  ?CrCl 18m/min ?Platelet count 193K ? ?Per office protocol, patient can hold Eliquis for 2 days prior to procedure. ?

## 2021-06-14 ENCOUNTER — Ambulatory Visit (INDEPENDENT_AMBULATORY_CARE_PROVIDER_SITE_OTHER): Payer: Medicare Other | Admitting: Student

## 2021-06-14 ENCOUNTER — Encounter: Payer: Self-pay | Admitting: Student

## 2021-06-14 ENCOUNTER — Telehealth: Payer: Self-pay | Admitting: *Deleted

## 2021-06-14 DIAGNOSIS — Z01810 Encounter for preprocedural cardiovascular examination: Secondary | ICD-10-CM | POA: Diagnosis not present

## 2021-06-14 NOTE — Telephone Encounter (Signed)
Pre-op covering staff, patient will need a telehealth visit to complete pre-op evaluation. Can you please call and consent patient for this and add to an APP pre-op schedule? ? ?Thank you! ?Darreld Mclean, PA-C ?06/14/2021 9:06 AM ? ?

## 2021-06-14 NOTE — Telephone Encounter (Signed)
?  Patient Consent for Virtual Visit  ? ? ?   ? ?[name] has provided verbal consent on 06/14/2021 for a virtual visit (video or telephone). ? ? ?CONSENT FOR VIRTUAL VISIT FOR:  [name] White  ?By participating in this virtual visit I agree to the following: ? ?I hereby voluntarily request, consent and authorize [facility] and its employed or contracted physicians, physician assistants, nurse practitioners or other licensed health care professionals (the Practitioner), to provide me with telemedicine health care services (the ?Services") as deemed necessary by the treating Practitioner. I acknowledge and consent to receive the Services by the Practitioner via telemedicine. I understand that the telemedicine visit will involve communicating with the Practitioner through live audiovisual communication technology and the disclosure of certain medical information by electronic transmission. I acknowledge that I have been given the opportunity to request an in-person assessment or other available alternative prior to the telemedicine visit and am voluntarily participating in the telemedicine visit. ? ?I understand that I have the right to withhold or withdraw my consent to the use of telemedicine in the course of my care at any time, without affecting my right to future care or treatment, and that the Practitioner or I may terminate the telemedicine visit at any time. I understand that I have the right to inspect all information obtained and/or recorded in the course of the telemedicine visit and may receive copies of available information for a reasonable fee.  I understand that some of the potential risks of receiving the Services via telemedicine include:  ?Delay or interruption in medical evaluation due to technological equipment failure or disruption; ?Information transmitted may not be sufficient (e.g. poor resolution of images) to allow for appropriate medical decision making by the Practitioner;  and/or  ?In rare instances, security protocols could fail, causing a breach of personal health information. ? ?Furthermore, I acknowledge that it is my responsibility to provide information about my medical history, conditions and care that is complete and accurate to the best of my ability. I acknowledge that Practitioner's advice, recommendations, and/or decision may be based on factors not within their control, such as incomplete or inaccurate data provided by me or distortions of diagnostic images or specimens that may result from electronic transmissions. I understand that the practice of medicine is not an exact science and that Practitioner makes no warranties or guarantees regarding treatment outcomes. I acknowledge that a copy of this consent can be made available to me via my patient portal ([facility]), or I can request a printed copy by calling the office of [facility].   ? ?I understand that my insurance will be billed for this visit.  ? ?I have read or had this consent read to me. ?I understand the contents of this consent, which adequately explains the benefits and risks of the Services being provided via telemedicine.  ?I have been provided ample opportunity to ask questions regarding this consent and the Services and have had my questions answered to my satisfaction. ?I give my informed consent for the services to be provided through the use of telemedicine in my medical care ? ? ? ?

## 2021-06-14 NOTE — Telephone Encounter (Signed)
I s/w the pt and he is agreeable to plan of care for tele appt today at 3 pm. Med rec and consent done.  ?

## 2021-06-14 NOTE — Progress Notes (Signed)
? ?Virtual Visit via Telephone Note  ? ?This visit type was conducted due to national recommendations for restrictions regarding the COVID-19 Pandemic (e.g. social distancing) in an effort to limit this patient's exposure and mitigate transmission in our community.  Due to his co-morbid illnesses, this patient is at least at moderate risk for complications without adequate follow up.  This format is felt to be most appropriate for this patient at this time.  The patient did not have access to video technology/had technical difficulties with video requiring transitioning to audio format only (telephone).  All issues noted in this document were discussed and addressed.  No physical exam could be performed with this format.  Please refer to the patient's chart for his  consent to telehealth for [facility]. ?Evaluation Performed:  Preoperative cardiovascular risk assessment ? ?This visit type was conducted due to national recommendations for restrictions regarding the COVID-19 Pandemic (e.g. social distancing).  This format is felt to be most appropriate for this patient at this time.  All issues noted in this document were discussed and addressed.  No physical exam was performed (except for noted visual exam findings with Video Visits).  Please refer to the patient's chart (MyChart message for video visits and phone note for telephone visits) for the patient's consent to telehealth for [facility]. ?_____________  ? ?Date:  06/14/2021  ? ?Patient ID:  Ademide Schaberg, DOB 06-03-1935, MRN [id number] ?Patient Location:  ?Home ?Provider location:   ?Office ? ?Primary Care Provider:  Cassandria Anger, MD ?Primary Cardiologist: Dr. Haroldine Laws  ? ?Chief Complaint  ?  ?Dae Highley is a 86 y.o. male with a history of mild non-obstructive CAD on cardiac catheterization in 83/6629, chronic diastolic CHF, permanent atrial fibrillation on Eliquis, paroxysmal atrial flutter s/p ablation in 2010, severe aortic  stenosis s/p TAVR in 04/2019, mild carotid stenosis, micturition syncope, hypertension, hyperlipidemia, type 2 diabetes, CKD stage IIIb obstructive sleep apnea, obesity, and CML who is pending a hernia repair and presents today for telephonic preoperative cardiovascular risk assessment. ? ?Past Medical History  ?  ?Past Medical History:  ?Diagnosis Date  ? Anemia 06/04/2015  ? Anxiety   ? Atrial flutter ([place])   ? s/p ablation 07/17/08  ? Cancer [facility])   ? leukemia - dx 05/2018  ? Chronic diastolic CHF (congestive heart failure) ([place])   ? Coronary artery disease   ? Depression   ? Diabetes mellitus type 2 in obese ([place]) 04/10/2009  ? Qualifier: Diagnosis of  By: Arnoldo Morale MD, Balinda Quails, diet controlled, lost 60lbs  ? Emphysema of lung ([place])   ? GERD (gastroesophageal reflux disease)   ? Glaucoma   ? recent dx - has an appt to be evaluated and meds to treat  ? H/O hiatal hernia   ? Hearing loss   ? some hearing loss in left ear but has not been dx, no hearing aids  ? Hepatitis   ? h/o of type A  ? History of measles as a child   ? History of mumps as a child   ? Hypertension   ? Hypothyroidism   ? Micturition syncope   ? Osteoarthritis   ? Pleural effusion, bilateral   ? 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 26 mm Edwards Sapien 3 transcatheter heart valve placed via percutaneous right transfemoral approach   ? Severe aortic stenosis   ? Sleep apnea   ? uses CPAP nightly  ? Vitamin D deficiency 11/24/2015  ? ?  Past Surgical History:  ?Procedure Laterality Date  ? CARDIAC CATHETERIZATION  03/18/2019  ? CARDIAC ELECTROPHYSIOLOGY MAPPING AND ABLATION  06/2008  ? CATARACT EXTRACTION W/ INTRAOCULAR LENS  IMPLANT, BILATERAL  ~ 2007  ? COLONOSCOPY    ? FOOT TENDON SURGERY Left   ? INTRAOPERATIVE TRANSTHORACIC ECHOCARDIOGRAM N/A 04/13/2019  ? Procedure: TRANSTHORACIC ECHOCARDIOGRAM;  Surgeon: Sherren Mocha, MD;  Location: [place];  Service: Open Heart Surgery;  Laterality: N/A;  ? RIGHT/LEFT HEART CATH AND CORONARY  ANGIOGRAPHY N/A 03/18/2019  ? Procedure: RIGHT/LEFT HEART CATH AND CORONARY ANGIOGRAPHY;  Surgeon: Jolaine Artist, MD;  Location: [place] CV LAB;  Service: Cardiovascular;  Laterality: N/A;  ? TONSILLECTOMY  1940's  ? TOTAL KNEE ARTHROPLASTY  1990's  ? right  ? TOTAL KNEE ARTHROPLASTY Left 08/02/2013  ? Procedure: LEFT TOTAL KNEE ARTHROPLASTY;  Surgeon: Gearlean Alf, MD;  [facility];  Service: Orthopedics;  Laterality: Left;  ? TRANSCATHETER AORTIC VALVE REPLACEMENT, TRANSFEMORAL N/A 04/13/2019  ? Procedure: TRANSCATHETER AORTIC VALVE REPLACEMENT, TRANSFEMORAL;  Surgeon: Sherren Mocha, MD;  Location: [place];  Service: Open Heart Surgery;  Laterality: N/A;  ? UPPER GI ENDOSCOPY    ? WISDOM TOOTH EXTRACTION    ? ? ?Allergies ? ?Allergies  ?Allergen Reactions  ? Ace Inhibitors Anaphylaxis and Swelling  ?  Face and lip swelling  ? Benazepril Anaphylaxis and Swelling  ?  Face and lip swelling  ? Codeine Itching and Rash  ? Statins Other (See Comments)  ?  Made very sick, doctor thought had heart attack; "my appearance, color, etc". Tolerating a low dose currently  ? Piroxicam Hives  ? Pseudoephedrine Other (See Comments)  ?  Can't take because it interacts with dofetilide   ? Dasatinib   ?  Hoarsness, weakness, scrotal swelling  ? Betadine [Povidone Iodine] Hives and Rash  ? ? ?History of Present Illness  ?  ?Padraic Marinos is a 86 y.o. male who presents via audio/video conferencing for a telehealth visit today.  Patient was last seen in cardiology clinic on 1019/2022 by Dr. Haroldine Laws. At that time, Tivon Lemoine was doing well from a cardiac standpoint but was struggling with severe right ankle pain. He is now pending a hernia repair. Since his last visit, he has been doing well. He denies any chest pain, shortness of breath, orthopnea, PND, palpitations, lightheadedness/dizziness, or syncope. He is able to complete >4.0 METS. He stays active working in the yard and walking his dog and has no chest  pain or shortness of breath with these activities.  ? ?Home Medications  ?  ?Prior to Admission medications   ?Medication Sig Start Date End Date Taking? Authorizing Provider  ?amLODipine (NORVASC) 10 MG tablet Take 10 mg by mouth daily. 05/25/21   [provider]  ?amoxicillin (AMOXIL) 500 MG tablet Take 4 capsules (2,000 mg) one hour prior to all dental visits. 04/21/19   Eileen Stanford, PA-C  ?apixaban (ELIQUIS) 2.5 MG TABS tablet Take 1 tablet (2.5 mg total) by mouth 2 (two) times daily. 11/16/20   Bensimhon, Shaune Pascal, MD  ?Brinzolamide-Brimonidine [employer]) 1-0.2 % SUSP Apply to eye.    [provider]  ?CINNAMON PO Take 2,000 mg by mouth 2 (two) times daily.     [provider]  ?clobetasol (TEMOVATE) 0.05 % external solution Apply 1 application topically. Applies 3 times a day for bumps in scalp.    [provider]  ?cloNIDine (CATAPRES) 0.1 MG tablet TAKE 1 TABLET  AS NEEDED FOR SYSTOLIC BLOOD PRESSURE GREATER THAN 160 09/01/19   Bensimhon, Shaune Pascal, MD  ?Coenzyme Q10 ([employer]S COQ-10) 200 MG capsule Take 200 mg by mouth every evening.     [provider]  ?empagliflozin (JARDIANCE) 25 MG TABS tablet Take 25 mg by mouth daily. Take 1/2 tablet in the morning.    [provider]  ?ergocalciferol (VITAMIN D2) 1.25 MG (50000 UT) capsule Take 50,000 Units by mouth every Monday.    [provider]  ?famotidine (PEPCID) 20 MG tablet Take 20 mg by mouth as needed (TAKES 2 HOURS AFTER CANCER TREATMENT).    [provider]  ?ferrous sulfate 325 (65 FE) MG tablet Take 325 mg by mouth every evening.     [provider]  ?finasteride (PROSCAR) 5 MG tablet Take 5 mg by mouth every evening.    [provider]  ?levothyroxine (SYNTHROID) 200 MCG tablet Take 200 mcg by mouth daily before breakfast.    [provider]  ?loratadine (CLARITIN) 10 MG tablet Take 1 tablet (10 mg total) by mouth daily. 11/15/20   Plotnikov, Evie Lacks, MD   ?metFORMIN (GLUCOPHAGE) 500 MG tablet Take 500 mg by mouth 2 (two) times daily with a meal.    [provider]  ?nilotinib (TASIGNA) 150 MG capsule Take 300 mg by mouth 2 (two) times daily. Take on

## 2021-07-08 ENCOUNTER — Ambulatory Visit: Payer: Self-pay | Admitting: General Surgery

## 2021-08-03 NOTE — Patient Instructions (Addendum)
DUE TO COVID-19 ONLY TWO VISITORS  (aged 84 and older)  ARE ALLOWED TO COME WITH YOU AND STAY IN THE WAITING ROOM ONLY DURING PRE OP AND PROCEDURE.   ?**NO VISITORS ARE ALLOWED IN THE SHORT STAY AREA OR RECOVERY ROOM!!** ? ?IF YOU WILL BE ADMITTED INTO THE HOSPITAL YOU ARE ALLOWED ONLY FOUR SUPPORT PEOPLE DURING VISITATION HOURS ONLY (7 AM -8PM)   ?The support person(s) must pass our screening, gel in and out, and wear a mask at all times, including in the patient?s room. ?Patients must also wear a mask when staff or their support person are in the room. ?Visitors GUEST BADGE MUST BE WORN VISIBLY  ?One adult visitor may remain with you overnight and MUST be in the room by 8 P.M. ?  ? ? Your procedure is scheduled on: 08/16/21 ? ? Report to [facility] Main Entrance ? ?  Report to admitting at 11:45 AM ? ? Call this number if you have problems the morning of surgery [phone] ? ? Do not eat food :After Midnight. ? ? After Midnight you may have the following liquids until __11:00____ AM/ DAY OF SURGERY ? ?Water ?Black Coffee (sugar ok, NO MILK/CREAM OR CREAMERS)  ?Tea (sugar ok, NO MILK/CREAM OR CREAMERS) regular and decaf                             ?Plain Jell-O (NO RED)                                           ?Fruit ices (not with fruit pulp, NO RED)                                     ?Popsicles (NO RED)                                                                  ?Juice: apple, WHITE grape, WHITE cranberry ?Sports drinks like Gatorade (NO RED) ?Clear broth(vegetable,chicken,beef) ? ?             ?                                             Or sugar free gatoraid ?  ?  ?The day of surgery:  ?Drink ONE (1)  G2 at AM the morning of surgery. Drink in one sitting. Do not sip.  ?This drink was given to you during your hospital  ?pre-op appointment visit. ?Nothing else to drink after completing the  ? G2. At 11:00 ?  ?       If you have questions, please contact your surgeon?s office. ? ? ?FOLLOW BOWEL  PREP AND ANY ADDITIONAL PRE OP INSTRUCTIONS YOU RECEIVED FROM YOUR SURGEON'S OFFICE!!! ?  ?  ?Oral Hygiene is also important to reduce your risk of infection.                                    ?  Remember - BRUSH YOUR TEETH THE MORNING OF SURGERY WITH YOUR REGULAR TOOTHPASTE ? ? Do NOT smoke after Midnight ? ? Take these medicines the morning of surgery with A SIP OF WATER: Paxil, Amlodipine, Levothyroxine,Pepcid ? ? Before surgery.Stop taking _eliquis__________on __5/14 is last dose________as instructed by _____________. ? ?Stop taking ____________as directed by your Surgeon/Cardiologist. ? ?Contact your Surgeon/Cardiologist for instructions on Anticoagulant Therapy prior to surgery. ?  ? ?Bring CPAP mask and tubing day of surgery. ?                  ?           You may not have any metal on your body including  jewelry, and body piercing ? ?           Do not wear lotions, powders, perfumes/cologne, or deodorant ? ? ?            Men may shave face and neck. ? ? Do not bring valuables to the hospital. [place] NOT ?            RESPONSIBLE   FOR VALUABLES. ? ? Contacts, dentures or bridgework may not be worn into surgery. ? ?  ? Patients discharged on the day of surgery will not be allowed to drive home.  Someone NEEDS to stay with you for the first 24 hours after anesthesia. ? ? Special Instructions: Bring a copy of your healthcare power of attorney and living will documents  the day of surgery if you haven't scanned them before. ? ?            Please read over the following fact sheets you were given: IF [place] [phone] ? ?   [place] - Preparing for Surgery ?Before surgery, you can play an important role.  Because skin is not sterile, your skin needs to be as free of germs as possible.  You can reduce the number of germs on your skin by washing with CHG (chlorahexidine gluconate) soap before surgery.  CHG is an antiseptic cleaner which kills germs  and bonds with the skin to continue killing germs even after washing. ?Please DO NOT use if you have an allergy to CHG or antibacterial soaps.  If your skin becomes reddened/irritated stop using the CHG and inform your nurse when you arrive at Short Stay. You may shave your face/neck. ?Please follow these instructions carefully: ? 1.  Shower with CHG Soap the night before surgery and the  morning of Surgery. ? 2.  If you choose to wash your hair, wash your hair first as usual with your  normal  shampoo. ? 3.  After you shampoo, rinse your hair and body thoroughly to remove the  shampoo.                       ?     4.  Use CHG as you would any other liquid soap.  You can apply chg directly  to the skin and wash  ?                     Gently with a scrungie or clean washcloth. ? 5.  Apply the CHG Soap to your body ONLY FROM THE NECK DOWN.   Do not use on face/ open      ?  Wound or open sores. Avoid contact with eyes, ears mouth and genitals (private parts).  ?                     Production manager,  Genitals (private parts) with your normal soap. ?            6.  Wash thoroughly, paying special attention to the area where your surgery  will be performed. ? 7.  Thoroughly rinse your body with warm water from the neck down. ? 8.  DO NOT shower/wash with your normal soap after using and rinsing off  the CHG Soap. ?               9.  Pat yourself dry with a clean towel. ?           10.  Wear clean pajamas. ?           11.  Place clean sheets on your bed the night of your first shower and do not  sleep with pets. ?Day of Surgery : ?Do not apply any lotions/deodorants the morning of surgery.  Please wear clean clothes to the hospital/surgery center. ? ?FAILURE TO FOLLOW THESE INSTRUCTIONS MAY RESULT IN THE CANCELLATION OF YOUR SURGERY ? ? ?________________________________________________________________________  ?

## 2021-08-06 ENCOUNTER — Encounter (HOSPITAL_COMMUNITY): Payer: Self-pay

## 2021-08-06 ENCOUNTER — Other Ambulatory Visit: Payer: Self-pay

## 2021-08-06 ENCOUNTER — Encounter (HOSPITAL_COMMUNITY)
Admission: RE | Admit: 2021-08-06 | Discharge: 2021-08-06 | Disposition: A | Discharge status: Home / Self Care | Payer: Medicare Other | Source: Ambulatory Visit | Attending: General Surgery | Admitting: General Surgery

## 2021-08-06 VITALS — BP 130/81 | HR 85 | Temp 98.0°F | Resp 20 | Ht 74.0 in | Wt 220.0 lb

## 2021-08-06 DIAGNOSIS — E119 Type 2 diabetes mellitus without complications: Secondary | ICD-10-CM | POA: Insufficient documentation

## 2021-08-06 DIAGNOSIS — Z01812 Encounter for preprocedural laboratory examination: Secondary | ICD-10-CM | POA: Diagnosis not present

## 2021-08-06 DIAGNOSIS — Z7902 Long term (current) use of antithrombotics/antiplatelets: Secondary | ICD-10-CM | POA: Diagnosis not present

## 2021-08-06 DIAGNOSIS — E669 Obesity, unspecified: Secondary | ICD-10-CM | POA: Diagnosis not present

## 2021-08-06 DIAGNOSIS — E1169 Type 2 diabetes mellitus with other specified complication: Secondary | ICD-10-CM

## 2021-08-06 LAB — BASIC METABOLIC PANEL
Anion gap: 8 (ref 5–15)
BUN: 27 mg/dL — ABNORMAL HIGH (ref 8–23)
CO2: 22 mmol/L (ref 22–32)
Calcium: 9.2 mg/dL (ref 8.9–10.3)
Chloride: 108 mmol/L (ref 98–111)
Creatinine, Ser: 1.05 mg/dL (ref 0.61–1.24)
GFR, Estimated: >60 mL/min (ref >60)
Glucose, Bld: 165 mg/dL — ABNORMAL HIGH (ref 70–99)
Potassium: 4.8 mmol/L (ref 3.5–5.1)
Sodium: 138 mmol/L (ref 135–145)

## 2021-08-06 LAB — CBC WITH DIFFERENTIAL/PLATELET
Abs Immature Granulocytes: 0.02 10*3/uL (ref 0.00–0.07)
Basophils Absolute: 0.1 10*3/uL (ref 0.0–0.1)
Basophils Relative: 1 %
Eosinophils Absolute: 0.3 10*3/uL (ref 0.0–0.5)
Eosinophils Relative: 4 %
HCT: 35.2 % — ABNORMAL LOW (ref 39.0–52.0)
Hemoglobin: 10.8 g/dL — ABNORMAL LOW (ref 13.0–17.0)
Immature Granulocytes: 0 %
Lymphocytes Relative: 24 %
Lymphs Abs: 1.6 10*3/uL (ref 0.7–4.0)
MCH: 25.6 pg — ABNORMAL LOW (ref 26.0–34.0)
MCHC: 30.7 g/dL (ref 30.0–36.0)
MCV: 83.4 fL (ref 80.0–100.0)
Monocytes Absolute: 0.6 10*3/uL (ref 0.1–1.0)
Monocytes Relative: 9 %
Neutro Abs: 4.1 10*3/uL (ref 1.7–7.7)
Neutrophils Relative %: 62 %
Platelets: 199 10*3/uL (ref 150–400)
RBC: 4.22 MIL/uL (ref 4.22–5.81)
RDW: 17.1 % — ABNORMAL HIGH (ref 11.5–15.5)
WBC: 6.7 10*3/uL (ref 4.0–10.5)
nRBC: 0 % (ref 0.0–0.2)

## 2021-08-06 LAB — GLUCOSE, CAPILLARY: Glucose-Capillary: 166 mg/dL — ABNORMAL HIGH (ref 70–99)

## 2021-08-06 LAB — HEMOGLOBIN A1C
Hgb A1c MFr Bld: 7.7 % — ABNORMAL HIGH (ref 4.8–5.6)
Mean Plasma Glucose: 174.29 mg/dL

## 2021-08-06 NOTE — Progress Notes (Signed)
Anesthesia note: ? ?Bowel prep reminder:NA ? ?PCP - Dr. Loni Muse. Plotnikov ?Cardiologist -Dr. Keturah Barre. bensimhon ?Other-  ? ?Chest x-ray - 11/08/20-epic ?EKG - 01/17/21-epic ?Stress Test - no ?ECHO - 01/17/21-epic ?Cardiac Cath - 2020 ? ?Pacemaker/ICD device last checked:NA ? ?Sleep Study - yes ?CPAP - no longer needs it . He had a 100 lb weight loss ? ? ?Fasting Blood Sugar - 1-2 week ?Checks Blood Sugar _110-130____ ? ?Blood Thinner:Eliquis for A-fib/ Dr/ Plotnikov ?Blood Thinner Instructions:stop 2 days prior to DOS ?Aspirin Instructions: ?Last Dose:08/13/21 ? ?Anesthesia review: yes ? ?Patient denies shortness of breath, fever, cough and chest pain at PAT appointment ?Pt has CML and is weak in the legs . He uses a cane and has not been able to exercise. ?He is taking eye drops for dry eyesfor a few weeks it is improving but her is sensitive to light. ? ?Patient verbalized understanding of instructions that were given to them at the PAT appointment. Patient was also instructed that they will need to review over the PAT instructions again at home before surgery. yes ?

## 2021-08-09 NOTE — Progress Notes (Signed)
Anesthesia Chart Review ? ? Case: [id number] Date/Time: 08/16/21 1150  ? Procedure: ROBOTIC VS LAPAROSCOPIC REPAIR BILATERAL INGUINAL HERNIAS WITH MESH (Bilateral)  ? Anesthesia type: General  ? Pre-op diagnosis: bilateral inguinal hernia  ? [facility]  ? Surgeons: Greer Pickerel, MD  ? ?  ? ? ?DISCUSSION:86 y.o. former smoker with h/o GERD, HTN, CHF, DM II, sleep apnea with cpap, s/p TAVR 04/13/2019, a-fib (on Eliquis), CKD Stage IIIb, CML, bilateral inguinal hernia scheduled for above procedure 08/16/2021 with Dr. Greer Pickerel.  ? ?Pt seen by cardiology 06/14/2021 for preoperative evaluation.  Per OV note, "Preoperative Cardiovascular Risk Assessment: ?Patient has an upcoming hernia repair planned. He is doing well from a cardiac standpoint with no chest pain, acute CHF symptoms, palpitations, or syncope. He is able to complete >4.0 METS of activity without any problems. Therefore, based on ACC/AHA guidelines, patient would be at acceptable risk for the planned procedure without further cardiovascular testing. Per Pharmacy and office protocol, patient can hold Eliquis for 2 days prior to procedure but please restart this as soon as safely possible afterwards. I will route this recommendation to the requesting party via Epic fax function." ? ?Anticipate pt can proceed with planned procedure barring acute status change.   ?VS: BP 130/81   Pulse 85   Temp 36.7 ?C (Oral)   Resp 20   Ht '6\' 2"'$  (1.88 m)   Wt 99.8 kg   SpO2 100%   BMI 28.25 kg/m?  ? ?PROVIDERS: ?Plotnikov, Evie Lacks, MD is PCP  ? ?Primary Cardiologist: Dr. Haroldine Laws  ?LABS: Labs reviewed: Acceptable for surgery. ?(all labs ordered are listed, but only abnormal results are displayed) ? ?Labs Reviewed  ?CBC WITH DIFFERENTIAL/PLATELET - Abnormal; Notable for the following components:  ?    Result Value  ? Hemoglobin 10.8 (*)   ? HCT 35.2 (*)   ? MCH 25.6 (*)   ? RDW 17.1 (*)   ? All other components within normal limits  ?BASIC METABOLIC  PANEL - Abnormal; Notable for the following components:  ? Glucose, Bld 165 (*)   ? BUN 27 (*)   ? All other components within normal limits  ?HEMOGLOBIN A1C - Abnormal; Notable for the following components:  ? Hgb A1c MFr Bld 7.7 (*)   ? All other components within normal limits  ?GLUCOSE, CAPILLARY - Abnormal; Notable for the following components:  ? Glucose-Capillary 166 (*)   ? All other components within normal limits  ?TYPE AND SCREEN  ? ? ? ?IMAGES: ? ? ?EKG: ?01/17/2021 ?Rate 86 bpm  ?Atrial fibrillation ?Left anterior fascicular block ?Abnormal ECG ?No significant change since ?11/08/20 ? ?CV: ?Echo 01/17/2021 ?1. Left ventricular ejection fraction, by estimation, is 60 to 65%. The  ?left ventricle has normal function. The left ventricle has no regional  ?wall motion abnormalities. Left ventricular diastolic function could not  ?be evaluated.  ? 2. Right ventricular systolic function is normal. The right ventricular  ?size is normal. There is normal pulmonary artery systolic pressure.  ? 3. Left atrial size was severely dilated.  ? 4. Right atrial size was severely dilated.  ? 5. The mitral valve is normal in structure. No evidence of mitral valve  ?regurgitation. No evidence of mitral stenosis.  ? 6. The aortic valve has been repaired/replaced. There is mild  ?calcification of the aortic valve. Aortic valve regurgitation is not  ?visualized. No aortic stenosis is present. Echo findings are consistent  ?with normal structure and  function of the aortic  ?valve prosthesis. Aortic valve area, by VTI measures 1.49 cm?[name] Aortic  ?valve mean gradient measures 14.0 mmHg. Aortic valve Vmax measures 2.45  ?m/s.  ? 7. The inferior vena cava is normal in size with greater than 50%  ?respiratory variability, suggesting right atrial pressure of 3 mmHg. ?Past Medical History:  ?Diagnosis Date  ? Anemia 06/04/2015  ? Anxiety   ? Atrial flutter ([place])   ? s/p ablation 07/17/08  ? Cancer [facility])   ? leukemia - dx 05/2018  ?  Chronic diastolic CHF (congestive heart failure) ([place])   ? Coronary artery disease   ? Depression   ? Diabetes mellitus type 2 in obese ([place]) 04/10/2009  ? Qualifier: Diagnosis of  By: Arnoldo Morale MD, Balinda Quails, diet controlled, lost 60lbs  ? Emphysema of lung ([place])   ? GERD (gastroesophageal reflux disease)   ? Glaucoma   ? recent dx - has an appt to be evaluated and meds to treat  ? H/O hiatal hernia   ? Hearing loss   ? some hearing loss in left ear but has not been dx, no hearing aids  ? Hepatitis   ? h/o of type A  ? History of measles as a child   ? History of mumps as a child   ? Hypertension   ? Hypothyroidism   ? Micturition syncope   ? Osteoarthritis   ? Pleural effusion, bilateral   ? 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 26 mm Edwards Sapien 3 transcatheter heart valve placed via percutaneous right transfemoral approach   ? Severe aortic stenosis   ? Sleep apnea   ? uses CPAP nightly  ? Vitamin D deficiency 11/24/2015  ? ? ?Past Surgical History:  ?Procedure Laterality Date  ? CARDIAC CATHETERIZATION  03/18/2019  ? CARDIAC ELECTROPHYSIOLOGY MAPPING AND ABLATION  06/2008  ? CATARACT EXTRACTION W/ INTRAOCULAR LENS  IMPLANT, BILATERAL  ~ 2007  ? COLONOSCOPY    ? FOOT TENDON SURGERY Left   ? INTRAOPERATIVE TRANSTHORACIC ECHOCARDIOGRAM N/A 04/13/2019  ? Procedure: TRANSTHORACIC ECHOCARDIOGRAM;  Surgeon: Sherren Mocha, MD;  Location: [place];  Service: Open Heart Surgery;  Laterality: N/A;  ? RIGHT/LEFT HEART CATH AND CORONARY ANGIOGRAPHY N/A 03/18/2019  ? Procedure: RIGHT/LEFT HEART CATH AND CORONARY ANGIOGRAPHY;  Surgeon: Jolaine Artist, MD;  Location: [place] CV LAB;  Service: Cardiovascular;  Laterality: N/A;  ? TONSILLECTOMY  1940's  ? TOTAL KNEE ARTHROPLASTY  1990's  ? right  ? TOTAL KNEE ARTHROPLASTY Left 08/02/2013  ? Procedure: LEFT TOTAL KNEE ARTHROPLASTY;  Surgeon: Gearlean Alf, MD;  [facility];  Service: Orthopedics;  Laterality: Left;  ? TRANSCATHETER AORTIC VALVE  REPLACEMENT, TRANSFEMORAL N/A 04/13/2019  ? Procedure: TRANSCATHETER AORTIC VALVE REPLACEMENT, TRANSFEMORAL;  Surgeon: Sherren Mocha, MD;  Location: [place];  Service: Open Heart Surgery;  Laterality: N/A;  ? UPPER GI ENDOSCOPY    ? WISDOM TOOTH EXTRACTION    ? ? ?MEDICATIONS: ? amLODipine (NORVASC) 10 MG tablet  ? amoxicillin (AMOXIL) 500 MG tablet  ? apixaban (ELIQUIS) 2.5 MG TABS tablet  ? Brinzolamide-Brimonidine (SIMBRINZA) 1-0.2 % SUSP  ? carboxymethylcellulose (REFRESH PLUS) 0.5 % SOLN  ? cholecalciferol (VITAMIN D3) 25 MCG (1000 UNIT) tablet  ? CINNAMON PO  ? cloNIDine (CATAPRES) 0.1 MG tablet  ? Coenzyme Q10 ([employer]S COQ-10) 200 MG capsule  ? empagliflozin (JARDIANCE) 25 MG TABS tablet  ? famotidine (PEPCID) 20 MG tablet  ? ferrous sulfate 325 (65 FE) MG tablet  ?  finasteride (PROSCAR) 5 MG tablet  ? ketotifen (ZADITOR) 0.025 % ophthalmic solution  ? levothyroxine (SYNTHROID) 200 MCG tablet  ? loratadine (CLARITIN) 10 MG tablet  ? MELATONIN PO  ? metFORMIN (GLUCOPHAGE) 500 MG tablet  ? nilotinib (TASIGNA) 150 MG capsule  ? PARoxetine (PAXIL) 40 MG tablet  ? potassium chloride SA (KLOR-CON M20) 20 MEQ tablet  ? rosuvastatin (CRESTOR) 10 MG tablet  ? spironolactone (ALDACTONE) 25 MG tablet  ? torsemide (DEMADEX) 20 MG tablet  ? traZODone (DESYREL) 50 MG tablet  ? TURMERIC PO  ? White Petrolatum-Mineral Oil (LUBRICANT EYE NIGHTTIME) OINT  ? ?No current facility-administered medications for this encounter.  ? ? ? ? ?Konrad Felix Ward, PA-C ?[facility] ?[phone] ? ? ? ? ?

## 2021-08-09 NOTE — Anesthesia Preprocedure Evaluation (Addendum)
Anesthesia Evaluation  Patient identified by MRN, date of birth, ID band Patient awake    Reviewed: Allergy & Precautions, NPO status , Patient's Chart, lab work & pertinent test results, reviewed documented beta blocker date and time   Airway Mallampati: II  TM Distance: >3 FB Neck ROM: Full    Dental  (+) Teeth Intact, Dental Advisory Given, Caps   Pulmonary sleep apnea and Continuous Positive Airway Pressure Ventilation , COPD,  COPD inhaler, former smoker,    Pulmonary exam normal breath sounds clear to auscultation       Cardiovascular hypertension, Pt. on medications and Pt. on home beta blockers + CAD and +CHF  + dysrhythmias Atrial Fibrillation + Valvular Problems/Murmurs AS  Rhythm:Irregular Rate:Normal  S/P TAVR 1/21 for severe AS  EKG  Atrial fibrillation, LAFB  Hx/o Atrial fibrillation ablation in 2010, currently in a. Fib   Neuro/Psych PSYCHIATRIC DISORDERS Anxiety Depression Bilateral HOH Glaucoma  Neuromuscular disease    GI/Hepatic hiatal hernia, GERD  Medicated and Controlled,(+) Hepatitis -, A  Endo/Other  diabetes, Poorly Controlled, Type 2, Oral Hypoglycemic AgentsHypothyroidism Hyperlipidemia  Renal/GU negative Renal ROS  negative genitourinary   Musculoskeletal  (+) Arthritis , Osteoarthritis,  Bilateral inguinal hernias   Abdominal   Peds  Hematology  (+) Blood dyscrasia, anemia , Eliquis therapy- last dose 5/15   Anesthesia Other Findings   Reproductive/Obstetrics                                                             Anesthesia Evaluation  Patient identified by MRN, date of birth, ID band Patient awake    Reviewed: Allergy & Precautions, NPO status , Patient's Chart, lab work & pertinent test results  Airway Mallampati: II  TM Distance: >3 FB Neck ROM: Full    Dental  (+) Dental Advisory Given, Caps,    Pulmonary sleep apnea and Continuous  Positive Airway Pressure Ventilation , COPD, former smoker,    Pulmonary exam normal breath sounds clear to auscultation       Cardiovascular hypertension, Pt. on medications + CAD and +CHF  + dysrhythmias (Atrial flutter s/p ablation) + Valvular Problems/Murmurs AS  Rhythm:Regular Rate:Normal + Systolic murmurs    Neuro/Psych PSYCHIATRIC DISORDERS Anxiety Depression negative neurological ROS     GI/Hepatic Neg liver ROS, hiatal hernia, GERD  Medicated,  Endo/Other  diabetes, Well Controlled, Type 2Hypothyroidism   Renal/GU Renal InsufficiencyRenal disease     Musculoskeletal  (+) Arthritis ,   Abdominal   Peds  Hematology  (+) Blood dyscrasia (Eliquis), anemia ,   Anesthesia Other Findings Day of surgery medications reviewed with the patient.  Reproductive/Obstetrics                            Anesthesia Physical Anesthesia Plan  ASA: IV  Anesthesia Plan: MAC   Post-op Pain Management:    Induction: Intravenous  PONV Risk Score and Plan: 1 and Propofol infusion and Treatment may vary due to age or medical condition  Airway Management Planned: Natural Airway and Nasal Cannula  Additional Equipment: Arterial line  Intra-op Plan:   Post-operative Plan:   Informed Consent: I have reviewed the patients History and Physical, chart, labs and discussed the procedure including the risks, benefits and alternatives  for the proposed anesthesia with the patient or authorized representative who has indicated his/her understanding and acceptance.     Dental advisory given  Plan Discussed with: CRNA  Anesthesia Plan Comments:         Anesthesia Quick Evaluation  Anesthesia Physical Anesthesia Plan  ASA: 3  Anesthesia Plan: General   Post-op Pain Management:    Induction: Intravenous and Cricoid pressure planned  PONV Risk Score and Plan: 4 or greater and Treatment may vary due to age or medical condition and  Ondansetron  Airway Management Planned: Oral ETT  Additional Equipment: None  Intra-op Plan:   Post-operative Plan: Extubation in OR  Informed Consent: I have reviewed the patients History and Physical, chart, labs and discussed the procedure including the risks, benefits and alternatives for the proposed anesthesia with the patient or authorized representative who has indicated his/her understanding and acceptance.     Dental advisory given  Plan Discussed with: CRNA and Anesthesiologist  Anesthesia Plan Comments: (See PAT note 08/06/2021)       Anesthesia Quick Evaluation

## 2021-08-16 ENCOUNTER — Ambulatory Visit (HOSPITAL_COMMUNITY)
Admission: RE | Admit: 2021-08-16 | Discharge: 2021-08-16 | Disposition: A | Discharge status: Home / Self Care | Payer: Medicare Other | Source: Ambulatory Visit | Attending: General Surgery | Admitting: General Surgery

## 2021-08-16 ENCOUNTER — Encounter (HOSPITAL_COMMUNITY)
Admission: RE | Disposition: A | Discharge status: Home / Self Care | Payer: Self-pay | Source: Ambulatory Visit | Attending: General Surgery

## 2021-08-16 ENCOUNTER — Encounter (HOSPITAL_COMMUNITY): Payer: Self-pay | Admitting: General Surgery

## 2021-08-16 ENCOUNTER — Other Ambulatory Visit: Payer: Self-pay

## 2021-08-16 ENCOUNTER — Ambulatory Visit (HOSPITAL_COMMUNITY): Payer: Medicare Other | Admitting: Physician Assistant

## 2021-08-16 ENCOUNTER — Ambulatory Visit (HOSPITAL_BASED_OUTPATIENT_CLINIC_OR_DEPARTMENT_OTHER): Payer: Medicare Other | Admitting: Certified Registered"

## 2021-08-16 DIAGNOSIS — I5032 Chronic diastolic (congestive) heart failure: Secondary | ICD-10-CM | POA: Diagnosis not present

## 2021-08-16 DIAGNOSIS — I110 Hypertensive heart disease with heart failure: Secondary | ICD-10-CM | POA: Diagnosis not present

## 2021-08-16 DIAGNOSIS — I5022 Chronic systolic (congestive) heart failure: Secondary | ICD-10-CM

## 2021-08-16 DIAGNOSIS — G4733 Obstructive sleep apnea (adult) (pediatric): Secondary | ICD-10-CM | POA: Diagnosis not present

## 2021-08-16 DIAGNOSIS — E039 Hypothyroidism, unspecified: Secondary | ICD-10-CM | POA: Insufficient documentation

## 2021-08-16 DIAGNOSIS — G4730 Sleep apnea, unspecified: Secondary | ICD-10-CM | POA: Insufficient documentation

## 2021-08-16 DIAGNOSIS — J449 Chronic obstructive pulmonary disease, unspecified: Secondary | ICD-10-CM | POA: Diagnosis not present

## 2021-08-16 DIAGNOSIS — Z7901 Long term (current) use of anticoagulants: Secondary | ICD-10-CM | POA: Diagnosis not present

## 2021-08-16 DIAGNOSIS — Z9989 Dependence on other enabling machines and devices: Secondary | ICD-10-CM

## 2021-08-16 DIAGNOSIS — I2510 Atherosclerotic heart disease of native coronary artery without angina pectoris: Secondary | ICD-10-CM | POA: Insufficient documentation

## 2021-08-16 DIAGNOSIS — I509 Heart failure, unspecified: Secondary | ICD-10-CM | POA: Diagnosis not present

## 2021-08-16 DIAGNOSIS — E1165 Type 2 diabetes mellitus with hyperglycemia: Secondary | ICD-10-CM | POA: Diagnosis not present

## 2021-08-16 DIAGNOSIS — E669 Obesity, unspecified: Secondary | ICD-10-CM | POA: Insufficient documentation

## 2021-08-16 DIAGNOSIS — K4020 Bilateral inguinal hernia, without obstruction or gangrene, not specified as recurrent: Secondary | ICD-10-CM | POA: Insufficient documentation

## 2021-08-16 DIAGNOSIS — Z87891 Personal history of nicotine dependence: Secondary | ICD-10-CM | POA: Insufficient documentation

## 2021-08-16 DIAGNOSIS — Z952 Presence of prosthetic heart valve: Secondary | ICD-10-CM | POA: Insufficient documentation

## 2021-08-16 DIAGNOSIS — Z7984 Long term (current) use of oral hypoglycemic drugs: Secondary | ICD-10-CM | POA: Diagnosis not present

## 2021-08-16 DIAGNOSIS — C9210 Chronic myeloid leukemia, BCR/ABL-positive, not having achieved remission: Secondary | ICD-10-CM | POA: Diagnosis not present

## 2021-08-16 DIAGNOSIS — E785 Hyperlipidemia, unspecified: Secondary | ICD-10-CM | POA: Insufficient documentation

## 2021-08-16 DIAGNOSIS — K219 Gastro-esophageal reflux disease without esophagitis: Secondary | ICD-10-CM | POA: Insufficient documentation

## 2021-08-16 DIAGNOSIS — I482 Chronic atrial fibrillation, unspecified: Secondary | ICD-10-CM | POA: Diagnosis not present

## 2021-08-16 LAB — GLUCOSE, CAPILLARY
Glucose-Capillary: 141 mg/dL — ABNORMAL HIGH (ref 70–99)
Glucose-Capillary: 184 mg/dL — ABNORMAL HIGH (ref 70–99)

## 2021-08-16 LAB — TYPE AND SCREEN
ABO/RH(D): A POS
Antibody Screen: NEGATIVE

## 2021-08-16 SURGERY — REPAIR, HERNIA, INGUINAL, BILATERAL, ROBOT-ASSISTED
Anesthesia: General | Laterality: Bilateral

## 2021-08-16 MED ORDER — APIXABAN 2.5 MG PO TABS: 2.5000 mg | ORAL_TABLET | Freq: Two times a day (BID) | ORAL | 3 refills | Status: DC

## 2021-08-16 MED ORDER — ONDANSETRON HCL 4 MG/2ML IJ SOLN
INTRAMUSCULAR | Status: DC | PRN
  Administered 2021-08-16: 4 mg via INTRAVENOUS

## 2021-08-16 MED ORDER — OXYCODONE HCL 5 MG PO TABS: 5.0000 mg | ORAL_TABLET | Freq: Four times a day (QID) | ORAL | 0 refills | Status: DC | PRN

## 2021-08-16 MED ORDER — FENTANYL CITRATE (PF) 250 MCG/5ML IJ SOLN
INTRAMUSCULAR | Status: DC | PRN
  Administered 2021-08-16 (×4): 25 ug via INTRAVENOUS

## 2021-08-16 MED ORDER — DEXAMETHASONE SODIUM PHOSPHATE 10 MG/ML IJ SOLN
INTRAMUSCULAR | Status: DC | PRN
  Administered 2021-08-16: 4 mg via INTRAVENOUS

## 2021-08-16 MED ORDER — CHLORHEXIDINE GLUCONATE CLOTH 2 % EX PADS: 6.0000 | MEDICATED_PAD | Freq: Once | CUTANEOUS | Status: DC

## 2021-08-16 MED ORDER — PROPOFOL 10 MG/ML IV BOLUS
INTRAVENOUS | Status: AC
  Filled 2021-08-16: qty 20

## 2021-08-16 MED ORDER — LIDOCAINE 2% (20 MG/ML) 5 ML SYRINGE
INTRAMUSCULAR | Status: DC | PRN
  Administered 2021-08-16: 40 mg via INTRAVENOUS

## 2021-08-16 MED ORDER — BUPIVACAINE-EPINEPHRINE (PF) 0.25% -1:200000 IJ SOLN
INTRAMUSCULAR | Status: DC | PRN
  Administered 2021-08-16: 30 mL via PERINEURAL

## 2021-08-16 MED ORDER — GLYCOPYRROLATE 0.2 MG/ML IJ SOLN
INTRAMUSCULAR | Status: AC
  Filled 2021-08-16: qty 1

## 2021-08-16 MED ORDER — LACTATED RINGERS IR SOLN
Status: DC | PRN
  Administered 2021-08-16: 1

## 2021-08-16 MED ORDER — CHLORHEXIDINE GLUCONATE 0.12 % MT SOLN
15.0000 mL | Freq: Once | OROMUCOSAL | Status: AC
  Administered 2021-08-16: 15 mL via OROMUCOSAL

## 2021-08-16 MED ORDER — ENSURE PRE-SURGERY PO LIQD
296.0000 mL | Freq: Once | ORAL | Status: DC
  Filled 2021-08-16: qty 296

## 2021-08-16 MED ORDER — PROPOFOL 10 MG/ML IV BOLUS
INTRAVENOUS | Status: DC | PRN
  Administered 2021-08-16: 120 mg via INTRAVENOUS

## 2021-08-16 MED ORDER — PHENYLEPHRINE 80 MCG/ML (10ML) SYRINGE FOR IV PUSH (FOR BLOOD PRESSURE SUPPORT)
PREFILLED_SYRINGE | INTRAVENOUS | Status: AC
  Filled 2021-08-16: qty 10

## 2021-08-16 MED ORDER — ALBUTEROL SULFATE (2.5 MG/3ML) 0.083% IN NEBU
2.5000 mg | INHALATION_SOLUTION | Freq: Four times a day (QID) | RESPIRATORY_TRACT | Status: DC | PRN
  Administered 2021-08-16: 2.5 mg via RESPIRATORY_TRACT

## 2021-08-16 MED ORDER — ROCURONIUM BROMIDE 10 MG/ML (PF) SYRINGE
PREFILLED_SYRINGE | INTRAVENOUS | Status: DC | PRN
  Administered 2021-08-16: 70 mg via INTRAVENOUS

## 2021-08-16 MED ORDER — BUPIVACAINE-EPINEPHRINE (PF) 0.25% -1:200000 IJ SOLN
INTRAMUSCULAR | Status: AC
  Filled 2021-08-16: qty 30

## 2021-08-16 MED ORDER — ACETAMINOPHEN 500 MG PO TABS: 1000.0000 mg | ORAL_TABLET | Freq: Three times a day (TID) | ORAL | 0 refills | Status: AC

## 2021-08-16 MED ORDER — FENTANYL CITRATE (PF) 100 MCG/2ML IJ SOLN
INTRAMUSCULAR | Status: AC
  Filled 2021-08-16: qty 2

## 2021-08-16 MED ORDER — BUPIVACAINE LIPOSOME 1.3 % IJ SUSP
INTRAMUSCULAR | Status: DC | PRN
  Administered 2021-08-16: 20 mL

## 2021-08-16 MED ORDER — ONDANSETRON HCL 4 MG/2ML IJ SOLN: 4.0000 mg | Freq: Once | INTRAMUSCULAR | Status: DC | PRN

## 2021-08-16 MED ORDER — OXYCODONE HCL 5 MG PO TABS: 5.0000 mg | ORAL_TABLET | Freq: Once | ORAL | Status: DC | PRN

## 2021-08-16 MED ORDER — GLYCOPYRROLATE 0.2 MG/ML IJ SOLN
INTRAMUSCULAR | Status: DC | PRN
  Administered 2021-08-16: .2 mg via INTRAVENOUS

## 2021-08-16 MED ORDER — 0.9 % SODIUM CHLORIDE (POUR BTL) OPTIME
TOPICAL | Status: DC | PRN
  Administered 2021-08-16: 1000 mL

## 2021-08-16 MED ORDER — ORAL CARE MOUTH RINSE: 15.0000 mL | Freq: Once | OROMUCOSAL | Status: AC

## 2021-08-16 MED ORDER — ALBUTEROL SULFATE (2.5 MG/3ML) 0.083% IN NEBU
INHALATION_SOLUTION | RESPIRATORY_TRACT | Status: AC
  Filled 2021-08-16: qty 3

## 2021-08-16 MED ORDER — FENTANYL CITRATE PF 50 MCG/ML IJ SOSY: 25.0000 ug | PREFILLED_SYRINGE | INTRAMUSCULAR | Status: DC | PRN

## 2021-08-16 MED ORDER — BUPIVACAINE LIPOSOME 1.3 % IJ SUSP
INTRAMUSCULAR | Status: AC
  Filled 2021-08-16: qty 20

## 2021-08-16 MED ORDER — CEFAZOLIN SODIUM-DEXTROSE 2-4 GM/100ML-% IV SOLN
2.0000 g | INTRAVENOUS | Status: AC
  Administered 2021-08-16: 2 g via INTRAVENOUS
  Filled 2021-08-16: qty 100

## 2021-08-16 MED ORDER — PHENYLEPHRINE 80 MCG/ML (10ML) SYRINGE FOR IV PUSH (FOR BLOOD PRESSURE SUPPORT)
PREFILLED_SYRINGE | INTRAVENOUS | Status: DC | PRN
  Administered 2021-08-16: 40 ug via INTRAVENOUS

## 2021-08-16 MED ORDER — ATROPINE SULFATE 0.4 MG/ML IV SOLN
INTRAVENOUS | Status: AC
  Filled 2021-08-16: qty 1

## 2021-08-16 MED ORDER — OXYCODONE HCL 5 MG/5ML PO SOLN: 5.0000 mg | Freq: Once | ORAL | Status: DC | PRN

## 2021-08-16 MED ORDER — ACETAMINOPHEN 500 MG PO TABS
1000.0000 mg | ORAL_TABLET | ORAL | Status: AC
Start: 2021-08-16 — End: 2021-08-16
  Administered 2021-08-16: 1000 mg via ORAL
  Filled 2021-08-16: qty 2

## 2021-08-16 MED ORDER — LACTATED RINGERS IV SOLN: INTRAVENOUS | Status: DC

## 2021-08-16 SURGICAL SUPPLY — 71 items
ADH SKN CLS APL DERMABOND .7 (GAUZE/BANDAGES/DRESSINGS) ×1
APL PRP STRL LF DISP 70% ISPRP (MISCELLANEOUS) ×1
BAG COUNTER SPONGE SURGICOUNT (BAG) IMPLANT
BAG SPNG CNTER NS LX DISP (BAG)
BLADE SURG SZ11 CARB STEEL (BLADE) ×2 IMPLANT
CANNULA REDUC XI 12-8 STAPL (CANNULA)
CANNULA REDUCER 12-8 DVNC XI (CANNULA) IMPLANT
CHLORAPREP W/TINT 26 (MISCELLANEOUS) ×2 IMPLANT
COVER MAYO STAND STRL (DRAPES) ×2 IMPLANT
COVER SURGICAL LIGHT HANDLE (MISCELLANEOUS) ×2 IMPLANT
COVER TIP SHEARS 8 DVNC (MISCELLANEOUS) ×1 IMPLANT
COVER TIP SHEARS 8MM DA VINCI (MISCELLANEOUS) ×2
DERMABOND ADVANCED (GAUZE/BANDAGES/DRESSINGS) ×1
DERMABOND ADVANCED .7 DNX12 (GAUZE/BANDAGES/DRESSINGS) IMPLANT
DEVICE SECURE STRAP 25 ABSORB (INSTRUMENTS) ×1 IMPLANT
DRAPE ARM DVNC X/XI (DISPOSABLE) ×3 IMPLANT
DRAPE COLUMN DVNC XI (DISPOSABLE) ×1 IMPLANT
DRAPE DA VINCI XI ARM (DISPOSABLE) ×6
DRAPE DA VINCI XI COLUMN (DISPOSABLE) ×2
DRSG TEGADERM 2-3/8X2-3/4 SM (GAUZE/BANDAGES/DRESSINGS) ×6 IMPLANT
ELECT REM PT RETURN 15FT ADLT (MISCELLANEOUS) ×2 IMPLANT
GAUZE SPONGE 2X2 8PLY STRL LF (GAUZE/BANDAGES/DRESSINGS) IMPLANT
GLOVE BIO SURGEON STRL SZ7.5 (GLOVE) ×4 IMPLANT
GLOVE BIOGEL PI IND STRL 8 (GLOVE) ×1 IMPLANT
GLOVE BIOGEL PI INDICATOR 8 (GLOVE) ×1
GLOVE BIOGEL PI ORTHO PRO 7.5 (GLOVE) ×2
GLOVE PI ORTHO PRO STRL 7.5 (GLOVE) ×2 IMPLANT
GOWN STRL REUS W/ TWL XL LVL3 (GOWN DISPOSABLE) ×2 IMPLANT
GOWN STRL REUS W/TWL XL LVL3 (GOWN DISPOSABLE) ×4
GRASPER SUT TROCAR 14GX15 (MISCELLANEOUS) ×2 IMPLANT
IRRIG SUCT STRYKERFLOW 2 WTIP (MISCELLANEOUS)
IRRIGATION SUCT STRKRFLW 2 WTP (MISCELLANEOUS) IMPLANT
KIT BASIN OR (CUSTOM PROCEDURE TRAY) ×2 IMPLANT
KIT TURNOVER KIT A (KITS) IMPLANT
MARKER SKIN DUAL TIP RULER LAB (MISCELLANEOUS) ×2 IMPLANT
MESH 3DMAX 4X6 LT LRG (Mesh General) ×1 IMPLANT
MESH 3DMAX 4X6 RT LRG (Mesh General) ×1 IMPLANT
MESH 3DMAX MID 4X6 LT LRG (Mesh General) IMPLANT
MESH 3DMAX MID 4X6 RT LRG (Mesh General) IMPLANT
NEEDLE HYPO 22GX1.5 SAFETY (NEEDLE) ×2 IMPLANT
OBTURATOR OPTICAL STANDARD 8MM (TROCAR) ×2
OBTURATOR OPTICAL STND 8 DVNC (TROCAR) ×1
OBTURATOR OPTICALSTD 8 DVNC (TROCAR) ×1 IMPLANT
PACK CARDIOVASCULAR III (CUSTOM PROCEDURE TRAY) ×2 IMPLANT
PAD POSITIONING PINK XL (MISCELLANEOUS) ×2 IMPLANT
SCISSORS LAP 5X35 DISP (ENDOMECHANICALS) IMPLANT
SEAL CANN UNIV 5-8 DVNC XI (MISCELLANEOUS) ×2 IMPLANT
SEAL XI 5MM-8MM UNIVERSAL (MISCELLANEOUS) ×4
SOL ANTI FOG 6CC (MISCELLANEOUS) ×1 IMPLANT
SOLUTION ANTI FOG 6CC (MISCELLANEOUS) ×1
SOLUTION ELECTROLUBE (MISCELLANEOUS) ×2 IMPLANT
SPIKE FLUID TRANSFER (MISCELLANEOUS) ×2 IMPLANT
SPONGE GAUZE 2X2 STER 10/PKG (GAUZE/BANDAGES/DRESSINGS)
STAPLER CANNULA SEAL DVNC XI (STAPLE) ×1 IMPLANT
STAPLER CANNULA SEAL XI (STAPLE) ×2
STRIP CLOSURE SKIN 1/2X4 (GAUZE/BANDAGES/DRESSINGS) IMPLANT
SUT MNCRL AB 4-0 PS2 18 (SUTURE) ×2 IMPLANT
SUT VIC AB 2-0 SH 27 (SUTURE)
SUT VIC AB 2-0 SH 27X BRD (SUTURE) IMPLANT
SUT VIC AB 3-0 SH 27 (SUTURE) ×4
SUT VIC AB 3-0 SH 27XBRD (SUTURE) ×2 IMPLANT
SUT VICRYL 0 UR6 27IN ABS (SUTURE) ×3 IMPLANT
SUT VLOC 180 3-0 9IN GS21 (SUTURE) ×2 IMPLANT
SUT VLOC BARB 180 ABS3/0GR12 (SUTURE) ×4
SUTURE VLOC BRB 180 ABS3/0GR12 (SUTURE) IMPLANT
SYR 20ML LL LF (SYRINGE) ×2 IMPLANT
TAPE STRIPS DRAPE STRL (GAUZE/BANDAGES/DRESSINGS) ×2 IMPLANT
TOWEL OR 17X26 10 PK STRL BLUE (TOWEL DISPOSABLE) ×2 IMPLANT
TOWEL OR NON WOVEN STRL DISP B (DISPOSABLE) ×2 IMPLANT
TROCAR BLADELESS OPT 5 100 (ENDOMECHANICALS) ×2 IMPLANT
TUBING INSUFFLATION 10FT LAP (TUBING) ×2 IMPLANT

## 2021-08-16 NOTE — Transfer of Care (Signed)
Immediate Anesthesia Transfer of Care Note  Patient: [name] White  Procedure(s) Performed: LAPAROSCOPIC REPAIR BILATERAL INGUINAL HERNIAS WITH MESH (Bilateral)  Patient Location: PACU  Anesthesia Type:General  Level of Consciousness: awake and drowsy  Airway & Oxygen Therapy: Patient Spontanous Breathing and Patient connected to face mask oxygen  Post-op Assessment: Report given to RN and Post -op Vital signs reviewed and stable  Post vital signs: Reviewed and stable  Last Vitals:  Vitals Value Taken Time  BP    Temp    Pulse 98 08/16/21 1507  Resp 25 08/16/21 1507  SpO2 92 % 08/16/21 1507  Vitals shown include unvalidated device data.  Last Pain:  Vitals:   08/16/21 0958  TempSrc:   PainSc: 0-No pain         Complications: No notable events documented.

## 2021-08-16 NOTE — Op Note (Addendum)
08/16/2021  Dineen Kid Jr [birth year]   PREOPERATIVE DIAGNOSIS: bilateral inguinal hernia.   POSTOPERATIVE DIAGNOSIS: bilateral inguinal hernia.   PROCEDURE: Laparoscopic repair of bilateral indirect inguinal hernias with  mesh (TAPP).   SURGEON: Leighton Ruff. Redmond Pulling, MD   ASSISTANT SURGEON: Nicanor Alcon, MD PGY-4  ANESTHESIA: General plus local consisting of 1% Marcaine with Exparel.  ESTIMATED BLOOD LOSS: Minimal.   FINDINGS: The patient had a large RIGHT indirect inguinal hernia, LEFT indirect and direct inguinal hernia.  These were repaired with two pieces of Bard large 3DMax MID Anatomical mesh.  SPECIMEN: none  INDICATIONS FOR PROCEDURE: Ulus Hazen is a 86 y.o. man with bilateral symptomatic inguinal hernias, right greater than left. The risks and benefits including but not limited to bleeding, infection, chronic inguinal pain, nerve entrapment, hernia recurrence, mesh complications, hematoma formation, urinary retention, injury to the testicles or the ovaries, numbness in the groin, blood clots, injury to the surrounding structures, and anesthesia risk was discussed with the patient.  DESCRIPTION OF PROCEDURE: After obtaining verbal consent and marking  the bilateral groins in the holding area with the patient confirming the  operative site, the patient was then taken back to the operating room, placed  supine on the operating room table. General endotracheal anesthesia was  established. The patient had emptied their bladder prior to going back to  the operating room. Sequential compression devices were placed. The  abdomen and groin were prepped and draped in the usual standard surgical  fashion with ChloraPrep. The patient received oral Tylenol as well as IV  antibiotics prior to the incision. A surgical time-out was performed.  Local was infiltrated at the base of the umbilicus.   Next, a 1-cm vertical supraumbilical incision was made with a #11 blade. The  fascia  was grasped and lifted anteriorly. Next, the fascia was incised, and  the abdominal cavity was entered. Pursestring suture was placed around  the fascial edges using a 0 Vicryl. A 12-mm Hasson trocar was placed.  Pneumoperitoneum was smoothly established up to a patient pressure of 15  mmHg. The patient briefly became bradycardic requiring desufflation, but recovered quickly. We then re-insufflated uneventfully. Laparoscope was advanced. Two 5-mm trocars were placed, one on the right, one on the left  in the midclavicular line slightly above the level of the umbilicus all  under direct visualization. There were some attachments from the colonic epiploica to the anterior abdominal wall in the area of the intended peritoneal flaps; these were taken down with shears and cautery taking care not to injury the bowel.  I then  made incision along the peritoneum on the right, starting 2 inches above  the anterior superior iliac spine and caring it medial  toward the median umbilical ligament using  Endo Shears with electrocautery. The peritoneal flap was then gently  dissected downward from the anterior abdominal wall taking care not to  injure the inferior epigastric vessels. The pubic bone was identified.  The testicular vessels were identified.  Using  traction and counter traction with short graspers, I reduced the large indirect sac in  its entirety. The testicular vessels had been identified and preserved. The vas deferens was identified and preserved, and the hernia sac was stripped from those to  surrounding structures. I then went about creating a large pocket by  lifting the peritoneum of the pelvic floor. I took great care not to  injure the iliac vessels. I then obtained a piece of Bard large 3DMax MID  anatomic right-sided mesh, placed it through the Hasson trocar, half of it covered medial  to the inferior epigastric vessels and half of it lateral to the  inferior epigastric  vessels. The defect was well  covered with the mesh. I then secured the mesh to the abdominal wall  using an Ethicon secure strap tack. Tacks were placed through  the Cooper's ligament, one tack on each side of the inferior epigastric  vessel and one tacks out laterally. No tacks were placed below the  shelving edge of the inguinal ligament.   The left side was then dissected out in similar fashion and repaired with a piece of left-sided anatomic mesh. There was a smaller indirect hernia as well as a small direct hernia.   The left sided peritoneal flap was then closed with running 3-0 V-lock 180 suture. There was a peritoneal defect that had been created during dissection of the colon attachments; this was repaired with a single figure-of-8 Vicryl suture. The right sided peritoneal flap was tacked back up to the abdominal wall using 3 tacks. There was no  defect in the peritoneum, and the mesh was well covered. I removed the  Hasson trocar and tied down the previously placed pursestring suture.  The closure was viewed laparoscopically. There was a tiny palpable fascial defect remaining, this was closed with an interrupted 0-Vicryl suture on a suture passer. There was no  evidence of injury to surrounding structures. Pneumoperitoneum was  released, and the remaining trocars were removed. All skin incisions  were closed with a 4-0 Monocryl in a subcuticular fashion followed by  application of Dermabond. All needle, instrument, and sponge counts  were correct x2. There are no immediate complications. The patient  tolerated the procedure well. The patient was extubated and taken to the  recovery room in stable condition.  I was present for the entire surgery except for skin closure and I was immediately available afterwards.  I have reviewed, edited where needed and agree with the operative note as documented by the resident.  Leighton Ruff. Redmond Pulling, MD, FACS General, Bariatric, & Minimally Invasive  Surgery [facility], [place]

## 2021-08-16 NOTE — H&P (Signed)
CC: here for surgery  Requesting provider: dr Alain Marion  HPI: [name] White is an 86 y.o. male who is here for laparoscopic repair of bilateral inguinal hernias with mesh.  He denies any medical changes since I saw him in the clinic in March  Old HPI:  He states that he has bilateral hernias. The right is larger and more symptomatic than the left. He was doing some yard work a few weeks ago and felt a pulling sensation. He also injured his right rotator cuff. He states that he has discomfort in the right groin. The left side really does not bother him. He will have a bulge throughout the day but at night and in the morning it rarely will be that large. He uses a cane. He lives with his 8 wife. He denies any nausea, vomiting, diarrhea or constipation.  His comorbidities includes hypertension, chronic diastolic heart failure, chronic A-fib on Eliquis, CML on chronic medication for that, diabetes mellitus type 2.  His oncologist at the [facility] in [place] is Dr. Denton Ar  He denies any chest pain, chest pressure, shortness of breath, dyspnea on exertion, peripheral edema. Denies any TIAs or amaurosis fugax.   Past Medical History:  Diagnosis Date   Anemia 06/04/2015   Anxiety    Atrial flutter ([place])    s/p ablation 07/17/08   Cancer ([place])    leukemia - dx 05/2018   Chronic diastolic CHF (congestive heart failure) ([place])    Coronary artery disease    Depression    Diabetes mellitus type 2 in obese ([place]) 04/10/2009   Qualifier: Diagnosis of  By: Arnoldo Morale MD, Balinda Quails, diet controlled, lost 60lbs   Emphysema of lung ([place])    GERD (gastroesophageal reflux disease)    Glaucoma    recent dx - has an appt to be evaluated and meds to treat   H/O hiatal hernia    Hearing loss    some hearing loss in left ear but has not been dx, no hearing aids   Hepatitis    h/o of type A   History of measles as a child    History of mumps as a child    Hypertension    Hypothyroidism    Micturition  syncope    Osteoarthritis    Pleural effusion, bilateral    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   26 mm Edwards Sapien 3 transcatheter heart valve placed via percutaneous right transfemoral approach    Severe aortic stenosis    Sleep apnea    uses CPAP nightly   Vitamin D deficiency 11/24/2015    Past Surgical History:  Procedure Laterality Date   CARDIAC CATHETERIZATION  03/18/2019   CARDIAC ELECTROPHYSIOLOGY MAPPING AND ABLATION  06/2008   CATARACT EXTRACTION W/ INTRAOCULAR LENS  IMPLANT, BILATERAL  ~ 2007   COLONOSCOPY     FOOT TENDON SURGERY Left    INTRAOPERATIVE TRANSTHORACIC ECHOCARDIOGRAM N/A 04/13/2019   Procedure: TRANSTHORACIC ECHOCARDIOGRAM;  Surgeon: Sherren Mocha, MD;  Location: [facility];  Service: Open Heart Surgery;  Laterality: N/A;   RIGHT/LEFT HEART CATH AND CORONARY ANGIOGRAPHY N/A 03/18/2019   Procedure: RIGHT/LEFT HEART CATH AND CORONARY ANGIOGRAPHY;  Surgeon: Jolaine Artist, MD;  Location: [facility];  Service: Cardiovascular;  Laterality: N/A;   TONSILLECTOMY  1940's   TOTAL KNEE ARTHROPLASTY  1990's   right   TOTAL KNEE ARTHROPLASTY Left 08/02/2013   Procedure: LEFT TOTAL KNEE ARTHROPLASTY;  Surgeon: Gearlean Alf, MD;  Location:  [facility];  Service: Orthopedics;  Laterality: Left;   TRANSCATHETER AORTIC VALVE REPLACEMENT, TRANSFEMORAL N/A 04/13/2019   Procedure: TRANSCATHETER AORTIC VALVE REPLACEMENT, TRANSFEMORAL;  Surgeon: Sherren Mocha, MD;  Location: [facility];  Service: Open Heart Surgery;  Laterality: N/A;   UPPER GI ENDOSCOPY     WISDOM TOOTH EXTRACTION      Family History  Problem Relation Age of Onset   Multiple sclerosis Father    Heart disease Mother        atrial fibrillation   Hypertension Son    Heart disease Maternal Grandfather     Social:  reports that he quit smoking about 25 years ago. His smoking use included pipe and cigars. He quit smokeless tobacco use about 12 years ago.  His smokeless tobacco use  included chew. He reports that he does not currently use alcohol. He reports that he does not use drugs.  Allergies:  Allergies  Allergen Reactions   Ace Inhibitors Anaphylaxis and Swelling    Face and lip swelling   Benazepril Anaphylaxis and Swelling    Face and lip swelling   Codeine Itching and Rash   Statins Other (See Comments)    Made very sick, doctor thought had heart attack; "my appearance, color, etc". Tolerating a low dose currently   Piroxicam Hives   Pseudoephedrine Other (See Comments)    Can't take because it interacts with dofetilide    Dasatinib     Hoarsness, weakness, scrotal swelling   Betadine [Povidone Iodine] Hives and Rash    Medications: I have reviewed the patient's current medications.   ROS - all of the below systems have been reviewed with the patient and positives are indicated with bold text General: chills, fever or night sweats Eyes: blurry vision or double vision ENT: epistaxis or sore throat Allergy/Immunology: itchy/watery eyes or nasal congestion Hematologic/Lymphatic: bleeding problems, blood clots or swollen lymph nodes Endocrine: temperature intolerance or unexpected weight changes Breast: new or changing breast lumps or nipple discharge Resp: cough, shortness of breath, or wheezing CV: chest pain or dyspnea on exertion GI: as per HPI GU: dysuria, trouble voiding, or hematuria MSK: joint pain or joint stiffness Neuro: TIA or stroke symptoms Derm: pruritus and skin lesion changes Psych: anxiety and depression  PE There were no vitals taken for this visit. Constitutional: NAD; conversant; no deformities Eyes: Moist conjunctiva; no lid lag; anicteric; PERRL Neck: Trachea midline; no thyromegaly Lungs: Normal respiratory effort; no tactile fremitus CV: RRR; no palpable thrills; no pitting edema GI: Abd soft, nt, nd; b/l inguinal bulges; no palpable hepatosplenomegaly MSK: Normal gait; no clubbing/cyanosis Psychiatric: Appropriate  affect; alert and oriented x3 Lymphatic: No palpable cervical or axillary lymphadenopathy Skin:no rash, no lesion  No results found for this or any previous visit (from the past 48 hour(s)).  No results found.  Imaging: reviewed  A/P: [name] White is an 86 y.o. male with  Non-recurrent bilateral inguinal hernia without obstruction or gangrene  Chronic atrial fibrillation (CMS-HCC)  CML (chronic myelocytic leukemia) (CMS-HCC)  S/P TAVR (transcatheter aortic valve replacement)  Diabetes mellitus type 2 in obese (CMS-HCC)  Antiplatelet or antithrombotic long-term use  Chronic diastolic heart failure, NYHA class 2 (CMS-HCC)  Patient stopped taking his Eliquis on May 15  To operating room for laparoscopic repair of bilateral inguinal hernias with mesh IV antibiotic prior to surgery Empty bladder on-call to surgery Discussed [facility] surgical resident involvement with this case  Patient states that his son will be helping him and his  wife so he is comfortable with going home after surgery  Leighton Ruff. Redmond Pulling, MD, FACS General, Bariatric, & Minimally Invasive Surgery [facility], [place]

## 2021-08-16 NOTE — Anesthesia Procedure Notes (Signed)
Procedure Name: Intubation Date/Time: 08/16/2021 12:23 PM Performed by: Eben Burow, CRNA Pre-anesthesia Checklist: Patient identified, Emergency Drugs available, Suction available, Patient being monitored and Timeout performed Patient Re-evaluated:Patient Re-evaluated prior to induction Oxygen Delivery Method: Circle system utilized Preoxygenation: Pre-oxygenation with 100% oxygen Induction Type: IV induction Ventilation: Mask ventilation without difficulty Laryngoscope Size: Mac and 4 Grade View: Grade II Tube type: Oral Tube size: 7.5 mm Number of attempts: 1 Airway Equipment and Method: Stylet Placement Confirmation: ETT inserted through vocal cords under direct vision, positive ETCO2 and breath sounds checked- equal and bilateral Secured at: 23 cm Tube secured with: Tape Dental Injury: Teeth and Oropharynx as per pre-operative assessment

## 2021-08-16 NOTE — Discharge Instructions (Addendum)
RESUME ELIQUIS ON Sunday 08/19/21  [facility] LAPAROSCOPIC SURGERY: POST OP INSTRUCTIONS Always review your discharge instruction sheet given to you by the facility where your surgery was performed. IF YOU HAVE DISABILITY OR FAMILY LEAVE FORMS, YOU MUST BRING THEM TO THE OFFICE FOR PROCESSING.   DO NOT GIVE THEM TO YOUR DOCTOR.  PAIN CONTROL  First take acetaminophen (Tylenol) AND/or ibuprofen (Advil) to control your pain after surgery.  Follow directions on package.  Taking acetaminophen (Tylenol) and/or ibuprofen (Advil) regularly after surgery will help to control your pain and lower the amount of prescription pain medication you may need.  You should not take more than 3,000 mg (3 grams) of acetaminophen (Tylenol) in 24 hours.  You should not take ibuprofen (Advil), aleve, motrin, naprosyn or other NSAIDS if you have a history of stomach ulcers or chronic kidney disease.  A prescription for pain medication may be given to you upon discharge.  Take your pain medication as prescribed, if you still have uncontrolled pain after taking acetaminophen (Tylenol) or ibuprofen (Advil). Use ice packs to help control pain. If you need a refill on your pain medication, please contact your pharmacy.  They will contact our office to request authorization. Prescriptions will not be filled after 5pm or on week-ends.  HOME MEDICATIONS Take your usually prescribed medications unless otherwise directed.  DIET You should follow a light diet the first few days after arrival home.  Be sure to include lots of fluids daily. Avoid fatty, fried foods.   CONSTIPATION It is common to experience some constipation after surgery and if you are taking pain medication.  Increasing fluid intake and taking a stool softener (such as Colace) will usually help or prevent this problem from occurring.  A mild laxative (Milk of Magnesia or Miralax) should be taken according to package instructions if there  are no bowel movements after 48 hours.  WOUND/INCISION CARE Most patients will experience some swelling and bruising in the area of the incisions.  Ice packs will help.  Swelling and bruising can take several days to resolve.  Unless discharge instructions indicate otherwise, follow guidelines below  STERI-STRIPS - you may remove your outer bandages 48 hours after surgery, and you may shower at that time.  You have steri-strips (small skin tapes) in place directly over the incision.  These strips should be left on the skin for 7-10 days.   DERMABOND/SKIN GLUE - you may shower in 24 hours.  The glue will flake off over the next 2-3 weeks. Any sutures or staples will be removed at the office during your follow-up visit.  ACTIVITIES You may resume regular (light) daily activities beginning the next day--such as daily self-care, walking, climbing stairs--gradually increasing activities as tolerated.  You may have sexual intercourse when it is comfortable.  Refrain from any heavy lifting or straining until approved by your doctor. You may drive when you are no longer taking prescription pain medication, you can comfortably wear a seatbelt, and you can safely maneuver your car and apply brakes.  FOLLOW-UP You should see your doctor in the office for a follow-up appointment approximately 2-3 weeks after your surgery.  You should have been given your post-op/follow-up appointment when your surgery was scheduled.  If you did not receive a post-op/follow-up appointment, make sure that you call for this appointment within a day or two after you arrive home to insure a convenient appointment time.  OTHER INSTRUCTIONS EXPECT SOME SWELLING IN YOUR GROIN  WHEN  TO CALL YOUR DOCTOR: Fever over 101.0 Inability to urinate Continued bleeding from incision. Increased pain, redness, or drainage from the incision. Increasing abdominal pain  The clinic staff is available to answer your questions during regular  business hours.  Please don't hesitate to call and ask to speak to one of the nurses for clinical concerns.  If you have a medical emergency, go to the nearest emergency room or call 911.  A surgeon from [facility] is always on call at the hospital. [address] [place], [place], [place]  [address] ? [address], [place], [place]   [address] [phone] ? [phone] ? FAX [phone] Web site: [email or web address]

## 2021-08-16 NOTE — Anesthesia Postprocedure Evaluation (Signed)
Anesthesia Post Note  Patient: [name] White  Procedure(s) Performed: LAPAROSCOPIC REPAIR BILATERAL INGUINAL HERNIAS WITH MESH (Bilateral)     Patient location during evaluation: PACU Anesthesia Type: General Level of consciousness: awake and alert and oriented Pain management: pain level controlled Vital Signs Assessment: post-procedure vital signs reviewed and stable Respiratory status: spontaneous breathing, nonlabored ventilation and respiratory function stable Cardiovascular status: blood pressure returned to baseline and stable Postop Assessment: no apparent nausea or vomiting Anesthetic complications: no   No notable events documented.  Last Vitals:  Vitals:   08/16/21 1533 08/16/21 1545  BP:  113/65  Pulse: 91 100  Resp: 18 16  Temp:    SpO2: 93% 92%    Last Pain:  Vitals:   08/16/21 1545  TempSrc:   PainSc: 0-No pain                 Vy Badley A.

## 2021-11-04 ENCOUNTER — Other Ambulatory Visit: Payer: Self-pay | Admitting: Internal Medicine

## 2021-11-20 ENCOUNTER — Encounter: Payer: Self-pay | Admitting: Internal Medicine

## 2021-11-20 ENCOUNTER — Ambulatory Visit (INDEPENDENT_AMBULATORY_CARE_PROVIDER_SITE_OTHER): Payer: Medicare Other | Admitting: Internal Medicine

## 2021-11-20 VITALS — BP 120/72 | HR 94 | Temp 97.9°F | Ht 74.0 in | Wt 221.0 lb

## 2021-11-20 DIAGNOSIS — F418 Other specified anxiety disorders: Secondary | ICD-10-CM | POA: Diagnosis not present

## 2021-11-20 DIAGNOSIS — E785 Hyperlipidemia, unspecified: Secondary | ICD-10-CM | POA: Diagnosis not present

## 2021-11-20 DIAGNOSIS — L603 Nail dystrophy: Secondary | ICD-10-CM | POA: Insufficient documentation

## 2021-11-20 DIAGNOSIS — C9210 Chronic myeloid leukemia, BCR/ABL-positive, not having achieved remission: Secondary | ICD-10-CM

## 2021-11-20 DIAGNOSIS — E1169 Type 2 diabetes mellitus with other specified complication: Secondary | ICD-10-CM | POA: Diagnosis not present

## 2021-11-20 DIAGNOSIS — I350 Nonrheumatic aortic (valve) stenosis: Secondary | ICD-10-CM

## 2021-11-20 DIAGNOSIS — E669 Obesity, unspecified: Secondary | ICD-10-CM

## 2021-11-20 NOTE — Assessment & Plan Note (Signed)
Trazodone  °

## 2021-11-20 NOTE — Assessment & Plan Note (Signed)
S/p TAVR - doing well

## 2021-11-20 NOTE — Assessment & Plan Note (Signed)
On Dasatinib po - swollen scrotum - better on intermittent Rx

## 2021-11-20 NOTE — Assessment & Plan Note (Signed)
Due to meds most likely Options discussed- nail conditioner

## 2021-11-20 NOTE — Progress Notes (Signed)
Subjective:  Patient ID: [name] White, male    DOB: 1935/04/07  Age: 86 y.o. MRN: [id number]  CC: Follow-up (6 month f/u)   HPI [name] White presents for hernias, DM, A fib, CML  Outpatient Medications Prior to Visit  Medication Sig Dispense Refill   amLODipine (NORVASC) 10 MG tablet Take 10 mg by mouth daily.     amoxicillin (AMOXIL) 500 MG tablet Take 4 capsules (2,000 mg) one hour prior to all dental visits. 8 tablet 11   apixaban (ELIQUIS) 2.5 MG TABS tablet Take 1 tablet (2.5 mg total) by mouth 2 (two) times daily. 180 tablet 3   Brinzolamide-Brimonidine (SIMBRINZA) 1-0.2 % SUSP Place 1 drop into both eyes in the morning, at noon, and at bedtime.     carboxymethylcellulose (REFRESH PLUS) 0.5 % SOLN Place 1 drop into both eyes in the morning, at noon, in the evening, and at bedtime.     cholecalciferol (VITAMIN D3) 25 MCG (1000 UNIT) tablet Take 1,000 Units by mouth at bedtime.     CINNAMON PO Take 2,000 mg by mouth 2 (two) times daily.      Coenzyme Q10 (CVS COQ-10) 200 MG capsule Take 200 mg by mouth every evening.      empagliflozin (JARDIANCE) 25 MG TABS tablet Take 25 mg by mouth daily.     famotidine (PEPCID) 20 MG tablet Take 20 mg by mouth daily as needed for indigestion or heartburn.     ferrous sulfate 325 (65 FE) MG tablet Take 325 mg by mouth every evening.      finasteride (PROSCAR) 5 MG tablet Take 5 mg by mouth every evening.     ketotifen (ZADITOR) 0.025 % ophthalmic solution Place 1 drop into both eyes in the morning and at bedtime.     levothyroxine (SYNTHROID) 200 MCG tablet Take 200 mcg by mouth daily before breakfast.     loratadine (CLARITIN) 10 MG tablet TAKE 1 TABLET BY MOUTH EVERY DAY *NOT COVERED 90 tablet 3   MELATONIN PO Take 1 tablet by mouth at bedtime.     metFORMIN (GLUCOPHAGE) 500 MG tablet Take 500 mg by mouth daily with breakfast.     nilotinib (TASIGNA) 150 MG capsule Take 300 mg by mouth 2 (two) times daily. Take on an empty stomach, 1 hr  before or 2 hrs after food.     oxyCODONE (OXY IR/ROXICODONE) 5 MG immediate release tablet Take 1 tablet (5 mg total) by mouth every 6 (six) hours as needed for severe pain. 15 tablet 0   PARoxetine (PAXIL) 40 MG tablet Take 1 tablet (40 mg total) by mouth every morning. 90 tablet 1   rosuvastatin (CRESTOR) 10 MG tablet Take 1 tablet (10 mg total) by mouth daily. Please schedule an appointment for further refills 30 tablet 0   spironolactone (ALDACTONE) 25 MG tablet Take 25 mg by mouth daily.     traZODone (DESYREL) 50 MG tablet Take 50 mg by mouth at bedtime.     TURMERIC PO Take 1,000 mg by mouth every evening.      White Petrolatum-Mineral Oil (LUBRICANT EYE NIGHTTIME) OINT Place 1 application. into both eyes at bedtime.     potassium chloride SA (KLOR-CON M20) 20 MEQ tablet Take 3 tablets (60 mEq total) by mouth daily for 3 days, THEN 1 tablet (20 mEq total) daily. (Patient taking differently: Take 20 mEq by mouth daily with supper.) 34 tablet 3   No facility-administered medications prior to visit.  ROS: Review of Systems  Constitutional:  Negative for appetite change, fatigue and unexpected weight change.  HENT:  Negative for congestion, nosebleeds, sneezing, sore throat and trouble swallowing.   Eyes:  Negative for itching and visual disturbance.  Respiratory:  Negative for cough.   Cardiovascular:  Negative for chest pain, palpitations and leg swelling.  Gastrointestinal:  Negative for abdominal distention, blood in stool, diarrhea and nausea.  Genitourinary:  Negative for frequency and hematuria.  Musculoskeletal:  Positive for arthralgias and gait problem. Negative for back pain, joint swelling and neck pain.  Skin:  Negative for rash.  Neurological:  Negative for dizziness, tremors, speech difficulty and weakness.  Psychiatric/Behavioral:  Negative for agitation, dysphoric mood and sleep disturbance. The patient is not nervous/anxious.     Objective:  BP 120/72 (BP  Location: Left Arm)   Pulse 94   Temp 97.9 F (36.6 C) (Oral)   Ht '6\' 2"'$  (1.88 m)   Wt 221 lb (100.2 kg)   SpO2 96%   BMI 28.37 kg/m   BP Readings from Last 3 Encounters:  11/20/21 120/72  08/16/21 133/74  08/06/21 130/81    Wt Readings from Last 3 Encounters:  11/20/21 221 lb (100.2 kg)  08/16/21 220 lb (99.8 kg)  08/06/21 220 lb (99.8 kg)    Physical Exam Constitutional:      General: He is not in acute distress.    Appearance: Normal appearance. He is well-developed.     Comments: NAD  Eyes:     Conjunctiva/sclera: Conjunctivae normal.     Pupils: Pupils are equal, round, and reactive to light.  Neck:     Thyroid: No thyromegaly.     Vascular: No JVD.  Cardiovascular:     Rate and Rhythm: Normal rate and regular rhythm.     Heart sounds: Normal heart sounds. No murmur heard.    No friction rub. No gallop.  Pulmonary:     Effort: Pulmonary effort is normal. No respiratory distress.     Breath sounds: Normal breath sounds. No wheezing or rales.  Chest:     Chest wall: No tenderness.  Abdominal:     General: Bowel sounds are normal. There is no distension.     Palpations: Abdomen is soft. There is no mass.     Tenderness: There is no abdominal tenderness. There is no guarding or rebound.  Musculoskeletal:        General: Tenderness present. Normal range of motion.     Cervical back: Normal range of motion.  Lymphadenopathy:     Cervical: No cervical adenopathy.  Skin:    General: Skin is warm and dry.     Findings: No rash.  Neurological:     Mental Status: He is alert and oriented to person, place, and time.     Cranial Nerves: No cranial nerve deficit.     Motor: No abnormal muscle tone.     Coordination: Coordination normal.     Gait: Gait abnormal.     Deep Tendon Reflexes: Reflexes are normal and symmetric.  Psychiatric:        Behavior: Behavior normal.        Thought Content: Thought content normal.        Judgment: Judgment normal.   Thin  brittle nails Using a cane   Lab Results  Component Value Date   WBC 6.7 08/06/2021   HGB 10.8 (L) 08/06/2021   HCT 35.2 (L) 08/06/2021   PLT 199 08/06/2021   GLUCOSE 165 (  H) 08/06/2021   CHOL 171 01/22/2018   TRIG 98 01/22/2018   HDL 57 01/22/2018   LDLDIRECT 141.6 11/12/2010   LDLCALC 94 01/22/2018   ALT 22 07/14/2019   AST 40 (H) 07/14/2019   NA 138 08/06/2021   K 4.8 08/06/2021   CL 108 08/06/2021   CREATININE 1.05 08/06/2021   BUN 27 (H) 08/06/2021   CO2 22 08/06/2021   TSH 1.25 07/14/2019   PSA 1.42 09/13/2013   INR 1.1 04/09/2019   HGBA1C 7.7 (H) 08/06/2021   MICROALBUR 6.0 (H) 01/10/2017    No results found.  Assessment & Plan:   Problem List Items Addressed This Visit     CML (chronic myelocytic leukemia) ([place])    On Dasatinib po - swollen scrotum - better on intermittent Rx      Relevant Orders   TSH   Urinalysis   CBC with Differential/Platelet   Lipid panel   PSA   Comprehensive metabolic panel   Hemoglobin A1c   Depression with anxiety (Chronic)    Trazodone      Diabetes mellitus type 2 in obese (H[facility]) (Chronic)    On Metformin and Jardiance per [facility]      Relevant Orders   TSH   Urinalysis   CBC with Differential/Platelet   Lipid panel   PSA   Comprehensive metabolic panel   Hemoglobin A1c   Hyperlipidemia - Primary (Chronic)   Relevant Orders   Lipid panel   Nail dystrophy    Due to meds most likely Options discussed- nail conditioner      Severe aortic stenosis    S/p TAVR - doing well      Relevant Orders   TSH   Urinalysis   CBC with Differential/Platelet   Lipid panel   PSA   Comprehensive metabolic panel   Hemoglobin A1c      No orders of the defined types were placed in this encounter.     Follow-up: Return in about 6 months (around 05/23/2022) for Wellness Exam.  Walker Kehr, MD

## 2021-11-20 NOTE — Assessment & Plan Note (Signed)
On Metformin and Jardiance per [facility].  

## 2021-12-20 DIAGNOSIS — L821 Other seborrheic keratosis: Secondary | ICD-10-CM | POA: Diagnosis not present

## 2021-12-20 DIAGNOSIS — D225 Melanocytic nevi of trunk: Secondary | ICD-10-CM | POA: Diagnosis not present

## 2021-12-20 DIAGNOSIS — D485 Neoplasm of uncertain behavior of skin: Secondary | ICD-10-CM | POA: Diagnosis not present

## 2021-12-20 DIAGNOSIS — L814 Other melanin hyperpigmentation: Secondary | ICD-10-CM | POA: Diagnosis not present

## 2021-12-21 ENCOUNTER — Other Ambulatory Visit (HOSPITAL_COMMUNITY): Payer: Self-pay

## 2021-12-26 ENCOUNTER — Other Ambulatory Visit (HOSPITAL_COMMUNITY): Payer: Self-pay

## 2021-12-26 MED ORDER — POTASSIUM CHLORIDE CRYS ER 20 MEQ PO TBCR: 20.0000 meq | EXTENDED_RELEASE_TABLET | Freq: Every day | ORAL | 2 refills | Status: DC

## 2022-01-09 ENCOUNTER — Ambulatory Visit (HOSPITAL_COMMUNITY)
Admission: RE | Admit: 2022-01-09 | Discharge: 2022-01-09 | Disposition: A | Discharge status: Home / Self Care | Payer: Medicare Other | Source: Ambulatory Visit | Attending: Internal Medicine | Admitting: Internal Medicine

## 2022-01-09 ENCOUNTER — Encounter (HOSPITAL_COMMUNITY): Payer: Self-pay | Admitting: Internal Medicine

## 2022-01-09 VITALS — BP 136/80 | HR 93 | Wt 213.4 lb

## 2022-01-09 DIAGNOSIS — N1832 Chronic kidney disease, stage 3b: Secondary | ICD-10-CM | POA: Insufficient documentation

## 2022-01-09 DIAGNOSIS — I4821 Permanent atrial fibrillation: Secondary | ICD-10-CM | POA: Insufficient documentation

## 2022-01-09 DIAGNOSIS — I130 Hypertensive heart and chronic kidney disease with heart failure and stage 1 through stage 4 chronic kidney disease, or unspecified chronic kidney disease: Secondary | ICD-10-CM | POA: Diagnosis not present

## 2022-01-09 DIAGNOSIS — Z7984 Long term (current) use of oral hypoglycemic drugs: Secondary | ICD-10-CM | POA: Insufficient documentation

## 2022-01-09 DIAGNOSIS — G4730 Sleep apnea, unspecified: Secondary | ICD-10-CM | POA: Insufficient documentation

## 2022-01-09 DIAGNOSIS — Z79899 Other long term (current) drug therapy: Secondary | ICD-10-CM | POA: Insufficient documentation

## 2022-01-09 DIAGNOSIS — I482 Chronic atrial fibrillation, unspecified: Secondary | ICD-10-CM

## 2022-01-09 DIAGNOSIS — I350 Nonrheumatic aortic (valve) stenosis: Secondary | ICD-10-CM

## 2022-01-09 DIAGNOSIS — I5033 Acute on chronic diastolic (congestive) heart failure: Secondary | ICD-10-CM | POA: Diagnosis not present

## 2022-01-09 DIAGNOSIS — I2510 Atherosclerotic heart disease of native coronary artery without angina pectoris: Secondary | ICD-10-CM | POA: Diagnosis not present

## 2022-01-09 DIAGNOSIS — Z7901 Long term (current) use of anticoagulants: Secondary | ICD-10-CM | POA: Diagnosis not present

## 2022-01-09 DIAGNOSIS — Z952 Presence of prosthetic heart valve: Secondary | ICD-10-CM | POA: Diagnosis not present

## 2022-01-09 DIAGNOSIS — I5032 Chronic diastolic (congestive) heart failure: Secondary | ICD-10-CM | POA: Diagnosis not present

## 2022-01-09 DIAGNOSIS — E1122 Type 2 diabetes mellitus with diabetic chronic kidney disease: Secondary | ICD-10-CM | POA: Diagnosis not present

## 2022-01-09 DIAGNOSIS — E785 Hyperlipidemia, unspecified: Secondary | ICD-10-CM | POA: Insufficient documentation

## 2022-01-09 LAB — BASIC METABOLIC PANEL
Anion gap: 9 (ref 5–15)
BUN: 19 mg/dL (ref 8–23)
CO2: 23 mmol/L (ref 22–32)
Calcium: 9.7 mg/dL (ref 8.9–10.3)
Chloride: 105 mmol/L (ref 98–111)
Creatinine, Ser: 0.94 mg/dL (ref 0.61–1.24)
GFR, Estimated: >60 mL/min (ref >60)
Glucose, Bld: 102 mg/dL — ABNORMAL HIGH (ref 70–99)
Potassium: 4.3 mmol/L (ref 3.5–5.1)
Sodium: 137 mmol/L (ref 135–145)

## 2022-01-09 LAB — CBC
HCT: 38.2 % — ABNORMAL LOW (ref 39.0–52.0)
Hemoglobin: 12 g/dL — ABNORMAL LOW (ref 13.0–17.0)
MCH: 26.1 pg (ref 26.0–34.0)
MCHC: 31.4 g/dL (ref 30.0–36.0)
MCV: 83 fL (ref 80.0–100.0)
Platelets: 174 10*3/uL (ref 150–400)
RBC: 4.6 MIL/uL (ref 4.22–5.81)
RDW: 20.5 % — ABNORMAL HIGH (ref 11.5–15.5)
WBC: 5.1 10*3/uL (ref 4.0–10.5)
nRBC: 0 % (ref 0.0–0.2)

## 2022-01-09 LAB — BRAIN NATRIURETIC PEPTIDE: B Natriuretic Peptide: 184.9 pg/mL — ABNORMAL HIGH (ref 0.0–100.0)

## 2022-01-09 MED ORDER — ROSUVASTATIN CALCIUM 10 MG PO TABS: 10.0000 mg | ORAL_TABLET | Freq: Every day | ORAL | 11 refills | Status: DC

## 2022-01-09 NOTE — Patient Instructions (Signed)
Labs done today, your results will be available in MyChart, we will contact you for abnormal readings.  Your physician recommends that you schedule a follow-up appointment in: 1 year with an echocardiogram (Oct 2024), **PLEASE CALL OUR OFFICE IN AUGUST TO SCHEDULE THESE APPOINTMENTS  If you have any questions or concerns before your next appointment please send [place] a message through [place] or call our office at [phone].    TO LEAVE A MESSAGE FOR THE NURSE SELECT OPTION 2, PLEASE LEAVE A MESSAGE INCLUDING: YOUR NAME DATE OF BIRTH CALL BACK NUMBER REASON FOR CALL**this is important as we prioritize the call backs  YOU WILL RECEIVE A CALL BACK THE SAME DAY AS LONG AS YOU CALL BEFORE 4:00 PM  At the [facility], you and your health needs are our priority. As part of our continuing mission to provide you with exceptional heart care, we have created designated Provider Care Teams. These Care Teams include your primary Cardiologist (physician) and Advanced Practice Providers (APPs- Physician Assistants and Nurse Practitioners) who all work together to provide you with the care you need, when you need it.   You may see any of the following providers on your designated Care Team at your next follow up: Dr Glori Bickers Dr Loralie Champagne Dr. Roxana Hires, NP Lyda Jester, [place] [facility] [place], [place] Forestine Na, NP Audry Riles, PharmD   Please be sure to bring in all your medications bottles to every appointment.

## 2022-01-09 NOTE — Progress Notes (Addendum)
Advanced Heart Failure Clinic Note   Date:  01/09/2022   ID:  [name] White, DOB 1935-05-16, MRN [id number]  Location: Home  Provider location: [place] [facility] Type of Visit: Established patient  PCP:  Plotnikov, [name] Lacks, MD  Cardiologist:  None Primary HF: [name] White  Chief Complaint: Heart Failure follow-up   History of Present Illness: [name] White is a delightful 86 year old male with a history of non-obstructive CAD, micturition syncope, hypertension, hyperlipidemia, borderline diabetes, obesity, sleep apnea, atrial flutter s/p a. flutter ablation by Dr. Lovena Le 06/2008, PAF, CML and severe AS s/p TAVR 04/13/19.    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 Being treated at [facility]. Has lost over 40 pounds. BP down so clonidine stopped.    Echo 10/20 EF 55-60% RV ok. Severe AS mean gradient 30mHG Small effusion. Mild to mod MR/TR. S/p TAVR on 04/13/19.  Echo 05/17/19 EF 60-65%. TAVR ok. RV ok. Mod TR   Echo 01/17/21: EF 60-65% TAVR stable. Severe biatrial enlargement.   He presents for routine f/u. Feeling good, no complaints. No SOB with ambulation/activity. Activity limited by bad balance. Taking all medications. No CP, SOB orthopnea or PND.   Cardiac studies: R/L cath 12/20 Prox RCA to Mid RCA lesion is 30% stenosed. RPAV lesion is 30% stenosed. Ost Cx to Prox Cx lesion is 50% stenosed. Prox LAD to Mid LAD lesion is 20% stenosed.   Findings: Ao = 82/51(65)  Lv = 138/8   RA = 5 RV = 38/6 PA = 37/13 (22) PCW = 14  Fick cardiac output/index = 9.8/4.3 PVR = 0.7 WU FA sat = 96% PA sat = 78%, 77% High SVC 75%   AoV = peak gradient 582mHG, mean gradient 36 mmHG, AVA 1.4 cm2    Past Medical History:  Diagnosis Date   Anemia 06/04/2015   Anxiety    Atrial flutter ([facility]   s/p ablation 07/17/08   Cancer ([facility]   leukemia - dx 05/2018   Chronic diastolic CHF (congestive heart failure) ([facility]   Coronary artery disease    Depression    Diabetes  mellitus type 2 in obese ([facility] [place]01/12/2009   Qualifier: Diagnosis of  By: JeArnoldo MoraleD, JoBalinda Quailsdiet controlled, lost 60lbs   Emphysema of lung ([facility]   GERD (gastroesophageal reflux disease)    Glaucoma    recent dx - has an appt to be evaluated and meds to treat   H/O hiatal hernia    Hearing loss    some hearing loss in left ear but has not been dx, no hearing aids   Hepatitis    h/o of type A   History of measles as a child    History of mumps as a child    Hypertension    Hypothyroidism    Micturition syncope    Osteoarthritis    Pleural effusion, bilateral    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   26 mm Edwards Sapien 3 transcatheter heart valve placed via percutaneous right transfemoral approach    Severe aortic stenosis    Sleep apnea    uses CPAP nightly   Vitamin D deficiency 11/24/2015   Past Surgical History:  Procedure Laterality Date   CARDIAC CATHETERIZATION  03/18/2019   CARDIAC ELECTROPHYSIOLOGY MAPPING AND ABLATION  06/2008   CATARACT EXTRACTION W/ INTRAOCULAR LENS  IMPLANT, BILATERAL  ~ 2007   COLONOSCOPY     FOOT TENDON SURGERY Left  INTRAOPERATIVE TRANSTHORACIC ECHOCARDIOGRAM N/A 04/13/2019   Procedure: TRANSTHORACIC ECHOCARDIOGRAM;  Surgeon: Sherren Mocha, MD;  Location: [place];  Service: Open Heart Surgery;  Laterality: N/A;   RIGHT/LEFT HEART CATH AND CORONARY ANGIOGRAPHY N/A 03/18/2019   Procedure: RIGHT/LEFT HEART CATH AND CORONARY ANGIOGRAPHY;  Surgeon: Jolaine Artist, MD;  Location: [place] [facility];  Service: Cardiovascular;  Laterality: N/A;   TONSILLECTOMY  1940's   TOTAL KNEE ARTHROPLASTY  1990's   right   TOTAL KNEE ARTHROPLASTY Left 08/02/2013   Procedure: LEFT TOTAL KNEE ARTHROPLASTY;  Surgeon: Gearlean Alf, MD;  Location: [facility];  Service: Orthopedics;  Laterality: Left;   TRANSCATHETER AORTIC VALVE REPLACEMENT, TRANSFEMORAL N/A 04/13/2019   Procedure: TRANSCATHETER AORTIC VALVE REPLACEMENT, TRANSFEMORAL;   Surgeon: Sherren Mocha, MD;  Location: [place];  Service: Open Heart Surgery;  Laterality: N/A;   UPPER GI ENDOSCOPY     WISDOM TOOTH EXTRACTION       Current Outpatient Medications  Medication Sig Dispense Refill   amLODipine (NORVASC) 10 MG tablet Take 10 mg by mouth daily.     amoxicillin (AMOXIL) 500 MG tablet Take 4 capsules (2,000 mg) one hour prior to all dental visits. 8 tablet 11   apixaban (ELIQUIS) 2.5 MG TABS tablet Take 1 tablet (2.5 mg total) by mouth 2 (two) times daily. 180 tablet 3   Brinzolamide-Brimonidine (SIMBRINZA) 1-0.2 % SUSP Place 1 drop into both eyes in the morning, at noon, and at bedtime.     carboxymethylcellulose (REFRESH PLUS) 0.5 % SOLN Place 1 drop into both eyes in the morning, at noon, in the evening, and at bedtime.     cholecalciferol (VITAMIN D3) 25 MCG (1000 UNIT) tablet Take 1,000 Units by mouth at bedtime.     CINNAMON PO Take 2,000 mg by mouth 2 (two) times daily.      Coenzyme Q10 (CVS COQ-10) 200 MG capsule Take 200 mg by mouth every evening.      empagliflozin (JARDIANCE) 25 MG TABS tablet Take 25 mg by mouth daily.     famotidine (PEPCID) 20 MG tablet Take 20 mg by mouth daily as needed for indigestion or heartburn.     ferrous sulfate 325 (65 FE) MG tablet Take 325 mg by mouth every evening.      finasteride (PROSCAR) 5 MG tablet Take 5 mg by mouth every evening.     ketotifen (ZADITOR) 0.025 % ophthalmic solution Place 1 drop into both eyes in the morning and at bedtime.     levothyroxine (SYNTHROID) 200 MCG tablet Take 200 mcg by mouth daily before breakfast.     loratadine (CLARITIN) 10 MG tablet TAKE 1 TABLET BY MOUTH EVERY DAY *NOT COVERED 90 tablet 3   MELATONIN PO Take 1 tablet by mouth at bedtime.     metFORMIN (GLUCOPHAGE) 500 MG tablet Take 500 mg by mouth daily with breakfast.     nilotinib (TASIGNA) 150 MG capsule Take 300 mg by mouth 2 (two) times daily. Take on an empty stomach, 1 hr before or 2 hrs after food.     oxyCODONE (OXY  IR/ROXICODONE) 5 MG immediate release tablet Take 1 tablet (5 mg total) by mouth every 6 (six) hours as needed for severe pain. 15 tablet 0   PARoxetine (PAXIL) 40 MG tablet Take 1 tablet (40 mg total) by mouth every morning. 90 tablet 1   potassium chloride SA (KLOR-CON M20) 20 MEQ tablet Take 1 tablet (20 mEq total) by mouth daily. 30 tablet 2  rosuvastatin (CRESTOR) 10 MG tablet Take 1 tablet (10 mg total) by mouth daily. Please schedule an appointment for further refills 30 tablet 0   spironolactone (ALDACTONE) 25 MG tablet Take 25 mg by mouth daily.     traZODone (DESYREL) 50 MG tablet Take 50 mg by mouth at bedtime. As needed     TURMERIC PO Take 1,000 mg by mouth every evening.      White Petrolatum-Mineral Oil (LUBRICANT EYE NIGHTTIME) OINT Place 1 application. into both eyes at bedtime.     No current facility-administered medications for this encounter.    Allergies:   Ace inhibitors, Benazepril, Codeine, Statins, Piroxicam, Pseudoephedrine, Brinzolamide-brimonidine, Dasatinib, and Betadine [povidone iodine]   Social History:  The patient  reports that he quit smoking about 25 years ago. His smoking use included pipe and cigars. He quit smokeless tobacco use about 12 years ago.  His smokeless tobacco use included chew. He reports that he does not currently use alcohol. He reports that he does not use drugs.   Family History:  The patient's family history includes Heart disease in his maternal grandfather and mother; Hypertension in his son; Multiple sclerosis in his father.   ROS:  Please see the history of present illness.   All other systems are personally reviewed and negative.   Vitals:   01/09/22 1211  BP: 136/80  Pulse: 93  SpO2: 99%    Exam:  General:  well, elderly appearing.  No respiratory difficulty HEENT: normal Neck: supple. JVD ~7 cm. Carotids 2+ bilat; no bruits. No lymphadenopathy or thyromegaly appreciated. Cor: PMI nondisplaced. Irregular rate & rhythm. No  rubs, gallops or murmurs. Lungs: clear Abdomen: soft, nontender, nondistended. No hepatosplenomegaly. No bruits or masses. Good bowel sounds. Extremities: no cyanosis, clubbing, rash, dependent edema L>R Neuro: alert & oriented x 3, cranial nerves grossly intact. moves all 4 extremities w/o difficulty. Affect pleasant.   ECG: AF 87 Personally reviewed   Recent Labs: 08/06/2021: BUN 27; Creatinine, Ser 1.05; Hemoglobin 10.8; Platelets 199; Potassium 4.8; Sodium 138   Wt Readings from Last 3 Encounters:  01/09/22 96.8 kg (213 lb 6.4 oz)  11/20/21 100.2 kg (221 lb)  08/16/21 99.8 kg (220 lb)      ASSESSMENT AND PLAN:  1) Chronic diastolic HF in setting of severe AS - Echo 01/17/21: EF 60-65% TAVR stable. Severe biatrial enlargement.  - Doing well NHYA I-II  - Since last visit, torsemide stopped and replaced with Jardiance 25 daily - Labs today  2) Severe AS - s/p TAVR 1/21 - Valve looks good on last echo - Aware of need for SBE prophylaxis   3) HTN - Blood pressure generally well controlled. Continue current regimen.  4) CAD - No current s/s ischemia.  - cath 12/20 with stable non-obstructive CAD - Continue Crestor Not on ASA due to Eliquis. Not on BB with bradycardia   5) CML - Following with Dr. Don Broach at [facility] - On nilotinib   6) Atrial fibrillation, permanent - Tikosyn stopped 5/17 due to persistent AF. - Remains in AF today. Rate ok.  - Continue Eliquis 2.5 bid , increase to 5 BID last Cr 1.05  7) Carotid Stenosis - Carotid duplex 12/2013; stable 0-39% bilateral ICA stenosis - Continue Crestor - F/u as needed   8) DM2 - Continue Jardiance '25mg'$  daily  9) CKD 3b - Creatinine 1.05 in May - labs ordered   Signed, Glori Bickers, MD  01/09/2022 12:32 PM  [facility] [address] [phone] (office) [phone] (fax)  Patient seen and examined  with the above-signed Advanced Practice Provider and/or Housestaff. I personally reviewed laboratory data, imaging studies and relevant notes. I independently examined the patient and formulated the important aspects of the plan. I have edited the note to reflect any of my changes or salient points. I have personally discussed the plan with the patient and/or family.  Doing well. Feels much better after TAVR. No CP, edema, orthopnea or PND. Remains on treatment for CML. BP ok.  Remains in AF.   General:  Well appearing. No resp difficulty HEENT: normal Neck: supple. no JVD. Carotids 2+ bilat; no bruits. No lymphadenopathy or thryomegaly appreciated. Cor: PMI nondisplaced. Irregular rate & rhythm. No rubs, gallops or murmurs. Lungs: clear Abdomen: soft, nontender, nondistended. No hepatosplenomegaly. No bruits or masses. Good bowel sounds. Extremities: no cyanosis, clubbing, rash, edema Neuro: alert & orientedx3, cranial nerves grossly intact. moves all 4 extremities w/o difficulty. Affect pleasant  Doing very well NYHA I. No angina. Will repeat BMET. If SCr < 1.5 will increase to Eliquis to 5 bid. Aware of SBE prophylaxis. F/u 1 year with echo  Glori Bickers, MD  12:36 PM

## 2022-02-18 ENCOUNTER — Ambulatory Visit (INDEPENDENT_AMBULATORY_CARE_PROVIDER_SITE_OTHER): Payer: Medicare Other

## 2022-02-18 DIAGNOSIS — Z0000 Encounter for general adult medical examination without abnormal findings: Secondary | ICD-10-CM

## 2022-02-18 NOTE — Progress Notes (Cosign Needed Addendum)
Virtual Visit via Telephone Note  I connected with  [name] White on 02/18/22 at  1:45 PM EST by telephone and verified that I am speaking with the correct person using two identifiers.  Location: Patient: Home Provider: [place] Persons participating in the virtual visit: [place]   I discussed the limitations, risks, security and privacy concerns of performing an evaluation and management service by telephone and the availability of in person appointments. The patient expressed understanding and agreed to proceed.  Interactive audio and video telecommunications were attempted between this nurse and patient, however failed, due to patient having technical difficulties OR patient did not have access to video capability.  We continued and completed visit with audio only.  Some vital signs may be absent or patient reported.   Sheral Flow, LPN  Subjective:   [name] White is a 86 y.o. male who presents for Medicare Annual/Subsequent preventive examination.  Review of Systems     Cardiac Risk Factors include: advanced age (>4mn, >>62women);diabetes mellitus;dyslipidemia;family history of premature cardiovascular disease;hypertension;male gender     Objective:    There were no vitals filed for this visit. There is no height or weight on file to calculate BMI.     02/18/2022    1:56 PM 08/16/2021    9:53 AM 08/06/2021   10:07 AM 02/16/2021    3:08 PM 02/13/2020   10:09 AM 12/22/2019   11:13 AM 04/13/2019    8:50 AM  Advanced Directives  Does Patient Have a Medical Advance Directive? Yes Yes Yes Yes Yes Yes Yes  Type of AParamedicof [place]ikeLiving will [place]Living will [place]Living will Living will;Healthcare Power of [place] will [place] Landingwill  Does patient want to make changes to medical advance  directive?  No - Patient declined  No - Patient declined  No - Patient declined No - Patient declined  Copy of H[place]istonin Chart? No - copy requested No - copy requested  No - copy requested  No - copy requested     Current Medications (verified) Outpatient Encounter Medications as of 02/18/2022  Medication Sig   acetaminophen (TYLENOL) 500 MG tablet Take 500 mg by mouth every 6 (six) hours as needed.   amLODipine (NORVASC) 10 MG tablet Take 10 mg by mouth daily.   amoxicillin (AMOXIL) 500 MG tablet Take 4 capsules (2,000 mg) one hour prior to all dental visits.   apixaban (ELIQUIS) 2.5 MG TABS tablet Take 1 tablet (2.5 mg total) by mouth 2 (two) times daily.   Brinzolamide-Brimonidine (SIMBRINZA) 1-0.2 % SUSP Place 1 drop into both eyes in the morning, at noon, and at bedtime.   carboxymethylcellulose (REFRESH PLUS) 0.5 % SOLN Place 1 drop into both eyes in the morning, at noon, in the evening, and at bedtime.   cholecalciferol (VITAMIN D3) 25 MCG (1000 UNIT) tablet Take 1,000 Units by mouth at bedtime.   CINNAMON PO Take 2,000 mg by mouth 2 (two) times daily.    Coenzyme Q10 ([employer] COQ-10) 200 MG capsule Take 200 mg by mouth every evening.    empagliflozin (JARDIANCE) 25 MG TABS tablet Take 25 mg by mouth daily.   famotidine (PEPCID) 20 MG tablet Take 20 mg by mouth daily as needed for indigestion or heartburn.   ferrous sulfate 325 (65 FE) MG tablet Take 325 mg by mouth every evening.  finasteride (PROSCAR) 5 MG tablet Take 5 mg by mouth every evening.   ketotifen (ZADITOR) 0.025 % ophthalmic solution Place 1 drop into both eyes in the morning and at bedtime.   levothyroxine (SYNTHROID) 200 MCG tablet Take 200 mcg by mouth daily before breakfast.   loratadine (CLARITIN) 10 MG tablet TAKE 1 TABLET BY MOUTH EVERY DAY *NOT COVERED   MELATONIN PO Take 1 tablet by mouth at bedtime.   metFORMIN (GLUCOPHAGE) 500 MG tablet Take 500 mg by mouth daily with breakfast.   nilotinib  (TASIGNA) 150 MG capsule Take 300 mg by mouth 2 (two) times daily. Take on an empty stomach, 1 hr before or 2 hrs after food.   oxyCODONE (OXY IR/ROXICODONE) 5 MG immediate release tablet Take 1 tablet (5 mg total) by mouth every 6 (six) hours as needed for severe pain.   PARoxetine (PAXIL) 40 MG tablet Take 1 tablet (40 mg total) by mouth every morning.   potassium chloride SA (KLOR-CON M20) 20 MEQ tablet Take 1 tablet (20 mEq total) by mouth daily.   rosuvastatin (CRESTOR) 10 MG tablet Take 1 tablet (10 mg total) by mouth daily. Please schedule an appointment for further refills   spironolactone (ALDACTONE) 25 MG tablet Take 25 mg by mouth daily.   traZODone (DESYREL) 50 MG tablet Take 50 mg by mouth at bedtime. As needed   TURMERIC PO Take 1,000 mg by mouth every evening.    White Petrolatum-Mineral Oil (LUBRICANT EYE NIGHTTIME) OINT Place 1 application. into both eyes at bedtime.   No facility-administered encounter medications on file as of 02/18/2022.    Allergies (verified) Ace inhibitors, Benazepril, Codeine, Statins, Piroxicam, Pseudoephedrine, Brinzolamide-brimonidine, Dasatinib, and Betadine [povidone iodine]   History: Past Medical History:  Diagnosis Date   Anemia 06/04/2015   Anxiety    Atrial flutter ([place])    s/p ablation 07/17/08   Cancer ([place])    leukemia - dx 05/2018   Chronic diastolic CHF (congestive heart failure) ([place])    Coronary artery disease    Depression    Diabetes mellitus type 2 in obese ([place]) 04/10/2009   Qualifier: Diagnosis of  By: Arnoldo Morale MD, Balinda Quails, diet controlled, lost 60lbs   Emphysema of lung ([place])    GERD (gastroesophageal reflux disease)    Glaucoma    recent dx - has an appt to be evaluated and meds to treat   H/O hiatal hernia    Hearing loss    some hearing loss in left ear but has not been dx, no hearing aids   Hepatitis    h/o of type A   History of measles as a child    History of mumps as a child    Hypertension    Hypothyroidism     Micturition syncope    Osteoarthritis    Pleural effusion, bilateral    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   26 mm Edwards Sapien 3 transcatheter heart valve placed via percutaneous right transfemoral approach    Severe aortic stenosis    Sleep apnea    uses CPAP nightly   Vitamin D deficiency 11/24/2015   Past Surgical History:  Procedure Laterality Date   CARDIAC CATHETERIZATION  03/18/2019   CARDIAC ELECTROPHYSIOLOGY MAPPING AND ABLATION  06/2008   CATARACT EXTRACTION W/ INTRAOCULAR LENS  IMPLANT, BILATERAL  ~ 2007   COLONOSCOPY     FOOT TENDON SURGERY Left    INTRAOPERATIVE TRANSTHORACIC ECHOCARDIOGRAM N/A 04/13/2019   Procedure: TRANSTHORACIC ECHOCARDIOGRAM;  Surgeon: Burt Knack,  Legrand Como, MD;  Location: [facility];  Service: Open Heart Surgery;  Laterality: N/A;   RIGHT/LEFT HEART CATH AND CORONARY ANGIOGRAPHY N/A 03/18/2019   Procedure: RIGHT/LEFT HEART CATH AND CORONARY ANGIOGRAPHY;  Surgeon: Jolaine Artist, MD;  Location: [facility];  Service: Cardiovascular;  Laterality: N/A;   TONSILLECTOMY  1940's   TOTAL KNEE ARTHROPLASTY  1990's   right   TOTAL KNEE ARTHROPLASTY Left 08/02/2013   Procedure: LEFT TOTAL KNEE ARTHROPLASTY;  Surgeon: Gearlean Alf, MD;  Location: [facility];  Service: Orthopedics;  Laterality: Left;   TRANSCATHETER AORTIC VALVE REPLACEMENT, TRANSFEMORAL N/A 04/13/2019   Procedure: TRANSCATHETER AORTIC VALVE REPLACEMENT, TRANSFEMORAL;  Surgeon: Sherren Mocha, MD;  Location: [facility];  Service: Open Heart Surgery;  Laterality: N/A;   UPPER GI ENDOSCOPY     WISDOM TOOTH EXTRACTION     Family History  Problem Relation Age of Onset   Multiple sclerosis Father    Heart disease Mother        atrial fibrillation   Hypertension Son    Heart disease Maternal Grandfather    Social History   Socioeconomic History   Marital status: Married    Spouse name: Not on file   Number of children: Not on file   Years of education: Not on file    Highest education level: Not on file  Occupational History   Not on file  Tobacco Use   Smoking status: Former    Types: Pipe, Cigars    Quit date: 04/01/1996    Years since quitting: 25.9   Smokeless tobacco: Former    Types: Chew    Quit date: 04/01/2009  Vaping Use   Vaping Use: Never used  Substance and Sexual Activity   Alcohol use: Not Currently    Alcohol/week: 0.0 standard drinks of alcohol    Comment: Quit wine early 2020.   Drug use: No   Sexual activity: Not Currently    Comment: lives with wife, eats clean diet. No major dietary restrtictions,   Other Topics Concern   Not on file  Social History Narrative   Not on file   Social Determinants of Health   Financial Resource Strain: Low Risk  (02/18/2022)   Overall Financial Resource Strain (CARDIA)    Difficulty of Paying Living Expenses: Not hard at all  Food Insecurity: No Food Insecurity (02/18/2022)   Hunger Vital Sign    Worried About Running Out of Food in the Last Year: Never true    Ran Out of Food in the Last Year: Never true  Transportation Needs: No Transportation Needs (02/18/2022)   PRAPARE - Hydrologist (Medical): No    Lack of Transportation (Non-Medical): No  Physical Activity: Sufficiently Active (02/18/2022)   Exercise Vital Sign    Days of Exercise per Week: 5 days    Minutes of Exercise per Session: 30 min  Stress: No Stress Concern Present (02/18/2022)   [place]    Feeling of Stress : Not at all  Social Connections: [place] (02/18/2022)   Social Connection and Isolation Panel [NHANES]    Frequency of Communication with Friends and Family: More than three times a week    Frequency of Social Gatherings with Friends and Family: More than three times a week    Attends Religious Services: More than 4 times per year    Active Member of [employer] or Organizations: Yes    Attends Club or  Organization Meetings: More than 4 times per year    Marital Status: Married    Tobacco Counseling Counseling given: Not Answered   Clinical Intake:  Pre-visit preparation completed: Yes  Pain : No/denies pain     BMI - recorded: 28.37 (11/20/2021) Nutritional Status: BMI 25 -29 Overweight Nutritional Risks: None Diabetes: No  How often do you need to have someone help you when you read instructions, pamphlets, or other written materials from your doctor or pharmacy?: 1 - Never What is the last grade level you completed in school?: HS[employer] Interpreter Needed?: No  Information entered by :: Lisette Abu, LPN.  Prediabetes:  yes  Activities of Daily Living    02/18/2022    1:57 PM 08/06/2021   10:09 AM  In your present state of health, do you have any difficulty performing the following activities:  Hearing? 0   Vision? 0   Difficulty concentrating or making decisions? 0   Walking or climbing stairs? 1   Dressing or bathing? 0   Doing errands, shopping? 0 0  Preparing Food and eating ? N   Using the Toilet? N   In the past six months, have you accidently leaked urine? N   Do you have problems with loss of bowel control? N   Managing your Medications? N   Managing your Finances? N   Housekeeping or managing your Housekeeping? N     Patient Care Team: Plotnikov, Evie Lacks, MD as PCP - General (Internal Medicine) Bensimhon, Shaune Pascal, MD as PCP - Advanced Heart Failure (Cardiology) Mercy Moore, OD as Consulting Physician (Optometry) Bensimhon, Shaune Pascal, MD as Consulting Physician (Cardiology) Gaynelle Arabian, MD as Consulting Physician (Orthopedic Surgery) Kerrie Buffalo, MD as Consulting Physician (Cardiology) Henreitta Cea, MD as Consulting Physician (Family Medicine) Garry Heater, DDS (Dentistry) Sherren Mocha, MD as Consulting Physician (Cardiology) Evans Lance, MD as Consulting Physician (Cardiology)  Indicate any recent Medical Services you may  have received from other than [facility] providers in the past year (date may be approximate).     Assessment:   This is a routine wellness examination for [name] White.  Hearing/Vision screen Hearing Screening - Comments:: Denies hearing difficulties No hearing aids  Vision Screening - Comments:: Wears rx glasses - up to date with routine eye exams with [facility]   Dietary issues and exercise activities discussed: Current Exercise Habits: The patient does not participate in regular exercise at present, Exercise limited by: cardiac condition(s)   Goals Addressed             This Visit's Progress    To maintain my current health status by continuing to eat healthy, stay physically active and socially active.        Depression Screen    02/18/2022    1:50 PM 05/21/2021   10:54 AM 02/16/2021    3:10 PM 05/15/2020    2:35 PM 12/22/2019   11:12 AM 12/22/2019   11:09 AM 09/21/2019    3:51 PM  PHQ 2/9 Scores  PHQ - 2 Score 0 0 0 0 0 0 0  PHQ- 9 Score    1   0    Fall Risk    02/18/2022    1:56 PM 05/21/2021   10:54 AM 02/16/2021    3:08 PM 12/22/2019   11:09 AM 09/20/2019    1:19 PM  Fall Risk   Falls in the past year? 0 1 0 0 0  Number falls in past yr: 0 1 0 0 0  Injury with Fall? 0  0 0 0  Risk for fall due to : No Fall Risks  No Fall Risks Impaired balance/gait;Impaired vision   Follow up Falls prevention discussed  Falls evaluation completed Falls evaluation completed;Education provided     FALL RISK PREVENTION PERTAINING TO THE HOME:  Any stairs in or around the home? Yes  has an elevator If so, are there any without handrails? No  Home free of loose throw rugs in walkways, pet beds, electrical cords, etc? Yes  Adequate lighting in your home to reduce risk of falls? Yes   ASSISTIVE DEVICES UTILIZED TO PREVENT FALLS:  Life alert? No  Use of a cane, walker or w/c? Yes  Grab bars in the bathroom? Yes  Shower chair or bench in shower? Yes  Elevated toilet seat or a  handicapped toilet? Yes   TIMED UP AND GO:  Was the test performed? No . Phone Visit   Cognitive Function:    07/29/2016    9:05 AM  MMSE - Mini Mental State Exam  Orientation to time 5  Orientation to Place 5  Registration 3  Attention/ Calculation 4  Recall 3  Language- name 2 objects 2  Language- repeat 1  Language- follow 3 step command 3  Language- read & follow direction 1  Write a sentence 1  Copy design 1  Total score 29        12/22/2019   11:13 AM  6CIT Screen  What Year? 0 points  What month? 0 points  What time? 0 points  Count back from 20 0 points  Months in reverse 0 points  Repeat phrase 0 points  Total Score 0 points    Immunizations Immunization History  Administered Date(s) Administered   Fluad Quad(high Dose 65+) 01/20/2020, 01/31/2020, 03/15/2021   Influenza Split 12/31/2011   Influenza Whole 01/11/2009, 02/08/2010   Influenza, High Dose Seasonal PF 12/05/2010, 12/31/2011, 01/12/2013, 11/24/2015, 02/12/2016, 01/10/2017, 12/30/2017, 12/17/2018   Influenza,inj,Quad PF,6+ Mos 02/01/2014, 12/15/2014   Influenza-Unspecified 12/30/2001, 12/31/2002, 12/31/2003, 01/30/2005, 02/08/2005, 01/20/2006, 12/18/2006, 01/31/2008, 11/30/2008, 12/18/2017, 12/19/2018, 01/01/2021, 01/18/2022   Moderna Sars-Covid-2 Vaccination 05/04/2019, 06/02/2019, 01/20/2020   Pneumococcal Conjugate-13 11/24/2015, 10/10/2016   Pneumococcal Polysaccharide-23 10/29/2008   Pneumococcal-Unspecified 12/23/2003   Td 11/15/2002, 04/30/2008   Tdap 09/25/2011    TDAP status: Due, Education has been provided regarding the importance of this vaccine. Advised may receive this vaccine at local pharmacy or Health Dept. Aware to provide a copy of the vaccination record if obtained from local pharmacy or Health Dept. Verbalized acceptance and understanding.  Flu Vaccine status: Up to date  Pneumococcal vaccine status: Up to date  Covid-19 vaccine status: Completed vaccines  Qualifies  for Shingles Vaccine? Yes   Zostavax completed No   Shingrix Completed?: No.    Education has been provided regarding the importance of this vaccine. Patient has been advised to call insurance company to determine out of pocket expense if they have not yet received this vaccine. Advised may also receive vaccine at local pharmacy or Health Dept. Verbalized acceptance and understanding.  Screening Tests Health Maintenance  Topic Date Due   Zoster Vaccines- Shingrix (1 of 2) Never done   FOOT EXAM  07/29/2017   Diabetic kidney evaluation - Urine ACR  01/10/2018   OPHTHALMOLOGY EXAM  11/20/2018   COVID-19 Vaccine (4 - Moderna risk series) 03/16/2020   HEMOGLOBIN A1C  02/06/2022   Diabetic kidney evaluation - GFR measurement  01/10/2023   Medicare Annual Wellness (AWV)  02/19/2023  Pneumonia Vaccine 70+ Years old  Completed   INFLUENZA VACCINE  Completed   HPV VACCINES  Aged Out    Health Maintenance  Health Maintenance Due  Topic Date Due   Zoster Vaccines- Shingrix (1 of 2) Never done   FOOT EXAM  07/29/2017   Diabetic kidney evaluation - Urine ACR  01/10/2018   OPHTHALMOLOGY EXAM  11/20/2018   COVID-19 Vaccine (4 - Moderna risk series) 03/16/2020   HEMOGLOBIN A1C  02/06/2022    Colorectal cancer screening: No longer required.   Lung Cancer Screening: (Low Dose CT Chest recommended if Age 17-80 years, 30 pack-year currently smoking OR have quit w/in 15years.) does not qualify.   Lung Cancer Screening Referral: no  Additional Screening:  Hepatitis C Screening: does not qualify; Completed no  Vision Screening: Recommended annual ophthalmology exams for early detection of glaucoma and other disorders of the eye. Is the patient up to date with their annual eye exam?  Yes  Who is the provider or what is the name of the office in which the patient attends annual eye exams? [facility] If pt is not established with a provider, would they like to be referred to a provider to  establish care? No .   Dental Screening: Recommended annual dental exams for proper oral hygiene  Community Resource Referral / Chronic Care Management: CRR required this visit?  No   CCM required this visit?  No      Plan:     I have personally reviewed and noted the following in the patient's chart:   Medical and social history Use of alcohol, tobacco or illicit drugs  Current medications and supplements including opioid prescriptions. Patient is currently taking opioid prescriptions. Information provided to patient regarding non-opioid alternatives. Patient advised to discuss non-opioid treatment plan with their provider. Functional ability and status Nutritional status Physical activity Advanced directives List of other physicians Hospitalizations, surgeries, and ER visits in previous 12 months Vitals Screenings to include cognitive, depression, and falls Referrals and appointments  In addition, I have reviewed and discussed with patient certain preventive protocols, quality metrics, and best practice recommendations. A written personalized care plan for preventive services as well as general preventive health recommendations were provided to patient.     Sheral Flow, LPN   [id number]   Nurse Notes: N/A  Medical screening examination/treatment/procedure(s) were performed by non-physician practitioner and as supervising physician I was immediately available for consultation/collaboration.  I agree with above. Lew Dawes, MD

## 2022-02-18 NOTE — Patient Instructions (Addendum)
[name] , Thank you for taking time to come for your Medicare Wellness Visit. I appreciate your ongoing commitment to your health goals. Please review the following plan we discussed and let me know if I can assist you in the future.   These are the goals we discussed:  Goals      To maintain my current health status by continuing to eat healthy, stay physically active and socially active.        This is a list of the screening recommended for you and due dates:  Health Maintenance  Topic Date Due   Zoster (Shingles) Vaccine (1 of 2) Never done   Complete foot exam   07/29/2017   Yearly kidney health urinalysis for diabetes  01/10/2018   Eye exam for diabetics  11/20/2018   COVID-19 Vaccine (4 - Moderna risk series) 03/16/2020   Hemoglobin A1C  02/06/2022   Yearly kidney function blood test for diabetes  01/10/2023   Medicare Annual Wellness Visit  02/19/2023   Pneumonia Vaccine  Completed   Flu Shot  Completed   HPV Vaccine  Aged Out    Advanced directives: Yes; Please bring a copy of your health care power of attorney and living will to the office at your convenience.  Conditions/risks identified: Yes; Type II Diabetes Mellitus  Next appointment: Follow up in one year for your annual wellness visit on 02/20/2023 at 1:00 p.m. telephone visit with Mignon Pine, Nurse Health Advisor.  If you need to reschedule or cancel, please call [phone].  Preventive Care 84 Years and Older, Male  Preventive care refers to lifestyle choices and visits with your health care provider that can promote health and wellness. What does preventive care include? A yearly physical exam. This is also called an annual well check. Dental exams once or twice a year. Routine eye exams. Ask your health care provider how often you should have your eyes checked. Personal lifestyle choices, including: Daily care of your teeth and gums. Regular physical activity. Eating a healthy diet. Avoiding tobacco  and drug use. Limiting alcohol use. Practicing safe sex. Taking low doses of aspirin every day. Taking vitamin and mineral supplements as recommended by your health care provider. What happens during an annual well check? The services and screenings done by your health care provider during your annual well check will depend on your age, overall health, lifestyle risk factors, and family history of disease. Counseling  Your health care provider may ask you questions about your: Alcohol use. Tobacco use. Drug use. Emotional well-being. Home and relationship well-being. Sexual activity. Eating habits. History of falls. Memory and ability to understand (cognition). Work and work Statistician. Screening  You may have the following tests or measurements: Height, weight, and BMI. Blood pressure. Lipid and cholesterol levels. These may be checked every 5 years, or more frequently if you are over 45 years old. Skin check. Lung cancer screening. You may have this screening every year starting at age 61 if you have a 30-pack-year history of smoking and currently smoke or have quit within the past 15 years. Fecal occult blood test (FOBT) of the stool. You may have this test every year starting at age 34. Flexible sigmoidoscopy or colonoscopy. You may have a sigmoidoscopy every 5 years or a colonoscopy every 10 years starting at age 24. Prostate cancer screening. Recommendations will vary depending on your family history and other risks. Hepatitis C blood test. Hepatitis B blood test. Sexually transmitted disease (STD) testing. Diabetes screening.  This is done by checking your blood sugar (glucose) after you have not eaten for a while (fasting). You may have this done every 1-3 years. Abdominal aortic aneurysm (AAA) screening. You may need this if you are a current or former smoker. Osteoporosis. You may be screened starting at age 31 if you are at high risk. Talk with your health care provider  about your test results, treatment options, and if necessary, the need for more tests. Vaccines  Your health care provider may recommend certain vaccines, such as: Influenza vaccine. This is recommended every year. Tetanus, diphtheria, and acellular pertussis (Tdap, Td) vaccine. You may need a Td booster every 10 years. Zoster vaccine. You may need this after age 55. Pneumococcal 13-valent conjugate (PCV13) vaccine. One dose is recommended after age 63. Pneumococcal polysaccharide (PPSV23) vaccine. One dose is recommended after age 37. Talk to your health care provider about which screenings and vaccines you need and how often you need them. This information is not intended to replace advice given to you by your health care provider. Make sure you discuss any questions you have with your health care provider. Document Released: 04/14/2015 Document Revised: 12/06/2015 Document Reviewed: 01/17/2015 Elsevier Interactive Patient Education  2017 [place] Prevention in the Home Falls can cause injuries. They can happen to people of all ages. There are many things you can do to make your home safe and to help prevent falls. What can I do on the outside of my home? Regularly fix the edges of walkways and driveways and fix any cracks. Remove anything that might make you trip as you walk through a door, such as a raised step or threshold. Trim any bushes or trees on the path to your home. Use bright outdoor lighting. Clear any walking paths of anything that might make someone trip, such as rocks or tools. Regularly check to see if handrails are loose or broken. Make sure that both sides of any steps have handrails. Any raised decks and porches should have guardrails on the edges. Have any leaves, snow, or ice cleared regularly. Use sand or salt on walking paths during winter. Clean up any spills in your garage right away. This includes oil or grease spills. What can I do in the  bathroom? Use night lights. Install grab bars by the toilet and in the tub and shower. Do not use towel bars as grab bars. Use non-skid mats or decals in the tub or shower. If you need to sit down in the shower, use a plastic, non-slip stool. Keep the floor dry. Clean up any water that spills on the floor as soon as it happens. Remove soap buildup in the tub or shower regularly. Attach bath mats securely with double-sided non-slip rug tape. Do not have throw rugs and other things on the floor that can make you trip. What can I do in the bedroom? Use night lights. Make sure that you have a light by your bed that is easy to reach. Do not use any sheets or blankets that are too big for your bed. They should not hang down onto the floor. Have a firm chair that has side arms. You can use this for support while you get dressed. Do not have throw rugs and other things on the floor that can make you trip. What can I do in the kitchen? Clean up any spills right away. Avoid walking on wet floors. Keep items that you use a lot in easy-to-reach places. If  you need to reach something above you, use a strong step stool that has a grab bar. Keep electrical cords out of the way. Do not use floor polish or wax that makes floors slippery. If you must use wax, use non-skid floor wax. Do not have throw rugs and other things on the floor that can make you trip. What can I do with my stairs? Do not leave any items on the stairs. Make sure that there are handrails on both sides of the stairs and use them. Fix handrails that are broken or loose. Make sure that handrails are as long as the stairways. Check any carpeting to make sure that it is firmly attached to the stairs. Fix any carpet that is loose or worn. Avoid having throw rugs at the top or bottom of the stairs. If you do have throw rugs, attach them to the floor with carpet tape. Make sure that you have a light switch at the top of the stairs and the  bottom of the stairs. If you do not have them, ask someone to add them for you. What else can I do to help prevent falls? Wear shoes that: Do not have high heels. Have rubber bottoms. Are comfortable and fit you well. Are closed at the toe. Do not wear sandals. If you use a stepladder: Make sure that it is fully opened. Do not climb a closed stepladder. Make sure that both sides of the stepladder are locked into place. Ask someone to hold it for you, if possible. Clearly mark and make sure that you can see: Any grab bars or handrails. First and last steps. Where the edge of each step is. Use tools that help you move around (mobility aids) if they are needed. These include: Canes. Walkers. Scooters. Crutches. Turn on the lights when you go into a dark area. Replace any light bulbs as soon as they burn out. Set up your furniture so you have a clear path. Avoid moving your furniture around. If any of your floors are uneven, fix them. If there are any pets around you, be aware of where they are. Review your medicines with your doctor. Some medicines can make you feel dizzy. This can increase your chance of falling. Ask your doctor what other things that you can do to help prevent falls. This information is not intended to replace advice given to you by your health care provider. Make sure you discuss any questions you have with your health care provider. Document Released: 01/12/2009 Document Revised: 08/24/2015 Document Reviewed: 04/22/2014 Elsevier Interactive Patient Education  2017 [employer].

## 2022-02-26 ENCOUNTER — Ambulatory Visit: Payer: Self-pay | Admitting: Licensed Clinical Social Worker

## 2022-02-26 NOTE — Patient Instructions (Signed)
    It was a pleasure speaking with you today. Per your request a Care Coordination phone appointment is scheduled 02/27/22  Casimer Lanius, [place] [phone]

## 2022-02-26 NOTE — Patient Outreach (Signed)
  Care Coordination   Initial Visit Note   02/26/2022 Name: [name] White MRN: [id number] DOB: Feb 10, 1936  [name] White is a 86 y.o. year old male who sees Plotnikov, Evie Lacks, MD for primary care. I spoke with  [name] White by phone today.  What matters to the patients health and wellness today?  Phone appointment scheduled   SDOH assessments and interventions completed:  No    Care Coordination Interventions:  No, not indicated   Follow up plan: Follow up call scheduled for 02/27/22    Encounter Outcome:  Pt. Visit Completed   [name] White, [place] [phone]

## 2022-02-27 ENCOUNTER — Ambulatory Visit: Payer: Self-pay | Admitting: Licensed Clinical Social Worker

## 2022-02-27 NOTE — Patient Outreach (Signed)
  Care Coordination   Initial Visit Note   02/27/2022 Name: [name] White MRN: [id number] DOB: 10-02-1935  [name] White is a 86 y.o. year old male who sees Plotnikov, Evie Lacks, MD for primary care. I spoke with  [name] White by phone today.  What matters to the patients health and wellness today?    Patient reports no concerns or needs from Care Coordination team with health and wellness related to physical or mental heath.  He is doing well with managing his health at this time .   Recommendation: Patient may benefit from, and is in agreement to [place] home support options.     Goals Addressed             This Visit's Progress    COMPLETED: Care Coordination Activities No Follow up Required       Care Coordination Interventions: Reviewed Care Coordination Services:Declined Medicare Annual Wellness Visit: Completed Concerns with obtaining required medications: None Assessed Social Determinants of Health Advised patient to Discuss in-home support options as next [place] visit Solution-Focused Strategies employed:  Problem [place] strategies reviewed Caregiver stress acknowledged :with managing the home needs Discussed long-term care planning          SDOH assessments and interventions completed:  Yes  SDOH Interventions Today    Flowsheet Row Most Recent Value  SDOH Interventions   Food Insecurity Interventions Intervention Not Indicated  Housing Interventions Intervention Not Indicated  Transportation Interventions Intervention Not Indicated  Stress Interventions Intervention Not Indicated        Care Coordination Interventions:  Yes, provided   Follow up plan:  No further intervention required. Patient reports he will call if needed    Encounter Outcome:  Pt. Visit Completed   Casimer Lanius, [place] [phone]

## 2022-02-27 NOTE — Patient Instructions (Signed)
Visit Information  Thank you for taking time to visit with me today. Please don't hesitate to contact me if I can be of assistance to you.   Following are the goals we discussed today:   Goals Addressed             This Visit's Progress    COMPLETED: Care Coordination Activities No Follow up Required       Care Coordination Interventions: [place] Medicare Annual Wellness Visit: Completed Concerns with obtaining required medications: None Assessed Social Determinants of Health Advised patient to Discuss in-home support options as next [place] visit Solution-Focused Strategies employed:  Problem [place] strategies reviewed Caregiver stress acknowledged :with managing the home needs Discussed long-term care planning          Patient verbalizes understanding of instructions and care plan provided today and agrees to view in [place]. Active MyChart status and patient understanding of how to access instructions and care plan via MyChart confirmed with patient.     No further follow up required: By Care Coordination at this time   Care Coordination provides support specific to your health needs that extend beyond exceptional routine office care you already receive from your primary care doctor.    If you are eligible for standard Care Coordination, there is no cost to you.  The Care Coordination team is made up of the following team members: Registered Nurse Care Guide: disease management, health education, care coordination and complex case management Clinical Social Work: Complex Care Coordination including coordination of level of care needs, mental and behavioral health assessment and recommendations, and connection to long-term mental health support Clinical Pharmacist: medication management, assistance and disease management Community Resource Care Guides: Forensic psychologist Team: dedicated team of scheduling  professionals to support patient and clinical team scheduling needs  Please call [phone] if you would like to schedule a phone appointment with one of the team members.     Casimer Lanius, [place] [phone]

## 2022-03-30 ENCOUNTER — Other Ambulatory Visit (HOSPITAL_COMMUNITY): Payer: Self-pay | Admitting: Internal Medicine

## 2022-04-20 DIAGNOSIS — M25561 Pain in right knee: Secondary | ICD-10-CM | POA: Diagnosis not present

## 2022-04-25 DIAGNOSIS — C44219 Basal cell carcinoma of skin of left ear and external auricular canal: Secondary | ICD-10-CM | POA: Diagnosis not present

## 2022-04-25 DIAGNOSIS — D485 Neoplasm of uncertain behavior of skin: Secondary | ICD-10-CM | POA: Diagnosis not present

## 2022-04-29 DIAGNOSIS — M25561 Pain in right knee: Secondary | ICD-10-CM | POA: Diagnosis not present

## 2022-04-29 DIAGNOSIS — M25562 Pain in left knee: Secondary | ICD-10-CM | POA: Diagnosis not present

## 2022-05-08 DIAGNOSIS — C44219 Basal cell carcinoma of skin of left ear and external auricular canal: Secondary | ICD-10-CM | POA: Diagnosis not present

## 2022-05-09 DIAGNOSIS — E119 Type 2 diabetes mellitus without complications: Secondary | ICD-10-CM | POA: Diagnosis not present

## 2022-05-21 ENCOUNTER — Ambulatory Visit (INDEPENDENT_AMBULATORY_CARE_PROVIDER_SITE_OTHER): Payer: Medicare Other | Admitting: Internal Medicine

## 2022-05-21 ENCOUNTER — Encounter: Payer: Self-pay | Admitting: Internal Medicine

## 2022-05-21 VITALS — BP 120/76 | HR 80 | Temp 98.2°F | Ht 74.0 in | Wt 219.0 lb

## 2022-05-21 DIAGNOSIS — E669 Obesity, unspecified: Secondary | ICD-10-CM

## 2022-05-21 DIAGNOSIS — D509 Iron deficiency anemia, unspecified: Secondary | ICD-10-CM | POA: Insufficient documentation

## 2022-05-21 DIAGNOSIS — H543 Unqualified visual loss, both eyes: Secondary | ICD-10-CM | POA: Insufficient documentation

## 2022-05-21 DIAGNOSIS — H401131 Primary open-angle glaucoma, bilateral, mild stage: Secondary | ICD-10-CM | POA: Insufficient documentation

## 2022-05-21 DIAGNOSIS — H401120 Primary open-angle glaucoma, left eye, stage unspecified: Secondary | ICD-10-CM | POA: Insufficient documentation

## 2022-05-21 DIAGNOSIS — Z952 Presence of prosthetic heart valve: Secondary | ICD-10-CM

## 2022-05-21 DIAGNOSIS — J301 Allergic rhinitis due to pollen: Secondary | ICD-10-CM

## 2022-05-21 DIAGNOSIS — F411 Generalized anxiety disorder: Secondary | ICD-10-CM | POA: Insufficient documentation

## 2022-05-21 DIAGNOSIS — E1169 Type 2 diabetes mellitus with other specified complication: Secondary | ICD-10-CM | POA: Diagnosis not present

## 2022-05-21 DIAGNOSIS — E039 Hypothyroidism, unspecified: Secondary | ICD-10-CM

## 2022-05-21 DIAGNOSIS — C9210 Chronic myeloid leukemia, BCR/ABL-positive, not having achieved remission: Secondary | ICD-10-CM

## 2022-05-21 DIAGNOSIS — H04123 Dry eye syndrome of bilateral lacrimal glands: Secondary | ICD-10-CM | POA: Insufficient documentation

## 2022-05-21 MED ORDER — LORATADINE 10 MG PO TABS: ORAL_TABLET | ORAL | 3 refills | Status: DC

## 2022-05-21 NOTE — Progress Notes (Signed)
Subjective:  Patient ID: [name] White, male    DOB: 12-May-1935  Age: 87 y.o. MRN: [id number]  CC: Follow-up   HPI Xaidyn Wenner presents for CHF, HTN, DM  Regylar labs at the [facility] (pending in March)  Outpatient Medications Prior to Visit  Medication Sig Dispense Refill   acetaminophen (TYLENOL) 500 MG tablet Take 500 mg by mouth every 6 (six) hours as needed.     amLODipine (NORVASC) 10 MG tablet Take 10 mg by mouth daily.     apixaban (ELIQUIS) 2.5 MG TABS tablet Take 1 tablet (2.5 mg total) by mouth 2 (two) times daily. 180 tablet 3   Brinzolamide-Brimonidine (SIMBRINZA) 1-0.2 % SUSP Place 1 drop into both eyes in the morning, at noon, and at bedtime.     carboxymethylcellulose (REFRESH PLUS) 0.5 % SOLN Place 1 drop into both eyes in the morning, at noon, in the evening, and at bedtime.     cholecalciferol (VITAMIN D3) 25 MCG (1000 UNIT) tablet Take 1,000 Units by mouth at bedtime.     CINNAMON PO Take 2,000 mg by mouth 2 (two) times daily.      Coenzyme Q10 ([employer] COQ-10) 200 MG capsule Take 200 mg by mouth every evening.      empagliflozin (JARDIANCE) 25 MG TABS tablet Take 25 mg by mouth daily.     famotidine (PEPCID) 20 MG tablet Take 20 mg by mouth daily as needed for indigestion or heartburn.     ferrous sulfate 325 (65 FE) MG tablet Take 325 mg by mouth every evening.      finasteride (PROSCAR) 5 MG tablet Take 5 mg by mouth every evening.     ketotifen (ZADITOR) 0.025 % ophthalmic solution Place 1 drop into both eyes in the morning and at bedtime.     levothyroxine (SYNTHROID) 200 MCG tablet Take 200 mcg by mouth daily before breakfast.     MELATONIN PO Take 1 tablet by mouth at bedtime.     metFORMIN (GLUCOPHAGE) 500 MG tablet Take 500 mg by mouth daily with breakfast.     nilotinib (TASIGNA) 150 MG capsule Take 300 mg by mouth 2 (two) times daily. Take on an empty stomach, 1 hr before or 2 hrs after food.     oxyCODONE (OXY IR/ROXICODONE) 5 MG immediate release tablet  Take 1 tablet (5 mg total) by mouth every 6 (six) hours as needed for severe pain. 15 tablet 0   PARoxetine (PAXIL) 40 MG tablet Take 1 tablet (40 mg total) by mouth every morning. 90 tablet 1   potassium chloride SA (KLOR-CON M20) 20 MEQ tablet TAKE 1 TABLET BY MOUTH EVERY DAY 90 tablet 0   rosuvastatin (CRESTOR) 10 MG tablet Take 1 tablet (10 mg total) by mouth daily. Please schedule an appointment for further refills 30 tablet 11   spironolactone (ALDACTONE) 25 MG tablet Take 25 mg by mouth daily.     traZODone (DESYREL) 50 MG tablet Take 50 mg by mouth at bedtime. As needed     TURMERIC PO Take 1,000 mg by mouth every evening.      White Petrolatum-Mineral Oil (LUBRICANT EYE NIGHTTIME) OINT Place 1 application. into both eyes at bedtime.     loratadine (CLARITIN) 10 MG tablet TAKE 1 TABLET BY MOUTH EVERY DAY *NOT COVERED 90 tablet 3   amoxicillin (AMOXIL) 500 MG tablet Take 4 capsules (2,000 mg) one hour prior to all dental visits. (Patient not taking: Reported on 05/21/2022) 8 tablet 11  No facility-administered medications prior to visit.    ROS: Review of Systems  Constitutional:  Negative for appetite change, fatigue and unexpected weight change.  HENT:  Negative for congestion, nosebleeds, sneezing, sore throat and trouble swallowing.   Eyes:  Positive for visual disturbance. Negative for itching.  Respiratory:  Negative for cough.   Cardiovascular:  Positive for leg swelling. Negative for chest pain and palpitations.  Gastrointestinal:  Negative for abdominal distention, blood in stool, diarrhea and nausea.  Genitourinary:  Negative for frequency and hematuria.  Musculoskeletal:  Negative for back pain, gait problem, joint swelling and neck pain.  Skin:  Positive for wound. Negative for rash.  Neurological:  Negative for dizziness, tremors, speech difficulty and weakness.  Psychiatric/Behavioral:  Negative for agitation, dysphoric mood and sleep disturbance. The patient is not  nervous/anxious.     Objective:  BP 120/76 (BP Location: Right Arm, Patient Position: Sitting, Cuff Size: Normal)   Pulse 80   Temp 98.2 F (36.8 C) (Oral)   Ht 6' 2"$  (1.88 m)   Wt 219 lb (99.3 kg)   SpO2 98%   BMI 28.12 kg/m   BP Readings from Last 3 Encounters:  05/21/22 120/76  01/09/22 136/80  11/20/21 120/72    Wt Readings from Last 3 Encounters:  05/21/22 219 lb (99.3 kg)  01/09/22 213 lb 6.4 oz (96.8 kg)  11/20/21 221 lb (100.2 kg)    Physical Exam Constitutional:      General: He is not in acute distress.    Appearance: Normal appearance. He is well-developed.     Comments: NAD  Eyes:     Conjunctiva/sclera: Conjunctivae normal.     Pupils: Pupils are equal, round, and reactive to light.  Neck:     Thyroid: No thyromegaly.     Vascular: No JVD.  Cardiovascular:     Rate and Rhythm: Normal rate. Rhythm irregular.     Heart sounds: Normal heart sounds. No murmur heard.    No friction rub. No gallop.  Pulmonary:     Effort: Pulmonary effort is normal. No respiratory distress.     Breath sounds: Normal breath sounds. No wheezing or rales.  Chest:     Chest wall: No tenderness.  Abdominal:     General: Bowel sounds are normal. There is no distension.     Palpations: Abdomen is soft. There is no mass.     Tenderness: There is no abdominal tenderness. There is no guarding or rebound.  Musculoskeletal:        General: No tenderness. Normal range of motion.     Cervical back: Normal range of motion.     Right lower leg: Edema present.     Left lower leg: Edema present.  Lymphadenopathy:     Cervical: No cervical adenopathy.  Skin:    General: Skin is warm and dry.     Findings: No rash.  Neurological:     Mental Status: He is alert and oriented to person, place, and time.     Cranial Nerves: No cranial nerve deficit.     Motor: No abnormal muscle tone.     Coordination: Coordination abnormal.     Gait: Gait abnormal.     Deep Tendon Reflexes: Reflexes  are normal and symmetric.  Psychiatric:        Behavior: Behavior normal.        Thought Content: Thought content normal.        Judgment: Judgment normal.   Trace ankle edema  Lab Results  Component Value Date   WBC 5.1 01/09/2022   HGB 12.0 (L) 01/09/2022   HCT 38.2 (L) 01/09/2022   PLT 174 01/09/2022   GLUCOSE 102 (H) 01/09/2022   CHOL 171 01/22/2018   TRIG 98 01/22/2018   HDL 57 01/22/2018   LDLDIRECT 141.6 11/12/2010   LDLCALC 94 01/22/2018   ALT 22 07/14/2019   AST 40 (H) 07/14/2019   NA 137 01/09/2022   K 4.3 01/09/2022   CL 105 01/09/2022   CREATININE 0.94 01/09/2022   BUN 19 01/09/2022   CO2 23 01/09/2022   TSH 1.25 07/14/2019   PSA 1.42 09/13/2013   INR 1.1 04/09/2019   HGBA1C 7.7 (H) 08/06/2021   MICROALBUR 6.0 (H) 01/10/2017    No results found.  Assessment & Plan:   Problem List Items Addressed This Visit       Respiratory   Allergic rhinitis    On  Loratidine        Endocrine   Hypothyroidism (Chronic)    On levothyroxine      Diabetes mellitus type 2 in obese (HCC) - Primary (Chronic)    On Metformin and Jardiance per [facility] End[facility]in[facility].         Other   S/P TAVR (transcatheter aortic valve replacement)    Doing well      CML (chronic myelocytic leukemia) (HCC)    Doing well      Relevant Medications   loratadine (CLARITIN) 10 MG tablet      Meds ordered this encounter  Medications   loratadine (CLARITIN) 10 MG tablet    Sig: TAKE 1 TABLET BY MOUTH EVERY DAY    Dispense:  100 tablet    Refill:  3      Follow-up: Return in about 1 year (around 05/22/2023) for a follow-up visit.  Walker Kehr, MD

## 2022-05-21 NOTE — Assessment & Plan Note (Signed)
On Metformin and Jardiance per [facility] Endocrinology.

## 2022-05-21 NOTE — Assessment & Plan Note (Signed)
Doing well 

## 2022-05-21 NOTE — Assessment & Plan Note (Signed)
On levothyroxine  ?

## 2022-05-21 NOTE — Assessment & Plan Note (Signed)
On  Loratidine

## 2022-06-28 ENCOUNTER — Other Ambulatory Visit (HOSPITAL_COMMUNITY): Payer: Self-pay | Admitting: Internal Medicine

## 2022-09-22 ENCOUNTER — Other Ambulatory Visit (HOSPITAL_COMMUNITY): Payer: Self-pay | Admitting: Internal Medicine

## 2022-11-08 LAB — PROTEIN / CREATININE RATIO, URINE
Albumin, U: 4.4
Creatinine, Urine: 60.8

## 2022-11-08 LAB — HEMOGLOBIN A1C: Hemoglobin A1C: 8.1

## 2022-11-08 LAB — MICROALBUMIN / CREATININE URINE RATIO: Microalb Creat Ratio: 72.3

## 2022-12-23 ENCOUNTER — Other Ambulatory Visit (HOSPITAL_COMMUNITY): Payer: Self-pay | Admitting: Internal Medicine

## 2023-01-17 ENCOUNTER — Encounter: Payer: Self-pay | Admitting: Internal Medicine

## 2023-01-17 NOTE — Progress Notes (Signed)
  Pulled from [place] note on 11/08/2022    URINE CREATININE (ATELLICA) 60.8 mg/dL   78.2-956.2          URINE ALBUMIN (ATELLICA) 4.4 mg/dL   1.30-86.57        URINE ALBUMIN/CREAT RATIO (ATELLICA) 72.3        HGB A1C 8.1 H

## 2023-02-07 ENCOUNTER — Other Ambulatory Visit (HOSPITAL_COMMUNITY): Payer: Self-pay | Admitting: Cardiology

## 2023-02-07 DIAGNOSIS — I5032 Chronic diastolic (congestive) heart failure: Secondary | ICD-10-CM

## 2023-02-20 ENCOUNTER — Ambulatory Visit: Payer: Medicare Other

## 2023-02-20 VITALS — Ht 74.0 in | Wt 225.0 lb

## 2023-02-20 DIAGNOSIS — Z0000 Encounter for general adult medical examination without abnormal findings: Secondary | ICD-10-CM | POA: Diagnosis not present

## 2023-02-20 NOTE — Progress Notes (Cosign Needed Addendum)
Subjective:   [name] White is a 87 y.o. male who presents for Medicare Annual/Subsequent preventive examination.  Visit Complete: Virtual I connected with  [name] White on 02/20/23 by a audio enabled telemedicine application and verified that I am speaking with the correct person using two identifiers.  Patient Location: Home  Provider Location: Office/Clinic  I discussed the limitations of evaluation and management by telemedicine. The patient expressed understanding and agreed to proceed.  Vital Signs: Because this visit was a virtual/telehealth visit, some criteria may be missing or patient reported. Any vitals not documented were not able to be obtained and vitals that have been documented are patient reported.   Cardiac Risk Factors include: advanced age (>63men, >25 women);hypertension;diabetes mellitus;dyslipidemia;Other (see comment), Risk factor comments: A-Fib, CAD, OSA     Objective:    Today's Vitals   02/20/23 1326  Weight: 225 lb (102.1 kg)  Height: 6\' 2"  (1.88 m)   Body mass index is 28.89 kg/m.     02/20/2023    2:01 PM 02/18/2022    1:56 PM 08/16/2021    9:53 AM 08/06/2021   10:07 AM 02/16/2021    3:08 PM 02/13/2020   10:09 AM 12/22/2019   11:13 AM  Advanced Directives  Does Patient Have a Medical Advance Directive? Yes Yes Yes Yes Yes Yes Yes  Type of Estate agent of [place];Living will Healthcare Power of [place];Living will Healthcare Power of [place];Living will Healthcare Power of [place];Living will Living will;Healthcare Power of [employer] Power of [place];Living will Healthcare Power of Attorney  Does patient want to make changes to medical advance directive?   No - Patient declined  No - Patient declined  No - Patient declined  Copy of Healthcare Power of Attorney in Chart? No - copy requested No - copy requested No - copy requested  No - copy requested  No - copy requested    Current Medications  (verified) Outpatient Encounter Medications as of 02/20/2023  Medication Sig   acetaminophen (TYLENOL) 500 MG tablet Take 500 mg by mouth every 6 (six) hours as needed.   amLODipine (NORVASC) 10 MG tablet Take 10 mg by mouth daily.   apixaban (ELIQUIS) 5 MG TABS tablet Take 5 mg by mouth 2 (two) times daily.   Brinzolamide-Brimonidine (SIMBRINZA) 1-0.2 % SUSP Place 1 drop into both eyes in the morning, at noon, and at bedtime.   carboxymethylcellulose (REFRESH PLUS) 0.5 % SOLN Place 1 drop into both eyes in the morning, at noon, in the evening, and at bedtime.   cholecalciferol (VITAMIN D3) 25 MCG (1000 UNIT) tablet Take 1,000 Units by mouth at bedtime.   CINNAMON PO Take 2,000 mg by mouth 2 (two) times daily.    Coenzyme Q10 ([employer] COQ-10) 200 MG capsule Take 200 mg by mouth every evening.    empagliflozin (JARDIANCE) 25 MG TABS tablet Take 25 mg by mouth daily.   famotidine (PEPCID) 20 MG tablet Take 20 mg by mouth daily as needed for indigestion or heartburn.   ferrous sulfate 325 (65 FE) MG tablet Take 325 mg by mouth daily with breakfast. Patient taking on Mon, Wed, Fri   finasteride (PROSCAR) 5 MG tablet Take 5 mg by mouth every evening.   levothyroxine (SYNTHROID) 200 MCG tablet Take 200 mcg by mouth daily before breakfast.   loratadine (CLARITIN) 10 MG tablet TAKE 1 TABLET BY MOUTH EVERY DAY   MELATONIN PO Take 1 tablet by mouth at bedtime.   metFORMIN (  GLUCOPHAGE) 500 MG tablet Take 500 mg by mouth daily with breakfast.   mirabegron ER (MYRBETRIQ) 50 MG TB24 tablet Take 50 mg by mouth daily.   nilotinib (TASIGNA) 150 MG capsule Take 300 mg by mouth 2 (two) times daily. Take on an empty stomach, 1 hr before or 2 hrs after food.   oxyCODONE (OXY IR/ROXICODONE) 5 MG immediate release tablet Take 1 tablet (5 mg total) by mouth every 6 (six) hours as needed for severe pain.   PARoxetine (PAXIL) 40 MG tablet Take 1 tablet (40 mg total) by mouth every morning.   potassium chloride SA (KLOR-CON  M20) 20 MEQ tablet TAKE 1 TABLET BY MOUTH EVERY DAY   rosuvastatin (CRESTOR) 10 MG tablet TAKE 1 TABLET (10 MG TOTAL) BY MOUTH DAILY. PLEASE SCHEDULE AN APPOINTMENT FOR FURTHER REFILLS   spironolactone (ALDACTONE) 25 MG tablet Take 25 mg by mouth daily.   traZODone (DESYREL) 50 MG tablet Take 50 mg by mouth at bedtime. As needed   TURMERIC PO Take 1,000 mg by mouth every evening.    White Petrolatum-Mineral Oil (LUBRICANT EYE NIGHTTIME) OINT Place 1 application. into both eyes at bedtime.   amoxicillin (AMOXIL) 500 MG tablet Take 4 capsules (2,000 mg) one hour prior to all dental visits. (Patient not taking: Reported on 05/21/2022)   apixaban (ELIQUIS) 2.5 MG TABS tablet Take 1 tablet (2.5 mg total) by mouth 2 (two) times daily. (Patient not taking: Reported on 02/20/2023)   ketotifen (ZADITOR) 0.025 % ophthalmic solution Place 1 drop into both eyes in the morning and at bedtime.   No facility-administered encounter medications on file as of 02/20/2023.    Allergies (verified) Ace inhibitors, Benazepril, Codeine, Statins, Piroxicam, Pseudoephedrine, Brinzolamide-brimonidine, Dasatinib, and Betadine [povidone iodine]   History: Past Medical History:  Diagnosis Date   Anemia 06/04/2015   Anxiety    Atrial flutter (HCC)    s/p ablation 07/17/08   Cancer (HCC)    leukemia - dx 05/2018   Chronic diastolic CHF (congestive heart failure) (HCC)    Coronary artery disease    Depression    Diabetes mellitus type 2 in obese 04/10/2009   Qualifier: Diagnosis of  By: Lovell Sheehan MD, Balinda Quails, diet controlled, lost 60lbs   Emphysema of lung (HCC)    GERD (gastroesophageal reflux disease)    Glaucoma    recent dx - has an appt to be evaluated and meds to treat   H/O hiatal hernia    Hearing loss    some hearing loss in left ear but has not been dx, no hearing aids   Hepatitis    h/o of type A   History of measles as a child    History of mumps as a child    Hypertension    Hypothyroidism     Micturition syncope    Osteoarthritis    Pleural effusion, bilateral    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   26 mm Edwards Sapien 3 transcatheter heart valve placed via percutaneous right transfemoral approach    Severe aortic stenosis    Sleep apnea    uses CPAP nightly   Vitamin D deficiency 11/24/2015   Past Surgical History:  Procedure Laterality Date   CARDIAC CATHETERIZATION  03/18/2019   CARDIAC ELECTROPHYSIOLOGY MAPPING AND ABLATION  06/2008   CATARACT EXTRACTION W/ INTRAOCULAR LENS  IMPLANT, BILATERAL  ~ 2007   COLONOSCOPY     FOOT TENDON SURGERY Left    INTRAOPERATIVE TRANSTHORACIC ECHOCARDIOGRAM N/A 04/13/2019  Procedure: TRANSTHORACIC ECHOCARDIOGRAM;  Surgeon: Tonny Bollman, MD;  Location: [facility] OR;  Service: Open Heart Surgery;  Laterality: N/A;   RIGHT/LEFT HEART CATH AND CORONARY ANGIOGRAPHY N/A 03/18/2019   Procedure: RIGHT/LEFT HEART CATH AND CORONARY ANGIOGRAPHY;  Surgeon: Dolores Patty, MD;  Location: [facility] INVASIVE CV LAB;  Service: Cardiovascular;  Laterality: N/A;   TONSILLECTOMY  1940's   TOTAL KNEE ARTHROPLASTY  1990's   right   TOTAL KNEE ARTHROPLASTY Left 08/02/2013   Procedure: LEFT TOTAL KNEE ARTHROPLASTY;  Surgeon: Loanne Drilling, MD;  Location: [facility] ORS;  Service: Orthopedics;  Laterality: Left;   TRANSCATHETER AORTIC VALVE REPLACEMENT, TRANSFEMORAL N/A 04/13/2019   Procedure: TRANSCATHETER AORTIC VALVE REPLACEMENT, TRANSFEMORAL;  Surgeon: Tonny Bollman, MD;  Location: [facility];  Service: Open Heart Surgery;  Laterality: N/A;   UPPER GI ENDOSCOPY     WISDOM TOOTH EXTRACTION     Family History  Problem Relation Age of Onset   Multiple sclerosis Father    Heart disease Mother        atrial fibrillation   Hypertension Son    Heart disease Maternal Grandfather    Social History   Socioeconomic History   Marital status: Married    Spouse name: [name]   Number of children: 2   Years of education: Not on file   Highest  education level: Not on file  Occupational History   Occupation: Retired  Tobacco Use   Smoking status: Former    Types: Pipe, Cigars    Quit date: 04/01/1996    Years since quitting: 26.9   Smokeless tobacco: Former    Types: Chew    Quit date: 04/01/2009  Vaping Use   Vaping status: Never Used  Substance and Sexual Activity   Alcohol use: Not Currently    Alcohol/week: 0.0 standard drinks of alcohol    Comment: Quit wine early 2020.   Drug use: No   Sexual activity: Not Currently    Comment: lives with wife, eats clean diet. No major dietary restrtictions,   Other Topics Concern   Not on file  Social History Narrative   Live in home with wife.   Social Determinants of Health   Financial Resource Strain: Low Risk  (02/20/2023)   Overall Financial Resource Strain (CARDIA)    Difficulty of Paying Living Expenses: Not hard at all  Food Insecurity: No Food Insecurity (02/20/2023)   Hunger Vital Sign    Worried About Running Out of Food in the Last Year: Never true    Ran Out of Food in the Last Year: Never true  Transportation Needs: No Transportation Needs (02/20/2023)   PRAPARE - Administrator, Civil Service (Medical): No    Lack of Transportation (Non-Medical): No  Physical Activity: Insufficiently Active (02/20/2023)   Exercise Vital Sign    Days of Exercise per Week: 7 days    Minutes of Exercise per Session: 20 min  Stress: No Stress Concern Present (02/20/2023)   [employer] of Occupational Health - Occupational Stress Questionnaire    Feeling of Stress : Not at all  Social Connections: Moderately Isolated (02/20/2023)   Social Connection and Isolation Panel [NHANES]    Frequency of Communication with Friends and Family: Three times a week    Frequency of Social Gatherings with Friends and Family: Once a week    Attends Religious Services: Never    Database administrator or Organizations: No    Attends Banker Meetings: Never  Marital Status: Married    Tobacco Counseling Counseling given: Not Answered   Clinical Intake:  Pre-visit preparation completed: Yes  Pain : No/denies pain     BMI - recorded: 28.89 Nutritional Status: BMI 25 -29 Overweight Nutritional Risks: None Diabetes: Yes CBG done?: No Did pt. bring in CBG monitor from home?: No  How often do you need to have someone help you when you read instructions, pamphlets, or other written materials from your doctor or pharmacy?: 1 - Never  Interpreter Needed?: No  Information entered by :: Tangi Shroff, RMA   Activities of Daily Living    02/20/2023    1:27 PM  In your present state of health, do you have any difficulty performing the following activities:  Hearing? 0  Vision? 0  Difficulty concentrating or making decisions? 0  Walking or climbing stairs? 0  Dressing or bathing? 0  Doing errands, shopping? 0  Preparing Food and eating ? N  Using the Toilet? N  In the past six months, have you accidently leaked urine? Y  Do you have problems with loss of bowel control? N  Managing your Medications? N  Managing your Finances? N  Housekeeping or managing your Housekeeping? N    Patient Care Team: Plotnikov, Georgina Quint, MD as PCP - General (Internal Medicine) Bensimhon, Bevelyn Buckles, MD as PCP - Advanced Heart Failure (Cardiology) Mariel Craft, OD as Consulting Physician (Optometry) Bensimhon, Bevelyn Buckles, MD as Consulting Physician (Cardiology) Ollen Gross, MD as Consulting Physician (Orthopedic Surgery) Duffy Rhody, MD as Consulting Physician (Cardiology) Burton Apley, MD as Consulting Physician (Family Medicine) Carola Frost, DDS (Dentistry) Tonny Bollman, MD as Consulting Physician (Cardiology) Marinus Maw, MD as Consulting Physician (Cardiology)  Indicate any recent Medical Services you may have received from other than [facility] providers in the past year (date may be approximate).     Assessment:   This is a  routine wellness examination for [name] White.  Hearing/Vision screen Hearing Screening - Comments:: Denies hearing difficulties   Vision Screening - Comments:: Wears eyeglasses   Goals Addressed             This Visit's Progress    To maintain my current health status by continuing to eat healthy, stay physically active and socially active.   On track     Depression Screen    02/20/2023    2:07 PM 05/21/2022   10:09 AM 02/18/2022    1:50 PM 05/21/2021   10:54 AM 02/16/2021    3:10 PM 05/15/2020    2:35 PM 12/22/2019   11:12 AM  PHQ 2/9 Scores  PHQ - 2 Score 0 0 0 0 0 0 0  PHQ- 9 Score 2 0    1     Fall Risk    02/20/2023    2:01 PM 05/21/2022   10:08 AM 02/18/2022    1:56 PM 05/21/2021   10:54 AM 02/16/2021    3:08 PM  Fall Risk   Falls in the past year? 0 0 0 1 0  Number falls in past yr: 0 0 0 1 0  Injury with Fall? 0 0 0  0  Risk for fall due to : No Fall Risks No Fall Risks No Fall Risks  No Fall Risks  Follow up Falls prevention discussed;Falls evaluation completed Falls evaluation completed Falls prevention discussed  Falls evaluation completed    MEDICARE RISK AT HOME: Medicare Risk at Home Any stairs in or around the home?: No Home free of  loose throw rugs in walkways, pet beds, electrical cords, etc?: Yes Adequate lighting in your home to reduce risk of falls?: Yes Life alert?: No Use of a cane, walker or w/c?: Yes Grab bars in the bathroom?: Yes Shower chair or bench in shower?: Yes Elevated toilet seat or a handicapped toilet?: Yes  TIMED UP AND GO:  Was the test performed?  No    Cognitive Function:    07/29/2016    9:05 AM  MMSE - Mini Mental State Exam  Orientation to time 5  Orientation to Place 5  Registration 3  Attention/ Calculation 4  Recall 3  Language- name 2 objects 2  Language- repeat 1  Language- follow 3 step command 3  Language- read & follow direction 1  Write a sentence 1  Copy design 1  Total score 29         02/20/2023    2:03 PM 12/22/2019   11:13 AM  6CIT Screen  What Year? 0 points 0 points  What month? 0 points 0 points  What time? 0 points 0 points  Count back from 20 0 points 0 points  Months in reverse 4 points 0 points  Repeat phrase 0 points 0 points  Total Score 4 points 0 points    Immunizations Immunization History  Administered Date(s) Administered   Fluad Quad(high Dose 65+) 01/20/2020, 01/31/2020, 03/15/2021   Influenza Split 12/31/2011   Influenza Whole 01/11/2009, 02/08/2010   Influenza, High Dose Seasonal PF 12/05/2010, 12/31/2011, 01/12/2013, 11/24/2015, 02/12/2016, 01/10/2017, 12/30/2017, 12/17/2018   Influenza,inj,Quad PF,6+ Mos 02/01/2014, 12/15/2014   Influenza-Unspecified 12/30/2001, 12/31/2002, 12/31/2003, 01/30/2005, 02/08/2005, 01/20/2006, 12/18/2006, 01/31/2008, 11/30/2008, 12/18/2017, 12/19/2018, 01/01/2021, 01/18/2022   Moderna Sars-Covid-2 Vaccination 05/04/2019, 06/02/2019, 01/20/2020   Pneumococcal Conjugate-13 11/24/2015, 10/10/2016   Pneumococcal Polysaccharide-23 10/29/2008   Pneumococcal-Unspecified 12/23/2003   Td 11/15/2002, 04/30/2008   Td (Adult),unspecified 11/15/2002   Tdap 09/25/2011    TDAP status: Due, Education has been provided regarding the importance of this vaccine. Advised may receive this vaccine at local pharmacy or Health Dept. Aware to provide a copy of the vaccination record if obtained from local pharmacy or Health Dept. Verbalized acceptance and understanding.  Flu Vaccine status: Due, Education has been provided regarding the importance of this vaccine. Advised may receive this vaccine at local pharmacy or Health Dept. Aware to provide a copy of the vaccination record if obtained from local pharmacy or Health Dept. Verbalized acceptance and understanding.  Pneumococcal vaccine status: Up to date  Covid-19 vaccine status: Information provided on how to obtain vaccines.   Qualifies for Shingles Vaccine? Yes   Zostavax  completed  Declines   Shingrix Completed?: No.    Education has been provided regarding the importance of this vaccine. Patient has been advised to call insurance company to determine out of pocket expense if they have not yet received this vaccine. Advised may also receive vaccine at local pharmacy or Health Dept. Verbalized acceptance and understanding.  Screening Tests Health Maintenance  Topic Date Due   COVID-19 Vaccine (4 - 2023-24 season) 03/14/2023 (Originally 12/01/2022)   DTaP/Tdap/Td (5 - Td or Tdap) 04/01/2023 (Originally 09/24/2021)   OPHTHALMOLOGY EXAM  04/01/2023 (Originally 11/20/2018)   Zoster Vaccines- Shingrix (1 of 2) 05/23/2023 (Originally 06/08/1954)   Diabetic kidney evaluation - eGFR measurement  05/30/2023 (Originally 01/10/2023)   FOOT EXAM  05/30/2023 (Originally 07/29/2017)   INFLUENZA VACCINE  06/30/2023 (Originally 10/31/2022)   HEMOGLOBIN A1C  05/11/2023   Diabetic kidney evaluation - Urine ACR  11/08/2023  Medicare Annual Wellness (AWV)  02/20/2024   Pneumonia Vaccine 67+ Years old  Completed   HPV VACCINES  Aged Out    Health Maintenance  There are no preventive care reminders to display for this patient.   Colorectal cancer screening: No longer required.   Lung Cancer Screening: (Low Dose CT Chest recommended if Age 92-80 years, 20 pack-year currently smoking OR have quit w/in 15years.) does not qualify.   Lung Cancer Screening Referral: N/A  Additional Screening:  Hepatitis C Screening: does not qualify;  Vision Screening: Recommended annual ophthalmology exams for early detection of glaucoma and other disorders of the eye. Is the patient up to date with their annual eye exam?  Yes  Who is the provider or what is the name of the office in which the patient attends annual eye exams? [facility] If pt is not established with a provider, would they like to be referred to a provider to establish care? No .   Dental Screening: Recommended annual dental  exams for proper oral hygiene  Diabetic Foot Exam: Diabetic Foot Exam: Overdue, Pt has been advised about the importance in completing this exam. Pt is scheduled for diabetic foot exam on 05/26/2023.  Community Resource Referral / Chronic Care Management: CRR required this visit?  No   CCM required this visit?  No     Plan:     I have personally reviewed and noted the following in the patient's chart:   Medical and social history Use of alcohol, tobacco or illicit drugs  Current medications and supplements including opioid prescriptions. Patient is not currently taking opioid prescriptions. Functional ability and status Nutritional status Physical activity Advanced directives List of other physicians Hospitalizations, surgeries, and ER visits in previous 12 months Vitals Screenings to include cognitive, depression, and falls Referrals and appointments  In addition, I have reviewed and discussed with patient certain preventive protocols, quality metrics, and best practice recommendations. A written personalized care plan for preventive services as well as general preventive health recommendations were provided to patient.     [name] White L Merlean Pizzini, CMA   02/20/2023   After Visit Summary: (MyChart) Due to this being a telephonic visit, the after visit summary with patients personalized plan was offered to patient via MyChart   Nurse Notes: Patient is dur for a foot exam, an eye exam, [facility]  He is also due for a Flu, Tetanus and Covid vaccine, which he will receive during his up coming visit at the [facility] hospital.  I have requested record for the eye exam.  Patient is due for a kidney evaluation as well.  He had no other concerns to address today.  Patient will wait to schedule his next year's AWV.  Medical screening examination/treatment/procedure(s) were performed by non-physician practitioner and as supervising  physician I was immediately available for consultation/collaboration.  I agree with above. Jacinta Shoe, MD

## 2023-02-20 NOTE — Patient Instructions (Addendum)
[name] , Thank you for taking time to come for your Medicare Wellness Visit. I appreciate your ongoing commitment to your health goals. Please review the following plan we discussed and let me know if I can assist you in the future.   Referrals/Orders/Follow-Ups/Clinician Recommendations: You are due for a Flu vaccine, a Tetanus vaccine and a Covid vaccine.  Keep up the good work.    This is a list of the screening recommended for you and due dates:  Health Maintenance  Topic Date Due   Complete foot exam   07/29/2017   Eye exam for diabetics  11/20/2018   Yearly kidney function blood test for diabetes  01/10/2023   COVID-19 Vaccine (4 - 2023-24 season) 03/14/2023*   DTaP/Tdap/Td vaccine (5 - Td or Tdap) 04/01/2023*   Zoster (Shingles) Vaccine (1 of 2) 05/23/2023*   Flu Shot  06/30/2023*   Hemoglobin A1C  05/11/2023   Yearly kidney health urinalysis for diabetes  11/08/2023   Medicare Annual Wellness Visit  02/20/2024   Pneumonia Vaccine  Completed   HPV Vaccine  Aged Out  *Topic was postponed. The date shown is not the original due date.    Advanced directives: (Copy Requested) Please bring a copy of your health care power of attorney and living will to the office to be added to your chart at your convenience.  Next Medicare Annual Wellness Visit scheduled for next year: No

## 2023-03-04 DIAGNOSIS — L570 Actinic keratosis: Secondary | ICD-10-CM | POA: Diagnosis not present

## 2023-03-04 DIAGNOSIS — Z85828 Personal history of other malignant neoplasm of skin: Secondary | ICD-10-CM | POA: Diagnosis not present

## 2023-03-04 DIAGNOSIS — D225 Melanocytic nevi of trunk: Secondary | ICD-10-CM | POA: Diagnosis not present

## 2023-03-04 DIAGNOSIS — Z4802 Encounter for removal of sutures: Secondary | ICD-10-CM | POA: Diagnosis not present

## 2023-03-04 DIAGNOSIS — L814 Other melanin hyperpigmentation: Secondary | ICD-10-CM | POA: Diagnosis not present

## 2023-03-04 DIAGNOSIS — L538 Other specified erythematous conditions: Secondary | ICD-10-CM | POA: Diagnosis not present

## 2023-03-04 DIAGNOSIS — L821 Other seborrheic keratosis: Secondary | ICD-10-CM | POA: Diagnosis not present

## 2023-03-04 DIAGNOSIS — L820 Inflamed seborrheic keratosis: Secondary | ICD-10-CM | POA: Diagnosis not present

## 2023-03-04 DIAGNOSIS — L02821 Furuncle of head [any part, except face]: Secondary | ICD-10-CM | POA: Diagnosis not present

## 2023-03-04 DIAGNOSIS — Z08 Encounter for follow-up examination after completed treatment for malignant neoplasm: Secondary | ICD-10-CM | POA: Diagnosis not present

## 2023-03-20 ENCOUNTER — Other Ambulatory Visit (HOSPITAL_COMMUNITY): Payer: Self-pay | Admitting: Internal Medicine

## 2023-03-25 ENCOUNTER — Other Ambulatory Visit (HOSPITAL_COMMUNITY): Payer: Self-pay | Admitting: Cardiology

## 2023-03-25 ENCOUNTER — Ambulatory Visit (HOSPITAL_COMMUNITY)
Admission: RE | Admit: 2023-03-25 | Discharge: 2023-03-25 | Disposition: A | Discharge status: Home / Self Care | Payer: Medicare Other | Source: Ambulatory Visit | Attending: Cardiology | Admitting: Cardiology

## 2023-03-25 VITALS — BP 108/64 | HR 84 | Wt 237.6 lb

## 2023-03-25 DIAGNOSIS — I130 Hypertensive heart and chronic kidney disease with heart failure and stage 1 through stage 4 chronic kidney disease, or unspecified chronic kidney disease: Secondary | ICD-10-CM | POA: Diagnosis not present

## 2023-03-25 DIAGNOSIS — I5032 Chronic diastolic (congestive) heart failure: Secondary | ICD-10-CM | POA: Diagnosis not present

## 2023-03-25 DIAGNOSIS — I4821 Permanent atrial fibrillation: Secondary | ICD-10-CM | POA: Insufficient documentation

## 2023-03-25 DIAGNOSIS — Z7901 Long term (current) use of anticoagulants: Secondary | ICD-10-CM | POA: Insufficient documentation

## 2023-03-25 DIAGNOSIS — Z79899 Other long term (current) drug therapy: Secondary | ICD-10-CM | POA: Diagnosis not present

## 2023-03-25 DIAGNOSIS — I4892 Unspecified atrial flutter: Secondary | ICD-10-CM | POA: Diagnosis not present

## 2023-03-25 DIAGNOSIS — Z952 Presence of prosthetic heart valve: Secondary | ICD-10-CM | POA: Diagnosis not present

## 2023-03-25 DIAGNOSIS — N1832 Chronic kidney disease, stage 3b: Secondary | ICD-10-CM | POA: Insufficient documentation

## 2023-03-25 DIAGNOSIS — E785 Hyperlipidemia, unspecified: Secondary | ICD-10-CM | POA: Insufficient documentation

## 2023-03-25 DIAGNOSIS — E1122 Type 2 diabetes mellitus with diabetic chronic kidney disease: Secondary | ICD-10-CM | POA: Insufficient documentation

## 2023-03-25 DIAGNOSIS — R059 Cough, unspecified: Secondary | ICD-10-CM | POA: Insufficient documentation

## 2023-03-25 DIAGNOSIS — J439 Emphysema, unspecified: Secondary | ICD-10-CM | POA: Diagnosis not present

## 2023-03-25 DIAGNOSIS — I6523 Occlusion and stenosis of bilateral carotid arteries: Secondary | ICD-10-CM | POA: Diagnosis not present

## 2023-03-25 DIAGNOSIS — I2510 Atherosclerotic heart disease of native coronary artery without angina pectoris: Secondary | ICD-10-CM | POA: Insufficient documentation

## 2023-03-25 DIAGNOSIS — R42 Dizziness and giddiness: Secondary | ICD-10-CM | POA: Insufficient documentation

## 2023-03-25 DIAGNOSIS — Z7984 Long term (current) use of oral hypoglycemic drugs: Secondary | ICD-10-CM | POA: Insufficient documentation

## 2023-03-25 DIAGNOSIS — I083 Combined rheumatic disorders of mitral, aortic and tricuspid valves: Secondary | ICD-10-CM | POA: Insufficient documentation

## 2023-03-25 LAB — ECHOCARDIOGRAM COMPLETE
AR max vel: 1.11 cm2
AV Area VTI: 1.13 cm2
AV Area mean vel: 1.08 cm2
AV Mean grad: 9.5 mm[Hg]
AV Peak grad: 17.1 mm[Hg]
Ao pk vel: 2.07 m/s
Area-P 1/2: 2.87 cm2
Calc EF: 59.9 %
MV M vel: 5.53 m/s
MV Peak grad: 122.3 mm[Hg]
MV VTI: 1.19 cm2
Radius: 1.5 cm
S' Lateral: 3.4 cm
Single Plane A2C EF: 54.9 %
Single Plane A4C EF: 64 %

## 2023-03-25 LAB — BASIC METABOLIC PANEL
Anion gap: 9 (ref 5–15)
BUN: 22 mg/dL (ref 8–23)
CO2: 21 mmol/L — ABNORMAL LOW (ref 22–32)
Calcium: 9.4 mg/dL (ref 8.9–10.3)
Chloride: 107 mmol/L (ref 98–111)
Creatinine, Ser: 1.2 mg/dL (ref 0.61–1.24)
GFR, Estimated: 59 mL/min — ABNORMAL LOW (ref >60)
Glucose, Bld: 141 mg/dL — ABNORMAL HIGH (ref 70–99)
Potassium: 4.8 mmol/L (ref 3.5–5.1)
Sodium: 137 mmol/L (ref 135–145)

## 2023-03-25 LAB — CBC
HCT: 36.7 % — ABNORMAL LOW (ref 39.0–52.0)
Hemoglobin: 11.4 g/dL — ABNORMAL LOW (ref 13.0–17.0)
MCH: 26 pg (ref 26.0–34.0)
MCHC: 31.1 g/dL (ref 30.0–36.0)
MCV: 83.8 fL (ref 80.0–100.0)
Platelets: 247 10*3/uL (ref 150–400)
RBC: 4.38 MIL/uL (ref 4.22–5.81)
RDW: 17 % — ABNORMAL HIGH (ref 11.5–15.5)
WBC: 8.7 10*3/uL (ref 4.0–10.5)
nRBC: 0 % (ref 0.0–0.2)

## 2023-03-25 MED ORDER — FUROSEMIDE 20 MG PO TABS: 40.0000 mg | ORAL_TABLET | Freq: Every day | ORAL | 3 refills | Status: DC

## 2023-03-25 NOTE — Progress Notes (Signed)
ADVANCED HEART FAILURE FOLLOW UP CLINIC NOTE  Referring Physician: Tresa Garter, MD  Primary Care: Tresa Garter, MD Primary Cardiologist:  HPI: [name] White is a 87 y.o. male with a past medical history of nonobstructive CAD, hypertension, hyperlipidemia, obesity, sleep apnea, severe aortic stenosis s/p TAVR 04/2019, atrial flutter s/p ablation who presents for follow up of chronic diastolic heart failure.      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 Being treated at [facility]. Has lost over 40 pounds. BP down so clonidine stopped.    Echo 10/20 EF 55-60% RV ok. Severe AS mean gradient Small effusion. Mild to mod MR/TR. S/p TAVR on 04/13/19.  Echo 05/17/19 EF 60-65%. TAVR ok. RV ok. Mod TR   Echo 01/17/21: EF 60-65% TAVR stable. Severe biatrial enlargement.      SUBJECTIVE:  Patient reports that he has gained about 15 to 20 pounds since his last visit.  He does not recall being short of breath, but his movement is generally limited by dizziness upon standing.  He does state that he has had a nonproductive cough for the last few months and he does feel a bit more swollen.  PMH, current medications, allergies, social history, and family history reviewed in epic.  PHYSICAL EXAM: Vitals:   03/25/23 1010  BP: 108/64  Pulse: 84  SpO2: 97%   GENERAL: Well nourished and in no apparent distress at rest.  HEENT: The mucous membranes are pink and moist.   PULM:  Normal work of breathing, clear to auscultation bilaterally. Respirations are unlabored.  CARDIAC:  JVP: Moderately elevated        Irregular rate and rhythm.  Blowing systolic murmur heard over the apex, soft systolic murmur heard in the right upper sternal border, 1+ lower extremity edema ABDOMEN: Soft, non-tender, non-distended. NEUROLOGIC: Patient is oriented x3 with no focal or lateralizing neurologic deficits.  PSYCH: Patients affect is appropriate, there is no evidence of anxiety or depression.  SKIN:  Warm and dry; no lesions or wounds. Warm and well perfused extremities.  DATA REVIEW   ECHO: Echo 10/20 EF 55-60% RV ok. Severe AS mean gradient Small effusion. Mild to mod MR/TR. S/p TAVR on 04/13/19.  Echo 05/17/19 EF 60-65%. TAVR ok. RV ok. Mod TR   Echo 01/17/21: EF 60-65% TAVR stable. Severe biatrial enlargement.   03/2023: Normal ejection fraction, appropriately performing bioprosthetic aortic valve, worsening pulmonary artery pressures and likely new severe mitral regurgitation, eccentric jet that is not clearly seen on previous echocardiogram  CATH: R/L cath 12/20 Prox RCA to Mid RCA lesion is 30% stenosed. RPAV lesion is 30% stenosed. Ost Cx to Prox Cx lesion is 50% stenosed. Prox LAD to Mid LAD lesion is 20% stenosed  RA: 5, PA: 37/13 (22): PCWP 14, Fick cardiac output 9.8, index 4.3 but no evidence of shunt ASSESSMENT & PLAN:  Mitral regurgitation: New, likely severe mitral regurgitation noted on most recent echo.  Coupled with new pleural effusions as well as 20 pound weight gain and increased pulmonary artery pressures is likely significant.  Will plan for TEE for better characterization. -TEE -Lasix 40mg  daily -Structural consult pending results of TEE  Chronic diastolic heart failure: Preserved ejection fraction likely in the setting of new severe MR.  Only mild to moderately symptomatic, though orthostatic symptoms make staging his heart failure complex.  Does have a history of bicep tendon rupture, after TEE could consider amyloid workup. -Continue Jardiance 25 mg daily -Start Lasix as above -  repeat labs at next clinic visit -Consider amyloid workup at that time  Severe AS: TAVR 1/21.  Well-functioning valve on echocardiogram, will need SBE prophylaxis. -Yearly echoes for monitoring  Hypertension: BP well-controlled today.  CAD - No current s/s ischemia.  - cath 12/20 with stable non-obstructive CAD - Continue Crestor Not on ASA due to Eliquis. Not on BB  with bradycardia   CML - Following with Dr. Gabriel Earing at [facility] - On nilotinib   Atrial fibrillation, permanent - Tikosyn stopped 5/17 due to persistent AF. - Remains in AF today. Rate ok.  - Continue Eliquis 2.5 bid , increase to 5 BID last Cr 1.05   Carotid Stenosis - Carotid duplex 12/2013; stable 0-39% bilateral ICA stenosis - Continue Crestor - F/u as needed   DM2 - Continue Jardiance 25mg  daily   CKD 3b -Creatinine stable in clinic today  Clearnce Hasten, MD Advanced Heart Failure Mechanical Circulatory Support 03/27/23

## 2023-03-25 NOTE — Patient Instructions (Signed)
Medication Changes:  START: LASIX (FUROSEMIDE) 40MG  DAILY   Lab Work:  Labs done today, your results will be available in MyChart, we will contact you for abnormal readings.  Testing/Procedures:  Your physician has requested that you have a TEE. During a TEE, sound waves are used to create images of your heart. It provides your doctor with information about the size and shape of your heart and how well your heart's chambers and valves are working. In this test, a transducer is attached to the end of a flexible tube that's guided down your throat and into your esophagus (the tube leading from you mouth to your stomach) to get a more detailed image of your heart. You are not awake for the procedure. Please see the instruction sheet given to you today. For further information please visit [email or web address]  Special Instructions // Education:  CALL us ON THURSDAY 12/26 TO GET SCHEDULED FOR PROCEDURE [phone] OPT 2  Nothing to eat or drink after midnight except a sip of water with medications (see medication instructions below)   HOLD:  DO NOT TAKE JARDIANCE 3 DAYS PRIOR TO PROCEDURE   DO NO TAKE MEFORMIN THE MORNING OF PROCEDURE   DO NOT TAKE FUROSEMIDE THE MORNING OF PROCEDURE   DO NOT TAKE SPIRONOLACTONE THE MORNING OF PROCEDURE   LABS: TODAY   FYI:  For your safety, and to allow [place] to monitor your vital signs accurately during the surgery/procedure we request: If you have artificial nails, gel coating, SNS etc, please have those removed prior to your surgery/procedure. Not having the nail coverings /polish removed may result in cancellation or delay of your surgery/procedure.  Your support person will be asked to wait in the waiting room during your procedure.  It is OK to have someone drop you off and come back when you are ready to be discharged.  You cannot drive after the procedure and will need someone to drive you home.  Bring your insurance cards.  *Special Note:  Every effort is made to have your procedure done on time. Occasionally there are emergencies that occur at the hospital that may cause delays. Please be patient if a delay does occur.      Follow-Up in: 6 WEEKS AS SCHEDULED   At the Advanced [facility] Failure Clinic, you and your health needs are our priority. We have a designated team specialized in the treatment of Heart Failure. This Care Team includes your primary Heart Failure Specialized Cardiologist (physician), Advanced Practice Providers (APPs- Physician Assistants and Nurse Practitioners), and Pharmacist who all work together to provide you with the care you need, when you need it.   You may see any of the following providers on your designated Care Team at your next follow up:  Dr. Arvilla Meres Dr. Marca Ancona Dr. Dorthula Nettles Dr. Theresia Bough Tonye Becket, NP Robbie Lis, [place] [facility] [place], [place] Brynda Peon, NP [place] Lee, NP Karle Plumber, PharmD   Please be sure to bring in all your medications bottles to every appointment.   Need to Contact us:  If you have any questions or concerns before your next appointment please send [place] a message through [place] or call our office at [phone].    TO LEAVE A MESSAGE FOR THE NURSE SELECT OPTION 2, PLEASE LEAVE A MESSAGE INCLUDING: YOUR NAME DATE OF BIRTH CALL BACK NUMBER REASON FOR CALL**this is important as we prioritize the call backs  YOU WILL RECEIVE A CALL BACK THE SAME DAY AS LONG AS  YOU CALL BEFORE 4:00 PM

## 2023-03-27 ENCOUNTER — Telehealth (HOSPITAL_COMMUNITY): Payer: Self-pay | Admitting: Cardiology

## 2023-03-27 ENCOUNTER — Encounter (HOSPITAL_COMMUNITY): Payer: Self-pay | Admitting: Cardiology

## 2023-03-27 NOTE — Telephone Encounter (Signed)
Attempting to contact patient  Pt aware of procedure

## 2023-03-27 NOTE — Telephone Encounter (Addendum)
Pt called to report he is ready to schedule TEE       Dear [name]  You are scheduled for a TEE (Transesophageal Echocardiogram) on Monday, January 6 with Dr. Elwyn Lade.  Please arrive at the [facility] (Main Entrance A) at [facility]: [address] [place], [place] [address] at 830am (This time is 1 hour(s) before your procedure to ensure your preparation).   Free valet parking service is available. You will check in at ADMITTING.   *Please Note: You will receive a call the day before your procedure to confirm the appointment time. That time may have changed from the original time based on the schedule for that day.*   DIET:  Nothing to eat or drink after midnight except a sip of water with medications (see medication instructions below)  MEDICATION INSTRUCTIONS: !!IF ANY NEW MEDICATIONS ARE STARTED AFTER TODAY, PLEASE NOTIFY YOUR PROVIDER AS SOON AS POSSIBLE!!  FYI: Medications such as Semaglutide (Ozempic, [place]), Tirzepatide (Mounjaro, Zepbound), Dulaglutide (Trulicity), etc ("GLP1 agonists") AND Canagliflozin (Invokana), Dapagliflozin (Farxiga), Empagliflozin (Jardiance), Ertugliflozin (Steglatro), Bexagliflozin [employer]) or any combination with one of these drugs such as Invokamet (Canagliflozin/Metformin), Synjardy (Empagliflozin/Metformin), etc ("SGLT2 inhibitors") must be held around the time of a procedure. This is not a comprehensive list of all of these drugs. Please review all of your medications and talk to your provider if you take any one of these. If you are not sure, ask your provider.   HOLD: Empagliflozin (Jardiance) for 3 days prior to the procedure. Last dose on Thursday, January 02. HOLD METFORMIN,LASIX, and SPIRONOLACTONE day of procedure 04/07/23  Continue taking your anticoagulant (blood thinner): Apixaban (Eliquis).  You will need to continue this after your procedure until you are told by your provider that it is safe to stop.    LABS:  Pre  procedure labs done 03/25/23  FYI:  For your safety, and to allow [place] to monitor your vital signs accurately during the surgery/procedure we request: If you have artificial nails, gel coating, SNS etc, please have those removed prior to your surgery/procedure. Not having the nail coverings /polish removed may result in cancellation or delay of your surgery/procedure.  Your support person will be asked to wait in the waiting room during your procedure.  It is OK to have someone drop you off and come back when you are ready to be discharged.  You cannot drive after the procedure and will need someone to drive you home.  Bring your insurance cards.  *Special Note: Every effort is made to have your procedure done on time. Occasionally there are emergencies that occur at the hospital that may cause delays. Please be patient if a delay does occur.

## 2023-04-04 NOTE — Progress Notes (Signed)
 Spoke to pt and instructed them to come at 0730 and to be NPO after 0000.  Confirmed no missed doses of AC and instructed to take in AM with a small sip of water.  Confirmed that pt will have a ride home and someone to stay with them for 24 hours after the procedure. Instructed patient to not wear any jewelry or lotion.

## 2023-04-07 ENCOUNTER — Encounter (HOSPITAL_COMMUNITY)
Admission: RE | Disposition: A | Discharge status: Home / Self Care | Payer: Self-pay | Source: Home / Self Care | Attending: Cardiology

## 2023-04-07 ENCOUNTER — Encounter (HOSPITAL_COMMUNITY): Payer: Self-pay | Admitting: Cardiology

## 2023-04-07 ENCOUNTER — Ambulatory Visit (HOSPITAL_COMMUNITY)
Admission: RE | Admit: 2023-04-07 | Discharge: 2023-04-07 | Disposition: A | Discharge status: Home / Self Care | Payer: Medicare Other | Attending: Cardiology | Admitting: Cardiology

## 2023-04-07 ENCOUNTER — Other Ambulatory Visit: Payer: Self-pay

## 2023-04-07 ENCOUNTER — Ambulatory Visit (HOSPITAL_COMMUNITY)
Admission: RE | Admit: 2023-04-07 | Discharge: 2023-04-07 | Disposition: A | Discharge status: Home / Self Care | Payer: Medicare Other | Source: Ambulatory Visit | Attending: Cardiology | Admitting: Cardiology

## 2023-04-07 ENCOUNTER — Ambulatory Visit (HOSPITAL_COMMUNITY): Payer: Medicare Other | Admitting: Anesthesiology

## 2023-04-07 DIAGNOSIS — N1832 Chronic kidney disease, stage 3b: Secondary | ICD-10-CM | POA: Insufficient documentation

## 2023-04-07 DIAGNOSIS — Z87891 Personal history of nicotine dependence: Secondary | ICD-10-CM | POA: Diagnosis not present

## 2023-04-07 DIAGNOSIS — I509 Heart failure, unspecified: Secondary | ICD-10-CM

## 2023-04-07 DIAGNOSIS — I110 Hypertensive heart disease with heart failure: Secondary | ICD-10-CM | POA: Diagnosis not present

## 2023-04-07 DIAGNOSIS — I4891 Unspecified atrial fibrillation: Secondary | ICD-10-CM

## 2023-04-07 DIAGNOSIS — I6523 Occlusion and stenosis of bilateral carotid arteries: Secondary | ICD-10-CM | POA: Diagnosis not present

## 2023-04-07 DIAGNOSIS — E785 Hyperlipidemia, unspecified: Secondary | ICD-10-CM | POA: Insufficient documentation

## 2023-04-07 DIAGNOSIS — J449 Chronic obstructive pulmonary disease, unspecified: Secondary | ICD-10-CM | POA: Diagnosis not present

## 2023-04-07 DIAGNOSIS — I081 Rheumatic disorders of both mitral and tricuspid valves: Secondary | ICD-10-CM | POA: Diagnosis not present

## 2023-04-07 DIAGNOSIS — E669 Obesity, unspecified: Secondary | ICD-10-CM | POA: Diagnosis not present

## 2023-04-07 DIAGNOSIS — I5033 Acute on chronic diastolic (congestive) heart failure: Secondary | ICD-10-CM | POA: Diagnosis not present

## 2023-04-07 DIAGNOSIS — Z7901 Long term (current) use of anticoagulants: Secondary | ICD-10-CM | POA: Insufficient documentation

## 2023-04-07 DIAGNOSIS — E1122 Type 2 diabetes mellitus with diabetic chronic kidney disease: Secondary | ICD-10-CM | POA: Diagnosis not present

## 2023-04-07 DIAGNOSIS — G4730 Sleep apnea, unspecified: Secondary | ICD-10-CM | POA: Insufficient documentation

## 2023-04-07 DIAGNOSIS — E039 Hypothyroidism, unspecified: Secondary | ICD-10-CM | POA: Diagnosis not present

## 2023-04-07 DIAGNOSIS — I5032 Chronic diastolic (congestive) heart failure: Secondary | ICD-10-CM | POA: Insufficient documentation

## 2023-04-07 DIAGNOSIS — I340 Nonrheumatic mitral (valve) insufficiency: Secondary | ICD-10-CM

## 2023-04-07 DIAGNOSIS — I4821 Permanent atrial fibrillation: Secondary | ICD-10-CM | POA: Insufficient documentation

## 2023-04-07 DIAGNOSIS — Z6830 Body mass index (BMI) 30.0-30.9, adult: Secondary | ICD-10-CM | POA: Diagnosis not present

## 2023-04-07 DIAGNOSIS — I2510 Atherosclerotic heart disease of native coronary artery without angina pectoris: Secondary | ICD-10-CM | POA: Insufficient documentation

## 2023-04-07 DIAGNOSIS — Z7984 Long term (current) use of oral hypoglycemic drugs: Secondary | ICD-10-CM | POA: Diagnosis not present

## 2023-04-07 DIAGNOSIS — K219 Gastro-esophageal reflux disease without esophagitis: Secondary | ICD-10-CM | POA: Diagnosis not present

## 2023-04-07 DIAGNOSIS — Z952 Presence of prosthetic heart valve: Secondary | ICD-10-CM | POA: Diagnosis not present

## 2023-04-07 DIAGNOSIS — I130 Hypertensive heart and chronic kidney disease with heart failure and stage 1 through stage 4 chronic kidney disease, or unspecified chronic kidney disease: Secondary | ICD-10-CM | POA: Diagnosis not present

## 2023-04-07 DIAGNOSIS — Z79899 Other long term (current) drug therapy: Secondary | ICD-10-CM | POA: Insufficient documentation

## 2023-04-07 HISTORY — PX: CARDIOVERSION: EP1203

## 2023-04-07 HISTORY — PX: TRANSESOPHAGEAL ECHOCARDIOGRAM (CATH LAB): EP1270

## 2023-04-07 LAB — GLUCOSE, CAPILLARY: Glucose-Capillary: 215 mg/dL — ABNORMAL HIGH (ref 70–99)

## 2023-04-07 SURGERY — TRANSESOPHAGEAL ECHOCARDIOGRAM (TEE) (CATHLAB)
Anesthesia: Monitor Anesthesia Care

## 2023-04-07 MED ORDER — SODIUM CHLORIDE 0.9 % IV SOLN: INTRAVENOUS | Status: DC

## 2023-04-07 MED ORDER — PROPOFOL 10 MG/ML IV BOLUS
INTRAVENOUS | Status: DC | PRN
  Administered 2023-04-07: 50 mg via INTRAVENOUS
  Administered 2023-04-07: 30 mg via INTRAVENOUS
  Administered 2023-04-07: 20 mg via INTRAVENOUS
  Administered 2023-04-07 (×2): 50 mg via INTRAVENOUS

## 2023-04-07 MED ORDER — LIDOCAINE 2% (20 MG/ML) 5 ML SYRINGE
INTRAMUSCULAR | Status: DC | PRN
  Administered 2023-04-07: 60 mg via INTRAVENOUS
  Administered 2023-04-07: 100 mg via INTRAVENOUS

## 2023-04-07 MED ORDER — PHENYLEPHRINE HCL (PRESSORS) 10 MG/ML IV SOLN
INTRAVENOUS | Status: DC | PRN
  Administered 2023-04-07: 160 ug via INTRAVENOUS
  Administered 2023-04-07: 80 ug via INTRAVENOUS

## 2023-04-07 MED ORDER — PROPOFOL 500 MG/50ML IV EMUL
INTRAVENOUS | Status: DC | PRN
  Administered 2023-04-07: 80 ug/kg/min via INTRAVENOUS

## 2023-04-07 MED ORDER — PHENYLEPHRINE HCL-NACL 20-0.9 MG/250ML-% IV SOLN
INTRAVENOUS | Status: DC | PRN
  Administered 2023-04-07: 50 ug/min via INTRAVENOUS

## 2023-04-07 NOTE — Transfer of Care (Signed)
 Immediate Anesthesia Transfer of Care Note  Patient: [name] White  Procedure(s) Performed: TRANSESOPHAGEAL ECHOCARDIOGRAM CARDIOVERSION  Patient Location: PACU and Cath Lab  Anesthesia Type:General  Level of Consciousness: drowsy, patient cooperative, and responds to stimulation  Airway & Oxygen Therapy: Patient Spontanous Breathing and Patient connected to nasal cannula oxygen  Post-op Assessment: Report given to RN and Post -op Vital signs reviewed and stable  Post vital signs: Reviewed and stable  Last Vitals:  Vitals Value Taken Time  BP    Temp    Pulse    Resp    SpO2      Last Pain:  Vitals:   04/07/23 0837  TempSrc:   PainSc: 0-No pain         Complications: No notable events documented.

## 2023-04-07 NOTE — Anesthesia Postprocedure Evaluation (Signed)
 Anesthesia Post Note  Patient: [name] White  Procedure(s) Performed: TRANSESOPHAGEAL ECHOCARDIOGRAM CARDIOVERSION     Patient location during evaluation: PACU Anesthesia Type: MAC Level of consciousness: awake and alert Pain management: pain level controlled Vital Signs Assessment: post-procedure vital signs reviewed and stable Respiratory status: spontaneous breathing, nonlabored ventilation, respiratory function stable and patient connected to nasal cannula oxygen Cardiovascular status: stable and blood pressure returned to baseline Postop Assessment: no apparent nausea or vomiting Anesthetic complications: no   No notable events documented.  Last Vitals:  Vitals:   04/07/23 1041 04/07/23 1050  BP: 129/81 130/87  Pulse: 88 87  Resp: (!) 21 18  Temp:    SpO2: 97% 92%    Last Pain:  Vitals:   04/07/23 1020  TempSrc: Temporal  PainSc:                  Lynwood MARLA Cornea

## 2023-04-07 NOTE — Anesthesia Preprocedure Evaluation (Addendum)
 Anesthesia Evaluation  Patient identified by MRN, date of birth, ID band Patient awake    Reviewed: Allergy & Precautions, NPO status , Patient's Chart, lab work & pertinent test results, reviewed documented beta blocker date and time   History of Anesthesia Complications Negative for: history of anesthetic complications  Airway Mallampati: II  TM Distance: >3 FB     Dental no notable dental hx.    Pulmonary sleep apnea , COPD, former smoker   breath sounds clear to auscultation       Cardiovascular hypertension, pulmonary hypertension+ CAD and +CHF  + Valvular Problems/Murmurs MR  Rhythm:Regular Rate:Normal  AS s/p TAVR   Neuro/Psych neg Seizures PSYCHIATRIC DISORDERS Anxiety Depression     Neuromuscular disease    GI/Hepatic hiatal hernia,GERD  ,,(+) Hepatitis -  Endo/Other  diabetes, Type 2Hypothyroidism    Renal/GU Renal disease     Musculoskeletal  (+) Arthritis ,    Abdominal   Peds  Hematology  (+) Blood dyscrasia, anemia   Anesthesia Other Findings   Reproductive/Obstetrics                             Anesthesia Physical Anesthesia Plan  ASA: 3  Anesthesia Plan: MAC   Post-op Pain Management:    Induction:   PONV Risk Score and Plan: 1 and Ondansetron   Airway Management Planned:   Additional Equipment:   Intra-op Plan:   Post-operative Plan:   Informed Consent: I have reviewed the patients History and Physical, chart, labs and discussed the procedure including the risks, benefits and alternatives for the proposed anesthesia with the patient or authorized representative who has indicated his/her understanding and acceptance.       Plan Discussed with: CRNA  Anesthesia Plan Comments:        Anesthesia Quick Evaluation

## 2023-04-07 NOTE — Interval H&P Note (Signed)
 History and Physical Interval Note:  04/07/2023 9:24 AM  [name] White  has presented today for surgery, with the diagnosis of MITRO REGURG.  The various methods of treatment have been discussed with the patient and family. After consideration of risks, benefits and other options for treatment, the patient has consented to  Procedure(s): TRANSESOPHAGEAL ECHOCARDIOGRAM (N/A) CARDIOVERSION (N/A) as a surgical intervention.  The patient's history has been reviewed, patient examined, no change in status, stable for surgery.  I have reviewed the patient's chart and labs.  Questions were answered to the patient's satisfaction.     [name]

## 2023-04-08 ENCOUNTER — Encounter (HOSPITAL_COMMUNITY): Payer: Self-pay | Admitting: Cardiology

## 2023-04-09 NOTE — CV Procedure (Signed)
   TRANSESOPHAGEAL ECHOCARDIOGRAM GUIDED DIRECT CURRENT CARDIOVERSION  NAME:  [name] White    MRN: [id number] DOB:  11/16/1935    ADMIT DATE: 04/07/2023  INDICATIONS: Symptomatic atrial fibrillation, severe MR  PROCEDURE:   Informed consent was obtained prior to the procedure. The risks, benefits and alternatives for the procedure were discussed and the patient comprehended these risks.  Risks include, but are not limited to, cough, sore throat, vomiting, nausea, somnolence, esophageal and stomach trauma or perforation, bleeding, low blood pressure, aspiration, pneumonia, infection, trauma to the teeth and death.    After a procedural timeout, the patient was administered propofol  per anesthesia.  The patient's heart rate, blood pressure, and oxygen saturation were monitored continuously during the procedure. The period of conscious sedation was 25 minutes, of which I was present face-to-face 100% of this time.  The transesophageal probe was inserted in the esophagus and stomach without difficulty and multiple views were obtained.  The patient was kept under observation until the patient left the procedure room.  The patient left the procedure room in stable condition.    COMPLICATIONS:    Complications: No complications Patient tolerated procedure well.  KEY FINDINGS:  Severe mitral regurgitation, no LAA thrombus. Restricted posterior leaflet. Normal appearance and function of TAVR valve. Full Report to follow.   CARDIOVERSION:     Indications:  Symptomatic Atrial Fibrillation  Procedure Details:  Once the TEE was complete, the patient had the defibrillator pads placed in the anterior and posterior position. Once an appropriate level of sedation was confirmed, the patient was cardioverted x 1 with 200J of biphasic synchronized energy.  The patient converted to NSR.  There were no apparent complications.  The patient had normal neuro status and respiratory status post procedure with  vitals stable as recorded elsewhere.  Adequate airway was maintained throughout and vital signs monitored per protocol.  [name] Advanced Heart Failure 10:11 AM

## 2023-04-12 ENCOUNTER — Other Ambulatory Visit (HOSPITAL_COMMUNITY): Payer: Self-pay | Admitting: Internal Medicine

## 2023-04-14 LAB — ECHO TEE
AV Mean grad: 10 mm[Hg]
AV Peak grad: 18.3 mm[Hg]
Ao pk vel: 2.14 m/s
MV M vel: 5.07 m/s
MV Peak grad: 102.8 mm[Hg]
Radius: 1.3 cm

## 2023-05-09 ENCOUNTER — Telehealth (HOSPITAL_COMMUNITY): Payer: Self-pay | Admitting: Cardiology

## 2023-05-09 NOTE — Telephone Encounter (Signed)
 Called patient at [phone] to remind patient of appointment on Monday 05/12/23 at 10:20 AM with Dr. Zenaida.   Front office spoke to patient about appointment reminder and this patient confirmed that he will be here for his appointment on Monday 05/12/23 with Dr. Zenaida.

## 2023-05-12 ENCOUNTER — Ambulatory Visit (HOSPITAL_COMMUNITY)
Admission: RE | Admit: 2023-05-12 | Discharge: 2023-05-12 | Disposition: A | Discharge status: Home / Self Care | Payer: Medicare Other | Source: Ambulatory Visit | Attending: Cardiology | Admitting: Cardiology

## 2023-05-12 ENCOUNTER — Encounter (HOSPITAL_COMMUNITY): Payer: Self-pay | Admitting: Cardiology

## 2023-05-12 VITALS — BP 120/70 | HR 84 | Wt 228.2 lb

## 2023-05-12 DIAGNOSIS — E785 Hyperlipidemia, unspecified: Secondary | ICD-10-CM | POA: Insufficient documentation

## 2023-05-12 DIAGNOSIS — Z7901 Long term (current) use of anticoagulants: Secondary | ICD-10-CM | POA: Diagnosis not present

## 2023-05-12 DIAGNOSIS — I4821 Permanent atrial fibrillation: Secondary | ICD-10-CM | POA: Diagnosis not present

## 2023-05-12 DIAGNOSIS — I130 Hypertensive heart and chronic kidney disease with heart failure and stage 1 through stage 4 chronic kidney disease, or unspecified chronic kidney disease: Secondary | ICD-10-CM | POA: Insufficient documentation

## 2023-05-12 DIAGNOSIS — N1832 Chronic kidney disease, stage 3b: Secondary | ICD-10-CM | POA: Insufficient documentation

## 2023-05-12 DIAGNOSIS — G4730 Sleep apnea, unspecified: Secondary | ICD-10-CM | POA: Diagnosis not present

## 2023-05-12 DIAGNOSIS — Z79899 Other long term (current) drug therapy: Secondary | ICD-10-CM | POA: Insufficient documentation

## 2023-05-12 DIAGNOSIS — Z952 Presence of prosthetic heart valve: Secondary | ICD-10-CM | POA: Diagnosis not present

## 2023-05-12 DIAGNOSIS — E1122 Type 2 diabetes mellitus with diabetic chronic kidney disease: Secondary | ICD-10-CM | POA: Insufficient documentation

## 2023-05-12 DIAGNOSIS — Z7984 Long term (current) use of oral hypoglycemic drugs: Secondary | ICD-10-CM | POA: Insufficient documentation

## 2023-05-12 DIAGNOSIS — I5032 Chronic diastolic (congestive) heart failure: Secondary | ICD-10-CM | POA: Insufficient documentation

## 2023-05-12 DIAGNOSIS — E669 Obesity, unspecified: Secondary | ICD-10-CM | POA: Insufficient documentation

## 2023-05-12 DIAGNOSIS — I2510 Atherosclerotic heart disease of native coronary artery without angina pectoris: Secondary | ICD-10-CM | POA: Insufficient documentation

## 2023-05-12 DIAGNOSIS — I340 Nonrheumatic mitral (valve) insufficiency: Secondary | ICD-10-CM | POA: Diagnosis not present

## 2023-05-12 NOTE — Patient Instructions (Signed)
 Medication Changes:  No Changes In Medications at this time.   Referrals:  YOU HAVE BEEN REFERRED TO STRUCTURAL HEART THEY WILL REACH OUT TO YOU OR CALL TO ARRANGE THIS. PLEASE CALL US  WITH ANY CONCERNS   Follow-Up in: 3 MONTHS PLEASE CALL OUR OFFICE AROUND MARCH  TO GET SCHEDULED FOR YOUR APPOINTMENT. PHONE NUMBER IS [phone] OPTION 2   At the [facility], you and your health needs are our priority. We have a designated team specialized in the treatment of Heart Failure. This Care Team includes your primary Heart Failure Specialized Cardiologist (physician), Advanced Practice Providers (APPs- Physician Assistants and Nurse Practitioners), and Pharmacist who all work together to provide you with the care you need, when you need it.   You may see any of the following providers on your designated Care Team at your next follow up:  Dr. Jules Oar Dr. Peder Bourdon Dr. Alwin Baars Dr. Judyth Nunnery Nieves Bars, NP Ruddy Corral, [place] [facility] [place], [place] Dennise Fitz, NP [place] Lee, NP Luster Salters, PharmD   Please be sure to bring in all your medications bottles to every appointment.   Need to Contact Us :  If you have any questions or concerns before your next appointment please send us  a message through [place] or call our office at [phone].    TO LEAVE A MESSAGE FOR THE NURSE SELECT OPTION 2, PLEASE LEAVE A MESSAGE INCLUDING: YOUR NAME DATE OF BIRTH CALL BACK NUMBER REASON FOR CALL**this is important as we prioritize the call backs  YOU WILL RECEIVE A CALL BACK THE SAME DAY AS LONG AS YOU CALL BEFORE 4:00 PM

## 2023-05-12 NOTE — Progress Notes (Signed)
 ADVANCED HEART FAILURE FOLLOW UP CLINIC NOTE  Referring Physician: Genia Kettering, MD  Primary Care: Genia Kettering, MD Primary Cardiologist:  HPI: [name] White is a 88 y.o. male with a past medical history of nonobstructive CAD, hypertension, hyperlipidemia, obesity, sleep apnea, severe aortic stenosis s/p TAVR 04/2019, atrial flutter s/p ablation who presents for follow up of chronic diastolic heart failure.      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 Being treated at [facility]. Has lost over 40 pounds. BP down so clonidine  stopped.    Echo 10/20 EF 55-60% RV ok. Severe AS mean gradient Small effusion. Mild to mod MR/TR. S/p TAVR on 04/13/19.  Echo 05/17/19 EF 60-65%. TAVR ok. RV ok. Mod TR   Echo 01/17/21: EF 60-65% TAVR stable. Severe biatrial enlargement.      SUBJECTIVE:  Lost weight on increased diuretics since his last visit, reports that his breathing is mildly improved, though he still feels good. Sleeps well at night without orthopnea. He denies significant exertional shortness of breath but is limited somewhat by his MSK issues.   PMH, current medications, allergies, social history, and family history reviewed in epic.  PHYSICAL EXAM: Vitals:   05/12/23 1012  BP: 120/70  Pulse: 84  SpO2: 100%   GENERAL: Well nourished and in no apparent distress at rest.   PULM:  Normal wob CARDIAC:  JVP: Moderately elevated        Irregular rate and rhythm.  Blowing systolic murmur heard over the apex, soft systolic murmur heard in the right upper sternal border, 1+ lower extremity edema ABDOMEN: Soft, non-tender, non-distended.  DATA REVIEW   ECHO: Echo 10/20 EF 55-60% RV ok. Severe AS mean gradient Small effusion. Mild to mod MR/TR. S/p TAVR on 04/13/19.  Echo 05/17/19 EF 60-65%. TAVR ok. RV ok. Mod TR   Echo 01/17/21: EF 60-65% TAVR stable. Severe biatrial enlargement.   03/2023: Normal ejection fraction, appropriately performing bioprosthetic  aortic valve, worsening pulmonary artery pressures and likely new severe mitral regurgitation, eccentric jet that is not clearly seen on previous echocardiogram  CATH: R/L cath 12/20 Prox RCA to Mid RCA lesion is 30% stenosed. RPAV lesion is 30% stenosed. Ost Cx to Prox Cx lesion is 50% stenosed. Prox LAD to Mid LAD lesion is 20% stenosed  RA: 5, PA: 37/13 (22): PCWP 14, Fick cardiac output 9.8, index 4.3 but no evidence of shunt  ASSESSMENT & PLAN:  Mitral regurgitation: New, severe mitral regurgitation noted on TEE. Symptoms appear to be minimal at this point, but clearly having some hemodynamic impact with elevated PA pressure. Discussed at length with patient and we decided to pursue referral for likely mitraclip. Will send to Dr. Arlester Ladd as he complete his prior TAVR.  -TEE reviewed with patient -Lasix  40mg  daily resulting in significant diuresis -Structural consult  Chronic diastolic heart failure: Preserved ejection fraction in the setting of new severe MR.   -Continue Jardiance 25 mg daily -Continue Lasix  as above -recent labs reviewed -Consider amyloid workup in the future if orthostatic symptoms persist  Severe AS: TAVR 1/21.  Well-functioning valve on echocardiogram, will need SBE prophylaxis. -Yearly echoes for monitoring  Hypertension: BP well-controlled today.  CAD - No current s/s ischemia.  - cath 12/20 with stable non-obstructive CAD - Continue Crestor  Not on ASA due to Eliquis . Not on BB with bradycardia   CML - Following with Dr. Charlena Conner at [facility] - On nilotinib   Atrial fibrillation, permanent - Tikosyn   stopped 5/17 due to persistent AF. - S/p successful cardioversion 03/2023, unfortunately back in afib - Continue Eliquis  5 BID last Cr 1.05   Carotid Stenosis - Carotid duplex 12/2013; stable 0-39% bilateral ICA stenosis - Continue Crestor  - F/u as needed   DM2 - Continue Jardiance 25mg  daily   CKD 3b -Creatinine stable in clinic    Arta Lark, MD Advanced Heart Failure Mechanical Circulatory Support 05/12/23

## 2023-05-26 ENCOUNTER — Ambulatory Visit: Payer: Medicare Other | Admitting: Internal Medicine

## 2023-05-28 ENCOUNTER — Ambulatory Visit (INDEPENDENT_AMBULATORY_CARE_PROVIDER_SITE_OTHER): Payer: Medicare Other | Admitting: Internal Medicine

## 2023-05-28 ENCOUNTER — Encounter: Payer: Self-pay | Admitting: Internal Medicine

## 2023-05-28 VITALS — BP 118/72 | HR 84 | Temp 98.3°F | Ht 74.0 in | Wt 228.0 lb

## 2023-05-28 DIAGNOSIS — E039 Hypothyroidism, unspecified: Secondary | ICD-10-CM | POA: Diagnosis not present

## 2023-05-28 DIAGNOSIS — Z7984 Long term (current) use of oral hypoglycemic drugs: Secondary | ICD-10-CM | POA: Diagnosis not present

## 2023-05-28 DIAGNOSIS — E1169 Type 2 diabetes mellitus with other specified complication: Secondary | ICD-10-CM

## 2023-05-28 DIAGNOSIS — C9210 Chronic myeloid leukemia, BCR/ABL-positive, not having achieved remission: Secondary | ICD-10-CM | POA: Diagnosis not present

## 2023-05-28 DIAGNOSIS — E785 Hyperlipidemia, unspecified: Secondary | ICD-10-CM | POA: Diagnosis not present

## 2023-05-28 DIAGNOSIS — E669 Obesity, unspecified: Secondary | ICD-10-CM

## 2023-05-28 DIAGNOSIS — N3281 Overactive bladder: Secondary | ICD-10-CM | POA: Insufficient documentation

## 2023-05-28 DIAGNOSIS — R350 Frequency of micturition: Secondary | ICD-10-CM | POA: Insufficient documentation

## 2023-05-28 DIAGNOSIS — Z7901 Long term (current) use of anticoagulants: Secondary | ICD-10-CM | POA: Insufficient documentation

## 2023-05-28 DIAGNOSIS — N329 Bladder disorder, unspecified: Secondary | ICD-10-CM | POA: Insufficient documentation

## 2023-05-28 DIAGNOSIS — R351 Nocturia: Secondary | ICD-10-CM | POA: Insufficient documentation

## 2023-05-28 NOTE — Progress Notes (Signed)
 Subjective:  Patient ID: [name] White, male    DOB: 1936-01-11  Age: 88 y.o. MRN: [id number]  CC: No chief complaint on file.   HPI [name] White presents for hypothyroidism, DM, s/p TAVR, CML PSA and other labs - done at the [facility]  Outpatient Medications Prior to Visit  Medication Sig Dispense Refill   acetaminophen (TYLENOL) 500 MG tablet Take 500 mg by mouth every 6 (six) hours as needed.     amLODipine (NORVASC) 10 MG tablet Take 10 mg by mouth daily.     amoxicillin (AMOXIL) 500 MG tablet Take 4 capsules (2,000 mg) one hour prior to all dental visits. 8 tablet 11   apixaban (ELIQUIS) 5 MG TABS tablet Take 5 mg by mouth 2 (two) times daily.     carboxymethylcellulose (REFRESH PLUS) 0.5 % SOLN Place 1 drop into both eyes 4 (four) times daily as needed (dry eyes).     cholecalciferol (VITAMIN D3) 25 MCG (1000 UNIT) tablet Take 1,000 Units by mouth at bedtime.     CINNAMON PO Take 2,000 mg by mouth 2 (two) times daily.      Coenzyme Q10 ([employer] COQ-10) 200 MG capsule Take 200 mg by mouth every evening.      empagliflozin (JARDIANCE) 25 MG TABS tablet Take 25 mg by mouth daily.     ferrous sulfate 325 (65 FE) MG tablet Take 325 mg by mouth every Monday, Wednesday, and Friday.     finasteride (PROSCAR) 5 MG tablet Take 5 mg by mouth every evening.     fluocinonide (LIDEX) 0.05 % external solution Apply 1 Application topically 2 (two) times daily as needed (scalp irritation).     furosemide (LASIX) 20 MG tablet Take 2 tablets (40 mg total) by mouth daily. 60 tablet 3   latanoprost (XALATAN) 0.005 % ophthalmic solution Place 1 drop into both eyes at bedtime.     levothyroxine (SYNTHROID) 200 MCG tablet Take 200 mcg by mouth daily before breakfast.     Melatonin 10 MG CAPS Take 10 mg by mouth at bedtime as needed (sleep).     metFORMIN (GLUCOPHAGE-XR) 500 MG 24 hr tablet Take 500 mg by mouth 2 (two) times daily.     mirabegron ER (MYRBETRIQ) 50 MG TB24 tablet Take 50 mg by mouth daily.      nilotinib (TASIGNA) 150 MG capsule Take 150 mg by mouth 2 (two) times daily. Take on an empty stomach, 1 hr before or 2 hrs after food.     PARoxetine (PAXIL) 40 MG tablet Take 1 tablet (40 mg total) by mouth every morning. 90 tablet 1   potassium chloride SA (KLOR-CON M20) 20 MEQ tablet TAKE 1 TABLET BY MOUTH EVERY DAY 90 tablet 3   rosuvastatin (CRESTOR) 10 MG tablet TAKE 1 TABLET (10 MG TOTAL) BY MOUTH DAILY. PLEASE SCHEDULE AN APPOINTMENT FOR FURTHER REFILLS 90 tablet 1   spironolactone (ALDACTONE) 25 MG tablet Take 25 mg by mouth daily.     timolol (BETIMOL) 0.5 % ophthalmic solution Place 1 drop into both eyes daily.     traZODone (DESYREL) 50 MG tablet Take 50 mg by mouth at bedtime.     TURMERIC PO Take 1,000 mg by mouth every evening.      No facility-administered medications prior to visit.    ROS: Review of Systems  Constitutional:  Negative for appetite change, fatigue and unexpected weight change.  HENT:  Negative for congestion, nosebleeds, sneezing, sore throat and trouble swallowing.  Eyes:  Negative for itching and visual disturbance.  Respiratory:  Negative for cough.   Cardiovascular:  Negative for chest pain, palpitations and leg swelling.  Gastrointestinal:  Negative for abdominal distention, blood in stool, diarrhea and nausea.  Genitourinary:  Negative for frequency and hematuria.  Musculoskeletal:  Positive for gait problem. Negative for back pain, joint swelling and neck pain.  Skin:  Negative for rash.  Neurological:  Positive for dizziness. Negative for tremors, speech difficulty and weakness.  Psychiatric/Behavioral:  Negative for agitation, dysphoric mood, sleep disturbance and suicidal ideas. The patient is not nervous/anxious.     Objective:  BP 118/72   Pulse 84   Temp 98.3 F (36.8 C) (Oral)   Ht 6\' 2"  (1.88 m)   Wt 228 lb (103.4 kg)   SpO2 95%   BMI 29.27 kg/m   BP Readings from Last 3 Encounters:  05/28/23 118/72  05/12/23 120/70   04/07/23 130/87    Wt Readings from Last 3 Encounters:  05/28/23 228 lb (103.4 kg)  05/12/23 228 lb 3.2 oz (103.5 kg)  04/07/23 240 lb (108.9 kg)    Physical Exam Constitutional:      General: He is not in acute distress.    Appearance: Normal appearance. He is well-developed.     Comments: NAD  Eyes:     Conjunctiva/sclera: Conjunctivae normal.     Pupils: Pupils are equal, round, and reactive to light.  Neck:     Thyroid: No thyromegaly.     Vascular: No JVD.  Cardiovascular:     Rate and Rhythm: Normal rate and regular rhythm.     Heart sounds: Normal heart sounds. No murmur heard.    No friction rub. No gallop.  Pulmonary:     Effort: Pulmonary effort is normal. No respiratory distress.     Breath sounds: Normal breath sounds. No wheezing or rales.  Chest:     Chest wall: No tenderness.  Abdominal:     General: Bowel sounds are normal. There is no distension.     Palpations: Abdomen is soft. There is no mass.     Tenderness: There is no abdominal tenderness. There is no guarding or rebound.  Musculoskeletal:        General: No tenderness. Normal range of motion.     Cervical back: Normal range of motion.     Right lower leg: No edema.     Left lower leg: No edema.  Lymphadenopathy:     Cervical: No cervical adenopathy.  Skin:    General: Skin is warm and dry.     Findings: Rash present.  Neurological:     Mental Status: He is alert and oriented to person, place, and time.     Cranial Nerves: No cranial nerve deficit.     Motor: No abnormal muscle tone.     Coordination: Coordination normal.     Gait: Gait abnormal.     Deep Tendon Reflexes: Reflexes are normal and symmetric.  Psychiatric:        Behavior: Behavior normal.        Thought Content: Thought content normal.        Judgment: Judgment normal.   Using a cane  Lab Results  Component Value Date   WBC 8.7 03/25/2023   HGB 11.4 (L) 03/25/2023   HCT 36.7 (L) 03/25/2023   PLT 247 03/25/2023    GLUCOSE 141 (H) 03/25/2023   CHOL 171 01/22/2018   TRIG 98 01/22/2018   HDL 57 01/22/2018  LDLDIRECT 141.6 11/12/2010   LDLCALC 94 01/22/2018   ALT 22 07/14/2019   AST 40 (H) 07/14/2019   NA 137 03/25/2023   K 4.8 03/25/2023   CL 107 03/25/2023   CREATININE 1.20 03/25/2023   BUN 22 03/25/2023   CO2 21 (L) 03/25/2023   TSH 1.25 07/14/2019   PSA 1.42 09/13/2013   INR 1.1 04/09/2019   HGBA1C 8.1 11/08/2022   MICROALBUR 6.0 (H) 01/10/2017    No results found.  Assessment & Plan:   Problem List Items Addressed This Visit     Hypothyroidism - Primary (Chronic)   On levothyroxine Check TSH      Relevant Orders   TSH   Urinalysis   CBC with Differential/Platelet   Lipid panel   Comprehensive metabolic panel   Hemoglobin A1c   Hyperlipidemia (Chronic)   Relevant Orders   TSH   Urinalysis   CBC with Differential/Platelet   Lipid panel   Comprehensive metabolic panel   Hemoglobin A1c   Type 2 diabetes mellitus with obesity (HCC) (Chronic)   A1c and other labs - done at the [place] On Metformin and Jardiance per [facility]. CBGs 170-180      Relevant Orders   TSH   Urinalysis   CBC with Differential/Platelet   Lipid panel   Comprehensive metabolic panel   Hemoglobin A1c   CML (chronic myelocytic leukemia) (HCC)   Doing well on Rx         No orders of the defined types were placed in this encounter.     Follow-up: Return in about 3 months (around 08/25/2023) for a follow-up visit.  Sonda Primes, MD

## 2023-05-28 NOTE — Assessment & Plan Note (Signed)
Doing well on Rx.

## 2023-05-28 NOTE — Assessment & Plan Note (Signed)
 A1c and other labs - done at the [facility] On Metformin and Jardiance per [facility]. CBGs 170-180

## 2023-05-28 NOTE — Assessment & Plan Note (Signed)
On levo thyroxine  Check TSH

## 2023-06-02 ENCOUNTER — Other Ambulatory Visit (HOSPITAL_COMMUNITY): Payer: Self-pay | Admitting: Cardiology

## 2023-06-02 ENCOUNTER — Other Ambulatory Visit: Payer: Self-pay | Admitting: Internal Medicine

## 2023-06-05 ENCOUNTER — Ambulatory Visit: Payer: Medicare Other | Attending: Cardiovascular Disease | Admitting: Cardiovascular Disease

## 2023-06-05 ENCOUNTER — Encounter: Payer: Self-pay | Admitting: Cardiovascular Disease

## 2023-06-05 VITALS — BP 118/64 | HR 76 | Resp 16 | Ht 74.0 in | Wt 222.4 lb

## 2023-06-05 DIAGNOSIS — I340 Nonrheumatic mitral (valve) insufficiency: Secondary | ICD-10-CM | POA: Diagnosis not present

## 2023-06-05 DIAGNOSIS — I2510 Atherosclerotic heart disease of native coronary artery without angina pectoris: Secondary | ICD-10-CM

## 2023-06-05 DIAGNOSIS — Z01810 Encounter for preprocedural cardiovascular examination: Secondary | ICD-10-CM | POA: Diagnosis not present

## 2023-06-05 NOTE — H&P (View-Only) (Signed)
 Cardiology Office Note:    Date:  06/14/2023   ID:  [name] White, DOB 04/24/1935, MRN [id number]  PCP:  [name] Garter, MD   [facility] Providers Cardiologist:  None Advanced Heart Failure:  [name] Meres, MD     Referring MD: [name] Garter, MD   Chief Complaint  Patient presents with   Mitral Regurgitation    History of Present Illness:    [name] White is a 88 y.o. male referred by Dr. Elwyn White for evaluation of mitral regurgitation.  The patient is known to me as he underwent TAVR in 2021.  The patient underwent transesophageal echo in December 2024 for TEE/cardioversion.  This demonstrated severe eccentric mitral regurgitation with a restricted posterior leaflet.  He is referred for consideration of mitral valve TEER.  The patient is here alone today. He's followed for CML at the [facility] and he tells me that he requires iron infusions now. He underwent cardioversion a few months ago but had recurrent AF at follow-up. He reports being primarily limited by musculoskeletal issues and gait unsteadiness. He currently denies shortness of breath, edema, chest pain, orthopnea, or PND. His diuretics were increased and he seems to be feeling better since that time.    Current Medications: Current Meds  Medication Sig   acetaminophen (TYLENOL) 500 MG tablet Take 500 mg by mouth every 6 (six) hours as needed for moderate pain (pain score 4-6).   amLODipine (NORVASC) 10 MG tablet Take 10 mg by mouth daily.   amoxicillin (AMOXIL) 500 MG tablet Take 4 capsules (2,000 mg) one hour prior to all dental visits.   apixaban (ELIQUIS) 5 MG TABS tablet Take 5 mg by mouth 2 (two) times daily.   cholecalciferol (VITAMIN D3) 25 MCG (1000 UNIT) tablet Take 1,000 Units by mouth at bedtime.   CINNAMON PO Take 2,000 mg by mouth 2 (two) times daily.    Coenzyme Q10 ([employer] COQ-10) 200 MG capsule Take 200 mg by mouth every evening.    empagliflozin (JARDIANCE) 25 MG TABS tablet Take 25  mg by mouth daily.   finasteride (PROSCAR) 5 MG tablet Take 5 mg by mouth every evening.   furosemide (LASIX) 20 MG tablet TAKE 2 TABLETS (40 MG TOTAL) BY MOUTH DAILY.   latanoprost (XALATAN) 0.005 % ophthalmic solution Place 1 drop into both eyes at bedtime.   levothyroxine (SYNTHROID) 200 MCG tablet Take 200 mcg by mouth daily before breakfast.   Melatonin 10 MG CAPS Take 10 mg by mouth at bedtime as needed (sleep).   metFORMIN (GLUCOPHAGE-XR) 500 MG 24 hr tablet Take 500 mg by mouth 2 (two) times daily.   mirabegron ER (MYRBETRIQ) 50 MG TB24 tablet Take 50 mg by mouth daily.   nilotinib (TASIGNA) 150 MG capsule Take 150 mg by mouth 2 (two) times daily. Take on an empty stomach, 1 hr before or 2 hrs after food.   PARoxetine (PAXIL) 40 MG tablet Take 1 tablet (40 mg total) by mouth every morning.   potassium chloride SA (KLOR-CON M20) 20 MEQ tablet TAKE 1 TABLET BY MOUTH EVERY DAY   rosuvastatin (CRESTOR) 10 MG tablet TAKE 1 TABLET (10 MG TOTAL) BY MOUTH DAILY. PLEASE SCHEDULE AN APPOINTMENT FOR FURTHER REFILLS   spironolactone (ALDACTONE) 25 MG tablet Take 25 mg by mouth daily.   timolol (BETIMOL) 0.5 % ophthalmic solution Place 1 drop into both eyes daily.   traZODone (DESYREL) 50 MG tablet Take 50 mg by mouth at bedtime.  TURMERIC PO Take 1,000 mg by mouth every evening.    [DISCONTINUED] ferrous sulfate 325 (65 FE) MG tablet Take 325 mg by mouth every Monday, Wednesday, and Friday.     Allergies:   Ace inhibitors, Benazepril, Codeine, Statins, Piroxicam, Pseudoephedrine, Brinzolamide-brimonidine, Dasatinib, and Betadine [povidone iodine]   ROS:   Please see the history of present illness.    All other systems reviewed and are negative.  EKGs/Labs/Other Studies Reviewed:    The following studies were reviewed today: Cardiac Studies & Procedures   ______________________________________________________________________________________________ CARDIAC CATHETERIZATION  CARDIAC  CATHETERIZATION 03/18/2019  Narrative  Prox RCA to Mid RCA lesion is 30% stenosed.  RPAV lesion is 30% stenosed.  Ost Cx to Prox Cx lesion is 50% stenosed.  Prox LAD to Mid LAD lesion is 20% stenosed.  Findings:  Ao = 82/51(65) Lv = 138/8 RA = 5 RV = 38/6 PA = 37/13 (22) PCW = 14 Fick cardiac output/index = 9.8/4.3 PVR = 0.7 WU FA sat = 96% PA sat = 78%, 77% High SVC 75%  AoV = peak gradient 51mm HG, mean gradient 36 mmHG, AVA 1.4 cm2  Assessment:  1. Mild non-obstructive CAD 2. Moderate to severe AS 3.  Normal LV function by echo  Plan/Discussion:  Refer for TAVR eval.  [name] Meres, MD 3:17 PM  Findings Coronary Findings Diagnostic  Dominance: Right  Left Anterior Descending Prox LAD to Mid LAD lesion is 20% stenosed.  Left Circumflex Ost Cx to Prox Cx lesion is 50% stenosed.  Right Coronary Artery Prox RCA to Mid RCA lesion is 30% stenosed.  Right Posterior Atrioventricular Artery RPAV lesion is 30% stenosed.  Intervention  No interventions have been documented.   STRESS TESTS  NM MYOCAR MULTI W/SPECT W 07/24/2005   ECHOCARDIOGRAM  ECHOCARDIOGRAM COMPLETE 03/25/2023  Narrative ECHOCARDIOGRAM REPORT    Patient Name:   [name] White Date of Exam: 03/25/2023 Medical Rec #:  [id number]        Height:       74.0 in Accession #:    [id number]       Weight:       225.0 lb Date of Birth:  1935-06-17        BSA:          2.285 m Patient Age:    87 years         BP:           120/76 mmHg Patient Gender: M                HR:           86 bpm. Exam Location:  Outpatient  Procedure: 2D Echo, Color Doppler and Cardiac Doppler  Indications:    I50.9 Congestive Heart Disease  History:        Patient has prior history of Echocardiogram examinations. CHF, CAD, 26mm Edwards TAVR (01/21 implanted), Emphysema, Arrythmias:Atrial Flutter; Risk Factors:Hypertension. Aortic Valve: 26 mm Edwards Sapien prosthetic, stented (TAVR) valve is  present in the aortic position.  Sonographer:    [name] White, RDCS Referring Phys: [id number] [name] White   Sonographer Comments: Global longitudinal strain was attempted. IMPRESSIONS   1. Left ventricular ejection fraction, by estimation, is 55 to 60%. The left ventricle has normal function. The left ventricle has no regional wall motion abnormalities. There is mild left ventricular hypertrophy. Left ventricular diastolic parameters are indeterminate. 2. Right ventricular systolic function is mildly reduced. The right ventricular size is normal. There is severely  elevated pulmonary artery systolic pressure. The estimated right ventricular systolic pressure is 65.2 mmHg. 3. Left atrial size was severely dilated. 4. Right atrial size was severely dilated. 5. Tricuspid valve regurgitation is moderate. 6. The aortic valve has been repaired/replaced. Aortic valve regurgitation is not visualized. There is a 26 mm Edwards Sapien prosthetic (TAVR) valve present in the aortic position. Vmax 2.1 m/s, MG , EOA 1.1 cm^2, DI 0.48 7. The inferior vena cava is dilated in size with >50% respiratory variability, suggesting right atrial pressure of 8 mmHg. 8. The mitral valve is abnormal. Moderate to severe mitral valve regurgitation. Mild mitral stenosis. The mean mitral valve gradient is 5.0 mmHg with average heart rate of 74 bpm. Eccentric posterior directed MR jet, difficult to quantify given eccentric jet, but suspect severe MR. Recommend TEE or cardiac MRI for further evaluation  FINDINGS Left Ventricle: Left ventricular ejection fraction, by estimation, is 55 to 60%. The left ventricle has normal function. The left ventricle has no regional wall motion abnormalities. The left ventricular internal cavity size was normal in size. There is mild left ventricular hypertrophy. Left ventricular diastolic parameters are indeterminate.  Right Ventricle: The right ventricular size is normal. No  increase in right ventricular wall thickness. Right ventricular systolic function is mildly reduced. There is severely elevated pulmonary artery systolic pressure. The tricuspid regurgitant velocity is 3.78 m/s, and with an assumed right atrial pressure of 8 mmHg, the estimated right ventricular systolic pressure is 65.2 mmHg.  Left Atrium: Left atrial size was severely dilated.  Right Atrium: Right atrial size was severely dilated.  Pericardium: There is no evidence of pericardial effusion.  Mitral Valve: The mitral valve is abnormal. Moderate to severe mitral valve regurgitation. Mild mitral valve stenosis. MV peak gradient, 12.4 mmHg. The mean mitral valve gradient is 5.0 mmHg with average heart rate of 74 bpm.  Tricuspid Valve: The tricuspid valve is normal in structure. Tricuspid valve regurgitation is moderate.  Aortic Valve: The aortic valve has been repaired/replaced. Aortic valve regurgitation is trivial. Aortic valve mean gradient measures 9.5 mmHg. Aortic valve peak gradient measures 17.1 mmHg. Aortic valve area, by VTI measures 1.13 cm. There is a 26 mm Edwards Sapien prosthetic, stented (TAVR) valve present in the aortic position.  Pulmonic Valve: The pulmonic valve was not well visualized. Pulmonic valve regurgitation is trivial.  Aorta: The aortic root and ascending aorta are structurally normal, with no evidence of dilitation.  Venous: The inferior vena cava is dilated in size with greater than 50% respiratory variability, suggesting right atrial pressure of 8 mmHg.  IAS/Shunts: The interatrial septum was not well visualized.   LEFT VENTRICLE PLAX 2D LVIDd:         4.60 cm LVIDs:         3.40 cm LV PW:         1.20 cm LV IVS:        1.10 cm LVOT diam:     1.70 cm LV SV:         47 LV SV Index:   21 LVOT Area:     2.27 cm  LV Volumes (MOD) LV vol d, MOD A2C: 126.0 ml LV vol d, MOD A4C: 118.0 ml LV vol s, MOD A2C: 56.8 ml LV vol s, MOD A4C: 42.5 ml LV SV MOD  A2C:     69.2 ml LV SV MOD A4C:     118.0 ml LV SV MOD BP:      73.9 ml  RIGHT VENTRICLE  IVC RV Basal diam:  3.80 cm  IVC diam: 2.50 cm TAPSE (M-mode): 1.3 cm  LEFT ATRIUM              Index        RIGHT ATRIUM           Index LA diam:        5.20 cm  2.28 cm/m   RA Area:     27.25 cm LA Vol (A2C):   141.0 ml 61.71 ml/m  RA Volume:   91.80 ml  40.17 ml/m LA Vol (A4C):   136.0 ml 59.52 ml/m LA Biplane Vol: 142.0 ml 62.14 ml/m AORTIC VALVE                     PULMONIC VALVE AV Area (Vmax):    1.11 cm      PV Vmax:          0.88 m/s AV Area (Vmean):   1.08 cm      PV Peak grad:     3.1 mmHg AV Area (VTI):     1.13 cm      PR End Diast Vel: 6.97 msec AV Vmax:           207.00 cm/s AV Vmean:          141.500 cm/s AV VTI:            0.418 m AV Peak Grad:      17.1 mmHg AV Mean Grad:      9.5 mmHg LVOT Vmax:         101.25 cm/s LVOT Vmean:        67.350 cm/s LVOT VTI:          0.207 m LVOT/AV VTI ratio: 0.50  AORTA Ao Root diam: 3.00 cm Ao Asc diam:  3.30 cm Ao Arch diam: 3.1 cm  MITRAL VALVE                  TRICUSPID VALVE MV Area (PHT): 2.87 cm       TR Peak grad:   57.2 mmHg MV Area VTI:   1.19 cm       TR Vmax:        378.00 cm/s MV Peak grad:  12.4 mmHg MV Mean grad:  5.0 mmHg       SHUNTS MV Vmax:       1.76 m/s       Systemic VTI:  0.21 m MV Vmean:      104.0 cm/s     Systemic Diam: 1.70 cm MV Decel Time: 264 msec MR Peak grad:    122.3 mmHg MR Mean grad:    79.0 mmHg MR Vmax:         553.00 cm/s MR Vmean:        422.0 cm/s MR PISA:         14.14 cm MR PISA Eff ROA: 103 mm MR PISA Radius:  1.50 cm MV E velocity: 141.00 cm/s  Epifanio Lesches MD Electronically signed by Epifanio Lesches MD Signature Date/Time: 03/25/2023/2:10:16 PM    Final   TEE  ECHO TEE 04/07/2023  Narrative TRANSESOPHOGEAL ECHO REPORT    Patient Name:   [name] White Date of Exam: 04/07/2023 Medical Rec #:  [id number]        Height:       74.0  in Accession #:    [id number]       Weight:  237.6 lb Date of Birth:  September 04, 1935        BSA:          2.339 m Patient Age:    87 years         BP:           129/87 mmHg Patient Gender: M                HR:           72 bpm. Exam Location:  Inpatient  Procedure: Transesophageal Echo, 3D Echo, Color Doppler and Cardiac Doppler  Indications:     Mitral Regurgitation i34.0  History:         Patient has prior history of Echocardiogram examinations, most recent 03/25/2023. Arrythmias:Atrial Fibrillation; Risk Factors:Hypertension, Diabetes, Dyslipidemia and Sleep Apnea. Aortic Valve: 26 mm Edwards Sapien prosthetic, stented (TAVR) valve is present in the aortic position. Procedure Date: 04/13/19.  Sonographer:     Irving Burton Senior RDCS Referring Phys:  [id number] [name] White Diagnosing Phys: Clearnce Hasten  PROCEDURE: After discussion of the risks and benefits of a TEE, an informed consent was obtained from the patient. The transesophogeal probe was passed without difficulty through the esophogus of the patient. Sedation performed by different physician. The patient was monitored while under deep sedation. Anesthestetic sedation was provided intravenously by Anesthesiology: 501mg  of Propofol, 160mg  of Lidocaine. The patient developed no complications during the procedure. A successful direct current cardioversion was performed at 200 joules with 1 attempt.  IMPRESSIONS   1. Left ventricular ejection fraction, by estimation, is 50 to 55%. The left ventricle has low normal function. 2. RVSP mildly elevated but likely underestimates true PA pressures.. Right ventricular systolic function is normal. The right ventricular size is normal. There is mildly elevated pulmonary artery systolic pressure. 3. Left atrial size was moderately dilated. No left atrial/left atrial appendage thrombus was detected. 4. Right atrial size was mildly dilated. 5. Severe mitral regurgitation with reversal of  flow in the pulmonary veins. MVA 6.1 cm2. EROA 0.75cm2, MR volume . Severely restricted posterior leaflet measured at around 1cm. Will refer for assessment of mTEER.. The mitral valve is abnormal. Severe mitral valve regurgitation. No evidence of mitral stenosis. The mean mitral valve gradient is 3.0 mmHg. 6. Tricuspid valve regurgitation is mild to moderate. 7. The aortic valve has been repaired/replaced. Aortic valve regurgitation is not visualized. There is a 26 mm Edwards Sapien prosthetic (TAVR) valve present in the aortic position. Procedure Date: 04/13/19. Echo findings are consistent with normal structure and function of the aortic valve prosthesis. Aortic valve mean gradient measures 10.0 mmHg. Aortic valve Vmax measures 2.14 m/s. 8. There is mild (Grade II) plaque. 9. The left upper pulmonary vein and right upper pulmonary vein are abnormal.  Conclusion(s)/Recommendation(s): No LA/LAA thrombus identified. Successful cardioversion performed with restoration of normal sinus rhythm.  FINDINGS Left Ventricle: Left ventricular ejection fraction, by estimation, is 50 to 55%. The left ventricle has low normal function. The left ventricular internal cavity size was normal in size.  Right Ventricle: RVSP mildly elevated but likely underestimates true PA pressures. The right ventricular size is normal. No increase in right ventricular wall thickness. Right ventricular systolic function is normal. There is mildly elevated pulmonary artery systolic pressure.  Left Atrium: Left atrial size was moderately dilated. No left atrial/left atrial appendage thrombus was detected.  Right Atrium: Right atrial size was mildly dilated.  Pericardium: There is no evidence of pericardial effusion.  Mitral Valve: Severe mitral regurgitation  with reversal of flow in the pulmonary veins. MVA 6.1 cm2. EROA 0.75cm2, MR volume . Severely restricted posterior leaflet measured at around 1cm. Will refer for  assessment of mTEER. The mitral valve is abnormal. There is mild thickening of the mitral valve leaflet(s). Mild mitral annular calcification. Severe mitral valve regurgitation, with wall-impinging jet. No evidence of mitral valve stenosis. MV peak gradient, 8.6 mmHg. The mean mitral valve gradient is 3.0 mmHg.  Tricuspid Valve: The tricuspid valve is normal in structure. Tricuspid valve regurgitation is mild to moderate.  Aortic Valve: The aortic valve has been repaired/replaced. Aortic valve regurgitation is not visualized. Aortic valve mean gradient measures 10.0 mmHg. Aortic valve peak gradient measures 18.3 mmHg. There is a 26 mm Edwards Sapien prosthetic, stented (TAVR) valve present in the aortic position. Procedure Date: 04/13/19. Echo findings are consistent with normal structure and function of the aortic valve prosthesis.  Pulmonic Valve: The pulmonic valve was normal in structure. Pulmonic valve regurgitation is not visualized.  Aorta: The aortic root, ascending aorta and aortic arch are all structurally normal, with no evidence of dilitation or obstruction. There is mild (Grade II) plaque.  Venous: The left upper pulmonary vein and right upper pulmonary vein are abnormal. A pattern of systolic flow reversal, suggestive of severe mitral regurgitation is recorded from the left upper pulmonary vein and the right upper pulmonary vein.  IAS/Shunts: No atrial level shunt detected by color flow Doppler.  Additional Comments: Spectral Doppler performed.  AORTIC VALVE AV Vmax:           214.00 cm/s AV Vmean:          147.000 cm/s AV VTI:            0.377 m AV Peak Grad:      18.3 mmHg AV Mean Grad:      10.0 mmHg LVOT Vmax:         100.00 cm/s LVOT Vmean:        73.800 cm/s LVOT VTI:          0.214 m LVOT/AV VTI ratio: 0.57  MITRAL VALVE                  TRICUSPID VALVE MV Peak grad: 8.6 mmHg        TR Peak grad:   35.5 mmHg MV Mean grad: 3.0 mmHg        TR Vmax:        298.00  cm/s MV Vmax:      1.47 m/s MV Vmean:     73.6 cm/s       SHUNTS MR Peak grad:    102.8 mmHg   Systemic VTI: 0.21 m MR Mean grad:    56.0 mmHg MR Vmax:         507.00 cm/s MR Vmean:        349.0 cm/s MR PISA:         10.62 cm MR PISA Eff ROA: 75 mm MR PISA Radius:  1.30 cm  Clearnce Hasten Electronically signed by Clearnce Hasten Signature Date/Time: 04/14/2023/8:49:13 AM    Final    CT SCANS  CT CORONARY MORPH W/CTA COR W/SCORE 03/31/2019  Addendum 04/01/2019 10:12 PM ADDENDUM REPORT: 04/01/2019 22:09  CLINICAL DATA:  88 year old male with severe aortic stenosis being evaluated for a TAVR procedure.  EXAM: Cardiac TAVR CT  TECHNIQUE: The patient was scanned on a [employer]. A 120 kV retrospective scan was triggered in the descending thoracic aorta at 111  HU's. Gantry rotation speed was 250 msecs and collimation was .6 mm. No beta blockade or nitro were given. The 3D data set was reconstructed in 5% intervals of the R-R cycle. Systolic and diastolic phases were analyzed on a dedicated work station using MPR, MIP and VRT modes. The patient received 80 cc of contrast.  FINDINGS: Aortic Valve: Trileaflet aortic valve with severely thickened and calcified leaflets and no calcifications extending into the LVOT.  Aorta: Mild dilatation of the ascending aorta measuring 44 x 43 mm. Mild diffuse atherosclerotic plaque and calcifications. No dissection.  Sinotubular Junction: 31 x 30 mm  Ascending Thoracic Aorta: 36 x 36 mm  Aortic Arch: 28 x 28 mm  Descending Thoracic Aorta: 28 x 28 mm  Sinus of Valsalva Measurements:  Non-coronary: 35 mm  Right -coronary: 33 mm  Left -coronary: 36 mm  Coronary Artery Height above Annulus:  Left Main: 14 mm  Right Coronary: 18 mm  Virtual Basal Annulus Measurements:  Maximum/Minimum Diameter: 27.8 x 24.7 mm  Mean Diameter: 25.4 mm  Perimeter: 81.2 mm  Area: 507 mm2  Optimum Fluoroscopic Angle for  Delivery: LAO 5 CAU 5  IMPRESSION: 1. Trileaflet aortic valve with severely thickened and calcified leaflets and no calcifications extending into the LVOT. Aortic valve calcium score is 2881 consistent with severe aortic stenosis. Annular measurements are suitable for delivery of a 26 mm Edwards-SAPIEN 3 Ultra valve.  2. Sufficient coronary to annulus distance.  3. Optimum Fluoroscopic Angle for Delivery: LAO 5 CAU 5  4. There is a small filling defect in the most superior portion of the left atrial appendage that gets smaller on the delayed images and most probably represents poor contrast mixing.   Electronically Signed By: Tobias Alexander On: 04/01/2019 22:09  Narrative EXAM: OVER-READ INTERPRETATION  CT CHEST  The following report is an over-read performed by radiologist Dr. Trudie Reed of [facility] on 03/31/2019. This over-read does not include interpretation of cardiac or coronary anatomy or pathology. The coronary calcium score/coronary CTA interpretation by the cardiologist is attached.  COMPARISON:  Cardiac CTA 08/13/2011.  FINDINGS: Extracardiac findings were described separately under dictation for contemporaneously obtained CTA chest, abdomen and pelvis.  IMPRESSION: Please see separate dictation for contemporaneously obtained CTA chest, abdomen and pelvis dated 03/31/2019 for full description of relevant extracardiac findings.  Electronically Signed: By: Trudie Reed M.D. On: 03/31/2019 13:12   CT SCANS  CT CORONARY MORPH W/CTA COR W/SCORE 08/13/2011  Addendum 08/14/2011 11:50 AM **ADDENDUM** CREATED: 08/14/2011 11:45:09  OVER-READ INTERPRETATION - CT CHEST  The following report is an over-read performed by radiologist Dr. Aubery Lapping. [place] Hard, M.D. of [facility] [place] on 08/14/2011 11:45:09.  This over-read does not include interpretation of cardiac or coronary anatomy or pathology.  The CTA interpretation by the  cardiologist is attached.  Comparison:  None  Findings: No adenopathy in the visualized lower mediastinum or hila.  Minimal dependent atelectasis in the lungs.  There is a small 5 mm nodule in the left lower lobe.  Lungs otherwise clear. No effusions.  Degenerative changes in the thoracic spine.  No acute bony abnormality.  IMPRESSION: 5 mm nodule in the left lower lobe. If the patient is at high risk for bronchogenic carcinoma, follow-up chest CT at 6-12 months is recommended.  If the patient is at low risk for bronchogenic carcinoma, follow-up chest CT at 12 months is recommended.  This recommendation follows the consensus statement: Guidelines for Management of Small Pulmonary Nodules Detected on  CT Scans: A Statement from the Fleischner Society as published in Radiology 2005; 237:395-400.  **END ADDENDUM** SIGNED BY: Aubery Lapping. Kearney Hard, M.D.  Narrative *RADIOLOGY REPORT*  Clinical Data: CARDIAC CTA WITH CALCIUM SCORE 08/13/2011 14:49:00  Ordering Physician: Lesle Reek  Reading Physician: DaltonS.Mclean  Protocol:  The patient scanned on a Philips 256 slice scanner. Prospective gating with 5% phase tolerance at 120 kV was used. Average heart rate during the scan was 60 beats per minute.  After an initial AP and lateral topogram, 3 mm axial slices were performed through the heart for calcium scoring.  The patient then had an 80 ml bolus of contrast given for coronary CTA with bolus tracking in the descending thoracic aorta.  The 3-D data set was then sent to the [employer].  Reconstructions were done using MIP,MPR and VRT modes.  Indications: Assess for CAD  DETAILED FINDINGS:  Quality of Study: Good  Left Main: No plaque or stenosis.  Left Anterior Descending: Prominent mixed plaque in the proximal LAD with probably moderate (50-70%) stenosis.  The first diagonal was a small vessel with calcified plaque at the ostium (could be significant stenosis but  cannot say for sure due to blooming artifact from the calcium).  Left Circumflex: Mixed plaque in the proximal LCx with mild stenosis.  Right Coronary Artery: Mixed plaque in the mid RCA with mild stenosis.  Dominant vessel.  Coronary Calcium Score: 355 Agatston units  IMPRESSION: 1. Prominent mixed plaque in the proximal LAD with moderate (50- 70%) stenosis.  This may be overestimated due to blooming artifact from prominent calcification.  If patient is symptomatic (chest pain), cath may be warranted.  If the patient does not have significant symptoms, this could be medically managed.  2. Calcium score 355 Agatston units.  This actually only places the patient in a moderate risk category because of his age - he is in the 63rd percentile for age and gender.  3. Non-cardiac findings to be reported by [facility].  CT HEAR MORPH WITH CTA COR WITH SCORE WITH CA WITH CONTRAST AND OR WITHOUT CONTRAST  Contrast: 80mL OMNIPAQUE IOHEXOL 350 MG/ML SOLN  Comparison: None.  Findings:  IMPRESSION:  Original Report Authenticated By: Cyndie Chime, M.D.     ______________________________________________________________________________________________      EKG:   EKG Interpretation Date/Time:  Thursday June 05 2023 13:20:20 EDT Ventricular Rate:  82 PR Interval:    QRS Duration:  126 QT Interval:  376 QTC Calculation: 439 R Axis:   -65  Text Interpretation: Atrial fibrillation Left axis deviation Non-specific intra-ventricular conduction block Minimal voltage criteria for LVH, may be normal variant ( Cornell product ) When compared with ECG of 12-May-2023 10:30, No significant change was found Confirmed by Tonny Bollman [phone]) on 06/14/2023 8:29:09 AM    Recent Labs: 06/05/2023: BUN 25; Creatinine, Ser 1.41; Hemoglobin 11.3; Platelets 259; Potassium 4.9; Sodium 137  Recent Lipid Panel    Component Value Date/Time   CHOL 171 01/22/2018 1003   TRIG 98 01/22/2018  1003   TRIG 55 02/24/2006 0946   HDL 57 01/22/2018 1003   CHOLHDL 3.0 01/22/2018 1003   VLDL 20 01/22/2018 1003   LDLCALC 94 01/22/2018 1003   LDLDIRECT 141.6 11/12/2010 1205          Physical Exam:    VS:  BP 118/64 (BP Location: Right Arm, Patient Position: Sitting, Cuff Size: Normal)   Pulse 76   Resp 16   Ht 6\' 2"  (1.88 m)   Wt  222 lb 6.4 oz (100.9 kg)   SpO2 97%   BMI 28.55 kg/m     Wt Readings from Last 3 Encounters:  06/05/23 222 lb 6.4 oz (100.9 kg)  05/28/23 228 lb (103.4 kg)  05/12/23 228 lb 3.2 oz (103.5 kg)     GEN:  Well nourished, well developed in no acute distress HEENT: Normal NECK: No JVD; No carotid bruits LYMPHATICS: No lymphadenopathy CARDIAC: irregularly irregular, 3/6 holosystolic murmur at the RUSB RESPIRATORY:  Clear to auscultation without rales, wheezing or rhonchi  ABDOMEN: Soft, non-tender, non-distended MUSCULOSKELETAL:  No edema; No deformity  SKIN: Warm and dry NEUROLOGIC:  Alert and oriented x 3 PSYCHIATRIC:  Normal affect   Assessment & Plan Nonrheumatic mitral (valve) insufficiency The patient has severe, non-rheumatic mitral regurgitation with NYHA functional class 2 symptoms of chronic diastolic HF. He is currently not symptomatic at his relatively low functional capacity. I have reviewed his TEE images demonstrating severe eccentric MR secondary to restriction of the posterior leaflet. The patient's mitral valve anatomy appears to be amenable to transcatheter edge to edge repair of the mitral valve. While he is not currently limited by cardiopulmonary symptoms, he has been found to have pulmonary hypertension, has progressed to permanent atrial fibrillation, and is requiring higher doses of diuretics. I have reviewed the natural history of mitral regurgitation with the patient today. We have discussed the limitations of medical therapy and the poor prognosis associated with symptomatic mitral regurgitation. We have also reviewed  potential treatment options, including palliative medical therapy, conventional surgical mitral valve repair or replacement, and percutaneous mitral valve therapies such as edge-to-edge mitral valve approximation with MitraClip. We discussed treatment options in the context of this patient's specific comorbid medical conditions. He would not be a candidate for cardiac surgery in the context of his advanced age and comorbid conditions. It seems appropriate to pursue further evaluation for consideration of mitral valve TEER. I have recommended R/L heart cath to assess hemodynamics and coronary anatomy. If he has hemodynamic findings consistent with significant MR, will consider moving forward with TEER. Discussed plan with patient who is in agreement. I have reviewed the risks, indications, and alternatives to cardiac catheterization, possible angioplasty, and stenting with the patient. Risks include but are not limited to bleeding, infection, vascular injury, stroke, myocardial infection, arrhythmia, kidney injury, radiation-related injury in the case of prolonged fluoroscopy use, emergency cardiac surgery, and death. The patient understands the risks of serious complication is 1-2 in 1000 with diagnostic cardiac cath and 1-2% or less with angioplasty/stenting.  Once his cardiac cath is completed, his case will be reviewed by out multidisciplinary heart valve team.  Pre-procedural cardiovascular examination Check pre-cath labs     Informed Consent   Shared Decision Making/Informed Consent The risks [stroke (1 in 1000), death (1 in 1000), kidney failure [usually temporary] (1 in 500), bleeding (1 in 200), allergic reaction [possibly serious] (1 in 200)], benefits (diagnostic support and management of coronary artery disease) and alternatives of a cardiac catheterization were discussed in detail with Mr. Boris Sharper and he is willing to proceed.       Medication Adjustments/Labs and Tests Ordered: Current  medicines are reviewed at length with the patient today.  Concerns regarding medicines are outlined above.  Orders Placed This Encounter  Procedures   CBC   Basic metabolic panel   EKG 12-Lead   No orders of the defined types were placed in this encounter.   Patient Instructions  Lab Work: CBC, BMET today  If you have labs (blood work) drawn today and your tests are completely normal, you will receive your results only by: MyChart Message (if you have MyChart) OR A paper copy in the mail If you have any lab test that is abnormal or we need to change your treatment, we will call you to review the results.  Testing/Procedures: R & Left Heart Catheterization Your physician has requested that you have a cardiac catheterization. Cardiac catheterization is used to diagnose and/or treat various heart conditions. Doctors may recommend this procedure for a number of different reasons. The most common reason is to evaluate chest pain. Chest pain can be a symptom of coronary artery disease (CAD), and cardiac catheterization can show whether plaque is narrowing or blocking your heart's arteries. This procedure is also used to evaluate the valves, as well as measure the blood flow and oxygen levels in different parts of your heart. For further information please visit [email or web address] Please follow instruction sheet, as given.  Follow-Up: At [facility], you and your health needs are our priority.  As part of our continuing mission to provide you with exceptional heart care, we have created designated Provider Care Teams.  These Care Teams include your primary Cardiologist (physician) and Advanced Practice Providers (APPs -  Physician Assistants and Nurse Practitioners) who all work together to provide you with the care you need, when you need it.  Your next appointment:   Structural Team will follow-up  Provider:   Tonny Bollman, MD       Cardiac/Peripheral  Catheterization   You are scheduled for a Cardiac Catheterization on Thursday, March 20 with Dr. Tonny Bollman.  1. Please arrive at the [facility] (Main Entrance A) at [facility]: [address] [place], [place] [address] at 7:00 AM (This time is two hour(s) before your procedure to ensure your preparation).  Free valet parking service is available. You will check in at ADMITTING. The support person will be asked to wait in the waiting room.  It is OK to have someone drop you off and come back when you are ready to be discharged.       Special note: Every effort is made to have your procedure done on time. Please understand that emergencies sometimes delay scheduled procedures. 2. Diet: Do not eat solid foods after midnight.  You may have clear liquids until 5 AM the day of the procedure. 3. Labs: TODAY 4. Medication instructions in preparation for your procedure:  Contrast Allergy: No HOLD ELIQUIS for 2 days prior to procedure and day of (last dose Mon 3/17, resume Fri 3/21) DO NOT TAKE Jardiance, Furosemide, Myrbetriq, Potassium, or Spironolactone the day of procedure DO NOT TAKE Metformin the day of procedure and HOLD for 2 days after (resume Sun 3/23) On the morning of your procedure, take Aspirin 81 mg and any morning medicines NOT listed above.  You may use sips of water. 5. Plan to go home the same day, you will only stay overnight if medically necessary. 6. You MUST have a responsible adult to drive you home. 7. An adult MUST be with you the first 24 hours after you arrive home. 8. Bring a current list of your medications, and the last time and date medication taken. 9. Bring ID and current insurance cards. 10.Please wear clothes that are easy to get on and off and wear slip-on shoes.  Thank you for allowing [place] to care for you!   -- [facility]  Signed, Tonny Bollman, MD  06/14/2023 8:30 AM    [facility]

## 2023-06-05 NOTE — Progress Notes (Signed)
 Cardiology Office Note:    Date:  06/14/2023   ID:  [name] White, DOB 04/24/1935, MRN [id number]  PCP:  Tresa Garter, MD   [facility] Providers Cardiologist:  None Advanced Heart Failure:  Arvilla Meres, MD     Referring MD: Tresa Garter, MD   Chief Complaint  Patient presents with   Mitral Regurgitation    History of Present Illness:    [name] White is a 88 y.o. male referred by Dr. Elwyn Lade for evaluation of mitral regurgitation.  The patient is known to me as he underwent TAVR in 2021.  The patient underwent transesophageal echo in December 2024 for TEE/cardioversion.  This demonstrated severe eccentric mitral regurgitation with a restricted posterior leaflet.  He is referred for consideration of mitral valve TEER.  The patient is here alone today. He's followed for CML at the [facility] and he tells me that he requires iron infusions now. He underwent cardioversion a few months ago but had recurrent AF at follow-up. He reports being primarily limited by musculoskeletal issues and gait unsteadiness. He currently denies shortness of breath, edema, chest pain, orthopnea, or PND. His diuretics were increased and he seems to be feeling better since that time.    Current Medications: Current Meds  Medication Sig   acetaminophen (TYLENOL) 500 MG tablet Take 500 mg by mouth every 6 (six) hours as needed for moderate pain (pain score 4-6).   amLODipine (NORVASC) 10 MG tablet Take 10 mg by mouth daily.   amoxicillin (AMOXIL) 500 MG tablet Take 4 capsules (2,000 mg) one hour prior to all dental visits.   apixaban (ELIQUIS) 5 MG TABS tablet Take 5 mg by mouth 2 (two) times daily.   cholecalciferol (VITAMIN D3) 25 MCG (1000 UNIT) tablet Take 1,000 Units by mouth at bedtime.   CINNAMON PO Take 2,000 mg by mouth 2 (two) times daily.    Coenzyme Q10 ([employer] COQ-10) 200 MG capsule Take 200 mg by mouth every evening.    empagliflozin (JARDIANCE) 25 MG TABS tablet Take 25  mg by mouth daily.   finasteride (PROSCAR) 5 MG tablet Take 5 mg by mouth every evening.   furosemide (LASIX) 20 MG tablet TAKE 2 TABLETS (40 MG TOTAL) BY MOUTH DAILY.   latanoprost (XALATAN) 0.005 % ophthalmic solution Place 1 drop into both eyes at bedtime.   levothyroxine (SYNTHROID) 200 MCG tablet Take 200 mcg by mouth daily before breakfast.   Melatonin 10 MG CAPS Take 10 mg by mouth at bedtime as needed (sleep).   metFORMIN (GLUCOPHAGE-XR) 500 MG 24 hr tablet Take 500 mg by mouth 2 (two) times daily.   mirabegron ER (MYRBETRIQ) 50 MG TB24 tablet Take 50 mg by mouth daily.   nilotinib (TASIGNA) 150 MG capsule Take 150 mg by mouth 2 (two) times daily. Take on an empty stomach, 1 hr before or 2 hrs after food.   PARoxetine (PAXIL) 40 MG tablet Take 1 tablet (40 mg total) by mouth every morning.   potassium chloride SA (KLOR-CON M20) 20 MEQ tablet TAKE 1 TABLET BY MOUTH EVERY DAY   rosuvastatin (CRESTOR) 10 MG tablet TAKE 1 TABLET (10 MG TOTAL) BY MOUTH DAILY. PLEASE SCHEDULE AN APPOINTMENT FOR FURTHER REFILLS   spironolactone (ALDACTONE) 25 MG tablet Take 25 mg by mouth daily.   timolol (BETIMOL) 0.5 % ophthalmic solution Place 1 drop into both eyes daily.   traZODone (DESYREL) 50 MG tablet Take 50 mg by mouth at bedtime.  TURMERIC PO Take 1,000 mg by mouth every evening.    [DISCONTINUED] ferrous sulfate 325 (65 FE) MG tablet Take 325 mg by mouth every Monday, Wednesday, and Friday.     Allergies:   Ace inhibitors, Benazepril, Codeine, Statins, Piroxicam, Pseudoephedrine, Brinzolamide-brimonidine, Dasatinib, and Betadine [povidone iodine]   ROS:   Please see the history of present illness.    All other systems reviewed and are negative.  EKGs/Labs/Other Studies Reviewed:    The following studies were reviewed today: Cardiac Studies & Procedures   ______________________________________________________________________________________________ CARDIAC CATHETERIZATION  CARDIAC  CATHETERIZATION 03/18/2019  Narrative  Prox RCA to Mid RCA lesion is 30% stenosed.  RPAV lesion is 30% stenosed.  Ost Cx to Prox Cx lesion is 50% stenosed.  Prox LAD to Mid LAD lesion is 20% stenosed.  Findings:  Ao = 82/51(65) Lv = 138/8 RA = 5 RV = 38/6 PA = 37/13 (22) PCW = 14 Fick cardiac output/index = 9.8/4.3 PVR = 0.7 WU FA sat = 96% PA sat = 78%, 77% High SVC 75%  AoV = peak gradient 51mm HG, mean gradient 36 mmHG, AVA 1.4 cm2  Assessment:  1. Mild non-obstructive CAD 2. Moderate to severe AS 3.  Normal LV function by echo  Plan/Discussion:  Refer for TAVR eval.  Arvilla Meres, MD 3:17 PM  Findings Coronary Findings Diagnostic  Dominance: Right  Left Anterior Descending Prox LAD to Mid LAD lesion is 20% stenosed.  Left Circumflex Ost Cx to Prox Cx lesion is 50% stenosed.  Right Coronary Artery Prox RCA to Mid RCA lesion is 30% stenosed.  Right Posterior Atrioventricular Artery RPAV lesion is 30% stenosed.  Intervention  No interventions have been documented.   STRESS TESTS  NM MYOCAR MULTI W/SPECT W 07/24/2005   ECHOCARDIOGRAM  ECHOCARDIOGRAM COMPLETE 03/25/2023  Narrative ECHOCARDIOGRAM REPORT    Patient Name:   [name] White Date of Exam: 03/25/2023 Medical Rec #:  [id number]        Height:       74.0 in Accession #:    [id number]       Weight:       225.0 lb Date of Birth:  1935-06-17        BSA:          2.285 m Patient Age:    87 years         BP:           120/76 mmHg Patient Gender: M                HR:           86 bpm. Exam Location:  Outpatient  Procedure: 2D Echo, Color Doppler and Cardiac Doppler  Indications:    I50.9 Congestive Heart Disease  History:        Patient has prior history of Echocardiogram examinations. CHF, CAD, 26mm Edwards TAVR (01/21 implanted), Emphysema, Arrythmias:Atrial Flutter; Risk Factors:Hypertension. Aortic Valve: 26 mm Edwards Sapien prosthetic, stented (TAVR) valve is  present in the aortic position.  Sonographer:    L. Thornton-Maynard, RDCS Referring Phys: [id number] Romie Minus   Sonographer Comments: Global longitudinal strain was attempted. IMPRESSIONS   1. Left ventricular ejection fraction, by estimation, is 55 to 60%. The left ventricle has normal function. The left ventricle has no regional wall motion abnormalities. There is mild left ventricular hypertrophy. Left ventricular diastolic parameters are indeterminate. 2. Right ventricular systolic function is mildly reduced. The right ventricular size is normal. There is severely  elevated pulmonary artery systolic pressure. The estimated right ventricular systolic pressure is 65.2 mmHg. 3. Left atrial size was severely dilated. 4. Right atrial size was severely dilated. 5. Tricuspid valve regurgitation is moderate. 6. The aortic valve has been repaired/replaced. Aortic valve regurgitation is not visualized. There is a 26 mm Edwards Sapien prosthetic (TAVR) valve present in the aortic position. Vmax 2.1 m/s, MG , EOA 1.1 cm^2, DI 0.48 7. The inferior vena cava is dilated in size with >50% respiratory variability, suggesting right atrial pressure of 8 mmHg. 8. The mitral valve is abnormal. Moderate to severe mitral valve regurgitation. Mild mitral stenosis. The mean mitral valve gradient is 5.0 mmHg with average heart rate of 74 bpm. Eccentric posterior directed MR jet, difficult to quantify given eccentric jet, but suspect severe MR. Recommend TEE or cardiac MRI for further evaluation  FINDINGS Left Ventricle: Left ventricular ejection fraction, by estimation, is 55 to 60%. The left ventricle has normal function. The left ventricle has no regional wall motion abnormalities. The left ventricular internal cavity size was normal in size. There is mild left ventricular hypertrophy. Left ventricular diastolic parameters are indeterminate.  Right Ventricle: The right ventricular size is normal. No  increase in right ventricular wall thickness. Right ventricular systolic function is mildly reduced. There is severely elevated pulmonary artery systolic pressure. The tricuspid regurgitant velocity is 3.78 m/s, and with an assumed right atrial pressure of 8 mmHg, the estimated right ventricular systolic pressure is 65.2 mmHg.  Left Atrium: Left atrial size was severely dilated.  Right Atrium: Right atrial size was severely dilated.  Pericardium: There is no evidence of pericardial effusion.  Mitral Valve: The mitral valve is abnormal. Moderate to severe mitral valve regurgitation. Mild mitral valve stenosis. MV peak gradient, 12.4 mmHg. The mean mitral valve gradient is 5.0 mmHg with average heart rate of 74 bpm.  Tricuspid Valve: The tricuspid valve is normal in structure. Tricuspid valve regurgitation is moderate.  Aortic Valve: The aortic valve has been repaired/replaced. Aortic valve regurgitation is trivial. Aortic valve mean gradient measures 9.5 mmHg. Aortic valve peak gradient measures 17.1 mmHg. Aortic valve area, by VTI measures 1.13 cm. There is a 26 mm Edwards Sapien prosthetic, stented (TAVR) valve present in the aortic position.  Pulmonic Valve: The pulmonic valve was not well visualized. Pulmonic valve regurgitation is trivial.  Aorta: The aortic root and ascending aorta are structurally normal, with no evidence of dilitation.  Venous: The inferior vena cava is dilated in size with greater than 50% respiratory variability, suggesting right atrial pressure of 8 mmHg.  IAS/Shunts: The interatrial septum was not well visualized.   LEFT VENTRICLE PLAX 2D LVIDd:         4.60 cm LVIDs:         3.40 cm LV PW:         1.20 cm LV IVS:        1.10 cm LVOT diam:     1.70 cm LV SV:         47 LV SV Index:   21 LVOT Area:     2.27 cm  LV Volumes (MOD) LV vol d, MOD A2C: 126.0 ml LV vol d, MOD A4C: 118.0 ml LV vol s, MOD A2C: 56.8 ml LV vol s, MOD A4C: 42.5 ml LV SV MOD  A2C:     69.2 ml LV SV MOD A4C:     118.0 ml LV SV MOD BP:      73.9 ml  RIGHT VENTRICLE  IVC RV Basal diam:  3.80 cm  IVC diam: 2.50 cm TAPSE (M-mode): 1.3 cm  LEFT ATRIUM              Index        RIGHT ATRIUM           Index LA diam:        5.20 cm  2.28 cm/m   RA Area:     27.25 cm LA Vol (A2C):   141.0 ml 61.71 ml/m  RA Volume:   91.80 ml  40.17 ml/m LA Vol (A4C):   136.0 ml 59.52 ml/m LA Biplane Vol: 142.0 ml 62.14 ml/m AORTIC VALVE                     PULMONIC VALVE AV Area (Vmax):    1.11 cm      PV Vmax:          0.88 m/s AV Area (Vmean):   1.08 cm      PV Peak grad:     3.1 mmHg AV Area (VTI):     1.13 cm      PR End Diast Vel: 6.97 msec AV Vmax:           207.00 cm/s AV Vmean:          141.500 cm/s AV VTI:            0.418 m AV Peak Grad:      17.1 mmHg AV Mean Grad:      9.5 mmHg LVOT Vmax:         101.25 cm/s LVOT Vmean:        67.350 cm/s LVOT VTI:          0.207 m LVOT/AV VTI ratio: 0.50  AORTA Ao Root diam: 3.00 cm Ao Asc diam:  3.30 cm Ao Arch diam: 3.1 cm  MITRAL VALVE                  TRICUSPID VALVE MV Area (PHT): 2.87 cm       TR Peak grad:   57.2 mmHg MV Area VTI:   1.19 cm       TR Vmax:        378.00 cm/s MV Peak grad:  12.4 mmHg MV Mean grad:  5.0 mmHg       SHUNTS MV Vmax:       1.76 m/s       Systemic VTI:  0.21 m MV Vmean:      104.0 cm/s     Systemic Diam: 1.70 cm MV Decel Time: 264 msec MR Peak grad:    122.3 mmHg MR Mean grad:    79.0 mmHg MR Vmax:         553.00 cm/s MR Vmean:        422.0 cm/s MR PISA:         14.14 cm MR PISA Eff ROA: 103 mm MR PISA Radius:  1.50 cm MV E velocity: 141.00 cm/s  Epifanio Lesches MD Electronically signed by Epifanio Lesches MD Signature Date/Time: 03/25/2023/2:10:16 PM    Final   TEE  ECHO TEE 04/07/2023  Narrative TRANSESOPHOGEAL ECHO REPORT    Patient Name:   [name] White Date of Exam: 04/07/2023 Medical Rec #:  [id number]        Height:       74.0  in Accession #:    [id number]       Weight:  237.6 lb Date of Birth:  September 04, 1935        BSA:          2.339 m Patient Age:    87 years         BP:           129/87 mmHg Patient Gender: M                HR:           72 bpm. Exam Location:  Inpatient  Procedure: Transesophageal Echo, 3D Echo, Color Doppler and Cardiac Doppler  Indications:     Mitral Regurgitation i34.0  History:         Patient has prior history of Echocardiogram examinations, most recent 03/25/2023. Arrythmias:Atrial Fibrillation; Risk Factors:Hypertension, Diabetes, Dyslipidemia and Sleep Apnea. Aortic Valve: 26 mm Edwards Sapien prosthetic, stented (TAVR) valve is present in the aortic position. Procedure Date: 04/13/19.  Sonographer:     Irving Burton Senior RDCS Referring Phys:  [id number] Romie Minus Diagnosing Phys: Clearnce Hasten  PROCEDURE: After discussion of the risks and benefits of a TEE, an informed consent was obtained from the patient. The transesophogeal probe was passed without difficulty through the esophogus of the patient. Sedation performed by different physician. The patient was monitored while under deep sedation. Anesthestetic sedation was provided intravenously by Anesthesiology: 501mg  of Propofol, 160mg  of Lidocaine. The patient developed no complications during the procedure. A successful direct current cardioversion was performed at 200 joules with 1 attempt.  IMPRESSIONS   1. Left ventricular ejection fraction, by estimation, is 50 to 55%. The left ventricle has low normal function. 2. RVSP mildly elevated but likely underestimates true PA pressures.. Right ventricular systolic function is normal. The right ventricular size is normal. There is mildly elevated pulmonary artery systolic pressure. 3. Left atrial size was moderately dilated. No left atrial/left atrial appendage thrombus was detected. 4. Right atrial size was mildly dilated. 5. Severe mitral regurgitation with reversal of  flow in the pulmonary veins. MVA 6.1 cm2. EROA 0.75cm2, MR volume . Severely restricted posterior leaflet measured at around 1cm. Will refer for assessment of mTEER.. The mitral valve is abnormal. Severe mitral valve regurgitation. No evidence of mitral stenosis. The mean mitral valve gradient is 3.0 mmHg. 6. Tricuspid valve regurgitation is mild to moderate. 7. The aortic valve has been repaired/replaced. Aortic valve regurgitation is not visualized. There is a 26 mm Edwards Sapien prosthetic (TAVR) valve present in the aortic position. Procedure Date: 04/13/19. Echo findings are consistent with normal structure and function of the aortic valve prosthesis. Aortic valve mean gradient measures 10.0 mmHg. Aortic valve Vmax measures 2.14 m/s. 8. There is mild (Grade II) plaque. 9. The left upper pulmonary vein and right upper pulmonary vein are abnormal.  Conclusion(s)/Recommendation(s): No LA/LAA thrombus identified. Successful cardioversion performed with restoration of normal sinus rhythm.  FINDINGS Left Ventricle: Left ventricular ejection fraction, by estimation, is 50 to 55%. The left ventricle has low normal function. The left ventricular internal cavity size was normal in size.  Right Ventricle: RVSP mildly elevated but likely underestimates true PA pressures. The right ventricular size is normal. No increase in right ventricular wall thickness. Right ventricular systolic function is normal. There is mildly elevated pulmonary artery systolic pressure.  Left Atrium: Left atrial size was moderately dilated. No left atrial/left atrial appendage thrombus was detected.  Right Atrium: Right atrial size was mildly dilated.  Pericardium: There is no evidence of pericardial effusion.  Mitral Valve: Severe mitral regurgitation  with reversal of flow in the pulmonary veins. MVA 6.1 cm2. EROA 0.75cm2, MR volume . Severely restricted posterior leaflet measured at around 1cm. Will refer for  assessment of mTEER. The mitral valve is abnormal. There is mild thickening of the mitral valve leaflet(s). Mild mitral annular calcification. Severe mitral valve regurgitation, with wall-impinging jet. No evidence of mitral valve stenosis. MV peak gradient, 8.6 mmHg. The mean mitral valve gradient is 3.0 mmHg.  Tricuspid Valve: The tricuspid valve is normal in structure. Tricuspid valve regurgitation is mild to moderate.  Aortic Valve: The aortic valve has been repaired/replaced. Aortic valve regurgitation is not visualized. Aortic valve mean gradient measures 10.0 mmHg. Aortic valve peak gradient measures 18.3 mmHg. There is a 26 mm Edwards Sapien prosthetic, stented (TAVR) valve present in the aortic position. Procedure Date: 04/13/19. Echo findings are consistent with normal structure and function of the aortic valve prosthesis.  Pulmonic Valve: The pulmonic valve was normal in structure. Pulmonic valve regurgitation is not visualized.  Aorta: The aortic root, ascending aorta and aortic arch are all structurally normal, with no evidence of dilitation or obstruction. There is mild (Grade II) plaque.  Venous: The left upper pulmonary vein and right upper pulmonary vein are abnormal. A pattern of systolic flow reversal, suggestive of severe mitral regurgitation is recorded from the left upper pulmonary vein and the right upper pulmonary vein.  IAS/Shunts: No atrial level shunt detected by color flow Doppler.  Additional Comments: Spectral Doppler performed.  AORTIC VALVE AV Vmax:           214.00 cm/s AV Vmean:          147.000 cm/s AV VTI:            0.377 m AV Peak Grad:      18.3 mmHg AV Mean Grad:      10.0 mmHg LVOT Vmax:         100.00 cm/s LVOT Vmean:        73.800 cm/s LVOT VTI:          0.214 m LVOT/AV VTI ratio: 0.57  MITRAL VALVE                  TRICUSPID VALVE MV Peak grad: 8.6 mmHg        TR Peak grad:   35.5 mmHg MV Mean grad: 3.0 mmHg        TR Vmax:        298.00  cm/s MV Vmax:      1.47 m/s MV Vmean:     73.6 cm/s       SHUNTS MR Peak grad:    102.8 mmHg   Systemic VTI: 0.21 m MR Mean grad:    56.0 mmHg MR Vmax:         507.00 cm/s MR Vmean:        349.0 cm/s MR PISA:         10.62 cm MR PISA Eff ROA: 75 mm MR PISA Radius:  1.30 cm  Clearnce Hasten Electronically signed by Clearnce Hasten Signature Date/Time: 04/14/2023/8:49:13 AM    Final    CT SCANS  CT CORONARY MORPH W/CTA COR W/SCORE 03/31/2019  Addendum 04/01/2019 10:12 PM ADDENDUM REPORT: 04/01/2019 22:09  CLINICAL DATA:  88 year old male with severe aortic stenosis being evaluated for a TAVR procedure.  EXAM: Cardiac TAVR CT  TECHNIQUE: The patient was scanned on a [employer]. A 120 kV retrospective scan was triggered in the descending thoracic aorta at 111  HU's. Gantry rotation speed was 250 msecs and collimation was .6 mm. No beta blockade or nitro were given. The 3D data set was reconstructed in 5% intervals of the R-R cycle. Systolic and diastolic phases were analyzed on a dedicated work station using MPR, MIP and VRT modes. The patient received 80 cc of contrast.  FINDINGS: Aortic Valve: Trileaflet aortic valve with severely thickened and calcified leaflets and no calcifications extending into the LVOT.  Aorta: Mild dilatation of the ascending aorta measuring 44 x 43 mm. Mild diffuse atherosclerotic plaque and calcifications. No dissection.  Sinotubular Junction: 31 x 30 mm  Ascending Thoracic Aorta: 36 x 36 mm  Aortic Arch: 28 x 28 mm  Descending Thoracic Aorta: 28 x 28 mm  Sinus of Valsalva Measurements:  Non-coronary: 35 mm  Right -coronary: 33 mm  Left -coronary: 36 mm  Coronary Artery Height above Annulus:  Left Main: 14 mm  Right Coronary: 18 mm  Virtual Basal Annulus Measurements:  Maximum/Minimum Diameter: 27.8 x 24.7 mm  Mean Diameter: 25.4 mm  Perimeter: 81.2 mm  Area: 507 mm2  Optimum Fluoroscopic Angle for  Delivery: LAO 5 CAU 5  IMPRESSION: 1. Trileaflet aortic valve with severely thickened and calcified leaflets and no calcifications extending into the LVOT. Aortic valve calcium score is 2881 consistent with severe aortic stenosis. Annular measurements are suitable for delivery of a 26 mm Edwards-SAPIEN 3 Ultra valve.  2. Sufficient coronary to annulus distance.  3. Optimum Fluoroscopic Angle for Delivery: LAO 5 CAU 5  4. There is a small filling defect in the most superior portion of the left atrial appendage that gets smaller on the delayed images and most probably represents poor contrast mixing.   Electronically Signed By: Tobias Alexander On: 04/01/2019 22:09  Narrative EXAM: OVER-READ INTERPRETATION  CT CHEST  The following report is an over-read performed by radiologist Dr. Trudie Reed of [facility] on 03/31/2019. This over-read does not include interpretation of cardiac or coronary anatomy or pathology. The coronary calcium score/coronary CTA interpretation by the cardiologist is attached.  COMPARISON:  Cardiac CTA 08/13/2011.  FINDINGS: Extracardiac findings were described separately under dictation for contemporaneously obtained CTA chest, abdomen and pelvis.  IMPRESSION: Please see separate dictation for contemporaneously obtained CTA chest, abdomen and pelvis dated 03/31/2019 for full description of relevant extracardiac findings.  Electronically Signed: By: Trudie Reed M.D. On: 03/31/2019 13:12   CT SCANS  CT CORONARY MORPH W/CTA COR W/SCORE 08/13/2011  Addendum 08/14/2011 11:50 AM **ADDENDUM** CREATED: 08/14/2011 11:45:09  OVER-READ INTERPRETATION - CT CHEST  The following report is an over-read performed by radiologist Dr. Aubery Lapping. Kearney Hard, M.D. of [facility] [place] on 08/14/2011 11:45:09.  This over-read does not include interpretation of cardiac or coronary anatomy or pathology.  The CTA interpretation by the  cardiologist is attached.  Comparison:  None  Findings: No adenopathy in the visualized lower mediastinum or hila.  Minimal dependent atelectasis in the lungs.  There is a small 5 mm nodule in the left lower lobe.  Lungs otherwise clear. No effusions.  Degenerative changes in the thoracic spine.  No acute bony abnormality.  IMPRESSION: 5 mm nodule in the left lower lobe. If the patient is at high risk for bronchogenic carcinoma, follow-up chest CT at 6-12 months is recommended.  If the patient is at low risk for bronchogenic carcinoma, follow-up chest CT at 12 months is recommended.  This recommendation follows the consensus statement: Guidelines for Management of Small Pulmonary Nodules Detected on  CT Scans: A Statement from the Fleischner Society as published in Radiology 2005; 237:395-400.  **END ADDENDUM** SIGNED BY: Aubery Lapping. Kearney Hard, M.D.  Narrative *RADIOLOGY REPORT*  Clinical Data: CARDIAC CTA WITH CALCIUM SCORE 08/13/2011 14:49:00  Ordering Physician: Lesle Reek  Reading Physician: DaltonS.Mclean  Protocol:  The patient scanned on a Philips 256 slice scanner. Prospective gating with 5% phase tolerance at 120 kV was used. Average heart rate during the scan was 60 beats per minute.  After an initial AP and lateral topogram, 3 mm axial slices were performed through the heart for calcium scoring.  The patient then had an 80 ml bolus of contrast given for coronary CTA with bolus tracking in the descending thoracic aorta.  The 3-D data set was then sent to the [employer].  Reconstructions were done using MIP,MPR and VRT modes.  Indications: Assess for CAD  DETAILED FINDINGS:  Quality of Study: Good  Left Main: No plaque or stenosis.  Left Anterior Descending: Prominent mixed plaque in the proximal LAD with probably moderate (50-70%) stenosis.  The first diagonal was a small vessel with calcified plaque at the ostium (could be significant stenosis but  cannot say for sure due to blooming artifact from the calcium).  Left Circumflex: Mixed plaque in the proximal LCx with mild stenosis.  Right Coronary Artery: Mixed plaque in the mid RCA with mild stenosis.  Dominant vessel.  Coronary Calcium Score: 355 Agatston units  IMPRESSION: 1. Prominent mixed plaque in the proximal LAD with moderate (50- 70%) stenosis.  This may be overestimated due to blooming artifact from prominent calcification.  If patient is symptomatic (chest pain), cath may be warranted.  If the patient does not have significant symptoms, this could be medically managed.  2. Calcium score 355 Agatston units.  This actually only places the patient in a moderate risk category because of his age - he is in the 63rd percentile for age and gender.  3. Non-cardiac findings to be reported by [facility].  CT HEAR MORPH WITH CTA COR WITH SCORE WITH CA WITH CONTRAST AND OR WITHOUT CONTRAST  Contrast: 80mL OMNIPAQUE IOHEXOL 350 MG/ML SOLN  Comparison: None.  Findings:  IMPRESSION:  Original Report Authenticated By: Cyndie Chime, M.D.     ______________________________________________________________________________________________      EKG:   EKG Interpretation Date/Time:  Thursday June 05 2023 13:20:20 EDT Ventricular Rate:  82 PR Interval:    QRS Duration:  126 QT Interval:  376 QTC Calculation: 439 R Axis:   -65  Text Interpretation: Atrial fibrillation Left axis deviation Non-specific intra-ventricular conduction block Minimal voltage criteria for LVH, may be normal variant ( Cornell product ) When compared with ECG of 12-May-2023 10:30, No significant change was found Confirmed by Tonny Bollman [phone]) on 06/14/2023 8:29:09 AM    Recent Labs: 06/05/2023: BUN 25; Creatinine, Ser 1.41; Hemoglobin 11.3; Platelets 259; Potassium 4.9; Sodium 137  Recent Lipid Panel    Component Value Date/Time   CHOL 171 01/22/2018 1003   TRIG 98 01/22/2018  1003   TRIG 55 02/24/2006 0946   HDL 57 01/22/2018 1003   CHOLHDL 3.0 01/22/2018 1003   VLDL 20 01/22/2018 1003   LDLCALC 94 01/22/2018 1003   LDLDIRECT 141.6 11/12/2010 1205          Physical Exam:    VS:  BP 118/64 (BP Location: Right Arm, Patient Position: Sitting, Cuff Size: Normal)   Pulse 76   Resp 16   Ht 6\' 2"  (1.88 m)   Wt  222 lb 6.4 oz (100.9 kg)   SpO2 97%   BMI 28.55 kg/m     Wt Readings from Last 3 Encounters:  06/05/23 222 lb 6.4 oz (100.9 kg)  05/28/23 228 lb (103.4 kg)  05/12/23 228 lb 3.2 oz (103.5 kg)     GEN:  Well nourished, well developed in no acute distress HEENT: Normal NECK: No JVD; No carotid bruits LYMPHATICS: No lymphadenopathy CARDIAC: irregularly irregular, 3/6 holosystolic murmur at the RUSB RESPIRATORY:  Clear to auscultation without rales, wheezing or rhonchi  ABDOMEN: Soft, non-tender, non-distended MUSCULOSKELETAL:  No edema; No deformity  SKIN: Warm and dry NEUROLOGIC:  Alert and oriented x 3 PSYCHIATRIC:  Normal affect   Assessment & Plan Nonrheumatic mitral (valve) insufficiency The patient has severe, non-rheumatic mitral regurgitation with NYHA functional class 2 symptoms of chronic diastolic HF. He is currently not symptomatic at his relatively low functional capacity. I have reviewed his TEE images demonstrating severe eccentric MR secondary to restriction of the posterior leaflet. The patient's mitral valve anatomy appears to be amenable to transcatheter edge to edge repair of the mitral valve. While he is not currently limited by cardiopulmonary symptoms, he has been found to have pulmonary hypertension, has progressed to permanent atrial fibrillation, and is requiring higher doses of diuretics. I have reviewed the natural history of mitral regurgitation with the patient today. We have discussed the limitations of medical therapy and the poor prognosis associated with symptomatic mitral regurgitation. We have also reviewed  potential treatment options, including palliative medical therapy, conventional surgical mitral valve repair or replacement, and percutaneous mitral valve therapies such as edge-to-edge mitral valve approximation with MitraClip. We discussed treatment options in the context of this patient's specific comorbid medical conditions. He would not be a candidate for cardiac surgery in the context of his advanced age and comorbid conditions. It seems appropriate to pursue further evaluation for consideration of mitral valve TEER. I have recommended R/L heart cath to assess hemodynamics and coronary anatomy. If he has hemodynamic findings consistent with significant MR, will consider moving forward with TEER. Discussed plan with patient who is in agreement. I have reviewed the risks, indications, and alternatives to cardiac catheterization, possible angioplasty, and stenting with the patient. Risks include but are not limited to bleeding, infection, vascular injury, stroke, myocardial infection, arrhythmia, kidney injury, radiation-related injury in the case of prolonged fluoroscopy use, emergency cardiac surgery, and death. The patient understands the risks of serious complication is 1-2 in 1000 with diagnostic cardiac cath and 1-2% or less with angioplasty/stenting.  Once his cardiac cath is completed, his case will be reviewed by out multidisciplinary heart valve team.  Pre-procedural cardiovascular examination Check pre-cath labs     Informed Consent   Shared Decision Making/Informed Consent The risks [stroke (1 in 1000), death (1 in 1000), kidney failure [usually temporary] (1 in 500), bleeding (1 in 200), allergic reaction [possibly serious] (1 in 200)], benefits (diagnostic support and management of coronary artery disease) and alternatives of a cardiac catheterization were discussed in detail with Mr. Boris Sharper and he is willing to proceed.       Medication Adjustments/Labs and Tests Ordered: Current  medicines are reviewed at length with the patient today.  Concerns regarding medicines are outlined above.  Orders Placed This Encounter  Procedures   CBC   Basic metabolic panel   EKG 12-Lead   No orders of the defined types were placed in this encounter.   Patient Instructions  Lab Work: CBC, BMET today  If you have labs (blood work) drawn today and your tests are completely normal, you will receive your results only by: MyChart Message (if you have MyChart) OR A paper copy in the mail If you have any lab test that is abnormal or we need to change your treatment, we will call you to review the results.  Testing/Procedures: R & Left Heart Catheterization Your physician has requested that you have a cardiac catheterization. Cardiac catheterization is used to diagnose and/or treat various heart conditions. Doctors may recommend this procedure for a number of different reasons. The most common reason is to evaluate chest pain. Chest pain can be a symptom of coronary artery disease (CAD), and cardiac catheterization can show whether plaque is narrowing or blocking your heart's arteries. This procedure is also used to evaluate the valves, as well as measure the blood flow and oxygen levels in different parts of your heart. For further information please visit [email or web address] Please follow instruction sheet, as given.  Follow-Up: At [facility], you and your health needs are our priority.  As part of our continuing mission to provide you with exceptional heart care, we have created designated Provider Care Teams.  These Care Teams include your primary Cardiologist (physician) and Advanced Practice Providers (APPs -  Physician Assistants and Nurse Practitioners) who all work together to provide you with the care you need, when you need it.  Your next appointment:   Structural Team will follow-up  Provider:   Tonny Bollman, MD       Cardiac/Peripheral  Catheterization   You are scheduled for a Cardiac Catheterization on Thursday, March 20 with Dr. Tonny Bollman.  1. Please arrive at the [facility] (Main Entrance A) at [facility]: [address] [place], [place] [address] at 7:00 AM (This time is two hour(s) before your procedure to ensure your preparation).  Free valet parking service is available. You will check in at ADMITTING. The support person will be asked to wait in the waiting room.  It is OK to have someone drop you off and come back when you are ready to be discharged.       Special note: Every effort is made to have your procedure done on time. Please understand that emergencies sometimes delay scheduled procedures. 2. Diet: Do not eat solid foods after midnight.  You may have clear liquids until 5 AM the day of the procedure. 3. Labs: TODAY 4. Medication instructions in preparation for your procedure:  Contrast Allergy: No HOLD ELIQUIS for 2 days prior to procedure and day of (last dose Mon 3/17, resume Fri 3/21) DO NOT TAKE Jardiance, Furosemide, Myrbetriq, Potassium, or Spironolactone the day of procedure DO NOT TAKE Metformin the day of procedure and HOLD for 2 days after (resume Sun 3/23) On the morning of your procedure, take Aspirin 81 mg and any morning medicines NOT listed above.  You may use sips of water. 5. Plan to go home the same day, you will only stay overnight if medically necessary. 6. You MUST have a responsible adult to drive you home. 7. An adult MUST be with you the first 24 hours after you arrive home. 8. Bring a current list of your medications, and the last time and date medication taken. 9. Bring ID and current insurance cards. 10.Please wear clothes that are easy to get on and off and wear slip-on shoes.  Thank you for allowing [place] to care for you!   -- [facility]  Signed, Tonny Bollman, MD  06/14/2023 8:30 AM    [facility]

## 2023-06-05 NOTE — Patient Instructions (Signed)
 Lab Work: CBC, BMET today  If you have labs (blood work) drawn today and your tests are completely normal, you will receive your results only by: MyChart Message (if you have MyChart) OR A paper copy in the mail If you have any lab test that is abnormal or we need to change your treatment, we will call you to review the results.  Testing/Procedures: R & Left Heart Catheterization Your physician has requested that you have a cardiac catheterization. Cardiac catheterization is used to diagnose and/or treat various heart conditions. Doctors may recommend this procedure for a number of different reasons. The most common reason is to evaluate chest pain. Chest pain can be a symptom of coronary artery disease (CAD), and cardiac catheterization can show whether plaque is narrowing or blocking your heart's arteries. This procedure is also used to evaluate the valves, as well as measure the blood flow and oxygen levels in different parts of your heart. For further information please visit [email or web address] Please follow instruction sheet, as given.  Follow-Up: At [facility], you and your health needs are our priority.  As part of our continuing mission to provide you with exceptional heart care, we have created designated Provider Care Teams.  These Care Teams include your primary Cardiologist (physician) and Advanced Practice Providers (APPs -  Physician Assistants and Nurse Practitioners) who all work together to provide you with the care you need, when you need it.  Your next appointment:   Structural Team will follow-up  Provider:   Tonny Bollman, MD       Cardiac/Peripheral Catheterization   You are scheduled for a Cardiac Catheterization on Thursday, March 20 with Dr. Tonny Bollman.  1. Please arrive at the [facility] (Main Entrance A) at [facility]: [address] [place], [place] [address] at 7:00 AM (This time is two hour(s) before your procedure to ensure your  preparation).  Free valet parking service is available. You will check in at ADMITTING. The support person will be asked to wait in the waiting room.  It is OK to have someone drop you off and come back when you are ready to be discharged.       Special note: Every effort is made to have your procedure done on time. Please understand that emergencies sometimes delay scheduled procedures. 2. Diet: Do not eat solid foods after midnight.  You may have clear liquids until 5 AM the day of the procedure. 3. Labs: TODAY 4. Medication instructions in preparation for your procedure:  Contrast Allergy: No HOLD ELIQUIS for 2 days prior to procedure and day of (last dose Mon 3/17, resume Fri 3/21) DO NOT TAKE Jardiance, Furosemide, Myrbetriq, Potassium, or Spironolactone the day of procedure DO NOT TAKE Metformin the day of procedure and HOLD for 2 days after (resume Sun 3/23) On the morning of your procedure, take Aspirin 81 mg and any morning medicines NOT listed above.  You may use sips of water. 5. Plan to go home the same day, you will only stay overnight if medically necessary. 6. You MUST have a responsible adult to drive you home. 7. An adult MUST be with you the first 24 hours after you arrive home. 8. Bring a current list of your medications, and the last time and date medication taken. 9. Bring ID and current insurance cards. 10.Please wear clothes that are easy to get on and off and wear slip-on shoes.  Thank you for allowing [place] to care for you!   --  [place]

## 2023-06-06 LAB — BASIC METABOLIC PANEL
BUN/Creatinine Ratio: 18 (ref 10–24)
BUN: 25 mg/dL (ref 8–27)
CO2: 22 mmol/L (ref 20–29)
Calcium: 9.8 mg/dL (ref 8.6–10.2)
Chloride: 100 mmol/L (ref 96–106)
Creatinine, Ser: 1.41 mg/dL — ABNORMAL HIGH (ref 0.76–1.27)
Glucose: 134 mg/dL — ABNORMAL HIGH (ref 70–99)
Potassium: 4.9 mmol/L (ref 3.5–5.2)
Sodium: 137 mmol/L (ref 134–144)
eGFR: 49 mL/min/{1.73_m2} — ABNORMAL LOW (ref >59)

## 2023-06-06 LAB — CBC
Hematocrit: 36.7 % — ABNORMAL LOW (ref 37.5–51.0)
Hemoglobin: 11.3 g/dL — ABNORMAL LOW (ref 13.0–17.7)
MCH: 25.3 pg — ABNORMAL LOW (ref 26.6–33.0)
MCHC: 30.8 g/dL — ABNORMAL LOW (ref 31.5–35.7)
MCV: 82 fL (ref 79–97)
Platelets: 259 10*3/uL (ref 150–450)
RBC: 4.47 x10E6/uL (ref 4.14–5.80)
RDW: 15.4 % (ref 11.6–15.4)
WBC: 6.7 10*3/uL (ref 3.4–10.8)

## 2023-06-17 ENCOUNTER — Telehealth: Payer: Self-pay | Admitting: *Deleted

## 2023-06-17 NOTE — Telephone Encounter (Signed)
 Cardiac Catheterization scheduled at [facility] for: Thursday June 19, 2023 9 AM Arrival time [facility] Main Entrance A at: 7 AM  Nothing to eat after midnight prior to procedure, clear liquids until 5 AM day of procedure.  Medication instructions: -Hold:  Eliquis-none 06/17/23 until post procedure  Metformin-day of procedure and 48 hours post procedure  Jardiance-AM of procedure  Spironolactone/Lasix/KCl-day before and day of procedure-per protocol GFR < 60 (49) -Usual morning medications can be taken with sips of water including aspirin 81 mg.  Plan to go home the same day, you will only stay overnight if medically necessary.  You must have responsible adult to drive you home.  Someone must be with you the first 24 hours after you arrive home.  Reviewed procedure instructions with patient.

## 2023-06-19 ENCOUNTER — Other Ambulatory Visit: Payer: Self-pay

## 2023-06-19 ENCOUNTER — Ambulatory Visit (HOSPITAL_COMMUNITY)
Admission: RE | Disposition: A | Discharge status: Home / Self Care | Payer: Self-pay | Source: Home / Self Care | Attending: Cardiovascular Disease

## 2023-06-19 ENCOUNTER — Ambulatory Visit (HOSPITAL_COMMUNITY)
Admission: RE | Admit: 2023-06-19 | Discharge: 2023-06-19 | Disposition: A | Discharge status: Home / Self Care | Attending: Cardiovascular Disease | Admitting: Cardiovascular Disease

## 2023-06-19 DIAGNOSIS — I5032 Chronic diastolic (congestive) heart failure: Secondary | ICD-10-CM | POA: Diagnosis not present

## 2023-06-19 DIAGNOSIS — I340 Nonrheumatic mitral (valve) insufficiency: Secondary | ICD-10-CM | POA: Insufficient documentation

## 2023-06-19 DIAGNOSIS — I2510 Atherosclerotic heart disease of native coronary artery without angina pectoris: Secondary | ICD-10-CM | POA: Diagnosis not present

## 2023-06-19 HISTORY — PX: RIGHT/LEFT HEART CATH AND CORONARY ANGIOGRAPHY: CATH118266

## 2023-06-19 LAB — GLUCOSE, CAPILLARY
Glucose-Capillary: 179 mg/dL — ABNORMAL HIGH (ref 70–99)
Glucose-Capillary: 167 mg/dL — ABNORMAL HIGH (ref 70–99)

## 2023-06-19 LAB — POCT I-STAT 7, (LYTES, BLD GAS, ICA,H+H)
Acid-base deficit: 2 mmol/L (ref 0.0–2.0)
Bicarbonate: 23.8 mmol/L (ref 20.0–28.0)
Calcium, Ion: 1.2 mmol/L (ref 1.15–1.40)
HCT: 32 % — ABNORMAL LOW (ref 39.0–52.0)
Hemoglobin: 10.9 g/dL — ABNORMAL LOW (ref 13.0–17.0)
O2 Saturation: 92 %
Potassium: 3.6 mmol/L (ref 3.5–5.1)
Sodium: 141 mmol/L (ref 135–145)
TCO2: 25 mmol/L (ref 22–32)
pCO2 arterial: 43 mmHg (ref 32–48)
pH, Arterial: 7.351 (ref 7.35–7.45)
pO2, Arterial: 67 mmHg — ABNORMAL LOW (ref 83–108)

## 2023-06-19 LAB — POCT I-STAT EG7
Acid-base deficit: 1 mmol/L (ref 0.0–2.0)
Bicarbonate: 24.9 mmol/L (ref 20.0–28.0)
Calcium, Ion: 1.2 mmol/L (ref 1.15–1.40)
HCT: 32 % — ABNORMAL LOW (ref 39.0–52.0)
Hemoglobin: 10.9 g/dL — ABNORMAL LOW (ref 13.0–17.0)
O2 Saturation: 65 %
Potassium: 3.6 mmol/L (ref 3.5–5.1)
Sodium: 141 mmol/L (ref 135–145)
TCO2: 26 mmol/L (ref 22–32)
pCO2, Ven: 45.1 mmHg (ref 44–60)
pH, Ven: 7.349 (ref 7.25–7.43)
pO2, Ven: 35 mmHg (ref 32–45)

## 2023-06-19 SURGERY — RIGHT/LEFT HEART CATH AND CORONARY ANGIOGRAPHY
Anesthesia: LOCAL

## 2023-06-19 MED ORDER — HEPARIN SODIUM (PORCINE) 1000 UNIT/ML IJ SOLN
INTRAMUSCULAR | Status: DC | PRN
  Administered 2023-06-19: 5000 [IU] via INTRAVENOUS

## 2023-06-19 MED ORDER — ONDANSETRON HCL 4 MG/2ML IJ SOLN: 4.0000 mg | Freq: Four times a day (QID) | INTRAMUSCULAR | Status: DC | PRN

## 2023-06-19 MED ORDER — LABETALOL HCL 5 MG/ML IV SOLN: 10.0000 mg | INTRAVENOUS | Status: DC | PRN

## 2023-06-19 MED ORDER — ACETAMINOPHEN 325 MG PO TABS: 650.0000 mg | ORAL_TABLET | ORAL | Status: DC | PRN

## 2023-06-19 MED ORDER — IOHEXOL 350 MG/ML SOLN
INTRAVENOUS | Status: DC | PRN
  Administered 2023-06-19: 50 mL

## 2023-06-19 MED ORDER — ASPIRIN 81 MG PO CHEW: 81.0000 mg | CHEWABLE_TABLET | ORAL | Status: DC

## 2023-06-19 MED ORDER — MIDAZOLAM HCL 2 MG/2ML IJ SOLN
INTRAMUSCULAR | Status: DC | PRN
  Administered 2023-06-19: 1 mg via INTRAVENOUS

## 2023-06-19 MED ORDER — MIDAZOLAM HCL 2 MG/2ML IJ SOLN
INTRAMUSCULAR | Status: AC
  Filled 2023-06-19: qty 2

## 2023-06-19 MED ORDER — SODIUM CHLORIDE 0.9% FLUSH: 3.0000 mL | Freq: Two times a day (BID) | INTRAVENOUS | Status: DC

## 2023-06-19 MED ORDER — HYDRALAZINE HCL 20 MG/ML IJ SOLN: 10.0000 mg | INTRAMUSCULAR | Status: DC | PRN

## 2023-06-19 MED ORDER — SODIUM CHLORIDE 0.9 % IV SOLN: 250.0000 mL | INTRAVENOUS | Status: DC | PRN

## 2023-06-19 MED ORDER — LIDOCAINE HCL (PF) 1 % IJ SOLN
INTRAMUSCULAR | Status: AC
Start: 2023-06-19 — End: ?
  Filled 2023-06-19: qty 30

## 2023-06-19 MED ORDER — SODIUM CHLORIDE 0.9% FLUSH: 3.0000 mL | INTRAVENOUS | Status: DC | PRN

## 2023-06-19 MED ORDER — HEPARIN SODIUM (PORCINE) 1000 UNIT/ML IJ SOLN
INTRAMUSCULAR | Status: AC
  Filled 2023-06-19: qty 10

## 2023-06-19 MED ORDER — SODIUM CHLORIDE 0.9 % WEIGHT BASED INFUSION
3.0000 mL/kg/h | INTRAVENOUS | Status: AC
  Administered 2023-06-19: 3 mL/kg/h via INTRAVENOUS

## 2023-06-19 MED ORDER — VERAPAMIL HCL 2.5 MG/ML IV SOLN
INTRAVENOUS | Status: DC | PRN
  Administered 2023-06-19: 10 mL via INTRA_ARTERIAL

## 2023-06-19 MED ORDER — FENTANYL CITRATE (PF) 100 MCG/2ML IJ SOLN
INTRAMUSCULAR | Status: DC | PRN
  Administered 2023-06-19: 25 ug via INTRAVENOUS

## 2023-06-19 MED ORDER — FENTANYL CITRATE (PF) 100 MCG/2ML IJ SOLN
INTRAMUSCULAR | Status: AC
  Filled 2023-06-19: qty 2

## 2023-06-19 MED ORDER — HEPARIN (PORCINE) IN NACL 1000-0.9 UT/500ML-% IV SOLN
INTRAVENOUS | Status: DC | PRN
  Administered 2023-06-19: 1000 mL

## 2023-06-19 MED ORDER — SODIUM CHLORIDE 0.9 % WEIGHT BASED INFUSION: 1.0000 mL/kg/h | INTRAVENOUS | Status: DC

## 2023-06-19 MED ORDER — LIDOCAINE HCL (PF) 1 % IJ SOLN
INTRAMUSCULAR | Status: AC
  Filled 2023-06-19: qty 30

## 2023-06-19 MED ORDER — VERAPAMIL HCL 2.5 MG/ML IV SOLN
INTRAVENOUS | Status: AC
  Filled 2023-06-19: qty 2

## 2023-06-19 MED ORDER — LIDOCAINE HCL (PF) 1 % IJ SOLN
INTRAMUSCULAR | Status: DC | PRN
  Administered 2023-06-19: 5 mL

## 2023-06-19 SURGICAL SUPPLY — 12 items
CATH 5FR JL3.5 JR4 ANG PIG MP (CATHETERS) IMPLANT
CATH INFINITI 5FR AL1 (CATHETERS) IMPLANT
CATH SWAN GANZ 7F STRAIGHT (CATHETERS) IMPLANT
DEVICE RAD COMP TR BAND LRG (VASCULAR PRODUCTS) IMPLANT
GLIDESHEATH SLEND SS 6F .021 (SHEATH) IMPLANT
GLIDESHEATH SLENDER 7FR .021G (SHEATH) IMPLANT
GUIDEWIRE INQWIRE 1.5J.035X260 (WIRE) IMPLANT
INQWIRE 1.5J .035X260CM (WIRE) ×1 IMPLANT
PACK CARDIAC CATHETERIZATION (CUSTOM PROCEDURE TRAY) ×1 IMPLANT
SET ATX-X65L (MISCELLANEOUS) IMPLANT
SHEATH 6FR 85 DEST SLENDER (SHEATH) IMPLANT
WIRE MICROINTRODUCER 60CM (WIRE) IMPLANT

## 2023-06-19 NOTE — Interval H&P Note (Signed)
 History and Physical Interval Note:  06/19/2023 8:38 AM  [name] White  has presented today for surgery, with the diagnosis of severe MR.  The various methods of treatment have been discussed with the patient and family. After consideration of risks, benefits and other options for treatment, the patient has consented to  Procedure(s): RIGHT/LEFT HEART CATH AND CORONARY ANGIOGRAPHY (N/A) as a surgical intervention.  The patient's history has been reviewed, patient examined, no change in status, stable for surgery.  I have reviewed the patient's chart and labs.  Questions were answered to the patient's satisfaction.     Tonny Bollman

## 2023-06-19 NOTE — Discharge Instructions (Signed)

## 2023-06-20 ENCOUNTER — Encounter (HOSPITAL_COMMUNITY): Payer: Self-pay | Admitting: Cardiovascular Disease

## 2023-06-20 MED FILL — Lidocaine HCl Local Preservative Free (PF) Inj 1%: INTRAMUSCULAR | Qty: 30 | Status: AC

## 2023-06-25 ENCOUNTER — Telehealth: Payer: Self-pay

## 2023-06-25 NOTE — Telephone Encounter (Signed)
 [name] White:  Fossa looks good for transseptal approach. Enough room within LA to straddle device and steer down to the valve. Patient has severe secondary MR with anterior/posterior leaflet tethering, anterior override and an eccentric posterior directed jet. Bicomm and 3D views confirm a broad jet across A2P2. Long axis windows have posterior leaflet measuring between ~0.8 and 0.94cm. Will want to confirm posterior leaflet length at exact grasping zone day of the procedure. Larger valve area noted at 6.10cm2 and a gradient of at 74bpm. Due to the extent of the MR, expecting possibly a 2 clip case. Plan on placing first clip at A2P2 and a shade medial with a plan to place the second clip just lateral. NTW/ XTW appropriate as first clip options. Larger clip option dependent on confirmation of at least 0.9cm of posterior leaflet at the grasping site.

## 2023-07-02 ENCOUNTER — Encounter: Payer: Self-pay | Admitting: Cardiovascular Disease

## 2023-07-02 NOTE — Progress Notes (Unsigned)
 Multidisciplinary Heart Valve Team Documentation: Case reviewed with imaging specialists and valve team. There is significant posterior leaflet calcification at risk of leaflet trauma or poor result with TEER. Pt minimally symptomatic. Reviewed with patient that we favor continued medical therapy and reserve TEER for progressive symptoms as risk of complication is increased because of his mitral valve anatomy. Pt understands and agrees.   [name] White 07/02/23 11:24 AM

## 2023-08-06 DIAGNOSIS — H43812 Vitreous degeneration, left eye: Secondary | ICD-10-CM | POA: Diagnosis not present

## 2023-08-06 DIAGNOSIS — H31091 Other chorioretinal scars, right eye: Secondary | ICD-10-CM | POA: Diagnosis not present

## 2023-08-07 ENCOUNTER — Ambulatory Visit: Admitting: Podiatry

## 2023-08-07 ENCOUNTER — Encounter: Payer: Self-pay | Admitting: Podiatry

## 2023-08-07 ENCOUNTER — Ambulatory Visit (INDEPENDENT_AMBULATORY_CARE_PROVIDER_SITE_OTHER)

## 2023-08-07 VITALS — Ht 74.0 in | Wt 225.0 lb

## 2023-08-07 DIAGNOSIS — M7732 Calcaneal spur, left foot: Secondary | ICD-10-CM | POA: Diagnosis not present

## 2023-08-07 DIAGNOSIS — M79672 Pain in left foot: Secondary | ICD-10-CM

## 2023-08-07 DIAGNOSIS — S91332A Puncture wound without foreign body, left foot, initial encounter: Secondary | ICD-10-CM | POA: Diagnosis not present

## 2023-08-10 ENCOUNTER — Encounter: Payer: Self-pay | Admitting: Podiatry

## 2023-08-10 NOTE — Progress Notes (Signed)
 Subjective:  Patient ID: [name] White, male    DOB: 26-Jan-1936,  MRN: [id number]  Chief Complaint  Patient presents with   Foot Pain    Patient is here for left heel pain    Discussed the use of AI scribe software for clinical note transcription with the patient, who gave verbal consent to proceed.  History of Present Illness [name] White is an 88 year old male with diabetes who presents with right heel pain.  He has been experiencing right heel pain for the past couple of months, describing it as feeling like 'walking on a bruise.' The pain is localized, and he can point to the specific area of discomfort. He does not recall any specific incident, such as stepping on an object, that might have caused the pain.  He has a history of diabetes, which is managed with regular check-ups. He does not frequently monitor his blood sugar levels himself. He attempted to alleviate the heel pain by soaking his foot for about thirty minutes the day before the visit. No previous episodes of similar heel pain have been reported. He states that his blood sugar levels are 'doing pretty good.'      Objective:    Physical Exam VASCULAR: DP and PT pulse palpable. Foot is warm and well-perfused. Capillary fill time is brisk. DERMATOLOGIC: Normal skin turgor, texture, and temperature. No open lesions, rashes, or ulcerations. NEUROLOGIC: Normal sensation to light touch and pressure. No paresthesias. ORTHOPEDIC: Plantar right heel with sharp pain on palpation, small hyperkeratotic area with deep puncture wound, very tender. No active cellulitis or deep infection in right heel. right heel with small puncture wound and surrounding hyperkeratosis.     No images are attached to the encounter.    Results Procedure: Incision and Drainage (I&D) of right heel Description: The right heel was anesthetized with 2 cc each of 0.5% Marcaine  and 1% lidocaine  with epinephrine . A scalpel blade was used to remove  the overlying hyperkeratosis to the subcutaneous layer. A small amount of purulence was found and irrigated. The wound was explored and palpated, and no retained foreign body or sinus tract was found. The area was bandaged and post-care instructions were given. Informed Consent: Counseling included the risks of infection, benefits of pain relief, and alternatives such as cortisone or steroid injections. The patient agreed to proceed with the procedure.  RADIOLOGY Heel radiograph: No retained radiographic foreign body (08/07/2023)     Assessment:   1. Puncture wound of left heel without complication, initial encounter      Plan:  Patient was evaluated and treated and all questions answered.  Assessment and Plan Assessment & Plan Puncture wound with abscess, right heel Presents with a puncture wound on the right heel, characterized by a small area of hyperkeratotic skin with a deep puncture wound. Very tender to palpation with mild serous drainage. No active cellulitis or deep infection. Radiograph shows no retained radiographic foreign body. Debridement reveals a small abscess with purulence. No foreign body or sinus tract found. The wound was explored and irrigated. - Perform incision and drainage of the right heel following consent and preparation with Betadine. - Anesthetize the right heel with 2 cc each of 0.5% Marcaine  and 1% lidocaine  with epinephrine . - Use a scalpel blade to remove the overlying hyperkeratosis to the subcutaneous layer. - Irrigate the wound and explore to ensure no retained foreign body. - Bandage the wound and provide post-care instructions.  Heel spur Heel spur visible on  x-ray, potentially contributing to heel pain. Not currently considered for removal. If pain persists after addressing the puncture wound, a cortisone or steroid injection may be considered to reduce inflammation. - Consider cortisone or steroid injection in the area if pain persists after  addressing the puncture wound.  Diabetes Diabetes relevant to the management of the puncture wound due to potential complications. Blood sugar reportedly well-controlled, although not checked regularly by him but monitored by a doctor.      No follow-ups on file.

## 2023-08-22 ENCOUNTER — Encounter: Payer: Self-pay | Admitting: Physician Assistant

## 2023-09-01 NOTE — Progress Notes (Signed)
 Brigitte Canard, PA-C [address] [place], [place]  [address] Phone: [phone]   Gastroenterology Consultation  Referring Provider:     Genia Kettering, MD Primary Care Physician:  Genia Kettering, MD Primary Gastroenterologist:  Brigitte Canard, PA-C / Alvester Johnson, MD  Reason for Consultation:     Iron deficiency anemia, GI bleeding        HPI:   [name] White is a 88 y.o. y/o male referred for consultation & management  by Plotnikov, Aleksei V, MD. Previous patient of Dr. Sandrea Cruel.  Patient is referred to evaluate iron deficiency anemia and GI bleeding.  Here to discuss repeat EGD and colonoscopy.  He has history of chronic iron deficiency anemia for many years, recently worsening.  Currently on Eliquis .  Patient denies bright red rectal bleeding, hematemesis, or black tarry stools.  He states his stools have been a little dark.  He is not currently taking oral iron or Pepto-Bismol.  Currently having 1 formed bowel movement every morning.  Denies abdominal pain, dysphagia, nausea, or vomiting.  He admits to occasional heartburn and mild weight loss.  Not eating as much.  He is followed by oncologist for leukemia.  3 months ago it was noted he had worsening iron deficiency.  He received 5 IV iron infusions which helped.  His iron has recently been dropping again.  He admits to weakness and fatigue.  Is in a wheelchair today.  Also walks with a cane.  12/2015 last EGD at [facility] in [place] (to evaluate iron deficiency anemia): 1 cm hiatal hernia, otherwise normal esophagus.  Multiple benign gastric polyps.  12/2015 last Colonoscopy at [facility] in [place] (history of colon polyps, IDA): Sigmoid diverticulosis, otherwise no polyps.  No further colonoscopy recommended due to advanced age.  06/05/2023 labs: Hgb 11.3, HCT 36, MCV 82, platelet 259.  Creatinine 1.41, GFR 49.  07/2023 labs: Hgb 9.0, hematocrit 29, MCV 78.  PMH: CHF, A-fib, CAD, HTN, severe aortic stenosis, sleep  apnea, GERD, hypothyroid, type 2 diabetes, atrial flutter, leukemia (diagnosed 05/2018), COPD, emphysema, history of total aortic valve replacement. Currently on Eliquis .  Cardiologist Dr. Arlester Ladd.  Past Medical History:  Diagnosis Date   Anemia 06/04/2015   Anxiety    Atrial flutter (HCC)    s/p ablation 07/17/08   Cancer (HCC)    leukemia - dx 05/2018   Chronic diastolic CHF (congestive heart failure) (HCC)    Coronary artery disease    Depression    Diabetes mellitus type 2 in obese 04/10/2009   Qualifier: Diagnosis of  By: Larrie Po MD, Wilmon Hashimoto, diet controlled, lost 60lbs   Emphysema of lung (HCC)    GERD (gastroesophageal reflux disease)    Glaucoma    recent dx - has an appt to be evaluated and meds to treat   H/O hiatal hernia    Hearing loss    some hearing loss in left ear but has not been dx, no hearing aids   Hepatitis    h/o of type A   History of measles as a child    History of mumps as a child    Hypertension    Hypothyroidism    Micturition syncope    Osteoarthritis    Pleural effusion, bilateral    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   26 mm Edwards Sapien 3 transcatheter heart valve placed via percutaneous right transfemoral approach    Severe aortic stenosis    Sleep apnea  uses CPAP nightly   Vitamin D  deficiency 11/24/2015    Past Surgical History:  Procedure Laterality Date   CARDIAC CATHETERIZATION  03/18/2019   CARDIAC ELECTROPHYSIOLOGY MAPPING AND ABLATION  06/2008   CARDIOVERSION N/A 04/07/2023   Procedure: CARDIOVERSION;  Surgeon: Lauralee Poll, MD;  Location: [facility] INVASIVE CV LAB;  Service: Cardiovascular;  Laterality: N/A;   CATARACT EXTRACTION W/ INTRAOCULAR LENS  IMPLANT, BILATERAL  ~ 2007   COLONOSCOPY     FOOT TENDON SURGERY Left    INTRAOPERATIVE TRANSTHORACIC ECHOCARDIOGRAM N/A 04/13/2019   Procedure: TRANSTHORACIC ECHOCARDIOGRAM;  Surgeon: Arnoldo Lapping, MD;  Location: [facility] OR;  Service: Open Heart Surgery;   Laterality: N/A;   RIGHT/LEFT HEART CATH AND CORONARY ANGIOGRAPHY N/A 03/18/2019   Procedure: RIGHT/LEFT HEART CATH AND CORONARY ANGIOGRAPHY;  Surgeon: Mardell Shade, MD;  Location: [facility] INVASIVE CV LAB;  Service: Cardiovascular;  Laterality: N/A;   RIGHT/LEFT HEART CATH AND CORONARY ANGIOGRAPHY N/A 06/19/2023   Procedure: RIGHT/LEFT HEART CATH AND CORONARY ANGIOGRAPHY;  Surgeon: Arnoldo Lapping, MD;  Location: [facility] INVASIVE CV LAB;  Service: Cardiovascular;  Laterality: N/A;   TONSILLECTOMY  1940's   TOTAL KNEE ARTHROPLASTY  1990's   right   TOTAL KNEE ARTHROPLASTY Left 08/02/2013   Procedure: LEFT TOTAL KNEE ARTHROPLASTY;  Surgeon: Aurther Blue, MD;  Location: [facility] ORS;  Service: Orthopedics;  Laterality: Left;   TRANSCATHETER AORTIC VALVE REPLACEMENT, TRANSFEMORAL N/A 04/13/2019   Procedure: TRANSCATHETER AORTIC VALVE REPLACEMENT, TRANSFEMORAL;  Surgeon: Arnoldo Lapping, MD;  Location: [facility] OR;  Service: Open Heart Surgery;  Laterality: N/A;   TRANSESOPHAGEAL ECHOCARDIOGRAM (CATH LAB) N/A 04/07/2023   Procedure: TRANSESOPHAGEAL ECHOCARDIOGRAM;  Surgeon: Lauralee Poll, MD;  Location: [facility] INVASIVE CV LAB;  Service: Cardiovascular;  Laterality: N/A;   UPPER GI ENDOSCOPY     WISDOM TOOTH EXTRACTION      Prior to Admission medications   Medication Sig Start Date End Date Taking? Authorizing Provider  acetaminophen  (TYLENOL ) 500 MG tablet Take 500 mg by mouth every 6 (six) hours as needed for moderate pain (pain score 4-6).    [provider]  amLODipine  (NORVASC ) 10 MG tablet Take 10 mg by mouth daily. 05/25/21   [provider]  amoxicillin  (AMOXIL ) 500 MG tablet Take 4 capsules (2,000 mg) one hour prior to all dental visits. 04/21/19   Ardia Kraft, PA-C  apixaban  (ELIQUIS ) 5 MG TABS tablet Take 5 mg by mouth 2 (two) times daily. 12/09/22   [provider]  carboxymethylcellulose (REFRESH PLUS) 0.5 % SOLN Place 1 drop into both eyes as needed (dry eyes).     [provider]  cholecalciferol (VITAMIN D3) 25 MCG (1000 UNIT) tablet Take 1,000 Units by mouth at bedtime.    [provider]  CINNAMON PO Take 2,000 mg by mouth 2 (two) times daily.     [provider]  Coenzyme Q10 (CVS COQ-10) 200 MG capsule Take 200 mg by mouth every evening.     [provider]  empagliflozin (JARDIANCE) 25 MG TABS tablet Take 25 mg by mouth daily.    [provider]  finasteride  (PROSCAR ) 5 MG tablet Take 5 mg by mouth every evening.    [provider]  furosemide  (LASIX ) 20 MG tablet TAKE 2 TABLETS (40 MG TOTAL) BY MOUTH DAILY. 06/04/23   Bensimhon, Rheta Celestine, MD  latanoprost (XALATAN) 0.005 % ophthalmic solution Place 1 drop into both eyes at bedtime.    [provider]  levothyroxine  (SYNTHROID ) 200 MCG tablet Take  200 mcg by mouth daily before breakfast.    [provider]  Melatonin 10 MG CAPS Take 10 mg by mouth at bedtime as needed (sleep).    [provider]  metFORMIN (GLUCOPHAGE-XR) 500 MG 24 hr tablet Take 500 mg by mouth 2 (two) times daily.    [provider]  mirabegron ER (MYRBETRIQ) 50 MG TB24 tablet Take 50 mg by mouth daily. 01/21/23   [provider]  nilotinib (TASIGNA) 150 MG capsule Take 150 mg by mouth 2 (two) times daily. Take on an empty stomach, 1 hr before or 2 hrs after food.    [provider]  PARoxetine  (PAXIL ) 40 MG tablet Take 1 tablet (40 mg total) by mouth every morning. 09/14/13   Janece Means, MD  potassium chloride  SA (KLOR-CON  M20) 20 MEQ tablet TAKE 1 TABLET BY MOUTH EVERY DAY 09/23/22   Bensimhon, Rheta Celestine, MD  rosuvastatin  (CRESTOR ) 10 MG tablet TAKE 1 TABLET (10 MG TOTAL) BY MOUTH DAILY. PLEASE SCHEDULE AN APPOINTMENT FOR FURTHER REFILLS 04/14/23   Bensimhon, Rheta Celestine, MD  spironolactone  (ALDACTONE ) 25 MG tablet Take 25 mg by mouth daily.    [provider]  timolol (BETIMOL) 0.5 % ophthalmic solution Place 1 drop into  both eyes daily.    [provider]  traZODone  (DESYREL ) 50 MG tablet Take 50 mg by mouth at bedtime.    [provider]  TURMERIC PO Take 1,000 mg by mouth every evening.     [provider]    Family History  Problem Relation Age of Onset   Multiple sclerosis Father    Heart disease Mother        atrial fibrillation   Hypertension Son    Heart disease Maternal Grandfather      Social History   Tobacco Use   Smoking status: Former    Types: Pipe, Cigars    Quit date: 04/01/1996    Years since quitting: 27.4   Smokeless tobacco: Former    Types: Chew    Quit date: 04/01/2009  Vaping Use   Vaping status: Never Used  Substance Use Topics   Alcohol use: Not Currently    Alcohol/week: 0.0 standard drinks of alcohol    Comment: Quit wine early 2020.   Drug use: No    Allergies as of 09/02/2023 - Review Complete 09/02/2023  Allergen Reaction Noted   Ace inhibitors Anaphylaxis and Swelling 08/26/2013   Benazepril  Anaphylaxis and Swelling 02/01/2014   Codeine Itching and Rash    Statins Other (See Comments)    Piroxicam Hives 07/19/2013   Pseudoephedrine Other (See Comments) 07/19/2013   Brinzolamide-brimonidine  10/01/2021   Dasatinib   04/20/2019   Betadine [povidone iodine ] Hives and Rash 04/15/2019    Review of Systems:    All systems reviewed and negative except where noted in HPI.   Physical Exam:  BP 102/62   Pulse 86   Ht 6\' 2"  (1.88 m)   Wt 217 lb 9 oz (98.7 kg)   SpO2 97%   BMI 27.93 kg/m  No LMP for male patient.  General:   Feeble, elderly male, pleasant and cooperative in NAD. Sitting in a wheelchair.  Also walks with a cane.  He was able to get onto exam table with  Lungs:  Respirations even and unlabored.  Clear throughout to auscultation.   No wheezes, crackles, or rhonchi. No acute distress. Heart:  Regular rate and rhythm; no murmurs, clicks, rubs, or gallops. Abdomen:  Normal  bowel sounds.  No bruits.  Soft, and  non-distended without masses, hepatosplenomegaly or hernias noted.  No Tenderness.  No guarding or rebound tenderness.    Rectal: No external hemorrhoids or lesions.  Stool is dark brown and heme Positive.  No rectal masses or tenderness.  No gross blood. Neurologic:  Alert and oriented x3;  grossly normal neurologically. Psych:  Alert and cooperative. Normal mood and affect.  Chaperone for Exam:  Nana Avers, CMA  Imaging Studies: DG Foot Complete Left Result Date: 08/07/2023 Please see detailed radiograph report in office note.   Labs: CBC    Component Value Date/Time   WBC 4.4 09/02/2023 1453   RBC 3.00 (L) 09/02/2023 1453   HGB 7.2 cL (LL) 09/02/2023 1453   HGB 11.3 (L) 06/05/2023 1355   HCT 23.0 cL (LL) 09/02/2023 1453   HCT 36.7 (L) 06/05/2023 1355   PLT 226.0 09/02/2023 1453   PLT 259 06/05/2023 1355   MCV 76.8 (L) 09/02/2023 1453   MCV 82 06/05/2023 1355   MCH 25.3 (L) 06/05/2023 1355   MCH 26.0 03/25/2023 1103   MCHC 31.3 09/02/2023 1453   RDW 18.9 (H) 09/02/2023 1453   RDW 15.4 06/05/2023 1355   LYMPHSABS 0.8 09/02/2023 1453   MONOABS 0.5 09/02/2023 1453   EOSABS 0.1 09/02/2023 1453   BASOSABS 0.0 09/02/2023 1453    CMP     Component Value Date/Time   NA 136 09/02/2023 1035   NA 137 06/05/2023 1355   K 4.9 09/02/2023 1035   CL 103 09/02/2023 1035   CO2 23 09/02/2023 1035   GLUCOSE 191 (H) 09/02/2023 1035   BUN 36 (H) 09/02/2023 1035   BUN 25 06/05/2023 1355   CREATININE 1.51 (H) 09/02/2023 1035   CREATININE 1.16 05/26/2015 1428   CALCIUM  9.3 09/02/2023 1035   PROT 7.3 09/02/2023 1453   ALBUMIN 4.0 09/02/2023 1453   AST 19 09/02/2023 1453   ALT 13 09/02/2023 1453   ALKPHOS 60 09/02/2023 1453   BILITOT 0.4 09/02/2023 1453   GFRNONAA 45 (L) 09/02/2023 1035   GFRAA >60 08/16/2019 1015    Assessment and Plan:   [name] White is a 88 y.o. y/o male has been referred for:  1.  Chronic iron deficiency anemia, recently worsened - Stat Labs: CBC,  iron panel, ferritin, B12  2.  GI Bleeding - Hemoccult Exam Positive Today.  3.  Weakness / Fatigue  4.  Multiple comorbidities: CHF, A-fib, CAD, HTN, severe aortic stenosis, sleep apnea, GERD, hypothyroid, type 2 diabetes, atrial flutter, leukemia (diagnosed 05/2018), COPD, emphysema, history of total aortic valve replacement.  - Currently on Eliquis .  NOTE: Patient's lab results returned after his office visit.  Hemoglobin 7.2G.  We called and notified patient and his wife to take him to the ED today, and they agreed.  Follow up will be based on hospital recommendations.  Brigitte Canard, PA-C

## 2023-09-02 ENCOUNTER — Telehealth: Payer: Self-pay

## 2023-09-02 ENCOUNTER — Encounter: Payer: Self-pay | Admitting: Physician Assistant

## 2023-09-02 ENCOUNTER — Ambulatory Visit: Payer: Self-pay | Admitting: Physician Assistant

## 2023-09-02 ENCOUNTER — Emergency Department (HOSPITAL_COMMUNITY)

## 2023-09-02 ENCOUNTER — Encounter (HOSPITAL_BASED_OUTPATIENT_CLINIC_OR_DEPARTMENT_OTHER): Payer: Self-pay | Admitting: Urology

## 2023-09-02 ENCOUNTER — Other Ambulatory Visit: Payer: Self-pay

## 2023-09-02 ENCOUNTER — Ambulatory Visit: Admitting: Physician Assistant

## 2023-09-02 ENCOUNTER — Encounter (HOSPITAL_COMMUNITY): Payer: Self-pay | Admitting: Emergency Medicine

## 2023-09-02 ENCOUNTER — Encounter (HOSPITAL_COMMUNITY): Payer: Self-pay | Admitting: Cardiology

## 2023-09-02 ENCOUNTER — Other Ambulatory Visit (INDEPENDENT_AMBULATORY_CARE_PROVIDER_SITE_OTHER)

## 2023-09-02 ENCOUNTER — Ambulatory Visit (HOSPITAL_COMMUNITY)
Admission: RE | Admit: 2023-09-02 | Discharge: 2023-09-02 | Disposition: A | Discharge status: Home / Self Care | Source: Ambulatory Visit | Attending: Cardiology | Admitting: Cardiology

## 2023-09-02 ENCOUNTER — Emergency Department (HOSPITAL_BASED_OUTPATIENT_CLINIC_OR_DEPARTMENT_OTHER)
Admission: EM | Admit: 2023-09-02 | Discharge: 2023-09-02 | Disposition: A | Discharge status: Against Medical Advice | Attending: Cardiology | Admitting: Cardiology

## 2023-09-02 ENCOUNTER — Emergency Department (HOSPITAL_COMMUNITY)
Admission: EM | Admit: 2023-09-02 | Discharge: 2023-09-03 | Disposition: A | Discharge status: Home / Self Care | Attending: Emergency Medicine | Admitting: Emergency Medicine

## 2023-09-02 VITALS — BP 102/62 | HR 86 | Ht 74.0 in | Wt 217.6 lb

## 2023-09-02 VITALS — BP 94/50 | HR 80 | Wt 219.8 lb

## 2023-09-02 DIAGNOSIS — R0602 Shortness of breath: Secondary | ICD-10-CM | POA: Diagnosis not present

## 2023-09-02 DIAGNOSIS — N1832 Chronic kidney disease, stage 3b: Secondary | ICD-10-CM | POA: Insufficient documentation

## 2023-09-02 DIAGNOSIS — C9210 Chronic myeloid leukemia, BCR/ABL-positive, not having achieved remission: Secondary | ICD-10-CM | POA: Insufficient documentation

## 2023-09-02 DIAGNOSIS — K922 Gastrointestinal hemorrhage, unspecified: Secondary | ICD-10-CM | POA: Diagnosis not present

## 2023-09-02 DIAGNOSIS — E611 Iron deficiency: Secondary | ICD-10-CM | POA: Diagnosis not present

## 2023-09-02 DIAGNOSIS — D649 Anemia, unspecified: Secondary | ICD-10-CM | POA: Insufficient documentation

## 2023-09-02 DIAGNOSIS — Z7901 Long term (current) use of anticoagulants: Secondary | ICD-10-CM | POA: Insufficient documentation

## 2023-09-02 DIAGNOSIS — I517 Cardiomegaly: Secondary | ICD-10-CM | POA: Diagnosis not present

## 2023-09-02 DIAGNOSIS — J90 Pleural effusion, not elsewhere classified: Secondary | ICD-10-CM | POA: Diagnosis not present

## 2023-09-02 DIAGNOSIS — R7989 Other specified abnormal findings of blood chemistry: Secondary | ICD-10-CM | POA: Diagnosis not present

## 2023-09-02 DIAGNOSIS — E785 Hyperlipidemia, unspecified: Secondary | ICD-10-CM | POA: Insufficient documentation

## 2023-09-02 DIAGNOSIS — I340 Nonrheumatic mitral (valve) insufficiency: Secondary | ICD-10-CM | POA: Insufficient documentation

## 2023-09-02 DIAGNOSIS — G4730 Sleep apnea, unspecified: Secondary | ICD-10-CM | POA: Insufficient documentation

## 2023-09-02 DIAGNOSIS — Z952 Presence of prosthetic heart valve: Secondary | ICD-10-CM | POA: Insufficient documentation

## 2023-09-02 DIAGNOSIS — I2510 Atherosclerotic heart disease of native coronary artery without angina pectoris: Secondary | ICD-10-CM | POA: Insufficient documentation

## 2023-09-02 DIAGNOSIS — I130 Hypertensive heart and chronic kidney disease with heart failure and stage 1 through stage 4 chronic kidney disease, or unspecified chronic kidney disease: Secondary | ICD-10-CM | POA: Diagnosis not present

## 2023-09-02 DIAGNOSIS — D509 Iron deficiency anemia, unspecified: Secondary | ICD-10-CM | POA: Insufficient documentation

## 2023-09-02 DIAGNOSIS — Z5321 Procedure and treatment not carried out due to patient leaving prior to being seen by health care provider: Secondary | ICD-10-CM | POA: Insufficient documentation

## 2023-09-02 DIAGNOSIS — I4821 Permanent atrial fibrillation: Secondary | ICD-10-CM | POA: Insufficient documentation

## 2023-09-02 DIAGNOSIS — M778 Other enthesopathies, not elsewhere classified: Secondary | ICD-10-CM | POA: Insufficient documentation

## 2023-09-02 DIAGNOSIS — E1122 Type 2 diabetes mellitus with diabetic chronic kidney disease: Secondary | ICD-10-CM | POA: Insufficient documentation

## 2023-09-02 DIAGNOSIS — R0989 Other specified symptoms and signs involving the circulatory and respiratory systems: Secondary | ICD-10-CM | POA: Diagnosis not present

## 2023-09-02 DIAGNOSIS — E119 Type 2 diabetes mellitus without complications: Secondary | ICD-10-CM | POA: Insufficient documentation

## 2023-09-02 DIAGNOSIS — R5383 Other fatigue: Secondary | ICD-10-CM

## 2023-09-02 DIAGNOSIS — I6523 Occlusion and stenosis of bilateral carotid arteries: Secondary | ICD-10-CM | POA: Diagnosis not present

## 2023-09-02 DIAGNOSIS — I5032 Chronic diastolic (congestive) heart failure: Secondary | ICD-10-CM | POA: Insufficient documentation

## 2023-09-02 DIAGNOSIS — Z79899 Other long term (current) drug therapy: Secondary | ICD-10-CM | POA: Diagnosis not present

## 2023-09-02 DIAGNOSIS — D500 Iron deficiency anemia secondary to blood loss (chronic): Secondary | ICD-10-CM | POA: Diagnosis not present

## 2023-09-02 DIAGNOSIS — R195 Other fecal abnormalities: Secondary | ICD-10-CM | POA: Diagnosis not present

## 2023-09-02 LAB — COMPREHENSIVE METABOLIC PANEL WITH GFR
ALT: 17 U/L (ref 0–44)
AST: 29 U/L (ref 15–41)
Albumin: 3.7 g/dL (ref 3.5–5.0)
Alkaline Phosphatase: 54 U/L (ref 38–126)
Anion gap: 10 (ref 5–15)
BUN: 39 mg/dL — ABNORMAL HIGH (ref 8–23)
CO2: 22 mmol/L (ref 22–32)
Calcium: 9.2 mg/dL (ref 8.9–10.3)
Chloride: 102 mmol/L (ref 98–111)
Creatinine, Ser: 1.65 mg/dL — ABNORMAL HIGH (ref 0.61–1.24)
GFR, Estimated: 40 mL/min — ABNORMAL LOW (ref >60)
Glucose, Bld: 247 mg/dL — ABNORMAL HIGH (ref 70–99)
Potassium: 4.5 mmol/L (ref 3.5–5.1)
Sodium: 134 mmol/L — ABNORMAL LOW (ref 135–145)
Total Bilirubin: 0.5 mg/dL (ref 0.0–1.2)
Total Protein: 7.5 g/dL (ref 6.5–8.1)

## 2023-09-02 LAB — CBC WITH DIFFERENTIAL/PLATELET
Basophils Absolute: 0 10*3/uL (ref 0.0–0.1)
Basophils Relative: 0.9 % (ref 0.0–3.0)
Eosinophils Absolute: 0.1 10*3/uL (ref 0.0–0.7)
Eosinophils Relative: 3 % (ref 0.0–5.0)
HCT: 23 % — CL (ref 39.0–52.0)
Hemoglobin: 7.2 g/dL — CL (ref 13.0–17.0)
Lymphocytes Relative: 17.5 % (ref 12.0–46.0)
Lymphs Abs: 0.8 10*3/uL (ref 0.7–4.0)
MCHC: 31.3 g/dL (ref 30.0–36.0)
MCV: 76.8 fl — ABNORMAL LOW (ref 78.0–100.0)
Monocytes Absolute: 0.5 10*3/uL (ref 0.1–1.0)
Monocytes Relative: 10.7 % (ref 3.0–12.0)
Neutro Abs: 3 10*3/uL (ref 1.4–7.7)
Neutrophils Relative %: 67.9 % (ref 43.0–77.0)
Platelets: 226 10*3/uL (ref 150.0–400.0)
RBC: 3 Mil/uL — ABNORMAL LOW (ref 4.22–5.81)
RDW: 18.9 % — ABNORMAL HIGH (ref 11.5–15.5)
WBC: 4.4 10*3/uL (ref 4.0–10.5)

## 2023-09-02 LAB — CBC
HCT: 24.9 % — ABNORMAL LOW (ref 39.0–52.0)
Hemoglobin: 7.4 g/dL — ABNORMAL LOW (ref 13.0–17.0)
MCH: 24 pg — ABNORMAL LOW (ref 26.0–34.0)
MCHC: 29.7 g/dL — ABNORMAL LOW (ref 30.0–36.0)
MCV: 80.8 fL (ref 80.0–100.0)
Platelets: 246 10*3/uL (ref 150–400)
RBC: 3.08 MIL/uL — ABNORMAL LOW (ref 4.22–5.81)
RDW: 18.8 % — ABNORMAL HIGH (ref 11.5–15.5)
WBC: 5.8 10*3/uL (ref 4.0–10.5)
nRBC: 0 % (ref 0.0–0.2)

## 2023-09-02 LAB — IBC + FERRITIN
Ferritin: 42.5 ng/mL (ref 22.0–322.0)
Iron: 12 ug/dL — ABNORMAL LOW (ref 42–165)
Saturation Ratios: 3.1 % — ABNORMAL LOW (ref 20.0–50.0)
TIBC: 392 ug/dL (ref 250.0–450.0)
Transferrin: 280 mg/dL (ref 212.0–360.0)

## 2023-09-02 LAB — BASIC METABOLIC PANEL WITH GFR
Anion gap: 10 (ref 5–15)
BUN: 36 mg/dL — ABNORMAL HIGH (ref 8–23)
CO2: 23 mmol/L (ref 22–32)
Calcium: 9.3 mg/dL (ref 8.9–10.3)
Chloride: 103 mmol/L (ref 98–111)
Creatinine, Ser: 1.51 mg/dL — ABNORMAL HIGH (ref 0.61–1.24)
GFR, Estimated: 45 mL/min — ABNORMAL LOW (ref >60)
Glucose, Bld: 191 mg/dL — ABNORMAL HIGH (ref 70–99)
Potassium: 4.9 mmol/L (ref 3.5–5.1)
Sodium: 136 mmol/L (ref 135–145)

## 2023-09-02 LAB — HEPATIC FUNCTION PANEL
ALT: 13 U/L (ref 0–53)
AST: 19 U/L (ref 0–37)
Albumin: 4 g/dL (ref 3.5–5.2)
Alkaline Phosphatase: 60 U/L (ref 39–117)
Bilirubin, Direct: 0.1 mg/dL (ref 0.0–0.3)
Total Bilirubin: 0.4 mg/dL (ref 0.2–1.2)
Total Protein: 7.3 g/dL (ref 6.0–8.3)

## 2023-09-02 LAB — TROPONIN I (HIGH SENSITIVITY): Troponin I (High Sensitivity): 10 ng/L (ref <18)

## 2023-09-02 LAB — PROTIME-INR
INR: 1.3 — ABNORMAL HIGH (ref 0.8–1.2)
Prothrombin Time: 16.6 s — ABNORMAL HIGH (ref 11.4–15.2)

## 2023-09-02 LAB — VITAMIN B12: Vitamin B-12: 752 pg/mL (ref 211–911)

## 2023-09-02 LAB — PREPARE RBC (CROSSMATCH)

## 2023-09-02 LAB — BRAIN NATRIURETIC PEPTIDE: B Natriuretic Peptide: 364.9 pg/mL — ABNORMAL HIGH (ref 0.0–100.0)

## 2023-09-02 MED ORDER — IOHEXOL 350 MG/ML SOLN
100.0000 mL | Freq: Once | INTRAVENOUS | Status: AC | PRN
  Administered 2023-09-02: 100 mL via INTRAVENOUS

## 2023-09-02 MED ORDER — PANTOPRAZOLE SODIUM 40 MG PO TBEC
40.0000 mg | DELAYED_RELEASE_TABLET | Freq: Every day | ORAL | 3 refills | Status: AC
Start: 2023-09-02 — End: 2024-08-27

## 2023-09-02 MED ORDER — SODIUM CHLORIDE 0.9% IV SOLUTION
Freq: Once | INTRAVENOUS | Status: AC
Start: 2023-09-02 — End: 2023-09-03

## 2023-09-02 NOTE — ED Triage Notes (Signed)
 PT states was told to come to ER for hgb of 7.2. Went to [facility] but noticed a lot of people waiting and was told he could not get blood transfusion there and decided to leave and come here. PT states he is currently getting iron infusions leukemia but hasn't noticed any blood in stool or urine. Pt denies any pain

## 2023-09-02 NOTE — Patient Instructions (Addendum)
 We have sent the following medications to your pharmacy for you to pick up at your convenience: Pantoprazole  40 mg daily  We will give you a call to schedule your Colonoscopy and Endoscopy.  Your provider has requested that you go to the basement level for lab work before leaving today. Press "B" on the elevator. The lab is located at the first door on the left as you exit the elevator.  You will be contacted by our office prior to your procedure for directions on holding your Eliquis .  If you do not hear from our office 1 week prior to your scheduled procedure, please call [phone] to discuss.   Thank you for trusting me with your gastrointestinal care!   Brigitte Canard, PA-C  Due to recent changes in healthcare laws, you may see the results of your imaging and laboratory studies on MyChart before your provider has had a chance to review them.  We understand that in some cases there may be results that are confusing or concerning to you. Not all laboratory results come back in the same time frame and the provider may be waiting for multiple results in order to interpret others.  Please give us  48 hours in order for your provider to thoroughly review all the results before contacting the office for clarification of your results.  _______________________________________________________  If your blood pressure at your visit was 140/90 or greater, please contact your primary care physician to follow up on this.  _______________________________________________________  If you are age 5 or older, your body mass index should be between 23-30. Your Body mass index is 27.93 kg/m. If this is out of the aforementioned range listed, please consider follow up with your Primary Care Provider.  If you are age 3 or younger, your body mass index should be between 19-25. Your Body mass index is 27.93 kg/m. If this is out of the aformentioned range listed, please consider follow up with your Primary Care  Provider.   ________________________________________________________  The [facility] providers would like to encourage you to use MYCHART to communicate with providers for non-urgent requests or questions.  Due to long hold times on the telephone, sending your provider a message by [facility] may be a faster and more efficient way to get a response.  Please allow 48 business hours for a response.  Please remember that this is for non-urgent requests.  _______________________________________________________

## 2023-09-02 NOTE — Telephone Encounter (Signed)
 [facility] Pre-operative Risk Assessment     Request for surgical clearance:     Endoscopy Procedure  What type of surgery is being performed?     Colonoscopy and Endoscopy   When is this surgery scheduled?     To Be Determined  What type of clearance is required ?   Pharmacy and Cardiac  Are there any medications that need to be held prior to surgery and how long? Eliquis  2 days   Practice name and name of physician performing surgery?      [facility]  What is your office phone and fax number?      Phone- [phone]  Fax- [phone]  Anesthesia type (None, local, MAC, general) ?       MAC   Please route your response to Lianne Redo, CMA

## 2023-09-02 NOTE — Progress Notes (Signed)
 ADVANCED HEART FAILURE FOLLOW UP CLINIC NOTE  Referring Physician: Genia Kettering, MD  Primary Care: Genia Kettering, MD Primary Cardiologist:  HPI: [name] White is a 88 y.o. male with a past medical history of nonobstructive CAD, hypertension, hyperlipidemia, obesity, sleep apnea, severe aortic stenosis s/p TAVR 04/2019, atrial flutter s/p ablation who presents for follow up of chronic diastolic heart failure.      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 Being treated at [facility]. Has lost over 40 pounds. BP down so clonidine  stopped.    Echo 10/20 EF 55-60% RV ok. Severe AS mean gradient Small effusion. Mild to mod MR/TR. S/p TAVR on 04/13/19.  Echo 05/17/19 EF 60-65%. TAVR ok. RV ok. Mod TR   Echo 01/17/21: EF 60-65% TAVR stable. Severe biatrial enlargement.   Underwent TEE/RHC for worsening mitral regurgitation noted on echocardiogram. Evidence of severe MR by imaging and RHC. Symptoms minimal, and given significant posterior calcification with small leaflet elected to hold off at this time     SUBJECTIVE:  Losing weight appropriately but does note some worsening shortness of breath. Most notable while bending over to tie his shoes. Evaluation somewhat limited by heel spur, limited mobility, and ongoing anemia. CML, but with worsening IDA. Has GI appointment later today.   PMH, current medications, allergies, social history, and family history reviewed in epic.  PHYSICAL EXAM: Vitals:   09/02/23 1017  BP: (!) 94/50  Pulse: 80  SpO2: 99%    GENERAL: Chronically ill appearing PULM:  Normal wob CARDIAC:  WUJ:[id number] elevated, improved from prior        Irregular rate and rhythm.  3/6 Blowing systolic murmur heard over the apex, soft systolic murmur heard in the right upper sternal border, trace lower extremity edema ABDOMEN: Soft, non-tender, non-distended.  DATA REVIEW   ECHO: Echo 10/20 EF 55-60% RV ok. Severe AS mean gradient Small effusion.  Mild to mod MR/TR. S/p TAVR on 04/13/19.  Echo 05/17/19 EF 60-65%. TAVR ok. RV ok. Mod TR   Echo 01/17/21: EF 60-65% TAVR stable. Severe biatrial enlargement.   03/2023: Normal ejection fraction, appropriately performing bioprosthetic aortic valve, worsening pulmonary artery pressures and likely new severe mitral regurgitation, eccentric jet that is not clearly seen on previous echocardiogram  CATH: R/L cath 12/20 Prox RCA to Mid RCA lesion is 30% stenosed. RPAV lesion is 30% stenosed. Ost Cx to Prox Cx lesion is 50% stenosed. Prox LAD to Mid LAD lesion is 20% stenosed  RA: 5, PA: 37/13 (22): PCWP 14, Fick cardiac output 9.8, index 4.3 but no evidence of shunt  06/19/23: RA18, PA 46/19 (31), PCWP 21 with v waves to 34, Fick CO/CI 7.32/3.2, mild nonobstructive disease  ASSESSMENT & PLAN:  Mitral regurgitation: Severe MR, workup including TEE and RHC. Discussed with structural team, given minimal symptoms and calfied leaflet they had elected to hold off. Does have worsening shortness of breath, but multifactorial. He is diuresing well and continues to lose weight. -TEE and structural recs reviewed with patient - Worsening symptoms, but multifactorial - Close follow up arranged, if his anemia improves and he continues to worsen can consider repair -Continue lasix  40mg  daily -Appreciate structural team  Chronic diastolic heart failure: Preserved ejection fraction in the setting of new severe MR.   -Continue Jardiance 25mg  daily - BP low today, stop amlodipine  10mg  daily -Continue Lasix  as above  Severe AS: TAVR 1/21.  Well-functioning valve on echocardiogram, will need SBE prophylaxis. -Yearly echoes for monitoring  Hypertension: Hypotensive today, adjustment as above.  CAD - No current s/s ischemia.  - cath 12/20 with stable non-obstructive CAD - Continue Crestor  Not on ASA due to Eliquis . Not on BB with bradycardia   CML - Following with [facility]. Charlena Con[facility] - On  nilotinib   Atrial fibrillation, permanent - Tikosyn  stopped 5/17 due to persistent AF. - S/p successful cardioversion 03/2023, unfortunately back in afib - Continue Eliquis  5 BID   Iron deficiency anemia: Noted recently, iron deficient despite iron infusion. GI appointment today. In the absence of clinical evidence of bleeding and high CVA risk with permanent afib and valvular disease we discussed that we would elect to continue OAC at this time. He was in agreement. If worsening bleeding could consider risk benefit discussion about stopping   Carotid Stenosis - Carotid duplex 12/2013; stable 0-39% bilateral ICA stenosis - Continue Crestor  - F/u as needed   DM2 - Continue Jardiance 25mg  daily   CKD 3b -Creatinine stable in clinic   Arta Lark, MD Advanced Heart Failure Mechanical Circulatory Support 09/02/23

## 2023-09-02 NOTE — Telephone Encounter (Signed)
 [name] White) received a call from the lab with a critical stating that the pt's Hemoglobin is 7.2 and HCT is 23. I called the patient and informed [name] White that he needs to go to the ER as soon as possible. The pt and [name] White wife are aware and they understand. They stated that they would go to the ER right away.

## 2023-09-02 NOTE — ED Notes (Signed)
 Pt left, told registration he was going to [place] to get blood

## 2023-09-02 NOTE — Patient Instructions (Signed)
 STOP Amlodipine .  Labs done today, your results will be available in MyChart, we will contact you for abnormal readings.  Your physician recommends that you schedule a follow-up appointment in: 2 months.  If you have any questions or concerns before your next appointment please send us  a message through [place] or call our office at [phone].    TO LEAVE A MESSAGE FOR THE NURSE SELECT OPTION 2, PLEASE LEAVE A MESSAGE INCLUDING: YOUR NAME DATE OF BIRTH CALL BACK NUMBER REASON FOR CALL**this is important as we prioritize the call backs  YOU WILL RECEIVE A CALL BACK THE SAME DAY AS LONG AS YOU CALL BEFORE 4:00 PM  At the [facility], you and your health needs are our priority. As part of our continuing mission to provide you with exceptional heart care, we have created designated Provider Care Teams. These Care Teams include your primary Cardiologist (physician) and Advanced Practice Providers (APPs- Physician Assistants and Nurse Practitioners) who all work together to provide you with the care you need, when you need it.   You may see any of the following providers on your designated Care Team at your next follow up: Dr Jules Oar Dr Peder Bourdon Dr. Alwin Baars Dr. Arta Lark Amy Marijane Shoulders, NP Ruddy Corral, [place] [facility] [place], [place] Dennise Fitz, NP [place] Lee, NP Shawnee Dellen, NP Luster Salters, PharmD Bevely Brush, PharmD   Please be sure to bring in all your medications bottles to every appointment.    Thank you for choosing [facility]

## 2023-09-02 NOTE — H&P (View-Only) (Signed)
 We received stat CBC results today showing very low hemoglobin 7.2.  Normal liver test.  Low iron.  Normal B12.  Patient was notified by phone and was sent to the ED.  We will follow-up with patient tomorrow after ED evaluation. Brigitte Canard, PA-C

## 2023-09-02 NOTE — Discharge Instructions (Addendum)
 You received a blood transfusion today for your low hemoglobin. As discussed, please follow-up with your GI provider within the next week for recheck of your hemoglobin and your endoscopy  You were found to have pleural effusions (some fluid in the lungs) on your chest x-ray and CT. Please follow up with your PCP regarding these results within the next week as this could be contributing to your shortness of breath. I have included your CT scan results below for your reference.  Your creatinine was elevated at 1.65. This is a measure of your kidney function. This means your kidneys are not filtering as well as they should be. Please have your PCP continue to monitor this value.  Please return immediately to the ER for any black stools, weakness or dizziness, worsening shortness of breath, any other new or concerning symptoms

## 2023-09-02 NOTE — Progress Notes (Signed)
 We received stat CBC results today showing very low hemoglobin 7.2.  Normal liver test.  Low iron.  Normal B12.  Patient was notified by phone and was sent to the ED.  We will follow-up with patient tomorrow after ED evaluation. Brigitte Canard, PA-C

## 2023-09-02 NOTE — ED Provider Notes (Signed)
  Physical Exam  BP 121/81   Pulse 77   Temp 98.1 F (36.7 C) (Oral)   Resp 15   Wt 98.6 kg   SpO2 100%   BMI 27.91 kg/m   Physical Exam Vitals and nursing note reviewed.  Constitutional:      Appearance: Normal appearance.  HENT:     Head: Atraumatic.  Cardiovascular:     Rate and Rhythm: Normal rate and regular rhythm.  Pulmonary:     Effort: Pulmonary effort is normal.     Breath sounds: Normal breath sounds.     Comments: Talking in full sentences on room air without difficulty Neurological:     General: No focal deficit present.     Mental Status: He is alert.  Psychiatric:        Mood and Affect: Mood normal.        Behavior: Behavior normal.     Procedures  Procedures  ED Course / MDM   Clinical Course as of 09/03/23 0301  Tue Sep 02, 2023  2331 Troponin I (High Sensitivity) [CB]    Clinical Course User Index [CB] Hayes Lipps, PA-C   Medical Decision Making Amount and/or Complexity of Data Reviewed Labs: ordered. Decision-making details documented in ED Course. Radiology: ordered.  Risk Prescription drug management.   Patient received at sign out. In short, patient was found to have a low hemoglobin of 7.2 at his GI appointment earlier today, and sent to the ER for further treatment.  He was endorsing some shortness of breath but no chest pain. States shortness of breath has been normal for him with his "aortic valve problem" and mitral regurgitation. He follows with cardiology Dr. Arlester Ladd for this. Troponin within normal limits, EKG with NSR and no ST changes, no chest pain, no concern for ACS. Chest x-ray without any consolidations concerning for pneumonia. There are bilateral pleural effusions noted which could be contributing to reported shortness of breath. However, he is talking on full sentences on room air, stating 100%. CT negative for PE. No URI symptoms to warrant respiratory panel testing. Shortness of breath could also be due to  anemia.  Waiting blood transfusion at time of sign out.    Upon recheck, patient still well appearing.  Has tolerated transfusion well and feels at baseline.  No adverse reactions. Recommended he stay for further evaluation as to source of bleeding. Patient is requesting discharge home.  I discussed that we still have not identified a source of bleeding. He denies any melanotic stools or bright red blood per rectum.  He does have a history of iron deficiency anemia and receives blood transfusions frequently through the [place].  He states he will call his GI doctor in the morning and set up the EGD they recommended for him for further evaluation. Discussed finding of pleural effusions on chest x-ray, he understands he will follow up with PCP regarding this finding. He remains with stable vitals. Will discharge home with strict return precautions given.       Rexie Catena, PA-C 09/03/23 0301    Mesner, Reymundo Caulk, MD 09/03/23 2302

## 2023-09-02 NOTE — ED Provider Notes (Signed)
  EMERGENCY DEPARTMENT AT [facility] Provider Note   CSN: [id number] Arrival date & time: 09/02/23  1959     History  Chief Complaint  Patient presents with   Abnormal Labs    [name] White is a 88 y.o. male.  HPI Patient is an 88 year old male presents ED today with complaints of low hemoglobin noted earlier by GI today, noting hemoglobin of 7.2.  States that he was regular getting iron transfusions for iron deficiency anemia where he was being seen today for reevaluation for endoscopy in the presence of a positive Hemoccult which was also noted today.  Has stated that he has been getting increasingly short of breath last 3 weeks specifically on exertion.  Currently taking Eliquis  and is noted to not miss any doses.  Denies fever, headache, vision changes, chest pain, abdominal pain, nausea, vomiting, diarrhea, melena, hematochezia, hematemesis, hemoptysis, lower leg edema.  Home Medications Prior to Admission medications   Medication Sig Start Date End Date Taking? Authorizing Provider  acetaminophen  (TYLENOL ) 500 MG tablet Take 500 mg by mouth every 6 (six) hours as needed for moderate pain (pain score 4-6).    [provider]  apixaban  (ELIQUIS ) 5 MG TABS tablet Take 5 mg by mouth 2 (two) times daily. 12/09/22   [provider]  carboxymethylcellulose (REFRESH PLUS) 0.5 % SOLN Place 1 drop into both eyes as needed (dry eyes).    [provider]  cholecalciferol (VITAMIN D3) 25 MCG (1000 UNIT) tablet Take 1,000 Units by mouth at bedtime.    [provider]  CINNAMON PO Take 2,000 mg by mouth 2 (two) times daily.     [provider]  Coenzyme Q10 ([employer] COQ-10) 200 MG capsule Take 200 mg by mouth every evening.     [provider]  empagliflozin (JARDIANCE) 25 MG TABS tablet Take 25 mg by mouth daily.    [provider]  finasteride  (PROSCAR ) 5 MG tablet Take 5 mg by mouth every evening.    [provider]  furosemide  (LASIX ) 20 MG tablet TAKE 2 TABLETS (40 MG TOTAL) BY MOUTH DAILY. 06/04/23   Bensimhon, Rheta Celestine, MD  latanoprost (XALATAN) 0.005 % ophthalmic solution Place 1 drop into both eyes at bedtime.    [provider]  levothyroxine  (SYNTHROID ) 175 MCG tablet Take 175 mcg by mouth daily before breakfast.    [provider]  metFORMIN (GLUCOPHAGE-XR) 500 MG 24 hr tablet Take 500 mg by mouth 2 (two) times daily.    [provider]  mirabegron ER (MYRBETRIQ) 50 MG TB24 tablet Take 50 mg by mouth daily. 01/21/23   [provider]  nilotinib (TASIGNA) 150 MG capsule Take 150 mg by mouth 2 (two) times daily. Take on an empty stomach, 1 hr before or 2 hrs after food.    [provider]  pantoprazole  (PROTONIX ) 40 MG tablet Take 1 tablet (40 mg total) by mouth daily. 09/02/23 08/27/24  Brigitte Canard, PA-C  PARoxetine  (PAXIL ) 40 MG tablet Take 1 tablet (40 mg total) by mouth every morning. 09/14/13   Janece Means, MD  potassium chloride  SA (KLOR-CON  M20) 20 MEQ tablet TAKE 1 TABLET BY MOUTH EVERY DAY 09/23/22   Bensimhon, Rheta Celestine, MD  rosuvastatin  (CRESTOR ) 10 MG tablet TAKE 1 TABLET (10 MG TOTAL) BY MOUTH DAILY. PLEASE SCHEDULE AN APPOINTMENT FOR FURTHER REFILLS 04/14/23   Bensimhon, Rheta Celestine, MD  spironolactone  (ALDACTONE ) 25 MG tablet Take 25 mg by mouth daily.  [provider]  timolol (BETIMOL) 0.5 % ophthalmic solution Place 1 drop into both eyes daily.    [provider]  traZODone  (DESYREL ) 50 MG tablet Take 50 mg by mouth at bedtime.    [provider]  TURMERIC PO Take 1,000 mg by mouth every evening.     [provider]      Allergies    Ace inhibitors, Benazepril , Codeine, Statins, Piroxicam, Pseudoephedrine, Brinzolamide-brimonidine, Dasatinib , and Betadine [povidone iodine ]    Review of Systems   Review of Systems  Respiratory:  Positive for shortness of breath.   All other systems reviewed  and are negative.   Physical Exam Updated Vital Signs BP 116/60   Pulse 79   Temp 97.8 F (36.6 C) (Oral)   Resp 12   Wt 98.6 kg   SpO2 100%   BMI 27.91 kg/m  Physical Exam Vitals and nursing note reviewed.  Constitutional:      General: He is not in acute distress.    Appearance: Normal appearance. He is not ill-appearing.  HENT:     Head: Normocephalic and atraumatic.  Eyes:     General: No scleral icterus.       Right eye: No discharge.        Left eye: No discharge.     Extraocular Movements: Extraocular movements intact.     Conjunctiva/sclera: Conjunctivae normal.     Comments: Noted to have pale conjunctiva bilaterally  Cardiovascular:     Rate and Rhythm: Normal rate and regular rhythm.     Pulses: Normal pulses.     Heart sounds: Murmur (Chronic murmur noted on auscultation) heard.     No friction rub. No gallop.  Pulmonary:     Effort: Pulmonary effort is normal. No respiratory distress.     Breath sounds: Normal breath sounds. No stridor. No wheezing, rhonchi or rales.  Chest:     Chest wall: No tenderness.  Abdominal:     General: Abdomen is flat. There is no distension.     Palpations: Abdomen is soft.     Tenderness: There is no abdominal tenderness. There is no right CVA tenderness, left CVA tenderness, guarding or rebound.  Musculoskeletal:     Cervical back: Normal range of motion and neck supple. No rigidity or tenderness.  Skin:    General: Skin is warm and dry.     Coloration: Skin is pale.  Neurological:     General: No focal deficit present.     Mental Status: He is alert and oriented to person, place, and time. Mental status is at baseline.     Sensory: No sensory deficit.     Motor: No weakness.     Coordination: Coordination normal.  Psychiatric:        Mood and Affect: Mood normal.     ED Results / Procedures / Treatments   Labs (all labs ordered are listed, but only abnormal results are displayed) Labs Reviewed  COMPREHENSIVE  METABOLIC PANEL WITH GFR - Abnormal; Notable for the following components:      Result Value   Sodium 134 (*)    Glucose, Bld 247 (*)    BUN 39 (*)    Creatinine, Ser 1.65 (*)    GFR, Estimated 40 (*)    All other components within normal limits  CBC - Abnormal; Notable for the following components:   RBC 3.08 (*)    Hemoglobin 7.4 (*)    HCT 24.9 (*)    [facility]  24.0 (*)    MCHC 29.7 (*)    RDW 18.8 (*)    All other components within normal limits  PROTIME-INR - Abnormal; Notable for the following components:   Prothrombin Time 16.6 (*)    INR 1.3 (*)    All other components within normal limits  TYPE AND SCREEN  PREPARE RBC (CROSSMATCH)  TROPONIN I (HIGH SENSITIVITY)    EKG None  Radiology CT Angio Abd/Pel W and/or Wo Contrast Result Date: 09/02/2023 CLINICAL DATA:  Lower GI bleeding. EXAM: CTA ABDOMEN AND PELVIS WITHOUT AND WITH CONTRAST TECHNIQUE: Multidetector CT imaging of the abdomen and pelvis was performed using the standard protocol during bolus administration of intravenous contrast. Multiplanar reconstructed images and MIPs were obtained and reviewed to evaluate the vascular anatomy. RADIATION DOSE REDUCTION: This exam was performed according to the departmental dose-optimization program which includes automated exposure control, adjustment of the mA and/or kV according to patient size and/or use of iterative reconstruction technique. CONTRAST:  OMNIPAQUE  IOHEXOL  350 MG/ML SOLN COMPARISON:  CT angiogram chest abdomen and pelvis 03/31/2019. FINDINGS: VASCULAR Aorta: Normal caliber aorta without aneurysm, dissection, vasculitis or significant stenosis. There is calcified atherosclerotic disease throughout the aorta. Celiac: Patent without evidence of aneurysm, dissection, vasculitis or significant stenosis. SMA: Patent without evidence of aneurysm, dissection, vasculitis or significant stenosis. Renals: Both renal arteries are patent without evidence of aneurysm, dissection,  vasculitis, fibromuscular dysplasia or significant stenosis. IMA: Patent without evidence of aneurysm, dissection, vasculitis or significant stenosis. Inflow: Patent without evidence of aneurysm, dissection, vasculitis or significant stenosis. There are atherosclerotic calcifications present. Proximal Outflow: Bilateral common femoral and visualized portions of the superficial and profunda femoral arteries are patent without evidence of aneurysm, dissection, vasculitis or significant stenosis. Veins: No obvious venous abnormality within the limitations of this arterial phase study. Review of the MIP images confirms the above findings. NON-VASCULAR Lower chest: Small left and moderate right pleural effusions are present. There is atelectasis in the lung bases. Hepatobiliary: No focal liver abnormality is seen. No gallstones, gallbladder wall thickening, or biliary dilatation. Pancreas: Unremarkable. No pancreatic ductal dilatation or surrounding inflammatory changes. Spleen: Cysts with peripheral calcifications in the spleen measures 12 mm and has decreased size. Spleen is normal in size. Adrenals/Urinary Tract: Subcentimeter hypodensities in the left kidney are too small to characterize, likely cysts. Otherwise, the adrenal glands, kidneys and bladder are within normal limits. Stomach/Bowel: There is no active gastrointestinal bleeding. Stomach is within normal limits. No evidence of bowel wall thickening, distention, or inflammatory changes. The appendix is not seen. There is sigmoid colon diverticulosis. Small hiatal hernia is present. Lymphatic: No enlarged lymph nodes are seen. Reproductive: Prostate gland is enlarged. Other: No abdominal wall hernia or abnormality. No abdominopelvic ascites. Musculoskeletal: No fracture is seen. IMPRESSION: 1. No evidence for active gastrointestinal bleeding. 2.  No acute localizing process in the abdomen or pelvis. 3. Sigmoid colon diverticulosis. 4. Small left and moderate  right pleural effusions. 5. Prostatomegaly. Aortic Atherosclerosis (ICD10-I70.0). Electronically Signed   By: Tyron Gallon M.D.   On: 09/02/2023 22:31   DG Chest 2 View Result Date: 09/02/2023 CLINICAL DATA:  Shortness of breath EXAM: CHEST - 2 VIEW COMPARISON:  11/08/2020 FINDINGS: Shallow inspiration. Cardiac enlargement with mild vascular congestion. Small bilateral pleural effusions. No airspace disease or consolidation. No pneumothorax. Mediastinal contours appear intact. Calcification of the aorta. Degenerative changes in the spine and shoulders. Cardiac valve prosthesis. IMPRESSION: Cardiac enlargement with mild central vascular congestion and small pleural effusions. No edema  or consolidation. Electronically Signed   By: Boyce Byes M.D.   On: 09/02/2023 22:24    Procedures Procedures    Medications Ordered in ED Medications  0.9 %  sodium chloride  infusion (Manually program via Guardrails IV Fluids) (has no administration in time range)  iohexol  (OMNIPAQUE ) 350 MG/ML injection 100 mL (100 mLs Intravenous Contrast Given 09/02/23 2212)    ED Course/ Medical Decision Making/ A&P Clinical Course as of 09/02/23 2351  Tue Sep 02, 2023  2331 Troponin I (High Sensitivity) [CB]    Clinical Course User Index [CB] Hayes Lipps, PA-C                                 Medical Decision Making Amount and/or Complexity of Data Reviewed Labs: ordered. Decision-making details documented in ED Course. Radiology: ordered.  Risk Prescription drug management.   This patient is a 88 year old male who presents to the ED for concern of hemoglobin of 7.2 noted by GI in the presence of being fecal occult positive, noted to also be short of breath x 3 weeks worse on exertion.   On physical exam, patient is in no acute distress, afebrile, alert and orient x 4, speaking in full sentences, nontachypneic, nontachycardic.  Patient is conjunctiva and mucous membranes noted to be pale bilaterally.   Drained abscess to right heel noted to be well-healing and no sign of infection at this time.  Exam is otherwise unremarkable.  The patient notably dehydrated according to his labs with an elevated creatinine, decreased GFR, elevated BUN and hemoglobin of 7.4 currently, if fluids were to be required, patient would go below the 7.0 threshold for hemoglobin for transfusion.  Therefore we will go ahead and transfuse 1 unit of blood.  And do workup for shortness of breath.  Patient was noted to have pleural effusions on x-ray and on his CT of his abdomen, however when discussing with patient encouraging him to admit, patient was adamant that he not be admitted and would follow-up with his primary care and with GI for the anemia.  With his vital signs stable at this time and patient ambulatory without difficulty and independently, believe this is reasonable however did take great efforts to encourage him to stay.  However patient expressed that he still wished to be discharged after the blood transfusion.  Patient care was then transferred over to [facility].  Awaiting discharge after transfusion and to follow-up with PCP and GI.  Differential diagnoses prior to evaluation: The emergent differential diagnosis includes, but is not limited to, symptomatic anemia, mild dysplastic syndrome, lower GI bleeding, upper GI bleeding, leukemia, ACS, PE, pneumonia, tumor. This is not an exhaustive differential.   Past Medical History / Co-morbidities / Social History: AF, CAD, CHF, severe aortic stenosis, CML, type 2 diabetes, hypothyroidism, status post TAVR, GERD  Additional history: Chart reviewed. Pertinent results include:   Seen today by gastroenterology, for iron deficiency anemia.  Noted to have a hemoglobin of 9 in May.  Noted to have a positive Hemoccult exam today. Referred for repeat EGD and colonoscopy.  Currently on Eliquis .  Denying melena, hematochezia, hematemesis.  Receiving 5 IV iron  infusions  Lab Tests/Imaging studies: I personally interpreted labs/imaging and the pertinent results include:    CBC is notable for a anemia with a hemoglobin of 7.4 and a decreased RBC of 3.08 PT is 16.6 elevated, INR elevated at 1.3. CMP shows an  elevated creatinine of 1.65 and decreased GFR of 40 and elevated BUN of 39. This is mildly increased from 10 hours ago which was noted to have a creatinine 1.51 and GFR 45. Troponin unremarkable Chest x-ray shows pleural effusions CT angio pelvis Shows no evidence of acute gastrointestinal bleeding, sigmoid colon diverticulosis, small left and moderate right pleural effusions and prostamegaly I agree with the radiologist interpretation.    Medications: I ordered medication including 1 unit of RBC.  I have reviewed the patients home medicines and have made adjustments as needed.  Social Determinants of Health:  Good follow up with GI and PCP  Disposition: 11:51 PM Care of [name] White transferred to [facility]  at the end of my shift as the patient will require reassessment once labs/imaging have resulted. Patient presentation, ED course, and plan of care discussed with review of all pertinent labs and imaging. Please see his/her note for further details regarding further ED course and disposition. Plan at time of handoff is await 1 unit of PRBC and then discharge with followup with PCP and GI, with strict return to ER precautions. This may be altered or completely changed at the discretion of the oncoming team pending results of further workup.  Final Clinical Impression(s) / ED Diagnoses Final diagnoses:  Low hemoglobin  Shortness of breath    Rx / DC Orders ED Discharge Orders     None         Vevelyn Gowers 09/02/23 2351    Burnette Carte, MD 09/03/23 [phone]

## 2023-09-02 NOTE — ED Triage Notes (Signed)
 Pt states was called and told to come to ER for HGB of 7.2  Pt is weak and pale and short of breath with exertion  States blood in stool for "quite a while"  Denies pain   Dx with leukemia, gets iron infusions qweekly

## 2023-09-03 ENCOUNTER — Telehealth: Payer: Self-pay | Admitting: Physician Assistant

## 2023-09-03 ENCOUNTER — Ambulatory Visit (HOSPITAL_COMMUNITY): Payer: Self-pay | Admitting: Cardiology

## 2023-09-03 LAB — TYPE AND SCREEN
ABO/RH(D): A POS
Antibody Screen: NEGATIVE
Unit division: 0

## 2023-09-03 LAB — BPAM RBC
Blood Product Expiration Date: 202507022359
ISSUE DATE / TIME: 202506032154
Unit Type and Rh: 6200

## 2023-09-03 NOTE — Telephone Encounter (Signed)
 Patient called and stated that he would like to know when he can schedule for his endoscopy and colonoscopy. Patient was seen yesterday with Brian Campanile, and just wants to make sure if he can go ahead and schedule those procedure.Patient is requesting a call back. Please advise.

## 2023-09-03 NOTE — Telephone Encounter (Signed)
 Patient with diagnosis of afib on Eliquis  for anticoagulation.    Procedure: Colonoscopy and Endoscopy  Date of procedure: TBD   CHA2DS2-VASc Score = 6   This indicates a 9.7% annual risk of stroke. The patient's score is based upon: CHF History: 1 HTN History: 1 Diabetes History: 1 Stroke History: 0 Vascular Disease History: 1 Age Score: 2 Gender Score: 0   CrCl 40 mL/min Platelet count 246  Patient has not  had an Afib/aflutter ablation within the last 3 months or DCCV within the last 30 days  Per office protocol, patient can hold Eliquis  for 2 days prior to procedure.     **This guidance is not considered finalized until pre-operative APP has relayed final recommendations.**

## 2023-09-04 ENCOUNTER — Other Ambulatory Visit: Payer: Self-pay | Admitting: *Deleted

## 2023-09-04 DIAGNOSIS — D500 Iron deficiency anemia secondary to blood loss (chronic): Secondary | ICD-10-CM

## 2023-09-04 DIAGNOSIS — R195 Other fecal abnormalities: Secondary | ICD-10-CM

## 2023-09-04 DIAGNOSIS — K922 Gastrointestinal hemorrhage, unspecified: Secondary | ICD-10-CM

## 2023-09-04 DIAGNOSIS — K921 Melena: Secondary | ICD-10-CM

## 2023-09-04 NOTE — Telephone Encounter (Signed)
 [name] White

## 2023-09-04 NOTE — Telephone Encounter (Signed)
 I have spoken to patient to advise of endoscopy scheduled at [facility] for tomorrow, 09/05/23 at 1045 am, 915 am arrival. Patient says that he did take his Eliquis  this morning at around 6 am. He has been advised that per Dr Elvin Hammer, he should hold Eliquis  at this time until after his procedure tomorrow at which time he will be further advised of recommendations. Patient is advised to hold oral diabetic medications tomorrow. He has been given generalized verbal prep instructions as well. Discussed that a care partner 18 years or older should bring him, stay for the procedure and drive home due to sedation. Written instructions have been made available to the patient for additional review via mychart. Patient verbalizes understanding of all information.

## 2023-09-04 NOTE — Progress Notes (Signed)
 Due to his comorbidities and the fact that he already has an endoscopy slot in the morning, please send this patient to [facility].

## 2023-09-05 ENCOUNTER — Ambulatory Visit (HOSPITAL_COMMUNITY): Admitting: Anesthesiology

## 2023-09-05 ENCOUNTER — Ambulatory Visit (HOSPITAL_COMMUNITY)
Admission: RE | Admit: 2023-09-05 | Discharge: 2023-09-05 | Disposition: A | Discharge status: Home / Self Care | Attending: Internal Medicine | Admitting: Internal Medicine

## 2023-09-05 ENCOUNTER — Encounter (HOSPITAL_COMMUNITY): Payer: Self-pay | Admitting: Internal Medicine

## 2023-09-05 ENCOUNTER — Encounter (HOSPITAL_COMMUNITY)
Admission: RE | Disposition: A | Discharge status: Home / Self Care | Payer: Self-pay | Source: Home / Self Care | Attending: Internal Medicine

## 2023-09-05 ENCOUNTER — Other Ambulatory Visit: Payer: Self-pay

## 2023-09-05 DIAGNOSIS — G4730 Sleep apnea, unspecified: Secondary | ICD-10-CM | POA: Diagnosis not present

## 2023-09-05 DIAGNOSIS — K269 Duodenal ulcer, unspecified as acute or chronic, without hemorrhage or perforation: Secondary | ICD-10-CM

## 2023-09-05 DIAGNOSIS — K921 Melena: Secondary | ICD-10-CM

## 2023-09-05 DIAGNOSIS — I2510 Atherosclerotic heart disease of native coronary artery without angina pectoris: Secondary | ICD-10-CM | POA: Diagnosis not present

## 2023-09-05 DIAGNOSIS — R195 Other fecal abnormalities: Secondary | ICD-10-CM

## 2023-09-05 DIAGNOSIS — J439 Emphysema, unspecified: Secondary | ICD-10-CM | POA: Diagnosis not present

## 2023-09-05 DIAGNOSIS — K2970 Gastritis, unspecified, without bleeding: Secondary | ICD-10-CM | POA: Diagnosis not present

## 2023-09-05 DIAGNOSIS — Z87891 Personal history of nicotine dependence: Secondary | ICD-10-CM | POA: Insufficient documentation

## 2023-09-05 DIAGNOSIS — I110 Hypertensive heart disease with heart failure: Secondary | ICD-10-CM | POA: Insufficient documentation

## 2023-09-05 DIAGNOSIS — I4892 Unspecified atrial flutter: Secondary | ICD-10-CM | POA: Insufficient documentation

## 2023-09-05 DIAGNOSIS — I5032 Chronic diastolic (congestive) heart failure: Secondary | ICD-10-CM | POA: Insufficient documentation

## 2023-09-05 DIAGNOSIS — D62 Acute posthemorrhagic anemia: Secondary | ICD-10-CM

## 2023-09-05 DIAGNOSIS — Z952 Presence of prosthetic heart valve: Secondary | ICD-10-CM | POA: Insufficient documentation

## 2023-09-05 DIAGNOSIS — K922 Gastrointestinal hemorrhage, unspecified: Secondary | ICD-10-CM

## 2023-09-05 DIAGNOSIS — K2100 Gastro-esophageal reflux disease with esophagitis, without bleeding: Secondary | ICD-10-CM | POA: Insufficient documentation

## 2023-09-05 DIAGNOSIS — J449 Chronic obstructive pulmonary disease, unspecified: Secondary | ICD-10-CM | POA: Diagnosis not present

## 2023-09-05 DIAGNOSIS — K259 Gastric ulcer, unspecified as acute or chronic, without hemorrhage or perforation: Secondary | ICD-10-CM | POA: Insufficient documentation

## 2023-09-05 DIAGNOSIS — K449 Diaphragmatic hernia without obstruction or gangrene: Secondary | ICD-10-CM | POA: Diagnosis not present

## 2023-09-05 DIAGNOSIS — K209 Esophagitis, unspecified without bleeding: Secondary | ICD-10-CM

## 2023-09-05 DIAGNOSIS — D500 Iron deficiency anemia secondary to blood loss (chronic): Secondary | ICD-10-CM

## 2023-09-05 DIAGNOSIS — K253 Acute gastric ulcer without hemorrhage or perforation: Secondary | ICD-10-CM

## 2023-09-05 DIAGNOSIS — E119 Type 2 diabetes mellitus without complications: Secondary | ICD-10-CM | POA: Diagnosis not present

## 2023-09-05 HISTORY — PX: ESOPHAGOGASTRODUODENOSCOPY: SHX5428

## 2023-09-05 LAB — POCT I-STAT, CHEM 8
BUN: 32 mg/dL — ABNORMAL HIGH (ref 8–23)
BUN: 48 mg/dL — ABNORMAL HIGH (ref 8–23)
Calcium, Ion: 1.12 mmol/L — ABNORMAL LOW (ref 1.15–1.40)
Calcium, Ion: 1.22 mmol/L (ref 1.15–1.40)
Chloride: 105 mmol/L (ref 98–111)
Chloride: 106 mmol/L (ref 98–111)
Creatinine, Ser: 1.2 mg/dL (ref 0.61–1.24)
Creatinine, Ser: 1.4 mg/dL — ABNORMAL HIGH (ref 0.61–1.24)
Glucose, Bld: 155 mg/dL — ABNORMAL HIGH (ref 70–99)
Glucose, Bld: 162 mg/dL — ABNORMAL HIGH (ref 70–99)
HCT: 28 % — ABNORMAL LOW (ref 39.0–52.0)
HCT: 31 % — ABNORMAL LOW (ref 39.0–52.0)
Hemoglobin: 10.5 g/dL — ABNORMAL LOW (ref 13.0–17.0)
Hemoglobin: 9.5 g/dL — ABNORMAL LOW (ref 13.0–17.0)
Potassium: 4.3 mmol/L (ref 3.5–5.1)
Potassium: 6.2 mmol/L — ABNORMAL HIGH (ref 3.5–5.1)
Sodium: 135 mmol/L (ref 135–145)
Sodium: 138 mmol/L (ref 135–145)
TCO2: 23 mmol/L (ref 22–32)
TCO2: 26 mmol/L (ref 22–32)

## 2023-09-05 LAB — GLUCOSE, CAPILLARY: Glucose-Capillary: 143 mg/dL — ABNORMAL HIGH (ref 70–99)

## 2023-09-05 SURGERY — EGD (ESOPHAGOGASTRODUODENOSCOPY)
Anesthesia: Monitor Anesthesia Care

## 2023-09-05 MED ORDER — SODIUM CHLORIDE 0.9 % IV SOLN: INTRAVENOUS | Status: DC

## 2023-09-05 MED ORDER — PROPOFOL 10 MG/ML IV BOLUS
INTRAVENOUS | Status: DC | PRN
Start: 2023-09-05 — End: 2023-09-05
  Administered 2023-09-05: 10 mg via INTRAVENOUS
  Administered 2023-09-05: 20 mg via INTRAVENOUS
  Administered 2023-09-05: 10 mg via INTRAVENOUS
  Administered 2023-09-05: 20 mg via INTRAVENOUS
  Administered 2023-09-05: 10 mg via INTRAVENOUS
  Administered 2023-09-05 (×3): 20 mg via INTRAVENOUS
  Administered 2023-09-05 (×2): 10 mg via INTRAVENOUS

## 2023-09-05 MED ORDER — EPHEDRINE SULFATE (PRESSORS) 50 MG/ML IJ SOLN
INTRAMUSCULAR | Status: DC | PRN
Start: 2023-09-05 — End: 2023-09-05
  Administered 2023-09-05: 10 mg via INTRAVENOUS

## 2023-09-05 MED ORDER — FENTANYL CITRATE (PF) 100 MCG/2ML IJ SOLN
INTRAMUSCULAR | Status: AC
  Filled 2023-09-05: qty 2

## 2023-09-05 MED ORDER — FENTANYL CITRATE (PF) 100 MCG/2ML IJ SOLN
INTRAMUSCULAR | Status: DC | PRN
  Administered 2023-09-05 (×2): 25 ug via INTRAVENOUS

## 2023-09-05 MED ORDER — LIDOCAINE 2% (20 MG/ML) 5 ML SYRINGE
INTRAMUSCULAR | Status: DC | PRN
  Administered 2023-09-05: 60 mg via INTRAVENOUS

## 2023-09-05 NOTE — Op Note (Signed)
 [facility] Patient Name: [name] White Procedure Date : 09/05/2023 MRN: [id number] Attending MD: Murel Arlington. Elvin Hammer , MD, [id number] Date of Birth: [birth year] CSN: [id number] Age: 88 Admit Type: Inpatient Procedure:                Upper GI endoscopy with biopsies Indications:              Acute post hemorrhagic anemia, Heme positive stool,                            Melena Providers:                Murel Arlington. Elvin Hammer, MD, Lorenzo Romberg, RN, Tyrus Gallus, Technician Referring MD:             Anitra Barn MD Medicines:                Monitored Anesthesia Care Complications:            No immediate complications. Estimated Blood Loss:     Estimated blood loss: none. Procedure:                Pre-Anesthesia Assessment:                           - Prior to the procedure, a History and Physical                            was performed, and patient medications and                            allergies were reviewed. The patient's tolerance of                            previous anesthesia was also reviewed. The risks                            and benefits of the procedure and the sedation                            options and risks were discussed with the patient.                            All questions were answered, and informed consent                            was obtained. Prior Anticoagulants: The patient has                            taken Eliquis  (apixaban ), last dose was 1 day prior                            to procedure. ASA Grade Assessment: III - A patient  with severe systemic disease. After reviewing the                            risks and benefits, the patient was deemed in                            satisfactory condition to undergo the procedure.                           After obtaining informed consent, the endoscope was                            passed under direct vision. Throughout the                             procedure, the patient's blood pressure, pulse, and                            oxygen saturations were monitored continuously. The                            GIF-H190 ([id number]) Olympus endoscope was introduced                            through the mouth, and advanced to the second part                            of duodenum. The upper GI endoscopy was                            accomplished without difficulty. The patient                            tolerated the procedure well. Scope In: Scope Out: Findings:      The esophagus revealed distal esophagitis as manifested by edema and       friability.      The stomach revealed a moderate hiatal hernia. No obvious erosions on       the hernia sac. Multiple superficial nonbleeding antral erosions in the       prepyloric region. Biopsies were taken with a cold forceps for histology       to rule out H. pylori.      The examined revealed a 8 mm linear ulcer, surrounded by edema, at the       bulb D2 junction. No bleeding or stigmata.      The cardia and gastric fundus were normal on retroflexion, save hiatal       hernia. Impression:               1. Nonerosive esophagitis                           2. Moderately large hiatal hernia without erosions                           3. Superficial antral erosions  4. Duodenal bulb ulcer without stigmata. Status                            post biopsies of the gastric antrum to rule out H.                            pylori. Recommendation:           - Patient has a contact number available for                            emergencies. The signs and symptoms of potential                            delayed complications were discussed with the                            patient. Return to normal activities tomorrow.                            Written discharge instructions were provided to the                            patient.                           - Resume  previous diet.                           - Continue present medications.                           - Await pathology results.                           - RESUME Eliquis  today                           - AVOID NSAIDs. These are medications like Advil,                            ibuprofen and Motrin. These can cause ulcers.                           - PLEASE TAKE PANTOPRAZOLE  40 mg DAILY. Take this                            medication daily indefinitely to reduce the risk of                            recurrent bleeding                           Discussed with the patient's wife. I provided her a  copy of this report. They will return to the care                            of Dr. Georgia Kipper Procedure Code(s):        --- Professional ---                           [phone], Esophagogastroduodenoscopy, flexible,                            transoral; with biopsy, single or multiple Diagnosis Code(s):        --- Professional ---                           D62, Acute posthemorrhagic anemia                           R19.5, Other fecal abnormalities                           K92.1, Melena (includes Hematochezia) CPT copyright 2022 American Medical Association. All rights reserved. The codes documented in this report are preliminary and upon coder review may  be revised to meet current compliance requirements. Murel Arlington. Elvin Hammer, MD 09/05/2023 11:01:56 AM This report has been signed electronically. Number of Addenda: 0

## 2023-09-05 NOTE — Anesthesia Preprocedure Evaluation (Signed)
 Anesthesia Evaluation  Patient identified by MRN, date of birth, ID band Patient awake    Reviewed: Allergy & Precautions, NPO status , Patient's Chart, lab work & pertinent test results, reviewed documented beta blocker date and time   History of Anesthesia Complications Negative for: history of anesthetic complications  Airway Mallampati: II  TM Distance: >3 FB     Dental no notable dental hx.    Pulmonary sleep apnea , COPD, former smoker   breath sounds clear to auscultation       Cardiovascular hypertension, + CAD and +CHF  + Valvular Problems/Murmurs MR  Rhythm:Regular Rate:Normal  1. Left ventricular ejection fraction, by estimation, is 50 to 55%. The  left ventricle has low normal function.   2. RVSP mildly elevated but likely underestimates true PA pressures..  Right ventricular systolic function is normal. The right ventricular size  is normal. There is mildly elevated pulmonary artery systolic pressure.   3. Left atrial size was moderately dilated. No left atrial/left atrial  appendage thrombus was detected.   4. Right atrial size was mildly dilated.   5. Severe mitral regurgitation with reversal of flow in the pulmonary  veins. MVA 6.1 cm2. EROA 0.75cm2, MR volume . Severely restricted  posterior leaflet measured at around 1cm. Will refer for assessment of  mTEER.. The mitral valve is abnormal.  Severe mitral valve regurgitation. No evidence of mitral stenosis. The  mean mitral valve gradient is 3.0 mmHg.   6. Tricuspid valve regurgitation is mild to moderate.   7. The aortic valve has been repaired/replaced. Aortic valve  regurgitation is not visualized. 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 Procedure Date:  04/13/19. Echo findings are consistent with normal  structure and function of the aortic valve prosthesis. Aortic valve mean  gradient measures 10.0 mmHg.  Aortic valve Vmax measures 2.14 m/s.   8. There is mild (Grade II) plaque.   9. The left upper pulmonary vein and right upper pulmonary vein are  abnormal.     Neuro/Psych  PSYCHIATRIC DISORDERS Anxiety Depression     Neuromuscular disease    GI/Hepatic hiatal hernia,GERD  ,,  Endo/Other  diabetes, Type 2Hypothyroidism    Renal/GU      Musculoskeletal  (+) Arthritis ,    Abdominal   Peds  Hematology  (+) Blood dyscrasia, anemia   Anesthesia Other Findings   Reproductive/Obstetrics                             Anesthesia Physical Anesthesia Plan  ASA: 4  Anesthesia Plan: MAC   Post-op Pain Management:    Induction: Intravenous  PONV Risk Score and Plan: 1 and Ondansetron   Airway Management Planned: Natural Airway and Nasal Cannula  Additional Equipment:   Intra-op Plan:   Post-operative Plan:   Informed Consent: I have reviewed the patients History and Physical, chart, labs and discussed the procedure including the risks, benefits and alternatives for the proposed anesthesia with the patient or authorized representative who has indicated his/her understanding and acceptance.       Plan Discussed with: CRNA  Anesthesia Plan Comments:        Anesthesia Quick Evaluation

## 2023-09-05 NOTE — Anesthesia Postprocedure Evaluation (Signed)
 Anesthesia Post Note  Patient: Antionne Enrique  Procedure(s) Performed: EGD (ESOPHAGOGASTRODUODENOSCOPY)     Patient location during evaluation: PACU Anesthesia Type: MAC Level of consciousness: awake and alert Pain management: pain level controlled Vital Signs Assessment: post-procedure vital signs reviewed and stable Respiratory status: spontaneous breathing, nonlabored ventilation, respiratory function stable and patient connected to nasal cannula oxygen Cardiovascular status: blood pressure returned to baseline and stable Postop Assessment: no apparent nausea or vomiting Anesthetic complications: no   No notable events documented.  Last Vitals:  Vitals:   09/05/23 1125 09/05/23 1130  BP:  115/71  Pulse: 64 67  Resp: 15 13  Temp:    SpO2: 98% 97%    Last Pain:  Vitals:   09/05/23 1130  TempSrc:   PainSc: 0-No pain                 Leslye Rast

## 2023-09-05 NOTE — Interval H&P Note (Signed)
 History and Physical Interval Note:  09/05/2023 9:30 AM  [name] White  has presented today for surgery, with the diagnosis of ida, gi bleed, melena.  The various methods of treatment have been discussed with the patient and family. After consideration of risks, benefits and other options for treatment, the patient has consented to  Procedure(s): EGD (ESOPHAGOGASTRODUODENOSCOPY) (N/A) as a surgical intervention.  The patient's history has been reviewed, patient examined, no change in status, stable for surgery.  I have reviewed the patient's chart and labs.  Questions were answered to the patient's satisfaction.     Legrand Puma

## 2023-09-05 NOTE — Discharge Instructions (Signed)

## 2023-09-05 NOTE — Transfer of Care (Signed)
 Immediate Anesthesia Transfer of Care Note  Patient: Keanen Dohse  Procedure(s) Performed: EGD (ESOPHAGOGASTRODUODENOSCOPY)  Patient Location: PACU and Endoscopy Unit  Anesthesia Type:MAC  Level of Consciousness: drowsy  Airway & Oxygen Therapy: Patient Spontanous Breathing and Patient connected to face mask oxygen  Post-op Assessment: Report given to RN and Post -op Vital signs reviewed and stable  Post vital signs: Reviewed and stable  Last Vitals:  Vitals Value Taken Time  BP 96/59 09/05/23 1055  Temp 36.3 C 09/05/23 1055  Pulse 64 09/05/23 1057  Resp 15 09/05/23 1057  SpO2 98 % 09/05/23 1057  Vitals shown include unfiled device data.  Last Pain:  Vitals:   09/05/23 1055  TempSrc: Temporal  PainSc: Asleep         Complications: No notable events documented.

## 2023-09-08 ENCOUNTER — Telehealth: Payer: Self-pay | Admitting: Internal Medicine

## 2023-09-08 ENCOUNTER — Ambulatory Visit: Payer: Self-pay | Admitting: Internal Medicine

## 2023-09-08 DIAGNOSIS — C9210 Chronic myeloid leukemia, BCR/ABL-positive, not having achieved remission: Secondary | ICD-10-CM

## 2023-09-08 LAB — SURGICAL PATHOLOGY

## 2023-09-08 NOTE — Telephone Encounter (Signed)
 Copied from CRM [id number]. Topic: Clinical - Lab/Test Results >> Sep 08, 2023  8:25 AM Fonda T wrote: Reason for CRM: Patient calling, stating he would like to speak directly to nurse of provider to discuss the next steps after blood transfusion that was done at [facility] hospital.  Patient can be reached at [phone].

## 2023-09-08 NOTE — Telephone Encounter (Signed)
 Copied from CRM [id number]. Topic: Clinical - Request for Lab/Test Order >> Sep 08, 2023  3:09 PM Alyse July wrote: Reason for CRM: Patient would like to have lab drawn to determine if the blood transfusion help with his blood level. Current blood level before transfusion 7.2. Patient would like to know what his current levels are. CB# [phone]

## 2023-09-08 NOTE — Telephone Encounter (Signed)
 Patient is a moderate risk due to cardiomyopathy and WHO Group II pulmonary hypertension for a moderate risk procedure. No further optimization or workup prior to planned necessary procedure. Would recommend it be done in a hospital setting.

## 2023-09-09 ENCOUNTER — Encounter (HOSPITAL_COMMUNITY): Payer: Self-pay | Admitting: Internal Medicine

## 2023-09-09 ENCOUNTER — Telehealth: Payer: Self-pay | Admitting: Internal Medicine

## 2023-09-09 NOTE — Telephone Encounter (Signed)
 Patient called and stated that he is requesting to speak to Brigitte Canard. Patient did not want to disclose any further information. Please advise.

## 2023-09-10 NOTE — Telephone Encounter (Signed)
   Primary Cardiologist: None  Chart reviewed as part of pre-operative protocol coverage. Per Dr. Alease Amend, Advanced Heart Failure MD, patient is at moderate risk due to cardiomyopathy and WHO Group II pulmonary hypertension for a moderate risk procedure. No further optimization or workup prior to planned procedure necessary. Would recommend procedure be done in hospital setting.  Per office protocol, patient can hold Eliquis  for 2 days prior to procedure.   I will route this recommendation to the requesting party via Epic fax function and remove from pre-op pool.  Please call with questions.  Gerldine Koch, NP-C 09/10/2023, 7:28 AM [address], [address] [place], [place] [address] Office [phone] Fax [phone]

## 2023-09-10 NOTE — Telephone Encounter (Signed)
 I can order the CBC. However, it may be easier to do a CBC through his oncologist's office since we do not do blood transfusions here. Thanks

## 2023-09-10 NOTE — Telephone Encounter (Signed)
 Spoke with the pt and was able to inform him of the providers advice.

## 2023-09-11 ENCOUNTER — Other Ambulatory Visit: Payer: Self-pay

## 2023-09-11 ENCOUNTER — Other Ambulatory Visit (INDEPENDENT_AMBULATORY_CARE_PROVIDER_SITE_OTHER)

## 2023-09-11 DIAGNOSIS — R195 Other fecal abnormalities: Secondary | ICD-10-CM

## 2023-09-11 DIAGNOSIS — D500 Iron deficiency anemia secondary to blood loss (chronic): Secondary | ICD-10-CM

## 2023-09-11 DIAGNOSIS — D649 Anemia, unspecified: Secondary | ICD-10-CM | POA: Diagnosis not present

## 2023-09-11 LAB — CBC WITH DIFFERENTIAL/PLATELET
Basophils Absolute: 0.1 10*3/uL (ref 0.0–0.1)
Basophils Relative: 2.1 % (ref 0.0–3.0)
Eosinophils Absolute: 0.1 10*3/uL (ref 0.0–0.7)
Eosinophils Relative: 2.9 % (ref 0.0–5.0)
HCT: 27.9 % — ABNORMAL LOW (ref 39.0–52.0)
Hemoglobin: 8.6 g/dL — ABNORMAL LOW (ref 13.0–17.0)
Lymphocytes Relative: 26.1 % (ref 12.0–46.0)
Lymphs Abs: 1.3 10*3/uL (ref 0.7–4.0)
MCHC: 30.7 g/dL (ref 30.0–36.0)
MCV: 75.7 fl — ABNORMAL LOW (ref 78.0–100.0)
Monocytes Absolute: 0.5 10*3/uL (ref 0.1–1.0)
Monocytes Relative: 10.2 % (ref 3.0–12.0)
Neutro Abs: 2.9 10*3/uL (ref 1.4–7.7)
Neutrophils Relative %: 58.7 % (ref 43.0–77.0)
Platelets: 248 10*3/uL (ref 150.0–400.0)
RBC: 3.69 Mil/uL — ABNORMAL LOW (ref 4.22–5.81)
RDW: 18.9 % — ABNORMAL HIGH (ref 11.5–15.5)
WBC: 4.9 10*3/uL (ref 4.0–10.5)

## 2023-09-11 NOTE — Telephone Encounter (Signed)
 Spoke with pt and he is aware and will come in for labs, orders in epic.

## 2023-09-11 NOTE — Telephone Encounter (Signed)
 Spoke to patient who says he needed to get a blood transfusion and wanted Brian Campanile to order this. However, states he no longer needs this because he got it done at another facility.   Patient was somewhat difficult to follow in his story line. I think he is discussing the blood transfusion he received at [facility] on 09/03/23.  He states that he has no concerns at this time.

## 2023-09-12 LAB — IBC + FERRITIN
Ferritin: 29.9 ng/mL (ref 22.0–322.0)
Iron: 16 ug/dL — ABNORMAL LOW (ref 42–165)
Saturation Ratios: 3.8 % — ABNORMAL LOW (ref 20.0–50.0)
TIBC: 421.4 ug/dL (ref 250.0–450.0)
Transferrin: 301 mg/dL (ref 212.0–360.0)

## 2023-09-12 LAB — VITAMIN B12: Vitamin B-12: 637 pg/mL (ref 211–911)

## 2023-09-14 ENCOUNTER — Other Ambulatory Visit (HOSPITAL_COMMUNITY): Payer: Self-pay | Admitting: Internal Medicine

## 2023-09-15 ENCOUNTER — Ambulatory Visit: Payer: Self-pay | Admitting: Physician Assistant

## 2023-09-15 DIAGNOSIS — K922 Gastrointestinal hemorrhage, unspecified: Secondary | ICD-10-CM

## 2023-09-15 DIAGNOSIS — D500 Iron deficiency anemia secondary to blood loss (chronic): Secondary | ICD-10-CM

## 2023-09-15 NOTE — Progress Notes (Signed)
 Call and notify patient hemoglobin has dropped from 9.5-8.6 in the past week.  Dr. Elvin Hammer did recent EGD in hospital 09/05/2023 and found stomach and duodenal ulcer.  Also had large hiatal hernia with erosive esophagitis.  I recommend: 1.  Increase pantoprazole  to 40 mg 1 tablet twice daily, #60, 5 refills. 2.  Increase iron tablet Take OTC Slow FE to 1 tablet twice daily with vitamin C. 3.  If patient is having multiple loose black stools or feeling worsening weakness/fatigue, then recommend go to the ED for persistent upper GI bleed. 4.  Avoid all NSAIDs. 5.  Schedule repeat CBC stat. 6.  Schedule follow-up appointment with me this week.  Okay to use work in spot. 7.  If patient is not able to tolerate oral iron, then refer to hematology for IV iron.

## 2023-09-16 ENCOUNTER — Other Ambulatory Visit (INDEPENDENT_AMBULATORY_CARE_PROVIDER_SITE_OTHER)

## 2023-09-16 ENCOUNTER — Ambulatory Visit: Payer: Self-pay | Admitting: Physician Assistant

## 2023-09-16 DIAGNOSIS — K922 Gastrointestinal hemorrhage, unspecified: Secondary | ICD-10-CM

## 2023-09-16 DIAGNOSIS — D500 Iron deficiency anemia secondary to blood loss (chronic): Secondary | ICD-10-CM

## 2023-09-16 LAB — CBC WITH DIFFERENTIAL/PLATELET
Basophils Absolute: 0.1 10*3/uL (ref 0.0–0.1)
Basophils Relative: 1.3 % (ref 0.0–3.0)
Eosinophils Absolute: 0.2 10*3/uL (ref 0.0–0.7)
Eosinophils Relative: 4.2 % (ref 0.0–5.0)
HCT: 26.9 % — ABNORMAL LOW (ref 39.0–52.0)
Hemoglobin: 8.4 g/dL — ABNORMAL LOW (ref 13.0–17.0)
Lymphocytes Relative: 26.7 % (ref 12.0–46.0)
Lymphs Abs: 1.2 10*3/uL (ref 0.7–4.0)
MCHC: 31.3 g/dL (ref 30.0–36.0)
MCV: 75.6 fl — ABNORMAL LOW (ref 78.0–100.0)
Monocytes Absolute: 0.5 10*3/uL (ref 0.1–1.0)
Monocytes Relative: 10.3 % (ref 3.0–12.0)
Neutro Abs: 2.6 10*3/uL (ref 1.4–7.7)
Neutrophils Relative %: 57.5 % (ref 43.0–77.0)
Platelets: 230 10*3/uL (ref 150.0–400.0)
RBC: 3.56 Mil/uL — ABNORMAL LOW (ref 4.22–5.81)
RDW: 18.8 % — ABNORMAL HIGH (ref 11.5–15.5)
WBC: 4.4 10*3/uL (ref 4.0–10.5)

## 2023-09-16 NOTE — Progress Notes (Signed)
 Notify patient hemoglobin is stable.  Hemoglobin dropped from 8.6-8.4 in the past 5 days.  Continue IV iron infusions.  Whomever is giving his iron infusions (hematology / oncology) should monitor his lab work (Hgb / Iron). Brigitte Canard, PA-C

## 2023-09-17 ENCOUNTER — Encounter: Payer: Self-pay | Admitting: Internal Medicine

## 2023-09-17 ENCOUNTER — Ambulatory Visit (INDEPENDENT_AMBULATORY_CARE_PROVIDER_SITE_OTHER): Admitting: Internal Medicine

## 2023-09-17 VITALS — BP 112/62 | HR 76 | Temp 98.5°F | Ht 74.0 in | Wt 217.0 lb

## 2023-09-17 DIAGNOSIS — E039 Hypothyroidism, unspecified: Secondary | ICD-10-CM | POA: Diagnosis not present

## 2023-09-17 DIAGNOSIS — K921 Melena: Secondary | ICD-10-CM

## 2023-09-17 DIAGNOSIS — Z7901 Long term (current) use of anticoagulants: Secondary | ICD-10-CM

## 2023-09-17 DIAGNOSIS — K253 Acute gastric ulcer without hemorrhage or perforation: Secondary | ICD-10-CM

## 2023-09-17 DIAGNOSIS — E611 Iron deficiency: Secondary | ICD-10-CM | POA: Insufficient documentation

## 2023-09-17 DIAGNOSIS — D500 Iron deficiency anemia secondary to blood loss (chronic): Secondary | ICD-10-CM

## 2023-09-17 DIAGNOSIS — Z659 Problem related to unspecified psychosocial circumstances: Secondary | ICD-10-CM | POA: Insufficient documentation

## 2023-09-17 DIAGNOSIS — G4700 Insomnia, unspecified: Secondary | ICD-10-CM | POA: Insufficient documentation

## 2023-09-17 NOTE — Progress Notes (Signed)
 Subjective:  Patient ID: [name] White, male    DOB: June 26, 1935  Age: 88 y.o. MRN: [id number]  CC: Hospitalization Follow-up (Pts wants to discuss mediations and recent health concerns)   HPI [name] White presents for post-hospital f/u for anemia, GI bleeding - melena  He was in the hospital ER - he had a blood transfusion 1 bag.  Dr Elvin Hammer did the upper endoscopy on 09/05/23: ulcers and erosions  He is scheduled to see Brigitte Canard, PA-C in follow up tomorrow   He is on Protonix  40 mg/d  Outpatient Medications Prior to Visit  Medication Sig Dispense Refill  . acetaminophen  (TYLENOL ) 500 MG tablet Take 500 mg by mouth every 6 (six) hours as needed for moderate pain (pain score 4-6).    . apixaban  (ELIQUIS ) 5 MG TABS tablet Take 5 mg by mouth 2 (two) times daily.    . carboxymethylcellulose (REFRESH PLUS) 0.5 % SOLN Place 1 drop into both eyes as needed (dry eyes).    . cholecalciferol (VITAMIN D3) 25 MCG (1000 UNIT) tablet Take 1,000 Units by mouth at bedtime.    Aaron Aas CINNAMON PO Take 2,000 mg by mouth 2 (two) times daily.     . Coenzyme Q10 ([employer] COQ-10) 200 MG capsule Take 200 mg by mouth every evening.     . empagliflozin (JARDIANCE) 25 MG TABS tablet Take 25 mg by mouth daily.    . finasteride  (PROSCAR ) 5 MG tablet Take 5 mg by mouth every evening.    . furosemide  (LASIX ) 20 MG tablet TAKE 2 TABLETS (40 MG TOTAL) BY MOUTH DAILY. 180 tablet 1  . latanoprost (XALATAN) 0.005 % ophthalmic solution Place 1 drop into both eyes at bedtime.    . levothyroxine  (SYNTHROID ) 175 MCG tablet Take 175 mcg by mouth daily before breakfast.    . metFORMIN (GLUCOPHAGE-XR) 500 MG 24 hr tablet Take 500 mg by mouth 2 (two) times daily.    . mirabegron ER (MYRBETRIQ) 50 MG TB24 tablet Take 50 mg by mouth daily.    . nilotinib (TASIGNA) 150 MG capsule Take 150 mg by mouth 2 (two) times daily. Take on an empty stomach, 1 hr before or 2 hrs after food.    . PARoxetine  (PAXIL ) 40 MG tablet Take 1 tablet  (40 mg total) by mouth every morning. 90 tablet 1  . potassium chloride  SA (KLOR-CON  M20) 20 MEQ tablet TAKE 1 TABLET BY MOUTH EVERY DAY 90 tablet 0  . rosuvastatin  (CRESTOR ) 10 MG tablet TAKE 1 TABLET (10 MG TOTAL) BY MOUTH DAILY. PLEASE SCHEDULE AN APPOINTMENT FOR FURTHER REFILLS 90 tablet 1  . spironolactone  (ALDACTONE ) 25 MG tablet Take 25 mg by mouth daily.    . timolol (BETIMOL) 0.5 % ophthalmic solution Place 1 drop into both eyes daily.    . traZODone  (DESYREL ) 50 MG tablet Take 50 mg by mouth at bedtime.    . TURMERIC PO Take 1,000 mg by mouth every evening.      No facility-administered medications prior to visit.    ROS: Review of Systems  Constitutional:  Positive for fatigue. Negative for appetite change and unexpected weight change.  HENT:  Negative for congestion, nosebleeds, sneezing, sore throat and trouble swallowing.   Eyes:  Negative for itching and visual disturbance.  Respiratory:  Negative for cough.   Cardiovascular:  Negative for chest pain, palpitations and leg swelling.  Gastrointestinal:  Negative for abdominal distention, blood in stool, diarrhea and nausea.  Genitourinary:  Negative for  frequency and hematuria.  Musculoskeletal:  Positive for arthralgias, back pain and gait problem. Negative for joint swelling and neck pain.  Skin:  Negative for rash.  Neurological:  Positive for weakness. Negative for dizziness, tremors and speech difficulty.  Psychiatric/Behavioral:  Negative for agitation, dysphoric mood, sleep disturbance and suicidal ideas. The patient is not nervous/anxious.     Objective:  BP 112/62   Pulse 76   Temp 98.5 F (36.9 C) (Oral)   Ht 6' 2 (1.88 m)   Wt 217 lb (98.4 kg)   SpO2 97%   BMI 27.86 kg/m   BP Readings from Last 3 Encounters:  09/17/23 112/62  09/05/23 115/71  09/03/23 115/74    Wt Readings from Last 3 Encounters:  09/17/23 217 lb (98.4 kg)  09/05/23 217 lb 6 oz (98.6 kg)  09/02/23 217 lb 6 oz (98.6 kg)     Physical Exam Constitutional:      General: He is not in acute distress.    Appearance: He is well-developed.     Comments: NAD   Eyes:     Conjunctiva/sclera: Conjunctivae normal.     Pupils: Pupils are equal, round, and reactive to light.   Neck:     Thyroid : No thyromegaly.     Vascular: No JVD.   Cardiovascular:     Rate and Rhythm: Normal rate and regular rhythm.     Heart sounds: Normal heart sounds. No murmur heard.    No friction rub. No gallop.  Pulmonary:     Effort: Pulmonary effort is normal. No respiratory distress.     Breath sounds: Normal breath sounds. No wheezing or rales.  Chest:     Chest wall: No tenderness.  Abdominal:     General: Bowel sounds are normal. There is no distension.     Palpations: Abdomen is soft. There is no mass.     Tenderness: There is no abdominal tenderness. There is no guarding or rebound.   Musculoskeletal:        General: No tenderness. Normal range of motion.     Cervical back: Normal range of motion.  Lymphadenopathy:     Cervical: No cervical adenopathy.   Skin:    General: Skin is warm and dry.     Findings: No rash.   Neurological:     Mental Status: He is alert and oriented to person, place, and time.     Cranial Nerves: No cranial nerve deficit.     Motor: No abnormal muscle tone.     Coordination: Coordination normal.     Gait: Gait normal.     Deep Tendon Reflexes: Reflexes are normal and symmetric.   Psychiatric:        Behavior: Behavior normal.        Thought Content: Thought content normal.        Judgment: Judgment normal.     A total time of 45 minutes was spent preparing to see the patient, reviewing tests, x-rays, operative reports - EGD, and other medical records.  Also, obtaining history and performing comprehensive physical exam.  Additionally, counseling the patient regarding the above listed issues.   Finally, documenting clinical information in the health records, coordination of care,  educating the patient - anemia, GI bleed. It is a complex case.   Lab Results  Component Value Date   WBC 4.4 09/16/2023   HGB 8.4 Repeated and verified X2. (L) 09/16/2023   HCT 26.9 (L) 09/16/2023   PLT 230.0 09/16/2023  GLUCOSE 155 (H) 09/05/2023   CHOL 171 01/22/2018   TRIG 98 01/22/2018   HDL 57 01/22/2018   LDLDIRECT 141.6 11/12/2010   LDLCALC 94 01/22/2018   ALT 17 09/02/2023   AST 29 09/02/2023   NA 138 09/05/2023   K 4.3 09/05/2023   CL 105 09/05/2023   CREATININE 1.20 09/05/2023   BUN 32 (H) 09/05/2023   CO2 22 09/02/2023   TSH 1.25 07/14/2019   PSA 1.42 09/13/2013   INR 1.3 (H) 09/02/2023   HGBA1C 8.1 11/08/2022   MICROALBUR 6.0 (H) 01/10/2017    No results found.  Assessment & Plan:   Problem List Items Addressed This Visit     Hypothyroidism (Chronic)   On levothyroxine        Anemia - Primary    [place] will check w/Dr [facility]) to see if he can get RBC transfusions or iron infusions there      Long term (current) use of anticoagulants   Back on Eliquis       Melena    Dr Elvin Hammer did the upper endoscopy on 09/05/23: ulcers and erosions  He is scheduled to see Brigitte Canard, PA-C in follow up tomorrow       Acute gastric erosion   On protonix  now Dr Elvin Hammer did the upper endoscopy on 09/05/23: ulcers and erosions He is scheduled to see Brigitte Canard, PA-C in follow up tomorrow       Iron deficiency    Maxie was in the hospital ER - he had a blood transfusion 1 bag. Liver, beets, apples are rich in iron   [place] will check w/Dr [facility]) to see if he can get RBC transfusions or iron infusions there       Labs tomorrow if needed  No orders of the defined types were placed in this encounter.     Follow-up: Return in about 6 weeks (around 10/29/2023) for a follow-up visit.  Anitra Barn, MD

## 2023-09-17 NOTE — Assessment & Plan Note (Signed)
 On protonix  now Dr Elvin Hammer did the upper endoscopy on 09/05/23: ulcers and erosions He is scheduled to see Brigitte Canard, PA-C in follow up tomorrow

## 2023-09-17 NOTE — Assessment & Plan Note (Signed)
 Back on Eliquis.

## 2023-09-17 NOTE — Patient Instructions (Signed)
 Beef liver is generally considered the food highest in iron. Other excellent sources of iron include other organ meats, red meat, shellfish (especially clams and oysters), legumes, dark green leafy vegetables, dried fruits, and iron-fortified cereals.

## 2023-09-17 NOTE — Assessment & Plan Note (Signed)
-  On levothyroxine

## 2023-09-17 NOTE — Assessment & Plan Note (Signed)
  Dr Elvin Hammer did the upper endoscopy on 09/05/23: ulcers and erosions  He is scheduled to see Brigitte Canard, PA-C in follow up tomorrow

## 2023-09-17 NOTE — Progress Notes (Signed)
 Brigitte Canard, PA-C [address] [place], [place]  [address] Phone: [phone]   Primary Care Physician: Genia Kettering, MD  Primary Gastroenterologist:  Brigitte Canard, PA-C / Legrand Puma, MD   Chief Complaint:  F/U upper GI bleed, melena, duodenal ulcer, erosive gastritis, and acute blood loss anemia.   HPI:   [name] White is a 88 y.o. male returns for 2-week follow-up of upper GI bleed, melena, duodenal ulcer, gastric ulcer, and acute blood loss anemia.  Currently on Eliquis .  Also on nilotinib for chronic lymphocytic leukemia.  Seeing oncologist Dr. Merilyn Staple through the [place] in [place].  PMH: CHF, A-fib, CAD, HTN, severe aortic stenosis, sleep apnea, GERD, hypothyroid, type 2 diabetes, atrial flutter, leukemia (diagnosed 05/2018), COPD, emphysema, history of total aortic valve replacement. Currently on Eliquis .  Cardiologist Dr. Arlester Ladd.   I last saw him in office 09/02/2023.  Hemoccult positive rectal exam with dark stools.  Hemoglobin 7.2g.  I sent him to [facility] ED.  They transfused 1 unit PRBC and discharged patient home.  Posttransfusion hemoglobin improved to 10.5.  09/05/2023 he underwent urgent EGD ([facility]) by Dr. Elvin Hammer which showed: Distal esophagitis with edema and friability.  Moderate large hiatal hernia without erosions.  Multiple superficial nonbleeding antral erosions in the prepyloric region.  8 mm lineal ulcer in the duodenum.  Patient was instructed to start pantoprazole  40 Mg daily indefinitely.  Also advised to avoid NSAIDs.  Eliquis  was restarted.  Of note, there was concern about drug interaction with his chemotherapy drug for CLL (nilotinib) and Pantoprazole .  Apparently pantoprazole  decreases effectiveness of Nilotinib.  Most recent lab 09/16/2023: Low yet stable hemoglobin 8.4, hematocrit 26, MCV 75, platelets 230, WBC 4.4.  He has been receiving IV iron infusions per hematology/oncology at the [facility] he has been scheduled for IV iron  infusions every Friday for 10 weeks.  He sees Dr. Merilyn Staple at the [facility] once every 3 months for follow-up of CLL.  He is not scheduled for any follow-up lab work at the [place].  Current symptoms: Patient is currently taking pantoprazole  40 Mg twice daily.  He is not seeing any more dark stools.  His energy improved after he received a unit of blood.  He is feeling some better.  Has some decreased memory issues.  Denies current melena, hematochezia, nausea, vomiting, or abdominal pain.  Current Outpatient Medications  Medication Sig Dispense Refill   acetaminophen  (TYLENOL ) 500 MG tablet Take 500 mg by mouth every 6 (six) hours as needed for moderate pain (pain score 4-6).     apixaban  (ELIQUIS ) 5 MG TABS tablet Take 5 mg by mouth 2 (two) times daily.     carboxymethylcellulose (REFRESH PLUS) 0.5 % SOLN Place 1 drop into both eyes as needed (dry eyes).     cholecalciferol (VITAMIN D3) 25 MCG (1000 UNIT) tablet Take 1,000 Units by mouth at bedtime.     CINNAMON PO Take 2,000 mg by mouth 2 (two) times daily.      Coenzyme Q10 ([employer] COQ-10) 200 MG capsule Take 200 mg by mouth every evening.      empagliflozin (JARDIANCE) 25 MG TABS tablet Take 25 mg by mouth daily.     finasteride  (PROSCAR ) 5 MG tablet Take 5 mg by mouth every evening.     furosemide  (LASIX ) 20 MG tablet TAKE 2 TABLETS (40 MG TOTAL) BY MOUTH DAILY. 180 tablet 1   latanoprost (XALATAN) 0.005 % ophthalmic solution Place 1 drop into  both eyes at bedtime.     levothyroxine  (SYNTHROID ) 175 MCG tablet Take 175 mcg by mouth daily before breakfast.     metFORMIN (GLUCOPHAGE-XR) 500 MG 24 hr tablet Take 500 mg by mouth 2 (two) times daily.     mirabegron ER (MYRBETRIQ) 50 MG TB24 tablet Take 50 mg by mouth daily.     nilotinib (TASIGNA) 150 MG capsule Take 150 mg by mouth 2 (two) times daily. Take on an empty stomach, 1 hr before or 2 hrs after food.     PARoxetine  (PAXIL ) 40 MG tablet Take 1 tablet (40 mg total) by mouth every morning. 90 tablet 1    potassium chloride  SA (KLOR-CON  M20) 20 MEQ tablet TAKE 1 TABLET BY MOUTH EVERY DAY 90 tablet 0   rosuvastatin  (CRESTOR ) 10 MG tablet TAKE 1 TABLET (10 MG TOTAL) BY MOUTH DAILY. PLEASE SCHEDULE AN APPOINTMENT FOR FURTHER REFILLS 90 tablet 1   spironolactone  (ALDACTONE ) 25 MG tablet Take 25 mg by mouth daily.     timolol (BETIMOL) 0.5 % ophthalmic solution Place 1 drop into both eyes daily.     traZODone  (DESYREL ) 50 MG tablet Take 50 mg by mouth at bedtime.     TURMERIC PO Take 1,000 mg by mouth every evening.      pantoprazole  (PROTONIX ) 40 MG tablet Take 1 tablet (40 mg total) by mouth 2 (two) times daily before a meal. 60 tablet 1   No current facility-administered medications for this visit.    Allergies as of 09/18/2023 - Review Complete 09/18/2023  Allergen Reaction Noted   Ace inhibitors Anaphylaxis and Swelling 08/26/2013   Benazepril  Anaphylaxis and Swelling 02/01/2014   Codeine Itching and Rash    Statins Other (See Comments)    Piroxicam Hives 07/19/2013   Pseudoephedrine Other (See Comments) 07/19/2013   Brinzolamide-brimonidine  10/01/2021   Dasatinib   04/20/2019   Betadine [povidone iodine ] Hives and Rash 04/15/2019    Past Medical History:  Diagnosis Date   Anemia 06/04/2015   Anxiety    Atrial flutter (HCC)    s/p ablation 07/17/08   Cancer (HCC)    leukemia - dx 05/2018   Chronic diastolic CHF (congestive heart failure) (HCC)    Coronary artery disease    Depression    Diabetes mellitus type 2 in obese 04/10/2009   Qualifier: Diagnosis of  By: Larrie Po MD, Wilmon Hashimoto, diet controlled, lost 60lbs   Emphysema of lung (HCC)    GERD (gastroesophageal reflux disease)    Glaucoma    recent dx - has an appt to be evaluated and meds to treat   H/O hiatal hernia    Hearing loss    some hearing loss in left ear but has not been dx, no hearing aids   Hepatitis    h/o of type A   History of measles as a child    History of mumps as a child    Hypertension     Hypothyroidism    Micturition syncope    Osteoarthritis    Pleural effusion, bilateral    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   26 mm Edwards Sapien 3 transcatheter heart valve placed via percutaneous right transfemoral approach    Severe aortic stenosis    Sleep apnea    uses CPAP nightly   Vitamin D  deficiency 11/24/2015    Past Surgical History:  Procedure Laterality Date   CARDIAC CATHETERIZATION  03/18/2019   CARDIAC ELECTROPHYSIOLOGY MAPPING AND ABLATION  06/2008   CARDIOVERSION  N/A 04/07/2023   Procedure: CARDIOVERSION;  Surgeon: Lauralee Poll, MD;  Location: [facility] INVASIVE CV LAB;  Service: Cardiovascular;  Laterality: N/A;   CATARACT EXTRACTION W/ INTRAOCULAR LENS  IMPLANT, BILATERAL  ~ 2007   COLONOSCOPY     ESOPHAGOGASTRODUODENOSCOPY N/A 09/05/2023   Procedure: EGD (ESOPHAGOGASTRODUODENOSCOPY);  Surgeon: Tobin Forts, MD;  Location[facility] ENDOSCOPY;  Service: Gastroenterology;  Laterality: N/A;   FOOT TENDON SURGERY Left    INTRAOPERATIVE TRANSTHORACIC ECHOCARDIOGRAM N/A 04/13/2019   Procedure: TRANSTHORACIC ECHOCARDIOGRAM;  Surgeon: Arnoldo Lapping, MD;  Location: [facility] OR;  Service: Open Heart Surgery;  Laterality: N/A;   RIGHT/LEFT HEART CATH AND CORONARY ANGIOGRAPHY N/A 03/18/2019   Procedure: RIGHT/LEFT HEART CATH AND CORONARY ANGIOGRAPHY;  Surgeon: Mardell Shade, MD;  Location: [facility] INVASIVE CV LAB;  Service: Cardiovascular;  Laterality: N/A;   RIGHT/LEFT HEART CATH AND CORONARY ANGIOGRAPHY N/A 06/19/2023   Procedure: RIGHT/LEFT HEART CATH AND CORONARY ANGIOGRAPHY;  Surgeon: Arnoldo Lapping, MD;  Location: [facility] INVASIVE CV LAB;  Service: Cardiovascular;  Laterality: N/A;   TONSILLECTOMY  1940's   TOTAL KNEE ARTHROPLASTY  1990's   right   TOTAL KNEE ARTHROPLASTY Left 08/02/2013   Procedure: LEFT TOTAL KNEE ARTHROPLASTY;  Surgeon: Aurther Blue, MD;  Location: [facility] ORS;  Service: Orthopedics;  Laterality: Left;   TRANSCATHETER AORTIC VALVE REPLACEMENT,  TRANSFEMORAL N/A 04/13/2019   Procedure: TRANSCATHETER AORTIC VALVE REPLACEMENT, TRANSFEMORAL;  Surgeon: Arnoldo Lapping, MD;  Location: [facility] OR;  Service: Open Heart Surgery;  Laterality: N/A;   TRANSESOPHAGEAL ECHOCARDIOGRAM (CATH LAB) N/A 04/07/2023   Procedure: TRANSESOPHAGEAL ECHOCARDIOGRAM;  Surgeon: Lauralee Poll, MD;  Location: [facility] INVASIVE CV LAB;  Service: Cardiovascular;  Laterality: N/A;   UPPER GI ENDOSCOPY     WISDOM TOOTH EXTRACTION      Review of Systems:    All systems reviewed and negative except where noted in HPI.    Physical Exam:  BP 118/62   Pulse 78   Ht 6' 2 (1.88 m)   Wt 215 lb (97.5 kg)   BMI 27.60 kg/m  No LMP for male patient.  General: Feeble, elderly male, in no acute distress.  He is sitting in a regular chair today.  Not using a wheelchair or any other assistive devices.  He looks better today than 2 weeks ago. Lungs: Clear to auscultation bilaterally. Non-labored. Heart: Regular rate and rhythm, no murmurs rubs or gallops.  Abdomen: Bowel sounds are normal; Abdomen is Soft; No hepatosplenomegaly, masses or hernias;  No Abdominal Tenderness; No guarding or rebound tenderness. Neuro: Alert and oriented x 3.  Grossly intact.  Mild memory impairment. Psych: Alert and cooperative, normal mood and affect.   Imaging Studies: CT Angio Abd/Pel W and/or Wo Contrast Result Date: 09/02/2023 CLINICAL DATA:  Lower GI bleeding. EXAM: CTA ABDOMEN AND PELVIS WITHOUT AND WITH CONTRAST TECHNIQUE: Multidetector CT imaging of the abdomen and pelvis was performed using the standard protocol during bolus administration of intravenous contrast. Multiplanar reconstructed images and MIPs were obtained and reviewed to evaluate the vascular anatomy. RADIATION DOSE REDUCTION: This exam was performed according to the departmental dose-optimization program which includes automated exposure control, adjustment of the mA and/or kV according to patient size and/or use of iterative  reconstruction technique. CONTRAST:  OMNIPAQUE  IOHEXOL  350 MG/ML SOLN COMPARISON:  CT angiogram chest abdomen and pelvis 03/31/2019. FINDINGS: VASCULAR Aorta: Normal caliber aorta without aneurysm, dissection, vasculitis or significant stenosis. There is calcified atherosclerotic disease throughout the aorta. Celiac: Patent without evidence of aneurysm, dissection, vasculitis  or significant stenosis. SMA: Patent without evidence of aneurysm, dissection, vasculitis or significant stenosis. Renals: Both renal arteries are patent without evidence of aneurysm, dissection, vasculitis, fibromuscular dysplasia or significant stenosis. IMA: Patent without evidence of aneurysm, dissection, vasculitis or significant stenosis. Inflow: Patent without evidence of aneurysm, dissection, vasculitis or significant stenosis. There are atherosclerotic calcifications present. Proximal Outflow: Bilateral common femoral and visualized portions of the superficial and profunda femoral arteries are patent without evidence of aneurysm, dissection, vasculitis or significant stenosis. Veins: No obvious venous abnormality within the limitations of this arterial phase study. Review of the MIP images confirms the above findings. NON-VASCULAR Lower chest: Small left and moderate right pleural effusions are present. There is atelectasis in the lung bases. Hepatobiliary: No focal liver abnormality is seen. No gallstones, gallbladder wall thickening, or biliary dilatation. Pancreas: Unremarkable. No pancreatic ductal dilatation or surrounding inflammatory changes. Spleen: Cysts with peripheral calcifications in the spleen measures 12 mm and has decreased size. Spleen is normal in size. Adrenals/Urinary Tract: Subcentimeter hypodensities in the left kidney are too small to characterize, likely cysts. Otherwise, the adrenal glands, kidneys and bladder are within normal limits. Stomach/Bowel: There is no active gastrointestinal bleeding. Stomach  is within normal limits. No evidence of bowel wall thickening, distention, or inflammatory changes. The appendix is not seen. There is sigmoid colon diverticulosis. Small hiatal hernia is present. Lymphatic: No enlarged lymph nodes are seen. Reproductive: Prostate gland is enlarged. Other: No abdominal wall hernia or abnormality. No abdominopelvic ascites. Musculoskeletal: No fracture is seen. IMPRESSION: 1. No evidence for active gastrointestinal bleeding. 2.  No acute localizing process in the abdomen or pelvis. 3. Sigmoid colon diverticulosis. 4. Small left and moderate right pleural effusions. 5. Prostatomegaly. Aortic Atherosclerosis (ICD10-I70.0). Electronically Signed   By: Tyron Gallon M.D.   On: 09/02/2023 22:31   DG Chest 2 View Result Date: 09/02/2023 CLINICAL DATA:  Shortness of breath EXAM: CHEST - 2 VIEW COMPARISON:  11/08/2020 FINDINGS: Shallow inspiration. Cardiac enlargement with mild vascular congestion. Small bilateral pleural effusions. No airspace disease or consolidation. No pneumothorax. Mediastinal contours appear intact. Calcification of the aorta. Degenerative changes in the spine and shoulders. Cardiac valve prosthesis. IMPRESSION: Cardiac enlargement with mild central vascular congestion and small pleural effusions. No edema or consolidation. Electronically Signed   By: Boyce Byes M.D.   On: 09/02/2023 22:24    Labs: CBC    Component Value Date/Time   WBC 4.4 09/16/2023 1538   RBC 3.56 (L) 09/16/2023 1538   HGB 8.4 Repeated and verified X2. (L) 09/16/2023 1538   HGB 11.3 (L) 06/05/2023 1355   HCT 26.9 (L) 09/16/2023 1538   HCT 36.7 (L) 06/05/2023 1355   PLT 230.0 09/16/2023 1538   PLT 259 06/05/2023 1355   MCV 75.6 (L) 09/16/2023 1538   MCV 82 06/05/2023 1355   MCH 24.0 (L) 09/02/2023 2024   MCHC 31.3 09/16/2023 1538   RDW 18.8 (H) 09/16/2023 1538   RDW 15.4 06/05/2023 1355   LYMPHSABS 1.2 09/16/2023 1538   MONOABS 0.5 09/16/2023 1538   EOSABS 0.2  09/16/2023 1538   BASOSABS 0.1 09/16/2023 1538    CMP     Component Value Date/Time   NA 138 09/05/2023 0852   NA 137 06/05/2023 1355   K 4.3 09/05/2023 0852   CL 105 09/05/2023 0852   CO2 22 09/02/2023 2024   GLUCOSE 155 (H) 09/05/2023 0852   BUN 32 (H) 09/05/2023 0852   BUN 25 06/05/2023 1355   CREATININE 1.20 09/05/2023  4098   CREATININE 1.16 05/26/2015 1428   CALCIUM  9.2 09/02/2023 2024   PROT 7.5 09/02/2023 2024   ALBUMIN 3.7 09/02/2023 2024   AST 29 09/02/2023 2024   ALT 17 09/02/2023 2024   ALKPHOS 54 09/02/2023 2024   BILITOT 0.5 09/02/2023 2024   GFRNONAA 40 (L) 09/02/2023 2024   GFRAA >60 08/16/2019 1015       Assessment and Plan:   [name] White is a 88 y.o. y/o male returns for 2-week follow-up of acute upper GI bleed with melena and acute blood loss anemia due to duodenal ulcer, erosive gastritis, moderate large hiatal hernia.  Also has nonerosive esophagitis.  Currently on Eliquis .  Hemoglobin continues to be low at 8.4.  It is imperative that he remain on pantoprazole  PPI therapy to heal ulcers and prevent upper GI bleeding.  He is currently on nilotinib for CLL followed by oncology at [facility].  Pantoprazole  can potentially decrease effectiveness of nilotinib.  Nilotinib can also increase risk of GI bleeding. I asked patient to discuss this with his oncologist.  Patient would like to see newer oncologist locally closer to his home.  I am referring him to [facility] hematology oncology for further evaluation and treatment of CLL and IDA.  1.  Bleeding duodenal ulcer 2.  Erosive gastritis 3.  Moderately large hiatal hernia without erosions 4.  Nonerosive esophagitis 5.  Acute upper GI bleed with acute blood loss anemia  6.  Long-term use anticoagulants: Eliquis   7.  Chronic lymphocytic leukemia; has been in remission on nilotinib.  8.  Multiple comorbidities:  CLL, CHF, A-fib, CAD, HTN, severe aortic stenosis, sleep apnea, GERD, hypothyroid, type 2  diabetes, atrial flutter, leukemia (diagnosed 05/2018), COPD, emphysema, history of total aortic valve replacement. Currently on Eliquis .  Cardiologist Dr. Arlester Ladd.  Oncologist: Dr. Merilyn Staple [facility]).  Plan: -Repeat lab in 1 week: CBC, iron panel, ferritin. - Continue pantoprazole  40 Mg twice daily for 3 months.   - After 3 months, plan to decrease pantoprazole  to once daily dose.  - Continue IV iron infusions - Monitor hemoglobin and CBC closely - Avoid all NSAIDs - Per patient request, I am referring him to [facility] hematology/oncology for further management and monitoring of CLL and IDA.  This will be closer to his home.  He will also check on [place] referral and [facility] benefits for this.  Brigitte Canard, PA-C  Follow up with Dr. Elvin Hammer in 4 weeks in our office.

## 2023-09-17 NOTE — Assessment & Plan Note (Addendum)
  Kofi will check w/Dr [facility]) to see if he can get RBC transfusions or iron infusions there

## 2023-09-17 NOTE — Assessment & Plan Note (Addendum)
 [name] White was in the hospital ER - he had a blood transfusion 1 bag. Liver, beets, apples are rich in iron   [name] White will check w/Dr [facility]) to see if he can get RBC transfusions or iron infusions there

## 2023-09-17 NOTE — Assessment & Plan Note (Addendum)
  [name] White will check w/Dr [facility]) to see if he can get RBC transfusions or iron infusions there

## 2023-09-18 ENCOUNTER — Encounter: Payer: Self-pay | Admitting: Physician Assistant

## 2023-09-18 ENCOUNTER — Ambulatory Visit: Admitting: Physician Assistant

## 2023-09-18 VITALS — BP 118/62 | HR 78 | Ht 74.0 in | Wt 215.0 lb

## 2023-09-18 DIAGNOSIS — K264 Chronic or unspecified duodenal ulcer with hemorrhage: Secondary | ICD-10-CM | POA: Diagnosis not present

## 2023-09-18 DIAGNOSIS — K449 Diaphragmatic hernia without obstruction or gangrene: Secondary | ICD-10-CM | POA: Diagnosis not present

## 2023-09-18 DIAGNOSIS — K2960 Other gastritis without bleeding: Secondary | ICD-10-CM | POA: Diagnosis not present

## 2023-09-18 DIAGNOSIS — D62 Acute posthemorrhagic anemia: Secondary | ICD-10-CM

## 2023-09-18 DIAGNOSIS — Z7901 Long term (current) use of anticoagulants: Secondary | ICD-10-CM | POA: Diagnosis not present

## 2023-09-18 DIAGNOSIS — C9110 Chronic lymphocytic leukemia of B-cell type not having achieved remission: Secondary | ICD-10-CM

## 2023-09-18 DIAGNOSIS — D500 Iron deficiency anemia secondary to blood loss (chronic): Secondary | ICD-10-CM

## 2023-09-18 DIAGNOSIS — K208 Other esophagitis without bleeding: Secondary | ICD-10-CM

## 2023-09-18 DIAGNOSIS — K922 Gastrointestinal hemorrhage, unspecified: Secondary | ICD-10-CM

## 2023-09-18 DIAGNOSIS — K269 Duodenal ulcer, unspecified as acute or chronic, without hemorrhage or perforation: Secondary | ICD-10-CM

## 2023-09-18 MED ORDER — PANTOPRAZOLE SODIUM 40 MG PO TBEC: 40.0000 mg | DELAYED_RELEASE_TABLET | Freq: Two times a day (BID) | ORAL | 1 refills | Status: DC

## 2023-09-18 NOTE — Patient Instructions (Addendum)
-   Please Continue Pantoprazole  40mg  1 tablet Twice daily.  - Return to our lab in 1 week for blood work.  - Return to our office in 1 month for followup office visit.  - I am referring you to [facility] Hematology / Oncology for Iron Deficiency Anemia and Chronic Lymphocytic Leukemia.  - Avoid all NSAIDS including Ibuprofen, Advil, Aleve , Naproxen .  Brigitte Canard, PA-C    _______________________________________________________  If your blood pressure at your visit was 140/90 or greater, please contact your primary care physician to follow up on this.  _______________________________________________________  If you are age 37 or older, your body mass index should be between 23-30. Your Body mass index is 27.6 kg/m. If this is out of the aforementioned range listed, please consider follow up with your Primary Care Provider.  If you are age 54 or younger, your body mass index should be between 19-25. Your Body mass index is 27.6 kg/m. If this is out of the aformentioned range listed, please consider follow up with your Primary Care Provider.   ________________________________________________________  The [facility] providers would like to encourage you to use MYCHART to communicate with providers for non-urgent requests or questions.  Due to long hold times on the telephone, sending your provider a message by [facility] may be a faster and more efficient way to get a response.  Please allow 48 business hours for a response.  Please remember that this is for non-urgent requests.  _______________________________________________________   It was a pleasure to see you today!  Thank you for trusting me with your gastrointestinal care!

## 2023-09-19 NOTE — Progress Notes (Signed)
 Noted

## 2023-09-24 ENCOUNTER — Other Ambulatory Visit (INDEPENDENT_AMBULATORY_CARE_PROVIDER_SITE_OTHER)

## 2023-09-24 DIAGNOSIS — C9210 Chronic myeloid leukemia, BCR/ABL-positive, not having achieved remission: Secondary | ICD-10-CM

## 2023-09-24 DIAGNOSIS — E1169 Type 2 diabetes mellitus with other specified complication: Secondary | ICD-10-CM

## 2023-09-24 DIAGNOSIS — E669 Obesity, unspecified: Secondary | ICD-10-CM | POA: Diagnosis not present

## 2023-09-24 DIAGNOSIS — E785 Hyperlipidemia, unspecified: Secondary | ICD-10-CM | POA: Diagnosis not present

## 2023-09-24 DIAGNOSIS — E039 Hypothyroidism, unspecified: Secondary | ICD-10-CM | POA: Diagnosis not present

## 2023-09-24 LAB — COMPREHENSIVE METABOLIC PANEL WITH GFR
ALT: 13 U/L (ref 0–53)
AST: 19 U/L (ref 0–37)
Albumin: 4.2 g/dL (ref 3.5–5.2)
Alkaline Phosphatase: 62 U/L (ref 39–117)
BUN: 33 mg/dL — ABNORMAL HIGH (ref 6–23)
CO2: 27 meq/L (ref 19–32)
Calcium: 9.2 mg/dL (ref 8.4–10.5)
Chloride: 100 meq/L (ref 96–112)
Creatinine, Ser: 1.61 mg/dL — ABNORMAL HIGH (ref 0.40–1.50)
GFR: 38.54 mL/min — ABNORMAL LOW (ref >60.00)
Glucose, Bld: 189 mg/dL — ABNORMAL HIGH (ref 70–99)
Potassium: 5 meq/L (ref 3.5–5.1)
Sodium: 135 meq/L (ref 135–145)
Total Bilirubin: 0.5 mg/dL (ref 0.2–1.2)
Total Protein: 7.8 g/dL (ref 6.0–8.3)

## 2023-09-24 LAB — CBC WITH DIFFERENTIAL/PLATELET
Basophils Absolute: 0.1 10*3/uL (ref 0.0–0.1)
Basophils Relative: 2 % (ref 0.0–3.0)
Eosinophils Absolute: 0.2 10*3/uL (ref 0.0–0.7)
Eosinophils Relative: 3.3 % (ref 0.0–5.0)
HCT: 28.8 % — ABNORMAL LOW (ref 39.0–52.0)
Hemoglobin: 8.8 g/dL — ABNORMAL LOW (ref 13.0–17.0)
Lymphocytes Relative: 20.3 % (ref 12.0–46.0)
Lymphs Abs: 1 10*3/uL (ref 0.7–4.0)
MCHC: 30.7 g/dL (ref 30.0–36.0)
MCV: 77 fl — ABNORMAL LOW (ref 78.0–100.0)
Monocytes Absolute: 0.5 10*3/uL (ref 0.1–1.0)
Monocytes Relative: 9 % (ref 3.0–12.0)
Neutro Abs: 3.4 10*3/uL (ref 1.4–7.7)
Neutrophils Relative %: 65.4 % (ref 43.0–77.0)
Platelets: 224 10*3/uL (ref 150.0–400.0)
RBC: 3.74 Mil/uL — ABNORMAL LOW (ref 4.22–5.81)
RDW: 20.5 % — ABNORMAL HIGH (ref 11.5–15.5)
WBC: 5.2 10*3/uL (ref 4.0–10.5)

## 2023-09-24 LAB — URINALYSIS
Bilirubin Urine: NEGATIVE
Hgb urine dipstick: NEGATIVE
Ketones, ur: NEGATIVE
Leukocytes,Ua: NEGATIVE
Nitrite: NEGATIVE
Specific Gravity, Urine: <=1.005 — AB (ref 1.000–1.030)
Total Protein, Urine: NEGATIVE
Urine Glucose: >=1000 — AB
Urobilinogen, UA: 0.2 (ref 0.0–1.0)
pH: 6 (ref 5.0–8.0)

## 2023-09-24 LAB — LIPID PANEL
Cholesterol: 138 mg/dL (ref 0–200)
HDL: 67.3 mg/dL (ref >39.00)
LDL Cholesterol: 62 mg/dL (ref 0–99)
NonHDL: 71.15
Total CHOL/HDL Ratio: 2
Triglycerides: 48 mg/dL (ref 0.0–149.0)
VLDL: 9.6 mg/dL (ref 0.0–40.0)

## 2023-09-24 LAB — TSH: TSH: 0.01 u[IU]/mL — ABNORMAL LOW (ref 0.35–5.50)

## 2023-09-24 LAB — HEMOGLOBIN A1C: Hgb A1c MFr Bld: 7.3 % — ABNORMAL HIGH (ref 4.6–6.5)

## 2023-09-25 ENCOUNTER — Ambulatory Visit: Payer: Self-pay | Admitting: Internal Medicine

## 2023-09-25 NOTE — H&P (Signed)
 Expand All Collapse All       Ellouise Console, PA-C [address] [place], [place]  [address] Phone: [phone]   Gastroenterology Consultation   Referring Provider:     [name], MD Primary Care Physician:  [name], MD Primary Gastroenterologist:  Ellouise Console, PA-C / Norleen Kiang, MD  Reason for Consultation:     Iron deficiency anemia, GI bleeding        HPI:   [name] White is a 88 y.o. y/o male referred for consultation & management  by Plotnikov, Aleksei V, MD. Previous patient of Dr. Aneita.   Patient is referred to evaluate iron deficiency anemia and GI bleeding.  Here to discuss repeat EGD and colonoscopy.  He has history of chronic iron deficiency anemia for many years, recently worsening.  Currently on Eliquis .  Patient denies bright red rectal bleeding, hematemesis, or black tarry stools.  He states his stools have been a little dark.  He is not currently taking oral iron or Pepto-Bismol.  Currently having 1 formed bowel movement every morning.  Denies abdominal pain, dysphagia, nausea, or vomiting.  He admits to occasional heartburn and mild weight loss.  Not eating as much.   He is followed by oncologist for leukemia.  3 months ago it was noted he had worsening iron deficiency.  He received 5 IV iron infusions which helped.  His iron has recently been dropping again.  He admits to weakness and fatigue.  Is in a wheelchair today.  Also walks with a cane.   12/2015 last EGD at [facility] in [place] (to evaluate iron deficiency anemia): 1 cm hiatal hernia, otherwise normal esophagus.  Multiple benign gastric polyps.   12/2015 last Colonoscopy at [facility] in [place] (history of colon polyps, IDA): Sigmoid diverticulosis, otherwise no polyps.  No further colonoscopy recommended due to advanced age.   06/05/2023 labs: Hgb 11.3, HCT 36, MCV 82, platelet 259.  Creatinine 1.41, GFR 49.   07/2023 labs: Hgb 9.0, hematocrit 29, MCV 78.   PMH: CHF, A-fib, CAD, HTN,  severe aortic stenosis, sleep apnea, GERD, hypothyroid, type 2 diabetes, atrial flutter, leukemia (diagnosed 05/2018), COPD, emphysema, history of total aortic valve replacement. Currently on Eliquis .  Cardiologist Dr. Wonda.       Past Medical History:  Diagnosis Date   Anemia 06/04/2015   Anxiety     Atrial flutter (HCC)      s/p ablation 07/17/08   Cancer (HCC)      leukemia - dx 05/2018   Chronic diastolic CHF (congestive heart failure) (HCC)     Coronary artery disease     Depression     Diabetes mellitus type 2 in obese 04/10/2009    Qualifier: Diagnosis of  By: Mavis MD, Norleen BRAVO, diet controlled, lost 60lbs   Emphysema of lung (HCC)     GERD (gastroesophageal reflux disease)     Glaucoma      recent dx - has an appt to be evaluated and meds to treat   H/O hiatal hernia     Hearing loss      some hearing loss in left ear but has not been dx, no hearing aids   Hepatitis      h/o of type A   History of measles as a child     History of mumps as a child     Hypertension     Hypothyroidism     Micturition syncope     Osteoarthritis  Pleural effusion, bilateral     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    26 mm Edwards Sapien 3 transcatheter heart valve placed via percutaneous right transfemoral approach    Severe aortic stenosis     Sleep apnea      uses CPAP nightly   Vitamin D  deficiency 11/24/2015               Past Surgical History:  Procedure Laterality Date   CARDIAC CATHETERIZATION   03/18/2019   CARDIAC ELECTROPHYSIOLOGY MAPPING AND ABLATION   06/2008   CARDIOVERSION N/A 04/07/2023    Procedure: CARDIOVERSION;  Surgeon: Zenaida Morene PARAS, MD;  Location: [facility] INVASIVE CV LAB;  Service: Cardiovascular;  Laterality: N/A;   CATARACT EXTRACTION W/ INTRAOCULAR LENS  IMPLANT, BILATERAL   ~ 2007   COLONOSCOPY       FOOT TENDON SURGERY Left     INTRAOPERATIVE TRANSTHORACIC ECHOCARDIOGRAM N/A 04/13/2019    Procedure: TRANSTHORACIC  ECHOCARDIOGRAM;  Surgeon: Wonda Sharper, MD;  Location: [facility] OR;  Service: Open Heart Surgery;  Laterality: N/A;   RIGHT/LEFT HEART CATH AND CORONARY ANGIOGRAPHY N/A 03/18/2019    Procedure: RIGHT/LEFT HEART CATH AND CORONARY ANGIOGRAPHY;  Surgeon: Cherrie Toribio SAUNDERS, MD;  Location: [facility] INVASIVE CV [facility];  Service: Cardiovascular;  Laterality: N/A;   RIGHT/LEFT HEART CATH AND CORONARY ANGIOGRAPHY N/A 06/19/2023    Procedure: RIGHT/LEFT HEART CATH AND CORONARY ANGIOGRAPHY;  Surgeon: Wonda Sharper, MD;  Location: [facility] INVASIVE CV LAB;  Service: Cardiovascular;  Laterality: N/A;   TONSILLECTOMY   1940's   TOTAL KNEE ARTHROPLASTY   1990's    right   TOTAL KNEE ARTHROPLASTY Left 08/02/2013    Procedure: LEFT TOTAL KNEE ARTHROPLASTY;  Surgeon: Dempsey LULLA Moan, MD;  Location: [facility] ORS;  Service: Orthopedics;  Laterality: Left;   TRANSCATHETER AORTIC VALVE REPLACEMENT, TRANSFEMORAL N/A 04/13/2019    Procedure: TRANSCATHETER AORTIC VALVE REPLACEMENT, TRANSFEMORAL;  Surgeon: Wonda Sharper, MD;  Location: [facility] OR;  Service: Open Heart Surgery;  Laterality: N/A;   TRANSESOPHAGEAL ECHOCARDIOGRAM (CATH LAB) N/A 04/07/2023    Procedure: TRANSESOPHAGEAL ECHOCARDIOGRAM;  Surgeon: Zenaida Morene PARAS, MD;  Location: [facility] INVASIVE CV LAB;  Service: Cardiovascular;  Laterality: N/A;   UPPER GI ENDOSCOPY       WISDOM TOOTH EXTRACTION                     Prior to Admission medications   Medication Sig Start Date End Date Taking? Authorizing Provider  acetaminophen  (TYLENOL ) 500 MG tablet Take 500 mg by mouth every 6 (six) hours as needed for moderate pain (pain score 4-6).       [provider]  amLODipine  (NORVASC ) 10 MG tablet Take 10 mg by mouth daily. 05/25/21     [provider]  amoxicillin  (AMOXIL ) 500 MG tablet Take 4 capsules (2,000 mg) one hour prior to all dental visits. 04/21/19     Sebastian Lamarr SAUNDERS, PA-C  apixaban  (ELIQUIS ) 5 MG TABS tablet Take 5 mg by mouth 2 (two) times daily. 12/09/22      [provider]  carboxymethylcellulose (REFRESH PLUS) 0.5 % SOLN Place 1 drop into both eyes as needed (dry eyes).       [provider]  cholecalciferol (VITAMIN D3) 25 MCG (1000 UNIT) tablet Take 1,000 Units by mouth at bedtime.       [provider]  CINNAMON PO Take 2,000 mg by mouth 2 (two) times daily.        [provider]  Coenzyme Q10 ([employer] COQ-10) 200 MG capsule Take 200 mg by mouth every evening.        [provider]  empagliflozin (JARDIANCE) 25 MG TABS tablet Take 25 mg by mouth daily.       [provider]  finasteride  (PROSCAR ) 5 MG tablet Take 5 mg by mouth every evening.       [provider]  furosemide  (LASIX ) 20 MG tablet TAKE 2 TABLETS (40 MG TOTAL) BY MOUTH DAILY. 06/04/23     Bensimhon, Toribio SAUNDERS, MD  latanoprost (XALATAN) 0.005 % ophthalmic solution Place 1 drop into both eyes at bedtime.       [provider]  levothyroxine  (SYNTHROID ) 200 MCG tablet Take 200 mcg by mouth daily before breakfast.       [provider]  Melatonin 10 MG CAPS Take 10 mg by mouth at bedtime as needed (sleep).       [provider]  metFORMIN (GLUCOPHAGE-XR) 500 MG 24 hr tablet Take 500 mg by mouth 2 (two) times daily.       [provider]  mirabegron ER (MYRBETRIQ) 50 MG TB24 tablet Take 50 mg by mouth daily. 01/21/23     [provider]  nilotinib (TASIGNA) 150 MG capsule Take 150 mg by mouth 2 (two) times daily. Take on an empty stomach, 1 hr before or 2 hrs after food.       [provider]  PARoxetine  (PAXIL ) 40 MG tablet Take 1 tablet (40 mg total) by mouth every morning. 09/14/13     Mavis Norleen BRAVO, MD  potassium chloride  SA (KLOR-CON  M20) 20 MEQ tablet TAKE 1 TABLET BY MOUTH EVERY DAY 09/23/22     Bensimhon, Toribio SAUNDERS, MD  rosuvastatin  (CRESTOR ) 10 MG tablet TAKE 1 TABLET (10 MG TOTAL) BY MOUTH DAILY. PLEASE SCHEDULE AN APPOINTMENT FOR FURTHER REFILLS 04/14/23     Bensimhon,  Toribio SAUNDERS, MD  spironolactone  (ALDACTONE ) 25 MG tablet Take 25 mg by mouth daily.       [provider]  timolol (BETIMOL) 0.5 % ophthalmic solution Place 1 drop into both eyes daily.       [provider]  traZODone  (DESYREL ) 50 MG tablet Take 50 mg by mouth at bedtime.       [provider]  TURMERIC PO Take 1,000 mg by mouth every evening.        [provider]           Family History  Problem Relation Age of Onset   Multiple sclerosis Father     Heart disease Mother          atrial fibrillation   Hypertension Son     Heart disease Maternal Grandfather            Social History  Social History         Tobacco Use   Smoking status: Former      Types: Pipe, Cigars      Quit date: 04/01/1996      Years since quitting: 27.4   Smokeless tobacco: Former      Types: Chew      Quit date: 04/01/2009  Vaping Use   Vaping status: Never Used  Substance Use Topics   Alcohol use: Not Currently      Alcohol/week: 0.0 standard drinks of alcohol      Comment: Quit wine early 2020.   Drug use: No  Allergies as of 09/02/2023 - Review Complete 09/02/2023  Allergen Reaction Noted   Ace inhibitors Anaphylaxis and Swelling 08/26/2013   Benazepril  Anaphylaxis and Swelling 02/01/2014   Codeine Itching and Rash     Statins Other (See Comments)     Piroxicam Hives 07/19/2013   Pseudoephedrine Other (See Comments) 07/19/2013   Brinzolamide-brimonidine   10/01/2021   Dasatinib    04/20/2019   Betadine [povidone iodine ] Hives and Rash 04/15/2019      Review of Systems:    All systems reviewed and negative except where noted in HPI.    Physical Exam:  BP 102/62   Pulse 86   Ht 6' 2 (1.88 m)   Wt 217 lb 9 oz (98.7 kg)   SpO2 97%   BMI 27.93 kg/m  No LMP for male patient.   General:   Feeble, elderly male, pleasant and cooperative in NAD. Sitting in a wheelchair.  Also walks with a cane.  He was able to get onto exam table with   Lungs:  Respirations even and unlabored.  Clear throughout to auscultation.   No wheezes, crackles, or rhonchi. No acute distress. Heart:  Regular rate and rhythm; no murmurs, clicks, rubs, or gallops. Abdomen:  Normal bowel sounds.  No bruits.  Soft, and non-distended without masses, hepatosplenomegaly or hernias noted.  No Tenderness.  No guarding or rebound tenderness.    Rectal: No external hemorrhoids or lesions.  Stool is dark brown and heme Positive.  No rectal masses or tenderness.  No gross blood. Neurologic:  Alert and oriented x3;  grossly normal neurologically. Psych:  Alert and cooperative. Normal mood and affect.   Chaperone for Exam:  Dorothe Pouch, CMA   Imaging Studies:  Imaging Results  DG Foot Complete Left Result Date: 08/07/2023 Please see detailed radiograph report in office note.      Labs: CBC Labs (Brief)          Component Value Date/Time    WBC 4.4 09/02/2023 1453    RBC 3.00 (L) 09/02/2023 1453    HGB 7.2 cL (LL) 09/02/2023 1453    HGB 11.3 (L) 06/05/2023 1355    HCT 23.0 cL (LL) 09/02/2023 1453    HCT 36.7 (L) 06/05/2023 1355    PLT 226.0 09/02/2023 1453    PLT 259 06/05/2023 1355    MCV 76.8 (L) 09/02/2023 1453    MCV 82 06/05/2023 1355    MCH 25.3 (L) 06/05/2023 1355    MCH 26.0 03/25/2023 1103    MCHC 31.3 09/02/2023 1453    RDW 18.9 (H) 09/02/2023 1453    RDW 15.4 06/05/2023 1355    LYMPHSABS 0.8 09/02/2023 1453    MONOABS 0.5 09/02/2023 1453    EOSABS 0.1 09/02/2023 1453    BASOSABS 0.0 09/02/2023 1453        CMP     Labs (Brief)          Component Value Date/Time    NA 136 09/02/2023 1035    NA 137 06/05/2023 1355    K 4.9 09/02/2023 1035    CL 103 09/02/2023 1035    CO2 23 09/02/2023 1035    GLUCOSE 191 (H) 09/02/2023 1035    BUN 36 (H) 09/02/2023 1035    BUN 25 06/05/2023 1355    CREATININE 1.51 (H) 09/02/2023 1035    CREATININE 1.16 05/26/2015 1428    CALCIUM  9.3 09/02/2023 1035    PROT 7.3 09/02/2023 1453     ALBUMIN 4.0 09/02/2023 1453  AST 19 09/02/2023 1453    ALT 13 09/02/2023 1453    ALKPHOS 60 09/02/2023 1453    BILITOT 0.4 09/02/2023 1453    GFRNONAA 45 (L) 09/02/2023 1035    GFRAA >60 08/16/2019 1015        Assessment and Plan:    [name] White is a 88 y.o. y/o male has been referred for:   1.  Chronic iron deficiency anemia, recently worsened - Stat Labs: CBC, iron panel, ferritin, B12   2.  GI Bleeding - Hemoccult Exam Positive Today.   3.  Weakness / Fatigue   4.  Multiple comorbidities: CHF, A-fib, CAD, HTN, severe aortic stenosis, sleep apnea, GERD, hypothyroid, type 2 diabetes, atrial flutter, leukemia (diagnosed 05/2018), COPD, emphysema, history of total aortic valve replacement.  - Currently on Eliquis .   NOTE: Patient's lab results returned after his office visit.  Hemoglobin 7.2G.  We called and notified patient and his wife to take him to the ED today, and they agreed.   Follow up will be based on hospital recommendations.   Ellouise Console, PA-C     Recent H&P as above.  Now for EGD

## 2023-10-01 ENCOUNTER — Inpatient Hospital Stay: Attending: Hematology and Oncology | Admitting: Hematology and Oncology

## 2023-10-01 ENCOUNTER — Inpatient Hospital Stay

## 2023-10-01 VITALS — BP 123/71 | HR 77 | Temp 97.9°F | Resp 14 | Wt 217.0 lb

## 2023-10-01 DIAGNOSIS — K922 Gastrointestinal hemorrhage, unspecified: Secondary | ICD-10-CM | POA: Diagnosis not present

## 2023-10-01 DIAGNOSIS — I110 Hypertensive heart disease with heart failure: Secondary | ICD-10-CM | POA: Insufficient documentation

## 2023-10-01 DIAGNOSIS — Z8249 Family history of ischemic heart disease and other diseases of the circulatory system: Secondary | ICD-10-CM | POA: Diagnosis not present

## 2023-10-01 DIAGNOSIS — I5032 Chronic diastolic (congestive) heart failure: Secondary | ICD-10-CM | POA: Diagnosis not present

## 2023-10-01 DIAGNOSIS — Z888 Allergy status to other drugs, medicaments and biological substances status: Secondary | ICD-10-CM | POA: Insufficient documentation

## 2023-10-01 DIAGNOSIS — K219 Gastro-esophageal reflux disease without esophagitis: Secondary | ICD-10-CM | POA: Insufficient documentation

## 2023-10-01 DIAGNOSIS — C9210 Chronic myeloid leukemia, BCR/ABL-positive, not having achieved remission: Secondary | ICD-10-CM | POA: Diagnosis not present

## 2023-10-01 DIAGNOSIS — I2510 Atherosclerotic heart disease of native coronary artery without angina pectoris: Secondary | ICD-10-CM | POA: Insufficient documentation

## 2023-10-01 DIAGNOSIS — D500 Iron deficiency anemia secondary to blood loss (chronic): Secondary | ICD-10-CM | POA: Diagnosis not present

## 2023-10-01 DIAGNOSIS — Z8269 Family history of other diseases of the musculoskeletal system and connective tissue: Secondary | ICD-10-CM | POA: Insufficient documentation

## 2023-10-01 DIAGNOSIS — Z7901 Long term (current) use of anticoagulants: Secondary | ICD-10-CM | POA: Diagnosis not present

## 2023-10-01 DIAGNOSIS — Z79890 Hormone replacement therapy (postmenopausal): Secondary | ICD-10-CM | POA: Insufficient documentation

## 2023-10-01 DIAGNOSIS — E039 Hypothyroidism, unspecified: Secondary | ICD-10-CM | POA: Insufficient documentation

## 2023-10-01 DIAGNOSIS — E119 Type 2 diabetes mellitus without complications: Secondary | ICD-10-CM | POA: Diagnosis not present

## 2023-10-01 DIAGNOSIS — G4730 Sleep apnea, unspecified: Secondary | ICD-10-CM | POA: Insufficient documentation

## 2023-10-01 DIAGNOSIS — Z9089 Acquired absence of other organs: Secondary | ICD-10-CM | POA: Insufficient documentation

## 2023-10-01 DIAGNOSIS — Z885 Allergy status to narcotic agent status: Secondary | ICD-10-CM | POA: Diagnosis not present

## 2023-10-01 DIAGNOSIS — Z79899 Other long term (current) drug therapy: Secondary | ICD-10-CM | POA: Diagnosis not present

## 2023-10-01 DIAGNOSIS — Z87891 Personal history of nicotine dependence: Secondary | ICD-10-CM | POA: Diagnosis not present

## 2023-10-01 LAB — RETIC PANEL
Immature Retic Fract: 25.5 % — ABNORMAL HIGH (ref 2.3–15.9)
RBC.: 3.74 MIL/uL — ABNORMAL LOW (ref 4.22–5.81)
Retic Count, Absolute: 128.7 K/uL (ref 19.0–186.0)
Retic Ct Pct: 3.4 % — ABNORMAL HIGH (ref 0.4–3.1)
Reticulocyte Hemoglobin: 24.9 pg — ABNORMAL LOW

## 2023-10-01 LAB — CBC WITH DIFFERENTIAL (CANCER CENTER ONLY)
Abs Immature Granulocytes: 0.02 K/uL (ref 0.00–0.07)
Basophils Absolute: 0.1 K/uL (ref 0.0–0.1)
Basophils Relative: 1 %
Eosinophils Absolute: 0.1 K/uL (ref 0.0–0.5)
Eosinophils Relative: 2 %
HCT: 29.3 % — ABNORMAL LOW (ref 39.0–52.0)
Hemoglobin: 8.9 g/dL — ABNORMAL LOW (ref 13.0–17.0)
Immature Granulocytes: 0 %
Lymphocytes Relative: 14 %
Lymphs Abs: 1 K/uL (ref 0.7–4.0)
MCH: 23.9 pg — ABNORMAL LOW (ref 26.0–34.0)
MCHC: 30.4 g/dL (ref 30.0–36.0)
MCV: 78.8 fL — ABNORMAL LOW (ref 80.0–100.0)
Monocytes Absolute: 0.5 K/uL (ref 0.1–1.0)
Monocytes Relative: 7 %
Neutro Abs: 5.2 K/uL (ref 1.7–7.7)
Neutrophils Relative %: 76 %
Platelet Count: 252 K/uL (ref 150–400)
RBC: 3.72 MIL/uL — ABNORMAL LOW (ref 4.22–5.81)
RDW: 21.3 % — ABNORMAL HIGH (ref 11.5–15.5)
WBC Count: 6.9 K/uL (ref 4.0–10.5)
nRBC: 0 % (ref 0.0–0.2)

## 2023-10-01 LAB — CMP (CANCER CENTER ONLY)
ALT: 13 U/L (ref 0–44)
AST: 22 U/L (ref 15–41)
Albumin: 4.5 g/dL (ref 3.5–5.0)
Alkaline Phosphatase: 70 U/L (ref 38–126)
Anion gap: 8 (ref 5–15)
BUN: 31 mg/dL — ABNORMAL HIGH (ref 8–23)
CO2: 27 mmol/L (ref 22–32)
Calcium: 9.7 mg/dL (ref 8.9–10.3)
Chloride: 102 mmol/L (ref 98–111)
Creatinine: 1.52 mg/dL — ABNORMAL HIGH (ref 0.61–1.24)
GFR, Estimated: 44 mL/min — ABNORMAL LOW (ref >60)
Glucose, Bld: 188 mg/dL — ABNORMAL HIGH (ref 70–99)
Potassium: 4.7 mmol/L (ref 3.5–5.1)
Sodium: 137 mmol/L (ref 135–145)
Total Bilirubin: 0.5 mg/dL (ref 0.0–1.2)
Total Protein: 8.5 g/dL — ABNORMAL HIGH (ref 6.5–8.1)

## 2023-10-01 LAB — LACTATE DEHYDROGENASE: LDH: 187 U/L (ref 98–192)

## 2023-10-01 LAB — IRON AND IRON BINDING CAPACITY (CC-WL,HP ONLY)
Iron: 22 ug/dL — ABNORMAL LOW (ref 45–182)
Saturation Ratios: 5 % — ABNORMAL LOW (ref 17.9–39.5)
TIBC: 416 ug/dL (ref 250–450)
UIBC: 394 ug/dL — ABNORMAL HIGH (ref 117–376)

## 2023-10-01 LAB — FERRITIN: Ferritin: 131 ng/mL (ref 24–336)

## 2023-10-01 NOTE — Progress Notes (Signed)
 [facility] [phone]   [phone]  INITIAL CONSULT NOTE  Patient Care Team: Plotnikov, Karlynn GAILS, MD as PCP - General (Internal Medicine) Bensimhon, Toribio SAUNDERS, MD as PCP - Advanced Heart Failure (Cardiology) Vertie Dale CROME, OD as Consulting Physician (Optometry) Bensimhon, Toribio SAUNDERS, MD as Consulting Physician (Cardiology) Melodi Lerner, MD as Consulting Physician (Orthopedic Surgery) Ubaldo Kiang, MD as Consulting Physician (Cardiology) Polly Snellen, MD as Consulting Physician (Family Medicine) Vicenta Sewer, DDS (Dentistry) Wonda Sharper, MD as Consulting Physician (Cardiology) Waddell Danelle ORN, MD as Consulting Physician (Cardiology)  Hematological/Oncological History # Chronic Myeloid Leukemia # Iron Deficiency Anemia  CHIEF COMPLAINTS/PURPOSE OF CONSULTATION:  History of CML and iron deficiency anemia  HISTORY OF PRESENTING ILLNESS:  [name] White 88 y.o. male with medical history significant for atrial flutter, congestive heart failure, CAD, type 2 diabetes, GERD, hypothyroidism, hypertension, and sleep apnea on CPAP who presents for evaluation of CML and IDA.  On review of the previous records [name] White follows at the [facility].  There he is being followed by Dr. Kennedy who is managing his nilotinib for his CML and iron infusions.  He has an upcoming iron infusion later this week.  [name] White also follows with Dr. Garald and Dr. Kiang in the [facility].  He was referred to our office for evaluation and management of his CML and IDA.  On exam today [name] White reports that he has been tolerating his CML medication well with no major difficulties.  He reports that he has required frequent IV iron infusions and is currently 4 out of 5 infusions into his current cycle of Venofer.  He reports that he reports that his leukemia is under good control.  He is having difficulty with periodic GI bleeding for which he follows  with Dr. Kiang.  He notes that he was previously on Sprycel  therapy but this was discontinued due to issues with fluid retention.  He notes that he does occasionally have breakouts of pimples on his head as a result of his nilotinib but otherwise tolerates it quite well.  He notes that he is also on Eliquis  therapy because of his heart valve.  He has had a TAVR before in the past.  He notes that overall he is satisfied with his care with Dr. Kennedy and the care that he gets at the [facility].  On further discussion [name] White reports that he is a former smoker having quit in the 1990s.  He notes that he does not drink alcohol.  He notes that he worked in the [employer] and was in [place] during the [place] era.  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  Otherwise he denies any fevers, chills, sweats, nausea, vomiting or diarrhea.  A full 10 point ROS is otherwise negative.   MEDICAL HISTORY:  Past Medical History:  Diagnosis Date   Anemia 06/04/2015   Anxiety    Atrial flutter (HCC)    s/p ablation 07/17/08   Cancer (HCC)    leukemia - dx 05/2018   Chronic diastolic CHF (congestive heart failure) (HCC)    Coronary artery disease    Depression    Diabetes mellitus type 2 in obese 04/10/2009   Qualifier: Diagnosis of  By: Mavis MD, Norleen BRAVO, diet controlled, lost 60lbs   Emphysema of lung (HCC)    GERD (gastroesophageal reflux disease)    Glaucoma    recent dx - has an appt  to be evaluated and meds to treat   H/O hiatal hernia    Hearing loss    some hearing loss in left ear but has not been dx, no hearing aids   Hepatitis    h/o of type A   History of measles as a child    History of mumps as a child    Hypertension    Hypothyroidism    Micturition syncope    Osteoarthritis    Pleural effusion, bilateral    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   26 mm Edwards Sapien 3 transcatheter heart valve placed via percutaneous right  transfemoral approach    Severe aortic stenosis    Sleep apnea    uses CPAP nightly   Vitamin D  deficiency 11/24/2015    SURGICAL HISTORY: Past Surgical History:  Procedure Laterality Date   CARDIAC CATHETERIZATION  03/18/2019   CARDIAC ELECTROPHYSIOLOGY MAPPING AND ABLATION  06/2008   CARDIOVERSION N/A 04/07/2023   Procedure: CARDIOVERSION;  Surgeon: Zenaida Morene PARAS, MD;  Location: [facility] INVASIVE CV LAB;  Service: Cardiovascular;  Laterality: N/A;   CATARACT EXTRACTION W/ INTRAOCULAR LENS  IMPLANT, BILATERAL  ~ 2007   COLONOSCOPY     ESOPHAGOGASTRODUODENOSCOPY N/A 09/05/2023   Procedure: EGD (ESOPHAGOGASTRODUODENOSCOPY);  Surgeon: Abran Norleen SAILOR, MD;  Location: [facility] ENDOSCOPY;  Service: Gastroenterology;  Laterality: N/A;   FOOT TENDON SURGERY Left    INTRAOPERATIVE TRANSTHORACIC ECHOCARDIOGRAM N/A 04/13/2019   Procedure: TRANSTHORACIC ECHOCARDIOGRAM;  Surgeon: Wonda Sharper, MD;  Location: [facility] OR;  Service: Open Heart Surgery;  Laterality: N/A;   RIGHT/LEFT HEART CATH AND CORONARY ANGIOGRAPHY N/A 03/18/2019   Procedure: RIGHT/LEFT HEART CATH AND CORONARY ANGIOGRAPHY;  Surgeon: Cherrie Toribio SAUNDERS, MD;  Location: [facility] INVASIVE CV LAB;  Service: Cardiovascular;  Laterality: N/A;   RIGHT/LEFT HEART CATH AND CORONARY ANGIOGRAPHY N/A 06/19/2023   Procedure: RIGHT/LEFT HEART CATH AND CORONARY ANGIOGRAPHY;  Surgeon: Wonda Sharper, MD;  Location: [facility] INVASIVE CV LAB;  Service: Cardiovascular;  Laterality: N/A;   TONSILLECTOMY  1940's   TOTAL KNEE ARTHROPLASTY  1990's   right   TOTAL KNEE ARTHROPLASTY Left 08/02/2013   Procedure: LEFT TOTAL KNEE ARTHROPLASTY;  Surgeon: Dempsey LULLA Moan, MD;  Location: [facility] ORS;  Service: Orthopedics;  Laterality: Left;   TRANSCATHETER AORTIC VALVE REPLACEMENT, TRANSFEMORAL N/A 04/13/2019   Procedure: TRANSCATHETER AORTIC VALVE REPLACEMENT, TRANSFEMORAL;  Surgeon: Wonda Sharper, MD;  Location: [facility] OR;  Service: Open Heart Surgery;  Laterality: N/A;   TRANSESOPHAGEAL  ECHOCARDIOGRAM (CATH LAB) N/A 04/07/2023   Procedure: TRANSESOPHAGEAL ECHOCARDIOGRAM;  Surgeon: Zenaida Morene PARAS, MD;  Location: [facility] INVASIVE CV LAB;  Service: Cardiovascular;  Laterality: N/A;   UPPER GI ENDOSCOPY     WISDOM TOOTH EXTRACTION      SOCIAL HISTORY: Social History   Socioeconomic History   Marital status: Married    Spouse name: [name]   Number of children: 2   Years of education: Not on file   Highest education level: Not on file  Occupational History   Occupation: Retired  Tobacco Use   Smoking status: Former    Types: Pipe, Cigars    Quit date: 04/01/1996    Years since quitting: 27.5   Smokeless tobacco: Former    Types: Chew    Quit date: 04/01/2009  Vaping Use   Vaping status: Never Used  Substance and Sexual Activity   Alcohol use: Not Currently    Alcohol/week: 0.0 standard drinks of alcohol    Comment: Quit White early 2020.   Drug  use: No   Sexual activity: Not Currently    Comment: lives with wife, eats clean diet. No major dietary restrtictions,   Other Topics Concern   Not on file  Social History Narrative   Live in home with wife.   Social Drivers of Corporate investment banker Strain: Low Risk  (02/20/2023)   Overall Financial Resource Strain (CARDIA)    Difficulty of Paying Living Expenses: Not hard at all  Food Insecurity: No Food Insecurity (10/01/2023)   Hunger Vital Sign    Worried About Running Out of Food in the Last Year: Never true    Ran Out of Food in the Last Year: Never true  Transportation Needs: No Transportation Needs (10/01/2023)   PRAPARE - Administrator, Civil Service (Medical): No    Lack of Transportation (Non-Medical): No  Physical Activity: Insufficiently Active (02/20/2023)   Exercise Vital Sign    Days of Exercise per Week: 7 days    Minutes of Exercise per Session: 20 min  Stress: No Stress Concern Present (02/20/2023)   [employer] of Occupational Health - Occupational Stress Questionnaire     Feeling of Stress : Not at all  Social Connections: Moderately Isolated (02/20/2023)   Social Connection and Isolation Panel    Frequency of Communication with Friends and Family: Three times a week    Frequency of Social Gatherings with Friends and Family: Once a week    Attends Religious Services: Never    Database administrator or Organizations: No    Attends Banker Meetings: Never    Marital Status: Married  Catering manager Violence: Not At Risk (10/01/2023)   Humiliation, Afraid, Rape, and Kick questionnaire    Fear of Current or Ex-Partner: No    Emotionally Abused: No    Physically Abused: No    Sexually Abused: No    FAMILY HISTORY: Family History  Problem Relation Age of Onset   Multiple sclerosis Father    Heart disease Mother        atrial fibrillation   Hypertension Son    Heart disease Maternal Grandfather     ALLERGIES:  is allergic to ace inhibitors, benazepril , codeine, statins, piroxicam, pseudoephedrine, brinzolamide-brimonidine, dasatinib , and betadine [povidone iodine ].  MEDICATIONS:  Current Outpatient Medications  Medication Sig Dispense Refill   acetaminophen  (TYLENOL ) 500 MG tablet Take 500 mg by mouth every 6 (six) hours as needed for moderate pain (pain score 4-6).     apixaban  (ELIQUIS ) 5 MG TABS tablet Take 5 mg by mouth 2 (two) times daily.     carboxymethylcellulose (REFRESH PLUS) 0.5 % SOLN Place 1 drop into both eyes as needed (dry eyes).     cholecalciferol (VITAMIN D3) 25 MCG (1000 UNIT) tablet Take 1,000 Units by mouth at bedtime.     CINNAMON PO Take 2,000 mg by mouth 2 (two) times daily.      Coenzyme Q10 (CVS COQ-10) 200 MG capsule Take 200 mg by mouth every evening.      empagliflozin (JARDIANCE) 25 MG TABS tablet Take 25 mg by mouth daily.     finasteride  (PROSCAR ) 5 MG tablet Take 5 mg by mouth every evening.     furosemide  (LASIX ) 20 MG tablet TAKE 2 TABLETS (40 MG TOTAL) BY MOUTH DAILY. 180 tablet 1   latanoprost  (XALATAN) 0.005 % ophthalmic solution Place 1 drop into both eyes at bedtime.     levothyroxine  (SYNTHROID ) 175 MCG tablet Take 175 mcg by mouth daily  before breakfast.     metFORMIN (GLUCOPHAGE-XR) 500 MG 24 hr tablet Take 500 mg by mouth 2 (two) times daily.     mirabegron ER (MYRBETRIQ) 50 MG TB24 tablet Take 50 mg by mouth daily.     nilotinib (TASIGNA) 150 MG capsule Take 150 mg by mouth 2 (two) times daily. Take on an empty stomach, 1 hr before or 2 hrs after food.     pantoprazole  (PROTONIX ) 40 MG tablet Take 1 tablet (40 mg total) by mouth 2 (two) times daily before a meal. 60 tablet 1   PARoxetine  (PAXIL ) 40 MG tablet Take 1 tablet (40 mg total) by mouth every morning. 90 tablet 1   potassium chloride  SA (KLOR-CON  M20) 20 MEQ tablet TAKE 1 TABLET BY MOUTH EVERY DAY 90 tablet 0   rosuvastatin  (CRESTOR ) 10 MG tablet TAKE 1 TABLET (10 MG TOTAL) BY MOUTH DAILY. PLEASE SCHEDULE AN APPOINTMENT FOR FURTHER REFILLS 90 tablet 1   spironolactone  (ALDACTONE ) 25 MG tablet Take 25 mg by mouth daily.     timolol (BETIMOL) 0.5 % ophthalmic solution Place 1 drop into both eyes daily.     traZODone  (DESYREL ) 50 MG tablet Take 50 mg by mouth at bedtime.     TURMERIC PO Take 1,000 mg by mouth every evening.      No current facility-administered medications for this visit.    REVIEW OF SYSTEMS:   Constitutional: ( - ) fevers, ( - )  chills , ( - ) night sweats Eyes: ( - ) blurriness of vision, ( - ) double vision, ( - ) watery eyes Ears, nose, mouth, throat, and face: ( - ) mucositis, ( - ) sore throat Respiratory: ( - ) cough, ( - ) dyspnea, ( - ) wheezes Cardiovascular: ( - ) palpitation, ( - ) chest discomfort, ( - ) lower extremity swelling Gastrointestinal:  ( - ) nausea, ( - ) heartburn, ( - ) change in bowel habits Skin: ( - ) abnormal skin rashes Lymphatics: ( - ) new lymphadenopathy, ( - ) easy bruising Neurological: ( - ) numbness, ( - ) tingling, ( - ) new weaknesses Behavioral/Psych: ( -  ) mood change, ( - ) new changes  All other systems were reviewed with the patient and are negative.  PHYSICAL EXAMINATION:  Vitals:   10/01/23 0906  BP: 123/71  Pulse: 77  Resp: 14  Temp: 97.9 F (36.6 C)  SpO2: 100%   Filed Weights   10/01/23 0906  Weight: 217 lb (98.4 kg)    GENERAL: well appearing elderly male in NAD  SKIN: skin color, texture, turgor are normal, no rashes or significant lesions EYES: conjunctiva are pink and non-injected, sclera clear LUNGS: clear to auscultation and percussion with normal breathing effort HEART: regular rate & rhythm and no murmurs and no lower extremity edema Musculoskeletal: no cyanosis of digits and no clubbing  PSYCH: alert & oriented x 3, fluent speech NEURO: no focal motor/sensory deficits  LABORATORY DATA:  I have reviewed the data as listed    Latest Ref Rng & Units 10/01/2023   10:02 AM 09/24/2023   11:37 AM 09/16/2023    3:38 PM  CBC  WBC 4.0 - 10.5 K/uL 6.9  5.2  4.4   Hemoglobin 13.0 - 17.0 g/dL 8.9  8.8 Repeated and verified X2.  8.4 Repeated and verified X2.   Hematocrit 39.0 - 52.0 % 29.3  28.8  26.9   Platelets 150 - 400 K/uL 252  224.0  230.0        Latest Ref Rng & Units 10/01/2023   10:02 AM 09/24/2023   11:37 AM 09/05/2023    8:52 AM  CMP  Glucose 70 - 99 mg/dL 811  810  844   BUN 8 - 23 mg/dL 31  33  32   Creatinine 0.61 - 1.24 mg/dL 8.47  8.38  8.79   Sodium 135 - 145 mmol/L 137  135  138   Potassium 3.5 - 5.1 mmol/L 4.7  5.0  4.3   Chloride 98 - 111 mmol/L 102  100  105   CO2 22 - 32 mmol/L 27  27    Calcium  8.9 - 10.3 mg/dL 9.7  9.2    Total Protein 6.5 - 8.1 g/dL 8.5  7.8    Total Bilirubin 0.0 - 1.2 mg/dL 0.5  0.5    Alkaline Phos 38 - 126 U/L 70  62    AST 15 - 41 U/L 22  19    ALT 0 - 44 U/L 13  13       ASSESSMENT & PLAN [name] White 88 y.o. male with medical history significant for atrial flutter, congestive heart failure, CAD, type 2 diabetes, GERD, hypothyroidism, hypertension, and  sleep apnea on CPAP who presents for evaluation of CML and IDA.  After review of the labs, review of the records, and discussion with the patient the patients findings are most consistent with well-controlled CML and IDA under the management of the [facility].  # Iron Deficiency Anemia 2/2 to GI bleeding -- Patient has IDA secondary to GI bleeding, is currently receiving IV iron therapy with another dose scheduled later this week. -- We will order CBC, CMP, and iron labs today. -- Recommend he follow through with IV iron as prescribed by the [facility].  # Chronic Myeloid Leukemia--well controlled -- Currently under excellent control with Nilotinib therapy -- Previously on Sprycel  therapy but discontinued due to swelling. -- Currently being managed by Dr. Kennedy at the [facility]. -- Discussed with the patient where he would like his care, he noted that he is happy with his care at the [facility]. -- If the patient were to want to transfer his care to [facility] health we would be happy to care for him here.  He is getting excellent standard of care at the [facility] at this time. -- Of note [facility] would not [facility] his care to be [facility] here and he would have to have this done through his private insurance -- Patient voiced understanding of our findings and recommendation.  Plan to see him back on an as-needed basis.  Orders Placed This Encounter  Procedures   CBC with Differential (Cancer Center Only)    Standing Status:   Future    Number of Occurrences:   1    Expiration Date:   09/30/2024   CMP (Cancer Center only)    Standing Status:   Future    Number of Occurrences:   1    Expiration Date:   09/30/2024   Ferritin    Standing Status:   Future    Number of Occurrences:   1    Expiration Date:   09/30/2024   Iron and Iron Binding Capacity ([facility] only)    Standing Status:   Future    Number of Occurrences:   1    Expiration Date:   09/30/2024   Retic Panel    Standing Status:   Future  Number of Occurrences:   1     Expiration Date:   09/30/2024   Lactate dehydrogenase (LDH)    Standing Status:   Future    Number of Occurrences:   1    Expiration Date:   09/30/2024    All questions were answered. The patient knows to call the clinic with any problems, questions or concerns.  A total of more than 45 minutes were spent on this encounter with face-to-face time and non-face-to-face time, including preparing to see the patient, ordering tests and/or medications, counseling the patient and coordination of care as outlined above.   Norleen IVAR Kidney, MD Department of Hematology/Oncology [facility] Phone: [phone] Pager: [phone] Email: [email or web address]  10/05/2023 6:07 PM

## 2023-10-07 ENCOUNTER — Encounter: Payer: Self-pay | Admitting: Hematology and Oncology

## 2023-10-12 ENCOUNTER — Other Ambulatory Visit (HOSPITAL_COMMUNITY): Payer: Self-pay | Admitting: Internal Medicine

## 2023-10-22 ENCOUNTER — Encounter: Payer: Self-pay | Admitting: Internal Medicine

## 2023-10-22 ENCOUNTER — Ambulatory Visit: Admitting: Internal Medicine

## 2023-10-22 VITALS — BP 124/82 | HR 62 | Temp 98.3°F | Ht 74.0 in | Wt 217.0 lb

## 2023-10-22 DIAGNOSIS — F411 Generalized anxiety disorder: Secondary | ICD-10-CM | POA: Diagnosis not present

## 2023-10-22 DIAGNOSIS — D500 Iron deficiency anemia secondary to blood loss (chronic): Secondary | ICD-10-CM | POA: Diagnosis not present

## 2023-10-22 DIAGNOSIS — E669 Obesity, unspecified: Secondary | ICD-10-CM

## 2023-10-22 DIAGNOSIS — Z7984 Long term (current) use of oral hypoglycemic drugs: Secondary | ICD-10-CM

## 2023-10-22 DIAGNOSIS — E1169 Type 2 diabetes mellitus with other specified complication: Secondary | ICD-10-CM | POA: Diagnosis not present

## 2023-10-22 DIAGNOSIS — C9210 Chronic myeloid leukemia, BCR/ABL-positive, not having achieved remission: Secondary | ICD-10-CM

## 2023-10-22 DIAGNOSIS — R0609 Other forms of dyspnea: Secondary | ICD-10-CM

## 2023-10-22 MED ORDER — NILOTINIB HCL 150 MG PO CAPS: 150.0000 mg | ORAL_CAPSULE | Freq: Two times a day (BID) | ORAL | 1 refills | Status: AC

## 2023-10-22 NOTE — Assessment & Plan Note (Signed)
 [name] White wants Dr Federico to start taking care of his CML and anemia. He forgot to ask about Nilotinib  and Protonix  drug interaction and what he should do about it.

## 2023-10-22 NOTE — Assessment & Plan Note (Signed)
 Better w/anemia treatment

## 2023-10-22 NOTE — Assessment & Plan Note (Signed)
 On IV iron He forgot to ask about Nilotinib  and Protonix  drug interaction and what he should do about it.

## 2023-10-22 NOTE — Progress Notes (Signed)
 Subjective:  Patient ID: [name] White, male    DOB: 24-Jan-1936  Age: 88 y.o. MRN: [id number]  CC: Follow-up   HPI [name] White presents for anemia, CML, anxiety, DM Pt saw Dr Federico: [name] White 88 y.o. male with medical history significant for atrial flutter, congestive heart failure, CAD, type 2 diabetes, GERD, hypothyroidism, hypertension, and sleep apnea on CPAP who presents for evaluation of CML and IDA.   After review of the labs, review of the records, and discussion with the patient the patients findings are most consistent with well-controlled CML and IDA under the management of the [facility].   # Iron Deficiency Anemia 2/2 to GI bleeding -- Patient has IDA secondary to GI bleeding, is currently receiving IV iron therapy with another dose scheduled later this week. -- We will order CBC, CMP, and iron labs today. -- Recommend he follow through with IV iron as prescribed by the [facility].   # Chronic Myeloid Leukemia--well controlled -- Currently under excellent control with Nilotinib  therapy -- Previously on Sprycel  therapy but discontinued due to swelling. -- Currently being managed by Dr. Kennedy at the [facility]. -- Discussed with the patient where he would like his care, he noted that he is happy with his care at the [facility] -- If the patient were to want to transfer his care to [facility] we would be happy to care for him here.  He is getting excellent standard of care at the [facility] at this time. -- Of note [facility] would not [facility] to be [facility] here and he would have to have this done through his private insurance -- Patient voiced understanding of our findings and recommendation.  Plan to see him back on an as-needed basis.  The pt is c/o that he is low on  Nilotinib  now. He is getting iron infusions    Outpatient Medications Prior to Visit  Medication Sig Dispense Refill   acetaminophen  (TYLENOL ) 500 MG tablet Take 500 mg by mouth every 6 (six) hours as needed for  moderate pain (pain score 4-6).     apixaban  (ELIQUIS ) 5 MG TABS tablet Take 5 mg by mouth 2 (two) times daily.     carboxymethylcellulose (REFRESH PLUS) 0.5 % SOLN Place 1 drop into both eyes as needed (dry eyes).     cholecalciferol (VITAMIN D3) 25 MCG (1000 UNIT) tablet Take 1,000 Units by mouth at bedtime.     CINNAMON PO Take 2,000 mg by mouth 2 (two) times daily.      Coenzyme Q10 ([employer] COQ-10) 200 MG capsule Take 200 mg by mouth every evening.      empagliflozin (JARDIANCE) 25 MG TABS tablet Take 25 mg by mouth daily.     finasteride  (PROSCAR ) 5 MG tablet Take 5 mg by mouth every evening.     furosemide  (LASIX ) 20 MG tablet TAKE 2 TABLETS (40 MG TOTAL) BY MOUTH DAILY. 180 tablet 1   latanoprost (XALATAN) 0.005 % ophthalmic solution Place 1 drop into both eyes at bedtime.     levothyroxine  (SYNTHROID ) 175 MCG tablet Take 175 mcg by mouth daily before breakfast.     metFORMIN (GLUCOPHAGE-XR) 500 MG 24 hr tablet Take 500 mg by mouth 2 (two) times daily.     mirabegron ER (MYRBETRIQ) 50 MG TB24 tablet Take 50 mg by mouth daily.     pantoprazole  (PROTONIX ) 40 MG tablet Take 1 tablet (40 mg total) by mouth 2 (two) times daily before a  meal. 60 tablet 1   PARoxetine  (PAXIL ) 40 MG tablet Take 1 tablet (40 mg total) by mouth every morning. 90 tablet 1   potassium chloride  SA (KLOR-CON  M20) 20 MEQ tablet TAKE 1 TABLET BY MOUTH EVERY DAY 90 tablet 0   rosuvastatin  (CRESTOR ) 10 MG tablet TAKE 1 TABLET (10 MG TOTAL) BY MOUTH DAILY. PLEASE SCHEDULE AN APPOINTMENT FOR FURTHER REFILLS 90 tablet 3   spironolactone  (ALDACTONE ) 25 MG tablet Take 25 mg by mouth daily.     timolol (BETIMOL) 0.5 % ophthalmic solution Place 1 drop into both eyes daily.     traZODone  (DESYREL ) 50 MG tablet Take 50 mg by mouth at bedtime.     TURMERIC PO Take 1,000 mg by mouth every evening.      nilotinib  (TASIGNA ) 150 MG capsule Take 150 mg by mouth 2 (two) times daily. Take on an empty stomach, 1 hr before or 2 hrs after food.      No facility-administered medications prior to visit.    ROS: Review of Systems  Constitutional:  Positive for fatigue. Negative for appetite change and unexpected weight change.  HENT:  Negative for congestion, nosebleeds, sneezing, sore throat and trouble swallowing.   Eyes:  Negative for itching and visual disturbance.  Respiratory:  Positive for shortness of breath. Negative for cough.   Cardiovascular:  Negative for chest pain, palpitations and leg swelling.  Gastrointestinal:  Negative for abdominal distention, blood in stool, diarrhea and nausea.  Genitourinary:  Negative for frequency and hematuria.  Musculoskeletal:  Positive for arthralgias. Negative for back pain, gait problem, joint swelling and neck pain.  Skin:  Negative for rash.  Neurological:  Negative for dizziness, tremors, speech difficulty and weakness.  Psychiatric/Behavioral:  Negative for agitation, dysphoric mood and sleep disturbance. The patient is not nervous/anxious.     Objective:  BP 124/82 (BP Location: Left Arm, Patient Position: Sitting)   Pulse 62   Temp 98.3 F (36.8 C) (Temporal)   Ht 6' 2 (1.88 m)   Wt 217 lb (98.4 kg)   SpO2 96%   BMI 27.86 kg/m   BP Readings from Last 3 Encounters:  10/22/23 124/82  10/01/23 123/71  09/18/23 118/62    Wt Readings from Last 3 Encounters:  10/22/23 217 lb (98.4 kg)  10/01/23 217 lb (98.4 kg)  09/18/23 215 lb (97.5 kg)    Physical Exam Constitutional:      General: He is not in acute distress.    Appearance: Normal appearance. He is well-developed.     Comments: NAD  Eyes:     Conjunctiva/sclera: Conjunctivae normal.     Pupils: Pupils are equal, round, and reactive to light.  Neck:     Thyroid : No thyromegaly.     Vascular: No JVD.  Cardiovascular:     Rate and Rhythm: Normal rate and regular rhythm.     Heart sounds: Normal heart sounds. No murmur heard.    No friction rub. No gallop.  Pulmonary:     Effort: Pulmonary effort is  normal. No respiratory distress.     Breath sounds: Normal breath sounds. No wheezing or rales.  Chest:     Chest wall: No tenderness.  Abdominal:     General: Bowel sounds are normal. There is no distension.     Palpations: Abdomen is soft. There is no mass.     Tenderness: There is no abdominal tenderness. There is no guarding or rebound.  Musculoskeletal:        General:  No tenderness. Normal range of motion.     Cervical back: Normal range of motion.     Right lower leg: No edema.     Left lower leg: No edema.  Lymphadenopathy:     Cervical: No cervical adenopathy.  Skin:    General: Skin is warm and dry.     Findings: No rash.  Neurological:     Mental Status: He is alert and oriented to person, place, and time.     Cranial Nerves: No cranial nerve deficit.     Motor: No abnormal muscle tone.     Coordination: Coordination normal.     Gait: Gait normal.     Deep Tendon Reflexes: Reflexes are normal and symmetric.  Psychiatric:        Behavior: Behavior normal.        Thought Content: Thought content normal.        Judgment: Judgment normal.   Arthritic gait Using a cane  Lab Results  Component Value Date   WBC 6.9 10/01/2023   HGB 8.9 (L) 10/01/2023   HCT 29.3 (L) 10/01/2023   PLT 252 10/01/2023   GLUCOSE 188 (H) 10/01/2023   CHOL 138 09/24/2023   TRIG 48.0 09/24/2023   HDL 67.30 09/24/2023   LDLDIRECT 141.6 11/12/2010   LDLCALC 62 09/24/2023   ALT 13 10/01/2023   AST 22 10/01/2023   NA 137 10/01/2023   K 4.7 10/01/2023   CL 102 10/01/2023   CREATININE 1.52 (H) 10/01/2023   BUN 31 (H) 10/01/2023   CO2 27 10/01/2023   TSH 0.01 (L) 09/24/2023   PSA 1.42 09/13/2013   INR 1.3 (H) 09/02/2023   HGBA1C 7.3 (H) 09/24/2023   MICROALBUR 5.6 05/26/2015    No results found.  Assessment & Plan:   Problem List Items Addressed This Visit     Anemia   On IV iron He forgot to ask about Nilotinib  and Protonix  drug interaction and what he should do about it.       Anxiety state   On Paroxetine  po      CML (chronic myelocytic leukemia) (HCC) - Primary   Mr Sharan wants Dr Federico to start taking care of his CML and anemia. He forgot to ask about Nilotinib  and Protonix  drug interaction and what he should do about it.      Relevant Medications   nilotinib  (TASIGNA ) 150 MG capsule   DOE (dyspnea on exertion)   Better w/anemia treatment      Type 2 diabetes mellitus with obesity (HCC) (Chronic)   A1c and other labs - done at the [facility] On Metformin and Jardiance per [facility] Endocrinology.         Meds ordered this encounter  Medications   nilotinib  (TASIGNA ) 150 MG capsule    Sig: Take 1 capsule (150 mg total) by mouth 2 (two) times daily. Take on an empty stomach, 1 hr before or 2 hrs after food.    Dispense:  60 capsule    Refill:  1      Follow-up: Return in about 3 months (around 01/22/2024) for a follow-up visit.  Marolyn Noel, MD

## 2023-10-22 NOTE — Assessment & Plan Note (Signed)
 On Paroxetine  po

## 2023-10-22 NOTE — Assessment & Plan Note (Signed)
 A1c and other labs - done at the [facility] On Metformin and Jardiance per [facility] Endocrinology.

## 2023-10-27 ENCOUNTER — Ambulatory Visit (HOSPITAL_COMMUNITY)
Admission: RE | Admit: 2023-10-27 | Discharge: 2023-10-27 | Disposition: A | Discharge status: Home / Self Care | Source: Ambulatory Visit | Attending: Cardiology | Admitting: Cardiology

## 2023-10-27 ENCOUNTER — Encounter (HOSPITAL_COMMUNITY): Payer: Self-pay | Admitting: Cardiology

## 2023-10-27 VITALS — BP 122/66 | HR 77 | Ht 74.0 in | Wt 216.0 lb

## 2023-10-27 DIAGNOSIS — C9210 Chronic myeloid leukemia, BCR/ABL-positive, not having achieved remission: Secondary | ICD-10-CM | POA: Insufficient documentation

## 2023-10-27 DIAGNOSIS — I6523 Occlusion and stenosis of bilateral carotid arteries: Secondary | ICD-10-CM | POA: Diagnosis not present

## 2023-10-27 DIAGNOSIS — I130 Hypertensive heart and chronic kidney disease with heart failure and stage 1 through stage 4 chronic kidney disease, or unspecified chronic kidney disease: Secondary | ICD-10-CM | POA: Diagnosis not present

## 2023-10-27 DIAGNOSIS — I4821 Permanent atrial fibrillation: Secondary | ICD-10-CM | POA: Diagnosis not present

## 2023-10-27 DIAGNOSIS — Z7901 Long term (current) use of anticoagulants: Secondary | ICD-10-CM | POA: Insufficient documentation

## 2023-10-27 DIAGNOSIS — I5032 Chronic diastolic (congestive) heart failure: Secondary | ICD-10-CM | POA: Diagnosis not present

## 2023-10-27 DIAGNOSIS — D631 Anemia in chronic kidney disease: Secondary | ICD-10-CM | POA: Insufficient documentation

## 2023-10-27 DIAGNOSIS — Z952 Presence of prosthetic heart valve: Secondary | ICD-10-CM | POA: Diagnosis not present

## 2023-10-27 DIAGNOSIS — D509 Iron deficiency anemia, unspecified: Secondary | ICD-10-CM | POA: Diagnosis not present

## 2023-10-27 DIAGNOSIS — E785 Hyperlipidemia, unspecified: Secondary | ICD-10-CM | POA: Insufficient documentation

## 2023-10-27 DIAGNOSIS — Z7984 Long term (current) use of oral hypoglycemic drugs: Secondary | ICD-10-CM | POA: Diagnosis not present

## 2023-10-27 DIAGNOSIS — I2510 Atherosclerotic heart disease of native coronary artery without angina pectoris: Secondary | ICD-10-CM | POA: Diagnosis not present

## 2023-10-27 DIAGNOSIS — G4730 Sleep apnea, unspecified: Secondary | ICD-10-CM | POA: Insufficient documentation

## 2023-10-27 DIAGNOSIS — E669 Obesity, unspecified: Secondary | ICD-10-CM | POA: Insufficient documentation

## 2023-10-27 DIAGNOSIS — E1122 Type 2 diabetes mellitus with diabetic chronic kidney disease: Secondary | ICD-10-CM | POA: Insufficient documentation

## 2023-10-27 DIAGNOSIS — N1832 Chronic kidney disease, stage 3b: Secondary | ICD-10-CM | POA: Insufficient documentation

## 2023-10-27 DIAGNOSIS — I110 Hypertensive heart disease with heart failure: Secondary | ICD-10-CM | POA: Diagnosis present

## 2023-10-27 NOTE — Patient Instructions (Signed)
 STOP Potassium  Your physician has requested that you have an echocardiogram. Echocardiography is a painless test that uses sound waves to create images of your heart. It provides your doctor with information about the size and shape of your heart and how well your heart's chambers and valves are working. This procedure takes approximately one hour. There are no restrictions for this procedure. Please do NOT wear cologne, perfume, aftershave, or lotions (deodorant is allowed). Please arrive 15 minutes prior to your appointment time.  Please note: We ask at that you not bring children with you during ultrasound (echo/ vascular) testing. Due to room size and safety concerns, children are not allowed in the ultrasound rooms during exams. Our front office staff cannot provide observation of children in our lobby area while testing is being conducted. An adult accompanying a patient to their appointment will only be allowed in the ultrasound room at the discretion of the ultrasound technician under special circumstances. We apologize for any inconvenience.  Your physician recommends that you schedule a follow-up appointment in: 4 months ( November) ** PLEASE CALL THE OFFICE IN SEPTEMBER TO ARRANGE YOUR FOLLOW UP APPOINTMENT.**  If you have any questions or concerns before your next appointment please send us  a message through [facility] or call our office at [phone].    TO LEAVE A MESSAGE FOR THE NURSE SELECT OPTION 2, PLEASE LEAVE A MESSAGE INCLUDING: YOUR NAME DATE OF BIRTH CALL BACK NUMBER REASON FOR CALL**this is important as we prioritize the call backs  YOU WILL RECEIVE A CALL BACK THE SAME DAY AS LONG AS YOU CALL BEFORE 4:00 PM  At the [facility], you and your health needs are our priority. As part of our continuing mission to provide you with exceptional heart care, we have created designated Provider Care Teams. These Care Teams include your primary Cardiologist  (physician) and Advanced Practice Providers (APPs- Physician Assistants and Nurse Practitioners) who all work together to provide you with the care you need, when you need it.   You may see any of the following providers on your designated Care Team at your next follow up: Dr Toribio Fuel Dr Ezra Shuck Dr. Ria Commander Dr. [name] Amy Lenetta, NP Caffie Shed, [place] [facility] [place], [place] Beckey Coe, NP [place] Lee, NP Ellouise Class, NP Tinnie Redman, PharmD Jaun Bash, PharmD   Please be sure to bring in all your medications bottles to every appointment.    Thank you for choosing [facility]

## 2023-10-27 NOTE — Progress Notes (Signed)
 ADVANCED HEART FAILURE FOLLOW UP CLINIC NOTE  Referring Physician: [name], MD  Primary Care: [name], MD Primary Cardiologist:  HPI: [name] White is a 88 y.o. male with a past medical history of nonobstructive CAD, hypertension, hyperlipidemia, obesity, sleep apnea, severe aortic stenosis s/p TAVR 04/2019, atrial flutter s/p ablation who presents for follow up of chronic diastolic heart failure.      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 Being treated at [facility]. Has lost over 40 pounds. BP down so clonidine  stopped.    Echo 10/20 EF 55-60% RV ok. Severe AS mean gradient Small effusion. Mild to mod MR/TR. S/p TAVR on 04/13/19.  Echo 05/17/19 EF 60-65%. TAVR ok. RV ok. Mod TR   Echo 01/17/21: EF 60-65% TAVR stable. Severe biatrial enlargement.   Underwent TEE/RHC for worsening mitral regurgitation noted on echocardiogram. Evidence of severe MR by imaging and RHC. Symptoms minimal, and given significant posterior calcification with small leaflet elected to hold off at this time     SUBJECTIVE:  Overall doing well, no change in weight or symptoms of shortness of breath since his last visit. Shortness of breath with anything more than mild exertion, but does report ongoing issues with iron deficiency anemia, suspicion for GI bleed. He is undergoing workup with GI at this time, minimal improvement with IV iron.   PMH, current medications, allergies, social history, and family history reviewed in epic.  PHYSICAL EXAM: Vitals:   10/27/23 1122  BP: 122/66  Pulse: 77  SpO2: 97%     GENERAL: NAD, well appearing PULM:  Normal work of breathing, CTAB CARDIAC:  JVP: flat         Normal rate with regular rhythm. 3/6 Blowing systolic murmur heard over the apex, soft systolic murmur heard in the right upper sternal borderTrace edema. Warm and well perfused extremities. ABDOMEN: Soft, non-tender, non-distended. NEUROLOGIC: Patient is oriented x3 with no  focal or lateralizing neurologic deficits.    DATA REVIEW   ECHO: Echo 10/20 EF 55-60% RV ok. Severe AS mean gradient Small effusion. Mild to mod MR/TR. S/p TAVR on 04/13/19.  Echo 05/17/19 EF 60-65%. TAVR ok. RV ok. Mod TR   Echo 01/17/21: EF 60-65% TAVR stable. Severe biatrial enlargement.   03/2023: Normal ejection fraction, appropriately performing bioprosthetic aortic valve, worsening pulmonary artery pressures and likely new severe mitral regurgitation, eccentric jet that is not clearly seen on previous echocardiogram  CATH: R/L cath 12/20 Prox RCA to Mid RCA lesion is 30% stenosed. RPAV lesion is 30% stenosed. Ost Cx to Prox Cx lesion is 50% stenosed. Prox LAD to Mid LAD lesion is 20% stenosed  RA: 5, PA: 37/13 (22): PCWP 14, Fick cardiac output 9.8, index 4.3 but no evidence of shunt  06/19/23: RA18, PA 46/19 (31), PCWP 21 with v waves to 34, Fick CO/CI 7.32/3.2, mild nonobstructive disease  ASSESSMENT & PLAN:  Mitral regurgitation: Severe MR, workup including TEE and RHC. Discussed with structural team, given minimal symptoms and calfied leaflet they had elected to hold off. NYHA class III, stable. -TEE and structural recs reviewed with patient -Holding off on intervention -Continue lasix  40mg  daily -Appreciate structural team - Repeat echo in 1 year  Chronic diastolic heart failure: Preserved ejection fraction in the setting of new severe MR.   -Continue jardiance 25mg  daily, spirnolactone 25mg  daily -Continue Lasix  as above  Severe AS: TAVR 1/21.  Well-functioning valve on echocardiogram, will need SBE prophylaxis. -Yearly echoes for monitoring  Hypertension: Hypotensive today, adjustment as above.  CAD - No current s/s ischemia.  - cath 05/2023 with stable non-obstructive CAD - Continue Crestor , Not on ASA due to Eliquis . Not on BB with bradycardia   CML - Following with Dr. Vertell Kast at [facility] - On nilotinib    Atrial fibrillation,  permanent - Tikosyn  stopped 5/17 due to persistent AF. - S/p successful cardioversion 03/2023, unfortunately back in afib - Continue Eliquis  5 BID   Iron deficiency anemia: Noted recently, iron deficient despite iron infusion. Risk benefit discussion had again today, would like to continue apixaban  in the lack of active bleeding. He was open to discussion about watchman, we discussed the procedure. If still a problem at next visit he would like referral.    Carotid Stenosis - Carotid duplex 12/2013; stable 0-39% bilateral ICA stenosis - Continue Crestor  - F/u as needed   DM2 - Continue Jardiance 25mg  daily   CKD 3b -Creatinine stable on labs from 7/2, 1.52  I spent 45 minutes caring for this patient today including face to face time, ordering and reviewing labs, reviewing records from recent GI and oncology visits, risk benefit discussion of anticoagulation and discussion about watcman, seeing the patient, documenting in the record, and arranging follow ups.   [name], MD [facility] Mechanical Circulatory Support 10/28/23

## 2023-10-30 ENCOUNTER — Telehealth: Payer: Self-pay | Admitting: Hematology and Oncology

## 2023-10-30 ENCOUNTER — Other Ambulatory Visit: Payer: Self-pay | Admitting: Hematology and Oncology

## 2023-10-30 DIAGNOSIS — C9210 Chronic myeloid leukemia, BCR/ABL-positive, not having achieved remission: Secondary | ICD-10-CM

## 2023-10-30 DIAGNOSIS — D500 Iron deficiency anemia secondary to blood loss (chronic): Secondary | ICD-10-CM

## 2023-11-03 ENCOUNTER — Telehealth: Payer: Self-pay

## 2023-11-03 NOTE — Telephone Encounter (Signed)
 Patient is awaiting to be scheduled for a hospital procedure.

## 2023-11-04 ENCOUNTER — Telehealth: Payer: Self-pay | Admitting: *Deleted

## 2023-11-04 NOTE — Telephone Encounter (Signed)
 PC to patient following VM requesting call back.  [name] said he's scheduled for a lab appt at [facility] in the morning (11/05/23 @ 10 am). He said he is having difficulty walking and wants to know which floor lab is on. He said he isn't feeling well and knows he needs to have the labs done to see what's wrong.  Provided information on available valet parking and mobility assistance with transport chairs while he is here and that lab is on the same floor as entrance/lobby. He verbalized understanding.  He asked that Dr. Federico be told that he's had many iron infusions and never felt better after any of them, stating he never noticed the difference. He said he did feel better almost immediately following a blood transfusion so if possible, he'd like the doctor to order another bag of A positive blood.   Advised him that Dr. Federico will review lab results to determine next steps in his care. He said he looks forward to feeling better.

## 2023-11-05 ENCOUNTER — Other Ambulatory Visit: Payer: Self-pay | Admitting: *Deleted

## 2023-11-05 ENCOUNTER — Inpatient Hospital Stay: Attending: Hematology and Oncology

## 2023-11-05 ENCOUNTER — Telehealth: Payer: Self-pay | Admitting: *Deleted

## 2023-11-05 DIAGNOSIS — Z79899 Other long term (current) drug therapy: Secondary | ICD-10-CM | POA: Diagnosis not present

## 2023-11-05 DIAGNOSIS — D500 Iron deficiency anemia secondary to blood loss (chronic): Secondary | ICD-10-CM

## 2023-11-05 DIAGNOSIS — C9210 Chronic myeloid leukemia, BCR/ABL-positive, not having achieved remission: Secondary | ICD-10-CM | POA: Insufficient documentation

## 2023-11-05 LAB — CBC WITH DIFFERENTIAL (CANCER CENTER ONLY)
Abs Immature Granulocytes: 0.01 K/uL (ref 0.00–0.07)
Basophils Absolute: 0.1 K/uL (ref 0.0–0.1)
Basophils Relative: 1 %
Eosinophils Absolute: 0.1 K/uL (ref 0.0–0.5)
Eosinophils Relative: 2 %
HCT: 22 % — ABNORMAL LOW (ref 39.0–52.0)
Hemoglobin: 6.5 g/dL — CL (ref 13.0–17.0)
Immature Granulocytes: 0 %
Lymphocytes Relative: 17 %
Lymphs Abs: 0.9 K/uL (ref 0.7–4.0)
MCH: 22.3 pg — ABNORMAL LOW (ref 26.0–34.0)
MCHC: 29.5 g/dL — ABNORMAL LOW (ref 30.0–36.0)
MCV: 75.3 fL — ABNORMAL LOW (ref 80.0–100.0)
Monocytes Absolute: 0.4 K/uL (ref 0.1–1.0)
Monocytes Relative: 8 %
Neutro Abs: 3.8 K/uL (ref 1.7–7.7)
Neutrophils Relative %: 72 %
Platelet Count: 279 K/uL (ref 150–400)
RBC: 2.92 MIL/uL — ABNORMAL LOW (ref 4.22–5.81)
RDW: 19.6 % — ABNORMAL HIGH (ref 11.5–15.5)
WBC Count: 5.3 K/uL (ref 4.0–10.5)
nRBC: 0 % (ref 0.0–0.2)

## 2023-11-05 LAB — FERRITIN: Ferritin: 29 ng/mL (ref 24–336)

## 2023-11-05 LAB — CMP (CANCER CENTER ONLY)
ALT: 10 U/L (ref 0–44)
AST: 18 U/L (ref 15–41)
Albumin: 4.2 g/dL (ref 3.5–5.0)
Alkaline Phosphatase: 69 U/L (ref 38–126)
Anion gap: 6 (ref 5–15)
BUN: 34 mg/dL — ABNORMAL HIGH (ref 8–23)
CO2: 27 mmol/L (ref 22–32)
Calcium: 9.2 mg/dL (ref 8.9–10.3)
Chloride: 102 mmol/L (ref 98–111)
Creatinine: 1.68 mg/dL — ABNORMAL HIGH (ref 0.61–1.24)
GFR, Estimated: 39 mL/min — ABNORMAL LOW (ref >60)
Glucose, Bld: 173 mg/dL — ABNORMAL HIGH (ref 70–99)
Potassium: 4.7 mmol/L (ref 3.5–5.1)
Sodium: 135 mmol/L (ref 135–145)
Total Bilirubin: 0.5 mg/dL (ref 0.0–1.2)
Total Protein: 7.7 g/dL (ref 6.5–8.1)

## 2023-11-05 LAB — RETIC PANEL
Immature Retic Fract: 32.9 % — ABNORMAL HIGH (ref 2.3–15.9)
RBC.: 2.9 MIL/uL — ABNORMAL LOW (ref 4.22–5.81)
Retic Count, Absolute: 62.9 K/uL (ref 19.0–186.0)
Retic Ct Pct: 2.2 % (ref 0.4–3.1)
Reticulocyte Hemoglobin: 17.3 pg — ABNORMAL LOW (ref >27.9)

## 2023-11-05 LAB — IRON AND IRON BINDING CAPACITY (CC-WL,HP ONLY)
Iron: 13 ug/dL — ABNORMAL LOW (ref 45–182)
Saturation Ratios: 3 % — ABNORMAL LOW (ref 17.9–39.5)
TIBC: 419 ug/dL (ref 250–450)
UIBC: 406 ug/dL — ABNORMAL HIGH (ref 117–376)

## 2023-11-05 NOTE — Telephone Encounter (Signed)
 TCT patient as we have received his CBC results. Pt's HGB is down to 6.5 and he will need transfusion.  No answer to call. LVM message for pt to call back as soon as possible to arrange lab appt for T&C and then transfusion on Friday, 8//8/25 @ 12:30 pm.

## 2023-11-05 NOTE — Telephone Encounter (Signed)
 Received call back from pt. Advised that his HGB is 6.5 today and that Dr. Federico wants him to get 2 units of blood. Advised that this is scheduled for Friday @ 12:30pm. Also informed him that we need to get another sample of blood from him to do his type and cross.Asked pt is he could come back tomorrow for that,. He said he can come tomorrow @ 10 am. Letitia he is very fatigued. His wife can drive. He does know he is losing blood in his lower GI tract, with black stools. He states he is glad to to get the transfusions Scheduler has made appts. Lab orders are in.

## 2023-11-06 ENCOUNTER — Other Ambulatory Visit: Payer: Self-pay | Admitting: *Deleted

## 2023-11-06 ENCOUNTER — Telehealth: Payer: Self-pay

## 2023-11-06 ENCOUNTER — Inpatient Hospital Stay

## 2023-11-06 DIAGNOSIS — D500 Iron deficiency anemia secondary to blood loss (chronic): Secondary | ICD-10-CM

## 2023-11-06 DIAGNOSIS — C9210 Chronic myeloid leukemia, BCR/ABL-positive, not having achieved remission: Secondary | ICD-10-CM | POA: Diagnosis not present

## 2023-11-06 DIAGNOSIS — Z79899 Other long term (current) drug therapy: Secondary | ICD-10-CM | POA: Diagnosis not present

## 2023-11-06 LAB — PREPARE RBC (CROSSMATCH)

## 2023-11-06 NOTE — Telephone Encounter (Signed)
 N/a

## 2023-11-07 ENCOUNTER — Other Ambulatory Visit: Payer: Self-pay | Admitting: *Deleted

## 2023-11-07 ENCOUNTER — Inpatient Hospital Stay

## 2023-11-07 DIAGNOSIS — C9210 Chronic myeloid leukemia, BCR/ABL-positive, not having achieved remission: Secondary | ICD-10-CM | POA: Diagnosis not present

## 2023-11-07 DIAGNOSIS — D500 Iron deficiency anemia secondary to blood loss (chronic): Secondary | ICD-10-CM

## 2023-11-07 DIAGNOSIS — Z79899 Other long term (current) drug therapy: Secondary | ICD-10-CM | POA: Diagnosis not present

## 2023-11-07 LAB — PREPARE RBC (CROSSMATCH)

## 2023-11-07 LAB — SAMPLE TO BLOOD BANK

## 2023-11-07 MED ORDER — SODIUM CHLORIDE 0.9% IV SOLUTION: 250.0000 mL | INTRAVENOUS | Status: DC

## 2023-11-07 MED ORDER — ACETAMINOPHEN 325 MG PO TABS
650.0000 mg | ORAL_TABLET | Freq: Once | ORAL | Status: AC
  Administered 2023-11-07: 650 mg via ORAL
  Filled 2023-11-07: qty 2

## 2023-11-07 MED ORDER — ACETAMINOPHEN 325 MG PO TABS: 650.0000 mg | ORAL_TABLET | Freq: Once | ORAL | Status: DC

## 2023-11-07 NOTE — Patient Instructions (Signed)

## 2023-11-08 ENCOUNTER — Other Ambulatory Visit: Payer: Self-pay | Admitting: Physician Assistant

## 2023-11-08 ENCOUNTER — Inpatient Hospital Stay

## 2023-11-08 VITALS — BP 99/55 | HR 63 | Temp 98.2°F | Resp 18

## 2023-11-08 DIAGNOSIS — Z79899 Other long term (current) drug therapy: Secondary | ICD-10-CM | POA: Diagnosis not present

## 2023-11-08 DIAGNOSIS — D500 Iron deficiency anemia secondary to blood loss (chronic): Secondary | ICD-10-CM

## 2023-11-08 DIAGNOSIS — C9210 Chronic myeloid leukemia, BCR/ABL-positive, not having achieved remission: Secondary | ICD-10-CM | POA: Diagnosis not present

## 2023-11-08 LAB — TYPE AND SCREEN
ABO/RH(D): A POS
Antibody Screen: NEGATIVE
Unit division: 0
Unit division: 0

## 2023-11-08 LAB — BPAM RBC
Blood Product Expiration Date: 202509062359
Blood Product Expiration Date: 202509062359
ISSUE DATE / TIME: 202508081503
ISSUE DATE / TIME: 202508090850
Unit Type and Rh: 6200
Unit Type and Rh: 6200

## 2023-11-08 MED ORDER — SODIUM CHLORIDE 0.9% IV SOLUTION
250.0000 mL | Freq: Once | INTRAVENOUS | Status: AC
  Administered 2023-11-08: 100 mL via INTRAVENOUS

## 2023-11-08 MED ORDER — SODIUM CHLORIDE 0.9% FLUSH: 10.0000 mL | INTRAVENOUS | Status: DC | PRN

## 2023-11-08 MED ORDER — ACETAMINOPHEN 325 MG PO TABS
650.0000 mg | ORAL_TABLET | Freq: Once | ORAL | Status: AC
  Administered 2023-11-08: 650 mg via ORAL
  Filled 2023-11-08: qty 2

## 2023-11-09 LAB — BCR-ABL1, CML/ALL, PCR, QUANT
E1A2 Transcript: <0.0032 %
Interpretation (BCRAL):: NEGATIVE
b2a2 transcript: <0.0032 %
b3a2 transcript: <0.0032 %

## 2023-11-10 ENCOUNTER — Telehealth: Payer: Self-pay

## 2023-11-10 ENCOUNTER — Telehealth: Payer: Self-pay | Admitting: Physician Assistant

## 2023-11-10 LAB — TYPE AND SCREEN
ABO/RH(D): A POS
Antibody Screen: NEGATIVE
Unit division: 0
Unit division: 0

## 2023-11-10 LAB — BPAM RBC
Blood Product Expiration Date: 202509062359
Blood Product Expiration Date: 202509062359
ISSUE DATE / TIME: 202508081503
ISSUE DATE / TIME: 202508090850
Unit Type and Rh: 6200
Unit Type and Rh: 6200

## 2023-11-10 NOTE — Telephone Encounter (Signed)
 Reviewed. Last hemoglobin was quite low compared to the previous. He has a history of bleeding ulcers. He is on a blood thinner. Multiple comorbidities and advanced age. I recommend that he go to the emergency room for evaluation.  They can check his blood counts as well as his stools.  If he has bleeding, he will need to be admitted. Thanks, Dr. Abran

## 2023-11-10 NOTE — Telephone Encounter (Signed)
 The pt was seen by oncology and blood transfusion was ordered as well as iron infusion 6 weeks ago.  He is worried that his stools are very dark to black.  He sees no overt bleeding but is concerned. He is on Eliquis  5 mg twice daily.   He has a call placed to oncology as well.  He would like Dr Abran to be informed about what is going on.  He has an appt with Ellouise on 9/24.    FYI Dr Abran

## 2023-11-10 NOTE — Telephone Encounter (Signed)
 Called patient to review recommendations from Dr. Federico and during the conversation, patient asked that I speak with his wife. Patient's wife stated that Dr. Abran has advised the patient to go to the ER for evaluation. She reported that they plan to go to the ER first thing in the morning. Encouraged wife to take patient to the ER now; however, she insisted on waiting until the morning.

## 2023-11-10 NOTE — Telephone Encounter (Signed)
 Called and advised that patient go to ER for evaluation per Dr. Abran. Patient verbalized understanding and had no concerns at the end of the call.

## 2023-11-10 NOTE — Telephone Encounter (Signed)
 Patient called and stated that he was given several units of blood on Saturday and states his Stool went from brown to [facility] and is worried he has a GI bleed some where. Patient is requesting to speak to the nurse. Please advise.

## 2023-11-10 NOTE — Telephone Encounter (Signed)
 Spoke with patient regarding changes in stool color. Patient reported that stools have been light brown recently and changed to black this morning. He reports having 1 black stool as of today.  He stated that he contacted his gastroenterologist's office to make them aware and to request an earlier appointment for evaluation and possible colonoscopy. Patient verbalized, "I know I'm bleeding somewhere." He reports having recently received units of blood and is feeling improved but still weak. Informed patient that this information will be relayed to Dr. Federico for review.

## 2023-11-11 ENCOUNTER — Encounter (HOSPITAL_COMMUNITY): Payer: Self-pay

## 2023-11-11 ENCOUNTER — Other Ambulatory Visit: Payer: Self-pay

## 2023-11-11 ENCOUNTER — Inpatient Hospital Stay (HOSPITAL_COMMUNITY)
Admission: EM | Admit: 2023-11-11 | Discharge: 2023-11-13 | DRG: 378 | Disposition: A | Discharge status: Home / Self Care | Source: Ambulatory Visit | Attending: Internal Medicine | Admitting: Internal Medicine

## 2023-11-11 DIAGNOSIS — I340 Nonrheumatic mitral (valve) insufficiency: Secondary | ICD-10-CM | POA: Diagnosis present

## 2023-11-11 DIAGNOSIS — Z7901 Long term (current) use of anticoagulants: Secondary | ICD-10-CM

## 2023-11-11 DIAGNOSIS — E039 Hypothyroidism, unspecified: Secondary | ICD-10-CM | POA: Diagnosis present

## 2023-11-11 DIAGNOSIS — Z91041 Radiographic dye allergy status: Secondary | ICD-10-CM

## 2023-11-11 DIAGNOSIS — I482 Chronic atrial fibrillation, unspecified: Secondary | ICD-10-CM | POA: Diagnosis present

## 2023-11-11 DIAGNOSIS — H9192 Unspecified hearing loss, left ear: Secondary | ICD-10-CM | POA: Diagnosis present

## 2023-11-11 DIAGNOSIS — Z7984 Long term (current) use of oral hypoglycemic drugs: Secondary | ICD-10-CM | POA: Diagnosis not present

## 2023-11-11 DIAGNOSIS — Z953 Presence of xenogenic heart valve: Secondary | ICD-10-CM

## 2023-11-11 DIAGNOSIS — G4733 Obstructive sleep apnea (adult) (pediatric): Secondary | ICD-10-CM | POA: Diagnosis present

## 2023-11-11 DIAGNOSIS — Z96652 Presence of left artificial knee joint: Secondary | ICD-10-CM | POA: Diagnosis not present

## 2023-11-11 DIAGNOSIS — E785 Hyperlipidemia, unspecified: Secondary | ICD-10-CM | POA: Diagnosis not present

## 2023-11-11 DIAGNOSIS — D62 Acute posthemorrhagic anemia: Secondary | ICD-10-CM | POA: Diagnosis present

## 2023-11-11 DIAGNOSIS — I5032 Chronic diastolic (congestive) heart failure: Secondary | ICD-10-CM | POA: Diagnosis not present

## 2023-11-11 DIAGNOSIS — E1122 Type 2 diabetes mellitus with diabetic chronic kidney disease: Secondary | ICD-10-CM | POA: Diagnosis present

## 2023-11-11 DIAGNOSIS — K2289 Other specified disease of esophagus: Secondary | ICD-10-CM | POA: Diagnosis not present

## 2023-11-11 DIAGNOSIS — J439 Emphysema, unspecified: Secondary | ICD-10-CM | POA: Diagnosis present

## 2023-11-11 DIAGNOSIS — Z820 Family history of epilepsy and other diseases of the nervous system: Secondary | ICD-10-CM

## 2023-11-11 DIAGNOSIS — K449 Diaphragmatic hernia without obstruction or gangrene: Secondary | ICD-10-CM | POA: Diagnosis present

## 2023-11-11 DIAGNOSIS — E1165 Type 2 diabetes mellitus with hyperglycemia: Secondary | ICD-10-CM | POA: Diagnosis not present

## 2023-11-11 DIAGNOSIS — Z87891 Personal history of nicotine dependence: Secondary | ICD-10-CM

## 2023-11-11 DIAGNOSIS — Z8261 Family history of arthritis: Secondary | ICD-10-CM | POA: Diagnosis not present

## 2023-11-11 DIAGNOSIS — I110 Hypertensive heart disease with heart failure: Secondary | ICD-10-CM | POA: Diagnosis not present

## 2023-11-11 DIAGNOSIS — N182 Chronic kidney disease, stage 2 (mild): Secondary | ICD-10-CM | POA: Diagnosis present

## 2023-11-11 DIAGNOSIS — Z79890 Hormone replacement therapy (postmenopausal): Secondary | ICD-10-CM

## 2023-11-11 DIAGNOSIS — F418 Other specified anxiety disorders: Secondary | ICD-10-CM | POA: Diagnosis present

## 2023-11-11 DIAGNOSIS — K5731 Diverticulosis of large intestine without perforation or abscess with bleeding: Secondary | ICD-10-CM | POA: Diagnosis present

## 2023-11-11 DIAGNOSIS — Z8249 Family history of ischemic heart disease and other diseases of the circulatory system: Secondary | ICD-10-CM

## 2023-11-11 DIAGNOSIS — I130 Hypertensive heart and chronic kidney disease with heart failure and stage 1 through stage 4 chronic kidney disease, or unspecified chronic kidney disease: Secondary | ICD-10-CM | POA: Diagnosis not present

## 2023-11-11 DIAGNOSIS — Z822 Family history of deafness and hearing loss: Secondary | ICD-10-CM

## 2023-11-11 DIAGNOSIS — Z856 Personal history of leukemia: Secondary | ICD-10-CM

## 2023-11-11 DIAGNOSIS — K219 Gastro-esophageal reflux disease without esophagitis: Secondary | ICD-10-CM | POA: Diagnosis present

## 2023-11-11 DIAGNOSIS — Z888 Allergy status to other drugs, medicaments and biological substances status: Secondary | ICD-10-CM | POA: Diagnosis not present

## 2023-11-11 DIAGNOSIS — D500 Iron deficiency anemia secondary to blood loss (chronic): Secondary | ICD-10-CM | POA: Diagnosis not present

## 2023-11-11 DIAGNOSIS — K5521 Angiodysplasia of colon with hemorrhage: Principal | ICD-10-CM | POA: Diagnosis present

## 2023-11-11 DIAGNOSIS — I4892 Unspecified atrial flutter: Secondary | ICD-10-CM | POA: Diagnosis present

## 2023-11-11 DIAGNOSIS — Z961 Presence of intraocular lens: Secondary | ICD-10-CM | POA: Diagnosis present

## 2023-11-11 DIAGNOSIS — K5520 Angiodysplasia of colon without hemorrhage: Secondary | ICD-10-CM | POA: Diagnosis not present

## 2023-11-11 DIAGNOSIS — B3781 Candidal esophagitis: Secondary | ICD-10-CM | POA: Diagnosis not present

## 2023-11-11 DIAGNOSIS — Z883 Allergy status to other anti-infective agents status: Secondary | ICD-10-CM

## 2023-11-11 DIAGNOSIS — K921 Melena: Secondary | ICD-10-CM | POA: Diagnosis not present

## 2023-11-11 DIAGNOSIS — K5730 Diverticulosis of large intestine without perforation or abscess without bleeding: Secondary | ICD-10-CM | POA: Diagnosis not present

## 2023-11-11 DIAGNOSIS — I10 Essential (primary) hypertension: Secondary | ICD-10-CM | POA: Diagnosis not present

## 2023-11-11 DIAGNOSIS — Z79899 Other long term (current) drug therapy: Secondary | ICD-10-CM | POA: Diagnosis not present

## 2023-11-11 DIAGNOSIS — H401131 Primary open-angle glaucoma, bilateral, mild stage: Secondary | ICD-10-CM | POA: Diagnosis present

## 2023-11-11 DIAGNOSIS — Z885 Allergy status to narcotic agent status: Secondary | ICD-10-CM

## 2023-11-11 DIAGNOSIS — I2510 Atherosclerotic heart disease of native coronary artery without angina pectoris: Secondary | ICD-10-CM | POA: Diagnosis present

## 2023-11-11 DIAGNOSIS — Z8619 Personal history of other infectious and parasitic diseases: Secondary | ICD-10-CM

## 2023-11-11 DIAGNOSIS — Q2733 Arteriovenous malformation of digestive system vessel: Secondary | ICD-10-CM | POA: Diagnosis not present

## 2023-11-11 LAB — CBC WITH DIFFERENTIAL/PLATELET
Abs Immature Granulocytes: 0.01 K/uL (ref 0.00–0.07)
Basophils Absolute: 0 K/uL (ref 0.0–0.1)
Basophils Relative: 1 %
Eosinophils Absolute: 0.1 K/uL (ref 0.0–0.5)
Eosinophils Relative: 2 %
HCT: 26.9 % — ABNORMAL LOW (ref 39.0–52.0)
Hemoglobin: 7.7 g/dL — ABNORMAL LOW (ref 13.0–17.0)
Immature Granulocytes: 0 %
Lymphocytes Relative: 15 %
Lymphs Abs: 0.7 K/uL (ref 0.7–4.0)
MCH: 22.6 pg — ABNORMAL LOW (ref 26.0–34.0)
MCHC: 28.6 g/dL — ABNORMAL LOW (ref 30.0–36.0)
MCV: 79.1 fL — ABNORMAL LOW (ref 80.0–100.0)
Monocytes Absolute: 0.4 K/uL (ref 0.1–1.0)
Monocytes Relative: 10 %
Neutro Abs: 3.3 K/uL (ref 1.7–7.7)
Neutrophils Relative %: 72 %
Platelets: 246 K/uL (ref 150–400)
RBC: 3.4 MIL/uL — ABNORMAL LOW (ref 4.22–5.81)
RDW: 19.7 % — ABNORMAL HIGH (ref 11.5–15.5)
WBC: 4.6 K/uL (ref 4.0–10.5)
nRBC: 0 % (ref 0.0–0.2)

## 2023-11-11 LAB — COMPREHENSIVE METABOLIC PANEL WITH GFR
ALT: 13 U/L (ref 0–44)
AST: 21 U/L (ref 15–41)
Albumin: 3.5 g/dL (ref 3.5–5.0)
Alkaline Phosphatase: 70 U/L (ref 38–126)
Anion gap: 11 (ref 5–15)
BUN: 28 mg/dL — ABNORMAL HIGH (ref 8–23)
CO2: 25 mmol/L (ref 22–32)
Calcium: 9.2 mg/dL (ref 8.9–10.3)
Chloride: 100 mmol/L (ref 98–111)
Creatinine, Ser: 1.48 mg/dL — ABNORMAL HIGH (ref 0.61–1.24)
GFR, Estimated: 46 mL/min — ABNORMAL LOW (ref >60)
Glucose, Bld: 210 mg/dL — ABNORMAL HIGH (ref 70–99)
Potassium: 4.3 mmol/L (ref 3.5–5.1)
Sodium: 136 mmol/L (ref 135–145)
Total Bilirubin: 0.9 mg/dL (ref 0.0–1.2)
Total Protein: 7.4 g/dL (ref 6.5–8.1)

## 2023-11-11 LAB — GLUCOSE, CAPILLARY
Glucose-Capillary: 238 mg/dL — ABNORMAL HIGH (ref 70–99)
Glucose-Capillary: 156 mg/dL — ABNORMAL HIGH (ref 70–99)

## 2023-11-11 LAB — POC OCCULT BLOOD, ED: Fecal Occult Bld: NEGATIVE

## 2023-11-11 LAB — PREPARE RBC (CROSSMATCH)

## 2023-11-11 LAB — PROTIME-INR
INR: 1.7 — ABNORMAL HIGH (ref 0.8–1.2)
Prothrombin Time: 20.6 s — ABNORMAL HIGH (ref 11.4–15.2)

## 2023-11-11 LAB — LIPASE, BLOOD: Lipase: 35 U/L (ref 11–51)

## 2023-11-11 LAB — MAGNESIUM: Magnesium: 2.4 mg/dL (ref 1.7–2.4)

## 2023-11-11 MED ORDER — NILOTINIB HCL 150 MG PO CAPS: 150.0000 mg | ORAL_CAPSULE | Freq: Two times a day (BID) | ORAL | Status: DC

## 2023-11-11 MED ORDER — SODIUM CHLORIDE 0.9% IV SOLUTION: Freq: Once | INTRAVENOUS | Status: AC

## 2023-11-11 MED ORDER — ONDANSETRON HCL 4 MG/2ML IJ SOLN: 4.0000 mg | Freq: Four times a day (QID) | INTRAMUSCULAR | Status: DC | PRN

## 2023-11-11 MED ORDER — PAROXETINE HCL 20 MG PO TABS
30.0000 mg | ORAL_TABLET | Freq: Every day | ORAL | Status: DC
  Filled 2023-11-11 (×2): qty 1

## 2023-11-11 MED ORDER — ACETAMINOPHEN 325 MG PO TABS: 650.0000 mg | ORAL_TABLET | Freq: Four times a day (QID) | ORAL | Status: DC | PRN

## 2023-11-11 MED ORDER — ROSUVASTATIN CALCIUM 10 MG PO TABS: 10.0000 mg | ORAL_TABLET | Freq: Every day | ORAL | Status: DC

## 2023-11-11 MED ORDER — ACETAMINOPHEN 650 MG RE SUPP: 650.0000 mg | Freq: Four times a day (QID) | RECTAL | Status: DC | PRN

## 2023-11-11 MED ORDER — METFORMIN HCL ER 500 MG PO TB24
500.0000 mg | ORAL_TABLET | Freq: Two times a day (BID) | ORAL | Status: DC
  Filled 2023-11-11: qty 1

## 2023-11-11 MED ORDER — ONDANSETRON HCL 4 MG PO TABS
4.0000 mg | ORAL_TABLET | Freq: Four times a day (QID) | ORAL | Status: DC | PRN
Start: 2023-11-11 — End: 2023-11-13

## 2023-11-11 MED ORDER — LATANOPROST 0.005 % OP SOLN
1.0000 [drp] | Freq: Every day | OPHTHALMIC | Status: DC
  Administered 2023-11-11 – 2023-11-12 (×4): 1 [drp] via OPHTHALMIC
  Filled 2023-11-11: qty 2.5

## 2023-11-11 MED ORDER — EMPAGLIFLOZIN 10 MG PO TABS
10.0000 mg | ORAL_TABLET | Freq: Every day | ORAL | Status: DC
  Filled 2023-11-11 (×2): qty 1

## 2023-11-11 MED ORDER — TIMOLOL MALEATE 0.5 % OP SOLN
1.0000 [drp] | Freq: Every day | OPHTHALMIC | Status: DC
  Administered 2023-11-12 – 2023-11-13 (×3): 1 [drp] via OPHTHALMIC
  Filled 2023-11-11: qty 5

## 2023-11-11 MED ORDER — PANTOPRAZOLE SODIUM 40 MG IV SOLR
40.0000 mg | Freq: Two times a day (BID) | INTRAVENOUS | Status: DC
  Administered 2023-11-11 – 2023-11-13 (×9): 40 mg via INTRAVENOUS
  Filled 2023-11-11 (×5): qty 10

## 2023-11-11 MED ORDER — LEVOTHYROXINE SODIUM 50 MCG PO TABS: 175.0000 ug | ORAL_TABLET | Freq: Every day | ORAL | Status: DC

## 2023-11-11 MED ORDER — INSULIN ASPART 100 UNIT/ML IJ SOLN
0.0000 [IU] | Freq: Three times a day (TID) | INTRAMUSCULAR | Status: DC
  Administered 2023-11-12 (×2): 1 [IU] via SUBCUTANEOUS

## 2023-11-11 MED ORDER — FINASTERIDE 5 MG PO TABS
5.0000 mg | ORAL_TABLET | Freq: Every day | ORAL | Status: DC
  Administered 2023-11-11 – 2023-11-12 (×4): 5 mg via ORAL
  Filled 2023-11-11 (×2): qty 1

## 2023-11-11 MED ORDER — TRAZODONE HCL 50 MG PO TABS
75.0000 mg | ORAL_TABLET | Freq: Every day | ORAL | Status: DC
  Administered 2023-11-11 (×2): 75 mg via ORAL
  Filled 2023-11-11: qty 2

## 2023-11-11 MED ORDER — MIRABEGRON ER 25 MG PO TB24
50.0000 mg | ORAL_TABLET | Freq: Every day | ORAL | Status: DC
  Filled 2023-11-11 (×2): qty 2

## 2023-11-11 NOTE — ED Triage Notes (Addendum)
 Patient sent from PCP. He said he has had dark red blood in his stool that has gotten darker over 2 weeks. Feels light headed, dizzy, short of breath. Had a blood transfusion last week. Denies abdominal pain or vomiting. Took eloquis this morning.

## 2023-11-11 NOTE — H&P (View-Only) (Signed)
 Referring Provider: Dr. Bernardino Fireman  Primary Care Physician:  [name], MD Primary Gastroenterologist:  Dr. Norleen Kiang   Reason for Consultation:  Anemia, melena   HPI: [name] White is a 88 y.o. male with a past medical history of anxiety, depression, hypertension, nonobstructive coronary artery disease, severe AS s/p TAVR 04/2019, MR, atrial flutter s/p ablation 2010, chronic diastolic CHF with LV EF 50 - 55%, diabetes mellitus type 2, OSA, lymphocytic leukemia, remote history of colon polyps in the 1990s, GERD, large hiatal hernia, gastritis and a duodenal ulcer.  Patient presented to the ED with persistent melena and anemia.  Labs in the ED showed a Hg level of 7.7. Hg 6.5 on 8/6, received 1 unit of PRBCs per oncology on 8/8 and 8/9 as an outpatient. HCT 26.9.  Platelets 246.  INR 1.7.  BUN 28.  Creatinine 1.48.  Total bili 0.9.  Alk phos 70.  AST 21.  ALT 13.  Lipase 35. GI consult was requested for further evaluation regarding suspected upper GI bleed with acute on chronic anemia.  He denies having any nausea or vomiting.  No heartburn or dysphagia.  He denies taking acid reducing medication at home, however, his wife verified that he has been taking Pantoprazole  40mg  once daily and a refill is ready for pick up at their pharmacy. No upper or lower abdominal pain.  He endorses passing dark stools for the past year and specifically noted passing black stools x 2 days prior to arriving to the ED.  No BM or melena today.  No bright red blood per the rectum.  He takes Eliquis  twice daily, last dose was today at 6 AM.  No NSAIDs.  He was previously seen in our outpatient GI clinic 09/02/2023 for further evaluation regarding IDA.  Stool was heme positive and his hemoglobin level 7.2 therefore he was sent to the ED for further evaluation. He received a blood transfusion and he was hemodynamically stable therefore he was discharged home. He underwent an outpatient EGD at [facility]  09/05/2023 which identified nonerosive esophagitis, a large hiatal hernia without erosions, superficial antral erosions and a duodenal bulb ulcer without stigmata of recent bleeding. Path report was negative for H. pylori.  He was discharged home with instructions to avoid NSAIDs and to take Pantoprazole  40 mg indefinitely.  He has a history of chronic iron deficiency anemia followed by hematologist Dr. Federico. He received IV iron x 5 infusions 3 months ago. Stopped taking oral iron  6 to 12 months ago.  He also has CML which was stable at the time of his last office visit with Dr. Federico 10/01/2023.  ECHO 04/07/2023: Left ventricular ejection fraction, by estimation, is 50 to 55%. The left ventricle has low normal function. 1. RVSP mildly elevated but likely underestimates true PA pressures.. Right ventricular systolic function is normal. The right ventricular size is normal. There is mildly elevated pulmonary artery systolic pressure. 2. Left atrial size was moderately dilated. No left atrial/left atrial appendage thrombus was detected. 3. 4. Right atrial size was mildly dilated. Severe mitral regurgitation with reversal of flow in the pulmonary veins. MVA 6.1 cm2. EROA 0.75cm2, MR volume . Severely restricted posterior leaflet measured at around 1cm. Will refer for assessment of mTEER.. The mitral valve is abnormal. Severe mitral valve regurgitation. No evidence of mitral stenosis. The mean mitral valve gradient is 3.0 mmHg. 5. 6. Tricuspid valve regurgitation is mild to moderate. 7. The aortic valve has been  repaired/replaced. Aortic valve regurgitation is not  Cardiac catheterization 06/19/2023: 1.  Patent coronary arteries with nonobstructive CAD as detailed below 2.  Normal TAVR function with peak to peak gradient of 9 mmHg 3.  Large V waves of 34 mmHg suggestive of hemodynamically significant mitral regurgitation 4.  Mean pulmonary artery pressure 31 mmHg, transpulmonary gradient 10 mmHg, PVR 1.37  Wood units.   Recommendations: Valve team review for consideration of transcatheter edge-to-edge repair of the mitral valve  MOST RECENT GI PROCEDURES:  EGD 09/05/2023 secondary to IDA. 1. Nonerosive esophagitis  2. Moderately large hiatal hernia without erosions  3. Superficial antral erosions 4. Duodenal bulb ulcer without stigmata. Status post biopsies of the gastric antrum to rule out  H.pylori. A. STOMACH, ANTRUM, BIOPSY:  - Antral mucosa with reactive gastritis and features consistent with  healing mucosal injury.  - Negative for H. pylori, intestinal metaplasia, dysplasia, and  malignancy.   EGD 12/05/2015: Scattered polyps throughout the stomach, likely fundic gland polyp 1 cm hiatal hernia   Colonoscopy 12/05/2015: Mild diverticulosis found in the sigmoid colon No polyps  Colonoscopy 03/09/2009 Diverticulosis, no polyps  EGD 05/05/2006: GERD with esophagitis   Past Medical History:  Diagnosis Date   Anemia 06/04/2015   Anxiety    Atrial flutter (HCC)    s/p ablation 07/17/08   Cancer (HCC)    leukemia - dx 05/2018   Chronic diastolic CHF (congestive heart failure) (HCC)    Coronary artery disease    Depression    Diabetes mellitus type 2 in obese 04/10/2009   Qualifier: Diagnosis of  By: Mavis MD, Norleen BRAVO, diet controlled, lost 60lbs   Emphysema of lung (HCC)    GERD (gastroesophageal reflux disease)    Glaucoma    recent dx - has an appt to be evaluated and meds to treat   H/O hiatal hernia    Hearing loss    some hearing loss in left ear but has not been dx, no hearing aids   Hepatitis    h/o of type A   History of measles as a child    History of mumps as a child    Hypertension    Hypothyroidism    Micturition syncope    Osteoarthritis    Pleural effusion, bilateral    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   26 mm Edwards Sapien 3 transcatheter heart valve placed via percutaneous right transfemoral approach    Severe aortic  stenosis    Sleep apnea    uses CPAP nightly   Vitamin D  deficiency 11/24/2015    Past Surgical History:  Procedure Laterality Date   CARDIAC CATHETERIZATION  03/18/2019   CARDIAC ELECTROPHYSIOLOGY MAPPING AND ABLATION  06/2008   CARDIAC VALVE REPLACEMENT  04/13/2019   CARDIOVERSION N/A 04/07/2023   Procedure: CARDIOVERSION;  Surgeon: Zenaida Morene PARAS, MD;  Location: [facility] INVASIVE CV LAB;  Service: Cardiovascular;  Laterality: N/A;   CATARACT EXTRACTION W/ INTRAOCULAR LENS  IMPLANT, BILATERAL  ~ 2007   COLONOSCOPY     ESOPHAGOGASTRODUODENOSCOPY N/A 09/05/2023   Procedure: EGD (ESOPHAGOGASTRODUODENOSCOPY);  Surgeon: Abran Norleen SAILOR, MD;  Location: [facility] ENDOSCOPY;  Service: Gastroenterology;  Laterality: N/A;   EYE SURGERY     FOOT TENDON SURGERY Left    INTRAOPERATIVE TRANSTHORACIC ECHOCARDIOGRAM N/A 04/13/2019   Procedure: TRANSTHORACIC ECHOCARDIOGRAM;  Surgeon: Wonda Sharper, MD;  Location: [facility] OR;  Service: Open Heart Surgery;  Laterality: N/A;   JOINT REPLACEMENT  2005 /  2010   RIGHT/LEFT HEART CATH  AND CORONARY ANGIOGRAPHY N/A 03/18/2019   Procedure: RIGHT/LEFT HEART CATH AND CORONARY ANGIOGRAPHY;  Surgeon: Cherrie Toribio SAUNDERS, MD;  Location: [facility] INVASIVE CV LAB;  Service: Cardiovascular;  Laterality: N/A;   RIGHT/LEFT HEART CATH AND CORONARY ANGIOGRAPHY N/A 06/19/2023   Procedure: RIGHT/LEFT HEART CATH AND CORONARY ANGIOGRAPHY;  Surgeon: Wonda Sharper, MD;  Location: [facility] INVASIVE CV LAB;  Service: Cardiovascular;  Laterality: N/A;   TONSILLECTOMY  1940's   TOTAL KNEE ARTHROPLASTY  1990's   right   TOTAL KNEE ARTHROPLASTY Left 08/02/2013   Procedure: LEFT TOTAL KNEE ARTHROPLASTY;  Surgeon: Dempsey LULLA Moan, MD;  Location: [facility] ORS;  Service: Orthopedics;  Laterality: Left;   TRANSCATHETER AORTIC VALVE REPLACEMENT, TRANSFEMORAL N/A 04/13/2019   Procedure: TRANSCATHETER AORTIC VALVE REPLACEMENT, TRANSFEMORAL;  Surgeon: Wonda Sharper, MD;  Location: [facility] OR;  Service: Open Heart Surgery;   Laterality: N/A;   TRANSESOPHAGEAL ECHOCARDIOGRAM (CATH LAB) N/A 04/07/2023   Procedure: TRANSESOPHAGEAL ECHOCARDIOGRAM;  Surgeon: Zenaida Morene PARAS, MD;  Location: [facility] INVASIVE CV LAB;  Service: Cardiovascular;  Laterality: N/A;   UPPER GI ENDOSCOPY     WISDOM TOOTH EXTRACTION      Prior to Admission medications   Medication Sig Start Date End Date Taking? Authorizing Provider  acetaminophen  (TYLENOL ) 500 MG tablet Take 500 mg by mouth every 6 (six) hours as needed for moderate pain (pain score 4-6).    [provider]  apixaban  (ELIQUIS ) 5 MG TABS tablet Take 5 mg by mouth 2 (two) times daily. 12/09/22   [provider]  carboxymethylcellulose (REFRESH PLUS) 0.5 % SOLN Place 1 drop into both eyes as needed (dry eyes).    [provider]  cholecalciferol (VITAMIN D3) 25 MCG (1000 UNIT) tablet Take 1,000 Units by mouth at bedtime.    [provider]  CINNAMON PO Take 2,000 mg by mouth 2 (two) times daily.     [provider]  Coenzyme Q10 ([employer] COQ-10) 200 MG capsule Take 200 mg by mouth every evening.     [provider]  empagliflozin  (JARDIANCE ) 25 MG TABS tablet Take 25 mg by mouth daily.    [provider]  finasteride  (PROSCAR ) 5 MG tablet Take 5 mg by mouth every evening.    [provider]  furosemide  (LASIX ) 20 MG tablet TAKE 2 TABLETS (40 MG TOTAL) BY MOUTH DAILY. 06/04/23   Bensimhon, Toribio SAUNDERS, MD  latanoprost  (XALATAN ) 0.005 % ophthalmic solution Place 1 drop into both eyes at bedtime.    [provider]  levothyroxine  (SYNTHROID ) 175 MCG tablet Take 175 mcg by mouth daily before breakfast.    [provider]  loratadine  (CLARITIN ) 10 MG tablet Take 10 mg by mouth daily.    [provider]  metFORMIN  (GLUCOPHAGE -XR) 500 MG 24 hr tablet Take 500 mg by mouth 2 (two) times daily.    [provider]  mirabegron  ER (MYRBETRIQ ) 50 MG TB24 tablet Take 50 mg by mouth daily. 01/21/23    [provider]  nilotinib  (TASIGNA ) 150 MG capsule Take 1 capsule (150 mg total) by mouth 2 (two) times daily. Take on an empty stomach, 1 hr before or 2 hrs after food. 10/22/23   Plotnikov, Aleksei V, MD  pantoprazole  (PROTONIX ) 40 MG tablet TAKE 1 TABLET (40 MG TOTAL) BY MOUTH TWICE A DAY BEFORE MEALS 11/10/23   [place], PA-C  PARoxetine  (PAXIL ) 40 MG tablet Take 1 tablet (40 mg total) by mouth every morning. 09/14/13   Jenkins, John E, MD  rosuvastatin  (CRESTOR )  10 MG tablet TAKE 1 TABLET (10 MG TOTAL) BY MOUTH DAILY. PLEASE SCHEDULE AN APPOINTMENT FOR FURTHER REFILLS 10/13/23   Zenaida Morene PARAS, MD  spironolactone  (ALDACTONE ) 25 MG tablet Take 25 mg by mouth daily.    [provider]  timolol  (BETIMOL ) 0.5 % ophthalmic solution Place 1 drop into both eyes daily.    [provider]  traZODone  (DESYREL ) 50 MG tablet Take 50 mg by mouth at bedtime.    [provider]  TURMERIC PO Take 1,000 mg by mouth every evening.     [provider]    Current Facility-Administered Medications  Medication Dose Route Frequency Provider Last Rate Last Admin   pantoprazole  (PROTONIX ) injection 40 mg  40 mg Intravenous Q12H Melvenia Motto, MD       Current Outpatient Medications  Medication Sig Dispense Refill   acetaminophen  (TYLENOL ) 500 MG tablet Take 500 mg by mouth every 6 (six) hours as needed for moderate pain (pain score 4-6).     apixaban  (ELIQUIS ) 5 MG TABS tablet Take 5 mg by mouth 2 (two) times daily.     carboxymethylcellulose (REFRESH PLUS) 0.5 % SOLN Place 1 drop into both eyes as needed (dry eyes).     cholecalciferol (VITAMIN D3) 25 MCG (1000 UNIT) tablet Take 1,000 Units by mouth at bedtime.     CINNAMON PO Take 2,000 mg by mouth 2 (two) times daily.      Coenzyme Q10 (CVS COQ-10) 200 MG capsule Take 200 mg by mouth every evening.      empagliflozin  (JARDIANCE ) 25 MG TABS tablet Take 25 mg by mouth daily.     finasteride  (PROSCAR ) 5 MG tablet  Take 5 mg by mouth every evening.     furosemide  (LASIX ) 20 MG tablet TAKE 2 TABLETS (40 MG TOTAL) BY MOUTH DAILY. 180 tablet 1   latanoprost  (XALATAN ) 0.005 % ophthalmic solution Place 1 drop into both eyes at bedtime.     levothyroxine  (SYNTHROID ) 175 MCG tablet Take 175 mcg by mouth daily before breakfast.     loratadine  (CLARITIN ) 10 MG tablet Take 10 mg by mouth daily.     metFORMIN  (GLUCOPHAGE -XR) 500 MG 24 hr tablet Take 500 mg by mouth 2 (two) times daily.     mirabegron  ER (MYRBETRIQ ) 50 MG TB24 tablet Take 50 mg by mouth daily.     nilotinib  (TASIGNA ) 150 MG capsule Take 1 capsule (150 mg total) by mouth 2 (two) times daily. Take on an empty stomach, 1 hr before or 2 hrs after food. 60 capsule 1   pantoprazole  (PROTONIX ) 40 MG tablet TAKE 1 TABLET (40 MG TOTAL) BY MOUTH TWICE A DAY BEFORE MEALS 180 tablet 0   PARoxetine  (PAXIL ) 40 MG tablet Take 1 tablet (40 mg total) by mouth every morning. 90 tablet 1   rosuvastatin  (CRESTOR ) 10 MG tablet TAKE 1 TABLET (10 MG TOTAL) BY MOUTH DAILY. PLEASE SCHEDULE AN APPOINTMENT FOR FURTHER REFILLS 90 tablet 3   spironolactone  (ALDACTONE ) 25 MG tablet Take 25 mg by mouth daily.     timolol  (BETIMOL ) 0.5 % ophthalmic solution Place 1 drop into both eyes daily.     traZODone  (DESYREL ) 50 MG tablet Take 50 mg by mouth at bedtime.     TURMERIC PO Take 1,000 mg by mouth every evening.       Allergies as of 11/11/2023 - Review Complete 11/11/2023  Allergen Reaction Noted   Ace inhibitors Anaphylaxis and Swelling 08/26/2013   Benazepril  Anaphylaxis and Swelling 02/01/2014  Codeine Itching and Rash    Statins Other (See Comments)    Piroxicam Hives 07/19/2013   Pseudoephedrine Other (See Comments) 07/19/2013   Brinzolamide-brimonidine  10/01/2021   Dasatinib   04/20/2019   Betadine [povidone iodine ] Hives and Rash 04/15/2019    Family History  Problem Relation Age of Onset   Multiple sclerosis Father    Arthritis Father    Hearing loss Father     Heart disease Mother        atrial fibrillation   Hypertension Son    Heart disease Maternal Grandfather     Social History   Socioeconomic History   Marital status: Married    Spouse name: [name]   Number of children: 2   Years of education: Not on file   Highest education level: Not on file  Occupational History   Occupation: Retired  Tobacco Use   Smoking status: Former    Types: Pipe, Cigars    Quit date: 04/01/1996    Years since quitting: 27.6   Smokeless tobacco: Former    Types: Chew    Quit date: 04/01/2009  Vaping Use   Vaping status: Never Used  Substance and Sexual Activity   Alcohol use: Not Currently    Alcohol/week: 0.0 standard drinks of alcohol    Comment: Quit wine early 2020.   Drug use: No   Sexual activity: Not Currently    Comment: lives with wife, eats clean diet. No major dietary restrtictions,   Other Topics Concern   Not on file  Social History Narrative   Live in home with wife.   Social Drivers of Corporate investment banker Strain: Low Risk  (02/20/2023)   Overall Financial Resource Strain (CARDIA)    Difficulty of Paying Living Expenses: Not hard at all  Food Insecurity: No Food Insecurity (10/01/2023)   Hunger Vital Sign    Worried About Running Out of Food in the Last Year: Never true    Ran Out of Food in the Last Year: Never true  Transportation Needs: No Transportation Needs (10/01/2023)   PRAPARE - Administrator, Civil Service (Medical): No    Lack of Transportation (Non-Medical): No  Physical Activity: Insufficiently Active (02/20/2023)   Exercise Vital Sign    Days of Exercise per Week: 7 days    Minutes of Exercise per Session: 20 min  Stress: No Stress Concern Present (02/20/2023)   [employer] of Occupational Health - Occupational Stress Questionnaire    Feeling of Stress : Not at all  Social Connections: Moderately Isolated (02/20/2023)   Social Connection and Isolation Panel    Frequency of  Communication with Friends and Family: Three times a week    Frequency of Social Gatherings with Friends and Family: Once a week    Attends Religious Services: Never    Database administrator or Organizations: No    Attends Banker Meetings: Never    Marital Status: Married  Catering manager Violence: Not At Risk (10/01/2023)   Humiliation, Afraid, Rape, and Kick questionnaire    Fear of Current or Ex-Partner: No    Emotionally Abused: No    Physically Abused: No    Sexually Abused: No   Review of Systems: Gen: Denies fever, sweats or chills. No weight loss.  CV: Denies chest pain, palpitations or edema. Resp: Denies cough, shortness of breath of hemoptysis.  GI: See HPI.  GU : Denies urinary burning, blood in urine, increased urinary frequency or incontinence. MS:  Denies joint pain, muscles aches or weakness. Derm: Denies rash, itchiness, skin lesions or unhealing ulcers. Psych: Denies depression, anxiety, memory loss or confusion. Heme: Denies easy bruising, bleeding. Neuro:  Denies headaches, dizziness or paresthesias. Endo:  Denies any problems with DM, thyroid  or adrenal function.  Physical Exam: Vital signs in last 24 hours: Temp:  [97.8 F (36.6 C)-98.8 F (37.1 C)] 98.8 F (37.1 C) (08/12 1020) Pulse Rate:  [70-78] 70 (08/12 1020) Resp:  [16-18] 18 (08/12 1020) BP: (98-103)/(63) 103/63 (08/12 1020) SpO2:  [95 %-98 %] 95 % (08/12 1020) Weight:  [98 kg] 98 kg (08/12 0853)   General: Alert 88 year old male in no acute distress. Head:  Normocephalic and atraumatic. Eyes:  No scleral icterus. Conjunctiva pink. Ears:  Normal auditory acuity. Nose:  No deformity, discharge or lesions. Mouth: Dentition intact. No ulcers or lesions.  Neck:  Supple. No lymphadenopathy or thyromegaly.  Lungs: Breath sounds clear throughout. No wheezes, rhonchi or crackles.  Heart: Regular rhythm, 2/6 systolic murmur. Abdomen: Soft, nondistended.  Nontender.  Bowel sounds to all  4 quadrants.  No bruit.  No palpable mass. Rectal: Deferred. Rectal exam per ED physician was negative for melena or blood in the rectum.  FOBT reported as negative. Musculoskeletal:  Symmetrical without gross deformities.  Pulses:  Normal pulses noted. Extremities:  Without clubbing or edema. Neurologic:  Alert and oriented x 4. No focal deficits.  Skin:  Intact without significant lesions or rashes. Psych:  Alert and cooperative. Normal mood and affect.  Intake/Output from previous day: No intake/output data recorded. Intake/Output this shift: No intake/output data recorded.  Lab Results: Recent Labs    11/11/23 0942  WBC 4.6  HGB 7.7*  HCT 26.9*  PLT 246   BMET Recent Labs    11/11/23 0942  NA 136  K 4.3  CL 100  CO2 25  GLUCOSE 210*  BUN 28*  CREATININE 1.48*  CALCIUM  9.2   LFT Recent Labs    11/11/23 0942  PROT 7.4  ALBUMIN 3.5  AST 21  ALT 13  ALKPHOS 70  BILITOT 0.9   PT/INR Recent Labs    11/11/23 0942  LABPROT 20.6*  INR 1.7*   Hepatitis Panel No results for input(s): HEPBSAG, HCVAB, HEPAIGM, HEPBIGM in the last 72 hours.    Studies/Results: No results found.  IMPRESSION/PLAN:  88 year old male with acute on chronic IDA with chronically dark stools and melena x 2 days. Suspect, UGI bleed. Hg 7.7 ( Hg 6.5 on 8/6, transfused 2 units of PRBCs 8/8 - 8/9 per oncology as an outpatient). One unit of PRBCS ordered, not yet transfused. No melena today. Hemodynamically stable.  - Clear liquid diet - NPO after midnight - EGD benefits and risks discussed including risk with sedation, risk of bleeding, perforation and infection, timing to be determined. Await further recommendations per Dr. Stacia. - Check H/H post transfusion then Q 6 hrs x 24 hours - Transfuse for Hg < 8 - Continue to hold Eliquis    GERD, duodenal ulcer and hiatal hernia. EGD 09/05/2023 showed nonerosive esophagitis, antral erosions, duodenal bulb ulcer without stigmata  of recent bleeding.  Patient was prescribed Pantoprazole  40 mg daily indefinitely following his EGD 08/2023.  -See plan above   Nonobstructive CAD, no angina  AS s/p TAVR 2021. Severe MR, given minimal symptoms and calcified leaflet cardiology holding off on mitral valve repair   Atrial flutter s/p ablation on Eliquis , last  dose 6am -Continue to hold Eliquis   -Consider  Watchman's procedure, follow-up with cardiology as an outpatient  Chronic diastolic CHF  CML - Follow-up with oncology as an outpatient  Renal insufficiency. Cr 1.48   Elida HERO Kennedy-Smith  11/11/2023, 12:53 PM

## 2023-11-11 NOTE — H&P (Signed)
 History and Physical    Patient: [name] White FMW:[id number] DOB: Dec 28, 1935 DOA: 11/11/2023 DOS: the patient was seen and examined on 11/11/2023 PCP: Garald Karlynn LULLA, MD  Patient coming from: Home  Chief Complaint:   Chief Complaint  Patient presents with   Rectal Bleeding   HPI: [name] White is a 88 y.o. male with medical history significant of normocytic anemia, anxiety, atrial fibrillation/flutter on apixaban , status post ablation, chronic diastolic CHF, CAD, depression, type 2 diabetes, lung emphysema,  glaucoma, hepatitis A, measles, mumps, hypertension, hypothyroidism, osteoarthritis, bilateral pleural effusion, severe arctic stenosis status post TAVR, sleep apnea on CPAP, GERD/hiatal hernia, gastritis, duodenal ulcer who presented to the emergency department complaints of dizziness, dyspnea and melena over the past 2 weeks.  He received a blood transfusion last week. He denied fever, chills, rhinorrhea, sore throat, wheezing or hemoptysis.  No chest pain, palpitations, diaphoresis, PND, orthopnea or recent pitting edema of the lower extremities.  No abdominal pain, nausea, emesis, diarrhea or constipation no flank pain, dysuria, frequency or hematuria.  No polyuria, polydipsia, polyphagia or blurred vision.   ED course: Initial vital signs were temperature 97.8 F, pulse 78, respiration 16, blood pressure 98/63 mmHg O2 sat 98% on room air.   Lab work: CBC showed a white count 4.6, hemoglobin 7.7 g/dL and platelets 753.  PT 20.6 and INR 1.7.  Normal lipase and magnesium .  CMP showed a glucose of 210, BUN 28 and creatinine 1.48.  Electrolytes and hepatic function test were normal.    Review of Systems: As mentioned in the history of present illness. All other systems reviewed and are negative. Past Medical History:  Diagnosis Date   Anemia 06/04/2015   Anxiety    Atrial flutter (HCC)    s/p ablation 07/17/08   Cancer (HCC)    leukemia - dx 05/2018   Chronic diastolic CHF  (congestive heart failure) (HCC)    Coronary artery disease    Depression    Diabetes mellitus type 2 in obese 04/10/2009   Qualifier: Diagnosis of  By: Mavis MD, Norleen BRAVO, diet controlled, lost 60lbs   Emphysema of lung (HCC)    GERD (gastroesophageal reflux disease)    Glaucoma    recent dx - has an appt to be evaluated and meds to treat   H/O hiatal hernia    Hearing loss    some hearing loss in left ear but has not been dx, no hearing aids   Hepatitis    h/o of type A   History of measles as a child    History of mumps as a child    Hypertension    Hypothyroidism    Micturition syncope    Osteoarthritis    Pleural effusion, bilateral    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   26 mm Edwards Sapien 3 transcatheter heart valve placed via percutaneous right transfemoral approach    Severe aortic stenosis    Sleep apnea    uses CPAP nightly   Vitamin D  deficiency 11/24/2015   Past Surgical History:  Procedure Laterality Date   CARDIAC CATHETERIZATION  03/18/2019   CARDIAC ELECTROPHYSIOLOGY MAPPING AND ABLATION  06/2008   CARDIAC VALVE REPLACEMENT  04/13/2019   CARDIOVERSION N/A 04/07/2023   Procedure: CARDIOVERSION;  Surgeon: Zenaida Morene PARAS, MD;  Location: [facility] INVASIVE CV LAB;  Service: Cardiovascular;  Laterality: N/A;   CATARACT EXTRACTION W/ INTRAOCULAR LENS  IMPLANT, BILATERAL  ~ 2007   COLONOSCOPY  ESOPHAGOGASTRODUODENOSCOPY N/A 09/05/2023   Procedure: EGD (ESOPHAGOGASTRODUODENOSCOPY);  Surgeon: Abran Norleen SAILOR, MD;  Location: [facility] ENDOSCOPY;  Service: Gastroenterology;  Laterality: N/A;   EYE SURGERY     FOOT TENDON SURGERY Left    INTRAOPERATIVE TRANSTHORACIC ECHOCARDIOGRAM N/A 04/13/2019   Procedure: TRANSTHORACIC ECHOCARDIOGRAM;  Surgeon: Wonda Sharper, MD;  Location: [facility] OR;  Service: Open Heart Surgery;  Laterality: N/A;   JOINT REPLACEMENT  2005 /  2010   RIGHT/LEFT HEART CATH AND CORONARY ANGIOGRAPHY N/A 03/18/2019   Procedure:  RIGHT/LEFT HEART CATH AND CORONARY ANGIOGRAPHY;  Surgeon: Cherrie Toribio SAUNDERS, MD;  Location: [facility] INVASIVE CV LAB;  Service: Cardiovascular;  Laterality: N/A;   RIGHT/LEFT HEART CATH AND CORONARY ANGIOGRAPHY N/A 06/19/2023   Procedure: RIGHT/LEFT HEART CATH AND CORONARY ANGIOGRAPHY;  Surgeon: Wonda Sharper, MD;  Location: [facility] INVASIVE CV LAB;  Service: Cardiovascular;  Laterality: N/A;   TONSILLECTOMY  1940's   TOTAL KNEE ARTHROPLASTY  1990's   right   TOTAL KNEE ARTHROPLASTY Left 08/02/2013   Procedure: LEFT TOTAL KNEE ARTHROPLASTY;  Surgeon: Dempsey LULLA Moan, MD;  Location: [facility] ORS;  Service: Orthopedics;  Laterality: Left;   TRANSCATHETER AORTIC VALVE REPLACEMENT, TRANSFEMORAL N/A 04/13/2019   Procedure: TRANSCATHETER AORTIC VALVE REPLACEMENT, TRANSFEMORAL;  Surgeon: Wonda Sharper, MD;  Location: [facility] OR;  Service: Open Heart Surgery;  Laterality: N/A;   TRANSESOPHAGEAL ECHOCARDIOGRAM (CATH LAB) N/A 04/07/2023   Procedure: TRANSESOPHAGEAL ECHOCARDIOGRAM;  Surgeon: Zenaida Morene PARAS, MD;  Location: [facility] INVASIVE CV LAB;  Service: Cardiovascular;  Laterality: N/A;   UPPER GI ENDOSCOPY     WISDOM TOOTH EXTRACTION     Social History:  reports that he quit smoking about 27 years ago. His smoking use included pipe and cigars. He quit smokeless tobacco use about 14 years ago.  His smokeless tobacco use included chew. He reports that he does not currently use alcohol. He reports that he does not use drugs.  Allergies  Allergen Reactions   Ace Inhibitors Anaphylaxis and Swelling    Face and lip swelling   Benazepril  Anaphylaxis and Swelling    Face and lip swelling   Codeine Itching and Rash   Statins Other (See Comments)    Made very sick, doctor thought had heart attack; my appearance, color, etc. Tolerating a low dose currently   Piroxicam Hives   Pseudoephedrine Other (See Comments)    Can't take because it interacts with dofetilide     Brinzolamide-Brimonidine     Burning sensation, Red  eye   Dasatinib      Hoarsness, weakness, scrotal swelling   Betadine [Povidone Iodine ] Hives and Rash    Family History  Problem Relation Age of Onset   Multiple sclerosis Father    Arthritis Father    Hearing loss Father    Heart disease Mother        atrial fibrillation   Hypertension Son    Heart disease Maternal Grandfather     Prior to Admission medications   Medication Sig Start Date End Date Taking? Authorizing Provider  acetaminophen  (TYLENOL ) 500 MG tablet Take 500 mg by mouth every 6 (six) hours as needed for moderate pain (pain score 4-6).    [provider]  apixaban  (ELIQUIS ) 5 MG TABS tablet Take 5 mg by mouth 2 (two) times daily. 12/09/22   [provider]  carboxymethylcellulose (REFRESH PLUS) 0.5 % SOLN Place 1 drop into both eyes as needed (dry eyes).    [provider]  cholecalciferol (VITAMIN D3) 25 MCG (1000 UNIT) tablet Take 1,000  Units by mouth at bedtime.    [provider]  CINNAMON PO Take 2,000 mg by mouth 2 (two) times daily.     [provider]  Coenzyme Q10 ([employer] COQ-10) 200 MG capsule Take 200 mg by mouth every evening.     [provider]  empagliflozin  (JARDIANCE ) 25 MG TABS tablet Take 25 mg by mouth daily.    [provider]  finasteride  (PROSCAR ) 5 MG tablet Take 5 mg by mouth every evening.    [provider]  furosemide  (LASIX ) 20 MG tablet TAKE 2 TABLETS (40 MG TOTAL) BY MOUTH DAILY. 06/04/23   Bensimhon, Toribio SAUNDERS, MD  latanoprost  (XALATAN ) 0.005 % ophthalmic solution Place 1 drop into both eyes at bedtime.    [provider]  levothyroxine  (SYNTHROID ) 175 MCG tablet Take 175 mcg by mouth daily before breakfast.    [provider]  loratadine  (CLARITIN ) 10 MG tablet Take 10 mg by mouth daily.    [provider]  metFORMIN  (GLUCOPHAGE -XR) 500 MG 24 hr tablet Take 500 mg by mouth 2 (two) times daily.    [provider]  mirabegron  ER (MYRBETRIQ )  50 MG TB24 tablet Take 50 mg by mouth daily. 01/21/23   [provider]  nilotinib  (TASIGNA ) 150 MG capsule Take 1 capsule (150 mg total) by mouth 2 (two) times daily. Take on an empty stomach, 1 hr before or 2 hrs after food. 10/22/23   Plotnikov, Aleksei V, MD  pantoprazole  (PROTONIX ) 40 MG tablet TAKE 1 TABLET (40 MG TOTAL) BY MOUTH TWICE A DAY BEFORE MEALS 11/10/23   [place], PA-C  PARoxetine  (PAXIL ) 40 MG tablet Take 1 tablet (40 mg total) by mouth every morning. 09/14/13   Mavis Norleen BRAVO, MD  rosuvastatin  (CRESTOR ) 10 MG tablet TAKE 1 TABLET (10 MG TOTAL) BY MOUTH DAILY. PLEASE SCHEDULE AN APPOINTMENT FOR FURTHER REFILLS 10/13/23   Zenaida Morene PARAS, MD  spironolactone  (ALDACTONE ) 25 MG tablet Take 25 mg by mouth daily.    [provider]  timolol  (BETIMOL ) 0.5 % ophthalmic solution Place 1 drop into both eyes daily.    [provider]  traZODone  (DESYREL ) 50 MG tablet Take 50 mg by mouth at bedtime.    [provider]  TURMERIC PO Take 1,000 mg by mouth every evening.     [provider]    Physical Exam: Vitals:   11/11/23 0851 11/11/23 0853 11/11/23 1020  BP: 98/63  103/63  Pulse: 78  70  Resp: 16  18  Temp: 97.8 F (36.6 C)  98.8 F (37.1 C)  TempSrc: Oral  Oral  SpO2: 98%  95%  Weight:  98 kg   Height:  6' 2 (1.88 m)    Physical Exam Vitals and nursing note reviewed.  Constitutional:      General: He is awake. He is not in acute distress.    Appearance: He is ill-appearing.  HENT:     Head: Normocephalic.     Nose: No rhinorrhea.     Mouth/Throat:     Mouth: Mucous membranes are moist.  Eyes:     General: No scleral icterus.    Pupils: Pupils are equal, round, and reactive to light.  Neck:     Vascular: No JVD.  Cardiovascular:     Rate and Rhythm: Normal rate and regular rhythm.     Heart sounds: S1 normal and S2 normal.  Pulmonary:     Effort: Pulmonary effort is normal.  Breath sounds: Normal breath  sounds. No wheezing, rhonchi or rales.  Abdominal:     General: Bowel sounds are normal. There is no distension.     Palpations: Abdomen is soft.     Tenderness: There is no abdominal tenderness. There is no right CVA tenderness or left CVA tenderness.  Musculoskeletal:     Cervical back: Neck supple.     Right lower leg: No edema.     Left lower leg: No edema.  Skin:    General: Skin is warm and dry.     Coloration: Skin is pale.  Neurological:     General: No focal deficit present.     Mental Status: He is alert and oriented to person, place, and time.  Psychiatric:        Mood and Affect: Mood normal.        Behavior: Behavior normal. Behavior is cooperative.     Data Reviewed:  Results are pending, will review when available. 03/25/2023 IMPRESSIONS:   1. Left ventricular ejection fraction, by estimation, is 55 to 60%. The  left ventricle has normal function. The left ventricle has no regional  wall motion abnormalities. There is mild left ventricular hypertrophy.  Left ventricular diastolic parameters  are indeterminate.   2. Right ventricular systolic function is mildly reduced. The right  ventricular size is normal. There is severely elevated pulmonary artery  systolic pressure. The estimated right ventricular systolic pressure is  65.2 mmHg.   3. Left atrial size was severely dilated.   4. Right atrial size was severely dilated.   5. Tricuspid valve regurgitation is moderate.   6. The aortic valve has been repaired/replaced. Aortic valve  regurgitation is not visualized. 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 Vmax 2.1 m/s, MG  , EOA 1.1 cm^2, DI 0.48   7. The inferior vena cava is dilated in size with >50% respiratory  variability, suggesting right atrial pressure of 8 mmHg.   8. The mitral valve is abnormal. Moderate to severe mitral valve  regurgitation. Mild mitral stenosis. The mean mitral valve gradient  is 5.0  mmHg with average heart rate of 74 bpm. Eccentric posterior directed MR  jet, difficult to quantify given  eccentric jet, but suspect severe MR. Recommend TEE or cardiac MRI for  further evaluation   EKG: Vent. rate 72 BPM PR interval * ms QRS duration 133 ms QT/QTcB 419/459 ms P-R-T axes * -65 71 Atrial fibrillation Paired ventricular premature complexes Nonspecific IVCD with LAD  Assessment and Plan: Principal Problem:   Melena Associated with:   Acute on chronic blood loss anemia  Admit to telemetry/inpatient. Clear liquid diet Keep NPO for after midnight. Continue IV fluids. Continue pantoprazole  twice daily Transfuse 1 unit of PRBC. Monitor H&H. Transfuse further as needed. GI consult cruciated  Active Problems:   Hypothyroidism  Levothyroxine  175 mcg p.o. daily.    Hyperlipidemia Continue rosuvastatin  10 mg p.o. daily.    Depression with anxiety Continue paroxetine  30 mg p.o. daily. Continue trazodone  75 mg p.o. bedtime.    Essential hypertension Hold diuretics and antihypertensives.    GERD On PPI as above.    Obstructive sleep apnea CPAP at bedtime.    Chronic atrial fibrillation (HCC) Hold anticoagulation.    Coronary atherosclerosis of native coronary artery Hold apixaban , amlodipine  and diuretics.    Primary open-angle glaucoma, bilateral, mild stage Continue timolol  and Xalatan  drops.    Type 2 diabetes mellitus with  hyperglycemia (HCC) Carbohydrate modified diet. Continue metformin  500 mg p.o. twice daily. Continue Jardiance  10 mg p.o. daily. CBG monitoring with RI SS. Check hemoglobin A1c.      Advance Care Planning:   Code Status: Full Code   Consults: [place] gastro[facility].  Family Communication:   Severity of Illness: The appropriate patient status for this patient is INPATIENT. Inpatient status is judged to be reasonable and necessary in order to provide the required intensity of service to ensure the patient's  safety. The patient's presenting symptoms, physical exam findings, and initial radiographic and laboratory data in the context of their chronic comorbidities is felt to place them at high risk for further clinical deterioration. Furthermore, it is not anticipated that the patient will be medically stable for discharge from the hospital within 2 midnights of admission.   * I certify that at the point of admission it is my clinical judgment that the patient will require inpatient hospital care spanning beyond 2 midnights from the point of admission due to high intensity of service, high risk for further deterioration and high frequency of surveillance required.*  Author: Alm Dorn Castor, MD 11/11/2023 11:48 AM  For on call review [email or web address].   This document was prepared using Dragon voice recognition software and may contain some unintended transcription errors.

## 2023-11-11 NOTE — Consult Note (Addendum)
 Referring Provider: Dr. Bernardino Fireman  Primary Care Physician:  [name], MD Primary Gastroenterologist:  Dr. Norleen Kiang   Reason for Consultation:  Anemia, melena   HPI: [name] White is a 88 y.o. male with a past medical history of anxiety, depression, hypertension, nonobstructive coronary artery disease, severe AS s/p TAVR 04/2019, MR, atrial flutter s/p ablation 2010, chronic diastolic CHF with LV EF 50 - 55%, diabetes mellitus type 2, OSA, lymphocytic leukemia, remote history of colon polyps in the 1990s, GERD, large hiatal hernia, gastritis and a duodenal ulcer.  Patient presented to the ED with persistent melena and anemia.  Labs in the ED showed a Hg level of 7.7. Hg 6.5 on 8/6, received 1 unit of PRBCs per oncology on 8/8 and 8/9 as an outpatient. HCT 26.9.  Platelets 246.  INR 1.7.  BUN 28.  Creatinine 1.48.  Total bili 0.9.  Alk phos 70.  AST 21.  ALT 13.  Lipase 35. GI consult was requested for further evaluation regarding suspected upper GI bleed with acute on chronic anemia.  He denies having any nausea or vomiting.  No heartburn or dysphagia.  He denies taking acid reducing medication at home, however, his wife verified that he has been taking Pantoprazole  40mg  once daily and a refill is ready for pick up at their pharmacy. No upper or lower abdominal pain.  He endorses passing dark stools for the past year and specifically noted passing black stools x 2 days prior to arriving to the ED.  No BM or melena today.  No bright red blood per the rectum.  He takes Eliquis  twice daily, last dose was today at 6 AM.  No NSAIDs.  He was previously seen in our outpatient GI clinic 09/02/2023 for further evaluation regarding IDA.  Stool was heme positive and his hemoglobin level 7.2 therefore he was sent to the ED for further evaluation. He received a blood transfusion and he was hemodynamically stable therefore he was discharged home. He underwent an outpatient EGD at [facility]  09/05/2023 which identified nonerosive esophagitis, a large hiatal hernia without erosions, superficial antral erosions and a duodenal bulb ulcer without stigmata of recent bleeding. Path report was negative for H. pylori.  He was discharged home with instructions to avoid NSAIDs and to take Pantoprazole  40 mg indefinitely.  He has a history of chronic iron deficiency anemia followed by hematologist Dr. Federico. He received IV iron x 5 infusions 3 months ago. Stopped taking oral iron  6 to 12 months ago.  He also has CML which was stable at the time of his last office visit with Dr. Federico 10/01/2023.  ECHO 04/07/2023: Left ventricular ejection fraction, by estimation, is 50 to 55%. The left ventricle has low normal function. 1. RVSP mildly elevated but likely underestimates true PA pressures.. Right ventricular systolic function is normal. The right ventricular size is normal. There is mildly elevated pulmonary artery systolic pressure. 2. Left atrial size was moderately dilated. No left atrial/left atrial appendage thrombus was detected. 3. 4. Right atrial size was mildly dilated. Severe mitral regurgitation with reversal of flow in the pulmonary veins. MVA 6.1 cm2. EROA 0.75cm2, MR volume . Severely restricted posterior leaflet measured at around 1cm. Will refer for assessment of mTEER.. The mitral valve is abnormal. Severe mitral valve regurgitation. No evidence of mitral stenosis. The mean mitral valve gradient is 3.0 mmHg. 5. 6. Tricuspid valve regurgitation is mild to moderate. 7. The aortic valve has been  repaired/replaced. Aortic valve regurgitation is not  Cardiac catheterization 06/19/2023: 1.  Patent coronary arteries with nonobstructive CAD as detailed below 2.  Normal TAVR function with peak to peak gradient of 9 mmHg 3.  Large V waves of 34 mmHg suggestive of hemodynamically significant mitral regurgitation 4.  Mean pulmonary artery pressure 31 mmHg, transpulmonary gradient 10 mmHg, PVR 1.37  Wood units.   Recommendations: Valve team review for consideration of transcatheter edge-to-edge repair of the mitral valve  MOST RECENT GI PROCEDURES:  EGD 09/05/2023 secondary to IDA. 1. Nonerosive esophagitis  2. Moderately large hiatal hernia without erosions  3. Superficial antral erosions 4. Duodenal bulb ulcer without stigmata. Status post biopsies of the gastric antrum to rule out  H.pylori. A. STOMACH, ANTRUM, BIOPSY:  - Antral mucosa with reactive gastritis and features consistent with  healing mucosal injury.  - Negative for H. pylori, intestinal metaplasia, dysplasia, and  malignancy.   EGD 12/05/2015: Scattered polyps throughout the stomach, likely fundic gland polyp 1 cm hiatal hernia   Colonoscopy 12/05/2015: Mild diverticulosis found in the sigmoid colon No polyps  Colonoscopy 03/09/2009 Diverticulosis, no polyps  EGD 05/05/2006: GERD with esophagitis   Past Medical History:  Diagnosis Date   Anemia 06/04/2015   Anxiety    Atrial flutter (HCC)    s/p ablation 07/17/08   Cancer (HCC)    leukemia - dx 05/2018   Chronic diastolic CHF (congestive heart failure) (HCC)    Coronary artery disease    Depression    Diabetes mellitus type 2 in obese 04/10/2009   Qualifier: Diagnosis of  By: Mavis MD, Norleen BRAVO, diet controlled, lost 60lbs   Emphysema of lung (HCC)    GERD (gastroesophageal reflux disease)    Glaucoma    recent dx - has an appt to be evaluated and meds to treat   H/O hiatal hernia    Hearing loss    some hearing loss in left ear but has not been dx, no hearing aids   Hepatitis    h/o of type A   History of measles as a child    History of mumps as a child    Hypertension    Hypothyroidism    Micturition syncope    Osteoarthritis    Pleural effusion, bilateral    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   26 mm Edwards Sapien 3 transcatheter heart valve placed via percutaneous right transfemoral approach    Severe aortic  stenosis    Sleep apnea    uses CPAP nightly   Vitamin D  deficiency 11/24/2015    Past Surgical History:  Procedure Laterality Date   CARDIAC CATHETERIZATION  03/18/2019   CARDIAC ELECTROPHYSIOLOGY MAPPING AND ABLATION  06/2008   CARDIAC VALVE REPLACEMENT  04/13/2019   CARDIOVERSION N/A 04/07/2023   Procedure: CARDIOVERSION;  Surgeon: Zenaida Morene PARAS, MD;  Location: [facility] INVASIVE CV LAB;  Service: Cardiovascular;  Laterality: N/A;   CATARACT EXTRACTION W/ INTRAOCULAR LENS  IMPLANT, BILATERAL  ~ 2007   COLONOSCOPY     ESOPHAGOGASTRODUODENOSCOPY N/A 09/05/2023   Procedure: EGD (ESOPHAGOGASTRODUODENOSCOPY);  Surgeon: Abran Norleen SAILOR, MD;  Location: [facility] ENDOSCOPY;  Service: Gastroenterology;  Laterality: N/A;   EYE SURGERY     FOOT TENDON SURGERY Left    INTRAOPERATIVE TRANSTHORACIC ECHOCARDIOGRAM N/A 04/13/2019   Procedure: TRANSTHORACIC ECHOCARDIOGRAM;  Surgeon: Wonda Sharper, MD;  Location: [facility] OR;  Service: Open Heart Surgery;  Laterality: N/A;   JOINT REPLACEMENT  2005 /  2010   RIGHT/LEFT HEART CATH  AND CORONARY ANGIOGRAPHY N/A 03/18/2019   Procedure: RIGHT/LEFT HEART CATH AND CORONARY ANGIOGRAPHY;  Surgeon: Cherrie Toribio SAUNDERS, MD;  Location: [facility] INVASIVE CV LAB;  Service: Cardiovascular;  Laterality: N/A;   RIGHT/LEFT HEART CATH AND CORONARY ANGIOGRAPHY N/A 06/19/2023   Procedure: RIGHT/LEFT HEART CATH AND CORONARY ANGIOGRAPHY;  Surgeon: Wonda Sharper, MD;  Location: [facility] INVASIVE CV LAB;  Service: Cardiovascular;  Laterality: N/A;   TONSILLECTOMY  1940's   TOTAL KNEE ARTHROPLASTY  1990's   right   TOTAL KNEE ARTHROPLASTY Left 08/02/2013   Procedure: LEFT TOTAL KNEE ARTHROPLASTY;  Surgeon: Dempsey LULLA Moan, MD;  Location: [facility] ORS;  Service: Orthopedics;  Laterality: Left;   TRANSCATHETER AORTIC VALVE REPLACEMENT, TRANSFEMORAL N/A 04/13/2019   Procedure: TRANSCATHETER AORTIC VALVE REPLACEMENT, TRANSFEMORAL;  Surgeon: Wonda Sharper, MD;  Location: [facility] OR;  Service: Open Heart Surgery;   Laterality: N/A;   TRANSESOPHAGEAL ECHOCARDIOGRAM (CATH LAB) N/A 04/07/2023   Procedure: TRANSESOPHAGEAL ECHOCARDIOGRAM;  Surgeon: Zenaida Morene PARAS, MD;  Location: [facility] INVASIVE CV LAB;  Service: Cardiovascular;  Laterality: N/A;   UPPER GI ENDOSCOPY     WISDOM TOOTH EXTRACTION      Prior to Admission medications   Medication Sig Start Date End Date Taking? Authorizing Provider  acetaminophen  (TYLENOL ) 500 MG tablet Take 500 mg by mouth every 6 (six) hours as needed for moderate pain (pain score 4-6).    [provider]  apixaban  (ELIQUIS ) 5 MG TABS tablet Take 5 mg by mouth 2 (two) times daily. 12/09/22   [provider]  carboxymethylcellulose (REFRESH PLUS) 0.5 % SOLN Place 1 drop into both eyes as needed (dry eyes).    [provider]  cholecalciferol (VITAMIN D3) 25 MCG (1000 UNIT) tablet Take 1,000 Units by mouth at bedtime.    [provider]  CINNAMON PO Take 2,000 mg by mouth 2 (two) times daily.     [provider]  Coenzyme Q10 ([employer] COQ-10) 200 MG capsule Take 200 mg by mouth every evening.     [provider]  empagliflozin  (JARDIANCE ) 25 MG TABS tablet Take 25 mg by mouth daily.    [provider]  finasteride  (PROSCAR ) 5 MG tablet Take 5 mg by mouth every evening.    [provider]  furosemide  (LASIX ) 20 MG tablet TAKE 2 TABLETS (40 MG TOTAL) BY MOUTH DAILY. 06/04/23   Bensimhon, Toribio SAUNDERS, MD  latanoprost  (XALATAN ) 0.005 % ophthalmic solution Place 1 drop into both eyes at bedtime.    [provider]  levothyroxine  (SYNTHROID ) 175 MCG tablet Take 175 mcg by mouth daily before breakfast.    [provider]  loratadine  (CLARITIN ) 10 MG tablet Take 10 mg by mouth daily.    [provider]  metFORMIN  (GLUCOPHAGE -XR) 500 MG 24 hr tablet Take 500 mg by mouth 2 (two) times daily.    [provider]  mirabegron  ER (MYRBETRIQ ) 50 MG TB24 tablet Take 50 mg by mouth daily. 01/21/23    [provider]  nilotinib  (TASIGNA ) 150 MG capsule Take 1 capsule (150 mg total) by mouth 2 (two) times daily. Take on an empty stomach, 1 hr before or 2 hrs after food. 10/22/23   Plotnikov, Aleksei V, MD  pantoprazole  (PROTONIX ) 40 MG tablet TAKE 1 TABLET (40 MG TOTAL) BY MOUTH TWICE A DAY BEFORE MEALS 11/10/23   [place], PA-C  PARoxetine  (PAXIL ) 40 MG tablet Take 1 tablet (40 mg total) by mouth every morning. 09/14/13   Jenkins, John E, MD  rosuvastatin  (CRESTOR )  10 MG tablet TAKE 1 TABLET (10 MG TOTAL) BY MOUTH DAILY. PLEASE SCHEDULE AN APPOINTMENT FOR FURTHER REFILLS 10/13/23   Zenaida Morene PARAS, MD  spironolactone  (ALDACTONE ) 25 MG tablet Take 25 mg by mouth daily.    [provider]  timolol  (BETIMOL ) 0.5 % ophthalmic solution Place 1 drop into both eyes daily.    [provider]  traZODone  (DESYREL ) 50 MG tablet Take 50 mg by mouth at bedtime.    [provider]  TURMERIC PO Take 1,000 mg by mouth every evening.     [provider]    Current Facility-Administered Medications  Medication Dose Route Frequency Provider Last Rate Last Admin   pantoprazole  (PROTONIX ) injection 40 mg  40 mg Intravenous Q12H Melvenia Motto, MD       Current Outpatient Medications  Medication Sig Dispense Refill   acetaminophen  (TYLENOL ) 500 MG tablet Take 500 mg by mouth every 6 (six) hours as needed for moderate pain (pain score 4-6).     apixaban  (ELIQUIS ) 5 MG TABS tablet Take 5 mg by mouth 2 (two) times daily.     carboxymethylcellulose (REFRESH PLUS) 0.5 % SOLN Place 1 drop into both eyes as needed (dry eyes).     cholecalciferol (VITAMIN D3) 25 MCG (1000 UNIT) tablet Take 1,000 Units by mouth at bedtime.     CINNAMON PO Take 2,000 mg by mouth 2 (two) times daily.      Coenzyme Q10 (CVS COQ-10) 200 MG capsule Take 200 mg by mouth every evening.      empagliflozin  (JARDIANCE ) 25 MG TABS tablet Take 25 mg by mouth daily.     finasteride  (PROSCAR ) 5 MG tablet  Take 5 mg by mouth every evening.     furosemide  (LASIX ) 20 MG tablet TAKE 2 TABLETS (40 MG TOTAL) BY MOUTH DAILY. 180 tablet 1   latanoprost  (XALATAN ) 0.005 % ophthalmic solution Place 1 drop into both eyes at bedtime.     levothyroxine  (SYNTHROID ) 175 MCG tablet Take 175 mcg by mouth daily before breakfast.     loratadine  (CLARITIN ) 10 MG tablet Take 10 mg by mouth daily.     metFORMIN  (GLUCOPHAGE -XR) 500 MG 24 hr tablet Take 500 mg by mouth 2 (two) times daily.     mirabegron  ER (MYRBETRIQ ) 50 MG TB24 tablet Take 50 mg by mouth daily.     nilotinib  (TASIGNA ) 150 MG capsule Take 1 capsule (150 mg total) by mouth 2 (two) times daily. Take on an empty stomach, 1 hr before or 2 hrs after food. 60 capsule 1   pantoprazole  (PROTONIX ) 40 MG tablet TAKE 1 TABLET (40 MG TOTAL) BY MOUTH TWICE A DAY BEFORE MEALS 180 tablet 0   PARoxetine  (PAXIL ) 40 MG tablet Take 1 tablet (40 mg total) by mouth every morning. 90 tablet 1   rosuvastatin  (CRESTOR ) 10 MG tablet TAKE 1 TABLET (10 MG TOTAL) BY MOUTH DAILY. PLEASE SCHEDULE AN APPOINTMENT FOR FURTHER REFILLS 90 tablet 3   spironolactone  (ALDACTONE ) 25 MG tablet Take 25 mg by mouth daily.     timolol  (BETIMOL ) 0.5 % ophthalmic solution Place 1 drop into both eyes daily.     traZODone  (DESYREL ) 50 MG tablet Take 50 mg by mouth at bedtime.     TURMERIC PO Take 1,000 mg by mouth every evening.       Allergies as of 11/11/2023 - Review Complete 11/11/2023  Allergen Reaction Noted   Ace inhibitors Anaphylaxis and Swelling 08/26/2013   Benazepril  Anaphylaxis and Swelling 02/01/2014  Codeine Itching and Rash    Statins Other (See Comments)    Piroxicam Hives 07/19/2013   Pseudoephedrine Other (See Comments) 07/19/2013   Brinzolamide-brimonidine  10/01/2021   Dasatinib   04/20/2019   Betadine [povidone iodine ] Hives and Rash 04/15/2019    Family History  Problem Relation Age of Onset   Multiple sclerosis Father    Arthritis Father    Hearing loss Father     Heart disease Mother        atrial fibrillation   Hypertension Son    Heart disease Maternal Grandfather     Social History   Socioeconomic History   Marital status: Married    Spouse name: [name]   Number of children: 2   Years of education: Not on file   Highest education level: Not on file  Occupational History   Occupation: Retired  Tobacco Use   Smoking status: Former    Types: Pipe, Cigars    Quit date: 04/01/1996    Years since quitting: 27.6   Smokeless tobacco: Former    Types: Chew    Quit date: 04/01/2009  Vaping Use   Vaping status: Never Used  Substance and Sexual Activity   Alcohol use: Not Currently    Alcohol/week: 0.0 standard drinks of alcohol    Comment: Quit wine early 2020.   Drug use: No   Sexual activity: Not Currently    Comment: lives with wife, eats clean diet. No major dietary restrtictions,   Other Topics Concern   Not on file  Social History Narrative   Live in home with wife.   Social Drivers of Corporate investment banker Strain: Low Risk  (02/20/2023)   Overall Financial Resource Strain (CARDIA)    Difficulty of Paying Living Expenses: Not hard at all  Food Insecurity: No Food Insecurity (10/01/2023)   Hunger Vital Sign    Worried About Running Out of Food in the Last Year: Never true    Ran Out of Food in the Last Year: Never true  Transportation Needs: No Transportation Needs (10/01/2023)   PRAPARE - Administrator, Civil Service (Medical): No    Lack of Transportation (Non-Medical): No  Physical Activity: Insufficiently Active (02/20/2023)   Exercise Vital Sign    Days of Exercise per Week: 7 days    Minutes of Exercise per Session: 20 min  Stress: No Stress Concern Present (02/20/2023)   [employer] of Occupational Health - Occupational Stress Questionnaire    Feeling of Stress : Not at all  Social Connections: Moderately Isolated (02/20/2023)   Social Connection and Isolation Panel    Frequency of  Communication with Friends and Family: Three times a week    Frequency of Social Gatherings with Friends and Family: Once a week    Attends Religious Services: Never    Database administrator or Organizations: No    Attends Banker Meetings: Never    Marital Status: Married  Catering manager Violence: Not At Risk (10/01/2023)   Humiliation, Afraid, Rape, and Kick questionnaire    Fear of Current or Ex-Partner: No    Emotionally Abused: No    Physically Abused: No    Sexually Abused: No   Review of Systems: Gen: Denies fever, sweats or chills. No weight loss.  CV: Denies chest pain, palpitations or edema. Resp: Denies cough, shortness of breath of hemoptysis.  GI: See HPI.  GU : Denies urinary burning, blood in urine, increased urinary frequency or incontinence. MS:  Denies joint pain, muscles aches or weakness. Derm: Denies rash, itchiness, skin lesions or unhealing ulcers. Psych: Denies depression, anxiety, memory loss or confusion. Heme: Denies easy bruising, bleeding. Neuro:  Denies headaches, dizziness or paresthesias. Endo:  Denies any problems with DM, thyroid  or adrenal function.  Physical Exam: Vital signs in last 24 hours: Temp:  [97.8 F (36.6 C)-98.8 F (37.1 C)] 98.8 F (37.1 C) (08/12 1020) Pulse Rate:  [70-78] 70 (08/12 1020) Resp:  [16-18] 18 (08/12 1020) BP: (98-103)/(63) 103/63 (08/12 1020) SpO2:  [95 %-98 %] 95 % (08/12 1020) Weight:  [98 kg] 98 kg (08/12 0853)   General: Alert 88 year old male in no acute distress. Head:  Normocephalic and atraumatic. Eyes:  No scleral icterus. Conjunctiva pink. Ears:  Normal auditory acuity. Nose:  No deformity, discharge or lesions. Mouth: Dentition intact. No ulcers or lesions.  Neck:  Supple. No lymphadenopathy or thyromegaly.  Lungs: Breath sounds clear throughout. No wheezes, rhonchi or crackles.  Heart: Regular rhythm, 2/6 systolic murmur. Abdomen: Soft, nondistended.  Nontender.  Bowel sounds to all  4 quadrants.  No bruit.  No palpable mass. Rectal: Deferred. Rectal exam per ED physician was negative for melena or blood in the rectum.  FOBT reported as negative. Musculoskeletal:  Symmetrical without gross deformities.  Pulses:  Normal pulses noted. Extremities:  Without clubbing or edema. Neurologic:  Alert and oriented x 4. No focal deficits.  Skin:  Intact without significant lesions or rashes. Psych:  Alert and cooperative. Normal mood and affect.  Intake/Output from previous day: No intake/output data recorded. Intake/Output this shift: No intake/output data recorded.  Lab Results: Recent Labs    11/11/23 0942  WBC 4.6  HGB 7.7*  HCT 26.9*  PLT 246   BMET Recent Labs    11/11/23 0942  NA 136  K 4.3  CL 100  CO2 25  GLUCOSE 210*  BUN 28*  CREATININE 1.48*  CALCIUM  9.2   LFT Recent Labs    11/11/23 0942  PROT 7.4  ALBUMIN 3.5  AST 21  ALT 13  ALKPHOS 70  BILITOT 0.9   PT/INR Recent Labs    11/11/23 0942  LABPROT 20.6*  INR 1.7*   Hepatitis Panel No results for input(s): HEPBSAG, HCVAB, HEPAIGM, HEPBIGM in the last 72 hours.    Studies/Results: No results found.  IMPRESSION/PLAN:  88 year old male with acute on chronic IDA with chronically dark stools and melena x 2 days. Suspect, UGI bleed. Hg 7.7 ( Hg 6.5 on 8/6, transfused 2 units of PRBCs 8/8 - 8/9 per oncology as an outpatient). One unit of PRBCS ordered, not yet transfused. No melena today. Hemodynamically stable.  - Clear liquid diet - NPO after midnight - EGD benefits and risks discussed including risk with sedation, risk of bleeding, perforation and infection, timing to be determined. Await further recommendations per Dr. Stacia. - Check H/H post transfusion then Q 6 hrs x 24 hours - Transfuse for Hg < 8 - Continue to hold Eliquis    GERD, duodenal ulcer and hiatal hernia. EGD 09/05/2023 showed nonerosive esophagitis, antral erosions, duodenal bulb ulcer without stigmata  of recent bleeding.  Patient was prescribed Pantoprazole  40 mg daily indefinitely following his EGD 08/2023.  -See plan above   Nonobstructive CAD, no angina  AS s/p TAVR 2021. Severe MR, given minimal symptoms and calcified leaflet cardiology holding off on mitral valve repair   Atrial flutter s/p ablation on Eliquis , last  dose 6am -Continue to hold Eliquis   -Consider  Watchman's procedure, follow-up with cardiology as an outpatient  Chronic diastolic CHF  CML - Follow-up with oncology as an outpatient  Renal insufficiency. Cr 1.48   Elida HERO Kennedy-Smith  11/11/2023, 12:53 PM

## 2023-11-11 NOTE — ED Provider Notes (Signed)
 [place] EMERGENCY DEPARTMENT AT [facility] Provider Note   CSN: [id number] Arrival date & time: 11/11/23  0845     Patient presents with: Rectal Bleeding   [name] White is a 88 y.o. male.   HPI Patient presents for melena.  Medical history includes atrial fibrillation/flutter, aortic stenosis, DM, CHF, CAD, depression, anxiety, GERD, anemia, HTN, arthritis, sleep apnea.   He is followed by [facility] (Dr. Abran).  He has a history of bleeding ulcers.  Last upper endoscopy was in June.  This showed distal esophagitis, moderate hiatal hernia, multiple superficial nonbleeding antral erosions, edematous linear ulcer at the bulb D2 junction.  He is on Eliquis .  For the past 2 weeks, he has had dark stools.  His baseline hemoglobin is in the 9-10 range.  He had outpatient lab work 6 days ago which showed hemoglobin of 6.5.  He was symptomatic with lightheadedness and exertional shortness of breath at the time.  He received 1 unit of PRBCs on Friday and another unit on Saturday.  He has had continued melena.  He reached out to his GI office, who advised him to come to the ED.  His last dose of Eliquis  was this morning.  He did have a small breakfast.  Denies any associated abdominal pain.    Prior to Admission medications   Medication Sig Start Date End Date Taking? Authorizing Provider  acetaminophen  (TYLENOL ) 500 MG tablet Take 500 mg by mouth every 6 (six) hours as needed for moderate pain (pain score 4-6).    [provider]  apixaban  (ELIQUIS ) 5 MG TABS tablet Take 5 mg by mouth 2 (two) times daily. 12/09/22   [provider]  carboxymethylcellulose (REFRESH PLUS) 0.5 % SOLN Place 1 drop into both eyes as needed (dry eyes).    [provider]  cholecalciferol (VITAMIN D3) 25 MCG (1000 UNIT) tablet Take 1,000 Units by mouth at bedtime.    [provider]  CINNAMON PO Take 2,000 mg by mouth 2 (two) times daily.     [provider]   Coenzyme Q10 ([employer] COQ-10) 200 MG capsule Take 200 mg by mouth every evening.     [provider]  empagliflozin  (JARDIANCE ) 25 MG TABS tablet Take 25 mg by mouth daily.    [provider]  finasteride  (PROSCAR ) 5 MG tablet Take 5 mg by mouth every evening.    [provider]  furosemide  (LASIX ) 20 MG tablet TAKE 2 TABLETS (40 MG TOTAL) BY MOUTH DAILY. 06/04/23   Bensimhon, Toribio SAUNDERS, MD  latanoprost  (XALATAN ) 0.005 % ophthalmic solution Place 1 drop into both eyes at bedtime.    [provider]  levothyroxine  (SYNTHROID ) 175 MCG tablet Take 175 mcg by mouth daily before breakfast.    [provider]  loratadine  (CLARITIN ) 10 MG tablet Take 10 mg by mouth daily.    [provider]  metFORMIN  (GLUCOPHAGE -XR) 500 MG 24 hr tablet Take 500 mg by mouth 2 (two) times daily.    [provider]  mirabegron  ER (MYRBETRIQ ) 50 MG TB24 tablet Take 50 mg by mouth daily. 01/21/23   [provider]  nilotinib  (TASIGNA ) 150 MG capsule Take 1 capsule (150 mg total) by mouth 2 (two) times daily. Take on an empty stomach, 1 hr before or 2 hrs after food. 10/22/23   Plotnikov, Aleksei V, MD  pantoprazole  (PROTONIX ) 40 MG tablet TAKE 1 TABLET (40 MG TOTAL) BY MOUTH TWICE A DAY BEFORE MEALS 11/10/23  [place], PA-C  PARoxetine  (PAXIL ) 40 MG tablet Take 1 tablet (40 mg total) by mouth every morning. 09/14/13   Mavis Norleen BRAVO, MD  rosuvastatin  (CRESTOR ) 10 MG tablet TAKE 1 TABLET (10 MG TOTAL) BY MOUTH DAILY. PLEASE SCHEDULE AN APPOINTMENT FOR FURTHER REFILLS 10/13/23   Zenaida Morene PARAS, MD  spironolactone  (ALDACTONE ) 25 MG tablet Take 25 mg by mouth daily.    [provider]  timolol  (BETIMOL ) 0.5 % ophthalmic solution Place 1 drop into both eyes daily.    [provider]  traZODone  (DESYREL ) 50 MG tablet Take 50 mg by mouth at bedtime.    [provider]  TURMERIC PO Take 1,000 mg by mouth every evening.     [provider]    Allergies: Ace inhibitors, Benazepril , Codeine, Statins, Piroxicam, Pseudoephedrine, Brinzolamide-brimonidine, Dasatinib , and Betadine [povidone iodine ]    Review of Systems  Gastrointestinal:  Positive for blood in stool.  Neurological:  Positive for weakness (Generalized) and light-headedness.  All other systems reviewed and are negative.   Updated Vital Signs BP 103/63 (BP Location: Right Arm)   Pulse 70   Temp 98.8 F (37.1 C) (Oral)   Resp 18   Ht 6' 2 (1.88 m)   Wt 98 kg   SpO2 95%   BMI 27.74 kg/m   Physical Exam Vitals and nursing note reviewed.  Constitutional:      General: He is not in acute distress.    Appearance: Normal appearance. He is well-developed. He is not ill-appearing, toxic-appearing or diaphoretic.  HENT:     Head: Normocephalic and atraumatic.     Right Ear: External ear normal.     Left Ear: External ear normal.     Nose: Nose normal.     Mouth/Throat:     Mouth: Mucous membranes are moist.  Eyes:     Extraocular Movements: Extraocular movements intact.     Conjunctiva/sclera: Conjunctivae normal.  Cardiovascular:     Rate and Rhythm: Normal rate and regular rhythm.  Pulmonary:     Effort: Pulmonary effort is normal. No respiratory distress.  Abdominal:     General: There is no distension.     Palpations: Abdomen is soft.     Tenderness: There is no abdominal tenderness.  Genitourinary:    Comments: No stool present on glove during DRE. Musculoskeletal:        General: No swelling. Normal range of motion.     Cervical back: Normal range of motion and neck supple.     Right lower leg: No edema.     Left lower leg: No edema.  Skin:    General: Skin is warm and dry.     Coloration: Skin is not jaundiced or pale.  Neurological:     General: No focal deficit present.     Mental Status: He is alert and oriented to person, place, and time.  Psychiatric:        Mood and Affect: Mood normal.        Behavior: Behavior  normal.     (all labs ordered are listed, but only abnormal results are displayed) Labs Reviewed  COMPREHENSIVE METABOLIC PANEL WITH GFR - Abnormal; Notable for the following components:      Result Value   Glucose, Bld 210 (*)    BUN 28 (*)    Creatinine, Ser 1.48 (*)    GFR, Estimated 46 (*)    All other components within normal limits  CBC WITH DIFFERENTIAL/PLATELET - Abnormal;  Notable for the following components:   RBC 3.40 (*)    Hemoglobin 7.7 (*)    HCT 26.9 (*)    MCV 79.1 (*)    MCH 22.6 (*)    MCHC 28.6 (*)    RDW 19.7 (*)    All other components within normal limits  PROTIME-INR - Abnormal; Notable for the following components:   Prothrombin Time 20.6 (*)    INR 1.7 (*)    All other components within normal limits  LIPASE, BLOOD  MAGNESIUM   POC OCCULT BLOOD, ED  TYPE AND SCREEN  PREPARE RBC (CROSSMATCH)    EKG: EKG Interpretation Date/Time:  Tuesday November 11 2023 09:15:17 EDT Ventricular Rate:  72 PR Interval:    QRS Duration:  133 QT Interval:  419 QTC Calculation: 459 R Axis:   -65  Text Interpretation: Atrial fibrillation Paired ventricular premature complexes Nonspecific IVCD with LAD Confirmed by Melvenia Motto ([id number]) on 11/11/2023 10:47:27 AM  Radiology: No results found.   Procedures   Medications Ordered in the ED  pantoprazole  (PROTONIX ) injection 40 mg (40 mg Intravenous Given 11/11/23 1148)  acetaminophen  (TYLENOL ) tablet 650 mg (has no administration in time range)    Or  acetaminophen  (TYLENOL ) suppository 650 mg (has no administration in time range)  ondansetron  (ZOFRAN ) tablet 4 mg (has no administration in time range)    Or  ondansetron  (ZOFRAN ) injection 4 mg (has no administration in time range)  0.9 %  sodium chloride  infusion (Manually program via Guardrails IV Fluids) (has no administration in time range)                                    Medical Decision Making Amount and/or Complexity of Data Reviewed Labs:  ordered.  Risk Prescription drug management. Decision regarding hospitalization.   This patient presents to the ED for concern of melena, this involves an extensive number of treatment options, and is a complaint that carries with it a high risk of complications and morbidity.  The differential diagnosis includes bleeding ulcer, AVM, diverticular bleed, blood loss anemia   Co morbidities / Chronic conditions that complicate the patient evaluation  atrial fibrillation/flutter, aortic stenosis, DM, CHF, CAD, depression, anxiety, GERD, anemia, HTN, arthritis, sleep apnea   Additional history obtained:  Additional history obtained from EMR External records from outside source obtained and reviewed including patient's family   Lab Tests:  I Ordered, and personally interpreted labs.  The pertinent results include: Hemoglobin of 7.7 indicative of ongoing blood loss, no leukocytosis, baseline creatinine, normal electrolytes  Cardiac Monitoring: / EKG:  The patient was maintained on a cardiac monitor.  I personally viewed and interpreted the cardiac monitored which showed an underlying rhythm of: Sinus rhythm   Problem List / ED Course / Critical interventions / Medication management  Patient presents for ongoing melena over the past 2 weeks.  This did lead to symptomatic anemia and he did receive 2 blood transfusions over the past week.  This has improved his lightheadedness and generalized weakness.  He has an ongoing melena and was advised to come to the ED by his GI doctor.  On arrival, he is well-appearing.  Vital signs notable for slight blood pressures in the range of 90s over 60s.  He is overall well-appearing on exam.  Abdomen is soft and nontender.  DRE was performed with nurse chaperone present.  No stool was present on glove.  Hemoccult  testing was likely falsely negative.  Lab work today shows hemoglobin of 7.7.  This does suggest ongoing blood loss given his 2 transfusions only a  couple days ago.  Protonix  was ordered.  I spoke with gastroenterologists on-call, Dr. Charlanne and PA [facility], who will see the patient in consult.  Patient was admitted for further management. I ordered medication including Protonix  for upper GI bleed Reevaluation of the patient after these medicines showed that the patient stayed the same I have reviewed the patients home medicines and have made adjustments as needed   Consultations Obtained:  I requested consultation with the gastroenterologist, Dr. Charlanne,  and discussed lab and imaging findings as well as pertinent plan - they recommend: Will see in consult   Social Determinants of Health:  Has access to outpatient care      Final diagnoses:  Melena    ED Discharge Orders     None          Melvenia Motto, MD 11/11/23 1237

## 2023-11-11 NOTE — ED Notes (Signed)
 Blood bank said they have blood ready for this patient.  Notified Sharlett Baseman, EMT-P.

## 2023-11-12 ENCOUNTER — Observation Stay (HOSPITAL_COMMUNITY): Admitting: Certified Registered Nurse Anesthetist

## 2023-11-12 ENCOUNTER — Encounter (HOSPITAL_COMMUNITY)
Admission: EM | Disposition: A | Discharge status: Home / Self Care | Payer: Self-pay | Source: Ambulatory Visit | Attending: Internal Medicine

## 2023-11-12 DIAGNOSIS — Z96652 Presence of left artificial knee joint: Secondary | ICD-10-CM | POA: Diagnosis present

## 2023-11-12 DIAGNOSIS — E1165 Type 2 diabetes mellitus with hyperglycemia: Secondary | ICD-10-CM | POA: Diagnosis present

## 2023-11-12 DIAGNOSIS — D62 Acute posthemorrhagic anemia: Secondary | ICD-10-CM | POA: Diagnosis present

## 2023-11-12 DIAGNOSIS — D500 Iron deficiency anemia secondary to blood loss (chronic): Secondary | ICD-10-CM | POA: Diagnosis not present

## 2023-11-12 DIAGNOSIS — E1122 Type 2 diabetes mellitus with diabetic chronic kidney disease: Secondary | ICD-10-CM | POA: Diagnosis present

## 2023-11-12 DIAGNOSIS — K2289 Other specified disease of esophagus: Secondary | ICD-10-CM | POA: Diagnosis not present

## 2023-11-12 DIAGNOSIS — B3781 Candidal esophagitis: Secondary | ICD-10-CM | POA: Diagnosis not present

## 2023-11-12 DIAGNOSIS — K5731 Diverticulosis of large intestine without perforation or abscess with bleeding: Secondary | ICD-10-CM | POA: Diagnosis present

## 2023-11-12 DIAGNOSIS — I2510 Atherosclerotic heart disease of native coronary artery without angina pectoris: Secondary | ICD-10-CM | POA: Diagnosis not present

## 2023-11-12 DIAGNOSIS — Z79899 Other long term (current) drug therapy: Secondary | ICD-10-CM | POA: Diagnosis not present

## 2023-11-12 DIAGNOSIS — K449 Diaphragmatic hernia without obstruction or gangrene: Secondary | ICD-10-CM | POA: Diagnosis not present

## 2023-11-12 DIAGNOSIS — E039 Hypothyroidism, unspecified: Secondary | ICD-10-CM | POA: Diagnosis present

## 2023-11-12 DIAGNOSIS — K5521 Angiodysplasia of colon with hemorrhage: Secondary | ICD-10-CM | POA: Diagnosis present

## 2023-11-12 DIAGNOSIS — Q2733 Arteriovenous malformation of digestive system vessel: Secondary | ICD-10-CM | POA: Diagnosis not present

## 2023-11-12 DIAGNOSIS — K5520 Angiodysplasia of colon without hemorrhage: Secondary | ICD-10-CM | POA: Diagnosis not present

## 2023-11-12 DIAGNOSIS — I110 Hypertensive heart disease with heart failure: Secondary | ICD-10-CM | POA: Diagnosis not present

## 2023-11-12 DIAGNOSIS — Z888 Allergy status to other drugs, medicaments and biological substances status: Secondary | ICD-10-CM | POA: Diagnosis not present

## 2023-11-12 DIAGNOSIS — Z8249 Family history of ischemic heart disease and other diseases of the circulatory system: Secondary | ICD-10-CM | POA: Diagnosis not present

## 2023-11-12 DIAGNOSIS — Z7901 Long term (current) use of anticoagulants: Secondary | ICD-10-CM | POA: Diagnosis not present

## 2023-11-12 DIAGNOSIS — Z885 Allergy status to narcotic agent status: Secondary | ICD-10-CM | POA: Diagnosis not present

## 2023-11-12 DIAGNOSIS — I340 Nonrheumatic mitral (valve) insufficiency: Secondary | ICD-10-CM | POA: Diagnosis present

## 2023-11-12 DIAGNOSIS — J439 Emphysema, unspecified: Secondary | ICD-10-CM | POA: Diagnosis not present

## 2023-11-12 DIAGNOSIS — I482 Chronic atrial fibrillation, unspecified: Secondary | ICD-10-CM | POA: Diagnosis present

## 2023-11-12 DIAGNOSIS — E785 Hyperlipidemia, unspecified: Secondary | ICD-10-CM | POA: Diagnosis present

## 2023-11-12 DIAGNOSIS — Z87891 Personal history of nicotine dependence: Secondary | ICD-10-CM | POA: Diagnosis not present

## 2023-11-12 DIAGNOSIS — I5032 Chronic diastolic (congestive) heart failure: Secondary | ICD-10-CM | POA: Diagnosis not present

## 2023-11-12 DIAGNOSIS — I130 Hypertensive heart and chronic kidney disease with heart failure and stage 1 through stage 4 chronic kidney disease, or unspecified chronic kidney disease: Secondary | ICD-10-CM | POA: Diagnosis present

## 2023-11-12 DIAGNOSIS — K5730 Diverticulosis of large intestine without perforation or abscess without bleeding: Secondary | ICD-10-CM | POA: Diagnosis not present

## 2023-11-12 DIAGNOSIS — G4733 Obstructive sleep apnea (adult) (pediatric): Secondary | ICD-10-CM | POA: Diagnosis present

## 2023-11-12 DIAGNOSIS — I4892 Unspecified atrial flutter: Secondary | ICD-10-CM | POA: Diagnosis present

## 2023-11-12 DIAGNOSIS — K921 Melena: Secondary | ICD-10-CM | POA: Diagnosis not present

## 2023-11-12 DIAGNOSIS — Z79890 Hormone replacement therapy (postmenopausal): Secondary | ICD-10-CM | POA: Diagnosis not present

## 2023-11-12 DIAGNOSIS — Z8261 Family history of arthritis: Secondary | ICD-10-CM | POA: Diagnosis not present

## 2023-11-12 DIAGNOSIS — D649 Anemia, unspecified: Secondary | ICD-10-CM | POA: Diagnosis not present

## 2023-11-12 DIAGNOSIS — Z7984 Long term (current) use of oral hypoglycemic drugs: Secondary | ICD-10-CM | POA: Diagnosis not present

## 2023-11-12 HISTORY — PX: ESOPHAGOGASTRODUODENOSCOPY: SHX5428

## 2023-11-12 LAB — BPAM RBC
Blood Product Expiration Date: 202509112359
ISSUE DATE / TIME: 202508121531
Unit Type and Rh: 6200

## 2023-11-12 LAB — TYPE AND SCREEN
ABO/RH(D): A POS
Antibody Screen: NEGATIVE
Unit division: 0

## 2023-11-12 LAB — COMPREHENSIVE METABOLIC PANEL WITH GFR
ALT: 13 U/L (ref 0–44)
AST: 17 U/L (ref 15–41)
Albumin: 3.3 g/dL — ABNORMAL LOW (ref 3.5–5.0)
Alkaline Phosphatase: 63 U/L (ref 38–126)
Anion gap: 8 (ref 5–15)
BUN: 24 mg/dL — ABNORMAL HIGH (ref 8–23)
CO2: 26 mmol/L (ref 22–32)
Calcium: 8.8 mg/dL — ABNORMAL LOW (ref 8.9–10.3)
Chloride: 103 mmol/L (ref 98–111)
Creatinine, Ser: 1.37 mg/dL — ABNORMAL HIGH (ref 0.61–1.24)
GFR, Estimated: 50 mL/min — ABNORMAL LOW (ref >60)
Glucose, Bld: 129 mg/dL — ABNORMAL HIGH (ref 70–99)
Potassium: 4 mmol/L (ref 3.5–5.1)
Sodium: 137 mmol/L (ref 135–145)
Total Bilirubin: 0.8 mg/dL (ref 0.0–1.2)
Total Protein: 6.9 g/dL (ref 6.5–8.1)

## 2023-11-12 LAB — CBC
HCT: 28.1 % — ABNORMAL LOW (ref 39.0–52.0)
Hemoglobin: 8.3 g/dL — ABNORMAL LOW (ref 13.0–17.0)
MCH: 23.6 pg — ABNORMAL LOW (ref 26.0–34.0)
MCHC: 29.5 g/dL — ABNORMAL LOW (ref 30.0–36.0)
MCV: 79.8 fL — ABNORMAL LOW (ref 80.0–100.0)
Platelets: 202 K/uL (ref 150–400)
RBC: 3.52 MIL/uL — ABNORMAL LOW (ref 4.22–5.81)
RDW: 19.5 % — ABNORMAL HIGH (ref 11.5–15.5)
WBC: 3.8 K/uL — ABNORMAL LOW (ref 4.0–10.5)
nRBC: 0 % (ref 0.0–0.2)

## 2023-11-12 LAB — GLUCOSE, CAPILLARY
Glucose-Capillary: 135 mg/dL — ABNORMAL HIGH (ref 70–99)
Glucose-Capillary: 117 mg/dL — ABNORMAL HIGH (ref 70–99)
Glucose-Capillary: 102 mg/dL — ABNORMAL HIGH (ref 70–99)
Glucose-Capillary: 102 mg/dL — ABNORMAL HIGH (ref 70–99)

## 2023-11-12 LAB — PROTIME-INR
INR: 1.5 — ABNORMAL HIGH (ref 0.8–1.2)
Prothrombin Time: 18.9 s — ABNORMAL HIGH (ref 11.4–15.2)

## 2023-11-12 SURGERY — EGD (ESOPHAGOGASTRODUODENOSCOPY)
Anesthesia: Monitor Anesthesia Care

## 2023-11-12 MED ORDER — NA SULFATE-K SULFATE-MG SULF 17.5-3.13-1.6 GM/177ML PO SOLN
0.5000 | Freq: Once | ORAL | Status: AC
  Administered 2023-11-12 (×2): 177 mL via ORAL

## 2023-11-12 MED ORDER — LIDOCAINE HCL (CARDIAC) PF 100 MG/5ML IV SOSY
PREFILLED_SYRINGE | INTRAVENOUS | Status: DC | PRN
Start: 2023-11-12 — End: 2023-11-12
  Administered 2023-11-12 (×2): 60 mg via INTRAVENOUS

## 2023-11-12 MED ORDER — SODIUM CHLORIDE 0.9 % IV SOLN: INTRAVENOUS | Status: AC

## 2023-11-12 MED ORDER — BISACODYL 5 MG PO TBEC
10.0000 mg | DELAYED_RELEASE_TABLET | Freq: Once | ORAL | Status: DC
  Filled 2023-11-12: qty 2

## 2023-11-12 MED ORDER — PROPOFOL 500 MG/50ML IV EMUL
INTRAVENOUS | Status: DC | PRN
  Administered 2023-11-12: 30 mg via INTRAVENOUS
  Administered 2023-11-12: 135 ug/kg/min via INTRAVENOUS
  Administered 2023-11-12: 30 mg via INTRAVENOUS
  Administered 2023-11-12: 135 ug/kg/min via INTRAVENOUS

## 2023-11-12 MED ORDER — NA SULFATE-K SULFATE-MG SULF 17.5-3.13-1.6 GM/177ML PO SOLN
0.5000 | Freq: Once | ORAL | Status: AC
  Administered 2023-11-12 (×2): 177 mL via ORAL
  Filled 2023-11-12: qty 1

## 2023-11-12 NOTE — Progress Notes (Signed)
 PROGRESS NOTE  [name] White FMW:[id number] DOB: 10/09/1935 DOA: 11/11/2023 PCP: Garald Karlynn LULLA, MD  HPI/Recap of past 24 hours: [name] White is a 88 y.o. male with medical history significant of normocytic anemia, anxiety, atrial fibrillation/flutter on apixaban , status post ablation, chronic diastolic CHF, CAD, depression, type 2 diabetes, lung emphysema, hypertension, hypothyroidism, severe arctic stenosis status post TAVR,GERD/hiatal hernia, gastritis, duodenal ulcer who presented to the ED with complaints of dizziness, dyspnea and melena over the past 2 weeks.  He received a blood transfusion last week.  In the ED, vital signs fairly stable except for BP with SBP in the 90s. Lab work: CBC showed hemoglobin 7.7 g/dL and platelets 753.  PT 20.6 and INR 1.7.  GI consulted.  Patient admitted for further management.   Today, patient denies any recent melena, abdominal pain, chest pain, shortness of breath, nausea/vomiting, fever/chills.    Assessment/Plan: Principal Problem:   Melena Active Problems:   Hypothyroidism   Hyperlipidemia   Depression with anxiety   Essential hypertension   GERD   Obstructive sleep apnea   Chronic atrial fibrillation (HCC)   Coronary atherosclerosis of native coronary artery   Primary open-angle glaucoma, bilateral, mild stage   Type 2 diabetes mellitus with hyperglycemia (HCC)   Acute on chronic blood loss anemia   Mitral valve insufficiency   Melena Acute on chronic blood loss anemia  Hemoglobin stable, no further melena GI consulted, s/p EGD which showed possible esophageal candidiasis, biopsied, 4 cm hiatal hernia, previously seen duodenal ulcer/esophagitis have healed.  No suspected bleeding sources identified.  Plan for colonoscopy on 8/14 Clear liquid diet, n.p.o. after midnight Continue PPI Daily CBC  Possible CKD stage 2 Daily BMP  Diabetes mellitus type 2 Last A1c 7.3 SSI, Accu-Cheks, hypoglycemic  protocol  Hypothyroidism Levothyroxine  175 mcg p.o. daily   Hyperlipidemia Continue rosuvastatin  10 mg p.o. daily   Essential hypertension Hold diuretics and antihypertensives.   Chronic atrial fibrillation Rate controlled Hold anticoagulation  Primary open-angle glaucoma, bilateral, mild stage Continue timolol  and Xalatan  drops.  Depression with anxiety Continue paroxetine  30 mg p.o. daily. Continue trazodone  75 mg p.o. bedtime.    Estimated body mass index is 27.74 kg/m as calculated from the following:   Height as of this encounter: 6' 2 (1.88 m).   Weight as of this encounter: 98 kg.     Code Status: Full  Family Communication: Wife at bedside  Disposition Plan: Status is: Inpatient Remains inpatient appropriate because: Level of care      Consultants: GI  Procedures: EGD  Antimicrobials: None  DVT prophylaxis: SCDs   Objective: Vitals:   11/12/23 1430 11/12/23 1440 11/12/23 1450 11/12/23 1648  BP: (!) 105/51 (!) 119/59 (!) 105/52 117/66  Pulse: 74 77 70 71  Resp: 17 16 17 16   Temp:    97.7 F (36.5 C)  TempSrc:    Oral  SpO2: 98% 93% (!) 87% 92%  Weight:      Height:        Intake/Output Summary (Last 24 hours) at 11/12/2023 1734 Last data filed at 11/11/2023 1922 Gross per 24 hour  Intake 353.33 ml  Output 300 ml  Net 53.33 ml   Filed Weights   11/11/23 0853  Weight: 98 kg    Exam:  General: NAD  Cardiovascular: S1, S2 present Respiratory: CTAB Abdomen: Soft, nontender, nondistended, bowel sounds present Musculoskeletal: No bilateral pedal edema noted Skin: Normal Psychiatry: Normal mood   Data Reviewed: CBC: Recent Labs  Lab 11/11/23 0942 11/12/23 0414  WBC 4.6 3.8*  NEUTROABS 3.3  --   HGB 7.7* 8.3*  HCT 26.9* 28.1*  MCV 79.1* 79.8*  PLT 246 202   Basic Metabolic Panel: Recent Labs  Lab 11/11/23 0942 11/12/23 0414  NA 136 137  K 4.3 4.0  CL 100 103  CO2 25 26  GLUCOSE 210* 129*  BUN 28* 24*   CREATININE 1.48* 1.37*  CALCIUM  9.2 8.8*  MG 2.4  --    GFR: Estimated Creatinine Clearance: 45 mL/min (A) (by C-G formula based on SCr of 1.37 mg/dL (H)). Liver Function Tests: Recent Labs  Lab 11/11/23 0942 11/12/23 0414  AST 21 17  ALT 13 13  ALKPHOS 70 63  BILITOT 0.9 0.8  PROT 7.4 6.9  ALBUMIN 3.5 3.3*   Recent Labs  Lab 11/11/23 0942  LIPASE 35   No results for input(s): AMMONIA in the last 168 hours. Coagulation Profile: Recent Labs  Lab 11/11/23 0942 11/12/23 0414  INR 1.7* 1.5*   Cardiac Enzymes: No results for input(s): CKTOTAL, CKMB, CKMBINDEX, TROPONINI in the last 168 hours. BNP (last 3 results) No results for input(s): PROBNP in the last 8760 hours. HbA1C: No results for input(s): HGBA1C in the last 72 hours. CBG: Recent Labs  Lab 11/11/23 1730 11/11/23 2156 11/12/23 0732 11/12/23 1111 11/12/23 1649  GLUCAP 238* 156* 135* 117* 102*   Lipid Profile: No results for input(s): CHOL, HDL, LDLCALC, TRIG, CHOLHDL, LDLDIRECT in the last 72 hours. Thyroid  Function Tests: No results for input(s): TSH, T4TOTAL, FREET4, T3FREE, THYROIDAB in the last 72 hours. Anemia Panel: No results for input(s): VITAMINB12, FOLATE, FERRITIN, TIBC, IRON, RETICCTPCT in the last 72 hours. Urine analysis:    Component Value Date/Time   COLORURINE YELLOW 09/24/2023 1137   APPEARANCEUR CLEAR 09/24/2023 1137   LABSPEC <=1.005 (A) 09/24/2023 1137   PHURINE 6.0 09/24/2023 1137   GLUCOSEU >=1000 (A) 09/24/2023 1137   HGBUR NEGATIVE 09/24/2023 1137   HGBUR 2+ 04/03/2009 0757   BILIRUBINUR NEGATIVE 09/24/2023 1137   BILIRUBINUR 1+ 09/13/2013 1226   KETONESUR NEGATIVE 09/24/2023 1137   PROTEINUR NEGATIVE 04/09/2019 1208   UROBILINOGEN 0.2 09/24/2023 1137   NITRITE NEGATIVE 09/24/2023 1137   LEUKOCYTESUR NEGATIVE 09/24/2023 1137   Sepsis Labs: @LABRCNTIP (procalcitonin:4,lacticidven:4)  )No results found for this or any  previous visit (from the past 240 hours).    Studies: No results found.  Scheduled Meds:  empagliflozin   10 mg Oral Daily   finasteride   5 mg Oral QHS   insulin  aspart  0-9 Units Subcutaneous TID WC   latanoprost   1 drop Both Eyes QHS   levothyroxine   175 mcg Oral Q0600   mirabegron  ER  50 mg Oral Daily   Na Sulfate-K Sulfate-Mg Sulfate concentrate  0.5 kit Oral Once   Followed by   Na Sulfate-K Sulfate-Mg Sulfate concentrate  0.5 kit Oral Once   pantoprazole  (PROTONIX ) IV  40 mg Intravenous Q12H   PARoxetine   30 mg Oral Daily   rosuvastatin   10 mg Oral Daily   timolol   1 drop Both Eyes Daily   traZODone   75 mg Oral QHS    Continuous Infusions:  sodium chloride        LOS: 0 days     Lebron JINNY Cage, MD [facility]  If 7PM-7AM, please contact night-coverage [email or web address] 11/12/2023, 5:34 PM

## 2023-11-12 NOTE — Progress Notes (Signed)
   11/12/23 2036  BiPAP/CPAP/SIPAP  BiPAP/CPAP/SIPAP Pt Type Adult  Reason BIPAP/CPAP not in use Non-compliant (Pt refusing cpap for the night)

## 2023-11-12 NOTE — Op Note (Signed)
 [facility] Patient Name: [name] White Procedure Date: 11/12/2023 MRN: [id number] Attending MD: Glendia BRAVO. Stacia , MD, [id number] Date of Birth: [birth year] CSN: [id number] Age: 88 Admit Type: Inpatient Procedure:                Upper GI endoscopy Indications:              Iron deficiency anemia secondary to chronic blood                            loss, Melena Providers:                Glendia E. Stacia, MD, Hoy Penner, RN,                            Fairy Marina, Technician Referring MD:              Medicines:                Monitored Anesthesia Care Complications:            No immediate complications. Estimated Blood Loss:     Estimated blood loss was minimal. Procedure:                Pre-Anesthesia Assessment:                           - Prior to the procedure, a History and Physical                            was performed, and patient medications and                            allergies were reviewed. The patient's tolerance of                            previous anesthesia was also reviewed. The risks                            and benefits of the procedure and the sedation                            options and risks were discussed with the patient.                            All questions were answered, and informed consent                            was obtained. Prior Anticoagulants: The patient has                            taken Eliquis  (apixaban ), last dose was 1 day prior                            to procedure. ASA Grade Assessment: III - A patient  with severe systemic disease. After reviewing the                            risks and benefits, the patient was deemed in                            satisfactory condition to undergo the procedure.                           After obtaining informed consent, the endoscope was                            passed under direct vision. Throughout the                             procedure, the patient's blood pressure, pulse, and                            oxygen saturations were monitored continuously. The                            [id number]-[id number] ([id number]) Olympus endoscope was introduced                            through the mouth, and advanced to the second part                            of duodenum. The upper GI endoscopy was                            accomplished without difficulty. The patient                            tolerated the procedure well. Scope In: Scope Out: Findings:      Patchy, white plaques were found in the middle third of the esophagus       and in the lower third of the esophagus. Biopsies were taken with a cold       forceps for histology. Estimated blood loss was minimal.      The exam of the esophagus was otherwise normal.      A 4 cm hiatal hernia was present.      The entire examined stomach was normal.      The examined duodenum was normal. Impression:               - Esophageal plaques were found, possible                            candidiasis. Biopsied.                           - 4 cm hiatal hernia.                           - Normal stomach.                           -  Normal examined duodenum.                           - The previously seen duodenal ulcer, antral                            erosions and esophagitis have healed. No suspected                            bleeding sources identified. Moderate Sedation:      Not Applicable - Patient had care per Anesthesia. Recommendation:           - Return patient to hospital ward for ongoing care.                           - Clear liquid diet.                           - Continue present medications.                           - Await pathology results.                           - Plan for colonoscopy tomorrow.                           - Continue to hold Eliquis . Procedure Code(s):        --- Professional ---                           [phone], Esophagogastroduodenoscopy,  flexible,                            transoral; with biopsy, single or multiple Diagnosis Code(s):        --- Professional ---                           K22.9, Disease of esophagus, unspecified                           K44.9, Diaphragmatic hernia without obstruction or                            gangrene                           D50.0, Iron deficiency anemia secondary to blood                            loss (chronic)                           K92.1, Melena (includes Hematochezia) CPT copyright 2022 [employer]. All rights reserved. The codes documented in this report are preliminary and upon coder review may  be revised to meet current compliance requirements. Luwanna Brossman E. Stacia, MD 11/12/2023 2:31:11 PM This report has been signed electronically.  Number of Addenda: 0

## 2023-11-12 NOTE — H&P (View-Only) (Signed)
 [facility] Progress Note  CC: Anemia, melena   Subjective: No melena or BM overnight or thus far this morning. No nausea or vomiting. No abdominal pain. No chest pain or shortness of breath.  He remains NPO for EGD this afternoon.  No family at the bedside.  Objective:  Vital signs in last 24 hours: Temp:  [97.3 F (36.3 C)-98.8 F (37.1 C)] 98.5 F (36.9 C) (08/13 0542) Pulse Rate:  [65-74] 73 (08/13 0542) Resp:  [10-22] 22 (08/13 0542) BP: (101-115)/(61-86) 110/70 (08/13 0542) SpO2:  [91 %-97 %] 94 % (08/13 0542) Last BM Date : 11/10/23 General: 88 year old male alert in no acute distress. Heart: Regular rhythm, soft systolic murmur. Pulm: Breath sounds clear throughout, on room air. Abdomen: Soft, nondistended.  Nontender.  Positive bowel sounds to all 4 quadrants. Extremities:  No edema. SCD in use. Neurologic:  Alert and oriented x 4.  Speech is clear.  Moves all extremities equally. Psych:  Alert and cooperative. Normal mood and affect.  Intake/Output from previous day: 08/12 0701 - 08/13 0700 In: 603.3 [I.V.:265.3; Blood:338] Out: 1000 [Urine:1000] Intake/Output this shift: No intake/output data recorded.  Lab Results: Recent Labs    11/11/23 0942 11/12/23 0414  WBC 4.6 3.8*  HGB 7.7* 8.3*  HCT 26.9* 28.1*  PLT 246 202   BMET Recent Labs    11/11/23 0942 11/12/23 0414  NA 136 137  K 4.3 4.0  CL 100 103  CO2 25 26  GLUCOSE 210* 129*  BUN 28* 24*  CREATININE 1.48* 1.37*  CALCIUM  9.2 8.8*   LFT Recent Labs    11/12/23 0414  PROT 6.9  ALBUMIN 3.3*  AST 17  ALT 13  ALKPHOS 63  BILITOT 0.8   PT/INR Recent Labs    11/11/23 0942 11/12/23 0414  LABPROT 20.6* 18.9*  INR 1.7* 1.5*   Hepatitis Panel No results for input(s): HEPBSAG, HCVAB, HEPAIGM, HEPBIGM in the last 72 hours.  No results found.  Patient Profile: Erle Guster is a 88 y.o. male with a past medical history of anxiety, depression, hypertension,  nonobstructive coronary artery disease, severe AS s/p TAVR 04/2019, MR, atrial flutter s/p ablation 2010, chronic diastolic CHF with LV EF 50 - 55%, diabetes mellitus type 2, OSA, chronic myeloid leukemia, remote history of colon polyps in the 1990s, GERD, large hiatal hernia, gastritis and a duodenal ulcer. Admitted 11/11/2023 with persistent melena and anemia.     Assessment / Plan:  88 year old male with acute on chronic IDA with chronically dark stools and melena x 2 days prior to admission. Suspect, UGI bleed. Hg 7.7 (Hg 6.5 on 8/6, transfused 2 units of PRBCs 8/8 - 8/9 per oncology as an outpatient) -> transfused one unit of PRBCs 8/12 -> Hg 7.7 -> today Hg 8.3.  INR 1.7 -> 1.5. Hemodynamically stable.  - NPO  - Patient to proceed with EGD today with Dr. Stacia, benefits and risks discussed including risk with sedation, risk of bleeding, perforation and infection, timing to be determined. - CBC in am - Continue PPI IV bid - Transfuse for Hg < 8 - Continue to hold Eliquis     GERD, duodenal ulcer and hiatal hernia. EGD 09/05/2023 showed nonerosive esophagitis, antral erosions, duodenal bulb ulcer without stigmata of recent bleeding.  Patient was prescribed Pantoprazole  40 mg daily indefinitely following his EGD 08/2023.  -See plan above    Nonobstructive CAD, no angina   AS s/p TAVR 2021. Severe MR, given minimal  symptoms and calcified leaflet cardiology holding off on mitral valve repair    Atrial flutter s/p ablation on Eliquis , last  dose 6am -Continue to hold Eliquis   -Consider Watchman's procedure, follow-up with cardiology as an outpatient   Chronic diastolic CHF   CML - Follow-up with oncology as an outpatient   Renal insufficiency. Cr 1.48 -> 1.37.    Principal Problem:   Melena Active Problems:   Hypothyroidism   Hyperlipidemia   Depression with anxiety   Essential hypertension   GERD   Obstructive sleep apnea   Chronic atrial fibrillation (HCC)   Coronary  atherosclerosis of native coronary artery   Primary open-angle glaucoma, bilateral, mild stage   Type 2 diabetes mellitus with hyperglycemia (HCC)   Acute on chronic blood loss anemia     LOS: 0 days   Elida HERO Kennedy-Smith  11/12/2023, 10:18 AM

## 2023-11-12 NOTE — Transfer of Care (Signed)
 Immediate Anesthesia Transfer of Care Note  Patient: [name] White  Procedure(s) Performed: Procedure(s): EGD (ESOPHAGOGASTRODUODENOSCOPY) (N/A)  Patient Location: Endoscopy Unit  Anesthesia Type:MAC  Level of Consciousness:  sedated, patient cooperative and responds to stimulation  Airway & Oxygen Therapy:Patient Spontanous Breathing and Patient connected to face mask oxgen  Post-op Assessment:  Report given to PACU RN and Post -op Vital signs reviewed and stable  Post vital signs:  Reviewed and stable  Last Vitals:  Vitals:   11/12/23 1316 11/12/23 1425  BP: 126/70 (!) 85/46  Pulse: 76 71  Resp: 14 18  Temp: 36.5 C 36.5 C  SpO2: 95% 99%    Complications: No apparent anesthesia complications

## 2023-11-12 NOTE — Anesthesia Preprocedure Evaluation (Addendum)
 Anesthesia Evaluation  Patient identified by MRN, date of birth, ID band Patient awake    Reviewed: Allergy & Precautions, NPO status , Patient's Chart, lab work & pertinent test results  Airway Mallampati: II  TM Distance: >3 FB Neck ROM: Full    Dental no notable dental hx. (+) Teeth Intact, Dental Advisory Given   Pulmonary sleep apnea and Continuous Positive Airway Pressure Ventilation , COPD, former smoker   Pulmonary exam normal breath sounds clear to auscultation       Cardiovascular hypertension, Pt. on medications + CAD, +CHF and + DOE  Normal cardiovascular exam+ dysrhythmias Atrial Fibrillation + Valvular Problems/Murmurs (s/p TAVR 2021, severe MR) AS and MR  Rhythm:Regular Rate:Normal  TEE 2025  1. Left ventricular ejection fraction, by estimation, is 50 to 55%. The  left ventricle has low normal function.   2. RVSP mildly elevated but likely underestimates true PA pressures..  Right ventricular systolic function is normal. The right ventricular size  is normal. There is mildly elevated pulmonary artery systolic pressure.   3. Left atrial size was moderately dilated. No left atrial/left atrial  appendage thrombus was detected.   4. Right atrial size was mildly dilated.   5. Severe mitral regurgitation with reversal of flow in the pulmonary  veins. MVA 6.1 cm2. EROA 0.75cm2, MR volume . Severely restricted  posterior leaflet measured at around 1cm. Will refer for assessment of  mTEER.. The mitral valve is abnormal.  Severe mitral valve regurgitation. No evidence of mitral stenosis. The  mean mitral valve gradient is 3.0 mmHg.   6. Tricuspid valve regurgitation is mild to moderate.   7. The aortic valve has been repaired/replaced. Aortic valve  regurgitation is not visualized. 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 Procedure Date:  04/13/19. Echo findings are  consistent with normal  structure and function of the aortic valve prosthesis. Aortic valve mean  gradient measures 10.0 mmHg. Aortic valve Vmax measures 2.14 m/s.   8. There is mild (Grade II) plaque.   9. The left upper pulmonary vein and right upper pulmonary vein are  abnormal.   Cath 2025 1.  Patent coronary arteries with nonobstructive CAD as detailed below 2.  Normal TAVR function with peak to peak gradient of 9 mmHg 3.  Large V waves of 34 mmHg suggestive of hemodynamically significant mitral regurgitation 4.  Mean pulmonary artery pressure 31 mmHg, transpulmonary gradient 10 mmHg, PVR 1.37 Wood units.      Neuro/Psych  PSYCHIATRIC DISORDERS Anxiety Depression    negative neurological ROS     GI/Hepatic hiatal hernia, PUD,GERD  ,,  Endo/Other  diabetes, Type 2, Oral Hypoglycemic AgentsHypothyroidism    Renal/GU Renal InsufficiencyRenal diseaseLab Results      Component                Value               Date                      NA                       137                 11/12/2023                CL  103                 11/12/2023                K                        4.0                 11/12/2023                CO2                      26                  11/12/2023                BUN                      24 (H)              11/12/2023                CREATININE               1.37 (H)            11/12/2023                GFRNONAA                 50 (L)              11/12/2023                CALCIUM                   8.8 (L)             11/12/2023                PHOS                     3.7                 02/01/2014                ALBUMIN                  3.3 (L)             11/12/2023                GLUCOSE                  129 (H)             11/12/2023             negative genitourinary   Musculoskeletal  (+) Arthritis ,    Abdominal   Peds  Hematology  (+) Blood dyscrasia (eliquis ), anemia Lab Results      Component                 Value               Date                      WBC                      3.8 (L)  11/12/2023                HGB                      8.3 (L)             11/12/2023                HCT                      28.1 (L)            11/12/2023                MCV                      79.8 (L)            11/12/2023                PLT                      202                 11/12/2023              Anesthesia Other Findings   Reproductive/Obstetrics                              Anesthesia Physical Anesthesia Plan  ASA: 4  Anesthesia Plan: MAC   Post-op Pain Management:    Induction: Intravenous  PONV Risk Score and Plan: Propofol  infusion and Treatment may vary due to age or medical condition  Airway Management Planned: Natural Airway  Additional Equipment:   Intra-op Plan:   Post-operative Plan:   Informed Consent: I have reviewed the patients History and Physical, chart, labs and discussed the procedure including the risks, benefits and alternatives for the proposed anesthesia with the patient or authorized representative who has indicated his/her understanding and acceptance.     Dental advisory given  Plan Discussed with: CRNA  Anesthesia Plan Comments:          Anesthesia Quick Evaluation

## 2023-11-12 NOTE — Interval H&P Note (Signed)
 History and Physical Interval Note:  11/12/2023 1:23 PM  [name] White  has presented today for surgery, with the diagnosis of Anemia, melena.  The various methods of treatment have been discussed with the patient and family. After consideration of risks, benefits and other options for treatment, the patient has consented to  Procedure(s): EGD (ESOPHAGOGASTRODUODENOSCOPY) (N/A) as a surgical intervention.  The patient's history has been reviewed, patient examined, no change in status, stable for surgery.  I have reviewed the patient's chart and labs.  Questions were answered to the patient's satisfaction.     Glendia FORBES Holt

## 2023-11-12 NOTE — Progress Notes (Signed)
 [facility] Progress Note  CC: Anemia, melena   Subjective: No melena or BM overnight or thus far this morning. No nausea or vomiting. No abdominal pain. No chest pain or shortness of breath.  He remains NPO for EGD this afternoon.  No family at the bedside.  Objective:  Vital signs in last 24 hours: Temp:  [97.3 F (36.3 C)-98.8 F (37.1 C)] 98.5 F (36.9 C) (08/13 0542) Pulse Rate:  [65-74] 73 (08/13 0542) Resp:  [10-22] 22 (08/13 0542) BP: (101-115)/(61-86) 110/70 (08/13 0542) SpO2:  [91 %-97 %] 94 % (08/13 0542) Last BM Date : 11/10/23 General: 88 year old male alert in no acute distress. Heart: Regular rhythm, soft systolic murmur. Pulm: Breath sounds clear throughout, on room air. Abdomen: Soft, nondistended.  Nontender.  Positive bowel sounds to all 4 quadrants. Extremities:  No edema. SCD in use. Neurologic:  Alert and oriented x 4.  Speech is clear.  Moves all extremities equally. Psych:  Alert and cooperative. Normal mood and affect.  Intake/Output from previous day: 08/12 0701 - 08/13 0700 In: 603.3 [I.V.:265.3; Blood:338] Out: 1000 [Urine:1000] Intake/Output this shift: No intake/output data recorded.  Lab Results: Recent Labs    11/11/23 0942 11/12/23 0414  WBC 4.6 3.8*  HGB 7.7* 8.3*  HCT 26.9* 28.1*  PLT 246 202   BMET Recent Labs    11/11/23 0942 11/12/23 0414  NA 136 137  K 4.3 4.0  CL 100 103  CO2 25 26  GLUCOSE 210* 129*  BUN 28* 24*  CREATININE 1.48* 1.37*  CALCIUM  9.2 8.8*   LFT Recent Labs    11/12/23 0414  PROT 6.9  ALBUMIN 3.3*  AST 17  ALT 13  ALKPHOS 63  BILITOT 0.8   PT/INR Recent Labs    11/11/23 0942 11/12/23 0414  LABPROT 20.6* 18.9*  INR 1.7* 1.5*   Hepatitis Panel No results for input(s): HEPBSAG, HCVAB, HEPAIGM, HEPBIGM in the last 72 hours.  No results found.  Patient Profile: [name] White is a 88 y.o. male with a past medical history of anxiety, depression, hypertension,  nonobstructive coronary artery disease, severe AS s/p TAVR 04/2019, MR, atrial flutter s/p ablation 2010, chronic diastolic CHF with LV EF 50 - 55%, diabetes mellitus type 2, OSA, chronic myeloid leukemia, remote history of colon polyps in the 1990s, GERD, large hiatal hernia, gastritis and a duodenal ulcer. Admitted 11/11/2023 with persistent melena and anemia.     Assessment / Plan:  88 year old male with acute on chronic IDA with chronically dark stools and melena x 2 days prior to admission. Suspect, UGI bleed. Hg 7.7 (Hg 6.5 on 8/6, transfused 2 units of PRBCs 8/8 - 8/9 per oncology as an outpatient) -> transfused one unit of PRBCs 8/12 -> Hg 7.7 -> today Hg 8.3.  INR 1.7 -> 1.5. Hemodynamically stable.  - NPO  - Patient to proceed with EGD today with Dr. Stacia, benefits and risks discussed including risk with sedation, risk of bleeding, perforation and infection, timing to be determined. - CBC in am - Continue PPI IV bid - Transfuse for Hg < 8 - Continue to hold Eliquis     GERD, duodenal ulcer and hiatal hernia. EGD 09/05/2023 showed nonerosive esophagitis, antral erosions, duodenal bulb ulcer without stigmata of recent bleeding.  Patient was prescribed Pantoprazole  40 mg daily indefinitely following his EGD 08/2023.  -See plan above    Nonobstructive CAD, no angina   AS s/p TAVR 2021. Severe MR, given minimal  symptoms and calcified leaflet cardiology holding off on mitral valve repair    Atrial flutter s/p ablation on Eliquis , last  dose 6am -Continue to hold Eliquis   -Consider Watchman's procedure, follow-up with cardiology as an outpatient   Chronic diastolic CHF   CML - Follow-up with oncology as an outpatient   Renal insufficiency. Cr 1.48 -> 1.37.    Principal Problem:   Melena Active Problems:   Hypothyroidism   Hyperlipidemia   Depression with anxiety   Essential hypertension   GERD   Obstructive sleep apnea   Chronic atrial fibrillation (HCC)   Coronary  atherosclerosis of native coronary artery   Primary open-angle glaucoma, bilateral, mild stage   Type 2 diabetes mellitus with hyperglycemia (HCC)   Acute on chronic blood loss anemia     LOS: 0 days   [name] White  11/12/2023, 10:18 AM

## 2023-11-13 ENCOUNTER — Ambulatory Visit: Admitting: Physician Assistant

## 2023-11-13 ENCOUNTER — Encounter (HOSPITAL_COMMUNITY)
Admission: EM | Disposition: A | Discharge status: Home / Self Care | Payer: Self-pay | Source: Ambulatory Visit | Attending: Internal Medicine

## 2023-11-13 ENCOUNTER — Encounter (HOSPITAL_COMMUNITY): Payer: Self-pay | Admitting: Gastroenterology

## 2023-11-13 ENCOUNTER — Inpatient Hospital Stay (HOSPITAL_COMMUNITY): Admitting: Anesthesiology

## 2023-11-13 DIAGNOSIS — I2510 Atherosclerotic heart disease of native coronary artery without angina pectoris: Secondary | ICD-10-CM

## 2023-11-13 DIAGNOSIS — K5730 Diverticulosis of large intestine without perforation or abscess without bleeding: Secondary | ICD-10-CM

## 2023-11-13 DIAGNOSIS — K5521 Angiodysplasia of colon with hemorrhage: Secondary | ICD-10-CM

## 2023-11-13 DIAGNOSIS — K921 Melena: Secondary | ICD-10-CM | POA: Diagnosis not present

## 2023-11-13 DIAGNOSIS — I5032 Chronic diastolic (congestive) heart failure: Secondary | ICD-10-CM

## 2023-11-13 DIAGNOSIS — I110 Hypertensive heart disease with heart failure: Secondary | ICD-10-CM

## 2023-11-13 DIAGNOSIS — K5520 Angiodysplasia of colon without hemorrhage: Secondary | ICD-10-CM

## 2023-11-13 DIAGNOSIS — Q2733 Arteriovenous malformation of digestive system vessel: Secondary | ICD-10-CM

## 2023-11-13 DIAGNOSIS — D500 Iron deficiency anemia secondary to blood loss (chronic): Secondary | ICD-10-CM

## 2023-11-13 HISTORY — PX: COLONOSCOPY: SHX5424

## 2023-11-13 LAB — CBC
HCT: 30.5 % — ABNORMAL LOW (ref 39.0–52.0)
Hemoglobin: 8.8 g/dL — ABNORMAL LOW (ref 13.0–17.0)
MCH: 23.8 pg — ABNORMAL LOW (ref 26.0–34.0)
MCHC: 28.9 g/dL — ABNORMAL LOW (ref 30.0–36.0)
MCV: 82.4 fL (ref 80.0–100.0)
Platelets: 212 K/uL (ref 150–400)
RBC: 3.7 MIL/uL — ABNORMAL LOW (ref 4.22–5.81)
RDW: 19.9 % — ABNORMAL HIGH (ref 11.5–15.5)
WBC: 4.1 K/uL (ref 4.0–10.5)
nRBC: 0 % (ref 0.0–0.2)

## 2023-11-13 LAB — BASIC METABOLIC PANEL WITH GFR
Anion gap: 11 (ref 5–15)
BUN: 20 mg/dL (ref 8–23)
CO2: 20 mmol/L — ABNORMAL LOW (ref 22–32)
Calcium: 8.6 mg/dL — ABNORMAL LOW (ref 8.9–10.3)
Chloride: 105 mmol/L (ref 98–111)
Creatinine, Ser: 1.34 mg/dL — ABNORMAL HIGH (ref 0.61–1.24)
GFR, Estimated: 52 mL/min — ABNORMAL LOW (ref >60)
Glucose, Bld: 110 mg/dL — ABNORMAL HIGH (ref 70–99)
Potassium: 4 mmol/L (ref 3.5–5.1)
Sodium: 136 mmol/L (ref 135–145)

## 2023-11-13 LAB — GLUCOSE, CAPILLARY
Glucose-Capillary: 109 mg/dL — ABNORMAL HIGH (ref 70–99)
Glucose-Capillary: 91 mg/dL (ref 70–99)
Glucose-Capillary: 90 mg/dL (ref 70–99)

## 2023-11-13 LAB — SURGICAL PATHOLOGY

## 2023-11-13 SURGERY — COLONOSCOPY
Anesthesia: Monitor Anesthesia Care

## 2023-11-13 MED ORDER — SODIUM CHLORIDE 0.9 % IV SOLN: INTRAVENOUS | Status: DC

## 2023-11-13 MED ORDER — PHENYLEPHRINE 80 MCG/ML (10ML) SYRINGE FOR IV PUSH (FOR BLOOD PRESSURE SUPPORT)
PREFILLED_SYRINGE | INTRAVENOUS | Status: DC | PRN
Start: 2023-11-13 — End: 2023-11-13
  Administered 2023-11-13 (×5): 160 ug via INTRAVENOUS

## 2023-11-13 MED ORDER — PROPOFOL 500 MG/50ML IV EMUL
INTRAVENOUS | Status: DC | PRN
  Administered 2023-11-13: 160 ug/kg/min via INTRAVENOUS

## 2023-11-13 NOTE — Anesthesia Preprocedure Evaluation (Signed)
 Anesthesia Evaluation    Reviewed: Allergy & Precautions, Patient's Chart, lab work & pertinent test results  Airway Mallampati: II  TM Distance: >3 FB Neck ROM: Full    Dental no notable dental hx.    Pulmonary sleep apnea and Continuous Positive Airway Pressure Ventilation , COPD, former smoker   Pulmonary exam normal        Cardiovascular hypertension, Pt. on medications pulmonary hypertension+ CAD and +CHF  Normal cardiovascular exam  Severe AS, s/p TAVR  ECHO: 1. Left ventricular ejection fraction, by estimation, is 55 to 60% . The left ventricle has normal function. The left ventricle has no regional wall motion abnormalities. There is mild left ventricular hypertrophy. Left ventricular diastolic parameters are indeterminate. 2. Right ventricular systolic function is mildly reduced. The right ventricular size is normal. There is severely elevated pulmonary artery systolic pressure. The estimated right ventricular systolic pressure is 65. 2 mmHg. 3. Left atrial size was severely dilated. 4. Right atrial size was severely dilated. 5. Tricuspid valve regurgitation is moderate. 6. The aortic valve has been repaired/ replaced. Aortic valve regurgitation is not visualized. There is a 26 mm Edwards Sapien prosthetic ( TAVR) valve present in the aortic position. Vmax 2. 1 m/ s, MG , EOA 1. 1 cm^ 2, DI 0. 48 7. The inferior vena cava is dilated in size with > 50% respiratory variability, suggesting right atrial pressure of 8 mmHg. 8. The mitral valve is abnormal. Moderate to severe mitral valve regurgitation. Mild mitral stenosis. The mean mitral valve gradient is 5. 0 mmHg with average heart rate of 74 bpm. Eccentric posterior directed MR jet, difficult to quantify given eccentric jet, but suspect severe MR. Recommend TEE or cardiac MRI for further evaluation   Neuro/Psych  PSYCHIATRIC DISORDERS Anxiety Depression    negative neurological  ROS     GI/Hepatic Neg liver ROS, hiatal hernia, PUD,GERD  Medicated,,  Endo/Other  diabetes, Oral Hypoglycemic AgentsHypothyroidism    Renal/GU Renal InsufficiencyRenal disease     Musculoskeletal  (+) Arthritis ,    Abdominal   Peds  Hematology  (+) Blood dyscrasia, anemia   Anesthesia Other Findings Anemia GI bleed  Reproductive/Obstetrics                              Anesthesia Physical Anesthesia Plan  ASA: 4  Anesthesia Plan: MAC   Post-op Pain Management:    Induction:   PONV Risk Score and Plan: 1 and Propofol  infusion and Treatment may vary due to age or medical condition  Airway Management Planned: Simple Face Mask  Additional Equipment:   Intra-op Plan:   Post-operative Plan:   Informed Consent: I have reviewed the patients History and Physical, chart, labs and discussed the procedure including the risks, benefits and alternatives for the proposed anesthesia with the patient or authorized representative who has indicated his/her understanding and acceptance.     Dental advisory given  Plan Discussed with: CRNA  Anesthesia Plan Comments:         Anesthesia Quick Evaluation

## 2023-11-13 NOTE — Interval H&P Note (Signed)
 History and Physical Interval Note:  11/13/2023 2:08 PM  [name] White  has presented today for surgery, with the diagnosis of Anemia, GI bleed.  The various methods of treatment have been discussed with the patient and family. After consideration of risks, benefits and other options for treatment, the patient has consented to  Procedure(s): COLONOSCOPY (N/A) as a surgical intervention.  The patient's history has been reviewed, patient examined, no change in status, stable for surgery.  I have reviewed the patient's chart and labs.  Questions were answered to the patient's satisfaction.     Glendia FORBES Holt

## 2023-11-13 NOTE — Discharge Summary (Signed)
 Physician Discharge Summary   Patient: [name] White MRN: [id number] DOB: 1936/03/06  Admit date:     11/11/2023  Discharge date: 11/13/23  Discharge Physician: Lebron JINNY Cage   PCP: Garald Karlynn LULLA, MD   Recommendations at discharge:   Follow-up with PCP with repeat CBC, BMP in 1 week.  Follow-up with BP log  Discharge Diagnoses: Principal Problem:   Melena Active Problems:   Hypothyroidism   Hyperlipidemia   Depression with anxiety   Essential hypertension   GERD   Obstructive sleep apnea   Chronic atrial fibrillation (HCC)   Coronary atherosclerosis of native coronary artery   Primary open-angle glaucoma, bilateral, mild stage   Type 2 diabetes mellitus with hyperglycemia (HCC)   Acute on chronic blood loss anemia   Mitral valve insufficiency   AVM (arteriovenous malformation) of colon with hemorrhage    Hospital Course: [name] White is a 88 y.o. male with medical history significant of normocytic anemia, anxiety, atrial fibrillation/flutter on apixaban , status post ablation, chronic diastolic CHF, CAD, depression, type 2 diabetes, lung emphysema, hypertension, hypothyroidism, severe arctic stenosis status post TAVR,GERD/hiatal hernia, gastritis, duodenal ulcer who presented to the ED with complaints of dizziness, dyspnea and melena over the past 2 weeks.  He received a blood transfusion last week.  In the ED, vital signs fairly stable except for BP with SBP in the 90s. Lab work: CBC showed hemoglobin 7.7 g/dL and platelets 753.  PT 20.6 and INR 1.7.  GI consulted.  Patient admitted for further management.   Today, patient denies any new complaints, saw patient prior to colonoscopy.  Patient very eager to discharge home after colonoscopy.  GI okay for discharge from GI standpoint.  Advised to follow-up with PCP in 1 week with repeat labs.   Assessment and Plan:  Acute on chronic blood loss anemia  Lower GI bleed likely from colonic AVMs Hemoglobin  stable, no further melena GI consulted, s/p EGD which showed possible esophageal candidiasis, biopsied, 4 cm hiatal hernia, previously seen duodenal ulcer/esophagitis have healed.  No suspected bleeding sources identified S/p colonoscopy on 8/14, showed multiple nonbleeding colonic AVMs, treated with APC.  Clips were placed.  Recommend to resume regular diet, resume Eliquis  on 11/15/2023 Continue PPI PCP to repeat CBC in 1 week   Possible CKD stage 2 Follow-up with PCP   Diabetes mellitus type 2 Last A1c 7.3 Continue home regimen   Hypothyroidism Levothyroxine  175 mcg p.o. daily   Hyperlipidemia Continue rosuvastatin  10 mg p.o. daily   Essential hypertension BP noted to be soft Held amlodipine , Lasix , continue Aldactone  12.5 mg and monitor BP closely, if high restart meds, if low continue to hold BP meds   Chronic atrial fibrillation Rate controlled Hold Eliquis , restart on 11/15/2023   Primary open-angle glaucoma, bilateral, mild stage Continue timolol  and Xalatan  drops.   Depression with anxiety Continue paroxetine  30 mg p.o. daily. Continue trazodone  75 mg p.o. bedtime.       Consultants: GI Procedures performed: EGD, colonoscopy Disposition: Home Diet recommendation:  Cardiac and Carb modified diet DISCHARGE MEDICATION: Allergies as of 11/13/2023       Reactions   Ace Inhibitors Anaphylaxis, Swelling   Face and lip swelling   Codeine Itching, Rash   Lotensin  [benazepril ] Anaphylaxis, Swelling   Face and lip swelling   Statins Other (See Comments)   Made very sick, doctor thought had heart attack; my appearance, color, etc. Tolerating a low dose currently   Piroxicam Hives   Simbrinza [  brinzolamide-brimonidine] Other (See Comments)   Burning sensation Eye redness   Sprycel  [dasatinib ] Other (See Comments)   Voice hoarseness  Myopathy   Scrotal swelling   Betadine [povidone Iodine ] Hives, Rash        Medication List     PAUSE taking these  medications    amLODipine  10 MG tablet Wait to take this until your doctor or other care provider tells you to start again. Commonly known as: NORVASC  Take 10 mg by mouth daily.   apixaban  5 MG Tabs tablet Wait to take this until: November 15, 2023 Commonly known as: ELIQUIS  Take 5 mg by mouth in the morning and at bedtime.   furosemide  20 MG tablet Wait to take this until your doctor or other care provider tells you to start again. Commonly known as: LASIX  TAKE 2 TABLETS (40 MG TOTAL) BY MOUTH DAILY.       TAKE these medications    acetaminophen  500 MG tablet Commonly known as: TYLENOL  Take 500 mg by mouth 2 (two) times daily as needed for moderate pain (pain score 4-6) or headache.   carboxymethylcellulose 0.5 % Soln Commonly known as: REFRESH PLUS Place 1 drop into both eyes 4 (four) times daily.   cholecalciferol 25 MCG (1000 UNIT) tablet Commonly known as: VITAMIN D3 Take 1,000 Units by mouth daily.   CINNAMON PO Take 2,000 mg by mouth in the morning and at bedtime.   CoQ10 200 MG Caps Take 200 mg by mouth at bedtime.   empagliflozin  25 MG Tabs tablet Commonly known as: JARDIANCE  Take 12.5 mg by mouth daily.   famotidine  20 MG tablet Commonly known as: PEPCID  Take 20 mg by mouth See admin instructions. Take 1 tablet (20mg ) by mouth twice daily, 1 hour after nilotinib .   finasteride  5 MG tablet Commonly known as: PROSCAR  Take 5 mg by mouth at bedtime.   latanoprost  0.005 % ophthalmic solution Commonly known as: XALATAN  Place 1 drop into both eyes at bedtime.   levothyroxine  175 MCG tablet Commonly known as: SYNTHROID  Take 175 mcg by mouth daily.   loratadine  10 MG tablet Commonly known as: CLARITIN  Take 10 mg by mouth daily.   metFORMIN  500 MG 24 hr tablet Commonly known as: GLUCOPHAGE -XR Take 500 mg by mouth 2 (two) times daily.   mirabegron  ER 50 MG Tb24 tablet Commonly known as: MYRBETRIQ  Take 50 mg by mouth daily.   nilotinib  150 MG  capsule Commonly known as: TASIGNA  Take 1 capsule (150 mg total) by mouth 2 (two) times daily. Take on an empty stomach, 1 hr before or 2 hrs after food.   pantoprazole  40 MG tablet Commonly known as: PROTONIX  TAKE 1 TABLET (40 MG TOTAL) BY MOUTH TWICE A DAY BEFORE MEALS   PARoxetine  30 MG tablet Commonly known as: PAXIL  Take 30 mg by mouth daily.   potassium chloride  SA 20 MEQ tablet Commonly known as: KLOR-CON  M Take 20 mEq by mouth daily.   rosuvastatin  10 MG tablet Commonly known as: CRESTOR  TAKE 1 TABLET (10 MG TOTAL) BY MOUTH DAILY. PLEASE SCHEDULE AN APPOINTMENT FOR FURTHER REFILLS   spironolactone  25 MG tablet Commonly known as: ALDACTONE  Take 12.5 mg by mouth daily.   timolol  0.5 % ophthalmic solution Commonly known as: BETIMOL  Place 1 drop into both eyes daily.   traZODone  50 MG tablet Commonly known as: DESYREL  Take 75 mg by mouth at bedtime.        Follow-up Information     Plotnikov, Karlynn GAILS, MD. Schedule  an appointment as soon as possible for a visit in 1 week(s).   Specialty: Internal Medicine Contact information: [address] [place] [address] [phone]                Discharge Exam: [name] White   11/11/23 0853  Weight: 98 kg   General: NAD  Cardiovascular: S1, S2 present Respiratory: CTAB Abdomen: Soft, nontender, nondistended, bowel sounds present Musculoskeletal: No bilateral pedal edema noted Skin: Normal Psychiatry: Normal mood   Condition at discharge: stable  The results of significant diagnostics from this hospitalization (including imaging, microbiology, ancillary and laboratory) are listed below for reference.   Imaging Studies: No results found.  Microbiology: Results for orders placed or performed during the hospital encounter of 04/09/19  Novel Coronavirus, NAA (Hosp order, Send-out to Ref Lab; TAT 18-24 hrs     Status: None   Collection Time: 04/09/19 12:55 PM   Specimen: Nasopharyngeal Swab;  Respiratory  Result Value Ref Range Status   SARS-CoV-2, NAA NOT DETECTED NOT DETECTED Final    Comment: (NOTE) This nucleic acid amplification test was developed and its performance characteristics determined by [employer]. Nucleic acid amplification tests include PCR and TMA. This test has not been FDA cleared or approved. This test has been authorized by FDA under an Emergency Use Authorization (EUA). This test is only authorized for the duration of time the declaration that circumstances exist justifying the authorization of the emergency use of in vitro diagnostic tests for detection of SARS-CoV-2 virus and/or diagnosis of COVID-19 infection under section 564(b)(1) of the Act, 21 U.S.C. 639aaa-6(a) (1), unless the authorization is terminated or revoked sooner. When diagnostic testing is negative, the possibility of a false negative result should be considered in the context of a patient's recent exposures and the presence of clinical signs and symptoms consistent with COVID-19. An individual without symptoms of COVID- 19 and who is not shedding SARS-CoV-2 vi rus would expect to have a negative (not detected) result in this assay. Performed At: [facility] [address] [place], [place] [id number] Jennette Shorter MD Ey:[id number]    Coronavirus Source NASOPHARYNGEAL  Final    Comment: Performed at [facility], [address], [place], [place] [address]    Labs: CBC: Recent Labs  Lab 11/11/23 [phone] 11/12/23 0414 11/13/23 0403  WBC 4.6 3.8* 4.1  NEUTROABS 3.3  --   --   HGB 7.7* 8.3* 8.8*  HCT 26.9* 28.1* 30.5*  MCV 79.1* 79.8* 82.4  PLT 246 202 212   Basic Metabolic Panel: Recent Labs  Lab 11/11/23 0942 11/12/23 0414 11/13/23 0403  NA 136 137 136  K 4.3 4.0 4.0  CL 100 103 105  CO2 25 26 20*  GLUCOSE 210* 129* 110*  BUN 28* 24* 20  CREATININE 1.48* 1.37* 1.34*  CALCIUM  9.2 8.8* 8.6*  MG 2.4  --   --    Liver Function  Tests: Recent Labs  Lab 11/11/23 0942 11/12/23 0414  AST 21 17  ALT 13 13  ALKPHOS 70 63  BILITOT 0.9 0.8  PROT 7.4 6.9  ALBUMIN 3.5 3.3*   CBG: Recent Labs  Lab 11/12/23 1649 11/12/23 2047 11/13/23 0754 11/13/23 1114 11/13/23 1636  GLUCAP 102* 102* 109* 91 90    Discharge time spent: greater than 30 minutes.  Signed: Lebron JINNY Cage, MD [facility] 11/13/2023

## 2023-11-13 NOTE — Transfer of Care (Signed)
 Immediate Anesthesia Transfer of Care Note  Patient: [name] White  Procedure(s) Performed: COLONOSCOPY  Patient Location: PACU  Anesthesia Type:MAC  Level of Consciousness: sedated  Airway & Oxygen Therapy: Patient Spontanous Breathing  Post-op Assessment: Report given to RN  Post vital signs: Reviewed and stable  Last Vitals:  Vitals Value Taken Time  BP 98/44 11/13/23 14:50  Temp 36.4 C 11/13/23 14:50  Pulse 74 11/13/23 14:52  Resp 17 11/13/23 14:52  SpO2 88 % 11/13/23 14:52  Vitals shown include unfiled device data.  Last Pain:  Vitals:   11/13/23 1450  TempSrc: Temporal  PainSc:          Complications: No notable events documented.

## 2023-11-13 NOTE — Op Note (Signed)
 [facility] Patient Name: [name] White Procedure Date: 11/13/2023 MRN: [id number] Attending MD: Glendia BRAVO. Stacia , MD, [id number] Date of Birth: 1935-09-20 CSN: [id number] Age: 88 Admit Type: Inpatient Procedure:                Colonoscopy Indications:              Iron deficiency anemia secondary to chronic blood                            loss, Iron deficiency anemia Providers:                Glendia E. Stacia, MD, Willy Hummer, RN, Felice Sar, Technician, Corene Southgate, Technician Referring MD:              Medicines:                Monitored Anesthesia Care Complications:            No immediate complications. Estimated Blood Loss:     Estimated blood loss was minimal. Procedure:                Pre-Anesthesia Assessment:                           - Prior to the procedure, a History and Physical                            was performed, and patient medications and                            allergies were reviewed. The patient's tolerance of                            previous anesthesia was also reviewed. The risks                            and benefits of the procedure and the sedation                            options and risks were discussed with the patient.                            All questions were answered, and informed consent                            was obtained. Prior Anticoagulants: The patient has                            taken Eliquis  (apixaban ), last dose was 3 days                            prior to procedure. ASA Grade Assessment: IV - A  patient with severe systemic disease that is a                            constant threat to life. After reviewing the risks                            and benefits, the patient was deemed in                            satisfactory condition to undergo the procedure.                           After obtaining informed consent, the colonoscope                             was passed under direct vision. Throughout the                            procedure, the patient's blood pressure, pulse, and                            oxygen saturations were monitored continuously. The                            [id number]-HQ190L ([id number]) Olympus colonoscope was                            introduced through the anus and advanced to the the                            cecum, identified by appendiceal orifice and                            ileocecal valve. The colonoscopy was performed                            without difficulty. The patient tolerated the                            procedure well. The quality of the bowel                            preparation was good. The ileocecal valve,                            appendiceal orifice, and rectum were photographed.                            The bowel preparation used was SUPREP via split                            dose instruction. Scope In: 2:18:21 PM Scope Out: 2:41:17 PM Scope Withdrawal Time: 0 hours 16 minutes 29 seconds  Total Procedure Duration: 0 hours 22 minutes  56 seconds  Findings:      The perianal and digital rectal examinations were normal. Pertinent       negatives include normal sphincter tone and no palpable rectal lesions.      Multiple (5-10) small to medium angioectasias without bleeding were       found in the cecum. Coagulation for hemostasis using argon plasma was       successful. Estimated blood loss was minimal. To prevent bleeding       ablation on the largest angioectasia, three hemostatic clips were       successfully placed (MR conditional). Clip manufacturer: [employer]. There was no bleeding at the end of the procedure.      Multiple medium-mouthed and small-mouthed diverticula were found in the       sigmoid colon and descending colon.      The exam was otherwise normal throughout the examined colon.      The retroflexed view of the distal rectum and anal  verge was normal and       showed no anal or rectal abnormalities. Impression:               - Multiple non-bleeding colonic angioectasias.                            Treated with argon plasma coagulation (APC). Clips                            (MR conditional) were placed. Clip manufacturer:                            [employer]. This is the source of the                            patient's iron deficiency anemia.                           - Moderate diverticulosis in the sigmoid colon and                            in the descending colon.                           - The distal rectum and anal verge are normal on                            retroflexion view.                           - No specimens collected. Moderate Sedation:      Not Applicable - Patient had care per Anesthesia. Recommendation:           - Return patient to hospital ward for possible                            discharge same day.                           - Resume  regular diet.                           - Resume Eliquis  (apixaban ) at prior dose in 2 days                            (Saturday)                           - Repeat CBC as outpatient in 1 week. Procedure Code(s):        --- Professional ---                           [phone], Colonoscopy, flexible; with control of                            bleeding, any method Diagnosis Code(s):        --- Professional ---                           K55.20, Angiodysplasia of colon without hemorrhage                           D50.0, Iron deficiency anemia secondary to blood                            loss (chronic)                           D50.9, Iron deficiency anemia, unspecified                           K57.30, Diverticulosis of large intestine without                            perforation or abscess without bleeding CPT copyright 2022 American Medical Association. All rights reserved. The codes documented in this report are preliminary and upon coder review  may  be revised to meet current compliance requirements. Jaiquan Temme E. Stacia, MD 11/13/2023 3:03:11 PM This report has been signed electronically. Number of Addenda: 0

## 2023-11-14 ENCOUNTER — Telehealth: Payer: Self-pay | Admitting: *Deleted

## 2023-11-14 DIAGNOSIS — D500 Iron deficiency anemia secondary to blood loss (chronic): Secondary | ICD-10-CM

## 2023-11-14 DIAGNOSIS — K922 Gastrointestinal hemorrhage, unspecified: Secondary | ICD-10-CM

## 2023-11-14 NOTE — Anesthesia Postprocedure Evaluation (Signed)
 Anesthesia Post Note  Patient: [name] White  Procedure(s) Performed: EGD (ESOPHAGOGASTRODUODENOSCOPY)     Patient location during evaluation: Endoscopy Anesthesia Type: MAC Level of consciousness: awake and alert Pain management: pain level controlled Vital Signs Assessment: post-procedure vital signs reviewed and stable Respiratory status: spontaneous breathing, nonlabored ventilation, respiratory function stable and patient connected to nasal cannula oxygen Cardiovascular status: blood pressure returned to baseline and stable Postop Assessment: no apparent nausea or vomiting Anesthetic complications: no   No notable events documented.  Last Vitals:  Vitals:   11/13/23 1510 11/13/23 1549  BP: 105/83 107/75  Pulse: 76 96  Resp: 17 14  Temp:  36.5 C  SpO2: 94% 96%    Last Pain:  Vitals:   11/13/23 1510  TempSrc:   PainSc: 0-No pain                 Lauryl Seyer L Jhania Etherington

## 2023-11-14 NOTE — Telephone Encounter (Signed)
 Orders placed in EPIC for CBC to be completed 11/20/23. Will remind patient to get this completed.

## 2023-11-14 NOTE — Anesthesia Postprocedure Evaluation (Signed)
 Anesthesia Post Note  Patient: [name] White  Procedure(s) Performed: COLONOSCOPY     Patient location during evaluation: Endoscopy Anesthesia Type: MAC Level of consciousness: awake Pain management: pain level controlled Vital Signs Assessment: post-procedure vital signs reviewed and stable Respiratory status: spontaneous breathing, nonlabored ventilation and respiratory function stable Cardiovascular status: blood pressure returned to baseline and stable Postop Assessment: no apparent nausea or vomiting Anesthetic complications: no   No notable events documented.  Last Vitals:  Vitals:   11/13/23 1510 11/13/23 1549  BP: 105/83 107/75  Pulse: 76 96  Resp: 17 14  Temp:  36.5 C  SpO2: 94% 96%    Last Pain:  Vitals:   11/13/23 1510  TempSrc:   PainSc: 0-No pain                 Raheem Kolbe P Tabria Steines

## 2023-11-14 NOTE — Telephone Encounter (Signed)
-----   Message from Glendia FORBES Holt sent at 11/13/2023  5:04 PM EDT ----- Regarding: Repeat CBC following hospitalization Team,  This patient was admitted again to the hospital with symptomatic anemia.  The colonoscopy showed multiple AVMs in the right colon treated with APC.  He is being discharged today.  I would like to repeat a CBC next Thursday, August 21 to make sure his hemoglobin is remaining stable.  Can you please contact him next week and provide instructions for repeat labs next Thursday?  Thanks

## 2023-11-14 NOTE — Transitions of Care (Post Inpatient/ED Visit) (Signed)
 11/14/2023  Name: [name] White MRN: [id number] DOB: [birth year]  Today's TOC FU Call Status: Today's TOC FU Call Status:: Successful TOC FU Call Completed TOC FU Call Complete Date: 11/14/23 Patient's Name and Date of Birth confirmed.  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  My daughter in law is a Engineer, civil (consulting) and she makes sure I have everything I need.  I am independent, and don't really want any more phone calls.) Any questions or concerns?: No  Items Reviewed: Did you receive and understand the discharge instructions provided?: Yes (thoroughly reviewed with patient and his spouse who verbalizes good understanding of same) Medications obtained,verified, and reconciled?: Yes (Medications Reviewed) (Full medication reconciliation/ review completed; no concerns or discrepancies identified; confirmed patient obtained/ is taking all newly Rx'd medications as instructed; self-manages medications and denies questions/ concerns around medications today) EXTENSIVE medication review: patient insists on comparing his pre-hospital admission medication list to his new medication list- medication review required 50 minutes of call time Any new allergies since your discharge?: No Dietary orders reviewed?: Yes Type of Diet Ordered:: Healthy diet- high protein, smart eating Do you have support at home?: Yes People in Home [RPT]: spouse Name of Support/Comfort Primary Source: Reports independent in self-care activities; resides with supportive spouse assists as/ if needed/  indicated  Medications Reviewed Today: Medications Reviewed Today     Reviewed by Ninel Abdella M, RN (Registered Nurse) on 11/14/23 at 1148  Med List Status: <None>   Medication Order Taking? Sig Documenting Provider Last Dose Status Informant  acetaminophen  (TYLENOL ) 500 MG tablet [id number]  Take 500 mg by mouth 2 (two) times daily as needed for moderate pain (pain score 4-6) or headache.  Patient not taking: Reported on 11/14/2023   [provider]  Active Self, Pharmacy Records  amLODipine  (NORVASC ) 10 MG tablet [id number]  Take 10 mg by mouth daily. [provider]  Active Self, Pharmacy Records           Med Note (COFFELL, JON HERO   Tue Nov 11, 2023 12:44 PM) [facility] : [facility] 11/06/23 #90.  apixaban  (ELIQUIS ) 5 MG TABS tablet [id number]  Take 5 mg by mouth in the morning and at bedtime. [provider]  Active Self, Pharmacy Records           Med Note (COFFELL, JON HERO   Tue Nov 11, 2023 12:39 PM) [facility] :LF 09/28/23 #180.  carboxymethylcellulose (REFRESH PLUS) 0.5 % SOLN [id number] Yes Place 1 drop into both eyes 4 (four) times daily. [provider]  Active Self, Pharmacy Records  cholecalciferol (VITAMIN D3) 25 MCG (1000 UNIT) tablet [id number] Yes Take 1,000 Units by mouth daily. [provider]  Active Self, Pharmacy Records  CINNAMON PO [id number] Yes Take 2,000 mg by mouth in the morning and at bedtime. [provider]  Active Self, Pharmacy Records  Coenzyme Q10 (COQ10) 200 MG CAPS [id number] Yes Take 200 mg by mouth at bedtime. [provider]  Active Self, Pharmacy Records  empagliflozin  (JARDIANCE ) 25 MG TABS tablet [id number] Yes Take 12.5 mg by mouth daily. [provider]  Active  Self, Pharmacy Records           Med Note (COFFELL, JON HERO   Tue Nov 11, 2023 12:43 PM) [facility] : LF 10/18/23 #45.  famotidine  (PEPCID ) 20 MG tablet [id number]  Take 20 mg by mouth See admin instructions. Take 1 tablet (20mg ) by mouth twice  daily, 1 hour after nilotinib .  Patient not taking: Reported on 11/14/2023   [provider]  Active Self, Pharmacy Records  finasteride  (PROSCAR ) 5 MG tablet [id number] Yes Take 5 mg by mouth at bedtime. [provider]  Active Self, Pharmacy Records           Med Note (COFFELL, JON HERO   Tue Nov 11, 2023 12:44 PM) [facility] : LF 09/28/23 #90.  furosemide  (LASIX ) 20 MG tablet [id number]  TAKE 2 TABLETS (40 MG TOTAL) BY MOUTH DAILY. Bensimhon, Toribio SAUNDERS, MD  Active Self, Pharmacy Records  latanoprost  (XALATAN ) 0.005 % ophthalmic solution [id number] Yes Place 1 drop into both eyes at bedtime. [provider]  Active Self, Pharmacy Records           Med Note (COFFELL, JON HERO   Tue Nov 11, 2023 12:45 PM) [facility] : LF 11/04/23 #7.61mL.  levothyroxine  (SYNTHROID ) 175 MCG tablet [id number] Yes Take 175 mcg by mouth daily. [provider]  Active Self, Pharmacy Records           Med Note (COFFELL, JON HERO   Tue Nov 11, 2023 12:46 PM) Per [facility] : LF 10/23/23 #90.  loratadine  (CLARITIN ) 10 MG tablet [id number]  Take 10 mg by mouth daily.  Patient not taking: Reported on 11/14/2023   [provider]  Active Self, Pharmacy Records  metFORMIN  (GLUCOPHAGE -XR) 500 MG 24 hr tablet [id number] Yes Take 500 mg by mouth 2 (two) times daily. [provider]  Active Self, Pharmacy Records           Med Note (COFFELL, JON HERO   Tue Nov 11, 2023 12:46 PM) Per [facility] : LF 10/07/23 #180.  mirabegron  ER (MYRBETRIQ ) 50 MG TB24 tablet [id number] Yes Take 50 mg by mouth daily. [provider]  Active Self, Pharmacy Records           Med Note (COFFELL, JON HERO   Tue Nov 11, 2023 12:47 PM) Per [facility] : LF 10/08/23 #90.  nilotinib  (TASIGNA ) 150 MG capsule [id number] Yes Take 1 capsule (150 mg total) by mouth 2 (two) times daily. Take on an empty stomach, 1 hr before or 2 hrs after food. Plotnikov, Aleksei V, MD  Active Self, Pharmacy Records           Med Note [facility], JON HERO   Tue Nov 11, 2023 12:47 PM) Per [facility] : LF 10/23/23 #112.  pantoprazole  (PROTONIX ) 40 MG tablet [id number] Yes TAKE 1 TABLET (40 MG TOTAL) BY MOUTH TWICE A DAY BEFORE MEALS [place], PA-C  Active Self, Pharmacy Records  PARoxetine  (PAXIL ) 30 MG tablet [id number] Yes Take 30 mg by mouth daily. [provider]  Active Self, Pharmacy Records           Med Note (COFFELL, JON HERO   Tue Nov 11, 2023 12:49 PM) Per [facility] : LF 10/23/23 #90.  potassium chloride  SA (KLOR-CON  M) 20 MEQ tablet [id number] Yes Take 20 mEq by mouth daily. [provider]  Active Self, Pharmacy Records  rosuvastatin  (CRESTOR ) 10 MG tablet [id number] Yes TAKE 1 TABLET (10 MG TOTAL) BY MOUTH DAILY. PLEASE SCHEDULE AN APPOINTMENT FOR  FURTHER REFILLS Zenaida Morene PARAS, MD  Active Self, Pharmacy Records  spironolactone  (ALDACTONE ) 25 MG tablet [id number] Yes Take 12.5 mg by mouth daily. [provider]  Active Self, Pharmacy Records           Med Note (COFFELL, JON HERO   Tue Nov 11, 2023 12:48 PM) Per [facility] : LF 10/31/23 #45.  timolol  (BETIMOL ) 0.5 % ophthalmic solution [id number] Yes Place 1 drop into both eyes daily. [provider]  Active Self, Pharmacy Records           Med Note (COFFELL, JON HERO   Tue Nov 11, 2023 12:48 PM) Per [facility] : LF 10/23/23 #46mL.  traZODone  (DESYREL ) 50 MG tablet [id number] Yes Take 75 mg by mouth at bedtime. [provider]  Active Self, Pharmacy Records           Med Note (COFFELL, JON HERO   Tue Nov 11, 2023 12:48 PM) [facility] : LF 10/17/23 #135.           Home Care and Equipment/Supplies: Were Home Health Services Ordered?: No Any new equipment or medical supplies ordered?: No  Functional Questionnaire: Do you need assistance with bathing/showering or dressing?: No Do you need assistance with meal preparation?: Yes (spouse prepares all meals) Do you need assistance with eating?: No Do you have difficulty maintaining continence: No Do you need assistance with getting out  of bed/getting out of a chair/moving?: No Do you have difficulty managing or taking your medications?: No  Follow up appointments reviewed: PCP Follow-up appointment confirmed?: Yes (care coordination outreach in real-time with scheduling care guide to successfully schedule hospital follow up PCP appointment 11/17/23) Date of PCP follow-up appointment?: 11/17/23 Follow-up Provider: PCP- Dr. Garald Specialist [facility] Follow-up appointment confirmed?: Yes Date of Specialist follow-up appointment?: 12/24/23 (verified this is recommended time frame for follow up per hospital discharging provider notes - confirmed GI provider has contacted patient this morning to follow up after hospitalization) Follow-Up Specialty Provider:: GI provider Do you need transportation to your follow-up appointment?: No Do you understand care options if your condition(s) worsen?: Yes-patient verbalized understanding  SDOH Interventions Today    Flowsheet Row Most Recent Value  SDOH Interventions   Food Insecurity Interventions Intervention Not Indicated  Housing Interventions Intervention Not Indicated  Transportation Interventions Intervention Not Indicated  [spouse or other family members provide transportation]  Utilities Interventions Intervention Not Indicated   See TOC assessment tabs for additional assessment/ TOC intervention information  Patient declines need for ongoing/ further care management outreach; declines enrollment in 30-day TOC program; provided my direct contact information should questions/ concerns/ needs arise post-TOC call   Pls call/ message for questions,  Daxson Reffett Mckinney Romanda Turrubiates, RN, BSN, CCRN Alumnus RN Care Manager  Transitions of Care  [facility] [phone]: direct office

## 2023-11-17 ENCOUNTER — Ambulatory Visit: Payer: Self-pay | Admitting: Gastroenterology

## 2023-11-17 ENCOUNTER — Encounter (HOSPITAL_COMMUNITY): Payer: Self-pay | Admitting: Gastroenterology

## 2023-11-17 ENCOUNTER — Ambulatory Visit (INDEPENDENT_AMBULATORY_CARE_PROVIDER_SITE_OTHER): Admitting: Internal Medicine

## 2023-11-17 VITALS — BP 130/81 | HR 73 | Temp 98.0°F | Ht 74.0 in | Wt 219.0 lb

## 2023-11-17 DIAGNOSIS — I10 Essential (primary) hypertension: Secondary | ICD-10-CM | POA: Diagnosis not present

## 2023-11-17 DIAGNOSIS — K209 Esophagitis, unspecified without bleeding: Secondary | ICD-10-CM

## 2023-11-17 DIAGNOSIS — K5521 Angiodysplasia of colon with hemorrhage: Secondary | ICD-10-CM | POA: Diagnosis not present

## 2023-11-17 DIAGNOSIS — N3941 Urge incontinence: Secondary | ICD-10-CM | POA: Insufficient documentation

## 2023-11-17 DIAGNOSIS — D500 Iron deficiency anemia secondary to blood loss (chronic): Secondary | ICD-10-CM

## 2023-11-17 DIAGNOSIS — N401 Enlarged prostate with lower urinary tract symptoms: Secondary | ICD-10-CM | POA: Insufficient documentation

## 2023-11-17 LAB — COMPREHENSIVE METABOLIC PANEL WITH GFR
ALT: 14 U/L (ref 0–53)
AST: 27 U/L (ref 0–37)
Albumin: 4.1 g/dL (ref 3.5–5.2)
Alkaline Phosphatase: 70 U/L (ref 39–117)
BUN: 11 mg/dL (ref 6–23)
CO2: 26 meq/L (ref 19–32)
Calcium: 9.1 mg/dL (ref 8.4–10.5)
Chloride: 102 meq/L (ref 96–112)
Creatinine, Ser: 1.09 mg/dL (ref 0.40–1.50)
GFR: 61.48 mL/min (ref >60.00)
Glucose, Bld: 141 mg/dL — ABNORMAL HIGH (ref 70–99)
Potassium: 3.8 meq/L (ref 3.5–5.1)
Sodium: 137 meq/L (ref 135–145)
Total Bilirubin: 0.5 mg/dL (ref 0.2–1.2)
Total Protein: 7.7 g/dL (ref 6.0–8.3)

## 2023-11-17 LAB — CBC WITH DIFFERENTIAL/PLATELET
Basophils Absolute: 0.1 K/uL (ref 0.0–0.1)
Basophils Relative: 1.8 % (ref 0.0–3.0)
Eosinophils Absolute: 0.1 K/uL (ref 0.0–0.7)
Eosinophils Relative: 2.4 % (ref 0.0–5.0)
HCT: 32.2 % — ABNORMAL LOW (ref 39.0–52.0)
Hemoglobin: 9.8 g/dL — ABNORMAL LOW (ref 13.0–17.0)
Lymphocytes Relative: 23 % (ref 12.0–46.0)
Lymphs Abs: 1 K/uL (ref 0.7–4.0)
MCHC: 30.4 g/dL (ref 30.0–36.0)
MCV: 76.1 fl — ABNORMAL LOW (ref 78.0–100.0)
Monocytes Absolute: 0.4 K/uL (ref 0.1–1.0)
Monocytes Relative: 9.6 % (ref 3.0–12.0)
Neutro Abs: 2.7 K/uL (ref 1.4–7.7)
Neutrophils Relative %: 63.2 % (ref 43.0–77.0)
Platelets: 214 K/uL (ref 150.0–400.0)
RBC: 4.23 Mil/uL (ref 4.22–5.81)
RDW: 21.3 % — ABNORMAL HIGH (ref 11.5–15.5)
WBC: 4.3 K/uL (ref 4.0–10.5)

## 2023-11-17 NOTE — Assessment & Plan Note (Signed)
 He is s/p EGD which showed possible esophageal candidiasis, biopsied, 4 cm hiatal hernia, previously seen duodenal ulcer/esophagitis have healed.  No suspected bleeding sources identified S/p colonoscopy on 8/14, showed multiple nonbleeding colonic AVMs, treated with APC.  Clips were placed.  Recommend to resume regular diet, resumed Eliquis  on 11/15/2023. Check CBC

## 2023-11-17 NOTE — Progress Notes (Signed)
 Subjective:  Patient ID: [name] White, male    DOB: 03/07/1936  Age: 88 y.o. MRN: [id number]  CC: Hospitalization Follow-up   HPI [name] White presents for anemia, GI bleed, HTN w/low BP, A fib. He had 2 units RBCs  Per hx:  Admit date:     11/11/2023  Discharge date: 11/13/23  Discharge Physician: Lebron JINNY Cage    PCP: Garald Karlynn LULLA, MD    Recommendations at discharge:    Follow-up with PCP with repeat CBC, BMP in 1 week.  Follow-up with BP log   Discharge Diagnoses: Principal Problem:   Melena Active Problems:   Hypothyroidism   Hyperlipidemia   Depression with anxiety   Essential hypertension   GERD   Obstructive sleep apnea   Chronic atrial fibrillation (HCC)   Coronary atherosclerosis of native coronary artery   Primary open-angle glaucoma, bilateral, mild stage   Type 2 diabetes mellitus with hyperglycemia (HCC)   Acute on chronic blood loss anemia   Mitral valve insufficiency   AVM (arteriovenous malformation) of colon with hemorrhage       Hospital Course: [name] White is a 88 y.o. male with medical history significant of normocytic anemia, anxiety, atrial fibrillation/flutter on apixaban , status post ablation, chronic diastolic CHF, CAD, depression, type 2 diabetes, lung emphysema, hypertension, hypothyroidism, severe arctic stenosis status post TAVR,GERD/hiatal hernia, gastritis, duodenal ulcer who presented to the ED with complaints of dizziness, dyspnea and melena over the past 2 weeks.  He received a blood transfusion last week.  In the ED, vital signs fairly stable except for BP with SBP in the 90s. Lab work: CBC showed hemoglobin 7.7 g/dL and platelets 753.  PT 20.6 and INR 1.7.  GI consulted.  Patient admitted for further management.     Today, patient denies any new complaints, saw patient prior to colonoscopy.  Patient very eager to discharge home after colonoscopy.  GI okay for discharge from GI standpoint.  Advised to  follow-up with PCP in 1 week with repeat labs.     Assessment and Plan:   Acute on chronic blood loss anemia  Lower GI bleed likely from colonic AVMs Hemoglobin stable, no further melena GI consulted, s/p EGD which showed possible esophageal candidiasis, biopsied, 4 cm hiatal hernia, previously seen duodenal ulcer/esophagitis have healed.  No suspected bleeding sources identified S/p colonoscopy on 8/14, showed multiple nonbleeding colonic AVMs, treated with APC.  Clips were placed.  Recommend to resume regular diet, resume Eliquis  on 11/15/2023 Continue PPI PCP to repeat CBC in 1 week   Possible CKD stage 2 Follow-up with PCP   Diabetes mellitus type 2 Last A1c 7.3 Continue home regimen   Hypothyroidism Levothyroxine  175 mcg p.o. daily   Hyperlipidemia Continue rosuvastatin  10 mg p.o. daily   Essential hypertension BP noted to be soft Held amlodipine , Lasix , continue Aldactone  12.5 mg and monitor BP closely, if high restart meds, if low continue to hold BP meds   Chronic atrial fibrillation Rate controlled Hold Eliquis , restart on 11/15/2023   Primary open-angle glaucoma, bilateral, mild stage Continue timolol  and Xalatan  drops.   Depression with anxiety Continue paroxetine  30 mg p.o. daily. Continue trazodone  75 mg p.o. bedtime.           Consultants: GI Procedures performed: EGD, colonoscopy Disposition: Home  Outpatient Medications Prior to Visit  Medication Sig Dispense Refill   amLODipine  (NORVASC ) 10 MG tablet Take 10 mg by mouth daily.     apixaban  (ELIQUIS )  5 MG TABS tablet Take 5 mg by mouth in the morning and at bedtime.     carboxymethylcellulose (REFRESH PLUS) 0.5 % SOLN Place 1 drop into both eyes 4 (four) times daily.     cholecalciferol (VITAMIN D3) 25 MCG (1000 UNIT) tablet Take 1,000 Units by mouth daily.     CINNAMON PO Take 2,000 mg by mouth in the morning and at bedtime.     Coenzyme Q10 (COQ10) 200 MG CAPS Take 200 mg by mouth at bedtime.      empagliflozin  (JARDIANCE ) 25 MG TABS tablet Take 12.5 mg by mouth daily.     finasteride  (PROSCAR ) 5 MG tablet Take 5 mg by mouth at bedtime.     furosemide  (LASIX ) 20 MG tablet TAKE 2 TABLETS (40 MG TOTAL) BY MOUTH DAILY. 180 tablet 1   latanoprost  (XALATAN ) 0.005 % ophthalmic solution Place 1 drop into both eyes at bedtime.     levothyroxine  (SYNTHROID ) 175 MCG tablet Take 175 mcg by mouth daily.     metFORMIN  (GLUCOPHAGE -XR) 500 MG 24 hr tablet Take 500 mg by mouth 2 (two) times daily.     mirabegron  ER (MYRBETRIQ ) 50 MG TB24 tablet Take 50 mg by mouth daily.     nilotinib  (TASIGNA ) 150 MG capsule Take 1 capsule (150 mg total) by mouth 2 (two) times daily. Take on an empty stomach, 1 hr before or 2 hrs after food. 60 capsule 1   pantoprazole  (PROTONIX ) 40 MG tablet TAKE 1 TABLET (40 MG TOTAL) BY MOUTH TWICE A DAY BEFORE MEALS 180 tablet 0   PARoxetine  (PAXIL ) 30 MG tablet Take 30 mg by mouth daily.     potassium chloride  SA (KLOR-CON  M) 20 MEQ tablet Take 20 mEq by mouth daily.     rosuvastatin  (CRESTOR ) 10 MG tablet TAKE 1 TABLET (10 MG TOTAL) BY MOUTH DAILY. PLEASE SCHEDULE AN APPOINTMENT FOR FURTHER REFILLS 90 tablet 3   spironolactone  (ALDACTONE ) 25 MG tablet Take 12.5 mg by mouth daily.     timolol  (BETIMOL ) 0.5 % ophthalmic solution Place 1 drop into both eyes daily.     traZODone  (DESYREL ) 50 MG tablet Take 75 mg by mouth at bedtime.     acetaminophen  (TYLENOL ) 500 MG tablet Take 500 mg by mouth 2 (two) times daily as needed for moderate pain (pain score 4-6) or headache. (Patient not taking: Reported on 11/17/2023)     famotidine  (PEPCID ) 20 MG tablet Take 20 mg by mouth See admin instructions. Take 1 tablet (20mg ) by mouth twice daily, 1 hour after nilotinib . (Patient not taking: Reported on 11/17/2023)     loratadine  (CLARITIN ) 10 MG tablet Take 10 mg by mouth daily. (Patient not taking: Reported on 11/17/2023)     No facility-administered medications prior to visit.     ROS: Review of Systems  Constitutional:  Positive for fatigue. Negative for appetite change and unexpected weight change.  HENT:  Negative for congestion, nosebleeds, sneezing, sore throat and trouble swallowing.   Eyes:  Negative for itching and visual disturbance.  Respiratory:  Negative for cough.   Cardiovascular:  Negative for chest pain, palpitations and leg swelling.  Gastrointestinal:  Negative for abdominal distention, blood in stool, diarrhea and nausea.  Genitourinary:  Negative for frequency and hematuria.  Musculoskeletal:  Positive for gait problem. Negative for back pain, joint swelling and neck pain.  Skin:  Negative for rash.  Neurological:  Negative for dizziness, tremors, speech difficulty and weakness.  Psychiatric/Behavioral:  Negative for agitation, dysphoric mood and  sleep disturbance. The patient is not nervous/anxious.     Objective:  BP 130/81   Pulse 73   Temp 98 F (36.7 C) (Oral)   Ht 6' 2 (1.88 m)   Wt 219 lb (99.3 kg)   SpO2 98%   BMI 28.12 kg/m   BP Readings from Last 3 Encounters:  11/17/23 130/81  11/13/23 107/75  11/08/23 (!) 99/55    Wt Readings from Last 3 Encounters:  11/17/23 219 lb (99.3 kg)  11/11/23 216 lb 0.8 oz (98 kg)  10/27/23 216 lb (98 kg)    Physical Exam Constitutional:      General: He is not in acute distress.    Appearance: Normal appearance. He is well-developed.     Comments: NAD  Eyes:     Conjunctiva/sclera: Conjunctivae normal.     Pupils: Pupils are equal, round, and reactive to light.  Neck:     Thyroid : No thyromegaly.     Vascular: No JVD.  Cardiovascular:     Rate and Rhythm: Normal rate and regular rhythm.     Heart sounds: Normal heart sounds. No murmur heard.    No friction rub. No gallop.  Pulmonary:     Effort: Pulmonary effort is normal. No respiratory distress.     Breath sounds: Normal breath sounds. No wheezing or rales.  Chest:     Chest wall: No tenderness.  Abdominal:      General: Bowel sounds are normal. There is no distension.     Palpations: Abdomen is soft. There is no mass.     Tenderness: There is no abdominal tenderness. There is no guarding or rebound.  Musculoskeletal:        General: No tenderness. Normal range of motion.     Cervical back: Normal range of motion.  Lymphadenopathy:     Cervical: No cervical adenopathy.  Skin:    General: Skin is warm and dry.     Findings: No rash.  Neurological:     Mental Status: He is alert and oriented to person, place, and time.     Cranial Nerves: No cranial nerve deficit.     Motor: No abnormal muscle tone.     Coordination: Coordination normal.     Gait: Gait normal.     Deep Tendon Reflexes: Reflexes are normal and symmetric.  Psychiatric:        Behavior: Behavior normal.        Thought Content: Thought content normal.        Judgment: Judgment normal.   Using a cane  Lab Results  Component Value Date   WBC 4.1 11/13/2023   HGB 8.8 (L) 11/13/2023   HCT 30.5 (L) 11/13/2023   PLT 212 11/13/2023   GLUCOSE 110 (H) 11/13/2023   CHOL 138 09/24/2023   TRIG 48.0 09/24/2023   HDL 67.30 09/24/2023   LDLDIRECT 141.6 11/12/2010   LDLCALC 62 09/24/2023   ALT 13 11/12/2023   AST 17 11/12/2023   NA 136 11/13/2023   K 4.0 11/13/2023   CL 105 11/13/2023   CREATININE 1.34 (H) 11/13/2023   BUN 20 11/13/2023   CO2 20 (L) 11/13/2023   TSH 0.01 (L) 09/24/2023   PSA 1.42 09/13/2013   INR 1.5 (H) 11/12/2023   HGBA1C 7.3 (H) 09/24/2023   MICROALBUR 5.6 05/26/2015    No results found.  Assessment & Plan:   Problem List Items Addressed This Visit     Anemia   He is s/p EGD which showed  possible esophageal candidiasis, biopsied, 4 cm hiatal hernia, previously seen duodenal ulcer/esophagitis have healed.  No suspected bleeding sources identified S/p colonoscopy on 8/14, showed multiple nonbleeding colonic AVMs, treated with APC.  Clips were placed.  Recommend to resume regular diet, resumed Eliquis   on 11/15/2023. Check CBC Eliquis  was re-started      Relevant Orders   CBC with Differential/Platelet   Comprehensive metabolic panel with GFR   AVM (arteriovenous malformation) of colon with hemorrhage - Primary   He is s/p EGD which showed possible esophageal candidiasis, biopsied, 4 cm hiatal hernia, previously seen duodenal ulcer/esophagitis have healed.  No suspected bleeding sources identified S/p colonoscopy on 8/14, showed multiple nonbleeding colonic AVMs, treated with APC.  Clips were placed.  Recommend to resume regular diet, resumed Eliquis  on 11/15/2023. Check CBC      Relevant Orders   CBC with Differential/Platelet   Comprehensive metabolic panel with GFR   Esophagitis   He is s/p EGD which showed possible esophageal candidiasis, biopsied, 4 cm hiatal hernia, previously seen duodenal ulcer/esophagitis have healed.  No suspected bleeding sources identified. Bx is pending - Dr Stacia will treat if needed      Essential hypertension (Chronic)   Low BP due to GI bleed: Held amlodipine  (await labs), Lasix , continue Aldactone  12.5 mg and monitor BP closely, if high restart meds, if low continue to hold BP meds Check BP at home      Relevant Orders   CBC with Differential/Platelet   Comprehensive metabolic panel with GFR      No orders of the defined types were placed in this encounter.     Follow-up: Return in about 2 months (around 01/17/2024) for a follow-up visit.  Marolyn Noel, MD

## 2023-11-17 NOTE — Assessment & Plan Note (Signed)
 He is s/p EGD which showed possible esophageal candidiasis, biopsied, 4 cm hiatal hernia, previously seen duodenal ulcer/esophagitis have healed.  No suspected bleeding sources identified. Bx is pending - Dr Stacia will treat if needed

## 2023-11-17 NOTE — Assessment & Plan Note (Signed)
 He is s/p EGD which showed possible esophageal candidiasis, biopsied, 4 cm hiatal hernia, previously seen duodenal ulcer/esophagitis have healed.  No suspected bleeding sources identified S/p colonoscopy on 8/14, showed multiple nonbleeding colonic AVMs, treated with APC.  Clips were placed.  Recommend to resume regular diet, resumed Eliquis  on 11/15/2023. Check CBC Eliquis  was re-started

## 2023-11-17 NOTE — Progress Notes (Signed)
 [name],  The biopsies of your esophagus were unremarkable.  There was no evidence of fungal infection (candidiasis).  No further treatment or follow up is needed for this.

## 2023-11-17 NOTE — Telephone Encounter (Signed)
 Inbound call from patient requesting a call regarding previous note. Please advise, thank you.

## 2023-11-17 NOTE — Assessment & Plan Note (Signed)
 Low BP due to GI bleed: Held amlodipine  (await labs), Lasix , continue Aldactone  12.5 mg and monitor BP closely, if high restart meds, if low continue to hold BP meds Check BP at home

## 2023-11-18 ENCOUNTER — Telehealth: Payer: Self-pay | Admitting: *Deleted

## 2023-11-18 NOTE — Telephone Encounter (Addendum)
 Patient left VM: feels great after having endoscopy and colonoscopy. Things are returning to normal. Doesn't think he will need to be coming back for blood. Expressed appreciation for all of Dr. Lafonda care. Dr. Federico informed.  Per Dr. Federico, patient should have CBC post procedure (8/14), Thursday of this week.   TCT to patient with Dr. Lafonda message and to find out when he wants to come for la on Thursday. Patient reports he had CBC yesterday at Dr. Alexandra office and it looks so much better that he doesn't think he needs another CBC this week.  He does not have any follow up appts scheduled here. He asked if he needed to keep coming here? 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 He prefers to keep his visits all together at the [facility].   Dr. Federico informed - per Dr. Federico if patient wants to consolidate services at [facility] for his [facility], that is ok - please let patient know he is still a patient here and can be seen here if he chooses to make appt.  Patient informed and verbalized understanding. Stated he was ok with seeing his oncologist at the [facility]. Expressed appreciation for Dr. Federico and care received while at [facility]. Encouraged to contact this office in future for any questions or concerns.

## 2023-11-19 ENCOUNTER — Ambulatory Visit: Payer: Self-pay | Admitting: Internal Medicine

## 2023-11-21 ENCOUNTER — Telehealth (HOSPITAL_COMMUNITY): Payer: Self-pay | Admitting: *Deleted

## 2023-11-21 ENCOUNTER — Ambulatory Visit (HOSPITAL_COMMUNITY)
Admission: RE | Admit: 2023-11-21 | Discharge: 2023-11-21 | Disposition: A | Discharge status: Home / Self Care | Source: Ambulatory Visit | Attending: Cardiology | Admitting: Cardiology

## 2023-11-21 DIAGNOSIS — I081 Rheumatic disorders of both mitral and tricuspid valves: Secondary | ICD-10-CM | POA: Insufficient documentation

## 2023-11-21 DIAGNOSIS — I5032 Chronic diastolic (congestive) heart failure: Secondary | ICD-10-CM | POA: Insufficient documentation

## 2023-11-21 DIAGNOSIS — E785 Hyperlipidemia, unspecified: Secondary | ICD-10-CM | POA: Insufficient documentation

## 2023-11-21 DIAGNOSIS — I110 Hypertensive heart disease with heart failure: Secondary | ICD-10-CM | POA: Insufficient documentation

## 2023-11-21 DIAGNOSIS — G4730 Sleep apnea, unspecified: Secondary | ICD-10-CM | POA: Diagnosis not present

## 2023-11-21 LAB — ECHOCARDIOGRAM COMPLETE
MV M vel: 560 m/s
MV Peak grad: 1254400 mmHg
MV VTI: 0.31 cm2
S' Lateral: 3.43 cm

## 2023-11-21 MED ORDER — FUROSEMIDE 20 MG PO TABS: 40.0000 mg | ORAL_TABLET | Freq: Two times a day (BID) | ORAL | Status: DC

## 2023-11-21 NOTE — Telephone Encounter (Signed)
 Pt in for echo today and c/o increased SOB, he reports it awoke him at 5 am today and he had to get into the recliner to breath better. He states it has been progressively getting worse over past week or so. Pt was recently in hosp for GI bleed and received several units of blood and iron, saw PCP earlier this week and labs were stable (hgb 9.8)  Discussed with Dr Zenaida who reviewed echo done today and spoke with pt regarding results, sev MR, recommends increasing Lasix  to 40 mg BID, ref to Dr Wonda, and f/u with him next week. Pt aware and agreeable, appt sch for Tue 8/26, ref placed, med list updated. Pt aware and agreeable to call office back with any other issues/concerns

## 2023-11-21 NOTE — Telephone Encounter (Signed)
 Abran Norleen SAILOR, MD  Claudene Naomie SAILOR, RN Cc: Stacia Glendia BRAVO, MD Improved hemoglobin noted. In-hospital GI intervention as noted. Continue monitoring with hematology. GI follow-up as needed     ----- Message ----- From: Stacia Glendia BRAVO, MD Sent: 11/21/2023   5:48 AM EDT To: Norleen SAILOR Abran, MD; Naomie SAILOR Claudene, RN  Defer to Dr. Abran who he was assigned to at his initial office visit. ----- Message ----- From: Claudene Naomie SAILOR, RN Sent: 11/18/2023   8:44 AM EDT To: Glendia BRAVO Stacia, MD  Patient was to come to our lab 8/21 for CBC follow up. However, looks like Dr Garald ordered CBC yesterday. Please review and advise of any recommendations you may have.

## 2023-11-25 ENCOUNTER — Ambulatory Visit (HOSPITAL_COMMUNITY)
Admission: RE | Admit: 2023-11-25 | Discharge: 2023-11-25 | Disposition: A | Discharge status: Home / Self Care | Source: Ambulatory Visit | Attending: Cardiology | Admitting: Cardiology

## 2023-11-25 ENCOUNTER — Ambulatory Visit (HOSPITAL_COMMUNITY): Payer: Self-pay | Admitting: Cardiology

## 2023-11-25 ENCOUNTER — Encounter (HOSPITAL_COMMUNITY): Payer: Self-pay | Admitting: Cardiology

## 2023-11-25 ENCOUNTER — Other Ambulatory Visit (HOSPITAL_COMMUNITY): Payer: Self-pay | Admitting: Internal Medicine

## 2023-11-25 VITALS — BP 124/80 | HR 74

## 2023-11-25 DIAGNOSIS — I5032 Chronic diastolic (congestive) heart failure: Secondary | ICD-10-CM | POA: Insufficient documentation

## 2023-11-25 LAB — BASIC METABOLIC PANEL WITH GFR
Anion gap: 10 (ref 5–15)
BUN: 22 mg/dL (ref 8–23)
CO2: 27 mmol/L (ref 22–32)
Calcium: 9.3 mg/dL (ref 8.9–10.3)
Chloride: 97 mmol/L — ABNORMAL LOW (ref 98–111)
Creatinine, Ser: 1.26 mg/dL — ABNORMAL HIGH (ref 0.61–1.24)
GFR, Estimated: 56 mL/min — ABNORMAL LOW (ref >60)
Glucose, Bld: 155 mg/dL — ABNORMAL HIGH (ref 70–99)
Potassium: 4 mmol/L (ref 3.5–5.1)
Sodium: 134 mmol/L — ABNORMAL LOW (ref 135–145)

## 2023-11-25 LAB — BRAIN NATRIURETIC PEPTIDE: B Natriuretic Peptide: 453.8 pg/mL — ABNORMAL HIGH (ref 0.0–100.0)

## 2023-11-25 MED ORDER — FUROSEMIDE 20 MG PO TABS: 40.0000 mg | ORAL_TABLET | Freq: Every day | ORAL | 3 refills | Status: AC

## 2023-11-25 MED ORDER — SPIRONOLACTONE 25 MG PO TABS: 25.0000 mg | ORAL_TABLET | Freq: Every day | ORAL | 11 refills | Status: AC

## 2023-11-25 NOTE — Progress Notes (Signed)
 ReDS Vest / Clip - 11/25/23 0900       ReDS Vest / Clip   Station Marker C    Ruler Value 30    ReDS Value Range Moderate volume overload    ReDS Actual Value 37

## 2023-11-25 NOTE — Patient Instructions (Signed)
 INCREASE Spironolactone  to 25 mg daily.  TAKE 40 mg lasix  Twice daily for 1 week, then change to 40 mg daily.  Labs done today, your results will be available in MyChart, we will contact you for abnormal readings.  Call Dr. Coppers office and cancel your appointment with him.  Your physician recommends that you schedule a follow-up appointment in: 3 months (November) ** PLEASE CALL THE OFFICE IN OCTOBER TO ARRANGE YOUR FOLLOW UP APPOINTMENT.**  If you have any questions or concerns before your next appointment please send us  a message through [place] or call our office at [phone].    TO LEAVE A MESSAGE FOR THE NURSE SELECT OPTION 2, PLEASE LEAVE A MESSAGE INCLUDING: YOUR NAME DATE OF BIRTH CALL BACK NUMBER REASON FOR CALL**this is important as we prioritize the call backs  YOU WILL RECEIVE A CALL BACK THE SAME DAY AS LONG AS YOU CALL BEFORE 4:00 PM  At the [facility], you and your health needs are our priority. As part of our continuing mission to provide you with exceptional heart care, we have created designated Provider Care Teams. These Care Teams include your primary Cardiologist (physician) and Advanced Practice Providers (APPs- Physician Assistants and Nurse Practitioners) who all work together to provide you with the care you need, when you need it.   You may see any of the following providers on your designated Care Team at your next follow up: Dr Toribio Fuel Dr Ezra Shuck Dr. Ria Commander Dr. [name] Amy Lenetta, NP Caffie Shed, [place] [facility] [place], [place] Beckey Coe, NP [place] Lee, NP Ellouise Class, NP Tinnie Redman, PharmD Jaun Bash, PharmD   Please be sure to bring in all your medications bottles to every appointment.    Thank you for choosing [facility]

## 2023-11-29 NOTE — Progress Notes (Signed)
 ADVANCED HEART FAILURE FOLLOW UP CLINIC NOTE  Referring Physician: [name], MD  Primary Care: [name], MD Primary Cardiologist:  HPI: [name] White is a 88 y.o. male with a past medical history of nonobstructive CAD, hypertension, hyperlipidemia, obesity, sleep apnea, severe aortic stenosis s/p TAVR 04/2019, atrial flutter s/p ablation who presents for follow up of chronic diastolic heart failure.      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 Being treated at [facility]. Has lost over 40 pounds. BP down so clonidine  stopped.    Echo 10/20 EF 55-60% RV ok. Severe AS mean gradient Small effusion. Mild to mod MR/TR. S/p TAVR on 04/13/19.  Echo 05/17/19 EF 60-65%. TAVR ok. RV ok. Mod TR   Echo 01/17/21: EF 60-65% TAVR stable. Severe biatrial enlargement.   Underwent TEE/RHC for worsening mitral regurgitation noted on echocardiogram. Evidence of severe MR by imaging and RHC. Symptoms minimal, and given significant posterior calcification with small leaflet elected to hold off at this time     SUBJECTIVE:  Recently was hospitalized for GI bleed, at that time his diuretics were all held. He was seen last week after his echocardiogram with significant orthopnea and PND, much more symptomatic. We had arranged follow up with structural and restarted his lasix .  He now feels significantly better, he is breathing more normally and notes that his orthopnea has almost completely resolved. He reports that he is getting around better, overall feeling well. We discussed holding off on further structural evaluation.   PMH, current medications, allergies, social history, and family history reviewed in epic.  PHYSICAL EXAM: Vitals:   11/25/23 0914  BP: 124/80  Pulse: 74  SpO2: 99%     GENERAL: NAD, well appearing PULM:  Normal work of breathing, CTAB CARDIAC:  JVP: mildly elevated         Normal rate with regular rhythm. 3/6 blowing systolic murmur at the apex, soft  systolic murmur at the RUSB. Trace edema. Warm and well perfused extremities. ABDOMEN: Soft, non-tender, non-distended. NEUROLOGIC: Patient is oriented x3 with no focal or lateralizing neurologic deficits.    DATA REVIEW   ECHO: Echo 10/20 EF 55-60% RV ok. Severe AS mean gradient Small effusion. Mild to mod MR/TR. S/p TAVR on 04/13/19.  Echo 05/17/19 EF 60-65%. TAVR ok. RV ok. Mod TR   Echo 01/17/21: EF 60-65% TAVR stable. Severe biatrial enlargement.   03/2023: Normal ejection fraction, appropriately performing bioprosthetic aortic valve, worsening pulmonary artery pressures and likely new severe mitral regurgitation, eccentric jet that is not clearly seen on previous echocardiogram  CATH: R/L cath 12/20 Prox RCA to Mid RCA lesion is 30% stenosed. RPAV lesion is 30% stenosed. Ost Cx to Prox Cx lesion is 50% stenosed. Prox LAD to Mid LAD lesion is 20% stenosed  RA: 5, PA: 37/13 (22): PCWP 14, Fick cardiac output 9.8, index 4.3 but no evidence of shunt  06/19/23: RA18, PA 46/19 (31), PCWP 21 with v waves to 34, Fick CO/CI 7.32/3.2, mild nonobstructive disease  ReDs reading: 37 %, abnormal, mildly overloaded    ASSESSMENT & PLAN:  Mitral regurgitation: Severe MR, workup including TEE and RHC. Discussed with structural team, given minimal symptoms and calfied leaflet they had elected to hold off. Symptoms much improved after restarted diuresis, REDs still mildly elevated. - Continue BID lasix  40mg  for 1 more week than back to daily -TEE and structural recs reviewed with patient -Holding off on intervention, cancel follow up after discussion - Repeat  echo reviewed  Chronic diastolic heart failure: Preserved ejection fraction in the setting of severe MR.   -Continue jardiance  25mg  daily, spironolactone  25mg  daliy -Continue Lasix  as above  Severe AS: TAVR 1/21.  Well-functioning valve on echocardiogram, will need SBE prophylaxis. -Yearly echoes for monitoring  Hypertension:  Hypotensive today, adjustment as above.  CAD - No current s/s ischemia.  - cath 05/2023 with stable non-obstructive CAD - Continue Crestor , Not on ASA due to Eliquis . Not on BB with bradycardia   CML - Following with [facility]. Vertell [facility]ast at [facility] - On nilotinib    Atrial fibrillation, permanent - Tikosyn  stopped 5/17 due to persistent AF. - S/p successful cardioversion 03/2023, unfortunately back in afib - Continue Eliquis  5 BID   Iron deficiency anemia: Noted recently, iron deficient despite iron infusion. Have discussed Watchman in the past, would like to hold off.    Carotid Stenosis - Carotid duplex 12/2013; stable 0-39% bilateral ICA stenosis - Continue Crestor  - F/u as needed   DM2 - Continue Jardiance  25mg  daily   CKD 3b -Repeat labs and BNP today    [name], MD [facility] Mechanical Circulatory Support 11/29/23

## 2023-12-04 ENCOUNTER — Ambulatory Visit: Admitting: Cardiovascular Disease

## 2023-12-11 ENCOUNTER — Other Ambulatory Visit (HOSPITAL_COMMUNITY): Payer: Self-pay | Admitting: Internal Medicine

## 2023-12-24 ENCOUNTER — Ambulatory Visit: Admitting: Physician Assistant

## 2024-01-22 ENCOUNTER — Encounter: Payer: Self-pay | Admitting: Internal Medicine

## 2024-01-22 ENCOUNTER — Ambulatory Visit: Admitting: Internal Medicine

## 2024-01-22 VITALS — BP 104/76 | HR 79 | Temp 98.2°F | Ht 74.0 in | Wt 213.2 lb

## 2024-01-22 DIAGNOSIS — K5521 Angiodysplasia of colon with hemorrhage: Secondary | ICD-10-CM

## 2024-01-22 DIAGNOSIS — I482 Chronic atrial fibrillation, unspecified: Secondary | ICD-10-CM | POA: Diagnosis not present

## 2024-01-22 DIAGNOSIS — C9210 Chronic myeloid leukemia, BCR/ABL-positive, not having achieved remission: Secondary | ICD-10-CM

## 2024-01-22 DIAGNOSIS — Z66 Do not resuscitate: Secondary | ICD-10-CM | POA: Diagnosis not present

## 2024-01-22 DIAGNOSIS — D500 Iron deficiency anemia secondary to blood loss (chronic): Secondary | ICD-10-CM

## 2024-01-22 NOTE — Assessment & Plan Note (Signed)
No recent bleeding

## 2024-01-22 NOTE — Progress Notes (Addendum)
 Subjective:  Patient ID: [name] White, male    DOB: 1935/11/17  Age: 88 y.o. MRN: [id number]  CC: Medical Management of Chronic Issues (3 Month follow up)   HPI [name] White presents for anemia - on IV iron, fatigue, GI bleed, CHF Labs w/Hem/Onc  Outpatient Medications Prior to Visit  Medication Sig Dispense Refill   apixaban  (ELIQUIS ) 5 MG TABS tablet Take 5 mg by mouth in the morning and at bedtime.     carboxymethylcellulose (REFRESH PLUS) 0.5 % SOLN Place 1 drop into both eyes 4 (four) times daily.     cholecalciferol (VITAMIN D3) 25 MCG (1000 UNIT) tablet Take 1,000 Units by mouth daily.     CINNAMON PO Take 2,000 mg by mouth in the morning and at bedtime.     Coenzyme Q10 (COQ10) 200 MG CAPS Take 200 mg by mouth at bedtime.     empagliflozin  (JARDIANCE ) 25 MG TABS tablet Take 12.5 mg by mouth daily.     finasteride  (PROSCAR ) 5 MG tablet Take 5 mg by mouth at bedtime.     furosemide  (LASIX ) 20 MG tablet Take 2 tablets (40 mg total) by mouth daily. 90 tablet 3   latanoprost  (XALATAN ) 0.005 % ophthalmic solution Place 1 drop into both eyes at bedtime.     levothyroxine  (SYNTHROID ) 175 MCG tablet Take 175 mcg by mouth daily.     loratadine  (CLARITIN ) 10 MG tablet Take 10 mg by mouth daily.     metFORMIN  (GLUCOPHAGE -XR) 500 MG 24 hr tablet Take 500 mg by mouth 2 (two) times daily.     mirabegron  ER (MYRBETRIQ ) 50 MG TB24 tablet Take 50 mg by mouth daily.     nilotinib  (TASIGNA ) 150 MG capsule Take 1 capsule (150 mg total) by mouth 2 (two) times daily. Take on an empty stomach, 1 hr before or 2 hrs after food. 60 capsule 1   pantoprazole  (PROTONIX ) 40 MG tablet TAKE 1 TABLET (40 MG TOTAL) BY MOUTH TWICE A DAY BEFORE MEALS 180 tablet 0   PARoxetine  (PAXIL ) 30 MG tablet Take 30 mg by mouth daily.     potassium chloride  SA (KLOR-CON  M) 20 MEQ tablet TAKE 1 TABLET BY MOUTH EVERY DAY 90 tablet 1   rosuvastatin  (CRESTOR ) 10 MG tablet TAKE 1 TABLET (10 MG TOTAL) BY MOUTH DAILY. PLEASE  SCHEDULE AN APPOINTMENT FOR FURTHER REFILLS 90 tablet 3   spironolactone  (ALDACTONE ) 25 MG tablet Take 1 tablet (25 mg total) by mouth daily. 30 tablet 11   timolol  (BETIMOL ) 0.5 % ophthalmic solution Place 1 drop into both eyes daily.     traZODone  (DESYREL ) 50 MG tablet Take 75 mg by mouth at bedtime.     No facility-administered medications prior to visit.    ROS: Review of Systems  Constitutional:  Positive for fatigue. Negative for appetite change and unexpected weight change.  HENT:  Negative for congestion, nosebleeds, sneezing, sore throat and trouble swallowing.   Eyes:  Negative for itching and visual disturbance.  Respiratory:  Negative for cough.   Cardiovascular:  Negative for chest pain, palpitations and leg swelling.  Gastrointestinal:  Negative for abdominal distention, blood in stool, diarrhea and nausea.  Genitourinary:  Negative for frequency and hematuria.  Musculoskeletal:  Positive for gait problem. Negative for back pain, joint swelling and neck pain.  Skin:  Negative for rash.  Neurological:  Positive for weakness. Negative for dizziness, tremors and speech difficulty.  Psychiatric/Behavioral:  Negative for agitation, dysphoric mood, sleep disturbance  and suicidal ideas. The patient is not nervous/anxious.     Objective:  BP 104/76   Pulse 79   Temp 98.2 F (36.8 C)   Ht 6' 2 (1.88 m)   Wt 213 lb 3.2 oz (96.7 kg)   BMI 27.37 kg/m   BP Readings from Last 3 Encounters:  01/22/24 104/76  11/25/23 124/80  11/17/23 130/81    Wt Readings from Last 3 Encounters:  01/22/24 213 lb 3.2 oz (96.7 kg)  11/17/23 219 lb (99.3 kg)  11/11/23 216 lb 0.8 oz (98 kg)    Physical Exam Constitutional:      General: He is not in acute distress.    Appearance: Normal appearance. He is well-developed.     Comments: NAD  Eyes:     Conjunctiva/sclera: Conjunctivae normal.     Pupils: Pupils are equal, round, and reactive to light.  Neck:     Thyroid : No thyromegaly.      Vascular: No JVD.  Cardiovascular:     Rate and Rhythm: Normal rate. Rhythm irregular.     Heart sounds: Normal heart sounds. No murmur heard.    No friction rub. No gallop.  Pulmonary:     Effort: Pulmonary effort is normal. No respiratory distress.     Breath sounds: Normal breath sounds. No wheezing or rales.  Chest:     Chest wall: No tenderness.  Abdominal:     General: Bowel sounds are normal. There is no distension.     Palpations: Abdomen is soft. There is no mass.     Tenderness: There is no abdominal tenderness. There is no guarding or rebound.  Musculoskeletal:        General: No tenderness. Normal range of motion.     Cervical back: Normal range of motion.     Right lower leg: No edema.     Left lower leg: No edema.  Lymphadenopathy:     Cervical: No cervical adenopathy.  Skin:    General: Skin is warm and dry.     Findings: No rash.  Neurological:     Mental Status: He is alert and oriented to person, place, and time.     Cranial Nerves: No cranial nerve deficit.     Motor: No abnormal muscle tone.     Coordination: Coordination normal.     Gait: Gait abnormal.     Deep Tendon Reflexes: Reflexes are normal and symmetric.  Psychiatric:        Behavior: Behavior normal.        Thought Content: Thought content normal.        Judgment: Judgment normal.   Using a cane  Lab Results  Component Value Date   WBC 4.3 11/17/2023   HGB 9.8 (L) 11/17/2023   HCT 32.2 (L) 11/17/2023   PLT 214.0 11/17/2023   GLUCOSE 155 (H) 11/25/2023   CHOL 138 09/24/2023   TRIG 48.0 09/24/2023   HDL 67.30 09/24/2023   LDLDIRECT 141.6 11/12/2010   LDLCALC 62 09/24/2023   ALT 14 11/17/2023   AST 27 11/17/2023   NA 134 (L) 11/25/2023   K 4.0 11/25/2023   CL 97 (L) 11/25/2023   CREATININE 1.26 (H) 11/25/2023   BUN 22 11/25/2023   CO2 27 11/25/2023   TSH 0.01 (L) 09/24/2023   PSA 1.42 09/13/2013   INR 1.5 (H) 11/12/2023   HGBA1C 7.3 (H) 09/24/2023   MICROALBUR 5.6  05/26/2015    No results found.  Assessment & Plan:   Problem  List Items Addressed This Visit     Anemia   On IV iron at the [facility] Using a Lucky iron fish      AVM (arteriovenous malformation) of colon with hemorrhage - Primary   No recent bleeding      Chronic atrial fibrillation (HCC)   Cont on Eliquis       CML (chronic myelocytic leukemia) (HCC)    [facility] Kennedy Off Dasatinib  po - swollen scrotum. On Tasigna  Ask Dr Kennedy about vaccinations, incl flu      Do not intubate, perform CPR, or defibrillate   Discussed. Waive for surgeries prn         No orders of the defined types were placed in this encounter.     Follow-up: Return in about 4 months (around 05/24/2024) for a follow-up visit.  Marolyn Noel, MD

## 2024-01-22 NOTE — Assessment & Plan Note (Signed)
 On IV iron at the [facility] Using a Lucky iron fish

## 2024-01-22 NOTE — Assessment & Plan Note (Signed)
 [facility] Bloomer Off Dasatinib  po - swollen scrotum. On Tasigna  Ask Dr Kennedy about vaccinations, incl flu

## 2024-01-22 NOTE — Assessment & Plan Note (Signed)
Cont on Eliquis- 

## 2024-01-22 NOTE — Assessment & Plan Note (Signed)
 Discussed. Waive for surgeries prn

## 2024-01-28 ENCOUNTER — Ambulatory Visit (INDEPENDENT_AMBULATORY_CARE_PROVIDER_SITE_OTHER)

## 2024-01-28 VITALS — Ht 73.0 in | Wt 213.0 lb

## 2024-01-28 DIAGNOSIS — Z0000 Encounter for general adult medical examination without abnormal findings: Secondary | ICD-10-CM | POA: Diagnosis not present

## 2024-01-28 NOTE — Progress Notes (Addendum)
 Subjective:   [name] White is a 88 y.o. who presents for a Medicare Wellness preventive visit.  As a reminder, Annual Wellness Visits don't include a physical exam, and some assessments may be limited, especially if this visit is performed virtually. We may recommend an in-person follow-up visit with your provider if needed.  Visit Complete: Virtual I connected with  [name] White on 01/28/24 by a audio enabled telemedicine application and verified that I am speaking with the correct person using two identifiers.  Patient Location: Home  Provider Location: Office/Clinic  I discussed the limitations of evaluation and management by telemedicine. The patient expressed understanding and agreed to proceed.  Vital Signs: Because this visit was a virtual/telehealth visit, some criteria may be missing or patient reported. Any vitals not documented were not able to be obtained and vitals that have been documented are patient reported.  VideoDeclined- This patient declined Librarian, academic. Therefore the visit was completed with audio only.  Persons Participating in Visit: Patient.  AWV Questionnaire: No: Patient Medicare AWV questionnaire was not completed prior to this visit.  Cardiac Risk Factors include: advanced age (>38men, >60 women);diabetes mellitus;dyslipidemia;hypertension;male gender     Objective:    Today's Vitals   01/28/24 1310  Weight: 213 lb (96.6 kg)  Height: 6' 1 (1.854 m)   Body mass index is 28.1 kg/m.     01/28/2024    1:12 PM 11/11/2023    3:30 PM 11/11/2023    8:55 AM 09/05/2023    8:30 AM 09/02/2023    8:13 PM 09/02/2023    6:36 PM 06/19/2023    8:37 AM  Advanced Directives  Does Patient Have a Medical Advance Directive? Yes Yes No Yes Yes No Yes  Type of Estate Agent of [place];Living will Healthcare Power of [place];Living will  Living will;Healthcare Power of Attorney Living will;Healthcare Power  of [employer] Power of [place];Living will  Does patient want to make changes to medical advance directive?  No - Patient declined       Copy of Healthcare Power of Attorney in Chart? No - copy requested No - copy requested  No - copy requested     Would patient like information on creating a medical advance directive?  No - Patient declined No - Patient declined        Current Medications (verified) Outpatient Encounter Medications as of 01/28/2024  Medication Sig   apixaban  (ELIQUIS ) 5 MG TABS tablet Take 5 mg by mouth in the morning and at bedtime.   carboxymethylcellulose (REFRESH PLUS) 0.5 % SOLN Place 1 drop into both eyes 4 (four) times daily.   cholecalciferol (VITAMIN D3) 25 MCG (1000 UNIT) tablet Take 1,000 Units by mouth daily.   CINNAMON PO Take 2,000 mg by mouth in the morning and at bedtime.   Coenzyme Q10 (COQ10) 200 MG CAPS Take 200 mg by mouth at bedtime.   empagliflozin  (JARDIANCE ) 25 MG TABS tablet Take 12.5 mg by mouth daily.   finasteride  (PROSCAR ) 5 MG tablet Take 5 mg by mouth at bedtime.   furosemide  (LASIX ) 20 MG tablet Take 2 tablets (40 mg total) by mouth daily.   latanoprost  (XALATAN ) 0.005 % ophthalmic solution Place 1 drop into both eyes at bedtime.   levothyroxine  (SYNTHROID ) 175 MCG tablet Take 175 mcg by mouth daily.   loratadine  (CLARITIN ) 10 MG tablet Take 10 mg by mouth daily.   metFORMIN  (GLUCOPHAGE -XR) 500 MG 24 hr tablet Take  500 mg by mouth 2 (two) times daily.   mirabegron  ER (MYRBETRIQ ) 50 MG TB24 tablet Take 50 mg by mouth daily.   nilotinib  (TASIGNA ) 150 MG capsule Take 1 capsule (150 mg total) by mouth 2 (two) times daily. Take on an empty stomach, 1 hr before or 2 hrs after food.   pantoprazole  (PROTONIX ) 40 MG tablet TAKE 1 TABLET (40 MG TOTAL) BY MOUTH TWICE A DAY BEFORE MEALS   PARoxetine  (PAXIL ) 30 MG tablet Take 30 mg by mouth daily.   potassium chloride  SA (KLOR-CON  M) 20 MEQ tablet TAKE 1 TABLET BY MOUTH EVERY DAY    rosuvastatin  (CRESTOR ) 10 MG tablet TAKE 1 TABLET (10 MG TOTAL) BY MOUTH DAILY. PLEASE SCHEDULE AN APPOINTMENT FOR FURTHER REFILLS   spironolactone  (ALDACTONE ) 25 MG tablet Take 1 tablet (25 mg total) by mouth daily.   timolol  (BETIMOL ) 0.5 % ophthalmic solution Place 1 drop into both eyes daily.   traZODone  (DESYREL ) 50 MG tablet Take 75 mg by mouth at bedtime.   No facility-administered encounter medications on file as of 01/28/2024.    Allergies (verified) Ace inhibitors, Codeine, Lotensin  [benazepril ], Statins, Piroxicam, Simbrinza [brinzolamide-brimonidine], Sprycel  [dasatinib ], and Betadine [povidone iodine ]   History: Past Medical History:  Diagnosis Date   Anemia 06/04/2015   Anxiety    Atrial flutter (HCC)    s/p ablation 07/17/08   Cancer (HCC)    leukemia - dx 05/2018   Chronic diastolic CHF (congestive heart failure) (HCC)    Coronary artery disease    Depression    Diabetes mellitus type 2 in obese 04/10/2009   Qualifier: Diagnosis of  By: Mavis MD, Norleen BRAVO, diet controlled, lost 60lbs   Emphysema of lung (HCC)    GERD (gastroesophageal reflux disease)    Glaucoma    recent dx - has an appt to be evaluated and meds to treat   H/O hiatal hernia    Hearing loss    some hearing loss in left ear but has not been dx, no hearing aids   Hepatitis    h/o of type A   History of measles as a child    History of mumps as a child    Hypertension    Hypothyroidism    Micturition syncope    Osteoarthritis    Pleural effusion, bilateral    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   26 mm Edwards Sapien 3 transcatheter heart valve placed via percutaneous right transfemoral approach    Severe aortic stenosis    Sleep apnea    uses CPAP nightly   Vitamin D  deficiency 11/24/2015   Past Surgical History:  Procedure Laterality Date   CARDIAC CATHETERIZATION  03/18/2019   CARDIAC ELECTROPHYSIOLOGY MAPPING AND ABLATION  06/2008   CARDIAC VALVE REPLACEMENT   04/13/2019   CARDIOVERSION N/A 04/07/2023   Procedure: CARDIOVERSION;  Surgeon: Zenaida Morene PARAS, MD;  Location: [facility] INVASIVE CV LAB;  Service: Cardiovascular;  Laterality: N/A;   CATARACT EXTRACTION W/ INTRAOCULAR LENS  IMPLANT, BILATERAL  ~ 2007   COLONOSCOPY     COLONOSCOPY N/A 11/13/2023   Procedure: COLONOSCOPY;  Surgeon: Stacia Glendia BRAVO, MD;  Location: [facility] ENDOSCOPY;  Service: Gastroenterology;  Laterality: N/A;   ESOPHAGOGASTRODUODENOSCOPY N/A 09/05/2023   Procedure: EGD (ESOPHAGOGASTRODUODENOSCOPY);  Surgeon: Abran Norleen SAILOR, MD;  Location: [facility] ENDOSCOPY;  Service: Gastroenterology;  Laterality: N/A;   ESOPHAGOGASTRODUODENOSCOPY N/A 11/12/2023   Procedure: EGD (ESOPHAGOGASTRODUODENOSCOPY);  Surgeon: Stacia Glendia BRAVO, MD;  Location: [facility];  Service: Gastroenterology;  Laterality:  N/A;   EYE SURGERY     FOOT TENDON SURGERY Left    INTRAOPERATIVE TRANSTHORACIC ECHOCARDIOGRAM N/A 04/13/2019   Procedure: TRANSTHORACIC ECHOCARDIOGRAM;  Surgeon: Wonda Sharper, MD;  Location: [facility] OR;  Service: Open Heart Surgery;  Laterality: N/A;   JOINT REPLACEMENT  2005 /  2010   RIGHT/LEFT HEART CATH AND CORONARY ANGIOGRAPHY N/A 03/18/2019   Procedure: RIGHT/LEFT HEART CATH AND CORONARY ANGIOGRAPHY;  Surgeon: Cherrie Toribio SAUNDERS, MD;  Location: [facility] LAB;  Service: Cardiovascular;  Laterality: N/A;   RIGHT/LEFT HEART CATH AND CORONARY ANGIOGRAPHY N/A 06/19/2023   Procedure: RIGHT/LEFT HEART CATH AND CORONARY ANGIOGRAPHY;  Surgeon: Wonda Sharper, MD;  Location: [facility] INVASIVE CV LAB;  Service: Cardiovascular;  Laterality: N/A;   TONSILLECTOMY  1940's   TOTAL KNEE ARTHROPLASTY  1990's   right   TOTAL KNEE ARTHROPLASTY Left 08/02/2013   Procedure: LEFT TOTAL KNEE ARTHROPLASTY;  Surgeon: Dempsey LULLA Moan, MD;  Location: [facility] ORS;  Service: Orthopedics;  Laterality: Left;   TRANSCATHETER AORTIC VALVE REPLACEMENT, TRANSFEMORAL N/A 04/13/2019   Procedure: TRANSCATHETER AORTIC VALVE REPLACEMENT,  TRANSFEMORAL;  Surgeon: Wonda Sharper, MD;  Location: [facility] OR;  Service: Open Heart Surgery;  Laterality: N/A;   TRANSESOPHAGEAL ECHOCARDIOGRAM (CATH LAB) N/A 04/07/2023   Procedure: TRANSESOPHAGEAL ECHOCARDIOGRAM;  Surgeon: Zenaida Morene PARAS, MD;  [facility] CV LAB;  Service: Cardiovascular;  Laterality: N/A;   UPPER GI ENDOSCOPY     WISDOM TOOTH EXTRACTION     Family History  Problem Relation Age of Onset   Multiple sclerosis Father    Arthritis Father    Hearing loss Father    Heart disease Mother        atrial fibrillation   Hypertension Son    Heart disease Maternal Grandfather    Social History   Socioeconomic History   Marital status: Married    Spouse name: [name]   Number of children: 2   Years of education: Not on file   Highest education level: Not on file  Occupational History   Occupation: Retired  Tobacco Use   Smoking status: Former    Types: Pipe, Cigars    Quit date: 04/01/1996    Years since quitting: 27.8   Smokeless tobacco: Former    Types: Chew    Quit date: 04/01/2009  Vaping Use   Vaping status: Never Used  Substance and Sexual Activity   Alcohol use: Not Currently    Alcohol/week: 0.0 standard drinks of alcohol    Comment: Quit wine early 2020.   Drug use: No   Sexual activity: Not Currently    Comment: lives with wife, eats clean diet. No major dietary restrtictions,   Other Topics Concern   Not on file  Social History Narrative   Live in home with wife.   Social Drivers of Corporate Investment Banker Strain: Low Risk  (01/28/2024)   Overall Financial Resource Strain (CARDIA)    Difficulty of Paying Living Expenses: Not hard at all  Food Insecurity: No Food Insecurity (01/28/2024)   Hunger Vital Sign    Worried About Running Out of Food in the Last Year: Never true    Ran Out of Food in the Last Year: Never true  Transportation Needs: No Transportation Needs (01/28/2024)   PRAPARE - Administrator, Civil Service  (Medical): No    Lack of Transportation (Non-Medical): No  Physical Activity: Insufficiently Active (01/28/2024)   Exercise Vital Sign    Days of Exercise per Week: 7  days    Minutes of Exercise per Session: 20 min  Stress: No Stress Concern Present (01/28/2024)   [employer] of Occupational Health - Occupational Stress Questionnaire    Feeling of Stress: Not at all  Social Connections: Moderately Isolated (01/28/2024)   Social Connection and Isolation Panel    Frequency of Communication with Friends and Family: Twice a week    Frequency of Social Gatherings with Friends and Family: Once a week    Attends Religious Services: Never    Database Administrator or Organizations: No    Attends Engineer, Structural: Never    Marital Status: Married    Tobacco Counseling Counseling given: Yes    Clinical Intake:  Pre-visit preparation completed: Yes  Pain : No/denies pain     BMI - recorded: 28.1 Nutritional Status: BMI 25 -29 Overweight Nutritional Risks: None Diabetes: Yes CBG done?: No Did pt. bring in CBG monitor from home?: No  Lab Results  Component Value Date   HGBA1C 7.3 (H) 09/24/2023   HGBA1C 8.1 11/08/2022   HGBA1C 7.7 (H) 08/06/2021     How often do you need to have someone help you when you read instructions, pamphlets, or other written materials from your doctor or pharmacy?: 1 - Never  Interpreter Needed?: No  Information entered by :: Verdie Saba, CMA   Activities of Daily Living     01/28/2024    1:13 PM 11/11/2023    3:30 PM  In your present state of health, do you have any difficulty performing the following activities:  Hearing? 0 0  Vision? 0 0  Difficulty concentrating or making decisions? 0 0  Walking or climbing stairs? 0   Dressing or bathing? 0   Doing errands, shopping? 0 0  Preparing Food and eating ? N   Using the Toilet? N   In the past six months, have you accidently leaked urine? Y   Comment wears a depend    Do you have problems with loss of bowel control? N   Managing your Medications? N   Managing your Finances? N   Housekeeping or managing your Housekeeping? N     Patient Care Team: Plotnikov, Karlynn GAILS, MD as PCP - General (Internal Medicine) Bensimhon, Toribio SAUNDERS, MD as PCP - Advanced Heart Failure (Cardiology) Vertie Dale CROME, OD as Consulting Physician (Optometry) Bensimhon, Toribio SAUNDERS, MD as Consulting Physician (Cardiology) Melodi Lerner, MD as Consulting Physician (Orthopedic Surgery) Ubaldo Kiang, MD as Consulting Physician (Cardiology) Polly Snellen, MD as Consulting Physician (Family Medicine) Vicenta Sewer, DDS (Dentistry) Wonda Sharper, MD as Consulting Physician (Cardiology) Waddell Danelle ORN, MD as Consulting Physician (Cardiology) Stacia Glendia BRAVO, MD as Consulting Physician (Gastroenterology) Federico Norleen ONEIDA MADISON, MD as Consulting Physician (Hematology and Oncology)  I have updated your Care Teams any recent Medical Services you may have received from other providers in the past year.     Assessment:   This is a routine wellness examination for [place].  Hearing/Vision screen Hearing Screening - Comments:: Denies hearing difficulties   Vision Screening - Comments:: Wears rx glasses - up to date with routine eye exams with the [facility]   Goals Addressed               This Visit's Progress     Patient Stated (pt-stated)        Patient stated he plans to continue eating healthy - include much iron foods       Depression Screen  01/28/2024    1:14 PM 01/22/2024   11:01 AM 11/14/2023   10:35 AM 10/01/2023    9:23 AM 02/20/2023    2:07 PM 05/21/2022   10:09 AM 02/18/2022    1:50 PM  PHQ 2/9 Scores  PHQ - 2 Score 0 0 0 0 0 0 0  PHQ- 9 Score 3    2 0     Fall Risk     01/22/2024   11:01 AM 11/14/2023   10:35 AM 02/20/2023    2:01 PM 05/21/2022   10:08 AM 02/18/2022    1:56 PM  Fall Risk   Falls in the past year? 0 0 0 0 0  Number falls in  past yr: 0 0 0 0 0  Injury with Fall? 0 0 0 0 0  Risk for fall due to : Impaired mobility;Impaired balance/gait Impaired mobility;Medication side effect No Fall Risks No Fall Risks No Fall Risks  Follow up Falls evaluation completed Education provided;Falls prevention discussed Falls prevention discussed;Falls evaluation completed Falls evaluation completed Falls prevention discussed      Data saved with a previous flowsheet row definition    MEDICARE RISK AT HOME:  Medicare Risk at Home Any stairs in or around the home?: No If so, are there any without handrails?: No Home free of loose throw rugs in walkways, pet beds, electrical cords, etc?: Yes Adequate lighting in your home to reduce risk of falls?: Yes Life alert?: Yes (alarm system) Use of a cane, walker or w/c?: No Grab bars in the bathroom?: Yes Shower chair or bench in shower?: Yes Elevated toilet seat or a handicapped toilet?: No  TIMED UP AND GO:  Was the test performed?  No  Cognitive Function: 6CIT completed    07/29/2016    9:05 AM  MMSE - Mini Mental State Exam  Orientation to time 5   Orientation to Place 5   Registration 3   Attention/ Calculation 4   Recall 3   Language- name 2 objects 2   Language- repeat 1  Language- follow 3 step command 3   Language- read & follow direction 1   Write a sentence 1   Copy design 1   Total score 29      Data saved with a previous flowsheet row definition        01/28/2024    1:16 PM 02/20/2023    2:03 PM 12/22/2019   11:13 AM  6CIT Screen  What Year? 0 points 0 points 0 points  What month? 0 points 0 points 0 points  What time? 0 points 0 points 0 points  Count back from 20 0 points 0 points 0 points  Months in reverse 4 points 4 points 0 points  Repeat phrase 2 points 0 points 0 points  Total Score 6 points 4 points 0 points    Immunizations Immunization History  Administered Date(s) Administered   Fluad Quad(high Dose 65+) 01/20/2020, 01/31/2020,  03/15/2021   INFLUENZA, HIGH DOSE SEASONAL PF 12/05/2010, 12/31/2011, 01/12/2013, 11/24/2015, 02/12/2016, 01/10/2017, 12/30/2017, 12/17/2018   Influenza Split 12/31/2011   Influenza Whole 01/11/2009, 02/08/2010   Influenza,inj,Quad PF,6+ Mos 02/01/2014, 12/15/2014   Influenza-Unspecified 12/30/2001, 12/31/2002, 12/31/2003, 01/30/2005, 02/08/2005, 01/20/2006, 12/18/2006, 01/31/2008, 11/30/2008, 12/18/2017, 12/19/2018, 01/01/2021, 01/18/2022   Moderna Covid-19 Fall Seasonal Vaccine 30yrs & older 03/06/2023   Moderna Sars-Covid-2 Vaccination 05/04/2019, 06/02/2019, 01/20/2020   Pneumococcal Conjugate-13 11/24/2015, 10/10/2016   Pneumococcal Polysaccharide-23 10/29/2008   Pneumococcal-Unspecified 12/23/2003   Td 11/15/2002, 04/30/2008   Td (Adult),unspecified  11/15/2002   Tdap 09/25/2011    Screening Tests Health Maintenance  Topic Date Due   FOOT EXAM  07/29/2017   DTaP/Tdap/Td (5 - Td or Tdap) 09/24/2021   Influenza Vaccine  10/31/2023   COVID-19 Vaccine (5 - 2025-26 season) 02/13/2024 (Originally 12/01/2023)   Zoster Vaccines- Shingrix (1 of 2) 04/29/2024 (Originally 06/08/1954)   HEMOGLOBIN A1C  03/25/2024   OPHTHALMOLOGY EXAM  08/05/2024   Medicare Annual Wellness (AWV)  01/27/2025   Pneumococcal Vaccine: 50+ Years  Completed   Meningococcal B Vaccine  Aged Out    Health Maintenance Items Addressed:  I have recommended that this patient have a immunization for COVID and Shingles but he declines at this time. I have discussed the risks and benefits of this procedure with him. The patient verbalizes understanding.   Additional Screening:  Vision Screening: Recommended annual ophthalmology exams for early detection of glaucoma and other disorders of the eye. Is the patient up to date with their annual eye exam?  Yes  Who is the provider or what [facility] the [facility]  Dental Screening: Recommended annual dental exams  for proper oral hygiene  Community Resource Referral / Chronic Care Management: CRR required this visit?  No   CCM required this visit?  No   Plan:    I have personally reviewed and noted the following in the patient's chart:   Medical and social history Use of alcohol, tobacco or illicit drugs  Current medications and supplements including opioid prescriptions. Patient is not currently taking opioid prescriptions. Functional ability and status Nutritional status Physical activity Advanced directives List of other physicians Hospitalizations, surgeries, and ER visits in previous 12 months Vitals Screenings to include cognitive, depression, and falls Referrals and appointments  In addition, I have reviewed and discussed with patient certain preventive protocols, quality metrics, and best practice recommendations. A written personalized care plan for preventive services as well as general preventive health recommendations were provided to patient.   Verdie CHRISTELLA Saba, CMA   01/28/2024   After Visit Summary: (MyChart) Due to this being a telephonic visit, the after visit summary with patients personalized plan was offered to patient via MyChart   Notes: Nothing significant to report at this time.  Medical screening examination/treatment/procedure(s) were performed by non-physician practitioner and as supervising physician I was immediately available for consultation/collaboration.  I agree with above. Karlynn Noel, MD

## 2024-01-28 NOTE — Patient Instructions (Addendum)
 [name],  Thank you for taking the time for your Medicare Wellness Visit. I appreciate your continued commitment to your health goals. Please review the care plan we discussed, and feel free to reach out if I can assist you further.  Medicare recommends these wellness visits once per year to help you and your care team stay ahead of potential health issues. These visits are designed to focus on prevention, allowing your provider to concentrate on managing your acute and chronic conditions during your regular appointments.  Please note that Annual Wellness Visits do not include a physical exam. Some assessments may be limited, especially if the visit was conducted virtually. If needed, we may recommend a separate in-person follow-up with your provider.  Ongoing Care Seeing your primary care provider every 3 to 6 months helps us  monitor your health and provide consistent, personalized care.   Referrals If a referral was made during today's visit and you haven't received any updates within two weeks, please contact the referred provider directly to check on the status.  Recommended Screenings:  Health Maintenance  Topic Date Due   Complete foot exam   07/29/2017   DTaP/Tdap/Td vaccine (5 - Td or Tdap) 09/24/2021   Flu Shot  10/31/2023   COVID-19 Vaccine (5 - 2025-26 season) 02/13/2024*   Zoster (Shingles) Vaccine (1 of 2) 04/29/2024*   Hemoglobin A1C  03/25/2024   Eye exam for diabetics  08/05/2024   Medicare Annual Wellness Visit  01/27/2025   Pneumococcal Vaccine for age over 52  Completed   Meningitis B Vaccine  Aged Out  *Topic was postponed. The date shown is not the original due date.       01/28/2024    1:12 PM  Advanced Directives  Does Patient Have a Medical Advance Directive? Yes  Type of Estate Agent of [place];Living will  Copy of Healthcare Power of Attorney in Chart? No - copy requested   Advance Care Planning is important because  it: Ensures you receive medical care that aligns with your values, goals, and preferences. Provides guidance to your family and loved ones, reducing the emotional burden of decision-making during critical moments.  Vision: Annual vision screenings are recommended for early detection of glaucoma, cataracts, and diabetic retinopathy. These exams can also reveal signs of chronic conditions such as diabetes and high blood pressure.  Dental: Annual dental screenings help detect early signs of oral cancer, gum disease, and other conditions linked to overall health, including heart disease and diabetes.

## 2024-02-08 ENCOUNTER — Other Ambulatory Visit: Payer: Self-pay | Admitting: Physician Assistant

## 2024-02-23 ENCOUNTER — Ambulatory Visit (HOSPITAL_COMMUNITY)
Admission: RE | Admit: 2024-02-23 | Discharge: 2024-02-23 | Disposition: A | Discharge status: Home / Self Care | Source: Ambulatory Visit | Attending: Cardiology | Admitting: Cardiology

## 2024-02-23 ENCOUNTER — Encounter (HOSPITAL_COMMUNITY): Payer: Self-pay | Admitting: Cardiology

## 2024-02-23 VITALS — BP 144/80 | HR 83 | Wt 225.6 lb

## 2024-02-23 DIAGNOSIS — I340 Nonrheumatic mitral (valve) insufficiency: Secondary | ICD-10-CM | POA: Insufficient documentation

## 2024-02-23 DIAGNOSIS — D509 Iron deficiency anemia, unspecified: Secondary | ICD-10-CM | POA: Diagnosis not present

## 2024-02-23 DIAGNOSIS — G4730 Sleep apnea, unspecified: Secondary | ICD-10-CM | POA: Insufficient documentation

## 2024-02-23 DIAGNOSIS — E669 Obesity, unspecified: Secondary | ICD-10-CM | POA: Insufficient documentation

## 2024-02-23 DIAGNOSIS — E1122 Type 2 diabetes mellitus with diabetic chronic kidney disease: Secondary | ICD-10-CM | POA: Diagnosis not present

## 2024-02-23 DIAGNOSIS — I2510 Atherosclerotic heart disease of native coronary artery without angina pectoris: Secondary | ICD-10-CM | POA: Diagnosis not present

## 2024-02-23 DIAGNOSIS — I482 Chronic atrial fibrillation, unspecified: Secondary | ICD-10-CM | POA: Diagnosis not present

## 2024-02-23 DIAGNOSIS — C9210 Chronic myeloid leukemia, BCR/ABL-positive, not having achieved remission: Secondary | ICD-10-CM | POA: Insufficient documentation

## 2024-02-23 DIAGNOSIS — R001 Bradycardia, unspecified: Secondary | ICD-10-CM | POA: Diagnosis not present

## 2024-02-23 DIAGNOSIS — E785 Hyperlipidemia, unspecified: Secondary | ICD-10-CM | POA: Insufficient documentation

## 2024-02-23 DIAGNOSIS — I2722 Pulmonary hypertension due to left heart disease: Secondary | ICD-10-CM | POA: Insufficient documentation

## 2024-02-23 DIAGNOSIS — Z952 Presence of prosthetic heart valve: Secondary | ICD-10-CM | POA: Insufficient documentation

## 2024-02-23 DIAGNOSIS — Z79899 Other long term (current) drug therapy: Secondary | ICD-10-CM | POA: Diagnosis not present

## 2024-02-23 DIAGNOSIS — D631 Anemia in chronic kidney disease: Secondary | ICD-10-CM | POA: Diagnosis not present

## 2024-02-23 DIAGNOSIS — I4821 Permanent atrial fibrillation: Secondary | ICD-10-CM | POA: Diagnosis not present

## 2024-02-23 DIAGNOSIS — Z7901 Long term (current) use of anticoagulants: Secondary | ICD-10-CM | POA: Insufficient documentation

## 2024-02-23 DIAGNOSIS — I5032 Chronic diastolic (congestive) heart failure: Secondary | ICD-10-CM | POA: Diagnosis present

## 2024-02-23 DIAGNOSIS — I130 Hypertensive heart and chronic kidney disease with heart failure and stage 1 through stage 4 chronic kidney disease, or unspecified chronic kidney disease: Secondary | ICD-10-CM | POA: Diagnosis not present

## 2024-02-23 DIAGNOSIS — N1832 Chronic kidney disease, stage 3b: Secondary | ICD-10-CM | POA: Diagnosis not present

## 2024-02-23 DIAGNOSIS — I6523 Occlusion and stenosis of bilateral carotid arteries: Secondary | ICD-10-CM | POA: Diagnosis not present

## 2024-02-23 NOTE — Patient Instructions (Signed)
 STOP Amlodipine .  CHANGE Lasix  to 40 mg Twice daily for 3 days ONLY, then go back to 40 mg daily.  Your physician recommends that you schedule a follow-up appointment in: 5 months ( April, 2026) ** PLEASE CALL THE OFFICE IN [place] TO ARRANGE YOUR FOLLOW UP APPOINTMENT.**  If you have any questions or concerns before your next appointment please send us  a message through [place] or call our office at [phone].    TO LEAVE A MESSAGE FOR THE NURSE SELECT OPTION 2, PLEASE LEAVE A MESSAGE INCLUDING: YOUR NAME DATE OF BIRTH CALL BACK NUMBER REASON FOR CALL**this is important as we prioritize the call backs  YOU WILL RECEIVE A CALL BACK THE SAME DAY AS LONG AS YOU CALL BEFORE 4:00 PM  At the [facility], you and your health needs are our priority. As part of our continuing mission to provide you with exceptional heart care, we have created designated Provider Care Teams. These Care Teams include your primary Cardiologist (physician) and Advanced Practice Providers (APPs- Physician Assistants and Nurse Practitioners) who all work together to provide you with the care you need, when you need it.   You may see any of the following providers on your designated Care Team at your next follow up: Dr Toribio Fuel Dr Ezra Shuck Dr. [name] Greig Mosses, NP Caffie Shed, [place] [facility] [place], [place] Beckey Coe, NP Jordan Lee, NP Ellouise Class, NP Tinnie Redman, PharmD Jaun Bash, PharmD   Please be sure to bring in all your medications bottles to every appointment.    Thank you for choosing [facility]

## 2024-02-25 NOTE — Progress Notes (Signed)
 ADVANCED HEART FAILURE FOLLOW UP CLINIC NOTE  Referring Physician: [name], MD  Primary Care: [name] Karlynn GAILS, MD Primary Cardiologist:  HPI: [name] White is a 88 y.o. male with a past medical history of nonobstructive CAD, hypertension, hyperlipidemia, obesity, sleep apnea, severe aortic stenosis s/p TAVR 04/2019, atrial flutter s/p ablation who presents for follow up of chronic diastolic heart failure.      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 Being treated at [facility]. Has lost over 40 pounds. BP down so clonidine  stopped.    Echo 10/20 EF 55-60% RV ok. Severe AS mean gradient Small effusion. Mild to mod MR/TR. S/p TAVR on 04/13/19.  Echo 05/17/19 EF 60-65%. TAVR ok. RV ok. Mod TR   Echo 01/17/21: EF 60-65% TAVR stable. Severe biatrial enlargement.   Underwent TEE/RHC for worsening mitral regurgitation noted on echocardiogram. Evidence of severe MR by imaging and RHC. Symptoms minimal, and given significant posterior calcification with small leaflet elected to hold off at this time     SUBJECTIVE:  Overall doing well, no active complaints today. His weight is slightly up but denies any orthopnea or worsening shortness of breath, has been taking his medications as prescribed. Denies any further bleeding episodes, occasionally needing IV iron.   PMH, current medications, allergies, social history, and family history reviewed in epic.  PHYSICAL EXAM: Vitals:   02/23/24 1345  BP: (!) 144/80  Pulse: 83  SpO2: 97%     GENERAL: NAD, elderly appearing PULM:  Normal work of breathing, CTAB CARDIAC:  JVP: flat         Normal rate with regular rhythm. Systolic murmur, blowing, at the apex, systolic murmur at the RUSB, trace edema. Warm and well perfused extremities. ABDOMEN: Soft, non-tender, non-distended. NEUROLOGIC: Patient is oriented x3 with no focal or lateralizing neurologic deficits.     DATA REVIEW   ECHO: Echo 10/20 EF 55-60% RV ok. Severe  AS mean gradient Small effusion. Mild to mod MR/TR. S/p TAVR on 04/13/19.  Echo 05/17/19 EF 60-65%. TAVR ok. RV ok. Mod TR   Echo 01/17/21: EF 60-65% TAVR stable. Severe biatrial enlargement.  03/2023: Normal ejection fraction, appropriately performing bioprosthetic aortic valve, worsening pulmonary artery pressures and likely new severe mitral regurgitation, eccentric jet that is not clearly seen on previous echocardiogram 10/2023:LVEF 55-60%, severe MR, RV mildly reduced, severe PH  CATH: R/L cath 12/20 Prox RCA to Mid RCA lesion is 30% stenosed. RPAV lesion is 30% stenosed. Ost Cx to Prox Cx lesion is 50% stenosed. Prox LAD to Mid LAD lesion is 20% stenosed  RA: 5, PA: 37/13 (22): PCWP 14, Fick cardiac output 9.8, index 4.3 but no evidence of shunt  06/19/23: RA18, PA 46/19 (31), PCWP 21 with v waves to 34, Fick CO/CI 7.32/3.2, mild nonobstructive disease  ReDs reading: 37 %, abnormal, mildly overloaded    ASSESSMENT & PLAN:  Mitral regurgitation: Severe MR, workup including TEE and RHC. Discussed with structural team, given minimal symptoms and calfied leaflet they had elected to hold off, patient not interested now in further invasive procedures unless his symptoms were to significantly worse. - Continue lasix  40mg  daily, discussed increasing to BID for further weight gain, potentially over Thanksgiving -No need for further structural evaluation  Chronic diastolic heart failure: Preserved ejection fraction in the setting of severe MR.   -Continue jardiance  12.5mg  daily - Continue spironolactone  25mg  daily -Continue Lasix  as above  Severe AS: TAVR 1/21.  Well-functioning valve on echocardiogram, will  need SBE prophylaxis. -Yearly echoes for monitoring  Hypertension: Mildly elevated today but better at home. Continue to stay off amlodipine .   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 - Treatment with diuretics  CAD - No current s/s ischemia.  - cath 05/2023  with stable non-obstructive CAD - Continue Crestor , Not on ASA due to Eliquis . Not on BB with bradycardia   CML - Following with Dr. Vertell White at [facility] - On nilotinib    Atrial fibrillation, permanent - Tikosyn  stopped 5/17 due to persistent AF. - S/p successful cardioversion 03/2023, unfortunately back in afib - Continue Eliquis  5 BID   Iron deficiency anemia: Noted recently, iron deficient despite iron infusion. Have discussed Watchman in the past, would like to hold off.    Carotid Stenosis - Carotid duplex 12/2013; stable 0-39% bilateral ICA stenosis - Continue Crestor  - F/u as needed   DM2 - Continue Jardiance    CKD 3b -Stable on most recent set of labs    [name] Brownie, MD Advanced Heart Failure Mechanical Circulatory Support 02/25/24

## 2024-03-12 ENCOUNTER — Telehealth (HOSPITAL_COMMUNITY): Payer: Self-pay | Admitting: Cardiology

## 2024-03-12 NOTE — Telephone Encounter (Signed)
 Patient called to report increase in b/p readings Reports he has noticed increase over the past few weeks/ report normally its low  Can only recall SBP 147  DENIES CP HA VISUAL CHANGES Reports he only feels woozy and mildly SOB  Reports he is currently under treatment for bladder issue, unsure if the stress of other health issues could cause increase in b/p   New medication :doxycycline   Please advise

## 2024-03-15 MED ORDER — AMLODIPINE BESYLATE 2.5 MG PO TABS: 2.5000 mg | ORAL_TABLET | Freq: Every day | ORAL | 3 refills | Status: AC

## 2024-03-15 NOTE — Telephone Encounter (Signed)
Pt aware & voiced understanding 

## 2024-04-09 ENCOUNTER — Other Ambulatory Visit: Payer: Self-pay | Admitting: Internal Medicine

## 2024-05-24 ENCOUNTER — Ambulatory Visit: Admitting: Internal Medicine

## 2024-05-31 ENCOUNTER — Encounter: Payer: Medicare Other | Admitting: Internal Medicine

## 2025-01-28 ENCOUNTER — Ambulatory Visit
